# Patient Record
Sex: Female | Born: 1955 | Race: White | Hispanic: No | Marital: Married | Smoking: Never smoker
Health system: Southern US, Community
[De-identification: ages and names within clinical notes are randomized; demographics above are authoritative.]

## PROBLEM LIST (undated history)

## (undated) DIAGNOSIS — Z9221 Personal history of antineoplastic chemotherapy: Secondary | ICD-10-CM

## (undated) DIAGNOSIS — Z923 Personal history of irradiation: Secondary | ICD-10-CM

## (undated) DIAGNOSIS — G8929 Other chronic pain: Secondary | ICD-10-CM

## (undated) DIAGNOSIS — Z8489 Family history of other specified conditions: Secondary | ICD-10-CM

## (undated) DIAGNOSIS — A0472 Enterocolitis due to Clostridium difficile, not specified as recurrent: Secondary | ICD-10-CM

## (undated) DIAGNOSIS — E119 Type 2 diabetes mellitus without complications: Secondary | ICD-10-CM

## (undated) DIAGNOSIS — Z9889 Other specified postprocedural states: Secondary | ICD-10-CM

## (undated) DIAGNOSIS — G43909 Migraine, unspecified, not intractable, without status migrainosus: Secondary | ICD-10-CM

## (undated) DIAGNOSIS — M545 Low back pain, unspecified: Secondary | ICD-10-CM

## (undated) DIAGNOSIS — R1310 Dysphagia, unspecified: Secondary | ICD-10-CM

## (undated) DIAGNOSIS — J45909 Unspecified asthma, uncomplicated: Secondary | ICD-10-CM

## (undated) DIAGNOSIS — R011 Cardiac murmur, unspecified: Secondary | ICD-10-CM

## (undated) DIAGNOSIS — I480 Paroxysmal atrial fibrillation: Secondary | ICD-10-CM

## (undated) DIAGNOSIS — C50919 Malignant neoplasm of unspecified site of unspecified female breast: Secondary | ICD-10-CM

## (undated) DIAGNOSIS — E7800 Pure hypercholesterolemia, unspecified: Secondary | ICD-10-CM

## (undated) DIAGNOSIS — E1169 Type 2 diabetes mellitus with other specified complication: Secondary | ICD-10-CM

## (undated) DIAGNOSIS — R112 Nausea with vomiting, unspecified: Secondary | ICD-10-CM

## (undated) DIAGNOSIS — N1330 Unspecified hydronephrosis: Secondary | ICD-10-CM

## (undated) DIAGNOSIS — R4182 Altered mental status, unspecified: Secondary | ICD-10-CM

## (undated) DIAGNOSIS — B999 Unspecified infectious disease: Secondary | ICD-10-CM

## (undated) DIAGNOSIS — J42 Unspecified chronic bronchitis: Secondary | ICD-10-CM

## (undated) DIAGNOSIS — K219 Gastro-esophageal reflux disease without esophagitis: Secondary | ICD-10-CM

## (undated) DIAGNOSIS — R0682 Tachypnea, not elsewhere classified: Secondary | ICD-10-CM

## (undated) DIAGNOSIS — I5022 Chronic systolic (congestive) heart failure: Secondary | ICD-10-CM

## (undated) DIAGNOSIS — N200 Calculus of kidney: Secondary | ICD-10-CM

## (undated) DIAGNOSIS — I499 Cardiac arrhythmia, unspecified: Secondary | ICD-10-CM

## (undated) DIAGNOSIS — M1990 Unspecified osteoarthritis, unspecified site: Secondary | ICD-10-CM

## (undated) DIAGNOSIS — I509 Heart failure, unspecified: Secondary | ICD-10-CM

## (undated) DIAGNOSIS — N390 Urinary tract infection, site not specified: Secondary | ICD-10-CM

## (undated) DIAGNOSIS — G44209 Tension-type headache, unspecified, not intractable: Secondary | ICD-10-CM

## (undated) DIAGNOSIS — J189 Pneumonia, unspecified organism: Secondary | ICD-10-CM

## (undated) DIAGNOSIS — E669 Obesity, unspecified: Secondary | ICD-10-CM

## (undated) DIAGNOSIS — A419 Sepsis, unspecified organism: Secondary | ICD-10-CM

## (undated) HISTORY — DX: Type 2 diabetes mellitus with other specified complication: E11.69

## (undated) HISTORY — DX: Urinary tract infection, site not specified: N39.0

## (undated) HISTORY — DX: Paroxysmal atrial fibrillation: I48.0

## (undated) HISTORY — DX: Unspecified infectious disease: B99.9

## (undated) HISTORY — DX: Type 2 diabetes mellitus without complications: E11.9

## (undated) HISTORY — PX: KNEE ARTHROSCOPY: SUR90

## (undated) HISTORY — DX: Enterocolitis due to Clostridium difficile, not specified as recurrent: A04.72

## (undated) HISTORY — DX: Altered mental status, unspecified: R41.82

## (undated) HISTORY — DX: Tachypnea, not elsewhere classified: R06.82

## (undated) HISTORY — DX: Chronic systolic (congestive) heart failure: I50.22

## (undated) HISTORY — DX: Hypomagnesemia: E83.42

## (undated) HISTORY — PX: LITHOTRIPSY: SUR834

## (undated) HISTORY — DX: Unspecified hydronephrosis: N13.30

## (undated) HISTORY — PX: DIAGNOSTIC LAPAROSCOPY: SUR761

## (undated) HISTORY — DX: Type 2 diabetes mellitus with other specified complication: E66.9

## (undated) HISTORY — PX: COLONOSCOPY: SHX174

## (undated) HISTORY — DX: Dysphagia, unspecified: R13.10

---

## 1983-07-05 HISTORY — PX: TUBAL LIGATION: SHX77

## 1997-12-04 ENCOUNTER — Ambulatory Visit (HOSPITAL_COMMUNITY): Admission: RE | Admit: 1997-12-04 | Discharge: 1997-12-04 | Payer: Self-pay | Admitting: Urology

## 1998-08-27 ENCOUNTER — Ambulatory Visit (HOSPITAL_COMMUNITY): Admission: RE | Admit: 1998-08-27 | Discharge: 1998-08-27 | Payer: Self-pay | Admitting: Urology

## 1998-08-27 ENCOUNTER — Encounter: Payer: Self-pay | Admitting: Urology

## 1998-12-24 ENCOUNTER — Other Ambulatory Visit: Admission: RE | Admit: 1998-12-24 | Discharge: 1998-12-24 | Payer: Self-pay | Admitting: Gynecology

## 1999-05-25 ENCOUNTER — Encounter: Admission: RE | Admit: 1999-05-25 | Discharge: 1999-05-25 | Payer: Self-pay

## 2000-02-18 ENCOUNTER — Encounter: Admission: RE | Admit: 2000-02-18 | Discharge: 2000-02-18 | Payer: Self-pay

## 2000-10-03 ENCOUNTER — Encounter: Payer: Self-pay | Admitting: Emergency Medicine

## 2000-10-03 ENCOUNTER — Emergency Department (HOSPITAL_COMMUNITY): Admission: EM | Admit: 2000-10-03 | Discharge: 2000-10-03 | Payer: Self-pay | Admitting: Emergency Medicine

## 2001-01-23 ENCOUNTER — Other Ambulatory Visit: Admission: RE | Admit: 2001-01-23 | Discharge: 2001-01-23 | Payer: Self-pay | Admitting: Gynecology

## 2001-02-01 HISTORY — PX: BREAST LUMPECTOMY: SHX2

## 2001-02-01 HISTORY — PX: BREAST BIOPSY: SHX20

## 2001-02-19 ENCOUNTER — Encounter: Admission: RE | Admit: 2001-02-19 | Discharge: 2001-02-19 | Payer: Self-pay | Admitting: Gynecology

## 2001-02-19 ENCOUNTER — Encounter: Payer: Self-pay | Admitting: Gynecology

## 2001-02-21 ENCOUNTER — Encounter: Payer: Self-pay | Admitting: Gynecology

## 2001-02-21 ENCOUNTER — Encounter: Admission: RE | Admit: 2001-02-21 | Discharge: 2001-02-21 | Payer: Self-pay | Admitting: Gynecology

## 2001-03-01 ENCOUNTER — Encounter: Admission: RE | Admit: 2001-03-01 | Discharge: 2001-03-01 | Payer: Self-pay | Admitting: General Surgery

## 2001-03-01 ENCOUNTER — Encounter: Payer: Self-pay | Admitting: General Surgery

## 2001-03-02 ENCOUNTER — Encounter: Admission: RE | Admit: 2001-03-02 | Discharge: 2001-03-02 | Payer: Self-pay | Admitting: General Surgery

## 2001-03-02 ENCOUNTER — Encounter (INDEPENDENT_AMBULATORY_CARE_PROVIDER_SITE_OTHER): Payer: Self-pay | Admitting: *Deleted

## 2001-03-02 ENCOUNTER — Ambulatory Visit (HOSPITAL_BASED_OUTPATIENT_CLINIC_OR_DEPARTMENT_OTHER): Admission: RE | Admit: 2001-03-02 | Discharge: 2001-03-02 | Payer: Self-pay | Admitting: General Surgery

## 2001-03-02 ENCOUNTER — Encounter: Payer: Self-pay | Admitting: General Surgery

## 2001-03-07 ENCOUNTER — Emergency Department (HOSPITAL_COMMUNITY): Admission: EM | Admit: 2001-03-07 | Discharge: 2001-03-07 | Payer: Self-pay | Admitting: Emergency Medicine

## 2001-03-07 ENCOUNTER — Encounter: Payer: Self-pay | Admitting: Urology

## 2001-03-07 ENCOUNTER — Encounter: Admission: RE | Admit: 2001-03-07 | Discharge: 2001-03-07 | Payer: Self-pay | Admitting: Urology

## 2001-03-13 ENCOUNTER — Ambulatory Visit: Admission: RE | Admit: 2001-03-13 | Discharge: 2001-06-11 | Payer: Self-pay | Admitting: *Deleted

## 2001-03-29 ENCOUNTER — Ambulatory Visit (HOSPITAL_BASED_OUTPATIENT_CLINIC_OR_DEPARTMENT_OTHER): Admission: RE | Admit: 2001-03-29 | Discharge: 2001-03-29 | Payer: Self-pay | Admitting: General Surgery

## 2001-03-29 ENCOUNTER — Encounter: Payer: Self-pay | Admitting: General Surgery

## 2001-07-12 ENCOUNTER — Encounter: Admission: RE | Admit: 2001-07-12 | Discharge: 2001-07-12 | Payer: Self-pay | Admitting: General Surgery

## 2001-07-12 ENCOUNTER — Encounter: Payer: Self-pay | Admitting: General Surgery

## 2001-07-26 ENCOUNTER — Ambulatory Visit: Admission: RE | Admit: 2001-07-26 | Discharge: 2001-10-24 | Payer: Self-pay | Admitting: *Deleted

## 2001-08-21 ENCOUNTER — Encounter: Admission: RE | Admit: 2001-08-21 | Discharge: 2001-08-21 | Payer: Self-pay | Admitting: Hematology & Oncology

## 2001-08-21 ENCOUNTER — Encounter: Payer: Self-pay | Admitting: Hematology & Oncology

## 2001-08-24 ENCOUNTER — Ambulatory Visit (HOSPITAL_BASED_OUTPATIENT_CLINIC_OR_DEPARTMENT_OTHER): Admission: RE | Admit: 2001-08-24 | Discharge: 2001-08-24 | Payer: Self-pay | Admitting: General Surgery

## 2001-11-28 ENCOUNTER — Encounter: Payer: Self-pay | Admitting: General Surgery

## 2001-11-28 ENCOUNTER — Encounter: Admission: RE | Admit: 2001-11-28 | Discharge: 2001-11-28 | Payer: Self-pay | Admitting: General Surgery

## 2002-01-28 ENCOUNTER — Other Ambulatory Visit: Admission: RE | Admit: 2002-01-28 | Discharge: 2002-01-28 | Payer: Self-pay | Admitting: Gynecology

## 2002-02-26 ENCOUNTER — Encounter: Payer: Self-pay | Admitting: General Surgery

## 2002-02-26 ENCOUNTER — Encounter: Admission: RE | Admit: 2002-02-26 | Discharge: 2002-02-26 | Payer: Self-pay | Admitting: General Surgery

## 2002-07-15 ENCOUNTER — Encounter: Admission: RE | Admit: 2002-07-15 | Discharge: 2002-07-15 | Payer: Self-pay | Admitting: General Surgery

## 2002-07-15 ENCOUNTER — Encounter: Payer: Self-pay | Admitting: General Surgery

## 2002-10-16 ENCOUNTER — Encounter: Admission: RE | Admit: 2002-10-16 | Discharge: 2002-10-16 | Payer: Self-pay | Admitting: General Surgery

## 2002-10-16 ENCOUNTER — Encounter: Payer: Self-pay | Admitting: General Surgery

## 2003-02-17 ENCOUNTER — Other Ambulatory Visit: Admission: RE | Admit: 2003-02-17 | Discharge: 2003-02-17 | Payer: Self-pay | Admitting: Gynecology

## 2003-03-05 ENCOUNTER — Encounter: Payer: Self-pay | Admitting: General Surgery

## 2003-03-05 ENCOUNTER — Encounter: Admission: RE | Admit: 2003-03-05 | Discharge: 2003-03-05 | Payer: Self-pay | Admitting: General Surgery

## 2003-03-12 ENCOUNTER — Encounter (HOSPITAL_COMMUNITY): Admission: RE | Admit: 2003-03-12 | Discharge: 2003-06-10 | Payer: Self-pay | Admitting: General Surgery

## 2003-03-12 ENCOUNTER — Encounter: Payer: Self-pay | Admitting: General Surgery

## 2003-06-12 ENCOUNTER — Observation Stay (HOSPITAL_COMMUNITY): Admission: AD | Admit: 2003-06-12 | Discharge: 2003-06-13 | Payer: Self-pay | Admitting: Gynecology

## 2003-06-12 ENCOUNTER — Encounter (INDEPENDENT_AMBULATORY_CARE_PROVIDER_SITE_OTHER): Payer: Self-pay | Admitting: Specialist

## 2003-06-12 ENCOUNTER — Ambulatory Visit (HOSPITAL_BASED_OUTPATIENT_CLINIC_OR_DEPARTMENT_OTHER): Admission: RE | Admit: 2003-06-12 | Discharge: 2003-06-12 | Payer: Self-pay | Admitting: Gynecology

## 2003-07-05 DIAGNOSIS — A419 Sepsis, unspecified organism: Secondary | ICD-10-CM

## 2003-07-05 HISTORY — DX: Sepsis, unspecified organism: A41.9

## 2003-11-10 ENCOUNTER — Emergency Department (HOSPITAL_COMMUNITY): Admission: EM | Admit: 2003-11-10 | Discharge: 2003-11-10 | Payer: Self-pay | Admitting: Emergency Medicine

## 2004-04-22 ENCOUNTER — Encounter: Admission: RE | Admit: 2004-04-22 | Discharge: 2004-04-22 | Payer: Self-pay | Admitting: Hematology & Oncology

## 2004-06-08 ENCOUNTER — Other Ambulatory Visit: Admission: RE | Admit: 2004-06-08 | Discharge: 2004-06-08 | Payer: Self-pay | Admitting: Gynecology

## 2004-07-04 HISTORY — PX: BILATERAL OOPHORECTOMY: SHX1221

## 2004-10-15 ENCOUNTER — Ambulatory Visit: Payer: Self-pay | Admitting: Hematology & Oncology

## 2004-12-28 ENCOUNTER — Ambulatory Visit: Payer: Self-pay | Admitting: Hematology & Oncology

## 2005-01-17 ENCOUNTER — Encounter: Admission: RE | Admit: 2005-01-17 | Discharge: 2005-01-17 | Payer: Self-pay | Admitting: Unknown Physician Specialty

## 2005-01-18 ENCOUNTER — Encounter: Admission: RE | Admit: 2005-01-18 | Discharge: 2005-01-18 | Payer: Self-pay | Admitting: Unknown Physician Specialty

## 2005-04-25 ENCOUNTER — Encounter: Admission: RE | Admit: 2005-04-25 | Discharge: 2005-04-25 | Payer: Self-pay | Admitting: General Surgery

## 2005-04-25 ENCOUNTER — Encounter: Admission: RE | Admit: 2005-04-25 | Discharge: 2005-04-25 | Payer: Self-pay | Admitting: Hematology & Oncology

## 2005-06-28 ENCOUNTER — Ambulatory Visit: Payer: Self-pay | Admitting: Hematology & Oncology

## 2005-08-31 ENCOUNTER — Other Ambulatory Visit: Admission: RE | Admit: 2005-08-31 | Discharge: 2005-08-31 | Payer: Self-pay | Admitting: Gynecology

## 2006-04-27 ENCOUNTER — Encounter: Admission: RE | Admit: 2006-04-27 | Discharge: 2006-04-27 | Payer: Self-pay | Admitting: General Surgery

## 2006-10-26 ENCOUNTER — Other Ambulatory Visit: Admission: RE | Admit: 2006-10-26 | Discharge: 2006-10-26 | Payer: Self-pay | Admitting: Gynecology

## 2007-03-02 ENCOUNTER — Ambulatory Visit: Payer: Self-pay | Admitting: Hematology & Oncology

## 2007-03-07 LAB — CBC WITH DIFFERENTIAL/PLATELET
Eosinophils Absolute: 0.2 10*3/uL (ref 0.0–0.5)
LYMPH%: 26.7 % (ref 14.0–48.0)
MCHC: 35.6 g/dL (ref 32.0–36.0)
MONO#: 0.4 10*3/uL (ref 0.1–0.9)
MONO%: 5.2 % (ref 0.0–13.0)
NEUT%: 64.7 % (ref 39.6–76.8)
Platelets: 251 10*3/uL (ref 145–400)
RBC: 4.03 10*6/uL (ref 3.70–5.32)
WBC: 6.8 10*3/uL (ref 3.9–10.0)

## 2007-03-07 LAB — COMPREHENSIVE METABOLIC PANEL
Alkaline Phosphatase: 58 U/L (ref 39–117)
BUN: 20 mg/dL (ref 6–23)
Calcium: 9.6 mg/dL (ref 8.4–10.5)
Glucose, Bld: 211 mg/dL — ABNORMAL HIGH (ref 70–99)
Potassium: 4.3 mEq/L (ref 3.5–5.3)

## 2007-06-14 ENCOUNTER — Encounter: Admission: RE | Admit: 2007-06-14 | Discharge: 2007-06-14 | Payer: Self-pay | Admitting: Hematology & Oncology

## 2008-02-05 ENCOUNTER — Ambulatory Visit: Payer: Self-pay | Admitting: Hematology & Oncology

## 2008-03-13 LAB — COMPREHENSIVE METABOLIC PANEL
AST: 21 U/L (ref 0–37)
Calcium: 9.5 mg/dL (ref 8.4–10.5)
Creatinine, Ser: 0.66 mg/dL (ref 0.40–1.20)
Total Bilirubin: 0.5 mg/dL (ref 0.3–1.2)

## 2008-03-13 LAB — CBC WITH DIFFERENTIAL (CANCER CENTER ONLY)
BASO%: 0.5 % (ref 0.0–2.0)
EOS%: 2 % (ref 0.0–7.0)
HCT: 36.5 % (ref 34.8–46.6)
LYMPH#: 2 10*3/uL (ref 0.9–3.3)
LYMPH%: 27.7 % (ref 14.0–48.0)
MCHC: 33.8 g/dL (ref 32.0–36.0)
MCV: 91 fL (ref 81–101)
NEUT%: 66.2 % (ref 39.6–80.0)
RDW: 12.5 % (ref 10.5–14.6)

## 2008-09-19 ENCOUNTER — Encounter: Admission: RE | Admit: 2008-09-19 | Discharge: 2008-09-19 | Payer: Self-pay | Admitting: Hematology & Oncology

## 2009-03-12 ENCOUNTER — Ambulatory Visit: Payer: Self-pay | Admitting: Hematology & Oncology

## 2009-03-13 LAB — CBC WITH DIFFERENTIAL (CANCER CENTER ONLY)
BASO%: 0.5 % (ref 0.0–2.0)
HCT: 39 % (ref 34.8–46.6)
LYMPH%: 33.9 % (ref 14.0–48.0)
MCH: 31.6 pg (ref 26.0–34.0)
MCV: 91 fL (ref 81–101)
MONO#: 0.3 10*3/uL (ref 0.1–0.9)
NEUT%: 58.3 % (ref 39.6–80.0)
RDW: 11.7 % (ref 10.5–14.6)
WBC: 5.8 10*3/uL (ref 3.9–10.0)

## 2009-03-13 LAB — COMPREHENSIVE METABOLIC PANEL
ALT: 61 U/L — ABNORMAL HIGH (ref 0–35)
AST: 28 U/L (ref 0–37)
Albumin: 4.3 g/dL (ref 3.5–5.2)
Calcium: 9.8 mg/dL (ref 8.4–10.5)
Creatinine, Ser: 0.83 mg/dL (ref 0.40–1.20)
Glucose, Bld: 207 mg/dL — ABNORMAL HIGH (ref 70–99)
Total Bilirubin: 0.6 mg/dL (ref 0.3–1.2)

## 2009-03-19 ENCOUNTER — Encounter: Admission: RE | Admit: 2009-03-19 | Discharge: 2009-03-19 | Payer: Self-pay | Admitting: Hematology & Oncology

## 2009-10-15 ENCOUNTER — Encounter: Admission: RE | Admit: 2009-10-15 | Discharge: 2009-10-15 | Payer: Self-pay | Admitting: Gynecology

## 2010-03-11 ENCOUNTER — Ambulatory Visit: Payer: Self-pay | Admitting: Hematology & Oncology

## 2010-03-12 LAB — COMPREHENSIVE METABOLIC PANEL
ALT: 26 U/L (ref 0–35)
Alkaline Phosphatase: 57 U/L (ref 39–117)
BUN: 16 mg/dL (ref 6–23)
Creatinine, Ser: 0.7 mg/dL (ref 0.40–1.20)
Potassium: 3.6 mEq/L (ref 3.5–5.3)

## 2010-03-12 LAB — CBC WITH DIFFERENTIAL (CANCER CENTER ONLY)
EOS%: 2.2 % (ref 0.0–7.0)
Eosinophils Absolute: 0.2 10*3/uL (ref 0.0–0.5)
HGB: 12.1 g/dL (ref 11.6–15.9)
MCH: 31.3 pg (ref 26.0–34.0)
MONO#: 0.3 10*3/uL (ref 0.1–0.9)
NEUT#: 5.8 10*3/uL (ref 1.5–6.5)
NEUT%: 69.2 % (ref 39.6–80.0)
Platelets: 255 10*3/uL (ref 145–400)
RBC: 3.86 10*6/uL (ref 3.70–5.32)
WBC: 8.4 10*3/uL (ref 3.9–10.0)

## 2010-07-25 ENCOUNTER — Encounter: Payer: Self-pay | Admitting: Hematology & Oncology

## 2010-10-14 ENCOUNTER — Other Ambulatory Visit: Payer: Self-pay | Admitting: Family Medicine

## 2010-10-14 DIAGNOSIS — Z853 Personal history of malignant neoplasm of breast: Secondary | ICD-10-CM

## 2010-11-04 ENCOUNTER — Ambulatory Visit
Admission: RE | Admit: 2010-11-04 | Discharge: 2010-11-04 | Disposition: A | Discharge status: Home / Self Care | Payer: Federal, State, Local not specified - PPO | Source: Ambulatory Visit | Attending: Family Medicine | Admitting: Family Medicine

## 2010-11-04 DIAGNOSIS — Z853 Personal history of malignant neoplasm of breast: Secondary | ICD-10-CM

## 2010-11-19 NOTE — Op Note (Signed)
[place]. [facility]  Patient:    [name], [name] Visit Number: [id number] MRN: [id number]          Service Type: EMS Location: ED Attending Physician:  Gelene Mink Dictated by:   Edsel Petrin. Dalbert Batman, M.D. Proc. Date: 03/29/01 Admit Date:  03/07/2001 Discharge Date: 03/07/2001   CC:         Collier Salina R. Marin Olp, M.D.  Micheline Chapman, M.D.   Operative Report  PREOPERATIVE DIAGNOSIS:  Breast cancer.  POSTOPERATIVE DIAGNOSIS:  Breast cancer.  PROCEDURE:  Insertion of LifePort venous vascular access device.  SURGEON:  Edsel Petrin. Dalbert Batman, M.D.  PREOPERATIVE INDICATIONS:  This is a 55 year old white female who has recently undergone partial mastectomy and a sentinel lymph node mapping and biopsy on the left side for invasive ductal carcinoma, stage T1c, N0.  Chemotherapy has been recommended by Dr. Burney Gauze.  Radiation therapy has been recommended by Dr. Shelby Dubin.  Dr. Marin Olp asked for a Port-a-Cath, and we are operating on her today for that procedure.  OPERATIVE TECHNIQUE:  The patient was placed supine on the operating table with a roll behind her shoulders and her arms at her sides.  She was monitored and sedated by the anesthesia department.  The neck and chest was prepped and draped in a sterile fashion.  Xylocaine, 1%, with epinephrine was used as a local infiltration anesthetic.  A right subclavian venipuncture was performed and with a single pass of the needle, the vein was entered and a guide wire inserted into the superior vena cava under fluoroscopic guidance.  A small incision was made at this site.  A transverse incision was made about three fingerbreadths below the clavicle.  Dissection was carried down into the subcutaneous tissue.  The tissues were quite thick and so we chose to place the port in the subcutaneous tissue rather than on the fascia.  A pocket was created in the subcutaneous tissue.  The catheter was brought  into the operative field and measured and cut an appropriate length.  The catheter was drawn through a subcutaneous tunnel and secured to the port with a locking device.  The port and catheter were flushed with heparinized saline.  The port was sutured to the subcutaneous tissue with four interrupted sutures of 2-0 Prolene.  The dilator and peel-away sheath were then inserted over the guide wire into the central venous circulation.  The guide wire and ______ were removed.  We had good blood return.  The catheter was inserted through the peel-away sheath into the superior vena cava, and then the peel-away sheath was removed.  We had excellent blood flow and excellent blood return.  The port was flushed with heparinized saline.  Fluoroscopy confirmed the tip of the catheter was in the superior vena cava or perhaps in the right atrium. There was no kink or crimping of the catheter anywhere.  The subcutaneous tissue was closed with 3-0 Vicryl sutures.  The skin incision was closed with subcuticular sutures of 4-0 Vicryl and Steri-Strips.  An angled Huber needle secured to an IV extension tubing was brought into the operative field and flushed with heparinized saline.  This was then passed through the skin into the port where again we had excellent blood return.  We then flushed this with concentrated heparin solution.  This was secured with Steri-Strips and Opsite bandage and was clamped and capped off.  This will be used for chemotherapy tomorrow.  The patient tolerated the procedure well and  was taken to the recovery room in stable condition.  A chest x-ray was obtained in the recovery room.  She tolerated the procedure well.  Sponge, needle, and instrument counts were correct.  There were no apparent complications. Dictated by:   Edsel Petrin. Dalbert Batman, M.D. Attending Physician:  Gelene Mink DD:  03/29/01 TD:  03/29/01 Job: [id number]

## 2010-11-19 NOTE — Op Note (Signed)
NAME:  [name], [name]                      ACCOUNT NO.:  [id number]   MEDICAL RECORD NO.:  [id number]                   PATIENT TYPE:  AMB   LOCATION:  [facility]                                 FACILITY:  [facility]   PHYSICIAN:  Selinda Orion, M.D.              DATE OF BIRTH:  [birth year]   DATE OF PROCEDURE:  06/12/2003  DATE OF DISCHARGE:                                 OPERATIVE REPORT   PREOPERATIVE DIAGNOSES:  1. Breast cancer, estrogen-dependent.  2. Intolerant of Tamoxifen on aromatase inhibitor and Depo-Lupron.  3. Oophorectomy alternative to Depo-Lupron.   POSTOPERATIVE DIAGNOSES:  1. Breast cancer, estrogen-dependent.  2. Intolerant of Tamoxifen on aromatase inhibitor and Depo-Lupron.  3. Oophorectomy alternative to Depo-Lupron.   PROCEDURE:  Laparoscopic bilateral salpingo-oophorectomy with EndoCatch  removal of the ovaries.   SURGEON:  Selinda Orion, M.D.   ANESTHESIA:  General orotracheal.   DESCRIPTION OF PROCEDURE:  Under excellent anesthesia as above with the  patient's abdomen prepped and draped as a sterile field with a Hulka  tenaculum applied to her cervix and bladder drained, a subumbilical incision  was made and the Veress cannula introduced.  After adequate pneumoperitoneum  the laparoscopic trocar was introduced and pelvic organs visualized.  Both  ovaries were normal in appearance.  They were slightly suppressed, though  not completely suppressed in appearance.  Both remained quite sizable.  At  this point careful examination revealed previous tubal cautery with  varicosities in the mesosalpinx.  Two separate port sites were introduced in  each lower quadrant under direct vision with careful avoidance of vessels  and structures.  Through these ports the grasper was introduced and the  ovary retracted.  A tripolar cautery forceps was then used and the pedicles  transected progressively, first infundibulopelvic on each side, then the  ovarian ligaments.   Once the ovaries were removed they were placed in the  cul-de-sac and then later retrieved and placed in EndoCatch bags and removed  individually through the umbilical port site.  The port site was enlarged by  Metzenbaum scissors prior to removal.  At this point the pedicles were  reexamined and there was no evidence of bleeding and reduced pressure.  The  pelvis was irrigated to remove debris and left field with approximately 500-  1000 mL of lactated Ringer's.  The umbilical incision was closed with deep  suture of 0 Vicryl UR6 needle with three individual sutures placed in the  fascia directly.  The  subcutaneous tissues were approximated with interrupted sutures of 5-0  Vicryl.  The skin incisions were infiltrated with Marcaine 0.5%.  At this  point the skin surface was closed with Steri-Strips.  The patient returned  to the recovery room in excellent condition without complication.  Selinda Orion, M.D.    CWL/MEDQ  D:  06/12/2003  T:  06/12/2003  Job:  [id number]   cc:   Micheline Chapman, M.D.  [address]  [place]  [place] [address]  Fax: [phone]   Dalbert Batman, M.D.   Rudell Cobb. Marin Olp, M.D.  [address]  [place], [place] [address]  Fax: [phone]

## 2010-11-19 NOTE — Op Note (Signed)
[place]. [facility]  Patient:    [name], JAGOW Visit Number: [id number] MRN: [id number]          Service Type: DSU Location: [facility] Attending Physician:  Barbera Setters Dictated by:   Edsel Petrin. Dalbert Batman, M.D. Proc. Date: 03/02/01 Admit Date:  03/02/2001 Discharge Date: 03/02/2001   CC:         Micheline Chapman, M.D.  Selinda Orion, M.D.   Operative Report  PREOPERATIVE DIAGNOSIS:  Carcinoma of the left breast.  POSTOPERATIVE DIAGNOSIS: Carcinoma of the left breast.  OPERATION PERFORMED: 1. Injection of blue dye. 2. Left sentinel lymph node mapping and biopsy. 3. Left partial mastectomy with needle localization and specimen    mammography.  SURGEON:  Edsel Petrin. Dalbert Batman, M.D.  INDICATIONS:  This is a 55 year old white female who underwent routine mammograms recently.  Abnormality was found in the upper outer quadrant of the left breast with an ill-defined spiculated mass measuring 0.8 x 0.8 cm.  Core biopsy performed on 02/21/01 showed invasive ductal carcinoma.  The patient was counseled regarding options of management of her breast cancer.  She was highly motivated towards breast conservation.  I felt that she was an excellent candidate for this.  She underwent needle localization at the breast center this morning and also underwent injection of her breast in the nuclear medicine department of [facility] this morning and is brought to the emergency room electively.  DESCRIPTION OF PROCEDURE:  Following the induction of general endotracheal anesthesia, the patients left breast and left axilla were prepped and draped in a sterile fashion.  Prior to prepping the breast, I did inject 5 cc of Lymphazurin blue dye underneath the areola and four different locations.  The breast was then massaged for five minutes prior to a Hibiclens prep.  The NeoProbe was brought to the operative field.  We had excellent drop-off in radioactivity  between the subareolar area and the axilla.  The breast cancer was also high in the upper outer quadrant of the left breast.  We made a transverse incision in a skin crease high in the left axilla just above the area of greatest radioactivity.  Dissection was carried down through the subcutaneous tissue.  The clavipectoral fascia was incised and we entered the level 1 axilla.  Using the NeoProbe, we were able to very easily map out, identify and remove two sentinel lymph nodes.  Both of these were extremely hot with radioactivity.  One of them was very blue and one of them was partially blue.  Small bleeders and lymphatics were controlled with metal clips.  Imprint cytology was performed by Dr. Randon Goldsmith.  Dr. Randon Goldsmith reported that both nodes were negative for cancer cells.  We made a separate incision in general parallel to the first, about 3-4 cm inferiorly and medially toward the nipple, overlying the markings on the breast where the tumor is supposed to be.  Dissection was carried down circumferentially and we removed an area of breast tissue approximately 4 x 5 cm which completely surrounded the tip of the localizing wire.  Specimen mammogram was obtained and showed that the tumor was completely contained within the specimen with excellent margins on all sides.  The specimen was marked with silk sutures to orient the pathologist to the superficial, deep, superior and medial margins.  The wound was irrigated with saline.  Hemostasis was excellent and achieved with electrocautery.  Both incisions were closed with running subcuticular sutures of 4-0 Vicryl and Steri-Strips.  Kling bandages were placed and the patient taken to the recovery room in stable condition.  Estimated blood loss was about 30-40 cc.  No complications.  Sponge, needle and instrument counts were correct. Dictated by:   Edsel Petrin. Dalbert Batman, M.D. Attending Physician:  Barbera Setters DD:  03/02/01 TD:   03/03/01 Job: [id number]

## 2010-11-19 NOTE — Op Note (Signed)
[place]  Patient:    [name], [name] Visit Number: [id number] MRN: [id number]          Service Type: DSU Location: [facility] Attending Physician:  Barbera Setters Dictated by:   Edsel Petrin. Dalbert Batman, M.D. Proc. Date: 08/24/01 Admit Date:  08/24/2001 Discharge Date: 08/24/2001                             Operative Report  PREOPERATIVE DIAGNOSIS:  Breast cancer with indwelling Port-A-Cath.  POSTOPERATIVE DIAGNOSIS:  Breast cancer with indwelling Port-A-Cath.  PROCEDURE:  Removal of Port-A-Cath.  SURGEON:  Edsel Petrin. Dalbert Batman, M.D.  OPERATIVE INDICATIONS:  This is a 55 year old white female who underwent left partial mastectomy and left sentinel node biopsy on March 02, 2001, for invasive ductal carcinoma, stage T1c, N0.  She has completed her chemotherapy and is getting ready to proceed with radiation therapy.  She was sent back to me by Dr. Burney Gauze with a request to remove the Port-A-Cath.  DESCRIPTION OF PROCEDURE:  The patient was brought to the minor procedure room at [facility] day surgery center.  She was placed supine on the operating table. The right parasternal area was prepped and draped in a sterile fashion. Xylocaine 1% with epinephrine was used as a local infiltration anesthetic.  A transverse incision was made through the previous scar overlying the port. The port was encountered, the capsule was entered.  We elevated the port and divided all of the Prolene sutures and then further dissected out the catheter assembly and then removed the port and the catheter completely intact.  The wound was clean.  There was no bleeding.  The subcutaneous tissue was closed with interrupted sutures of 3-0 Vicryl and the skin was closed with a running subcuticular suture of 4-0 Vicryl and Steri-Strips.  Clean bandages were placed and the patient taken to the recovery room in stable condition.  The estimated blood loss was about 5 cc.   Complications:  None.  Sponge, needle, and instrument counts were correct. Dictated by:   Edsel Petrin. Dalbert Batman, M.D. Attending Physician:  Barbera Setters DD:  08/24/01 TD:  08/24/01 Job: [id number]

## 2011-03-17 ENCOUNTER — Other Ambulatory Visit: Payer: Self-pay | Admitting: Hematology & Oncology

## 2011-03-17 ENCOUNTER — Encounter (HOSPITAL_BASED_OUTPATIENT_CLINIC_OR_DEPARTMENT_OTHER): Payer: Federal, State, Local not specified - PPO | Admitting: Hematology & Oncology

## 2011-03-17 DIAGNOSIS — Z170 Estrogen receptor positive status [ER+]: Secondary | ICD-10-CM

## 2011-03-17 DIAGNOSIS — C50419 Malignant neoplasm of upper-outer quadrant of unspecified female breast: Secondary | ICD-10-CM

## 2011-03-17 LAB — CBC WITH DIFFERENTIAL (CANCER CENTER ONLY)
BASO%: 0.5 % (ref 0.0–2.0)
EOS%: 2.4 % (ref 0.0–7.0)
LYMPH#: 1.8 10*3/uL (ref 0.9–3.3)
MCHC: 36 g/dL (ref 32.0–36.0)
NEUT#: 3.8 10*3/uL (ref 1.5–6.5)
RDW: 12.8 % (ref 11.1–15.7)

## 2011-03-18 LAB — COMPREHENSIVE METABOLIC PANEL
AST: 21 U/L (ref 0–37)
Albumin: 4.7 g/dL (ref 3.5–5.2)
Alkaline Phosphatase: 55 U/L (ref 39–117)
Potassium: 4.2 mEq/L (ref 3.5–5.3)
Sodium: 143 mEq/L (ref 135–145)
Total Protein: 7.3 g/dL (ref 6.0–8.3)

## 2011-05-19 DIAGNOSIS — N3020 Other chronic cystitis without hematuria: Secondary | ICD-10-CM

## 2011-05-19 DIAGNOSIS — N209 Urinary calculus, unspecified: Secondary | ICD-10-CM

## 2011-05-19 HISTORY — DX: Other chronic cystitis without hematuria: N30.20

## 2011-05-19 HISTORY — DX: Urinary calculus, unspecified: N20.9

## 2011-09-28 ENCOUNTER — Other Ambulatory Visit: Payer: Self-pay | Admitting: Family Medicine

## 2011-09-28 DIAGNOSIS — Z1231 Encounter for screening mammogram for malignant neoplasm of breast: Secondary | ICD-10-CM

## 2011-11-08 ENCOUNTER — Ambulatory Visit
Admission: RE | Admit: 2011-11-08 | Discharge: 2011-11-08 | Disposition: A | Discharge status: Home / Self Care | Payer: Federal, State, Local not specified - PPO | Source: Ambulatory Visit | Attending: Family Medicine | Admitting: Family Medicine

## 2011-11-08 DIAGNOSIS — Z1231 Encounter for screening mammogram for malignant neoplasm of breast: Secondary | ICD-10-CM

## 2012-03-08 ENCOUNTER — Telehealth: Payer: Self-pay | Admitting: Hematology & Oncology

## 2012-03-08 NOTE — Telephone Encounter (Signed)
Patient called and sch yearly follow up in October

## 2012-04-17 ENCOUNTER — Emergency Department (HOSPITAL_COMMUNITY)
Admission: EM | Admit: 2012-04-17 | Discharge: 2012-04-17 | Disposition: A | Discharge status: Home / Self Care | Payer: Federal, State, Local not specified - PPO | Attending: Emergency Medicine | Admitting: Emergency Medicine

## 2012-04-17 ENCOUNTER — Emergency Department (HOSPITAL_COMMUNITY): Payer: Federal, State, Local not specified - PPO

## 2012-04-17 ENCOUNTER — Encounter (HOSPITAL_COMMUNITY): Payer: Self-pay | Admitting: Emergency Medicine

## 2012-04-17 DIAGNOSIS — R112 Nausea with vomiting, unspecified: Secondary | ICD-10-CM

## 2012-04-17 DIAGNOSIS — R109 Unspecified abdominal pain: Secondary | ICD-10-CM | POA: Insufficient documentation

## 2012-04-17 DIAGNOSIS — C50919 Malignant neoplasm of unspecified site of unspecified female breast: Secondary | ICD-10-CM | POA: Insufficient documentation

## 2012-04-17 DIAGNOSIS — E119 Type 2 diabetes mellitus without complications: Secondary | ICD-10-CM | POA: Insufficient documentation

## 2012-04-17 HISTORY — DX: Malignant neoplasm of unspecified site of unspecified female breast: C50.919

## 2012-04-17 LAB — CBC WITH DIFFERENTIAL/PLATELET
Basophils Absolute: 0.1 10*3/uL (ref 0.0–0.1)
Eosinophils Absolute: 0.1 10*3/uL (ref 0.0–0.7)
Eosinophils Relative: 1 % (ref 0–5)
Lymphocytes Relative: 19 % (ref 12–46)
Lymphs Abs: 1.9 10*3/uL (ref 0.7–4.0)
MCH: 31.1 pg (ref 26.0–34.0)
MCV: 90 fL (ref 78.0–100.0)
Neutrophils Relative %: 76 % (ref 43–77)
Platelets: 220 10*3/uL (ref 150–400)
RBC: 4.12 MIL/uL (ref 3.87–5.11)
RDW: 13.7 % (ref 11.5–15.5)
WBC: 10.3 10*3/uL (ref 4.0–10.5)

## 2012-04-17 LAB — BASIC METABOLIC PANEL
Calcium: 9.7 mg/dL (ref 8.4–10.5)
GFR calc non Af Amer: >90 mL/min (ref >90)
Glucose, Bld: 204 mg/dL — ABNORMAL HIGH (ref 70–99)
Potassium: 3.5 mEq/L (ref 3.5–5.1)
Sodium: 140 mEq/L (ref 135–145)

## 2012-04-17 LAB — URINALYSIS, ROUTINE W REFLEX MICROSCOPIC
Leukocytes, UA: NEGATIVE
Nitrite: NEGATIVE
Protein, ur: 30 mg/dL — AB
Specific Gravity, Urine: 1.028 (ref 1.005–1.030)
Urobilinogen, UA: 0.2 mg/dL (ref 0.0–1.0)

## 2012-04-17 LAB — URINE MICROSCOPIC-ADD ON

## 2012-04-17 MED ORDER — ONDANSETRON HCL 4 MG PO TABS: 4.0000 mg | ORAL_TABLET | Freq: Four times a day (QID) | ORAL | Status: DC

## 2012-04-17 MED ORDER — SODIUM CHLORIDE 0.9 % IV BOLUS (SEPSIS)
1000.0000 mL | Freq: Once | INTRAVENOUS | Status: AC
  Administered 2012-04-17: 1000 mL via INTRAVENOUS

## 2012-04-17 MED ORDER — ACETAMINOPHEN 325 MG PO TABS
650.0000 mg | ORAL_TABLET | Freq: Once | ORAL | Status: AC
  Administered 2012-04-17: 650 mg via ORAL
  Filled 2012-04-17: qty 2

## 2012-04-17 MED ORDER — ONDANSETRON 4 MG PO TBDP
ORAL_TABLET | ORAL | Status: AC
  Filled 2012-04-17: qty 2

## 2012-04-17 MED ORDER — ONDANSETRON 4 MG PO TBDP
8.0000 mg | ORAL_TABLET | Freq: Once | ORAL | Status: AC
  Administered 2012-04-17: 8 mg via ORAL

## 2012-04-17 NOTE — ED Notes (Signed)
Pt here for N/V and not feeling well with flank pain; pt sts on antibiotics and started to improve but now feeling worse

## 2012-04-17 NOTE — ED Provider Notes (Signed)
History     CSN: [id number]  Arrival date & time 04/17/12  1459   First MD Initiated Contact with Patient 04/17/12 1725      Chief Complaint  Patient presents with  . Flank Pain  . Emesis    (Consider location/radiation/quality/duration/timing/severity/associated sxs/prior treatment) Patient is a 56 y.o. female presenting with flank pain and vomiting. The history is provided by the patient.  Flank Pain This is a new problem. Associated symptoms include nausea and vomiting. Pertinent negatives include no chills or fever. Associated symptoms comments: She reports going to her PCP 6 days ago with symptoms of fever, and discomfort in the left lateral chest wall in lower portion of chest that extends to flank. No cough, SOB, dysuria, hematuria at that time or presently. She does have a history of kidney stones. She reports that her doctor said she had evidence of infection in her bloodwork but had a normal urine. She has been on Levaquin since. Tonight, she presents complaining of persistent left side and flank pain with onset nausea and vomiting today. No fever in 2-3 days. .  Emesis  Pertinent negatives include no chills, no diarrhea and no fever.    Past Medical History  Diagnosis Date  . Diabetes mellitus without complication   . Breast cancer     History reviewed. No pertinent past surgical history.  History reviewed. No pertinent family history.  History  Substance Use Topics  . Smoking status: Never Smoker   . Smokeless tobacco: Not on file  . Alcohol Use: No    OB History    Grav Para Term Preterm Abortions TAB SAB Ect Mult Living                  Review of Systems  Constitutional: Negative for fever and chills.  HENT: Negative.   Respiratory: Negative.  Negative for shortness of breath.   Cardiovascular:       See HPI.  Gastrointestinal: Positive for nausea and vomiting. Negative for diarrhea.       Today, the pain in flank did radiate into LLQ abdomen.    Genitourinary: Positive for flank pain. Negative for dysuria and hematuria.  Musculoskeletal: Negative.   Skin: Negative.   Neurological: Negative.     Allergies  Cleocin; Ivp dye; Morphine and related; and Pneumococcal vaccines  Home Medications   Current Outpatient Rx  Name Route Sig Dispense Refill  . GLIPIZIDE ER 5 MG PO TB24 Oral Take 5 mg by mouth daily.    Marland Kitchen LISINOPRIL 10 MG PO TABS Oral Take 10 mg by mouth daily.    Marland Kitchen METFORMIN HCL 1000 MG PO TABS Oral Take 1,000 mg by mouth 2 (two) times daily with a meal.    . ROSUVASTATIN CALCIUM 10 MG PO TABS Oral Take 10 mg by mouth at bedtime.    Marland Kitchen SITAGLIPTIN PHOSPHATE 100 MG PO TABS Oral Take 100 mg by mouth daily.      BP 144/81  Pulse 83  Temp 98.5 F (36.9 C) (Oral)  Resp 18  SpO2 97%  Physical Exam  Constitutional: She is oriented to person, place, and time. She appears well-developed and well-nourished.  HENT:  Head: Normocephalic.  Neck: Normal range of motion. Neck supple.  Cardiovascular: Normal rate and regular rhythm.   No murmur heard. Pulmonary/Chest: Effort normal and breath sounds normal. She has no wheezes. She has no rales.  Abdominal: Soft. Bowel sounds are normal. There is tenderness. There is no rebound and no guarding.  Mild LLQ tenderness.  Genitourinary:       Mild left flank tenderness.   Musculoskeletal: Normal range of motion.  Neurological: She is alert and oriented to person, place, and time.  Skin: Skin is warm and dry. No rash noted.  Psychiatric: She has a normal mood and affect.    ED Course  Procedures (including critical care time)  Labs Reviewed  CBC WITH DIFFERENTIAL - Abnormal; Notable for the following:    Neutro Abs 7.8 (*)     All other components within normal limits  BASIC METABOLIC PANEL - Abnormal; Notable for the following:    Glucose, Bld 204 (*)     All other components within normal limits  URINALYSIS, ROUTINE W REFLEX MICROSCOPIC  URINE CULTURE   Results  for orders placed during the hospital encounter of 04/17/12  CBC WITH DIFFERENTIAL      Component Value Range   WBC 10.3  4.0 - 10.5 K/uL   RBC 4.12  3.87 - 5.11 MIL/uL   Hemoglobin 12.8  12.0 - 15.0 g/dL   HCT 37.1  36.0 - 46.0 %   MCV 90.0  78.0 - 100.0 fL   MCH 31.1  26.0 - 34.0 pg   MCHC 34.5  30.0 - 36.0 g/dL   RDW 13.7  11.5 - 15.5 %   Platelets 220  150 - 400 K/uL   Neutrophils Relative 76  43 - 77 %   Neutro Abs 7.8 (*) 1.7 - 7.7 K/uL   Lymphocytes Relative 19  12 - 46 %   Lymphs Abs 1.9  0.7 - 4.0 K/uL   Monocytes Relative 4  3 - 12 %   Monocytes Absolute 0.4  0.1 - 1.0 K/uL   Eosinophils Relative 1  0 - 5 %   Eosinophils Absolute 0.1  0.0 - 0.7 K/uL   Basophils Relative 1  0 - 1 %   Basophils Absolute 0.1  0.0 - 0.1 K/uL  BASIC METABOLIC PANEL      Component Value Range   Sodium 140  135 - 145 mEq/L   Potassium 3.5  3.5 - 5.1 mEq/L   Chloride 105  96 - 112 mEq/L   CO2 19  19 - 32 mEq/L   Glucose, Bld 204 (*) 70 - 99 mg/dL   BUN 16  6 - 23 mg/dL   Creatinine, Ser 0.74  0.50 - 1.10 mg/dL   Calcium 9.7  8.4 - 10.5 mg/dL   GFR calc non Af Amer >90  >90 mL/min   GFR calc Af Amer >90  >90 mL/min  URINALYSIS, ROUTINE W REFLEX MICROSCOPIC      Component Value Range   Color, Urine YELLOW  YELLOW   APPearance CLEAR  CLEAR   Specific Gravity, Urine 1.028  1.005 - 1.030   pH 6.0  5.0 - 8.0   Glucose, UA 100 (*) NEGATIVE mg/dL   Hgb urine dipstick NEGATIVE  NEGATIVE   Bilirubin Urine SMALL (*) NEGATIVE   Ketones, ur 15 (*) NEGATIVE mg/dL   Protein, ur 30 (*) NEGATIVE mg/dL   Urobilinogen, UA 0.2  0.0 - 1.0 mg/dL   Nitrite NEGATIVE  NEGATIVE   Leukocytes, UA NEGATIVE  NEGATIVE  URINE MICROSCOPIC-ADD ON      Component Value Range   Squamous Epithelial / LPF MANY (*) RARE   WBC, UA 0-2  <3 WBC/hpf   Urine-Other MUCOUS PRESENT      Dg Chest 2 View  04/17/2012  *RADIOLOGY REPORT*  Clinical  Data: Flank pain, emesis  CHEST - 2 VIEW  Comparison: 04/12/2012  Findings:  Stable heart size and vascularity.  Slight increased basilar atelectasis.  Axillary surgical clips noted.  No focal pneumonia, collapse, edema, consolidation, effusion or pneumothorax.  Degenerative changes of the spine with an associated scoliosis.  IMPRESSION: Stable exam.  Basilar atelectasis.   Original Report Authenticated By: Jerilynn Mages. Daryll Brod, M.D.    Ct Abdomen Pelvis Wo Contrast  04/17/2012  *RADIOLOGY REPORT*  Clinical Data: Left flank, abdominal pelvic pain with nausea/vomiting.  CT ABDOMEN AND PELVIS WITHOUT CONTRAST  Technique:  Multidetector CT imaging of the abdomen and pelvis was performed following the standard protocol without intravenous contrast.  Comparison: 12/14/2003  Findings: Cardiomegaly and mild bibasilar scarring noted.  The liver, spleen, gallbladder, adrenal glands and pancreas are unremarkable. Bilateral nonobstructing renal calculi are identified and include a 2 mm calculus within the upper right kidney, a 5 mm calculus within the mid right kidney, a 5 mm calculus within the lower right kidney and a 4 mm calculus within the lower left kidney. There is no evidence of hydronephrosis or ureteral calculi.  Please note that parenchymal abnormalities may be missed as intravenous contrast was not administered.  No free fluid, enlarged lymph nodes, biliary dilation or abdominal aortic aneurysm identified.  The bowel, appendix and bladder are within normal limits. The uterus and adnexal regions are unremarkable.  No acute or suspicious bony abnormalities are identified. Moderate to severe degenerative changes throughout the thoracic and lumbar spine noted.  IMPRESSION: No evidence of acute abnormality.  Nonobstructing bilateral renal calculi.  No evidence of hydronephrosis or ureteral calculi.  Moderate to severe degenerative changes throughout the thoracic and lumbar spine.   Original Report Authenticated By: Lura Em, M.D.    Dg Chest 2 View  04/17/2012  *RADIOLOGY REPORT*   Clinical Data: Flank pain, emesis  CHEST - 2 VIEW  Comparison: 04/12/2012  Findings: Stable heart size and vascularity.  Slight increased basilar atelectasis.  Axillary surgical clips noted.  No focal pneumonia, collapse, edema, consolidation, effusion or pneumothorax.  Degenerative changes of the spine with an associated scoliosis.  IMPRESSION: Stable exam.  Basilar atelectasis.   Original Report Authenticated By: Jerilynn Mages. Daryll Brod, M.D.     No diagnosis found. 1. Flank pain 2. Nausea and vomiting   MDM  She is tolerating PO fluids and solids. No further vomiting. Lab studies and CT abd/pel are essentially unremarkable. Patient is still taking Levaquin now and has another 4 days dosing. Patient is stable for discharge.        Leotis Shames, PA-C 04/17/12 2152

## 2012-04-17 NOTE — ED Notes (Addendum)
Onset of left lateral ribcage pain x1 week ago - gradually migrating to LLQ of abd - now into left flank; also endorses n/v and dysuria; saw PCP for same - dx'd with "possible" PNE - placed on 10 day course of Levaquin; also had u/a done at that time; however, reports PCP felt that Levaquin skewed results of u/a; denies cough; however, did have fevers up to 101.3

## 2012-04-18 LAB — URINE CULTURE: Culture: NO GROWTH

## 2012-04-18 NOTE — ED Provider Notes (Signed)
Medical screening examination/treatment/procedure(s) were performed by non-physician practitioner and as supervising physician I was immediately available for consultation/collaboration.  Veryl Speak, MD 04/18/12 [phone]

## 2012-04-19 ENCOUNTER — Ambulatory Visit (HOSPITAL_BASED_OUTPATIENT_CLINIC_OR_DEPARTMENT_OTHER): Payer: Federal, State, Local not specified - PPO | Admitting: Hematology & Oncology

## 2012-04-19 VITALS — BP 139/66 | HR 75 | Temp 98.2°F | Resp 16 | Ht 62.0 in | Wt 160.0 lb

## 2012-04-19 DIAGNOSIS — C50919 Malignant neoplasm of unspecified site of unspecified female breast: Secondary | ICD-10-CM

## 2012-04-19 DIAGNOSIS — Z853 Personal history of malignant neoplasm of breast: Secondary | ICD-10-CM

## 2012-04-19 NOTE — Progress Notes (Signed)
This office note has been dictated.

## 2012-04-20 NOTE — Progress Notes (Signed)
CC:   Selinda Orion, M.D. 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  CURRENT THERAPY:  Observation.  INTERIM HISTORY:  [name] comes in for her follow-up.  She is doing quite well.  She reports having a lot of problems with her back.  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  She was recently in the emergency room because of abdominal pain.  She does have kidney stones.  She had a CT scan of the abdomen and pelvis. This showed nonobstructing bilateral kidney stones.  There was no hydronephrosis.  She had moderate to severe degenerative changes throughout the thoracic and lumbar spine.  Her pain is getting a little bit better.  The pain seems to be mostly on the left side.  There has been no change in bowel or bladder habits.  She has had no leg swelling.  She has had no cough or shortness of breath.  Her last mammogram was back in May.  We have been following the mammograms.  PHYSICAL EXAMINATION:  General:  This is a well-developed, well- nourished white female in no obvious distress.  Vital Signs:  98.2, pulse 75, respiratory rate 16, blood pressure 1/39/66.  Weight is 160. Head and neck:  Normocephalic, atraumatic skull.  There are no ocular or oral lesions.  There are no palpable cervical or supraclavicular lymph nodes.  Lungs:  Clear bilaterally.  Cardiac:  Regular rate and rhythm with a normal S1 and S2.  There are no murmurs, rubs, or bruits. Breasts:  Right breast with no masses, edema, or erythema.  There is no right axillary adenopathy.  Left breast shows well-healed lumpectomy at the 1 o'clock position.  There are some radiation telangiectasias at the lumpectomy site.  There is some firmness at the lumpectomy site.  Her left breast is slightly contracted.  No distinct masses noted in the left breast.  There is no left axillary adenopathy.  Abdomen:  Soft with good bowel sounds.  There  may be some slight tenderness in the left lower quadrant.  There is no fluid wave.  There is no obvious abdominal mass.  There is no palpable hepatosplenomegaly.  Back:  No tenderness of the spine, ribs, or hips.  Extremities:  No clubbing, cyanosis, or edema.  LABORATORY STUDIES:  (04/17/2012) white cell count 10.3, hemoglobin 12.8, hematocrit 37.1, platelet count 220.  Electrolytes show a BUN of 16, creatinine 0.74.  IMPRESSION:  [name] is a very charming 56 year old white female with past history of stage I ductal carcinoma of the left breast.  She was diagnosed back 10 years ago.  She received adjuvant chemotherapy with FEC.  She did undergo an oophorectomy.  I then placed her on aromatase inhibitor therapy.  I do not see any evidence of recurrence.  I am not sure where this pain is coming from.  Hopefully, it will "work its way out."  I will plan to see her back in 1 more year for follow-up.    ______________________________ [name], M.D. PRE/MEDQ  D:  04/19/2012  T:  04/20/2012  Job:  [id number]

## 2012-07-16 ENCOUNTER — Encounter (INDEPENDENT_AMBULATORY_CARE_PROVIDER_SITE_OTHER): Payer: Self-pay

## 2012-07-19 ENCOUNTER — Encounter (INDEPENDENT_AMBULATORY_CARE_PROVIDER_SITE_OTHER): Payer: Self-pay | Admitting: General Surgery

## 2012-07-19 ENCOUNTER — Telehealth (INDEPENDENT_AMBULATORY_CARE_PROVIDER_SITE_OTHER): Payer: Self-pay | Admitting: General Surgery

## 2012-07-19 ENCOUNTER — Other Ambulatory Visit (INDEPENDENT_AMBULATORY_CARE_PROVIDER_SITE_OTHER): Payer: Self-pay | Admitting: General Surgery

## 2012-07-19 ENCOUNTER — Ambulatory Visit (INDEPENDENT_AMBULATORY_CARE_PROVIDER_SITE_OTHER): Payer: Federal, State, Local not specified - PPO | Admitting: General Surgery

## 2012-07-19 VITALS — BP 148/62 | HR 58 | Temp 97.5°F | Resp 18 | Ht 62.0 in | Wt 161.0 lb

## 2012-07-19 DIAGNOSIS — R1013 Epigastric pain: Secondary | ICD-10-CM

## 2012-07-19 NOTE — Progress Notes (Signed)
Patient ID: [name], female   DOB: 08/11/1955, 57 y.o.   MRN: [id number]  No chief complaint on file.   HPI [name] is a 57 y.o. female.  She is referred to me by Dr. Grant Ruts in [place] for evaluation of abdominal pain, possible acalculous cholecystitis.  This patient is a prior patient of mine. She underwent left partial mastectomy and sentinel node biopsy on 03/02/2001. She had invasive carcinoma, pathologic stage T1c., N0. She is followed by Dr. Mendel Ryder has no known recurrence. Her Port-A-Cath was removed 04/23/2002. She has subsequently had a laparoscopic bilateral salpingo-oophorectomy.  Her current problem is abdominal pain,nausea,vomiting and diarrhea. She recalls that I  evaluated her with Dr. Geoffery Lyons 20 years ago to see if she had gallbladder disease but we did not find anything abnormal. For the past 12 months she's been having intermittent episodes of nausea, vomiting, diarrhea, and sometimes epigastric pain.  She states the episode last about 3 hours and will then resolve. These were occurring about every 3 months but she did have 3 separate episodes in late November. She does not note any aggravating or alleviating factors. No food triggers that she can recall.  Initial evaluation by Dr. Ranae Palms revealed amylase 86 which was normal, lipase 85 which was elevated, white blood cell count 12,800, normal liver function test. Those were drawn 07/05/2012. Subsequent labwork on January 9 were reportedly normal. An ultrasound was performed on 07/09/2012. There are no gallstones. There is borderline gallbladder wall thickening. Common bile duct borderline at 9 mm. CT scan of the abdomen and pelvis on 07/12/2012 is negative for pancreatitis, negative for gallbladder disease, small, bilateral Non-obstructing renal calculi.  She is asymptomatic today HPI  Past Medical History  Diagnosis Date  . Diabetes mellitus without complication   . Breast cancer     No  past surgical history on file.  No family history on file.  Social History History  Substance Use Topics  . Smoking status: Never Smoker   . Smokeless tobacco: Not on file  . Alcohol Use: No    Allergies  Allergen Reactions  . Cleocin (Clindamycin Hcl) Other (See Comments)    "irritated my esophagus"  . Ivp Dye (Iodinated Diagnostic Agents) Other (See Comments)    "feels like I get really hot"  . Morphine And Related Nausea And Vomiting  . Pneumococcal Vaccines Other (See Comments)    Really high fever, and flu symptoms. MD instructed not to give    Current Outpatient Prescriptions  Medication Sig Dispense Refill  . glipiZIDE (GLUCOTROL XL) 5 MG 24 hr tablet Take 5 mg by mouth daily.      Marland Kitchen lisinopril (PRINIVIL,ZESTRIL) 10 MG tablet Take 10 mg by mouth daily.      . metFORMIN (GLUCOPHAGE) 1000 MG tablet Take 1,000 mg by mouth 2 (two) times daily with a meal.      . methocarbamol (ROBAXIN) 500 MG tablet 500 mg at bedtime. Muscle relaxent      . ondansetron (ZOFRAN) 4 MG tablet Take 1 tablet (4 mg total) by mouth every 6 (six) hours.  12 tablet  0  . rosuvastatin (CRESTOR) 10 MG tablet Take 10 mg by mouth at bedtime.      . sitaGLIPtin (JANUVIA) 100 MG tablet Take 100 mg by mouth daily.      Marland Kitchen tiZANidine (ZANAFLEX) 4 MG tablet Take 4 mg by mouth every 8 (eight) hours as needed. Muscle relaxent      . traMADol (  ULTRAM) 50 MG tablet 50 mg every 6 (six) hours as needed.         Review of Systems Review of Systems  Constitutional: Negative for fever, chills and unexpected weight change.  HENT: Negative for hearing loss, congestion, sore throat, trouble swallowing and voice change.   Eyes: Negative for visual disturbance.  Respiratory: Negative for cough and wheezing.   Cardiovascular: Negative for chest pain, palpitations and leg swelling.  Gastrointestinal: Positive for nausea, vomiting, abdominal pain and diarrhea. Negative for constipation, blood in stool, abdominal distention  and anal bleeding.  Genitourinary: Negative for hematuria, vaginal bleeding and difficulty urinating.  Musculoskeletal: Negative for arthralgias.  Skin: Negative for rash and wound.  Neurological: Negative for seizures, syncope and headaches.  Hematological: Negative for adenopathy. Does not bruise/bleed easily.  Psychiatric/Behavioral: Negative for confusion.    Blood pressure 148/62, pulse 58, temperature 97.5 F (36.4 C), temperature source Temporal, resp. rate 18, height 5\' 2"  (1.575 m), weight 161 lb (73.029 kg).  Physical Exam Physical Exam  Constitutional: She is oriented to person, place, and time. She appears well-developed and well-nourished. No distress.  HENT:  Head: Normocephalic and atraumatic.  Nose: Nose normal.  Mouth/Throat: No oropharyngeal exudate.  Eyes: Conjunctivae normal and EOM are normal. Pupils are equal, round, and reactive to light. Left eye exhibits no discharge. No scleral icterus.  Neck: Neck supple. No JVD present. No tracheal deviation present. No thyromegaly present.  Cardiovascular: Normal rate, regular rhythm, normal heart sounds and intact distal pulses.   No murmur heard. Pulmonary/Chest: Effort normal and breath sounds normal. No respiratory distress. She has no wheezes. She has no rales. She exhibits no tenderness.  Abdominal: Soft. Bowel sounds are normal. She exhibits no distension and no mass. There is no tenderness. There is no rebound and no guarding.       Small infraumbilical incision. No hernias. No tenderness. No mass.  Musculoskeletal: She exhibits no edema and no tenderness.  Lymphadenopathy:    She has no cervical adenopathy.  Neurological: She is alert and oriented to person, place, and time. She exhibits normal muscle tone. Coordination normal.  Skin: Skin is warm. No rash noted. She is not diaphoretic. No erythema. No pallor.  Psychiatric: She has a normal mood and affect. Her behavior is normal. Judgment and thought content  normal.    Data Reviewed Recent lab work, recent imaging studies, recent office notes from Dr. Ranae Palms, recent office visit from Dr. Marin Olp.  Assessment    Recurrent, intermittent episodes of nausea vomiting diarrhea and sometimes epigastric pain. The patient's personal clinical history and family history of emergency gallbladder surgery and sister suggest the possibility of biliary tract disease. Unfortunately, her imaging studies are completely nondiagnostic.  History of left partial mastectomy and sentinel the biopsy 2002 for stage I invasive ductal carcinoma. Minimal recurrence  History laparoscopic bilateral nephrectomy  Non-insulin-dependent diabetes mellitus  Hypertension  Hyperlipidemia    Plan    There is no indication for cholecystectomy at this time, although it is possible this may be required in the future.  Empiric trial of proton pump inhibitors. She will take Prilosec one tablet daily starting now.  Scheduled for CCK stimulated hepatobiliary scan. If that is normal she should be referred to gastroenterology. If the biliary scan is abnormal then perhaps cholecystectomy would be the next step.  Return to see me after the biliary scan.       Edsel Petrin. Dalbert Batman, M.D., [facility] General and Minimally invasive  Surgery Breast and Colorectal Surgery Office:   [phone] Pager:   [phone]  07/19/2012, 12:29 PM

## 2012-07-19 NOTE — Telephone Encounter (Signed)
Called patient and left a message for her to advise the appointment for her test was set up for 07/26/12 at 6:45 am at [facility].   Patient called back and was given the information above. Patient stated that her grandson is having surgery on 07/26/12 at 10:00. I gave her the direct number to nuclear medicine scheduling in order to ask how long the test would take in order for her to arrive at the hospital in time for her grandson's procedure. Patient advised to call me back if the date of the procedure has to be changed. Patient agreed.

## 2012-07-19 NOTE — Telephone Encounter (Signed)
Called and left a message for [name] at [facility] nuclear medicine scheduling. Patient needs to have appointment date for NM hepato w/eject fract. Patient stated she is off work next week on Monday (07/23/12) and Thursday (07/26/12). Requested call back to advise.  Patient gave permission to call and leave message to advise if needed if she is unable to pick up.

## 2012-07-19 NOTE — Telephone Encounter (Signed)
[name] with scheduling called to let you know she has patient scheduled for her HIDA scan on 07/26/2012 to arrive at 6:45 am. NPO after midnight. She is asking that you call patient with appt.

## 2012-07-19 NOTE — Patient Instructions (Signed)
Your symptoms of nausea and vomiting and diarrhea and upper abdominal pain are suggestive of gallbladder disease, but your lab work and x-rays do not confirm the diagnosis.  I do not think that the next step is a gallbladder operation, although that might become necessary in the future.  For the next 2-3 weeks, I recommended you take omeprazole 20 mg daily to reduce the acid in your stomach. You can buy this over-the-counter at your drugstore.  You will be scheduled for a hepatobiliary scan to see if there are any abnormalities.  You will be given an appointment to return to see me after the biliary scan.

## 2012-07-26 ENCOUNTER — Encounter (INDEPENDENT_AMBULATORY_CARE_PROVIDER_SITE_OTHER): Payer: Self-pay | Admitting: General Surgery

## 2012-07-26 ENCOUNTER — Telehealth (INDEPENDENT_AMBULATORY_CARE_PROVIDER_SITE_OTHER): Payer: Self-pay

## 2012-07-26 ENCOUNTER — Encounter (HOSPITAL_COMMUNITY)
Admission: RE | Admit: 2012-07-26 | Discharge: 2012-07-26 | Disposition: A | Discharge status: Home / Self Care | Payer: Federal, State, Local not specified - PPO | Source: Ambulatory Visit | Attending: General Surgery | Admitting: General Surgery

## 2012-07-26 ENCOUNTER — Telehealth (INDEPENDENT_AMBULATORY_CARE_PROVIDER_SITE_OTHER): Payer: Self-pay | Admitting: General Surgery

## 2012-07-26 DIAGNOSIS — R1013 Epigastric pain: Secondary | ICD-10-CM | POA: Insufficient documentation

## 2012-07-26 MED ORDER — TECHNETIUM TC 99M MEBROFENIN IV KIT
5.0000 | PACK | Freq: Once | INTRAVENOUS | Status: AC | PRN
  Administered 2012-07-26: 5 via INTRAVENOUS

## 2012-07-26 MED ORDER — SINCALIDE 5 MCG IJ SOLR: 0.0200 ug/kg | Freq: Once | INTRAMUSCULAR | Status: DC

## 2012-07-26 MED ORDER — SINCALIDE 5 MCG IJ SOLR
INTRAMUSCULAR | Status: AC
  Administered 2012-07-26: 1.45 ug via INTRAVENOUS
  Filled 2012-07-26: qty 5

## 2012-07-26 NOTE — Telephone Encounter (Signed)
Called patient and advised of appointment to see Dr. Dalbert Batman to discuss test results. Patient to be seen on 08/03/12 at 4:45.

## 2012-07-26 NOTE — Telephone Encounter (Signed)
I called Dr. Rayford Halsted office to ask for fax #/ Fax is [phone]// Phone# is [phone]

## 2012-07-26 NOTE — Telephone Encounter (Signed)
The pt called to schedule a 2 wk follow up after her HIDA scan.  I let [name] know and she will schedule her.

## 2012-07-26 NOTE — Progress Notes (Signed)
Faxed to the attention of patient PCP Dr. [name] the last office note and test results from hepato w/eject fract taken on 07/26/12. Faxed to [phone], confirmation received.

## 2012-07-26 NOTE — Telephone Encounter (Signed)
Pt called to request that we fax a copy of her gallbladder test results done today at [place] to her Primary Care Dr. In [place]/ Dr. [name]? [phone].I WILL CK further FOR A FAX #/GY

## 2012-07-26 NOTE — Telephone Encounter (Signed)
Called patient to advise of test results per Dr. Dalbert Batman:  "Tell her that the biliary scan is normal. She should continue to take the proton pump inhibitor medication that I started her on. She may return to see me to discuss further plans, or she may ask her primary care physician to refer her to a gastroenterologist. There is no obvious indication for cholecystectomy at this time."  Patient said she will follow up with her pcp and request referral to gastroenterology. Patient wanted to cancel appointment made on 08/03/12. Advised patient to have gastroenterologist refer her back to [place] if they find there is something that does require surgical intervention. The patient agreed.

## 2012-08-03 ENCOUNTER — Encounter (INDEPENDENT_AMBULATORY_CARE_PROVIDER_SITE_OTHER): Payer: Federal, State, Local not specified - PPO | Admitting: General Surgery

## 2012-08-12 DIAGNOSIS — N200 Calculus of kidney: Secondary | ICD-10-CM

## 2012-08-12 HISTORY — DX: Calculus of kidney: N20.0

## 2012-08-18 ENCOUNTER — Other Ambulatory Visit: Payer: Self-pay

## 2012-08-30 ENCOUNTER — Encounter (INDEPENDENT_AMBULATORY_CARE_PROVIDER_SITE_OTHER): Payer: Self-pay

## 2012-11-07 ENCOUNTER — Other Ambulatory Visit: Payer: Self-pay

## 2012-11-07 DIAGNOSIS — Z1231 Encounter for screening mammogram for malignant neoplasm of breast: Secondary | ICD-10-CM

## 2012-12-13 ENCOUNTER — Ambulatory Visit
Admission: RE | Admit: 2012-12-13 | Discharge: 2012-12-13 | Disposition: A | Discharge status: Home / Self Care | Payer: Federal, State, Local not specified - PPO | Source: Ambulatory Visit

## 2012-12-13 DIAGNOSIS — Z1231 Encounter for screening mammogram for malignant neoplasm of breast: Secondary | ICD-10-CM

## 2013-04-18 ENCOUNTER — Other Ambulatory Visit (HOSPITAL_BASED_OUTPATIENT_CLINIC_OR_DEPARTMENT_OTHER): Payer: Federal, State, Local not specified - PPO | Admitting: Lab

## 2013-04-18 ENCOUNTER — Ambulatory Visit (HOSPITAL_BASED_OUTPATIENT_CLINIC_OR_DEPARTMENT_OTHER): Payer: Federal, State, Local not specified - PPO | Admitting: Hematology & Oncology

## 2013-04-18 VITALS — BP 136/74 | HR 90 | Temp 98.7°F | Resp 14 | Ht 62.0 in | Wt 156.0 lb

## 2013-04-18 DIAGNOSIS — Z853 Personal history of malignant neoplasm of breast: Secondary | ICD-10-CM

## 2013-04-18 DIAGNOSIS — C50912 Malignant neoplasm of unspecified site of left female breast: Secondary | ICD-10-CM

## 2013-04-18 LAB — COMPREHENSIVE METABOLIC PANEL
ALT: 21 U/L (ref 0–35)
AST: 16 U/L (ref 0–37)
Albumin: 4.3 g/dL (ref 3.5–5.2)
Calcium: 9.5 mg/dL (ref 8.4–10.5)
Chloride: 107 mEq/L (ref 96–112)
Potassium: 4.3 mEq/L (ref 3.5–5.3)
Sodium: 141 mEq/L (ref 135–145)
Total Protein: 6.9 g/dL (ref 6.0–8.3)

## 2013-04-18 LAB — CBC WITH DIFFERENTIAL (CANCER CENTER ONLY)
BASO#: 0 10*3/uL (ref 0.0–0.2)
BASO%: 0.3 % (ref 0.0–2.0)
EOS%: 1.8 % (ref 0.0–7.0)
HGB: 11.8 g/dL (ref 11.6–15.9)
LYMPH#: 2 10*3/uL (ref 0.9–3.3)
MCHC: 33.1 g/dL (ref 32.0–36.0)
NEUT#: 6.8 10*3/uL — ABNORMAL HIGH (ref 1.5–6.5)
Platelets: 212 10*3/uL (ref 145–400)
RDW: 13.6 % (ref 11.1–15.7)

## 2013-04-18 NOTE — Progress Notes (Signed)
This office note has been dictated.

## 2013-04-19 NOTE — Progress Notes (Signed)
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  CURRENT THERAPY:  Observation.  INTERIM HISTORY:  [name] comes in for followup.  She will be having major back surgery in 3 weeks.  Dr. Rolena Infante will be doing this.  She will have a spinal fusion.  She is actually looking forward to this.  She has really suffered quite a bit from her back.  Otherwise, she is doing well.  She is taking calcium and vitamin D.  She has not had any issues.  She does have diabetes.  She does have hypertension.  She is on quite a few medications.  Her cardiac doctor has cleared her for surgery.  She is also taking Xarelto.  This I am sure will be stopped 2 days before her surgery.  She had her last mammogram back in June.  The mammogram looked okay without any suspicious calcifications.  She has had no headache.  There has been no change in bowel or bladder habits. She says she has had a little but of diarrhea.  There has been no leg weakness.  She has had no leg swelling.  PHYSICAL EXAMINATION:  General:  This is a well-developed, well- nourished white female in no obvious distress.  Vital signs: Temperature 98.7, pulse 98, respiratory rate 14, and blood pressure 136/74.  Weight is 156 pounds.  Head and Neck:  Normocephalic, atraumatic skull.  There are no ocular or oral lesions.  There are no palpable cervical or supraclavicular lymph nodes.  Lungs:  Clear bilaterally.  Cardiac:  Regular rate and rhythm with a normal S1, S2. There are no murmurs, rubs or bruits.  Abdomen:  Soft.  She has good bowel sounds.  There is no fluid wave.  There is no palpable hepatosplenomegaly.  Breasts:  Shows right breast with no masses, edema or erythema.  There is no right axillary adenopathy.  Left breast shows some slight contraction.  She has a well-healed lumpectomy at the 2 o'clock position.  There is some radiation telangiectasias about  the lumpectomy site.  The lumpectomy site is firm with about any distinct mass.  There is no left axillary adenopathy.  Back:  No tenderness over the spine, ribs, or hips.  Extremities:  Show no clubbing, cyanosis or edema.  No lymphedema is noted in her left arm.  Skin:  No rashes, ecchymosis, or petechia, outside of the telangiectasia in the left lumpectomy site.  Neurological.  No focal neurological deficits.  LABORATORY STUDIES:  White cell count 9.5, hemoglobin 9.8, hematocrit 35.7, and platelet count 212,000.  IMPRESSION:  [name] is a very charming 57 year old white female with history of stage I infiltrating ductal carcinoma of the left breast. She now is 11 years out from treatment.  She received adjuvant FEC.  She had an oophorectomy.  She then was on aromatase inhibitor therapy.  I really believe that she is cured.  I do not see any evidence of recurrence right now.  We will go ahead and plan to get her back in 1 year.  We will certainly pray for her when she has her surgery.    ______________________________ [name], M.D. PRE/MEDQ  D:  04/18/2013  T:  04/19/2013  Job:  [id number]

## 2013-04-26 NOTE — H&P (Signed)
History of Present Illness The patient is a 57 year old female who presents today for follow up of their back. The patient is being followed for their low back back pain. They are now 3 week(s) out from last visit. Symptoms reported today include: pain (posterior neck radiating into low back and into bilateral legs, pain is worse on her right side than the left), pain at night, stiffness, difficulty arising from chair and numbness (bilateral hands and sides of bilateral feet, tingling in bilateral legs). The patient states that they are doing poorly. The following medication has been used for pain control: Tramadol. The patient reports their current pain level to be 3 / 10 (patient has had nerve blocks and states that her pain does increase when blocks wear off). The patient presents today following discuss surgery.   Subjective Transcription  This is a 57 year old white female with a history of low back pain and bilateral lower extremity radicular symptoms who returns. She has a known history of multilevel lumbar spondylosis/stenosis. Symptoms are unchanged from previous visit. She is wanting to proceed with scheduling of T9 to S1 fusion with T12 to S1 decompression as discussed at her last office visit. She continues to have low back pain with lower extremity radicular symptoms along with a feeling of leg weakness. No bowel or bladder incontinence. The patient does have a medical history significant for atrial fibrillation and is due to see her cardiologist, Dr. Jimmie Molly. He also has an appointment with Dr. Cecille Amsterdam, primary care physician for clearance as well.   Allergies CLEOCIN. 03/03/2005 MORPHINE. 03/03/2005 IODINE. 03/03/2005    Social History Number of flights of stairs before winded. 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 married Pain Contract. no Tobacco use. Never smoker. never smoker; uses  less than half 1/2 can(s) smokeless per week Tobacco / smoke exposure. no Previously in rehab. no Current work status. working full time Children. 2 Alcohol use. Never consumed alcohol. former drinker Drug/Alcohol Rehab (Currently). no Living situation. live with spouse Illicit drug use. no Exercise. Exercises rarely; does running / walking    Medication History TiZANidine HCl (4MG  Tablet, 1 (one) Tablet Oral at bedtime, Taken starting 11/07/2012) Active. (prn) Flecainide Acetate (50MG  Tablet, Oral) Active. Xarelto (20MG  Tablet, Oral) Active. Omeprazole (20MG  Capsule DR, Oral) Active. Lisinopril-Hydrochlorothiazide (10-12.5MG  Tablet, Oral) Active. Crestor (10MG  Tablet, Oral) Active. GlipiZIDE ER (5MG  Tablet ER 24HR, Oral) Active. MetFORMIN HCl (1000MG  Tablet, Oral) Active. Januvia (100MG  Tablet, Oral) Active. Fluticasone Propionate (50MCG/ACT Suspension, Nasal) Active. Carvedilol (6.25MG  Tablet, 1 Oral two times daily) Active. Nitrofurantoin Macrocrystal (50MG  Capsule, 1 Oral daily) Active. Medications Reconciled.    Objective Transcription  Pleasant elderly white female, alert and oriented x3 in no acute distress. Gait is normal. Negative logroll bilateral hips. Negative straight leg raise. Bilateral calves are nontender. She has a trace of left greater than right anterior tib weakness. Pedal pulses are intact. Head: Normocephalic, atraumatic. Extraocular muscles are intact. Lungs: Clear to auscultation bilaterally. No wheezing noted. Heart: Irregular with a history of A-fib. No murmurs noted. Abdomen is round and nondistended, soft and nontender. No increase in respiratory effort.   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 At this point in time we have had along discussion about her surgical procedure. As I indicated before, she has multilevel spinal stenosis confirmed by  her new MRI. She has severe stenosis from T12, L1 down to L5-S1. At this point  in order to adequately address the neuropathic pain she is having, she would need a multilevel thoracolumbar decompression most likely from T12 down to S1. Because of the underlying degenerative scoliosis, I would then supplement this with a posterior instrumented fusion from T10 or T9 down to S1. I have again gone over the risks which include infection, bleeding, nerve damage, death, stroke, paralysis, failure to heal, need for further surgery, ongoing or worse pain, loss of bowel and bladder control, and nonunion. All of her and her husband's questions have been addressed. At this point I don't think she will be able to return to gainful employment once she has recovered. This is an extensive operation and our goal here is simple improved quality of life, but in terms of being able to hold down a regular eight hour a day work job with anything beyond sedentary activity is quite low. I have given her a permanent handicapped sticker. I would expect her to be recovering over the next year and so I definitely don't think she will be returning to work this year or within the year. We will be planning on the multilevel decompression spinal fusion in the very near future.

## 2013-05-02 ENCOUNTER — Encounter (HOSPITAL_COMMUNITY): Payer: Self-pay

## 2013-05-03 ENCOUNTER — Encounter (HOSPITAL_COMMUNITY): Payer: Self-pay

## 2013-05-03 ENCOUNTER — Ambulatory Visit (HOSPITAL_COMMUNITY)
Admission: RE | Admit: 2013-05-03 | Discharge: 2013-05-03 | Disposition: A | Discharge status: Home / Self Care | Payer: Federal, State, Local not specified - PPO | Source: Ambulatory Visit | Attending: Anesthesiology | Admitting: Anesthesiology

## 2013-05-03 ENCOUNTER — Encounter (HOSPITAL_COMMUNITY)
Admission: RE | Admit: 2013-05-03 | Discharge: 2013-05-03 | Disposition: A | Discharge status: Home / Self Care | Payer: Federal, State, Local not specified - PPO | Source: Ambulatory Visit | Attending: Orthopedic Surgery | Admitting: Orthopedic Surgery

## 2013-05-03 ENCOUNTER — Other Ambulatory Visit (HOSPITAL_COMMUNITY): Payer: Self-pay | Admitting: *Deleted

## 2013-05-03 DIAGNOSIS — M48061 Spinal stenosis, lumbar region without neurogenic claudication: Secondary | ICD-10-CM | POA: Insufficient documentation

## 2013-05-03 DIAGNOSIS — R9431 Abnormal electrocardiogram [ECG] [EKG]: Secondary | ICD-10-CM | POA: Insufficient documentation

## 2013-05-03 DIAGNOSIS — Z01811 Encounter for preprocedural respiratory examination: Secondary | ICD-10-CM | POA: Insufficient documentation

## 2013-05-03 DIAGNOSIS — I10 Essential (primary) hypertension: Secondary | ICD-10-CM | POA: Insufficient documentation

## 2013-05-03 DIAGNOSIS — E119 Type 2 diabetes mellitus without complications: Secondary | ICD-10-CM | POA: Insufficient documentation

## 2013-05-03 DIAGNOSIS — Z01812 Encounter for preprocedural laboratory examination: Secondary | ICD-10-CM | POA: Insufficient documentation

## 2013-05-03 DIAGNOSIS — Z01810 Encounter for preprocedural cardiovascular examination: Secondary | ICD-10-CM | POA: Insufficient documentation

## 2013-05-03 DIAGNOSIS — M47814 Spondylosis without myelopathy or radiculopathy, thoracic region: Secondary | ICD-10-CM | POA: Insufficient documentation

## 2013-05-03 HISTORY — DX: Other specified postprocedural states: Z98.890

## 2013-05-03 HISTORY — DX: Cardiac arrhythmia, unspecified: I49.9

## 2013-05-03 HISTORY — DX: Nausea with vomiting, unspecified: R11.2

## 2013-05-03 HISTORY — DX: Unspecified osteoarthritis, unspecified site: M19.90

## 2013-05-03 HISTORY — DX: Unspecified asthma, uncomplicated: J45.909

## 2013-05-03 HISTORY — DX: Gastro-esophageal reflux disease without esophagitis: K21.9

## 2013-05-03 HISTORY — DX: Sepsis, unspecified organism: A41.9

## 2013-05-03 HISTORY — DX: Pneumonia, unspecified organism: J18.9

## 2013-05-03 HISTORY — DX: Cardiac murmur, unspecified: R01.1

## 2013-05-03 LAB — BASIC METABOLIC PANEL
BUN: 26 mg/dL — ABNORMAL HIGH (ref 6–23)
CO2: 20 mEq/L (ref 19–32)
Calcium: 9.8 mg/dL (ref 8.4–10.5)
Chloride: 104 mEq/L (ref 96–112)
Glucose, Bld: 89 mg/dL (ref 70–99)
Sodium: 139 mEq/L (ref 135–145)

## 2013-05-03 LAB — CBC
HCT: 36 % (ref 36.0–46.0)
Hemoglobin: 12 g/dL (ref 12.0–15.0)
MCHC: 33.3 g/dL (ref 30.0–36.0)
MCV: 92.1 fL (ref 78.0–100.0)
RBC: 3.91 MIL/uL (ref 3.87–5.11)
RDW: 14.1 % (ref 11.5–15.5)

## 2013-05-03 LAB — SURGICAL PCR SCREEN: Staphylococcus aureus: POSITIVE — AB

## 2013-05-03 NOTE — Pre-Procedure Instructions (Addendum)
DNAJA SLEPPY  05/03/2013   Your procedure is scheduled on:  05/08/13  Report to [place]  2 * 3 at 630 AM.  Call this number if you have problems the morning of surgery: [phone]   Remember:   Do not eat food or drink liquids after midnight.   Take these medicines the morning of surgery with A SIP OF WATER: inhaler if needed, carvedilol,flecainide,omeprazole,pain med if needed            *STOP   xarelto per dr       Leighton Parody vit     Do not wear jewelry, make-up or nail polish.  Do not wear lotions, powders, or perfumes. You may wear deodorant.  Do not shave 48 hours prior to surgery. Men may shave face and neck.  Do not bring valuables to the hospital.  [facility] is not responsible                  for any belongings or valuables.               Contacts, dentures or bridgework may not be worn into surgery.  Leave suitcase in the car. After surgery it may be brought to your room.  For patients admitted to the hospital, discharge time is determined by your                treatment team.               Patients discharged the day of surgery will not be allowed to drive  home.  Name and phone number of your driver:   Special Instructions: Shower using CHG 2 nights before surgery and the night before surgery.  If you shower the day of surgery use CHG.  Use special wash - you have one bottle of CHG for all showers.  You should use approximately 1/3 of the bottle for each shower.   Please read over the following fact sheets that you were given: Pain Booklet, Coughing and Deep Breathing, Blood Transfusion Information, MRSA Information and Surgical Site Infection Prevention

## 2013-05-03 NOTE — Progress Notes (Signed)
req'd notes ,stress,echo, ekg from dr Johnanna Schneiders cardiology

## 2013-05-07 MED ORDER — CEFAZOLIN SODIUM-DEXTROSE 2-3 GM-% IV SOLR
2.0000 g | INTRAVENOUS | Status: AC
  Administered 2013-05-08 (×3): 2 g via INTRAVENOUS
  Filled 2013-05-07: qty 50

## 2013-05-08 ENCOUNTER — Inpatient Hospital Stay (HOSPITAL_COMMUNITY): Payer: Federal, State, Local not specified - PPO

## 2013-05-08 ENCOUNTER — Encounter (HOSPITAL_COMMUNITY): Payer: Self-pay | Admitting: Anesthesiology

## 2013-05-08 ENCOUNTER — Inpatient Hospital Stay (HOSPITAL_COMMUNITY)
Admission: RE | Admit: 2013-05-08 | Discharge: 2013-05-13 | DRG: 459 | Disposition: A | Discharge status: Home / Self Care | Payer: Federal, State, Local not specified - PPO | Source: Ambulatory Visit | Attending: Orthopedic Surgery | Admitting: Orthopedic Surgery

## 2013-05-08 ENCOUNTER — Encounter (HOSPITAL_COMMUNITY): Payer: Federal, State, Local not specified - PPO | Admitting: Anesthesiology

## 2013-05-08 ENCOUNTER — Inpatient Hospital Stay (HOSPITAL_COMMUNITY): Payer: Federal, State, Local not specified - PPO | Admitting: Anesthesiology

## 2013-05-08 ENCOUNTER — Encounter (HOSPITAL_COMMUNITY)
Admission: RE | Disposition: A | Discharge status: Home / Self Care | Payer: Federal, State, Local not specified - PPO | Source: Ambulatory Visit | Attending: Orthopedic Surgery

## 2013-05-08 DIAGNOSIS — I4891 Unspecified atrial fibrillation: Secondary | ICD-10-CM | POA: Diagnosis present

## 2013-05-08 DIAGNOSIS — J9600 Acute respiratory failure, unspecified whether with hypoxia or hypercapnia: Secondary | ICD-10-CM

## 2013-05-08 DIAGNOSIS — C50919 Malignant neoplasm of unspecified site of unspecified female breast: Secondary | ICD-10-CM

## 2013-05-08 DIAGNOSIS — Z781 Physical restraint status: Secondary | ICD-10-CM | POA: Diagnosis present

## 2013-05-08 DIAGNOSIS — I10 Essential (primary) hypertension: Secondary | ICD-10-CM

## 2013-05-08 DIAGNOSIS — E119 Type 2 diabetes mellitus without complications: Secondary | ICD-10-CM | POA: Diagnosis present

## 2013-05-08 DIAGNOSIS — K219 Gastro-esophageal reflux disease without esophagitis: Secondary | ICD-10-CM | POA: Diagnosis present

## 2013-05-08 DIAGNOSIS — J9690 Respiratory failure, unspecified, unspecified whether with hypoxia or hypercapnia: Secondary | ICD-10-CM

## 2013-05-08 DIAGNOSIS — M4180 Other forms of scoliosis, site unspecified: Secondary | ICD-10-CM | POA: Diagnosis present

## 2013-05-08 DIAGNOSIS — M4804 Spinal stenosis, thoracic region: Secondary | ICD-10-CM | POA: Diagnosis present

## 2013-05-08 DIAGNOSIS — M4310 Spondylolisthesis, site unspecified: Principal | ICD-10-CM | POA: Diagnosis present

## 2013-05-08 DIAGNOSIS — J45909 Unspecified asthma, uncomplicated: Secondary | ICD-10-CM | POA: Diagnosis present

## 2013-05-08 DIAGNOSIS — Z853 Personal history of malignant neoplasm of breast: Secondary | ICD-10-CM

## 2013-05-08 DIAGNOSIS — M48061 Spinal stenosis, lumbar region without neurogenic claudication: Secondary | ICD-10-CM | POA: Diagnosis present

## 2013-05-08 DIAGNOSIS — J95821 Acute postprocedural respiratory failure: Secondary | ICD-10-CM | POA: Diagnosis not present

## 2013-05-08 HISTORY — DX: Essential (primary) hypertension: I10

## 2013-05-08 HISTORY — DX: Respiratory failure, unspecified, unspecified whether with hypoxia or hypercapnia: J96.90

## 2013-05-08 HISTORY — PX: SPINAL FUSION: SHX223

## 2013-05-08 LAB — BLOOD GAS, ARTERIAL
Acid-base deficit: 3.6 mmol/L — ABNORMAL HIGH (ref 0.0–2.0)
Bicarbonate: 21.7 mEq/L (ref 20.0–24.0)
MECHVT: 410 mL
PEEP: 5 cmH2O
Patient temperature: 98.6
RATE: 16 resp/min
pH, Arterial: 7.309 — ABNORMAL LOW (ref 7.350–7.450)

## 2013-05-08 LAB — POCT I-STAT 4, (NA,K, GLUC, HGB,HCT)
Glucose, Bld: 148 mg/dL — ABNORMAL HIGH (ref 70–99)
Glucose, Bld: 150 mg/dL — ABNORMAL HIGH (ref 70–99)
HCT: 17 % — ABNORMAL LOW (ref 36.0–46.0)
Hemoglobin: 5.8 g/dL — CL (ref 12.0–15.0)
Hemoglobin: 10.2 g/dL — ABNORMAL LOW (ref 12.0–15.0)
Potassium: 4 mEq/L (ref 3.5–5.1)
Potassium: 3.9 mEq/L (ref 3.5–5.1)
Potassium: 4.5 mEq/L (ref 3.5–5.1)
Sodium: 140 mEq/L (ref 135–145)
Sodium: 141 mEq/L (ref 135–145)

## 2013-05-08 LAB — BASIC METABOLIC PANEL
BUN: 16 mg/dL (ref 6–23)
GFR calc Af Amer: >90 mL/min (ref >90)
GFR calc non Af Amer: >90 mL/min (ref >90)
Potassium: 3.9 mEq/L (ref 3.5–5.1)
Sodium: 135 mEq/L (ref 135–145)

## 2013-05-08 LAB — PREPARE RBC (CROSSMATCH)

## 2013-05-08 LAB — GLUCOSE, CAPILLARY
Glucose-Capillary: 86 mg/dL (ref 70–99)
Glucose-Capillary: 148 mg/dL — ABNORMAL HIGH (ref 70–99)

## 2013-05-08 LAB — PROTIME-INR
INR: 1.23 (ref 0.00–1.49)
Prothrombin Time: 15.2 seconds (ref 11.6–15.2)

## 2013-05-08 SURGERY — FUSION, SPINE, 2 OR MORE LEVELS, POSTERIOR APPROACH
Anesthesia: General | Site: Back | Wound class: Clean

## 2013-05-08 MED ORDER — THROMBIN 20000 UNITS EX SOLR
CUTANEOUS | Status: AC
  Filled 2013-05-08: qty 20000

## 2013-05-08 MED ORDER — FENTANYL CITRATE 0.05 MG/ML IJ SOLN: 50.0000 ug | Freq: Once | INTRAMUSCULAR | Status: AC

## 2013-05-08 MED ORDER — FENTANYL CITRATE 0.05 MG/ML IJ SOLN
INTRAMUSCULAR | Status: DC | PRN
  Administered 2013-05-08: 150 ug via INTRAVENOUS
  Administered 2013-05-08: 100 ug via INTRAVENOUS
  Administered 2013-05-08 (×2): 50 ug via INTRAVENOUS
  Administered 2013-05-08: 100 ug via INTRAVENOUS
  Administered 2013-05-08: 50 ug via INTRAVENOUS
  Administered 2013-05-08: 100 ug via INTRAVENOUS
  Administered 2013-05-08 (×3): 50 ug via INTRAVENOUS

## 2013-05-08 MED ORDER — PROMETHAZINE HCL 25 MG/ML IJ SOLN: 6.2500 mg | INTRAMUSCULAR | Status: DC | PRN

## 2013-05-08 MED ORDER — MENTHOL 3 MG MT LOZG
1.0000 | LOZENGE | OROMUCOSAL | Status: DC | PRN
  Filled 2013-05-08: qty 9

## 2013-05-08 MED ORDER — LACTATED RINGERS IV SOLN
INTRAVENOUS | Status: DC | PRN
  Administered 2013-05-08 (×3): via INTRAVENOUS

## 2013-05-08 MED ORDER — ACETAMINOPHEN 10 MG/ML IV SOLN
1000.0000 mg | INTRAVENOUS | Status: DC
  Filled 2013-05-08: qty 100

## 2013-05-08 MED ORDER — SODIUM CHLORIDE 0.9 % IV SOLN
INTRAVENOUS | Status: DC | PRN
  Administered 2013-05-08: 17:00:00 via INTRAVENOUS

## 2013-05-08 MED ORDER — ACETAMINOPHEN 10 MG/ML IV SOLN
1000.0000 mg | Freq: Four times a day (QID) | INTRAVENOUS | Status: DC
  Administered 2013-05-08: 1000 mg via INTRAVENOUS

## 2013-05-08 MED ORDER — LACTATED RINGERS IV SOLN
INTRAVENOUS | Status: DC
  Administered 2013-05-08 – 2013-05-10 (×2): via INTRAVENOUS

## 2013-05-08 MED ORDER — PROPOFOL INFUSION 10 MG/ML OPTIME
INTRAVENOUS | Status: DC | PRN
  Administered 2013-05-08: 25 ug/kg/min via INTRAVENOUS

## 2013-05-08 MED ORDER — FENTANYL CITRATE 0.05 MG/ML IJ SOLN
INTRAMUSCULAR | Status: AC
  Filled 2013-05-08: qty 2

## 2013-05-08 MED ORDER — HEMOSTATIC AGENTS (NO CHARGE) OPTIME
TOPICAL | Status: DC | PRN
  Administered 2013-05-08: 1

## 2013-05-08 MED ORDER — CEFAZOLIN SODIUM-DEXTROSE 2-3 GM-% IV SOLR
2.0000 g | INTRAVENOUS | Status: DC
  Filled 2013-05-08: qty 50

## 2013-05-08 MED ORDER — BUPIVACAINE-EPINEPHRINE PF 0.25-1:200000 % IJ SOLN
INTRAMUSCULAR | Status: AC
  Filled 2013-05-08: qty 30

## 2013-05-08 MED ORDER — 0.9 % SODIUM CHLORIDE (POUR BTL) OPTIME
TOPICAL | Status: DC | PRN
  Administered 2013-05-08: 1000 mL

## 2013-05-08 MED ORDER — DOCUSATE SODIUM 100 MG PO CAPS
100.0000 mg | ORAL_CAPSULE | Freq: Two times a day (BID) | ORAL | Status: DC
  Administered 2013-05-09 – 2013-05-13 (×8): 100 mg via ORAL
  Filled 2013-05-08 (×9): qty 1

## 2013-05-08 MED ORDER — PROPOFOL 10 MG/ML IV EMUL
0.0000 ug/kg/min | INTRAVENOUS | Status: DC
  Administered 2013-05-08 (×2): 50 ug/kg/min via INTRAVENOUS
  Administered 2013-05-09: 25 ug/kg/min via INTRAVENOUS
  Filled 2013-05-08: qty 200

## 2013-05-08 MED ORDER — ONDANSETRON HCL 4 MG/2ML IJ SOLN
4.0000 mg | INTRAMUSCULAR | Status: DC | PRN
  Administered 2013-05-09 – 2013-05-13 (×15): 4 mg via INTRAVENOUS
  Filled 2013-05-08 (×15): qty 2

## 2013-05-08 MED ORDER — VANCOMYCIN HCL 1000 MG IV SOLR
INTRAVENOUS | Status: AC
  Filled 2013-05-08: qty 1000

## 2013-05-08 MED ORDER — SODIUM CHLORIDE 0.9 % IJ SOLN: 3.0000 mL | INTRAMUSCULAR | Status: DC | PRN

## 2013-05-08 MED ORDER — LACTATED RINGERS IV SOLN
INTRAVENOUS | Status: DC | PRN
  Administered 2013-05-08 (×4): via INTRAVENOUS

## 2013-05-08 MED ORDER — MIDAZOLAM HCL 5 MG/5ML IJ SOLN
INTRAMUSCULAR | Status: DC | PRN
  Administered 2013-05-08: 2 mg via INTRAVENOUS

## 2013-05-08 MED ORDER — PHENYLEPHRINE HCL 10 MG/ML IJ SOLN
10.0000 mg | INTRAVENOUS | Status: DC | PRN
  Administered 2013-05-08: 20 ug/min via INTRAVENOUS

## 2013-05-08 MED ORDER — GLIPIZIDE ER 5 MG PO TB24
10.0000 mg | ORAL_TABLET | Freq: Every day | ORAL | Status: DC
  Filled 2013-05-08 (×2): qty 2

## 2013-05-08 MED ORDER — PANTOPRAZOLE SODIUM 40 MG PO TBEC: 40.0000 mg | DELAYED_RELEASE_TABLET | Freq: Every day | ORAL | Status: DC

## 2013-05-08 MED ORDER — CARVEDILOL 6.25 MG PO TABS
6.2500 mg | ORAL_TABLET | Freq: Two times a day (BID) | ORAL | Status: DC
  Filled 2013-05-08 (×3): qty 1

## 2013-05-08 MED ORDER — PHENOL 1.4 % MT LIQD: 1.0000 | OROMUCOSAL | Status: DC | PRN

## 2013-05-08 MED ORDER — VANCOMYCIN HCL 1000 MG IV SOLR
INTRAVENOUS | Status: DC | PRN
  Administered 2013-05-08: 1000 mg

## 2013-05-08 MED ORDER — HYDROCHLOROTHIAZIDE 12.5 MG PO CAPS
12.5000 mg | ORAL_CAPSULE | Freq: Every day | ORAL | Status: DC
  Filled 2013-05-08: qty 1

## 2013-05-08 MED ORDER — THROMBIN 5000 UNITS EX SOLR
CUTANEOUS | Status: DC | PRN
  Administered 2013-05-08: 5000 [IU] via TOPICAL

## 2013-05-08 MED ORDER — ALBUMIN HUMAN 5 % IV SOLN
INTRAVENOUS | Status: DC | PRN
  Administered 2013-05-08 (×2): via INTRAVENOUS

## 2013-05-08 MED ORDER — BUPIVACAINE-EPINEPHRINE 0.25% -1:200000 IJ SOLN
INTRAMUSCULAR | Status: DC | PRN
  Administered 2013-05-08: 20 mL

## 2013-05-08 MED ORDER — PANTOPRAZOLE SODIUM 40 MG IV SOLR
40.0000 mg | INTRAVENOUS | Status: DC
  Administered 2013-05-08 – 2013-05-09 (×2): 40 mg via INTRAVENOUS
  Filled 2013-05-08 (×3): qty 40

## 2013-05-08 MED ORDER — ROCURONIUM BROMIDE 100 MG/10ML IV SOLN
INTRAVENOUS | Status: DC | PRN
  Administered 2013-05-08: 50 mg via INTRAVENOUS

## 2013-05-08 MED ORDER — DEXAMETHASONE SODIUM PHOSPHATE 10 MG/ML IJ SOLN
INTRAMUSCULAR | Status: AC
  Filled 2013-05-08: qty 1

## 2013-05-08 MED ORDER — SODIUM CHLORIDE 0.9 % IV SOLN
0.0000 ug/h | INTRAVENOUS | Status: DC
  Administered 2013-05-08: 150 ug/h via INTRAVENOUS
  Administered 2013-05-09: 200 ug/h via INTRAVENOUS
  Filled 2013-05-08 (×3): qty 50

## 2013-05-08 MED ORDER — LISINOPRIL 10 MG PO TABS
10.0000 mg | ORAL_TABLET | Freq: Every day | ORAL | Status: DC
  Filled 2013-05-08: qty 1

## 2013-05-08 MED ORDER — NEOSTIGMINE METHYLSULFATE 1 MG/ML IJ SOLN
INTRAMUSCULAR | Status: DC | PRN
  Administered 2013-05-08: 3 mg via INTRAVENOUS

## 2013-05-08 MED ORDER — LISINOPRIL-HYDROCHLOROTHIAZIDE 10-12.5 MG PO TABS: 1.0000 | ORAL_TABLET | Freq: Every day | ORAL | Status: DC

## 2013-05-08 MED ORDER — OXYCODONE HCL 5 MG PO TABS: 5.0000 mg | ORAL_TABLET | Freq: Once | ORAL | Status: DC | PRN

## 2013-05-08 MED ORDER — OXYCODONE HCL 5 MG/5ML PO SOLN: 5.0000 mg | Freq: Once | ORAL | Status: DC | PRN

## 2013-05-08 MED ORDER — EPHEDRINE SULFATE 50 MG/ML IJ SOLN
INTRAMUSCULAR | Status: DC | PRN
  Administered 2013-05-08 (×3): 5 mg via INTRAVENOUS

## 2013-05-08 MED ORDER — LIDOCAINE HCL (CARDIAC) 20 MG/ML IV SOLN
INTRAVENOUS | Status: DC | PRN
  Administered 2013-05-08: 70 mg via INTRAVENOUS

## 2013-05-08 MED ORDER — CEFAZOLIN SODIUM 1-5 GM-% IV SOLN
INTRAVENOUS | Status: AC
  Filled 2013-05-08: qty 100

## 2013-05-08 MED ORDER — FENTANYL BOLUS VIA INFUSION
50.0000 ug | INTRAVENOUS | Status: DC | PRN
  Filled 2013-05-08: qty 100

## 2013-05-08 MED ORDER — GLIPIZIDE ER 5 MG PO TB24
5.0000 mg | ORAL_TABLET | Freq: Every day | ORAL | Status: DC
  Filled 2013-05-08: qty 1

## 2013-05-08 MED ORDER — SODIUM CHLORIDE 0.9 % IJ SOLN
3.0000 mL | Freq: Two times a day (BID) | INTRAMUSCULAR | Status: DC
Start: 2013-05-08 — End: 2013-05-13
  Administered 2013-05-08: 23:00:00 via INTRAVENOUS
  Administered 2013-05-09 – 2013-05-12 (×4): 3 mL via INTRAVENOUS

## 2013-05-08 MED ORDER — HYDROMORPHONE HCL PF 1 MG/ML IJ SOLN
0.2500 mg | INTRAMUSCULAR | Status: DC | PRN
Start: 2013-05-08 — End: 2013-05-08

## 2013-05-08 MED ORDER — METHOCARBAMOL 500 MG PO TABS
500.0000 mg | ORAL_TABLET | Freq: Four times a day (QID) | ORAL | Status: DC | PRN
  Administered 2013-05-10 – 2013-05-13 (×9): 500 mg via ORAL
  Filled 2013-05-08 (×10): qty 1

## 2013-05-08 MED ORDER — SUCCINYLCHOLINE CHLORIDE 20 MG/ML IJ SOLN
INTRAMUSCULAR | Status: DC | PRN
  Administered 2013-05-08: 100 mg via INTRAVENOUS

## 2013-05-08 MED ORDER — ALBUTEROL SULFATE HFA 108 (90 BASE) MCG/ACT IN AERS
2.0000 | INHALATION_SPRAY | Freq: Four times a day (QID) | RESPIRATORY_TRACT | Status: DC | PRN
  Filled 2013-05-08: qty 6.7

## 2013-05-08 MED ORDER — THROMBIN 5000 UNITS EX SOLR
CUTANEOUS | Status: AC
  Filled 2013-05-08: qty 5000

## 2013-05-08 MED ORDER — NITROFURANTOIN MACROCRYSTAL 50 MG PO CAPS
50.0000 mg | ORAL_CAPSULE | Freq: Every day | ORAL | Status: DC
  Administered 2013-05-09 – 2013-05-13 (×5): 50 mg via ORAL
  Filled 2013-05-08 (×6): qty 1

## 2013-05-08 MED ORDER — THROMBIN 20000 UNITS EX SOLR
CUTANEOUS | Status: DC | PRN
  Administered 2013-05-08: 10:00:00 via TOPICAL

## 2013-05-08 MED ORDER — DEXAMETHASONE SODIUM PHOSPHATE 4 MG/ML IJ SOLN: 4.0000 mg | Freq: Once | INTRAMUSCULAR | Status: DC

## 2013-05-08 MED ORDER — SODIUM CHLORIDE 0.9 % IV SOLN: 250.0000 mL | INTRAVENOUS | Status: DC

## 2013-05-08 MED ORDER — INSULIN ASPART 100 UNIT/ML ~~LOC~~ SOLN
0.0000 [IU] | SUBCUTANEOUS | Status: DC
  Administered 2013-05-08: 2 [IU] via SUBCUTANEOUS
  Administered 2013-05-09: 5 [IU] via SUBCUTANEOUS
  Administered 2013-05-09: 2.5 [IU] via SUBCUTANEOUS
  Administered 2013-05-09: 2 [IU] via SUBCUTANEOUS
  Administered 2013-05-10 (×4): 3 [IU] via SUBCUTANEOUS
  Administered 2013-05-10: 5 [IU] via SUBCUTANEOUS
  Administered 2013-05-10 – 2013-05-11 (×2): 3 [IU] via SUBCUTANEOUS
  Administered 2013-05-11: 2 [IU] via SUBCUTANEOUS
  Administered 2013-05-11: 3 [IU] via SUBCUTANEOUS
  Administered 2013-05-11: 17:00:00 via SUBCUTANEOUS
  Administered 2013-05-11: 2 [IU] via SUBCUTANEOUS

## 2013-05-08 MED ORDER — GLYCOPYRROLATE 0.2 MG/ML IJ SOLN
INTRAMUSCULAR | Status: DC | PRN
  Administered 2013-05-08: 0.4 mg via INTRAVENOUS

## 2013-05-08 MED ORDER — HEMOSTATIC AGENTS (NO CHARGE) OPTIME
TOPICAL | Status: DC | PRN
  Administered 2013-05-08: 1 via TOPICAL

## 2013-05-08 MED ORDER — METFORMIN HCL 500 MG PO TABS
1000.0000 mg | ORAL_TABLET | Freq: Two times a day (BID) | ORAL | Status: DC
  Filled 2013-05-08 (×3): qty 2

## 2013-05-08 MED ORDER — PROPOFOL 10 MG/ML IV BOLUS
INTRAVENOUS | Status: DC | PRN
  Administered 2013-05-08: 140 mg via INTRAVENOUS
  Administered 2013-05-08: 60 mg via INTRAVENOUS

## 2013-05-08 MED ORDER — FENTANYL CITRATE 0.05 MG/ML IJ SOLN
100.0000 ug | INTRAMUSCULAR | Status: DC | PRN
  Administered 2013-05-08: 100 ug via INTRAVENOUS

## 2013-05-08 MED ORDER — CEFAZOLIN SODIUM 1-5 GM-% IV SOLN
1.0000 g | Freq: Three times a day (TID) | INTRAVENOUS | Status: DC
  Administered 2013-05-09 – 2013-05-10 (×4): 1 g via INTRAVENOUS
  Filled 2013-05-08 (×6): qty 50

## 2013-05-08 MED ORDER — FLECAINIDE ACETATE 50 MG PO TABS
50.0000 mg | ORAL_TABLET | Freq: Two times a day (BID) | ORAL | Status: DC
  Administered 2013-05-09 – 2013-05-13 (×8): 50 mg via ORAL
  Filled 2013-05-08 (×13): qty 1

## 2013-05-08 MED ORDER — METHOCARBAMOL 100 MG/ML IJ SOLN
500.0000 mg | Freq: Four times a day (QID) | INTRAVENOUS | Status: DC | PRN
  Filled 2013-05-08: qty 5

## 2013-05-08 MED FILL — Heparin Sodium (Porcine) Inj 1000 Unit/ML: INTRAMUSCULAR | Qty: 30 | Status: AC

## 2013-05-08 MED FILL — Sodium Chloride IV Soln 0.9%: INTRAVENOUS | Qty: 1000 | Status: AC

## 2013-05-08 SURGICAL SUPPLY — 87 items
BLADE SURG ROTATE 9660 (MISCELLANEOUS) IMPLANT
BUR EGG ELITE 4.0 (BURR) ×2 IMPLANT
BUR NEURO DRILL SOFT 3.0X3.8M (BURR) ×1 IMPLANT
CLOTH BEACON ORANGE TIMEOUT ST (SAFETY) ×2 IMPLANT
CLSR STERI-STRIP ANTIMIC 1/2X4 (GAUZE/BANDAGES/DRESSINGS) ×1 IMPLANT
CONNECTOR EXPEDIUM SFX SZ A5 (Orthopedic Implant) ×1 IMPLANT
CONNECTOR EXPEDIUM SFX SZ A6 (Orthopedic Implant) ×1 IMPLANT
CONT SPEC 4OZ CLIKSEAL STRL BL (MISCELLANEOUS) ×1 IMPLANT
CORDS BIPOLAR (ELECTRODE) ×2 IMPLANT
COVER MAYO STAND STRL (DRAPES) ×2 IMPLANT
COVER SURGICAL LIGHT HANDLE (MISCELLANEOUS) ×2 IMPLANT
DRAIN CHANNEL 19F RND (DRAIN) ×1 IMPLANT
DRAPE C-ARM 42X72 X-RAY (DRAPES) ×2 IMPLANT
DRAPE INCISE IOBAN 66X45 STRL (DRAPES) ×1 IMPLANT
DRAPE POUCH INSTRU U-SHP 10X18 (DRAPES) ×2 IMPLANT
DRAPE SURG 17X23 STRL (DRAPES) ×1 IMPLANT
DRAPE U-SHAPE 47X51 STRL (DRAPES) ×3 IMPLANT
DRSG MEPILEX BORDER 4X12 (GAUZE/BANDAGES/DRESSINGS) ×2 IMPLANT
DRSG MEPILEX BORDER 4X8 (GAUZE/BANDAGES/DRESSINGS) ×1 IMPLANT
DURAPREP 26ML APPLICATOR (WOUND CARE) ×2 IMPLANT
ELECT BLADE 4.0 EZ CLEAN MEGAD (MISCELLANEOUS) ×2
ELECT REM PT RETURN 9FT ADLT (ELECTROSURGICAL) ×2
ELECTRODE BLDE 4.0 EZ CLN MEGD (MISCELLANEOUS) IMPLANT
ELECTRODE REM PT RTRN 9FT ADLT (ELECTROSURGICAL) ×1 IMPLANT
EVACUATOR SILICONE 100CC (DRAIN) ×1 IMPLANT
GLOVE BIOGEL PI IND STRL 6.5 (GLOVE) IMPLANT
GLOVE BIOGEL PI IND STRL 7.0 (GLOVE) IMPLANT
GLOVE BIOGEL PI IND STRL 8 (GLOVE) ×1 IMPLANT
GLOVE BIOGEL PI IND STRL 8.5 (GLOVE) ×1 IMPLANT
GLOVE BIOGEL PI INDICATOR 6.5 (GLOVE) ×2
GLOVE BIOGEL PI INDICATOR 7.0 (GLOVE) ×3
GLOVE BIOGEL PI INDICATOR 8 (GLOVE)
GLOVE BIOGEL PI INDICATOR 8.5 (GLOVE) ×2
GLOVE ECLIPSE 8.5 STRL (GLOVE) ×6 IMPLANT
GLOVE ORTHO TXT STRL SZ7.5 (GLOVE) ×1 IMPLANT
GLOVE SKINSENSE STRL SZ6.0 (GLOVE) ×1 IMPLANT
GLOVE SURG SS PI 6.5 STRL IVOR (GLOVE) ×2 IMPLANT
GOWN PREVENTION PLUS XLARGE (GOWN DISPOSABLE) ×2 IMPLANT
GOWN PREVENTION PLUS XXLARGE (GOWN DISPOSABLE) ×3 IMPLANT
GOWN STRL NON-REIN LRG LVL3 (GOWN DISPOSABLE) ×1 IMPLANT
GOWN STRL REIN 2XL XLG LVL4 (GOWN DISPOSABLE) ×1 IMPLANT
GOWN STRL REIN XL XLG (GOWN DISPOSABLE) ×3 IMPLANT
KIT BASIN OR (CUSTOM PROCEDURE TRAY) ×2 IMPLANT
KIT ORACLE NEUROMONITING (KITS) ×2 IMPLANT
KIT ROOM TURNOVER OR (KITS) ×2 IMPLANT
KIT STIMULAN RAPID CURE  10CC (Orthopedic Implant) ×1 IMPLANT
KIT STIMULAN RAPID CURE 10CC (Orthopedic Implant) IMPLANT
NDL SPNL 18GX3.5 QUINCKE PK (NEEDLE) ×1 IMPLANT
NEEDLE 22X1 1/2 (OR ONLY) (NEEDLE) ×2 IMPLANT
NEEDLE SPNL 18GX3.5 QUINCKE PK (NEEDLE) ×2 IMPLANT
NEURO MONITORING STIM (LABOR (TRAVEL & OVERTIME)) ×1 IMPLANT
NS IRRIG 1000ML POUR BTL (IV SOLUTION) ×2 IMPLANT
PACK LAMINECTOMY ORTHO (CUSTOM PROCEDURE TRAY) ×2 IMPLANT
PACK UNIVERSAL I (CUSTOM PROCEDURE TRAY) ×2 IMPLANT
PAD ARMBOARD 7.5X6 YLW CONV (MISCELLANEOUS) ×4 IMPLANT
PATTIES SURGICAL .5 X.5 (GAUZE/BANDAGES/DRESSINGS) IMPLANT
PATTIES SURGICAL .5 X1 (DISPOSABLE) ×2 IMPLANT
ROD EXPEDIUM 480MM (Rod) ×2 IMPLANT
SCREW EXPEDIUM 7X40MM (Screw) ×2 IMPLANT
SCREW POLY EXPEDIUM 5.5 5X40MM (Screw) ×7 IMPLANT
SCREW POLY EXPEDIUM 5.5 6X40MM (Screw) ×3 IMPLANT
SCREW POLY EXPEDIUM 5.5 6X45MM (Screw) ×4 IMPLANT
SCREW POLY FAS EXPEDIUM 5X35MM (Screw) ×2 IMPLANT
SCREW SET EXPEDIUM 8MM (Screw) ×18 IMPLANT
SHEET CONFORM 45LX20WX7H (Bone Implant) ×4 IMPLANT
SPONGE GAUZE 4X4 12PLY (GAUZE/BANDAGES/DRESSINGS) ×2 IMPLANT
SPONGE LAP 4X18 X RAY DECT (DISPOSABLE) ×10 IMPLANT
SPONGE SURGIFOAM ABS GEL 100 (HEMOSTASIS) ×2 IMPLANT
STRIP CLOSURE SKIN 1/2X4 (GAUZE/BANDAGES/DRESSINGS) ×3 IMPLANT
SURGIFLO TRUKIT (HEMOSTASIS) ×2 IMPLANT
SUT ETHILON 3 0 PS 1 (SUTURE) ×1 IMPLANT
SUT MON AB 3-0 SH 27 (SUTURE) ×2
SUT MON AB 3-0 SH27 (SUTURE) ×1 IMPLANT
SUT VIC AB 0 CT1 27 (SUTURE) ×2
SUT VIC AB 0 CT1 27XBRD ANBCTR (SUTURE) IMPLANT
SUT VIC AB 1 CTX 18 (SUTURE) ×1 IMPLANT
SUT VIC AB 1 CTX 36 (SUTURE) ×6
SUT VIC AB 1 CTX36XBRD ANBCTR (SUTURE) ×2 IMPLANT
SUT VIC AB 2-0 CT1 18 (SUTURE) ×5 IMPLANT
SYR BULB IRRIGATION 50ML (SYRINGE) ×2 IMPLANT
SYR CONTROL 10ML LL (SYRINGE) ×3 IMPLANT
TAPE CLOTH SURG 4X10 WHT LF (GAUZE/BANDAGES/DRESSINGS) ×1 IMPLANT
TOWEL OR 17X24 6PK STRL BLUE (TOWEL DISPOSABLE) ×2 IMPLANT
TOWEL OR 17X26 10 PK STRL BLUE (TOWEL DISPOSABLE) ×2 IMPLANT
TRAY FOLEY CATH 16FRSI W/METER (SET/KITS/TRAYS/PACK) ×2 IMPLANT
WATER STERILE IRR 1000ML POUR (IV SOLUTION) ×2 IMPLANT
YANKAUER SUCT BULB TIP NO VENT (SUCTIONS) ×2 IMPLANT

## 2013-05-08 NOTE — H&P (Signed)
No change in clinical exam H+P reviewed  

## 2013-05-08 NOTE — Anesthesia Postprocedure Evaluation (Signed)
  Anesthesia Post-op Note  Patient: [name]  Procedure(s) Performed: Procedure(s): T9-S1 INSTRUMENTED FUSION T12 -S1 DECOMPRESSION (N/A)  Patient Location: ICU  Anesthesia Type:General  Level of Consciousness: sedated  Airway and Oxygen Therapy: Patient remains intubated per anesthesia plan and Patient placed on Ventilator (see vital sign flow sheet for setting)  Post-op Pain: none  Post-op Assessment: Post-op Vital signs reviewed, Patient's Cardiovascular Status Stable, Respiratory Function Stable, Patent Airway, No signs of Nausea or vomiting and Pain level controlled  Post-op Vital Signs: Reviewed and stable  Complications: No apparent anesthesia complications

## 2013-05-08 NOTE — Anesthesia Procedure Notes (Signed)
Procedure Name: Intubation Date/Time: 05/08/2013 8:41 AM Performed by: [place] Pink Pre-anesthesia Checklist: Patient identified, Emergency Drugs available, Suction available, Patient being monitored and Timeout performed Patient Re-evaluated:Patient Re-evaluated prior to inductionOxygen Delivery Method: Circle system utilized Preoxygenation: Pre-oxygenation with 100% oxygen Intubation Type: IV induction Ventilation: Mask ventilation without difficulty Laryngoscope Size: Mac and 3 Grade View: Grade II Tube type: Oral Tube size: 7.5 mm Number of attempts: 1 Airway Equipment and Method: LTA kit utilized and Bite block (Soft bite block utilized) Placement Confirmation: ETT inserted through vocal cords under direct vision,  positive ETCO2 and breath sounds checked- equal and bilateral Secured at: 20 cm Tube secured with: Tape Dental Injury: Teeth and Oropharynx as per pre-operative assessment

## 2013-05-08 NOTE — Transfer of Care (Signed)
Immediate Anesthesia Transfer of Care Note  Patient: [name]  Procedure(s) Performed: Procedure(s): T9-S1 INSTRUMENTED FUSION T12 -S1 DECOMPRESSION (N/A)  Patient Location: SICU  Anesthesia Type:General  Level of Consciousness: sedated  Airway & Oxygen Therapy: Patient remains intubated per anesthesia plan and Patient placed on Ventilator (see vital sign flow sheet for setting)  Post-op Assessment: Report given to PACU RN and Post -op Vital signs reviewed and stable  Post vital signs: Reviewed and stable  Complications: No apparent anesthesia complications

## 2013-05-08 NOTE — Consult Note (Signed)
PULMONARY  / CRITICAL CARE MEDICINE  Name: [name] MRN: [id number] DOB: 09-22-1955    ADMISSION DATE:  05/08/2013 CONSULTATION DATE:  05/08/2013  REFERRING MD :  Clayborn Bigness PRIMARY SERVICE: Ortho  CHIEF COMPLAINT:  VDRF  BRIEF PATIENT DESCRIPTION: 57 year old diabetic and asthmatic patient presenting to the hospital for spinal fusion S/P 12 hour procedure for fusion in a prone position who was left intubated by anesthesia and PCCM consulted for vent management.  SIGNIFICANT EVENTS / STUDIES:  VDRF post back fusion 11/5  LINES / TUBES: ET tube 11/5>>> PIV  CULTURES: None  ANTIBIOTICS: Cefazolin 11/5>>> Nitrofurantoin 11/5>>>  PAST MEDICAL HISTORY :  Past Medical History  Diagnosis Date  . Diabetes mellitus without complication   . Breast cancer   . PONV (postoperative nausea and vomiting)   . Dysrhythmia     atrial fib/dr [place] [facility]  . Asthma   . Pneumonia     hx  . Heart murmur   . History of recurrent UTIs   . Sepsis 05    kidney stone infection  . GERD (gastroesophageal reflux disease)   . Headache(784.0)   . Arthritis    Past Surgical History  Procedure Laterality Date  . Breast surgery Left 02    lumpectomy   . Tubal ligation  85  . Ovarian cyst surgery Bilateral 06    oophorectomy   . Lithotripsy      x2-3   Prior to Admission medications   Medication Sig Start Date End Date Taking? Authorizing Provider  Ascorbic Acid (VITAMIN C PO) Take 1 tablet by mouth daily.   Yes Historical Provider, MD  carvedilol (COREG) 6.25 MG tablet Take 6.25 mg by mouth 2 (two) times daily with a meal.  04/01/13  Yes Historical Provider, MD  flecainide (TAMBOCOR) 50 MG tablet Take 50 mg by mouth 2 (two) times daily.  03/31/13  Yes Historical Provider, MD  glipiZIDE (GLUCOTROL XL) 5 MG 24 hr tablet Take 5-10 mg by mouth 2 (two) times daily. 2 tablets every morning and 1 tablet every evening   Yes Historical Provider, MD  lisinopril-hydrochlorothiazide  (PRINZIDE,ZESTORETIC) 10-12.5 MG per tablet Take 1 tablet by mouth daily.  04/01/13  Yes Historical Provider, MD  loratadine (CLARITIN) 10 MG tablet Take 10 mg by mouth daily.   Yes Historical Provider, MD  metFORMIN (GLUCOPHAGE) 1000 MG tablet Take 1,000 mg by mouth 2 (two) times daily with a meal.   Yes Historical Provider, MD  methocarbamol (ROBAXIN) 500 MG tablet Take 500 mg by mouth 3 (three) times daily as needed (muscle spasm). Muscle relaxent 02/02/12  Yes Historical Provider, MD  nitrofurantoin (MACRODANTIN) 50 MG capsule Take 50 mg by mouth every morning.  04/08/13  Yes Historical Provider, MD  omeprazole (PRILOSEC) 20 MG capsule Take 20 mg by mouth.  03/31/13  Yes Historical Provider, MD  rosuvastatin (CRESTOR) 10 MG tablet Take 10 mg by mouth at bedtime.   Yes Historical Provider, MD  sitaGLIPtin (JANUVIA) 100 MG tablet Take 100 mg by mouth daily.   Yes Historical Provider, MD  tiZANidine (ZANAFLEX) 4 MG tablet Take 4 mg by mouth at bedtime.  03/15/12  Yes Historical Provider, MD  traMADol (ULTRAM) 50 MG tablet Take 50 mg by mouth every 6 (six) hours as needed for pain.  03/15/12  Yes Historical Provider, MD  XARELTO 20 MG TABS tablet Take 20 mg by mouth daily.  02/28/13  Yes Historical Provider, MD  albuterol (PROVENTIL HFA;VENTOLIN HFA) 108 (90  BASE) MCG/ACT inhaler Inhale 2 puffs into the lungs every 6 (six) hours as needed for wheezing or shortness of breath.    Historical Provider, MD  fluticasone (FLONASE) 50 MCG/ACT nasal spray Place 2 sprays into the nose daily as needed for rhinitis or allergies.    Historical Provider, MD  Multiple Vitamin (MULTIVITAMIN WITH MINERALS) TABS tablet Take 1 tablet by mouth daily.    Historical Provider, MD   Allergies  Allergen Reactions  . Other Nausea And Vomiting    Shrimp if eat a lot  . Cleocin [Clindamycin Hcl] Other (See Comments)    "irritated my esophagus"  . Morphine And Related Nausea And Vomiting  . Pneumococcal Vaccines Other (See  Comments)    Really high fever, and flu symptoms. MD instructed not to give    FAMILY HISTORY:  No family history on file. SOCIAL HISTORY:  reports that she has never smoked. She does not have any smokeless tobacco history on file. She reports that she does not drink alcohol or use illicit drugs.  REVIEW OF SYSTEMS:  Sedated, unable to obtain.  SUBJECTIVE:   VITAL SIGNS: Temp:  [97.3 F (36.3 C)] 97.3 F (36.3 C) (11/05 0710) Pulse Rate:  [81] 81 (11/05 0710) Resp:  [18] 18 (11/05 0710) BP: (127)/(80) 127/80 mmHg (11/05 0710) SpO2:  [100 %] 100 % (11/05 0710) Weight:  [72.15 kg (159 lb 1 oz)] 72.15 kg (159 lb 1 oz) (11/05 1900) HEMODYNAMICS:   VENTILATOR SETTINGS:   INTAKE / OUTPUT: Intake/Output     11/05 0701 - 11/06 0700   I.V. (mL/kg) 5500 (76.2)   Blood 1300   IV Piggyback 500   Total Intake(mL/kg) 7300 (101.2)   Urine (mL/kg/hr) 875 (0.8)   Blood 750 (0.7)   Total Output 1625   Net +5675         PHYSICAL EXAMINATION: General:  Edematous, well appearing, sedated. Neuro:  Sedated, intubated and paralyzed. HEENT:  [place]/AT, PERRL, EOM-I and MMM. Cardiovascular:  RRR, Nl S1/S2, -M/R/G. Lungs:  Coarse BS diffusely. Abdomen:  Soft, NT, ND and +BS. Musculoskeletal:  -edema and -tenderness. Skin:  Intact.  LABS:  CBC Recent Labs     05/08/13  1531  05/08/13  1657  05/08/13  1810  HGB  5.8*  8.5*  10.2*  HCT  17.0*  25.0*  30.0*   Coag's No results found for this basename: APTT, INR,  in the last 72 hours BMET Recent Labs     05/08/13  1531  05/08/13  1657  05/08/13  1810  NA  140  139  141  K  3.9  4.3  4.5  GLUCOSE  148*  143*  150*   Electrolytes No results found for this basename: CALCIUM, MG, PHOS,  in the last 72 hours Sepsis Markers No results found for this basename: LACTICACIDVEN, PROCALCITON, O2SATVEN,  in the last 72 hours ABG Recent Labs     05/08/13  2053  PHART  7.309*  PCO2ART  44.5  PO2ART  112.0*   Liver Enzymes No  results found for this basename: AST, ALT, ALKPHOS, BILITOT, ALBUMIN,  in the last 72 hours Cardiac Enzymes No results found for this basename: TROPONINI, PROBNP,  in the last 72 hours Glucose Recent Labs     05/08/13  0712  GLUCAP  86    Imaging Dg Lumbar Spine Complete  05/08/2013   CLINICAL DATA:  64-year- female undergoing T9-S1 posterior spinal fusion. Initial encounter.  EXAM: DG C-ARM GT 120  MIN; LUMBAR SPINE - COMPLETE 4+ VIEW  TECHNIQUE: Eleven intraoperative fluoroscopic views of the spine.  CONTRAST:  None.  COMPARISON:  Chest radiographs 05/03/2013. CT Abdomen and Pelvis 07/12/2012.  FLUOROSCOPY TIME:  7 min and 26 seconds.  FINDINGS: These images demonstrate widespread transpedicular screw placement in the thoracic and lumbar spine with posterior connecting rods placed. Transpedicular screws extend to the S1 level. Enteric tube is in place. The most cephalad thoracic level demonstrates a unilateral right pedicle screw.  IMPRESSION: Widespread thoracolumbar posterior spinal fusion depicted.   Electronically Signed   By: Lars Pinks M.D.   On: 05/08/2013 19:33   Dg Chest Port 1 View  05/08/2013   CLINICAL DATA:  Respiratory failure, reset thoracic and lumbar spinal fusion  EXAM: PORTABLE CHEST - 1 VIEW  COMPARISON:  05/03/2013  FINDINGS: Endotracheal tube 13 mm above the Carina. NG tube below the hemidiaphragms extending into the stomach. Mild cardiac enlargement with low lung volumes. Streaky left base atelectasis noted. No effusion or pneumothorax. Atherosclerosis of the aorta.  IMPRESSION: Support apparatus as above.  Low volume exam with left base atelectasis.   Electronically Signed   By: Daryll Brod M.D.   On: 05/08/2013 20:53   Dg C-arm Gt 120 Min  05/08/2013   CLINICAL DATA:  26-year- female undergoing T9-S1 posterior spinal fusion. Initial encounter.  EXAM: DG C-ARM GT 120 MIN; LUMBAR SPINE - COMPLETE 4+ VIEW  TECHNIQUE: Eleven intraoperative fluoroscopic views of the spine.   CONTRAST:  None.  COMPARISON:  Chest radiographs 05/03/2013. CT Abdomen and Pelvis 07/12/2012.  FLUOROSCOPY TIME:  7 min and 26 seconds.  FINDINGS: These images demonstrate widespread transpedicular screw placement in the thoracic and lumbar spine with posterior connecting rods placed. Transpedicular screws extend to the S1 level. Enteric tube is in place. The most cephalad thoracic level demonstrates a unilateral right pedicle screw.  IMPRESSION: Widespread thoracolumbar posterior spinal fusion depicted.   Electronically Signed   By: Lars Pinks M.D.   On: 05/08/2013 19:33     CXR: ET tube 1 cm from carina.  ASSESSMENT / PLAN:  PULMONARY A: VDRF post 12 hours surgery. P:   - Full vent support. - ABG and CXR. - SBT in AM.  CARDIOVASCULAR A: HTN history. P:  - Continue home medications. - Sedation for now, do not increase anti-HTN.  RENAL A:  No known issues. P:   - BMET now and in AM. - Replace electrolytes.  GASTROINTESTINAL A:  No active issues. P:   - If not extubated by AM will need TF.  HEMATOLOGIC A:  No active issues, leukocytosis likely reactive. P:  - CBC in AM.  INFECTIOUS A:  No active issues. P:   - Prophylaxis per surgery.  ENDOCRINE A:  DM by history.   P:   - CBGs and ISS.  NEUROLOGIC A:  Sedated. P:   - Sedation and pain control.  I have personally obtained a history, examined the patient, evaluated laboratory and imaging results, formulated the assessment and plan and placed orders.  CRITICAL CARE: The patient is critically ill with multiple organ systems failure and requires high complexity decision making for assessment and support, frequent evaluation and titration of therapies, application of advanced monitoring technologies and extensive interpretation of multiple databases. Critical Care Time devoted to patient care services described in this note is 40 minutes.   Rush Farmer, M.D. Pulmonary and [place] Pager: [phone]  05/08/2013, 9:29 PM

## 2013-05-08 NOTE — Anesthesia Preprocedure Evaluation (Signed)
Anesthesia Evaluation  Patient identified by MRN, date of birth, ID band Patient awake    Reviewed: Allergy & Precautions, H&P   History of Anesthesia Complications (+) PONV  Airway Mallampati: I  Neck ROM: Full    Dental  (+) Teeth Intact   Pulmonary asthma ,  breath sounds clear to auscultation        Cardiovascular + dysrhythmias Atrial Fibrillation Rhythm:Regular Rate:Normal     Neuro/Psych  Headaches,    GI/Hepatic Neg liver ROS, GERD-  ,  Endo/Other  diabetes, Well Controlled, Type 2, Oral Hypoglycemic Agents  Renal/GU negative Renal ROS     Musculoskeletal   Abdominal   Peds  Hematology negative hematology ROS (+)   Anesthesia Other Findings   Reproductive/Obstetrics                           Anesthesia Physical Anesthesia Plan  ASA: III  Anesthesia Plan: General   Post-op Pain Management:    Induction: Intravenous  Airway Management Planned: Oral ETT  Additional Equipment: Arterial line  Intra-op Plan:   Post-operative Plan: Extubation in OR  Informed Consent: I have reviewed the patients History and Physical, chart, labs and discussed the procedure including the risks, benefits and alternatives for the proposed anesthesia with the patient or authorized representative who has indicated his/her understanding and acceptance.   Dental advisory given  Plan Discussed with: CRNA and Surgeon  Anesthesia Plan Comments:         Anesthesia Quick Evaluation

## 2013-05-08 NOTE — Brief Op Note (Signed)
05/08/2013  7:44 PM  PATIENT:  [name]  57 y.o. female  PRE-OPERATIVE DIAGNOSIS:  DEGENERATIVE SCOLOSIS WITH STENOSIS   POST-OPERATIVE DIAGNOSIS:  DEGENERATIVE SCOLOSIS WITH STENOSIS   PROCEDURE:  Procedure(s): T9-S1 INSTRUMENTED FUSION T12 -S1 DECOMPRESSION (N/A)  SURGEON:  Surgeon(s) and Role:    * Melina Schools, MD - Primary  PHYSICIAN ASSISTANT:   ASSISTANTS: RNFA   ANESTHESIA:   general  EBL:  Total I/O In: 1000 [I.V.:1000] Out: -   BLOOD ADMINISTERED:3 units CC PRBC and 250 CC CELLSAVER  DRAINS: Penrose drain in the back   LOCAL MEDICATIONS USED:  MARCAINE     SPECIMEN:  No Specimen  DISPOSITION OF SPECIMEN:  N/A  COUNTS:  YES  TOURNIQUET:  * No tourniquets in log *  DICTATION: .Other Dictation: Dictation Number [id number]  PLAN OF CARE: Admit to inpatient   PATIENT DISPOSITION:  ICU - intubated and hemodynamically stable.

## 2013-05-08 NOTE — Preoperative (Signed)
Beta Blockers   Reason not to administer Beta Blockers:Not Applicable 

## 2013-05-08 NOTE — Progress Notes (Signed)
elink first hour from admission review  Prolnged 11h surgery L-S spine Post op followed commands per Dr Rolena Infante 400cc blood loss ? Transfused in OR Brought to ICU intubated but maintaining BP  . has a past medical history of Diabetes mellitus without complication; Breast cancer; PONV (postoperative nausea and vomiting); Dysrhythmia; Asthma; Pneumonia; Heart murmur; History of recurrent UTIs; Sepsis (05); GERD (gastroesophageal reflux disease); Headache(784.0); and Arthritis.   has past surgical history that includes Breast surgery (Left, 02); Tubal ligation (85); Ovarian cyst surgery (Bilateral, 06); and Lithotripsy.   Camera exam operning eyes Moving all 4s On diprivan Dysnch with vent RNs applyuing restraiing  A Acute post op resop failure  P Restraints Sedation with fent/diprivan Check cxr, cbc, bmet,, trop, lactate,. Abg, inr SCD, protonix PRVC vent support Rest per bedside MD Dr Nelda Marseille will be with patient; curently tied up else where   Dr. Brand Males, M.D., [facility].C.P Pulmonary and Critical Care Medicine Staff Physician [place] [facility] Pager: [phone], If no answer or between  15:00h - 7:00h: call [phone]  05/08/2013 8:27 PM

## 2013-05-09 ENCOUNTER — Encounter (HOSPITAL_COMMUNITY): Payer: Self-pay

## 2013-05-09 ENCOUNTER — Inpatient Hospital Stay (HOSPITAL_COMMUNITY): Payer: Federal, State, Local not specified - PPO

## 2013-05-09 ENCOUNTER — Other Ambulatory Visit: Payer: Self-pay

## 2013-05-09 DIAGNOSIS — C50919 Malignant neoplasm of unspecified site of unspecified female breast: Secondary | ICD-10-CM

## 2013-05-09 LAB — POCT I-STAT 3, ART BLOOD GAS (G3+)
Acid-base deficit: 1 mmol/L (ref 0.0–2.0)
O2 Saturation: 99 %
Patient temperature: 98
Patient temperature: 99.7
TCO2: 22 mmol/L (ref 0–100)
pCO2 arterial: 27 mmHg — ABNORMAL LOW (ref 35.0–45.0)
pCO2 arterial: 45.1 mmHg — ABNORMAL HIGH (ref 35.0–45.0)
pH, Arterial: 7.326 — ABNORMAL LOW (ref 7.350–7.450)
pO2, Arterial: 117 mmHg — ABNORMAL HIGH (ref 80.0–100.0)
pO2, Arterial: 124 mmHg — ABNORMAL HIGH (ref 80.0–100.0)

## 2013-05-09 LAB — GLUCOSE, CAPILLARY
Glucose-Capillary: 122 mg/dL — ABNORMAL HIGH (ref 70–99)
Glucose-Capillary: 221 mg/dL — ABNORMAL HIGH (ref 70–99)
Glucose-Capillary: 158 mg/dL — ABNORMAL HIGH (ref 70–99)

## 2013-05-09 LAB — CBC WITH DIFFERENTIAL/PLATELET
Basophils Absolute: 0 10*3/uL (ref 0.0–0.1)
Basophils Relative: 0 % (ref 0–1)
HCT: 29.9 % — ABNORMAL LOW (ref 36.0–46.0)
Lymphocytes Relative: 19 % (ref 12–46)
Lymphs Abs: 1.6 10*3/uL (ref 0.7–4.0)
MCHC: 35.1 g/dL (ref 30.0–36.0)
Neutro Abs: 6.2 10*3/uL (ref 1.7–7.7)
Platelets: 116 10*3/uL — ABNORMAL LOW (ref 150–400)
RBC: 3.43 MIL/uL — ABNORMAL LOW (ref 3.87–5.11)
RDW: 14 % (ref 11.5–15.5)
WBC: 8.4 10*3/uL (ref 4.0–10.5)

## 2013-05-09 LAB — PHOSPHORUS: Phosphorus: 3.1 mg/dL (ref 2.3–4.6)

## 2013-05-09 LAB — CBC
HCT: 31.8 % — ABNORMAL LOW (ref 36.0–46.0)
Hemoglobin: 11.4 g/dL — ABNORMAL LOW (ref 12.0–15.0)
MCHC: 35.8 g/dL (ref 30.0–36.0)
Platelets: 110 10*3/uL — ABNORMAL LOW (ref 150–400)
WBC: 9 10*3/uL (ref 4.0–10.5)

## 2013-05-09 LAB — BASIC METABOLIC PANEL
CO2: 21 mEq/L (ref 19–32)
Calcium: 7.7 mg/dL — ABNORMAL LOW (ref 8.4–10.5)
Chloride: 107 mEq/L (ref 96–112)
GFR calc Af Amer: >90 mL/min (ref >90)
Sodium: 140 mEq/L (ref 135–145)

## 2013-05-09 LAB — TROPONIN I
Troponin I: <0.3 ng/mL (ref <0.30)
Troponin I: <0.3 ng/mL (ref <0.30)

## 2013-05-09 LAB — MAGNESIUM: Magnesium: 0.6 mg/dL — CL (ref 1.5–2.5)

## 2013-05-09 MED ORDER — NALOXONE HCL 0.4 MG/ML IJ SOLN: 0.4000 mg | INTRAMUSCULAR | Status: DC | PRN

## 2013-05-09 MED ORDER — FENTANYL 10 MCG/ML IV SOLN
INTRAVENOUS | Status: DC
  Administered 2013-05-09: 90 ug via INTRAVENOUS
  Administered 2013-05-09: 12:00:00 via INTRAVENOUS
  Administered 2013-05-10 (×2): 50 ug via INTRAVENOUS
  Filled 2013-05-09: qty 50

## 2013-05-09 MED ORDER — OXYCODONE-ACETAMINOPHEN 5-325 MG PO TABS
1.0000 | ORAL_TABLET | ORAL | Status: DC | PRN
  Administered 2013-05-10 – 2013-05-13 (×16): 2 via ORAL
  Filled 2013-05-09 (×7): qty 2
  Filled 2013-05-09: qty 1
  Filled 2013-05-09 (×9): qty 2

## 2013-05-09 MED ORDER — MAGNESIUM SULFATE 50 % IJ SOLN
6.0000 g | Freq: Once | INTRAVENOUS | Status: AC
  Administered 2013-05-09: 6 g via INTRAVENOUS
  Filled 2013-05-09: qty 12

## 2013-05-09 MED ORDER — DIPHENHYDRAMINE HCL 12.5 MG/5ML PO ELIX
12.5000 mg | ORAL_SOLUTION | Freq: Four times a day (QID) | ORAL | Status: DC | PRN
  Filled 2013-05-09: qty 5

## 2013-05-09 MED ORDER — OXYCODONE HCL 5 MG PO TABS
5.0000 mg | ORAL_TABLET | ORAL | Status: DC | PRN
  Administered 2013-05-10 – 2013-05-13 (×7): 5 mg via ORAL
  Filled 2013-05-09 (×7): qty 1

## 2013-05-09 MED ORDER — POTASSIUM CHLORIDE 20 MEQ/15ML (10%) PO LIQD
40.0000 meq | Freq: Once | ORAL | Status: AC
  Administered 2013-05-09: 40 meq
  Filled 2013-05-09: qty 30

## 2013-05-09 MED ORDER — SODIUM CHLORIDE 0.9 % IJ SOLN: 9.0000 mL | INTRAMUSCULAR | Status: DC | PRN

## 2013-05-09 MED ORDER — DIPHENHYDRAMINE HCL 50 MG/ML IJ SOLN: 12.5000 mg | Freq: Four times a day (QID) | INTRAMUSCULAR | Status: DC | PRN

## 2013-05-09 MED ORDER — MAGNESIUM SULFATE 40 MG/ML IJ SOLN
2.0000 g | Freq: Once | INTRAMUSCULAR | Status: AC
  Administered 2013-05-09: 2 g via INTRAVENOUS
  Filled 2013-05-09: qty 50

## 2013-05-09 NOTE — Evaluation (Signed)
Physical Therapy Evaluation Patient Details Name: [name] MRN: [id number] DOB: Nov 07, 1955 Today's Date: 05/09/2013 Time: [id number] PT Time Calculation (min): 35 min  PT Assessment / Plan / Recommendation History of Present Illness  57 y.o. female admitted to [facility] on 05/08/13 for elective T9-S1 fusion.  She was in surgery ~12 hours and remined intubated overnight.  Extubated in am 05/09/13.    Clinical Impression  Pt is POD #1 s/p lumbar fusion T9-S1.  She has normal post-op pain and is groggy and nauseated today.  Functionally, her right leg is weak and at times tingling.  It buckles in WB.  She has a supportive husband who will be home with her 24/7 at discharge.  She may need some HHPT and if she cannot properly stand and operate a rollator, she will need a normal RW for home use (TBD).   PT to follow acutely for deficits listed below.      PT Assessment  Patient needs continued PT services    Follow Up Recommendations  Home health PT;Supervision for mobility/OOB    Does the patient have the potential to tolerate intense rehabilitation     NA  Barriers to Discharge   None      Equipment Recommendations  Other (comment) (pt may need RW instead of using her rollator- TBD)    Recommendations for Other Services   None  Frequency Min 5X/week    Precautions / Restrictions Precautions Precautions: Back Precaution Booklet Issued: Yes (comment) Precaution Comments: Handout given and reviewed.   Required Braces or Orthoses: Spinal Brace (per pt she had a brace fitted pre-op, MD did not write order) Spinal Brace: Other (comment) Spinal Brace Comments: TBD, RN to ask MD about it.    Pertinent Vitals/Pain See vitals flow sheet.       Mobility  Bed Mobility Bed Mobility: Rolling Left;Left Sidelying to Sit;Sitting - Scoot to [employer] of Bed Rolling Left: 4: Min assist;With rail Left Sidelying to Sit: 4: Min assist;With rails;HOB elevated Sitting - Scoot to [employer] of Bed: 4: Min  assist;With rail Details for Bed Mobility Assistance: min assist to support trunk during transitions.  Verbal cues for correct log roll technique.   Transfers Transfers: Sit to Stand;Stand to Sit Sit to Stand: 4: Min assist;With upper extremity assist;With armrests;From bed Stand to Sit: 4: Min assist;With upper extremity assist;With armrests;To chair/3-in-1 Stand Pivot Transfers: 4: Min assist;From elevated surface Details for Transfer Assistance: Min assist to stabilize trunk while transferring to the recliner chair from the bed on pt's left.  R knee buckling when stepping with left foot.   Ambulation/Gait Ambulation/Gait Assistance: Not tested (comment) (I would like to wait for the brace to walk. )        PT Diagnosis: Difficulty walking;Abnormality of gait;Generalized weakness;Acute pain  PT Problem List: Decreased strength;Decreased activity tolerance;Decreased balance;Decreased mobility;Decreased knowledge of use of DME;Decreased knowledge of precautions;Impaired sensation;Pain PT Treatment Interventions: DME instruction;Gait training;Stair training;Functional mobility training;Therapeutic activities;Therapeutic exercise;Balance training;Neuromuscular re-education;Patient/family education;Modalities     PT Goals(Current goals can be found in the care plan section) Acute Rehab PT Goals Patient Stated Goal: to go home PT Goal Formulation: With patient Time For Goal Achievement: 05/16/13 Potential to Achieve Goals: Good  Visit Information  Last PT Received On: 05/09/13 Assistance Needed: +1 History of Present Illness: 57 y.o. female admitted [facility] on 05/08/13 for elective T9-S1 fusion.  She was in surgery ~12 hours and remined intubated overnight.  Extubated in am 05/09/13.  Prior [place] expects to be discharged to:: Private residence Living Arrangements: Spouse/significant other Available Help at Discharge: Family;Available 24 hours/day  (husband taking off of work) Type of Home: House Home Access: Stairs to enter Technical brewer of Steps: 2 Entrance Stairs-Rails: None Home Layout: One level Home Equipment: Eclectic - 4 wheels Additional Comments: Pt's husband reports that he could put up a temporary ramp if needed.  Prior Function Level of Independence: Independent with assistive device(s) Comments: at times uses RW, but sometimes not.  Communication Communication: No difficulties    Cognition  Cognition Arousal/Alertness: Awake/alert Behavior During Therapy: WFL for tasks assessed/performed Overall Cognitive Status: Within Functional Limits for tasks assessed    Extremity/Trunk Assessment Upper Extremity Assessment Upper Extremity Assessment: Defer to OT evaluation Lower Extremity Assessment Lower Extremity Assessment: RLE deficits/detail;Generalized weakness RLE Deficits / Details: R leg weakner than left leg with buckling of knee in stance.  Per pt report her right leg was the painful one PTA, but was not this weak.   RLE Sensation: decreased light touch Cervical / Trunk Assessment Cervical / Trunk Assessment: Normal   Balance Balance Balance Assessed: Yes Static Sitting Balance Static Sitting - Balance Support: Bilateral upper extremity supported;Feet supported Static Sitting - Level of Assistance: 5: Stand by assistance Static Sitting - Comment/# of Minutes: pt sat EOB ~5 mins, dry heaving, had IV nausea meds just before moving.  Static Standing Balance Static Standing - Balance Support: Bilateral upper extremity supported Static Standing - Level of Assistance: 4: Min assist Dynamic Standing Balance Dynamic Standing - Balance Support: Bilateral upper extremity supported Dynamic Standing - Level of Assistance: 4: Min assist  End of Session PT - End of Session Equipment Utilized During Treatment: Oxygen Activity Tolerance: Patient limited by fatigue;Patient limited by pain Patient left: in  chair;with call bell/phone within reach    [place] B. Shan Valdes, PT, DPT [phone]   05/09/2013, 3:51 PM

## 2013-05-09 NOTE — Progress Notes (Addendum)
Wasted remaining fentanyl gtt into sink (119.10mL's). Witnessed by Remo Lipps, RN.   Rayburn Ma, RN

## 2013-05-09 NOTE — Op Note (Signed)
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  OPERATIVE PROCEDURE:  L1-S1 lumbar decompression with instrumented fusion, T9 through S1.  Instrumentation system used was the DePuy pedicle screw Expedium system.  Screws used with 2 crosslinks and contoured rod, omitted the left L2 pedicle screw due to medial breach and the left T9 screw due to the fact it was a hypoplastic pedicle.  HISTORY:  This is a very pleasant elderly woman who has been having severe debilitating back, buttock, and bilateral leg pain.  Multiple attempts at conservative management have failed to alleviate her symptoms and her pain persisted.  As a result, we elected to take her to the operating room for the aforementioned procedure.  All appropriate risks, benefits, and alternatives were discussed with the patient and consent was obtained.  OPERATIVE NOTE:  The patient was brought to the operating room, placed supine on the operating table.  After successful induction of general anesthesia and endotracheal intubation, TEDs, SCDs, and Foley were inserted.  Neuro monitoring needles were placed for EMG and evoked motor and sensory monitoring throughout the case.  The patient was then turned prone onto the [facility] spine frame.  All bony prominences were well padded and the back was prepped and draped in standard fashion.  Time- out was taken to confirm the patient, procedure, and all other pertinent important data.  Once this was completed, the  incision was mapped out from T8 to S1.  The midline incision was infiltrated with 0.25% Marcaine with epinephrine.  Midline incision was made completely down to S1. Sharp dissection was carried out down to the deep fascia.  The deep fascia was incised, and I began stripping the paraspinal muscles, starting at the T8 level.  Using Cobb and Bovie, I stripped the paraspinals to expose spinous process and lamina, and then just over the rib articulation.  I repeated this at T8, T9, T10, T11, and T12.  Once I had completed, I then went into the contralateral side.  Once I had the posterior aspect of the spine exposed from T8 to T12, I packed it off and then proceeded down into the lumbar.  Using a prone technique, I stripped the paraspinal muscles to expose the spinous process, lamina, and facet complex.  I removed the facet capsule and then dissected over the lateral aspect of the facet until I could palpate and visualize the transverse process.  Once this was exposed, I went to the L2 level, and L3, L4, L5, and S1 down until I had the sacral ala exposed.  I then did the same on the contralateral side.  At this point, I had the entire posterolateral aspect of the spine exposed for instrumentation.  I then identified the appropriate starting spot on  the right-hand side, this was just at the junction of sacral ala.  I then advanced the burr through the cortex.  I advanced the pedicle probe through the pedicle and into the S1 vertebral body.  I confirmed trajectory in projection with AP and lateral fluoro.  I then left the pedicle probe in place.  I then identified the transverse process of L5, used this as my landmark and advanced the pedicle probe through the pedicle of L5 and into the L5 vertebral body.  Again, using fluoro and direct visualization, I was able to place this.  I then repeated this at L4, L3, L2, and L1.  Once I had all the pedicle probes in position, I then proceeded with  my thoracic pedicle probes.  In the AP plane, I identified the borders of the pedicle of T12.  I then used a high-speed burr to decorticate the lateral aspect and used an extra pedicular approach.  I advanced the pedicle probe until as a depth of 20 mm and I was still just lateral to the medial border of the pedicle on the AP view.  I then went into the lateral view with the fluoro and then advanced the pedicle probe.  At this point, there was already beyond the posterior vertebral body, so I knew it was safe.  I advanced it into the vertebral body and left the pedicle marker in place.  I repeated this procedure at T11, T10, and T9. Once all the pedicles were cannulated, I then sequentially removed the probe, palpated with a ball-tip Feeler, and then placed the appropriate size screws.  In the thoracic region, I tapped with a 4.3 screw with a 4.3 tap, repalpated, and placed a 5.0 screw.  I had excellent bony architecture.  There was no evidence of breach.  Again, each hole was tapped, palpated, and a screw was placed.  Once I had all the thoracic screws in place, I then turned my attention to the lumbar.  Again, at L1, I used the 5.0 screw, but at L2, L3, L4, L5, I used 6.0 screws and at S1, I used a 7.0 screw.  All screws had excellent purchase, and all screw holes after they were tapped were palpated and there was no breach.  I then stimulated each screw individually and there was no evidence neuro diagnostically of breach of the pedicle screw.  At this point, I went to the left-hand side of the spine, and then repeated the entire procedure.  I elected not to place the pedicle screw at T9, as was the hypoplastic pedicle.  I placed pedicle screws at T10, T11, T12, L1, L3, L4, L5, and S1.  I used the same sized screws that I had used on the contralateral side in the lumbar spine.  The L2 screw was placed but upon tapping, I felt as though there was a slight breach and so I elected not to  use it.  Again, all the remaining screws when I stimulated were satisfactory, there was no evidence of breach.  With the screw fixation complete, I then turned my attention to the decompression.  I removed the entire spinous process of S1, L5, L4, L3, L2, and L1. With a curette, I developed a plane underneath the S1 lamina and used 2 and 3 mm Kerrison to perform a laminectomy of S1.  There was significant multifactorial spinal stenosis noted both into the foramen, lateral recess, and centrally.  There was thickening of the ligamentum flavum. All this was  debrided.  I could now visualize the posterior thecal sac. I then dissected into the lateral recess until I could visualize the S1 and S2 nerve roots.  I then performed an S1 foraminotomy.  I then continued my dissection superiorly.  There was significant thecal sac compression.  Once I had the laminectomy of L5 completed, I then went into the lateral recess and into the L5 foramen.  I then continued this until I performed an L2 laminectomy.  At this point, I then looked at the MRI and I felt as though the residual stenosis at T12-L1 was not significant enough.  I could easily palpate superiorly with the [facility].  Once the central decompression was completed, I then turned my attention to the lateral recess.  I decompressed the each of the nerve roots in the foramen, L2, L3, L4, L5, and S1.  At this point, I had an excellent posterior decompression.  There was no further nerve compression or thecal sac compression.  I was able to palpate out with a [facility] of the selective nerve neural foramen.  I then contoured 2 long rods and secured them to the pedicle screw construct and torqued down the locking nuts according to manufacturer's standards. I debrided the posterolateral aspect of the spine and then placed a bone that I had harvested from the decompression and the posterolateral gutter and augmented it with  contour allograft sheets.  I had excellent bone graft positioning.  The screws were torqued down.  I placed 2 crosslinks for added stability.  Hemostasis was obtained using bipolar electrocautery.  I then placed thrombin-soaked Gelfoam patty over the exposed thecal sac.  I then placed a large deep drain as well as antibiotic impregnated beads.  I closed in a layered fashion with interrupted #1 Vicryl sutures, running 0 Vicryl suture, interrupted 2-0 Vicryl sutures, and 3-0 Monocryl.  Steri-Strips and dry dressing were applied.  Because of the duration of the case, the decision was made to keep the patient intubated overnight.  She was transferred hemodynamically stable, intubated to the ICU.  At the end of the case, all needle and sponge counts were correct.  There were no adverse intraoperative events.     Dahlia Bailiff, MD     DDB/MEDQ  D:  05/08/2013  T:  05/09/2013  Job:  [id number]

## 2013-05-09 NOTE — Care Management Note (Signed)
    Page 1 of 1   05/09/2013     3:40:37 PM   CARE MANAGEMENT NOTE 05/09/2013  Patient:  [name], [name]   Account Number:  [id number]  Date Initiated:  05/09/2013  Documentation initiated by:  Luz Lex  Subjective/Objective Assessment:   Post op 12 hour spinal fusion - left on vent and admitted to ICU.     Action/Plan:   Anticipated DC Date:  05/14/2013   Anticipated DC Plan:  [place]  CM consult      Choice offered to / List presented to:             Status of service:  In process, will continue to follow Medicare Important Message given?   (If response is "NO", the following Medicare IM given date fields will be blank) Date Medicare IM given:   Date Additional Medicare IM given:    Discharge Disposition:    Per UR Regulation:  Reviewed for med. necessity/level of care/duration of stay  If discussed at [place] of Stay Meetings, dates discussed:    Comments:  ContactMarland Kitchen Myong, [place] [id number]   [phone]   05-19-13 3:30pm Luz Lex, [place] [phone] patient now extubated - looks good moving around in bed well.  Husband at bedside.  Has walker with wheels at home. PT to see.  CM will continue to follow for further discharge needs.

## 2013-05-09 NOTE — Progress Notes (Signed)
Repeated urinary retention  Plan Foley  Dr. Brand Males, M.D., [facility].C.P Pulmonary and Critical Care Medicine Staff Physician [place] [facility] Pager: [phone], If no answer or between  15:00h - 7:00h: call [phone]  05/09/2013 9:55 PM

## 2013-05-09 NOTE — Procedures (Signed)
Extubation Procedure Note  Patient Details:   Name: [name] DOB: [birth year] MRN: [id number]   Airway Documentation:     Evaluation  O2 sats: stable throughout Complications: No apparent complications Patient did tolerate procedure well. Bilateral Breath Sounds: Clear Suctioning: Airway Yes Patient tolerated wean. MD ordered to extubate. Positive for cuff leak. Extubated to a 3 Lpm nasal cannula. No signs of stridor or dyspnea. Patient resting comfortably. Will continue to monitor.    Myrtie Neither 05/09/2013, 8:47 AM

## 2013-05-09 NOTE — Progress Notes (Signed)
[facility] Progress Note Patient Name: [name] DOB: 09-Dec-1955 MRN: [id number]  Date of Service  05/09/2013   HPI/Events of Note     eICU Interventions  Mg replaced   Intervention Category Minor Interventions: Electrolytes abnormality - evaluation and management  BYRUM,ROBERT S. 05/09/2013, 5:14 AM

## 2013-05-09 NOTE — Consult Note (Addendum)
PULMONARY  / CRITICAL CARE MEDICINE  Name: [name] MRN: [id number] DOB: Apr 19, 1956    ADMISSION DATE:  05/08/2013 CONSULTATION DATE:  05/08/2013  REFERRING MD :  Clayborn Bigness PRIMARY SERVICE: Ortho  CHIEF COMPLAINT:  VDRF  BRIEF PATIENT DESCRIPTION: 57 year old diabetic and asthmatic patient presenting to the hospital for spinal fusion S/P 12 hour procedure for fusion in a prone position who was left intubated by anesthesia and PCCM consulted for vent management.  SIGNIFICANT EVENTS / STUDIES:  VDRF post back fusion 11/5  LINES / TUBES: ET tube 11/5>>> PIV  CULTURES: None  ANTIBIOTICS: Cefazolin 11/5>>> Nitrofurantoin 11/5>>>  SUBJECTIVE: rested onvent  VITAL SIGNS: Temp:  [97.3 F (36.3 C)-98.2 F (36.8 C)] 98.2 F (36.8 C) (11/06 0400) Pulse Rate:  [70-107] 76 (11/06 0500) Resp:  [17-24] 24 (11/06 0500) BP: (94-147)/(55-101) 96/59 mmHg (11/06 0500) SpO2:  [100 %] 100 % (11/06 0500) Arterial Line BP: (106-175)/(58-88) 106/58 mmHg (11/06 0500) FiO2 (%):  [40 %-50 %] 40 % (11/06 0500) Weight:  [72.15 kg (159 lb 1 oz)] 72.15 kg (159 lb 1 oz) (11/05 1900) HEMODYNAMICS:   VENTILATOR SETTINGS: Vent Mode:  [-] PRVC FiO2 (%):  [40 %-50 %] 40 % Set Rate:  [16 bmp-24 bmp] 20 bmp Vt Set:  [410 mL] 410 mL PEEP:  [5 cmH20] 5 cmH20 Plateau Pressure:  [17 cmH20-18 cmH20] 18 cmH20 INTAKE / OUTPUT: Intake/Output     11/05 0701 - 11/06 0700   I.V. (mL/kg) 6597 (91.4)   Blood 1300   IV Piggyback 550   Total Intake(mL/kg) 8447 (117.1)   Urine (mL/kg/hr) 2175 (1.3)   Drains 580 (0.3)   Blood 750 (0.4)   Total Output 3505   Net +4942         PHYSICAL EXAMINATION: General:  Edematous, good sychrony Neuro:  Sedated, rass 0 follows commands HEENT:  jvd wnl Cardiovascular:  RRR, Nl S1/S2, -M/R/G. Lungs:  Mild coarse, no crackles Abdomen:  Soft, NT, ND and +BS. Musculoskeletal:  -edema and -tenderness. Skin:  Intact.  LABS:  CBC Recent Labs     05/08/13  1810   05/08/13  2200  05/09/13  0405  WBC   --   9.0  8.4  HGB  10.2*  11.4*  10.5*  HCT  30.0*  31.8*  29.9*  PLT   --   110*  116*   Coag's Recent Labs     05/08/13  2029  INR  1.23   BMET Recent Labs     05/08/13  1810  05/08/13  2200  05/09/13  0405  NA  141  135  140  K  4.5  3.9  3.4*  CL   --   104  107  CO2   --   19  21  BUN   --   16  13  CREATININE   --   0.73  0.68  GLUCOSE  150*  205*  149*   Electrolytes Recent Labs     05/08/13  2200  05/09/13  0405  CALCIUM  7.7*  7.7*  MG   --   0.6*  PHOS   --   3.1   Sepsis Markers No results found for this basename: LACTICACIDVEN, PROCALCITON, O2SATVEN,  in the last 72 hours ABG Recent Labs     05/08/13  2053  05/09/13  0404  PHART  7.309*  7.510*  PCO2ART  44.5  27.0*  PO2ART  112.0*  117.0*   Liver  Enzymes No results found for this basename: AST, ALT, ALKPHOS, BILITOT, ALBUMIN,  in the last 72 hours Cardiac Enzymes Recent Labs     05/08/13  2022  05/09/13  0405  TROPONINI  <0.30  <0.30   Glucose Recent Labs     05/08/13  0712  05/08/13  2302  05/09/13  0357  GLUCAP  86  148*  136*    Imaging Dg Lumbar Spine Complete  05/08/2013   CLINICAL DATA:  39-year- female undergoing T9-S1 posterior spinal fusion. Initial encounter.  EXAM: DG C-ARM GT 120 MIN; LUMBAR SPINE - COMPLETE 4+ VIEW  TECHNIQUE: Eleven intraoperative fluoroscopic views of the spine.  CONTRAST:  None.  COMPARISON:  Chest radiographs 05/03/2013. CT Abdomen and Pelvis 07/12/2012.  FLUOROSCOPY TIME:  7 min and 26 seconds.  FINDINGS: These images demonstrate widespread transpedicular screw placement in the thoracic and lumbar spine with posterior connecting rods placed. Transpedicular screws extend to the S1 level. Enteric tube is in place. The most cephalad thoracic level demonstrates a unilateral right pedicle screw.  IMPRESSION: Widespread thoracolumbar posterior spinal fusion depicted.   Electronically Signed   By: Lars Pinks M.D.   On:  05/08/2013 19:33   Dg Chest Port 1 View  05/08/2013   CLINICAL DATA:  Respiratory failure, reset thoracic and lumbar spinal fusion  EXAM: PORTABLE CHEST - 1 VIEW  COMPARISON:  05/03/2013  FINDINGS: Endotracheal tube 13 mm above the Carina. NG tube below the hemidiaphragms extending into the stomach. Mild cardiac enlargement with low lung volumes. Streaky left base atelectasis noted. No effusion or pneumothorax. Atherosclerosis of the aorta.  IMPRESSION: Support apparatus as above.  Low volume exam with left base atelectasis.   Electronically Signed   By: Daryll Brod M.D.   On: 05/08/2013 20:53   Dg C-arm Gt 120 Min  05/08/2013   CLINICAL DATA:  45-year- female undergoing T9-S1 posterior spinal fusion. Initial encounter.  EXAM: DG C-ARM GT 120 MIN; LUMBAR SPINE - COMPLETE 4+ VIEW  TECHNIQUE: Eleven intraoperative fluoroscopic views of the spine.  CONTRAST:  None.  COMPARISON:  Chest radiographs 05/03/2013. CT Abdomen and Pelvis 07/12/2012.  FLUOROSCOPY TIME:  7 min and 26 seconds.  FINDINGS: These images demonstrate widespread transpedicular screw placement in the thoracic and lumbar spine with posterior connecting rods placed. Transpedicular screws extend to the S1 level. Enteric tube is in place. The most cephalad thoracic level demonstrates a unilateral right pedicle screw.  IMPRESSION: Widespread thoracolumbar posterior spinal fusion depicted.   Electronically Signed   By: Lars Pinks M.D.   On: 05/08/2013 19:33     CXR: ET wnl, no infilltrates, no edema  ASSESSMENT / PLAN:  PULMONARY A: VDRF post 12 hours surgery. P:   -ABG reviewed, reduce rate from 24 to 16 -after resolution alk, then SBT cpap 5 ps 5 goal 30 min, assess rsbi, ABG -pcxr in am -upright -Neurostatus supports extubation  CARDIOVASCULAR A: HTN history. Trop neg P:  - Continue home medications. - Sedation for now, do not increase anti-HTN -note home coreg use, eval for BB WD -dc ACEI / HCZT for now  RENAL A:   Hypomagnesemia, hypoK P:   - BMET in am  - Replace additional mag, k  GASTROINTESTINAL A:  No active issues. P:   - If not extubated by AM will need TF. -ppi while on vent  HEMATOLOGIC A:  No active issues, leukocytosis likely reactive. P:  - CBC in AM -scd  INFECTIOUS A:  No active issues.  P:   - Prophylaxis per surgery.  ENDOCRINE A:  DM by history.   P:   - CBGs and ISS. -dc all oral agents  NEUROLOGIC A:  Sedated. P:   - wean fent down to 50 mi -goal to dc propofol for sbt  I have personally obtained a history, examined the patient, evaluated laboratory and imaging results, formulated the assessment and plan and placed orders.  CRITICAL CARE: The patient is critically ill with multiple organ systems failure and requires high complexity decision making for assessment and support, frequent evaluation and titration of therapies, application of advanced monitoring technologies and extensive interpretation of multiple databases. Critical Care Time devoted to patient care services described in this note is 30 minutes.   Lavon Paganini. Titus Mould, MD, [place] Pgr: [place] [facility]

## 2013-05-09 NOTE — Progress Notes (Signed)
Clinical Social Worker received referral for SNF placement. CSW reviewed chart and noticed plan is for patient to return home with home health. CSW signing off but will be available as needed.  Jeanette Caprice, MSW, [place]

## 2013-05-10 ENCOUNTER — Inpatient Hospital Stay (HOSPITAL_COMMUNITY): Payer: Federal, State, Local not specified - PPO

## 2013-05-10 LAB — TYPE AND SCREEN
ABO/RH(D): A POS
Antibody Screen: NEGATIVE
Unit division: 0
Unit division: 0
Unit division: 0
Unit division: 0
Unit division: 0
Unit division: 0

## 2013-05-10 LAB — CBC WITH DIFFERENTIAL/PLATELET
Basophils Absolute: 0 10*3/uL (ref 0.0–0.1)
Basophils Relative: 0 % (ref 0–1)
Eosinophils Absolute: 0 10*3/uL (ref 0.0–0.7)
HCT: 30.3 % — ABNORMAL LOW (ref 36.0–46.0)
Hemoglobin: 10.6 g/dL — ABNORMAL LOW (ref 12.0–15.0)
Lymphs Abs: 1.2 10*3/uL (ref 0.7–4.0)
MCH: 30.8 pg (ref 26.0–34.0)
MCHC: 35 g/dL (ref 30.0–36.0)
MCV: 88.1 fL (ref 78.0–100.0)
Monocytes Relative: 7 % (ref 3–12)
Platelets: 115 10*3/uL — ABNORMAL LOW (ref 150–400)
RBC: 3.44 MIL/uL — ABNORMAL LOW (ref 3.87–5.11)

## 2013-05-10 LAB — COMPREHENSIVE METABOLIC PANEL
ALT: 17 U/L (ref 0–35)
Albumin: 2.6 g/dL — ABNORMAL LOW (ref 3.5–5.2)
Alkaline Phosphatase: 37 U/L — ABNORMAL LOW (ref 39–117)
BUN: 7 mg/dL (ref 6–23)
CO2: 25 mEq/L (ref 19–32)
Calcium: 8.1 mg/dL — ABNORMAL LOW (ref 8.4–10.5)
Creatinine, Ser: 0.59 mg/dL (ref 0.50–1.10)
GFR calc Af Amer: >90 mL/min (ref >90)
Glucose, Bld: 169 mg/dL — ABNORMAL HIGH (ref 70–99)
Potassium: 3.5 mEq/L (ref 3.5–5.1)
Sodium: 133 mEq/L — ABNORMAL LOW (ref 135–145)
Total Protein: 5.3 g/dL — ABNORMAL LOW (ref 6.0–8.3)

## 2013-05-10 LAB — GLUCOSE, CAPILLARY
Glucose-Capillary: 164 mg/dL — ABNORMAL HIGH (ref 70–99)
Glucose-Capillary: 171 mg/dL — ABNORMAL HIGH (ref 70–99)
Glucose-Capillary: 173 mg/dL — ABNORMAL HIGH (ref 70–99)
Glucose-Capillary: 203 mg/dL — ABNORMAL HIGH (ref 70–99)

## 2013-05-10 LAB — PHOSPHORUS: Phosphorus: 2.8 mg/dL (ref 2.3–4.6)

## 2013-05-10 MED ORDER — PANTOPRAZOLE SODIUM 40 MG PO TBEC
40.0000 mg | DELAYED_RELEASE_TABLET | Freq: Every day | ORAL | Status: DC
  Administered 2013-05-10 – 2013-05-13 (×4): 40 mg via ORAL
  Filled 2013-05-10 (×4): qty 1

## 2013-05-10 MED ORDER — CEFAZOLIN SODIUM 1-5 GM-% IV SOLN
1.0000 g | Freq: Three times a day (TID) | INTRAVENOUS | Status: DC
Start: 2013-05-10 — End: 2013-05-12
  Administered 2013-05-10 – 2013-05-12 (×7): 1 g via INTRAVENOUS
  Filled 2013-05-10 (×9): qty 50

## 2013-05-10 MED ORDER — MAGNESIUM SULFATE 40 MG/ML IJ SOLN
INTRAMUSCULAR | Status: AC
  Filled 2013-05-10: qty 50

## 2013-05-10 MED ORDER — RIVAROXABAN 20 MG PO TABS
20.0000 mg | ORAL_TABLET | Freq: Every day | ORAL | Status: DC
  Administered 2013-05-11 – 2013-05-12 (×2): 20 mg via ORAL
  Filled 2013-05-10 (×3): qty 1

## 2013-05-10 MED ORDER — MAGNESIUM SULFATE 40 MG/ML IJ SOLN
2.0000 g | Freq: Once | INTRAMUSCULAR | Status: AC
  Administered 2013-05-10: 2 g via INTRAVENOUS

## 2013-05-10 MED ORDER — RIVAROXABAN 20 MG PO TABS
20.0000 mg | ORAL_TABLET | Freq: Every day | ORAL | Status: DC
  Administered 2013-05-10: 20 mg via ORAL
  Filled 2013-05-10: qty 1

## 2013-05-10 MED ORDER — POTASSIUM CHLORIDE CRYS ER 20 MEQ PO TBCR
20.0000 meq | EXTENDED_RELEASE_TABLET | ORAL | Status: AC
  Administered 2013-05-10 (×2): 20 meq via ORAL
  Filled 2013-05-10 (×2): qty 1

## 2013-05-10 NOTE — Progress Notes (Signed)
PHARMACIST - PHYSICIAN COMMUNICATION DR:   Rolena Infante CONCERNING: Protonix IV to Oral Route Change Policy  RECOMMENDATION: This patient is receiving Protonix by the intravenous route.  Based on criteria approved by the Pharmacy and Therapeutics Committee, this drug is being converted to the equivalent oral dose form(s).  DESCRIPTION: These criteria include:  The patient is eating (either orally or via tube) and/or has been taking other orally administered medications for a least 24 hours  There is no active GI bleed or impaired GI absorption noted.   If you have questions about this conversion, please contact the Pharmacy Department  []   [phone] )  [name] [x]   [phone] )  [name]  []   [phone] )  [facility] []   [phone] )  [place], PharmD, BCPS Clinical Pharmacist Pager: [phone] 05/10/2013 1:57 PM

## 2013-05-10 NOTE — Progress Notes (Signed)
[facility] ADULT ICU REPLACEMENT PROTOCOL FOR AM LAB REPLACEMENT ONLY  The patient does apply for the [facility] Adult ICU Electrolyte Replacment Protocol based on the criteria listed below:   1. Is GFR >/= 40 ml/min? yes  Patient's GFR today is >90 2. Is urine output >/= 0.5 ml/kg/hr for the last 6 hours? yes Patient's UOP is 2.7 ml/kg/hr 3. Is BUN < 60 mg/dL? yes  Patient's BUN today is 7 4. Abnormal electrolyte(s): K 3.5, Mag 1.7 5. Ordered repletion with: per protocol 6. If a panic level lab has been reported, has the CCM MD in charge been notified? no.   Physician:    Ronda Fairly A 05/10/2013 5:47 AM

## 2013-05-10 NOTE — Progress Notes (Signed)
    Subjective: Procedure(s) (LRB): T9-S1 INSTRUMENTED FUSION T12 -S1 DECOMPRESSION (N/A) 2 Days Post-Op  Patient reports pain as 4 on 0-10 scale.  Reports decreased leg pain reports incisional back pain   N/A void Negative bowel movement Positive flatus Negative chest pain or shortness of breath  Objective: Vital signs in last 24 hours: Temp:  [98 F (36.7 C)-98.9 F (37.2 C)] 98.1 F (36.7 C) (11/07 0000) Pulse Rate:  [97-117] 112 (11/07 0700) Resp:  [11-22] 19 (11/07 0700) BP: (112-157)/(63-125) 137/84 mmHg (11/07 0700) SpO2:  [95 %-100 %] 96 % (11/07 0700) Arterial Line BP: (104-157)/(48-76) 141/64 mmHg (11/06 1200) FiO2 (%):  [40 %] 40 % (11/06 0735)  Intake/Output from previous day: 11/06 0701 - 11/07 0700 In: 2129.4 [P.O.:190; I.V.:1679.4; NG/GT:60; IV Piggyback:200] Out: 2570 [Urine:1835; Emesis/NG output:60; Drains:675]  Labs:  Recent Labs  05/09/13 0405 05/10/13 0400  WBC 8.4 13.4*  RBC 3.43* 3.44*  HCT 29.9* 30.3*  PLT 116* 115*    Recent Labs  05/09/13 0405 05/10/13 0400  NA 140 133*  K 3.4* 3.5  CL 107 99  CO2 21 25  BUN 13 7  CREATININE 0.68 0.59  GLUCOSE 149* 169*  CALCIUM 7.7* 8.1*    Recent Labs  05/08/13 2029  INR 1.23    Physical Exam: Neurologically intact ABD soft Neurovascular intact Incision: dressing C/D/I and no drainage Compartment soft Rectal: nl sensation/  Tone intact  Assessment/Plan: Patient stable  xrays pending Continue mobilization with physical therapy Continue care  Advance diet Up with therapy Will restart xarelto as she was on this pre-op Maintain drain today to make sure no excessive surgical bleeding occurs with restart of xarelto Monitor clinical exam D/c foley and pca Oral pain control Mobilization with PT Will need HHS - Lorelle Gibbs, MD [place] [phone]

## 2013-05-10 NOTE — Consult Note (Signed)
PULMONARY  / CRITICAL CARE MEDICINE  Name: TARAJI AHEDO MRN: [id number] DOB: August 23, 1955    ADMISSION DATE:  05/08/2013 CONSULTATION DATE:  05/08/2013  REFERRING MD :  Rolena Infante  CHIEF COMPLAINT:  VDRF  BRIEF PATIENT DESCRIPTION:  57 yo female s/p spinal fusion and remained on vent post-op.  PCCM consulted for vent management.  SIGNIFICANT EVENTS: 11/5 spinal fusion  STUDIES:   LINES / TUBES: ET tube 11/5>>>11/6 PIV  CULTURES: None  ANTIBIOTICS: Cefazolin 11/5>>> Nitrofurantoin 11/5>>>  SUBJECTIVE: Breathing okay.  Denies chest pain.   VITAL SIGNS: Temp:  [97.9 F (36.6 C)-98.9 F (37.2 C)] 97.9 F (36.6 C) (11/07 0827) Pulse Rate:  [97-116] 99 (11/07 1100) Resp:  [12-22] 18 (11/07 1100) BP: (112-155)/(63-125) 153/83 mmHg (11/07 1100) SpO2:  [95 %-100 %] 99 % (11/07 1100) Arterial Line BP: (141)/(64) 141/64 mmHg (11/06 1200) 2 literx  INTAKE / OUTPUT: Intake/Output     11/06 0701 - 11/07 0700 11/07 0701 - 11/08 0700   P.O. 190 150   I.V. (mL/kg) 1679.4 (23.3) 340 (4.7)   Blood     NG/GT 60    IV Piggyback 200 50   Total Intake(mL/kg) 2129.4 (29.5) 540 (7.5)   Urine (mL/kg/hr) 1835 (1.1) 475 (1.5)   Emesis/NG output 60 (0)    Drains 675 (0.4) 110 (0.4)   Blood     Total Output 2570 585   Net -440.6 -45          PHYSICAL EXAMINATION: General: no distress Neuro: normal strenght HEENT: no sinus tenderness Cardiovascular: regular Lungs: no wheeze Abdomen: soft, non tender Musculoskeletal: no edema Skin: no rashes  LABS:  CBC Recent Labs     05/08/13  2200  05/09/13  0405  05/10/13  0400  WBC  9.0  8.4  13.4*  HGB  11.4*  10.5*  10.6*  HCT  31.8*  29.9*  30.3*  PLT  110*  116*  115*   Coag's Recent Labs     05/08/13  2029  INR  1.23   BMET Recent Labs     05/08/13  2200  05/09/13  0405  05/10/13  0400  NA  135  140  133*  K  3.9  3.4*  3.5  CL  104  107  99  CO2  19  21  25   BUN  16  13  7   CREATININE  0.73  0.68  0.59   GLUCOSE  205*  149*  169*   Electrolytes Recent Labs     05/08/13  2200  05/09/13  0405  05/10/13  0400  CALCIUM  7.7*  7.7*  8.1*  MG   --   0.6*  1.7  PHOS   --   3.1  2.8   ABG Recent Labs     05/08/13  2053  05/09/13  0404  05/09/13  0820  PHART  7.309*  7.510*  7.326*  PCO2ART  44.5  27.0*  45.1*  PO2ART  112.0*  117.0*  124.0*   Liver Enzymes Recent Labs     05/10/13  0400  AST  37  ALT  17  ALKPHOS  37*  BILITOT  0.4  ALBUMIN  2.6*   Cardiac Enzymes Recent Labs     05/08/13  2022  05/09/13  0405  05/09/13  1222  TROPONINI  <0.30  <0.30  <0.30   Glucose Recent Labs     05/09/13  1112  05/09/13  1549  05/09/13  2028  05/10/13  0026  05/10/13  0414  05/10/13  0832  GLUCAP  221*  209*  158*  171*  159*  164*    Imaging Dg Lumbar Spine Complete  05/08/2013   CLINICAL DATA:  79-year- female undergoing T9-S1 posterior spinal fusion. Initial encounter.  EXAM: DG C-ARM GT 120 MIN; LUMBAR SPINE - COMPLETE 4+ VIEW  TECHNIQUE: Eleven intraoperative fluoroscopic views of the spine.  CONTRAST:  None.  COMPARISON:  Chest radiographs 05/03/2013. CT Abdomen and Pelvis 07/12/2012.  FLUOROSCOPY TIME:  7 min and 26 seconds.  FINDINGS: These images demonstrate widespread transpedicular screw placement in the thoracic and lumbar spine with posterior connecting rods placed. Transpedicular screws extend to the S1 level. Enteric tube is in place. The most cephalad thoracic level demonstrates a unilateral right pedicle screw.  IMPRESSION: Widespread thoracolumbar posterior spinal fusion depicted.   Electronically Signed   By: Lars Pinks M.D.   On: 05/08/2013 19:33   Dg Chest Port 1 View  05/10/2013   CLINICAL DATA:  Assess edema, history breast cancer, diabetes mellitus  EXAM: PORTABLE CHEST - 1 VIEW  COMPARISON:  Portable exam [id number] hr compared to 05/09/2013  FINDINGS: Enlargement of cardiac silhouette.  Mediastinal contours and pulmonary vascularity normal.  Linear  subsegmental atelectasis right base.  Persistent left basilar opacity question atelectasis versus consolidation.  Upper lungs clear.  Atherosclerotic calcification aorta.  No gross pleural effusion or pneumothorax.  Bones unremarkable.  IMPRESSION: Minimal right basilar atelectasis with persistent mild atelectasis versus consolidation at left base.  Enlargement of cardiac silhouette.   Electronically Signed   By: Lavonia Dana M.D.   On: 05/10/2013 07:59   Dg Chest Port 1 View  05/09/2013   CLINICAL DATA:  Status post thoracic and lumbar fusion.  EXAM: PORTABLE CHEST - 1 VIEW  COMPARISON:  May 08, 2013  FINDINGS: The endotracheal tube tip lies at the level of the clavicular heads. The esophagogastric tube tip projects off the film but the proximal port is below the GE junction. The left hemidiaphragm remains partially obscured. The cardiopericardial silhouette is normal in size. The pulmonary interstitial markings are slightly more conspicuous today than on the earlier study but this may be due to technical factors. There is no discrete alveolar infiltrate but retrocardiac atelectasis on the left is suspected.  IMPRESSION: There has not been significant interval change in the appearance of the chest since yesterday's study.   Electronically Signed   By: David  Martinique   On: 05/09/2013 08:00   Dg Chest Port 1 View  05/08/2013   CLINICAL DATA:  Respiratory failure, reset thoracic and lumbar spinal fusion  EXAM: PORTABLE CHEST - 1 VIEW  COMPARISON:  05/03/2013  FINDINGS: Endotracheal tube 13 mm above the Carina. NG tube below the hemidiaphragms extending into the stomach. Mild cardiac enlargement with low lung volumes. Streaky left base atelectasis noted. No effusion or pneumothorax. Atherosclerosis of the aorta.  IMPRESSION: Support apparatus as above.  Low volume exam with left base atelectasis.   Electronically Signed   By: Daryll Brod M.D.   On: 05/08/2013 20:53   Dg C-arm Gt 120 Min  05/08/2013    CLINICAL DATA:  19-year- female undergoing T9-S1 posterior spinal fusion. Initial encounter.  EXAM: DG C-ARM GT 120 MIN; LUMBAR SPINE - COMPLETE 4+ VIEW  TECHNIQUE: Eleven intraoperative fluoroscopic views of the spine.  CONTRAST:  None.  COMPARISON:  Chest radiographs 05/03/2013. CT Abdomen and Pelvis 07/12/2012.  FLUOROSCOPY TIME:  7 min and 26 seconds.  FINDINGS: These images demonstrate widespread transpedicular screw placement in the thoracic and lumbar spine with posterior connecting rods placed. Transpedicular screws extend to the S1 level. Enteric tube is in place. The most cephalad thoracic level demonstrates a unilateral right pedicle screw.  IMPRESSION: Widespread thoracolumbar posterior spinal fusion depicted.   Electronically Signed   By: Lars Pinks M.D.   On: 05/08/2013 19:33    ASSESSMENT / PLAN:  A: VDRF after surgery >> extubated 11/06 w/o difficulty. Hx of Asthma. P:   -bronchial hygiene -prn albuterol  A:  Hx of HTN, arrhythmia. P:  -holding coreg, lisinopril, HCTZ, crestor -continue tamabocor  A: Hx of DM. P: -SSI -holding glucotrol, glucophage, januvia  A: S/p spinal fusion. P: -per ortho  Agree with plan to transfer to floor bed.  PCCM will sign off.  If additional medical assistance needed, then recommend consulting [facility].  Chesley Mires, MD [facility] Pulmonary/Critical Care 05/10/2013, 11:26 AM Pager:  [phone] After 3pm call: [phone]

## 2013-05-10 NOTE — Progress Notes (Addendum)
Approximately 19 cc Fentanyl wasted from PCA in sink and witnessed by Lily Kocher RN.  Witnessed by Lenox Ahr

## 2013-05-10 NOTE — Evaluation (Signed)
Occupational Therapy Evaluation Patient Details Name: [name] MRN: [id number] DOB: [birth year] Today's Date: 05/10/2013 Time: 1530-1601 OT Time Calculation (min): 31 min  OT Assessment / Plan / Recommendation History of present illness 57 y.o. female admitted to [facility] on 05/08/13 for elective T9-S1 fusion.  She was in surgery ~12 hours and remined intubated overnight.  Extubated in am 05/09/13.     Clinical Impression   Pt admitted for above.  SHe demonstrates the below listed deficits and will benefit from skilled acute OT to address the below listed deficits and allow her to return to a supervision level with BADLs to return home with spouse.  Pt did very well on eval and should progress nicely     OT Assessment  Patient needs continued OT Services    Follow Up Recommendations  Supervision/Assistance - 24 hour;No OT follow up    Barriers to Discharge      Equipment Recommendations  None recommended by OT    Recommendations for Other Services    Frequency  Min 2X/week    Precautions / Restrictions Precautions Precautions: Back Precaution Booklet Issued: Yes (comment) Precaution Comments: Pt able to recall 3/3 precautions  Required Braces or Orthoses: Spinal Brace Spinal Brace: Lumbar corset;Applied in sitting position   Pertinent Vitals/Pain     ADL  Eating/Feeding: Independent Where Assessed - Eating/Feeding: Chair Grooming: Wash/dry hands;Wash/dry face;Teeth care;Brushing hair;Set up Where Assessed - Grooming: Unsupported sitting Upper Body Bathing: Supervision/safety Where Assessed - Upper Body Bathing: Unsupported sitting Lower Body Bathing: Minimal assistance Where Assessed - Lower Body Bathing: Supported sit to stand Upper Body Dressing: Supervision/safety Where Assessed - Upper Body Dressing: Unsupported sitting Lower Body Dressing: Minimal assistance Where Assessed - Lower Body Dressing: Supported sit to stand Toilet Transfer: Minimal assistance Toilet  Transfer Method: Sit to Loss adjuster, chartered: Regular height toilet;Grab bars Toileting - Clothing Manipulation and Hygiene: Minimal assistance Where Assessed - Best boy and Hygiene: Standing Equipment Used: Rolling walker Transfers/Ambulation Related to ADLs: min a ADL Comments: Pt requires min A to don brace.  She is able to don/doff socks by crossing ankles over knees.  Pt demonstrates good awareness of back precautions, but requires cues for hand placement and walker safety.  Husband very supportive     OT Diagnosis: Generalized weakness;Acute pain  OT Problem List: Decreased strength;Decreased activity tolerance;Decreased safety awareness;Decreased knowledge of use of DME or AE;Decreased knowledge of precautions;Pain OT Treatment Interventions: Self-care/ADL training;DME and/or AE instruction;Therapeutic activities;Patient/family education   OT Goals(Current goals can be found in the care plan section) Acute Rehab OT Goals Patient Stated Goal: to go home OT Goal Formulation: With patient Time For Goal Achievement: 05/17/13 Potential to Achieve Goals: Good ADL Goals Pt Will Perform Grooming: with supervision;standing Pt Will Perform Lower Body Bathing: with supervision;sit to/from stand Pt Will Perform Lower Body Dressing: with supervision;sit to/from stand Pt Will Transfer to Toilet: with supervision;regular height toilet;ambulating;grab bars Pt Will Perform Tub/Shower Transfer: Shower transfer;with supervision;rolling walker;ambulating;shower seat Additional ADL Goal #1: Pt will be independent with back precautions during ADLs and functional mobility   Visit Information  Last OT Received On: 05/10/13 Assistance Needed: +1 History of Present Illness: 57 y.o. female admitted to [facility] on 05/08/13 for elective T9-S1 fusion.  She was in surgery ~12 hours and remined intubated overnight.  Extubated in am 05/09/13.         Prior Functioning     Home  Living Family/patient expects to be discharged to:: Private residence Living Arrangements:  Spouse/significant other Available Help at Discharge: Family;Available 24 hours/day (husband taking off of work) Type of Home: House Home Access: Stairs to enter [employer] of Steps: 2 Entrance Stairs-Rails: None Home Layout: One level Home Equipment: Environmental consultant - 4 wheels;Shower seat Additional Comments: Pt's husband reports that he could put up a temporary ramp if needed.  Prior Function Level of Independence: Independent with assistive device(s) Comments: at times uses RW, but sometimes not.  Communication Communication: No difficulties Dominant Hand: Right         Vision/Perception     Cognition  Cognition Arousal/Alertness: Awake/alert Behavior During Therapy: WFL for tasks assessed/performed Overall Cognitive Status: Within Functional Limits for tasks assessed    Extremity/Trunk Assessment       Mobility Bed Mobility Bed Mobility: Left Sidelying to Sit;Sitting - Scoot to Edge of Bed;Sit to Sidelying Left Left Sidelying to Sit: 4: Min guard;With rails Sitting - Scoot to Edge of Bed: 4: Min guard Sit to Sidelying Left: 4: Min assist;With rail Details for Bed Mobility Assistance: assist for LEs Transfers Transfers: Sit to Stand;Stand to Sit Sit to Stand: 4: Min assist;With upper extremity assist;From bed Stand to Sit: 4: Min assist;With upper extremity assist;To bed Details for Transfer Assistance: Pt requires cues for hand placement.  Assist to stabilize     Exercise     Balance     End of Session OT - End of Session Equipment Utilized During Treatment: Rolling walker;Back brace Activity Tolerance: Patient limited by pain Patient left: in bed;with call bell/phone within reach;with family/visitor present;with nursing/sitter in room Nurse Communication: Patient requests pain meds  [place], Ellard Artis M 05/10/2013, 7:17 PM

## 2013-05-10 NOTE — Progress Notes (Signed)
Physical Therapy Treatment Patient Details Name: [name] MRN: [id number] DOB: 1955/08/16 Today's Date: 05/10/2013 Time: [id number] PT Time Calculation (min): 29 min  PT Assessment / Plan / Recommendation  History of Present Illness 57 y.o. female admitted to [facility] on 05/08/13 for elective T9-S1 fusion.  She was in surgery ~12 hours and remined intubated overnight.  Extubated in am 05/09/13.     PT Comments   Pt admitted with above. Pt currently with functional limitations due to continued deficits in endurance and balance as well as pain.  Pt will benefit from skilled PT to increase their independence and safety with mobility to allow discharge to the venue listed below.   Follow Up Recommendations  Home health PT;Supervision for mobility/OOB                 Equipment Recommendations  Other (comment) (pt may need RW instead of using her rollator- TBD)        Frequency Min 5X/week   Progress towards PT Goals Progress towards PT goals: Progressing toward goals  Plan Current plan remains appropriate    Precautions / Restrictions Precautions Precautions: Back Precaution Booklet Issued: Yes (comment) Precaution Comments: Handout given and reviewed.   Required Braces or Orthoses: Spinal Brace (brace in place on arrival) Spinal Brace: Lumbar corset;Applied in sitting position Restrictions Weight Bearing Restrictions: No   Pertinent Vitals/Pain VSS, Some back pain    Mobility  Bed Mobility Bed Mobility: Not assessed Transfers Transfers: Sit to Stand;Stand to Sit Sit to Stand: 4: Min assist;With upper extremity assist;With armrests;From chair/3-in-1 Stand to Sit: 4: Min assist;With upper extremity assist;With armrests;To chair/3-in-1 Details for Transfer Assistance: Min assist to stabilize trunk and once stabilized, pt able to begin ambulation. Ambulation/Gait Ambulation/Gait Assistance: 4: Min assist Ambulation Distance (Feet): 45 Feet Assistive device: Rolling  walker Ambulation/Gait Assistance Details: Pt ambulated with RW with no knee buckling either knee.  C/o left LE pain and spasms which pt expressed surprise about because she states it was usually right LE.  Pt needed cues for walker technique and sequencing with RW.  NT brought chair to pt at end of walk as pt fatigued.  Pt then stood again once back to room so NT could clean her bottom.  NT gave pt bath while PT set pt back up.   Gait Pattern: Step-through pattern;Shuffle;Trunk flexed;Wide base of support Gait velocity: decreased Stairs: No Wheelchair Mobility Wheelchair Mobility: No     PT Goals (current goals can now be found in the care plan section)    Visit Information  Last PT Received On: 05/10/13 Assistance Needed: +2 (for chair follow) History of Present Illness: 57 y.o. female admitted to [facility] on 05/08/13 for elective T9-S1 fusion.  She was in surgery ~12 hours and remined intubated overnight.  Extubated in am 05/09/13.      Subjective Data  Subjective: "I hurt but I do ok."   Cognition  Cognition Arousal/Alertness: Awake/alert Behavior During Therapy: WFL for tasks assessed/performed Overall Cognitive Status: Within Functional Limits for tasks assessed    Balance  Static Sitting Balance Static Sitting - Level of Assistance: Not tested (comment) Static Standing Balance Static Standing - Balance Support: Bilateral upper extremity supported;During functional activity Static Standing - Level of Assistance: 4: Min assist Static Standing - Comment/# of Minutes: 2 min with RW Dynamic Standing Balance Dynamic Standing - Balance Support: Bilateral upper extremity supported;During functional activity Dynamic Standing - Level of Assistance: 4: Min assist Dynamic Standing - Balance Activities: Lateral lean/weight  shifting;Forward lean/weight shifting;Reaching across midline Dynamic Standing - Comments: 2 min with RW  End of Session PT - End of Session Equipment Utilized During  Treatment: Oxygen;Gait belt Activity Tolerance: Patient limited by fatigue;Patient limited by pain Patient left: in chair;with call bell/phone within reach Nurse Communication: Mobility status       INGOLD,Vandana Haman 05/10/2013, 9:45 AM  [facility] [phone] (pager)

## 2013-05-11 DIAGNOSIS — E119 Type 2 diabetes mellitus without complications: Secondary | ICD-10-CM

## 2013-05-11 HISTORY — DX: Type 2 diabetes mellitus without complications: E11.9

## 2013-05-11 LAB — GLUCOSE, CAPILLARY
Glucose-Capillary: 140 mg/dL — ABNORMAL HIGH (ref 70–99)
Glucose-Capillary: 142 mg/dL — ABNORMAL HIGH (ref 70–99)
Glucose-Capillary: 155 mg/dL — ABNORMAL HIGH (ref 70–99)
Glucose-Capillary: 166 mg/dL — ABNORMAL HIGH (ref 70–99)
Glucose-Capillary: 168 mg/dL — ABNORMAL HIGH (ref 70–99)

## 2013-05-11 LAB — BASIC METABOLIC PANEL
CO2: 30 mEq/L (ref 19–32)
Calcium: 8.7 mg/dL (ref 8.4–10.5)
Chloride: 98 mEq/L (ref 96–112)
GFR calc Af Amer: >90 mL/min (ref >90)
Glucose, Bld: 140 mg/dL — ABNORMAL HIGH (ref 70–99)
Sodium: 135 mEq/L (ref 135–145)

## 2013-05-11 LAB — MAGNESIUM: Magnesium: 1.7 mg/dL (ref 1.5–2.5)

## 2013-05-11 MED ORDER — HYDROCHLOROTHIAZIDE 12.5 MG PO CAPS
12.5000 mg | ORAL_CAPSULE | Freq: Every day | ORAL | Status: DC
  Administered 2013-05-11 – 2013-05-13 (×3): 12.5 mg via ORAL
  Filled 2013-05-11 (×4): qty 1

## 2013-05-11 MED ORDER — INSULIN ASPART 100 UNIT/ML ~~LOC~~ SOLN
0.0000 [IU] | Freq: Three times a day (TID) | SUBCUTANEOUS | Status: DC
  Administered 2013-05-12 – 2013-05-13 (×2): 3 [IU] via SUBCUTANEOUS

## 2013-05-11 MED ORDER — GLIPIZIDE ER 10 MG PO TB24
10.0000 mg | ORAL_TABLET | Freq: Every day | ORAL | Status: DC
  Administered 2013-05-12 – 2013-05-13 (×2): 10 mg via ORAL
  Filled 2013-05-11 (×3): qty 1

## 2013-05-11 MED ORDER — LISINOPRIL 10 MG PO TABS
10.0000 mg | ORAL_TABLET | Freq: Every day | ORAL | Status: DC
  Administered 2013-05-11 – 2013-05-13 (×3): 10 mg via ORAL
  Filled 2013-05-11 (×4): qty 1

## 2013-05-11 MED ORDER — CARVEDILOL 6.25 MG PO TABS
6.2500 mg | ORAL_TABLET | Freq: Two times a day (BID) | ORAL | Status: DC
  Administered 2013-05-11 – 2013-05-13 (×4): 6.25 mg via ORAL
  Filled 2013-05-11 (×6): qty 1

## 2013-05-11 MED ORDER — LISINOPRIL-HYDROCHLOROTHIAZIDE 10-12.5 MG PO TABS: 1.0000 | ORAL_TABLET | Freq: Every day | ORAL | Status: DC

## 2013-05-11 MED ORDER — LINAGLIPTIN 5 MG PO TABS
5.0000 mg | ORAL_TABLET | Freq: Every day | ORAL | Status: DC
  Administered 2013-05-11 – 2013-05-13 (×3): 5 mg via ORAL
  Filled 2013-05-11 (×4): qty 1

## 2013-05-11 MED ORDER — METFORMIN HCL 500 MG PO TABS
1000.0000 mg | ORAL_TABLET | Freq: Two times a day (BID) | ORAL | Status: DC
  Administered 2013-05-11 – 2013-05-13 (×4): 1000 mg via ORAL
  Filled 2013-05-11 (×6): qty 2

## 2013-05-11 NOTE — Progress Notes (Signed)
Physical Therapy Treatment Patient Details Name: [name] MRN: [id number] DOB: [birth year] Today's Date: 05/11/2013 Time: [id number] PT Time Calculation (min): 15 min  PT Assessment / Plan / Recommendation  History of Present Illness 57 y.o. female admitted to [facility] on 05/08/13 for elective T9-S1 fusion.  She was in surgery ~12 hours and remined intubated overnight.  Extubated in am 05/09/13.     PT Comments   Pt making steady progress with mobility but cont's to c/o LLE pain.  Needs to practice steps before d/c home.    Follow Up Recommendations  Home health PT;Supervision for mobility/OOB     Does the patient have the potential to tolerate intense rehabilitation     Barriers to Discharge        Equipment Recommendations  Other (comment) (pt may need RW instead of her rollator- TBD)    Recommendations for Other Services    Frequency Min 5X/week   Progress towards PT Goals Progress towards PT goals: Progressing toward goals  Plan Current plan remains appropriate    Precautions / Restrictions Precautions Precautions: Back Precaution Comments: Pt able to recall 3/3 precautions  Required Braces or Orthoses: Spinal Brace Spinal Brace: Lumbar corset;Applied in sitting position   Pertinent Vitals/Pain C/o Lt LE pain.  Did not rate but states it is worse today than it has been.     Mobility  Bed Mobility Bed Mobility: Left Sidelying to Sit Left Sidelying to Sit: 5: Supervision;HOB flat Details for Bed Mobility Assistance: Pt reaching for assist from therapist before attempting by herself.  Encouraged to perform as independently as possible.  Slow but able to complete without physical (A).  Transfers Transfers: Sit to Stand;Stand to Sit Sit to Stand: 4: Min guard;With upper extremity assist;From bed Stand to Sit: 4: Min guard;With upper extremity assist;With armrests;To chair/3-in-1 Details for Transfer Assistance: cues to reinforce safest hand  placement Ambulation/Gait Ambulation/Gait Assistance: 4: Min guard Ambulation Distance (Feet): 80 Feet Assistive device: Rolling walker Ambulation/Gait Assistance Details: cues for sequencing & safe RW advancement.  No knee buckling noted but does c/o Lt LE pain.  Able to complete distance without rest break.   Gait Pattern: Step-through pattern;Decreased weight shift to left;Decreased step length - right;Antalgic Gait velocity: decreased Stairs: No Wheelchair Mobility Wheelchair Mobility: No      PT Goals (current goals can now be found in the care plan section) Acute Rehab PT Goals PT Goal Formulation: With patient Time For Goal Achievement: 05/16/13 Potential to Achieve Goals: Good  Visit Information  Last PT Received On: 05/11/13 Assistance Needed: +1 History of Present Illness: 57 y.o. female admitted to [facility] on 05/08/13 for elective T9-S1 fusion.  She was in surgery ~12 hours and remined intubated overnight.  Extubated in am 05/09/13.      Subjective Data      Cognition  Cognition Arousal/Alertness: Awake/alert Behavior During Therapy: WFL for tasks assessed/performed Overall Cognitive Status: Within Functional Limits for tasks assessed    Balance     End of Session PT - End of Session Equipment Utilized During Treatment: Gait belt;Back brace Activity Tolerance: Patient tolerated treatment well Patient left: in chair;with call bell/phone within reach;with family/visitor present Nurse Communication: Mobility status   GP     Sena Hitch 05/11/2013, 11:06 AM  Sarajane Marek, PTA [phone] 05/11/2013

## 2013-05-11 NOTE — Progress Notes (Signed)
Occupational Therapy Treatment and Discharge Patient Details Name: [name] MRN: [id number] DOB: 06/19/1956 Today's Date: 05/11/2013 Time: 1419-1430 OT Time Calculation (min): 11 min  OT Assessment / Plan / Recommendation  History of present illness 57 y.o. female admitted to [facility] on 05/08/13 for elective T9-S1 fusion.  She was in surgery ~12 hours and remined intubated overnight.  Extubated in am 05/09/13.     OT comments  This 57 yo female making progress. All questions answered and pt/husband report that they do not have any further ADL questions, acute OT will sign off.  Follow Up Recommendations  No OT follow up;Supervision - Intermittent       Equipment Recommendations  None recommended by OT          Progress towards OT Goals Progress towards OT goals: Progressing toward goals  Plan Discharge plan remains appropriate    Precautions / Restrictions Precautions Precautions: Back Precaution Booklet Issued: Yes (comment) (husband now aware of it) Precaution Comments: Pt able to recall 3/3 precautions  (primary and extended family now aware of them as well) Required Braces or Orthoses: Spinal Brace Spinal Brace: Lumbar corset;Applied in sitting position Restrictions Weight Bearing Restrictions: No       ADL  Equipment Used: Back brace ADL Comments: Upon entering room pt had just gotten back from the bathroom with nursing A and was sitting on EOB with brace on. Pt reports that she is able to get her socks on by crossing one leg over the other. Pt reports no issues with getting up/down from the toliet in the bathroom (elevated) and her husband reports that  they will borrow a 3n1  and thus they do not foresee any issues with tolilet transfers. Talked with them about their shower at home and  pt reports she can borrow a shower chair. Made them aware that she may either have to back into the shower or go sideways depending on how the walker will fit through the door to shower  stall.  Pt is aware that she will have to use a cup to spit in when brushing her teeth since she  cannot bend forward over the sink. No further issues or questions from pt or husband.     OT Goals(current goals can now be found in the care plan section)    Visit Information  Last OT Received On: 05/11/13 Assistance Needed: +1 History of Present Illness: 57 y.o. female admitted to [facility] on 05/08/13 for elective T9-S1 fusion.  She was in surgery ~12 hours and remined intubated overnight.  Extubated in am 05/09/13.            Cognition  Cognition Arousal/Alertness: Awake/alert Behavior During Therapy: WFL for tasks assessed/performed Overall Cognitive Status: Within Functional Limits for tasks assessed    Mobility  Bed Mobility Bed Mobility: Sit to Sidelying Left Sit to Sidelying Left: 4: Min guard;HOB flat (no rail.) Details for Bed Mobility Assistance: Pillow placed between patients knees since she was laying on her side--pointed out to husband that this was on the handout the patient had.          End of Session OT - End of Session Equipment Utilized During Treatment: Back brace Patient left: in bed;with call bell/phone within reach;with family/visitor present       Almon Register [id number] 05/11/2013, 2:45 PM

## 2013-05-11 NOTE — Progress Notes (Signed)
Subjective: 3 Days Post-Op Procedure(s) (LRB): T9-S1 INSTRUMENTED FUSION T12 -S1 DECOMPRESSION (N/A) Patient reports pain as mild.  Seen in AM rounds with Dr. Tonita Cong. Pain well controlled. Voiding without difficulty. No flatus yet. Drain was emptied once already this AM. Reported output so far today 60cc.  Objective: Vital signs in last 24 hours: Temp:  [98.7 F (37.1 C)-99 F (37.2 C)] 98.7 F (37.1 C) (11/08 0659) Pulse Rate:  [99-117] 105 (11/08 0659) Resp:  [14-18] 14 (11/08 0659) BP: (126-153)/(78-89) 132/78 mmHg (11/08 0659) SpO2:  [91 %-100 %] 91 % (11/08 0659)  Intake/Output from previous day: 11/07 0701 - 11/08 0700 In: [id number] [P.O.:150; I.V.:1400; IV Piggyback:100] Out: 820 [Urine:475; Drains:345] Intake/Output this shift: Total I/O In: 240 [P.O.:240] Out: 420 [Urine:400; Drains:20]   Recent Labs  05/08/13 1657 05/08/13 1810 05/08/13 2200 05/09/13 0405 05/10/13 0400  HGB 8.5* 10.2* 11.4* 10.5* 10.6*    Recent Labs  05/09/13 0405 05/10/13 0400  WBC 8.4 13.4*  RBC 3.43* 3.44*  HCT 29.9* 30.3*  PLT 116* 115*    Recent Labs  05/10/13 0400 05/11/13 0415  NA 133* 135  K 3.5 4.0  CL 99 98  CO2 25 30  BUN 7 8  CREATININE 0.59 0.64  GLUCOSE 169* 140*  CALCIUM 8.1* 8.7    Recent Labs  05/08/13 2029  INR 1.23    Neurologically intact ABD soft Neurovascular intact Sensation intact distally Intact pulses distally Dorsiflexion/Plantar flexion intact Incision: dressing C/D/I and no drainage No cellulitis present Compartment soft no calf pain or sign of DVT  Assessment/Plan: 3 Days Post-Op Procedure(s) (LRB): T9-S1 INSTRUMENTED FUSION T12 -S1 DECOMPRESSION (N/A) Advance diet Up with therapy Plan for discharge tomorrow Encouraged incentive spirometry Will leave drain until tomorrow with >60cc output in the last 8 hours Plan to D/C drain tomorrow as long as output is decreasing, change dressing  [name], Alexandra M. 05/11/2013, 9:03 AM

## 2013-05-12 LAB — BASIC METABOLIC PANEL
Chloride: 98 mEq/L (ref 96–112)
GFR calc Af Amer: >90 mL/min (ref >90)
GFR calc non Af Amer: >90 mL/min (ref >90)
Glucose, Bld: 176 mg/dL — ABNORMAL HIGH (ref 70–99)
Potassium: 3.5 mEq/L (ref 3.5–5.1)
Sodium: 137 mEq/L (ref 135–145)

## 2013-05-12 LAB — PHOSPHORUS: Phosphorus: 3 mg/dL (ref 2.3–4.6)

## 2013-05-12 LAB — MAGNESIUM: Magnesium: 1.4 mg/dL — ABNORMAL LOW (ref 1.5–2.5)

## 2013-05-12 MED ORDER — MAGNESIUM CITRATE PO SOLN
0.5000 | Freq: Once | ORAL | Status: AC
  Administered 2013-05-12: 0.5 via ORAL
  Filled 2013-05-12: qty 296

## 2013-05-12 MED ORDER — PROMETHAZINE HCL 25 MG/ML IJ SOLN
12.5000 mg | Freq: Four times a day (QID) | INTRAMUSCULAR | Status: DC | PRN
  Administered 2013-05-12: 12.5 mg via INTRAVENOUS
  Filled 2013-05-12: qty 1

## 2013-05-12 MED ORDER — FLEET ENEMA 7-19 GM/118ML RE ENEM: 1.0000 | ENEMA | Freq: Once | RECTAL | Status: DC

## 2013-05-12 NOTE — Progress Notes (Signed)
Physical Therapy Treatment Patient Details Name: [name] MRN: [id number] DOB: [birth year] Today's Date: 05/12/2013 Time: ID:[id number] PT Time Calculation (min): 24 min  PT Assessment / Plan / Recommendation  History of Present Illness 57 y.o. female admitted to [facility] on 05/08/13 for elective T9-S1 fusion.  She was in surgery ~12 hours and remined intubated overnight.  Extubated in am 05/09/13.     PT Comments   Overall pt moves well just slowly & fatigues quickly.   States LE's feel better/stronger today.  Completed stair training this session.  Pt safe to d/c home from mobility standpoint when MD feels medically ready.    Follow Up Recommendations  Home health PT;Supervision for mobility/OOB     Does the patient have the potential to tolerate intense rehabilitation     Barriers to Discharge        Equipment Recommendations       Recommendations for Other Services    Frequency Min 5X/week   Progress towards PT Goals Progress towards PT goals: Progressing toward goals  Plan Current plan remains appropriate    Precautions / Restrictions Precautions Precautions: Back Required Braces or Orthoses: Spinal Brace Spinal Brace: Lumbar corset;Applied in sitting position Restrictions Weight Bearing Restrictions: No       Mobility  Bed Mobility Bed Mobility: Left Sidelying to Sit;Sitting - Scoot to Edge of Bed Left Sidelying to Sit: HOB flat;5: Supervision;With rails Sitting - Scoot to Edge of Bed: 6: Modified independent (Device/Increase time) Transfers Transfers: Sit to Stand;Stand to Sit Sit to Stand: 4: Min guard;With upper extremity assist;From bed Stand to Sit: 4: Min guard;With upper extremity assist;With armrests;To chair/3-in-1 Details for Transfer Assistance: min cueing to reinforce hand placement Ambulation/Gait Ambulation/Gait Assistance: 4: Min guard Ambulation Distance (Feet): 60 Feet Assistive device: Rolling walker Ambulation/Gait Assistance Details: Pt with  slow but steady gait.  Encouragement to keep sequencing more fluid Gait Pattern: Step-through pattern;Decreased weight shift to left Gait velocity: decreased Stairs: Yes Stairs Assistance: 4: Min assist Stairs Assistance Details (indicate cue type and reason): (A) for RW management.  Cues for sequencing & technique.  Pt's husband assisted with 2nd trial Stair Management Technique: No rails;Backwards;With walker Number of Stairs: 2 (2x's) Wheelchair Mobility Wheelchair Mobility: No      PT Goals (current goals can now be found in the care plan section) Acute Rehab PT Goals PT Goal Formulation: With patient Time For Goal Achievement: 05/16/13 Potential to Achieve Goals: Good  Visit Information  Last PT Received On: 05/12/13 Assistance Needed: +1 History of Present Illness: 57 y.o. female admitted to [facility] on 05/08/13 for elective T9-S1 fusion.  She was in surgery ~12 hours and remined intubated overnight.  Extubated in am 05/09/13.      Subjective Data      Cognition  Cognition Arousal/Alertness: Awake/alert Behavior During Therapy: WFL for tasks assessed/performed Overall Cognitive Status: Within Functional Limits for tasks assessed    Balance     End of Session PT - End of Session Equipment Utilized During Treatment: Gait belt;Back brace Activity Tolerance: Patient tolerated treatment well Patient left: in chair;with call bell/phone within reach;with family/visitor present Nurse Communication: Mobility status   GP     Sena Hitch 05/12/2013, 9:45 AM  Sarajane Marek, PTA [phone] 05/12/2013

## 2013-05-12 NOTE — Progress Notes (Signed)
[name]  MRN: [id number] DOB/Age: 03/27/1956 57 y.o. Physician: Rada Hay Procedure: Procedure(s) (LRB): T9-S1 INSTRUMENTED FUSION T12 -S1 DECOMPRESSION (N/A)     Subjective: Vomited this am and threw all of am pills up. I ordered IV phenergan and this has not happened yet but no further vomiting  Vital Signs Temp:  [98.8 F (37.1 C)-99 F (37.2 C)] 99 F (37.2 C) (11/09 0542) Pulse Rate:  [100-104] 100 (11/09 0542) Resp:  [16-18] 18 (11/09 0542) BP: (142-153)/(60-89) 146/83 mmHg (11/09 0944) SpO2:  [90 %-93 %] 93 % (11/09 0542)  Lab Results  Recent Labs  05/10/13 0400  WBC 13.4*  HGB 10.6*  HCT 30.3*  PLT 115*   BMET  Recent Labs  05/11/13 0415 05/12/13 0555  NA 135 137  K 4.0 3.5  CL 98 98  CO2 30 29  GLUCOSE 140* 176*  BUN 8 10  CREATININE 0.64 0.74  CALCIUM 8.7 9.0   INR  Date Value Range Status  05/08/2013 1.23  0.00 - 1.49 Final     Exam Drain that was sutured in removed with out difficulty        Plan Cont IVF and prn for nausea If doing well home tomorrow  [facility] for Dr.Kevin Supple 05/12/2013, 11:48 AM

## 2013-05-13 LAB — GLUCOSE, CAPILLARY: Glucose-Capillary: 95 mg/dL (ref 70–99)

## 2013-05-13 MED ORDER — POLYETHYLENE GLYCOL 3350 17 GM/SCOOP PO POWD: 17.0000 g | Freq: Every day | ORAL | Status: DC

## 2013-05-13 MED ORDER — OXYCODONE-ACETAMINOPHEN 10-325 MG PO TABS: 1.0000 | ORAL_TABLET | ORAL | Status: DC | PRN

## 2013-05-13 MED ORDER — ONDANSETRON HCL 4 MG PO TABS: 4.0000 mg | ORAL_TABLET | Freq: Three times a day (TID) | ORAL | Status: DC | PRN

## 2013-05-13 MED ORDER — DOCUSATE SODIUM 100 MG PO CAPS: 100.0000 mg | ORAL_CAPSULE | Freq: Three times a day (TID) | ORAL | Status: DC | PRN

## 2013-05-13 MED ORDER — METHOCARBAMOL 500 MG PO TABS: 500.0000 mg | ORAL_TABLET | Freq: Three times a day (TID) | ORAL | Status: DC | PRN

## 2013-05-13 MED ORDER — ONDANSETRON HCL 4 MG PO TABS
4.0000 mg | ORAL_TABLET | Freq: Once | ORAL | Status: AC
  Administered 2013-05-13: 4 mg via ORAL

## 2013-05-13 NOTE — Discharge Summary (Signed)
Patient ID: MIYANA BEARDMORE MRN: [id number] DOB/AGE: [birth year] 57 y.o.  Admit date: 05/08/2013 Discharge date: 05/13/2013  Admission Diagnoses:  Active Problems:   Acute respiratory failure   HTN (hypertension)   Type II or unspecified type diabetes mellitus without mention of complication, not stated as uncontrolled   Discharge Diagnoses:  Active Problems:   Acute respiratory failure   HTN (hypertension)   Type II or unspecified type diabetes mellitus without mention of complication, not stated as uncontrolled  status post Procedure(s): T9-S1 INSTRUMENTED FUSION T12 -S1 DECOMPRESSION  Past Medical History  Diagnosis Date  . Diabetes mellitus without complication   . Breast cancer   . PONV (postoperative nausea and vomiting)   . Dysrhythmia     atrial fib/dr [place] [facility]  . Asthma   . Pneumonia     hx  . Heart murmur   . History of recurrent UTIs   . Sepsis 05    kidney stone infection  . GERD (gastroesophageal reflux disease)   . Headache(784.0)   . Arthritis     Surgeries: Procedure(s): T9-S1 INSTRUMENTED FUSION T12 -S1 DECOMPRESSION on 05/08/2013   Consultants:    Discharged Condition: Improved  Hospital Course: NYLIA SWEIGART is an 57 y.o. female who was admitted 05/08/2013 for operative treatment of <principal problem not specified>. Patient failed conservative treatments (please see the history and physical for the specifics) and had severe unremitting pain that affects sleep, daily activities and work/hobbies. After pre-op clearance, the patient was taken to the operating room on 05/08/2013 and underwent  Procedure(s): T9-S1 INSTRUMENTED FUSION T12 -S1 DECOMPRESSION.    Patient was given perioperative antibiotics: Anti-infectives   Start     Dose/Rate Route Frequency Ordered Stop   05/10/13 1000  ceFAZolin (ANCEF) IVPB 1 g/50 mL premix  Status:  Discontinued     1 g 100 mL/hr over 30 Minutes Intravenous Every 8 hours 05/10/13 0948  05/12/13 1110   05/09/13 0200  ceFAZolin (ANCEF) IVPB 1 g/50 mL premix  Status:  Discontinued     1 g 100 mL/hr over 30 Minutes Intravenous Every 8 hours 05/08/13 2025 05/10/13 0948   05/08/13 1315  ceFAZolin (ANCEF) IVPB 2 g/50 mL premix  Status:  Discontinued     2 g 100 mL/hr over 30 Minutes Intravenous To Surgery 05/08/13 1305 05/08/13 2016   05/08/13 1305  vancomycin (VANCOCIN) powder  Status:  Discontinued       As needed 05/08/13 0945 05/08/13 1959   05/07/13 1423  ceFAZolin (ANCEF) IVPB 2 g/50 mL premix     2 g 100 mL/hr over 30 Minutes Intravenous 30 min pre-op 05/07/13 1423 05/08/13 1722       Patient was given sequential compression devices and early ambulation to prevent DVT.   Patient benefited maximally from hospital stay and there were no complications. At the time of discharge, the patient was urinating/moving their bowels without difficulty, tolerating a regular diet, pain is controlled with oral pain medications and they have been cleared by PT/OT.   Recent vital signs: Patient Vitals for the past 24 hrs:  BP Temp Temp src Pulse Resp SpO2  05/13/13 0539 132/62 mmHg 99.4 F (37.4 C) - 97 18 90 %  05/12/13 2100 128/67 mmHg 98.9 F (37.2 C) Oral 94 18 92 %  05/12/13 1349 124/61 mmHg 98.9 F (37.2 C) - 100 16 92 %  05/12/13 0944 146/83 mmHg - - - - -     Recent laboratory studies:  Recent Labs  05/11/13 0415 05/12/13 0555  NA 135 137  K 4.0 3.5  CL 98 98  CO2 30 29  BUN 8 10  CREATININE 0.64 0.74  GLUCOSE 140* 176*  CALCIUM 8.7 9.0     Discharge Medications:     Medication List    STOP taking these medications       tiZANidine 4 MG tablet  Commonly known as:  ZANAFLEX     traMADol 50 MG tablet  Commonly known as:  ULTRAM      TAKE these medications       albuterol 108 (90 BASE) MCG/ACT inhaler  Commonly known as:  PROVENTIL HFA;VENTOLIN HFA  Inhale 2 puffs into the lungs every 6 (six) hours as needed for wheezing or shortness of breath.       carvedilol 6.25 MG tablet  Commonly known as:  COREG  Take 6.25 mg by mouth 2 (two) times daily with a meal.     docusate sodium 100 MG capsule  Commonly known as:  COLACE  Take 1 capsule (100 mg total) by mouth 3 (three) times daily as needed for mild constipation.     flecainide 50 MG tablet  Commonly known as:  TAMBOCOR  Take 50 mg by mouth 2 (two) times daily.     fluticasone 50 MCG/ACT nasal spray  Commonly known as:  FLONASE  Place 2 sprays into the nose daily as needed for rhinitis or allergies.     glipiZIDE 5 MG 24 hr tablet  Commonly known as:  GLUCOTROL XL  Take 5-10 mg by mouth 2 (two) times daily. 2 tablets every morning and 1 tablet every evening     lisinopril-hydrochlorothiazide 10-12.5 MG per tablet  Commonly known as:  PRINZIDE,ZESTORETIC  Take 1 tablet by mouth daily.     loratadine 10 MG tablet  Commonly known as:  CLARITIN  Take 10 mg by mouth daily.     metFORMIN 1000 MG tablet  Commonly known as:  GLUCOPHAGE  Take 1,000 mg by mouth 2 (two) times daily with a meal.     methocarbamol 500 MG tablet  Commonly known as:  ROBAXIN  Take 1 tablet (500 mg total) by mouth 3 (three) times daily as needed for muscle spasms.     multivitamin with minerals Tabs tablet  Take 1 tablet by mouth daily.     nitrofurantoin 50 MG capsule  Commonly known as:  MACRODANTIN  Take 50 mg by mouth every morning.     omeprazole 20 MG capsule  Commonly known as:  PRILOSEC  Take 20 mg by mouth.     ondansetron 4 MG tablet  Commonly known as:  ZOFRAN  Take 1 tablet (4 mg total) by mouth every 8 (eight) hours as needed for nausea or vomiting.     oxyCODONE-acetaminophen 10-325 MG per tablet  Commonly known as:  PERCOCET  Take 1 tablet by mouth every 4 (four) hours as needed for pain.     polyethylene glycol powder powder  Commonly known as:  GLYCOLAX  Take 17 g by mouth daily.     rosuvastatin 10 MG tablet  Commonly known as:  CRESTOR  Take 10 mg by mouth at  bedtime.     sitaGLIPtin 100 MG tablet  Commonly known as:  JANUVIA  Take 100 mg by mouth daily.     VITAMIN C PO  Take 1 tablet by mouth daily.     XARELTO 20 MG Tabs tablet  Generic drug:  Rivaroxaban  Take 20 mg by mouth daily.        Diagnostic Studies: Dg Chest 2 View  05/03/2013   CLINICAL DATA:  Pre-op respiratory exam. Lumbar spine surgery. Diabetes and hypertension.  EXAM: CHEST  2 VIEW  COMPARISON:  09/13/2012 and 04/17/2012  FINDINGS: The heart size and mediastinal contours are within normal limits. Both lungs are clear, except for mild scarring in the lateral left lung base. Surgical clips are again seen in the left axilla. Thoracolumbar spine degenerative changes and scoliosis are stable in appearance.  IMPRESSION: No active cardiopulmonary disease.   Electronically Signed   By: Earle Gell M.D.   On: 05/03/2013 15:34   Dg Lumbar Spine Complete  05/08/2013   CLINICAL DATA:  59-year- female undergoing T9-S1 posterior spinal fusion. Initial encounter.  EXAM: DG C-ARM GT 120 MIN; LUMBAR SPINE - COMPLETE 4+ VIEW  TECHNIQUE: Eleven intraoperative fluoroscopic views of the spine.  CONTRAST:  None.  COMPARISON:  Chest radiographs 05/03/2013. CT Abdomen and Pelvis 07/12/2012.  FLUOROSCOPY TIME:  7 min and 26 seconds.  FINDINGS: These images demonstrate widespread transpedicular screw placement in the thoracic and lumbar spine with posterior connecting rods placed. Transpedicular screws extend to the S1 level. Enteric tube is in place. The most cephalad thoracic level demonstrates a unilateral right pedicle screw.  IMPRESSION: Widespread thoracolumbar posterior spinal fusion depicted.   Electronically Signed   By: Lars Pinks M.D.   On: 05/08/2013 19:33   Dg Chest Port 1 View  05/10/2013   CLINICAL DATA:  Assess edema, history breast cancer, diabetes mellitus  EXAM: PORTABLE CHEST - 1 VIEW  COMPARISON:  Portable exam [id number] hr compared to 05/09/2013  FINDINGS: Enlargement of cardiac  silhouette.  Mediastinal contours and pulmonary vascularity normal.  Linear subsegmental atelectasis right base.  Persistent left basilar opacity question atelectasis versus consolidation.  Upper lungs clear.  Atherosclerotic calcification aorta.  No gross pleural effusion or pneumothorax.  Bones unremarkable.  IMPRESSION: Minimal right basilar atelectasis with persistent mild atelectasis versus consolidation at left base.  Enlargement of cardiac silhouette.   Electronically Signed   By: Lavonia Dana M.D.   On: 05/10/2013 07:59   Dg Chest Port 1 View  05/09/2013   CLINICAL DATA:  Status post thoracic and lumbar fusion.  EXAM: PORTABLE CHEST - 1 VIEW  COMPARISON:  May 08, 2013  FINDINGS: The endotracheal tube tip lies at the level of the clavicular heads. The esophagogastric tube tip projects off the film but the proximal port is below the GE junction. The left hemidiaphragm remains partially obscured. The cardiopericardial silhouette is normal in size. The pulmonary interstitial markings are slightly more conspicuous today than on the earlier study but this may be due to technical factors. There is no discrete alveolar infiltrate but retrocardiac atelectasis on the left is suspected.  IMPRESSION: There has not been significant interval change in the appearance of the chest since yesterday's study.   Electronically Signed   By: David  Martinique   On: 05/09/2013 08:00   Dg Chest Port 1 View  05/08/2013   CLINICAL DATA:  Respiratory failure, reset thoracic and lumbar spinal fusion  EXAM: PORTABLE CHEST - 1 VIEW  COMPARISON:  05/03/2013  FINDINGS: Endotracheal tube 13 mm above the Carina. NG tube below the hemidiaphragms extending into the stomach. Mild cardiac enlargement with low lung volumes. Streaky left base atelectasis noted. No effusion or pneumothorax. Atherosclerosis of the aorta.  IMPRESSION: Support apparatus as above.  Low volume exam with  left base atelectasis.   Electronically Signed   By: Daryll Brod M.D.   On: 05/08/2013 20:53   Dg C-arm Gt 120 Min  05/08/2013   CLINICAL DATA:  77-year- female undergoing T9-S1 posterior spinal fusion. Initial encounter.  EXAM: DG C-ARM GT 120 MIN; LUMBAR SPINE - COMPLETE 4+ VIEW  TECHNIQUE: Eleven intraoperative fluoroscopic views of the spine.  CONTRAST:  None.  COMPARISON:  Chest radiographs 05/03/2013. CT Abdomen and Pelvis 07/12/2012.  FLUOROSCOPY TIME:  7 min and 26 seconds.  FINDINGS: These images demonstrate widespread transpedicular screw placement in the thoracic and lumbar spine with posterior connecting rods placed. Transpedicular screws extend to the S1 level. Enteric tube is in place. The most cephalad thoracic level demonstrates a unilateral right pedicle screw.  IMPRESSION: Widespread thoracolumbar posterior spinal fusion depicted.   Electronically Signed   By: Lars Pinks M.D.   On: 05/08/2013 19:33         Future Appointments Provider Department Dept Phone   04/18/2014 10:00 AM Gwendolyn A [place] [phone]   04/18/2014 10:30 AM [name], MD [place] [phone]      Follow-up Information   Follow up with Dahlia Bailiff, MD. Schedule an appointment as soon as possible for a visit in 1 week.   Specialty:  Orthopedic Surgery   Contact information:   [address] [place] [address] [phone]       Discharge Plan:  discharge to home  Disposition: stable    Signed: Melina Schools D for Dr. Melina Schools [facility] [phone] 05/13/2013, 8:16 AM

## 2013-05-13 NOTE — Progress Notes (Signed)
    Subjective: Procedure(s) (LRB): T9-S1 INSTRUMENTED FUSION T12 -S1 DECOMPRESSION (N/A) 5 Days Post-Op  Patient reports pain as 4 on 0-10 scale.  Reports decreased leg pain reports incisional back pain   Positive void Positive bowel movement Positive flatus Negative chest pain or shortness of breath  Objective: Vital signs in last 24 hours: Temp:  [98.9 F (37.2 C)-99.4 F (37.4 C)] 99.4 F (37.4 C) (11/10 0539) Pulse Rate:  [94-100] 97 (11/10 0539) Resp:  [16-18] 18 (11/10 0539) BP: (124-146)/(61-83) 132/62 mmHg (11/10 0539) SpO2:  [90 %-92 %] 90 % (11/10 0539)  Intake/Output from previous day: 11/09 0701 - 11/10 0700 In: 403 [P.O.:400; I.V.:3] Out: -   Labs: No results found for this basename: WBC, RBC, HCT, PLT,  in the last 72 hours  Recent Labs  05/11/13 0415 05/12/13 0555  NA 135 137  K 4.0 3.5  CL 98 98  CO2 30 29  BUN 8 10  CREATININE 0.64 0.74  GLUCOSE 140* 176*  CALCIUM 8.7 9.0   No results found for this basename: LABPT, INR,  in the last 72 hours  Physical Exam: Neurologically intact ABD soft Neurovascular intact Incision: dressing C/D/I and no drainage Compartment soft  Assessment/Plan: Patient stable  xrays N/A Continue mobilization with physical therapy Continue care  Advance diet Up with therapy D/C IV fluids Discharge home with home health  Melina Schools, MD [place] [phone]

## 2013-05-13 NOTE — Progress Notes (Signed)
ContactMarland Kitchen [place], [place] [id number]   [phone]  05/13/13 Spoke with patient about [place], she selected [facility], spoke with Arbie Cookey, set up HHPT, faxed facesheet, order, and d/c summary as requested to [phone], received confirmation. Patient states that she has a rolling walker and bedside commode. No equipment needs identified.Patient and her sister state that she will have assistance 24/7 for at least 2 weeks.Fuller Plan RN, BSN, [place]       05-19-13 3:30pm Luz Lex, [place] [phone] patient now extubated - looks good moving around in bed well.  Husband at bedside.  Has walker with wheels at home. PT to see.  CM will continue to follow for further discharge needs.

## 2013-05-13 NOTE — Progress Notes (Signed)
Physical Therapy Treatment Patient Details Name: [name] MRN: [id number] DOB: Sep 01, 1955 Today's Date: 05/13/2013 Time: 1030-1056 PT Time Calculation (min): 26 min  PT Assessment / Plan / Recommendation  History of Present Illness 57 y.o. female admitted to [facility] on 05/08/13 for elective T9-S1 fusion.  She was in surgery ~12 hours and remined intubated overnight.  Extubated in am 05/09/13.     PT Comments   Pt making good progress towards physical therapy goals. Increased pain reported in LLE during ambulation, and pt states it takes quite a while to improve even with medication. Pt left in side lying position for comfort with pillow between knees for support. Spoke with care management team regarding 3-in-1 commode for home, and pt states her husband finished the ramp at their home last night for entry.  Follow Up Recommendations  Home health PT;Supervision for mobility/OOB     Does the patient have the potential to tolerate intense rehabilitation     Barriers to Discharge        Equipment Recommendations  3in1 (PT)    Recommendations for Other Services    Frequency Min 5X/week   Progress towards PT Goals Progress towards PT goals: Progressing toward goals  Plan Current plan remains appropriate    Precautions / Restrictions Precautions Precautions: Back;Fall Precaution Booklet Issued: Yes (comment) Precaution Comments: Pt able to recall 3/3 precautions  Required Braces or Orthoses: Spinal Brace Spinal Brace: Lumbar corset;Applied in sitting position Restrictions Weight Bearing Restrictions: No   Pertinent Vitals/Pain 7/10 after ambulation, S/L in bed with pillow between knees    Mobility  Bed Mobility Bed Mobility: Rolling Left;Left Sidelying to Sit;Sitting - Scoot to [employer] of Bed;Sit to Sidelying Left;Rolling Right Rolling Right: 5: Supervision Rolling Left: 5: Supervision Left Sidelying to Sit: 5: Supervision;HOB flat Sitting - Scoot to Edge of Bed: 6: Modified  independent (Device/Increase time) Sit to Sidelying Left: 5: Supervision Details for Bed Mobility Assistance: VC's to maintain back precautions during transition to/from EOB, with pillow placed between pt's knees for comfort. Transfers Transfers: Sit to Stand;Stand to Sit Sit to Stand: 4: Min guard;From bed;With upper extremity assist Stand to Sit: 4: Min guard;To bed;With upper extremity assist Details for Transfer Assistance: Pt demonstrated proper hand placement and safety awareness. Ambulation/Gait Ambulation/Gait Assistance: 5: Supervision Ambulation Distance (Feet): 50 Feet Assistive device: Rolling walker Ambulation/Gait Assistance Details: No LOB noted and pt maintained good posture throughout gait training. 2 standing rest breaks were required due to LLE pain prior to returning to room. Gait Pattern: Step-through pattern;Decreased weight shift to left Gait velocity: decreased Stairs: No Stairs Assistance Details (indicate cue type and reason): Pt reports she is confident with stairs, however husband built her a ramp last night.    Exercises     PT Diagnosis:    PT Problem List:   PT Treatment Interventions:     PT Goals (current goals can now be found in the care plan section) Acute Rehab PT Goals Patient Stated Goal: to go home PT Goal Formulation: With patient Time For Goal Achievement: 05/16/13 Potential to Achieve Goals: Good  Visit Information  Last PT Received On: 05/13/13 Assistance Needed: +1 History of Present Illness: 57 y.o. female admitted to [facility] on 05/08/13 for elective T9-S1 fusion.  She was in surgery ~12 hours and remined intubated overnight.  Extubated in am 05/09/13.      Subjective Data  Subjective: "I'm doing okay right now. She just gave me some pain medicine." Patient Stated Goal: to go  home   Cognition  Cognition Arousal/Alertness: Awake/alert Behavior During Therapy: WFL for tasks assessed/performed Overall Cognitive Status: Within Functional  Limits for tasks assessed    Balance  Balance Balance Assessed: Yes Static Sitting Balance Static Sitting - Balance Support: Bilateral upper extremity supported;Feet supported Static Sitting - Level of Assistance: 6: Modified independent (Device/Increase time) Dynamic Standing Balance Dynamic Standing - Balance Support: Bilateral upper extremity supported Dynamic Standing - Level of Assistance: 5: Stand by assistance Dynamic Standing - Balance Activities: Lateral lean/weight shifting;Forward lean/weight shifting;Reaching across midline Dynamic Standing - Comments: 2 minutes  End of Session PT - End of Session Equipment Utilized During Treatment: Gait belt;Back brace Activity Tolerance: Patient tolerated treatment well Patient left: in bed;with call bell/phone within reach;with family/visitor present Nurse Communication: Mobility status   GP     Jolyn Lent 05/13/2013, 12:16 PM  Jolyn Lent, [place], DPT [phone]

## 2013-05-15 ENCOUNTER — Observation Stay (HOSPITAL_COMMUNITY)
Admission: AD | Admit: 2013-05-15 | Discharge: 2013-05-17 | Disposition: A | Discharge status: Skilled Nursing Facility | Payer: Federal, State, Local not specified - PPO | Source: Ambulatory Visit | Attending: Orthopedic Surgery | Admitting: Orthopedic Surgery

## 2013-05-15 ENCOUNTER — Inpatient Hospital Stay (HOSPITAL_COMMUNITY): Payer: Federal, State, Local not specified - PPO

## 2013-05-15 ENCOUNTER — Encounter (HOSPITAL_COMMUNITY): Payer: Self-pay | Admitting: Orthopedic Surgery

## 2013-05-15 DIAGNOSIS — I509 Heart failure, unspecified: Secondary | ICD-10-CM | POA: Insufficient documentation

## 2013-05-15 DIAGNOSIS — E119 Type 2 diabetes mellitus without complications: Secondary | ICD-10-CM | POA: Insufficient documentation

## 2013-05-15 DIAGNOSIS — Z853 Personal history of malignant neoplasm of breast: Secondary | ICD-10-CM | POA: Insufficient documentation

## 2013-05-15 DIAGNOSIS — K5900 Constipation, unspecified: Secondary | ICD-10-CM | POA: Insufficient documentation

## 2013-05-15 DIAGNOSIS — Z981 Arthrodesis status: Secondary | ICD-10-CM | POA: Insufficient documentation

## 2013-05-15 DIAGNOSIS — R609 Edema, unspecified: Secondary | ICD-10-CM | POA: Insufficient documentation

## 2013-05-15 DIAGNOSIS — IMO0002 Reserved for concepts with insufficient information to code with codable children: Principal | ICD-10-CM | POA: Insufficient documentation

## 2013-05-15 DIAGNOSIS — I10 Essential (primary) hypertension: Secondary | ICD-10-CM | POA: Insufficient documentation

## 2013-05-15 DIAGNOSIS — D251 Intramural leiomyoma of uterus: Secondary | ICD-10-CM | POA: Insufficient documentation

## 2013-05-15 DIAGNOSIS — E876 Hypokalemia: Secondary | ICD-10-CM | POA: Insufficient documentation

## 2013-05-15 DIAGNOSIS — M4325 Fusion of spine, thoracolumbar region: Secondary | ICD-10-CM

## 2013-05-15 DIAGNOSIS — M545 Low back pain, unspecified: Secondary | ICD-10-CM | POA: Insufficient documentation

## 2013-05-15 HISTORY — DX: Calculus of kidney: N20.0

## 2013-05-15 HISTORY — DX: Pure hypercholesterolemia, unspecified: E78.00

## 2013-05-15 HISTORY — DX: Unspecified chronic bronchitis: J42

## 2013-05-15 HISTORY — DX: Heart failure, unspecified: I50.9

## 2013-05-15 HISTORY — DX: Tension-type headache, unspecified, not intractable: G44.209

## 2013-05-15 HISTORY — DX: Other chronic pain: G89.29

## 2013-05-15 HISTORY — DX: Low back pain, unspecified: M54.50

## 2013-05-15 HISTORY — DX: Urinary tract infection, site not specified: N39.0

## 2013-05-15 HISTORY — DX: Type 2 diabetes mellitus without complications: E11.9

## 2013-05-15 HISTORY — DX: Migraine, unspecified, not intractable, without status migrainosus: G43.909

## 2013-05-15 HISTORY — DX: Low back pain: M54.5

## 2013-05-15 HISTORY — DX: Family history of other specified conditions: Z84.89

## 2013-05-15 LAB — COMPREHENSIVE METABOLIC PANEL
Alkaline Phosphatase: 45 U/L (ref 39–117)
BUN: 14 mg/dL (ref 6–23)
CO2: 29 mEq/L (ref 19–32)
Chloride: 94 mEq/L — ABNORMAL LOW (ref 96–112)
Creatinine, Ser: 0.63 mg/dL (ref 0.50–1.10)
GFR calc Af Amer: >90 mL/min (ref >90)
GFR calc non Af Amer: >90 mL/min (ref >90)
Glucose, Bld: 100 mg/dL — ABNORMAL HIGH (ref 70–99)
Potassium: 2.8 mEq/L — ABNORMAL LOW (ref 3.5–5.1)
Total Bilirubin: 0.3 mg/dL (ref 0.3–1.2)
Total Protein: 6.3 g/dL (ref 6.0–8.3)

## 2013-05-15 LAB — CBC
MCHC: 35.6 g/dL (ref 30.0–36.0)
Platelets: 286 10*3/uL (ref 150–400)
RBC: 3.1 MIL/uL — ABNORMAL LOW (ref 3.87–5.11)
RDW: 13.8 % (ref 11.5–15.5)
WBC: 11.5 10*3/uL — ABNORMAL HIGH (ref 4.0–10.5)

## 2013-05-15 MED ORDER — FLUTICASONE PROPIONATE 50 MCG/ACT NA SUSP
2.0000 | Freq: Every day | NASAL | Status: DC | PRN
  Filled 2013-05-15: qty 16

## 2013-05-15 MED ORDER — RIVAROXABAN 20 MG PO TABS
20.0000 mg | ORAL_TABLET | Freq: Every day | ORAL | Status: DC
  Administered 2013-05-15 – 2013-05-17 (×3): 20 mg via ORAL
  Filled 2013-05-15 (×3): qty 1

## 2013-05-15 MED ORDER — ONDANSETRON HCL 4 MG PO TABS: 4.0000 mg | ORAL_TABLET | Freq: Three times a day (TID) | ORAL | Status: DC | PRN

## 2013-05-15 MED ORDER — ATORVASTATIN CALCIUM 20 MG PO TABS
20.0000 mg | ORAL_TABLET | Freq: Every day | ORAL | Status: DC
  Administered 2013-05-16 – 2013-05-17 (×2): 20 mg via ORAL
  Filled 2013-05-15 (×3): qty 1

## 2013-05-15 MED ORDER — MAGNESIUM CITRATE PO SOLN
1.0000 | Freq: Once | ORAL | Status: AC
  Administered 2013-05-15: 1 via ORAL
  Filled 2013-05-15: qty 296

## 2013-05-15 MED ORDER — ALBUTEROL SULFATE HFA 108 (90 BASE) MCG/ACT IN AERS
2.0000 | INHALATION_SPRAY | Freq: Four times a day (QID) | RESPIRATORY_TRACT | Status: DC | PRN
  Filled 2013-05-15: qty 6.7

## 2013-05-15 MED ORDER — METHOCARBAMOL 500 MG PO TABS: 500.0000 mg | ORAL_TABLET | Freq: Three times a day (TID) | ORAL | Status: DC | PRN

## 2013-05-15 MED ORDER — VITAMIN C 500 MG PO TABS
500.0000 mg | ORAL_TABLET | Freq: Every day | ORAL | Status: DC
  Administered 2013-05-16 – 2013-05-17 (×2): 500 mg via ORAL
  Filled 2013-05-15 (×3): qty 1

## 2013-05-15 MED ORDER — LISINOPRIL-HYDROCHLOROTHIAZIDE 10-12.5 MG PO TABS: 1.0000 | ORAL_TABLET | Freq: Every day | ORAL | Status: DC

## 2013-05-15 MED ORDER — LISINOPRIL 10 MG PO TABS
10.0000 mg | ORAL_TABLET | Freq: Every day | ORAL | Status: DC
  Administered 2013-05-16 – 2013-05-17 (×2): 10 mg via ORAL
  Filled 2013-05-15 (×2): qty 1

## 2013-05-15 MED ORDER — GLIPIZIDE ER 5 MG PO TB24
10.0000 mg | ORAL_TABLET | Freq: Every day | ORAL | Status: DC
  Administered 2013-05-16 – 2013-05-17 (×2): 10 mg via ORAL
  Filled 2013-05-15 (×3): qty 2

## 2013-05-15 MED ORDER — GLIPIZIDE 5 MG PO TABS
5.0000 mg | ORAL_TABLET | Freq: Every day | ORAL | Status: DC
  Administered 2013-05-15 – 2013-05-17 (×3): 5 mg via ORAL
  Filled 2013-05-15 (×4): qty 1

## 2013-05-15 MED ORDER — LINAGLIPTIN 5 MG PO TABS
5.0000 mg | ORAL_TABLET | Freq: Every day | ORAL | Status: DC
  Administered 2013-05-16 – 2013-05-17 (×2): 5 mg via ORAL
  Filled 2013-05-15 (×2): qty 1

## 2013-05-15 MED ORDER — POLYETHYLENE GLYCOL 3350 17 GM/SCOOP PO POWD
17.0000 g | Freq: Every day | ORAL | Status: DC
  Administered 2013-05-15: 17 g via ORAL
  Filled 2013-05-15: qty 255

## 2013-05-15 MED ORDER — DOCUSATE SODIUM 100 MG PO CAPS
100.0000 mg | ORAL_CAPSULE | Freq: Three times a day (TID) | ORAL | Status: DC | PRN
  Administered 2013-05-15: 100 mg via ORAL
  Filled 2013-05-15: qty 1

## 2013-05-15 MED ORDER — FLECAINIDE ACETATE 50 MG PO TABS
50.0000 mg | ORAL_TABLET | Freq: Two times a day (BID) | ORAL | Status: DC
  Administered 2013-05-16 – 2013-05-17 (×3): 50 mg via ORAL
  Filled 2013-05-15 (×5): qty 1

## 2013-05-15 MED ORDER — HYDROCHLOROTHIAZIDE 12.5 MG PO CAPS
12.5000 mg | ORAL_CAPSULE | Freq: Every day | ORAL | Status: DC
  Administered 2013-05-16 – 2013-05-17 (×2): 12.5 mg via ORAL
  Filled 2013-05-15 (×2): qty 1

## 2013-05-15 MED ORDER — METFORMIN HCL 500 MG PO TABS
1000.0000 mg | ORAL_TABLET | Freq: Two times a day (BID) | ORAL | Status: DC
  Administered 2013-05-15 – 2013-05-17 (×5): 1000 mg via ORAL
  Filled 2013-05-15 (×7): qty 2

## 2013-05-15 MED ORDER — OXYCODONE-ACETAMINOPHEN 5-325 MG PO TABS
1.0000 | ORAL_TABLET | ORAL | Status: DC | PRN
  Administered 2013-05-15: 2 via ORAL
  Administered 2013-05-15 – 2013-05-16 (×2): 1 via ORAL
  Administered 2013-05-16: 2 via ORAL
  Administered 2013-05-16: 1 via ORAL
  Administered 2013-05-16: 2 via ORAL
  Administered 2013-05-16 – 2013-05-17 (×2): 1 via ORAL
  Administered 2013-05-17 (×3): 2 via ORAL
  Filled 2013-05-15: qty 2
  Filled 2013-05-15: qty 1
  Filled 2013-05-15 (×3): qty 2
  Filled 2013-05-15 (×2): qty 1
  Filled 2013-05-15 (×2): qty 2
  Filled 2013-05-15 (×2): qty 1

## 2013-05-15 MED ORDER — DOCUSATE SODIUM 100 MG PO CAPS
100.0000 mg | ORAL_CAPSULE | Freq: Two times a day (BID) | ORAL | Status: DC
  Administered 2013-05-15 – 2013-05-17 (×2): 100 mg via ORAL
  Filled 2013-05-15 (×4): qty 1

## 2013-05-15 MED ORDER — LORATADINE 10 MG PO TABS
10.0000 mg | ORAL_TABLET | Freq: Every day | ORAL | Status: DC
  Administered 2013-05-15 – 2013-05-17 (×3): 10 mg via ORAL
  Filled 2013-05-15 (×3): qty 1

## 2013-05-15 MED ORDER — METHOCARBAMOL 500 MG PO TABS
500.0000 mg | ORAL_TABLET | Freq: Three times a day (TID) | ORAL | Status: DC | PRN
  Administered 2013-05-15 – 2013-05-17 (×6): 500 mg via ORAL
  Filled 2013-05-15 (×6): qty 1

## 2013-05-15 MED ORDER — PANTOPRAZOLE SODIUM 40 MG PO TBEC
40.0000 mg | DELAYED_RELEASE_TABLET | Freq: Every day | ORAL | Status: DC
  Administered 2013-05-15 – 2013-05-17 (×3): 40 mg via ORAL
  Filled 2013-05-15 (×3): qty 1

## 2013-05-15 MED ORDER — CARVEDILOL 6.25 MG PO TABS
6.2500 mg | ORAL_TABLET | Freq: Two times a day (BID) | ORAL | Status: DC
  Administered 2013-05-15 – 2013-05-17 (×5): 6.25 mg via ORAL
  Filled 2013-05-15 (×7): qty 1

## 2013-05-15 MED ORDER — FLEET ENEMA 7-19 GM/118ML RE ENEM
1.0000 | ENEMA | Freq: Once | RECTAL | Status: AC
  Administered 2013-05-15: 1 via RECTAL
  Filled 2013-05-15 (×2): qty 1

## 2013-05-15 MED ORDER — SENNOSIDES-DOCUSATE SODIUM 8.6-50 MG PO TABS
1.0000 | ORAL_TABLET | Freq: Every evening | ORAL | Status: DC | PRN
  Administered 2013-05-15: 1 via ORAL
  Filled 2013-05-15 (×2): qty 1

## 2013-05-15 NOTE — Progress Notes (Signed)
NURSING PROGRESS NOTE  [name] [id number] Admission Data: 05/15/2013 3:54 PM Attending Provider: Melina Schools, MD [id number] J., MD Code Status: Full  [name] is a 57 y.o. female patient admitted from ED:  -No acute distress noted.  -No complaints of shortness of breath.  -No complaints of chest pain.   Cardiac Monitoring:none    Blood pressure 137/88, pulse 89, temperature 98.6 F (37 C), temperature source Oral, resp. rate 16, SpO2 100.00%.   IV Fluids: No access Allergies:  Other; Cleocin; Morphine and related; and Pneumococcal vaccines  Past Medical History:   has a past medical history of Diabetes mellitus without complication; Breast cancer; PONV (postoperative nausea and vomiting); Dysrhythmia; Asthma; Pneumonia; Heart murmur; History of recurrent UTIs; Sepsis (05); GERD (gastroesophageal reflux disease); Headache(784.0); and Arthritis.  Past Surgical History:   has past surgical history that includes Breast surgery (Left, 02); Tubal ligation (85); Ovarian cyst surgery (Bilateral, 06); Lithotripsy; and Spinal fusion (N/A, 05/08/2013).  Social History:   reports that she has never smoked. She does not have any smokeless tobacco history on file. She reports that she does not drink alcohol or use illicit drugs.  Skin: patient has an surgical incision along mid back with an ABD dressing intact. She also has a small foam dressing to cover where she once had a JP drain. Otherwise her skin is clean/dry/intact   Patient/Family orientated to room. Information packet given to patient/family. Admission inpatient armband information verified with patient/family to include name and date of birth and placed on patient arm. Side rails up x 2, fall assessment and education completed with patient/family. Patient/family able to verbalize understanding of risk associated with falls and verbalized understanding to call for assistance before getting out of bed. Call light  within reach. Patient/family able to voice and demonstrate understanding of unit orientation instructions.    Will continue to evaluate and treat per MD orders.

## 2013-05-15 NOTE — Progress Notes (Signed)
Went to patient's room to assess her pain and offer pain medication and was informed by patient that she took her personal muscle relaxer and 1 percocet. I informed the patient that she was not allowed to take home medication and either she would have to send it to our pharmacy for storing or send it home with her parents who are in her room. She refused to send to pharmacy and agreed to send home with her parents. I explained in detail under NO circumstances was she to take home meds. I also gave reasons why that was not safe. Patient's mom took her meds.

## 2013-05-16 ENCOUNTER — Inpatient Hospital Stay (HOSPITAL_COMMUNITY): Payer: Federal, State, Local not specified - PPO

## 2013-05-16 LAB — GLUCOSE, CAPILLARY
Glucose-Capillary: 124 mg/dL — ABNORMAL HIGH (ref 70–99)
Glucose-Capillary: 141 mg/dL — ABNORMAL HIGH (ref 70–99)

## 2013-05-16 LAB — BASIC METABOLIC PANEL
BUN: 13 mg/dL (ref 6–23)
Calcium: 9.7 mg/dL (ref 8.4–10.5)
Chloride: 95 mEq/L — ABNORMAL LOW (ref 96–112)
GFR calc non Af Amer: >90 mL/min (ref >90)
Glucose, Bld: 119 mg/dL — ABNORMAL HIGH (ref 70–99)
Potassium: 3.4 mEq/L — ABNORMAL LOW (ref 3.5–5.1)

## 2013-05-16 MED ORDER — LORAZEPAM 2 MG/ML IJ SOLN
INTRAMUSCULAR | Status: AC
  Filled 2013-05-16: qty 1

## 2013-05-16 MED ORDER — LORAZEPAM 1 MG PO TABS: 1.0000 mg | ORAL_TABLET | Freq: Once | ORAL | Status: AC

## 2013-05-16 MED ORDER — POTASSIUM CHLORIDE CRYS ER 20 MEQ PO TBCR
40.0000 meq | EXTENDED_RELEASE_TABLET | Freq: Once | ORAL | Status: AC
  Administered 2013-05-16: 40 meq via ORAL
  Filled 2013-05-16: qty 2

## 2013-05-16 MED ORDER — INSULIN ASPART 100 UNIT/ML ~~LOC~~ SOLN: 0.0000 [IU] | Freq: Three times a day (TID) | SUBCUTANEOUS | Status: DC

## 2013-05-16 MED ORDER — LORAZEPAM 2 MG/ML IJ SOLN
1.0000 mg | Freq: Once | INTRAMUSCULAR | Status: AC
  Administered 2013-05-16: 1 mg via INTRAMUSCULAR
  Filled 2013-05-16: qty 1

## 2013-05-16 NOTE — Care Management Note (Signed)
  Page 2 of 2   05/17/2013     3:47:26 PM   CARE MANAGEMENT NOTE 05/17/2013  Patient:  TAI, CHIAO   Account Number:  [id number]  Date Initiated:  05/16/2013  Documentation initiated by:  Magdalen Spatz  Subjective/Objective Assessment:     Action/Plan:   Anticipated DC Date:  05/17/2013   Anticipated DC Plan:  [place]         Choice offered to / List presented to:  C-1 Patient   DME arranged  HOSPITAL BED      DME agency  [facility] arranged  [place] [facility]   Status of service:  Completed, signed off Medicare Important Message given?   (If response is "NO", the following Medicare IM given date fields will be blank) Date Medicare IM given:   Date Additional Medicare IM given:    Discharge Disposition:  [place]  Per UR Regulation:    If discussed at Long Length of Stay Meetings, dates discussed:    Comments:  05-17-13 Left message for Santiago Glad at [facility] updating her on [place] considering SNF . Magdalen Spatz RN BSN [phone]      05-17-13 Sandi from [facility] called back and stated if patient remains in hospital BCBS will not deny her stay  , and hospital social worker faxs her a FL2, she will talk to SNF's and try to  negotiate a price with a SNF . If Sandi [employer] ) can settle on a price with a SNF than patient can go to SNF at discharge. Dr Reynaldo Minium , Ricard Dillon PA , and patient and family aware. [place] with social doing FL2 . Magdalen Spatz RN BSN [phone]    05-17-13 Spoke with Carlyon Prows at [facility] confirmed no SNF benefit . Spoke with Santiago Glad at [facility] of [place] order and information faxed to [phone]       [place] [phone]    05-17-13 PT recommending SNF , left message with Nurse Case Manager [facility] K. @ [employer] : # [phone]  Magdalen Spatz RN BSN [phone]     05-16-13 Nurse  Case Manager Carlyon Prows K. @ [employer] : # [phone] from [place] called . Patient's husband called her asking if BCBS would pay for SNF placement . Patient does not have SNF benefit , therefore plan would have to be CIR or home with home health . Will await MRI and PT eval note. Benjiman Core PA aware of same . Magdalen Spatz RN BSN [phone]

## 2013-05-16 NOTE — Progress Notes (Signed)
Patient ID: [name], female   DOB: Dec 15, 1955, 57 y.o.   MRN: [id number]   Dr Rolena Infante reviewed the left hip MRI with the below findings:  IMPRESSION:  1. Edema tracks right obturator internus upper right adductor  musculature and along margins of the right piriformis muscle. This  could represent neurogenic edema or inflammation/injury of these  muscles. Please note that this is contralateral to the reported side  of the patient's pain.  2. Small fluid collection just above the right iliofemoral ligament,  possibly small ganglion cyst.  3. Low grade edema in the left gluteus medius muscle superiorly,  which could represent a muscle strain.  4. Extensive lumbar fusion.  5. Scattered small intramural fibroids in the uterus.   No fracture. Patient can start getting up with PT.  WBAT with walker.  We feel that she would benefit from inpatient rehab stay with extensive surgery that she had with T9-S1 fusion last week and current left hip pain with decreased mobility.  She has very limited assistance at home.

## 2013-05-16 NOTE — Progress Notes (Signed)
Thank you for consult on [name]. Chart reviewed and note that patient was discharged at min assist to supervision level on 05/13/13. She was admitted for left hip pain and constipation. Therapy and work up pending. Will monitor along and complete formal evaluation in am.

## 2013-05-16 NOTE — Progress Notes (Signed)
Subjective: Doing well.  Had a good BM yesterday.  Feeling better.  Going down to have MRI this morning.     Objective: Vital signs in last 24 hours: Temp:  [98.1 F (36.7 C)-98.6 F (37 C)] 98.1 F (36.7 C) (11/13 0536) Pulse Rate:  [89-110] 110 (11/13 0536) Resp:  [16-18] 18 (11/13 0536) BP: (137-146)/(81-88) 146/81 mmHg (11/13 0536) SpO2:  [97 %-100 %] 98 % (11/13 0536)  Intake/Output from previous day: 11/12 0701 - 11/13 0700 In: 240 [P.O.:240] Out: 3 [Urine:2; Stool:1] Intake/Output this shift: Total I/O In: 240 [P.O.:240] Out: -    Recent Labs  05/15/13 1830  HGB 9.8*    Recent Labs  05/15/13 1830  WBC 11.5*  RBC 3.10*  HCT 27.5*  PLT 286    Recent Labs  05/15/13 1830 05/16/13 0755  NA 137 137  K 2.8* 3.4*  CL 94* 95*  CO2 29 33*  BUN 14 13  CREATININE 0.63 0.64  GLUCOSE 100* 119*  CALCIUM 9.4 9.7   No results found for this basename: LABPT, INR,  in the last 72 hours  Exam:  Wound looks good.  steris intact.  No drainage or signs of infection.  Neurologically intact.    Assessment/Plan: Will await MRI results.  Will get inpt rehab consult.     Kienna Moncada M 05/16/2013, 10:28 AM

## 2013-05-16 NOTE — Consult Note (Signed)
Physical Medicine and Rehabilitation Consult  Reason for Consult: Recent T9-S1 fusion for spondylosis with stenosis and new onset of left hip pain. Referring Physician:  Dr.Brooks.   HPI: [name] is a 57 y.o. female with history of A fib, DM, recurrent UTIs,  LBP with radiculopathy BLE due to multilevel lumbar spondylosis with stenosis. She underwent L1-S1 decompression with T9-SI fusion on 05/08/13 and required intubation overnight. She was discharged to home on 05/13/14 at supervision level. She was readmitted on 05/15/13 with complaints of severe pain and difficulty weightbearing on left hip after hearing a pop.  Patient also with complaints of constipation and KUB without evidence of ileus. Noted to be hypokalemic with K-2.8 and was supplemented. MRI of hip with edema right obturator internus upper right adductor musculature and along margins of the right piriformis muscle as well as low grade edema in left gluteus medius muscle superiorly with question of muscle strain. PT/OT evaluations pending.  MD recommending rehab as patient with limited assist at home.      Review of Systems  Constitutional: Negative for fever and chills.  HENT: Negative for hearing loss.   Eyes: Negative for double vision.  Respiratory: Negative for cough.   Cardiovascular: Negative for chest pain.  Gastrointestinal: Negative for nausea and vomiting.  Musculoskeletal: Positive for back pain, joint pain and myalgias.  Skin: Negative for rash.  Neurological: Positive for focal weakness. Negative for headaches.   Past Medical History  Diagnosis Date  . PONV (postoperative nausea and vomiting)   . Dysrhythmia     atrial fib/dr [facility]  . Asthma   . Heart murmur   . Sepsis 05    kidney stone infection  . GERD (gastroesophageal reflux disease)   . Family history of anesthesia complication     "my mother also had PONV" (05/15/2013)  . High cholesterol   . CHF (congestive heart  failure)     "mild" (05/15/2013)  . Pneumonia     "used to be chronic; last time I had it was 2013" (05/15/2013)  . Chronic bronchitis   . Type II diabetes mellitus   . Tension headache   . Migraine     "haven't had one in the early 2000's" (05/15/2013)  . Arthritis     "back, knees, arms, wrists" (05/15/2013)  . Chronic lower back pain   . Kidney stones   . Recurrent UTI (urinary tract infection)     "from the continuous kidney stones; take Macrodantin qd" (05/15/2013)  . Breast cancer    Past Surgical History  Procedure Laterality Date  . Breast lumpectomy Left 02/2001  . Tubal ligation Bilateral 1985  . Bilateral oophorectomy Bilateral 2006    "cause I needed to 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" (05/15/2013)  . Lithotripsy      "2-3 times prior to 2002" (05/15/2013)  . Spinal fusion N/A 05/08/2013    Procedure: T9-S1 INSTRUMENTED FUSION T12 -S1 DECOMPRESSION;  Surgeon: Melina Schools, MD;  Location: [facility];  Service: Orthopedics;  Laterality: N/A;  . Breast biopsy Left 02/2001   History reviewed. No pertinent family history.   Social History:  reports that she has never smoked. She has never used smokeless tobacco. She reports that she does not drink alcohol or use illicit drugs.   Allergies  Allergen Reactions  . Other Nausea And Vomiting and Swelling    Shrimp if eat a lot  . Cleocin [Clindamycin Hcl] Other (See Comments)    "  irritated my esophagus"  . Morphine And Related Nausea And Vomiting  . Pneumococcal Vaccines Other (See Comments)    Really high fever, and flu symptoms. MD instructed not to give   Medications Prior to Admission  Medication Sig Dispense Refill  . albuterol (PROVENTIL HFA;VENTOLIN HFA) 108 (90 BASE) MCG/ACT inhaler Inhale 2 puffs into the lungs every 6 (six) hours as needed for wheezing or shortness of breath.      . bacitracin ointment Place 1 application into the nose 2 (two) times daily.      . carvedilol (COREG) 6.25 MG  tablet Take 6.25 mg by mouth 2 (two) times daily with a meal.       . docusate sodium (COLACE) 100 MG capsule Take 1 capsule (100 mg total) by mouth 3 (three) times daily as needed for mild constipation.  30 capsule  0  . flecainide (TAMBOCOR) 50 MG tablet Take 50 mg by mouth 2 (two) times daily.       . fluticasone (FLONASE) 50 MCG/ACT nasal spray Place 2 sprays into the nose daily as needed for rhinitis or allergies.      Marland Kitchen glipiZIDE (GLUCOTROL XL) 5 MG 24 hr tablet Take 5-10 mg by mouth 2 (two) times daily. 2 tablets every morning and 1 tablet every evening      . lisinopril-hydrochlorothiazide (PRINZIDE,ZESTORETIC) 10-12.5 MG per tablet Take 1 tablet by mouth daily.       Marland Kitchen loratadine (CLARITIN) 10 MG tablet Take 10 mg by mouth daily as needed for allergies.      . metFORMIN (GLUCOPHAGE) 1000 MG tablet Take 1,000 mg by mouth 2 (two) times daily with a meal.      . methocarbamol (ROBAXIN) 500 MG tablet Take 1 tablet (500 mg total) by mouth 3 (three) times daily as needed for muscle spasms.  60 tablet  0  . Multiple Vitamin (MULTIVITAMIN WITH MINERALS) TABS tablet Take 1 tablet by mouth daily.      . nitrofurantoin (MACRODANTIN) 50 MG capsule Take 50 mg by mouth every morning.       Marland Kitchen omeprazole (PRILOSEC) 20 MG capsule Take 20 mg by mouth.       . ondansetron (ZOFRAN) 4 MG tablet Take 1 tablet (4 mg total) by mouth every 8 (eight) hours as needed for nausea or vomiting.  20 tablet  0  . oxyCODONE-acetaminophen (PERCOCET) 10-325 MG per tablet Take 1 tablet by mouth every 4 (four) hours as needed for pain.  60 tablet  0  . polyethylene glycol (MIRALAX / GLYCOLAX) packet Take 17 g by mouth daily as needed for mild constipation.      . rosuvastatin (CRESTOR) 10 MG tablet Take 10 mg by mouth at bedtime.      . sitaGLIPtin (JANUVIA) 100 MG tablet Take 100 mg by mouth daily.      . vitamin C (ASCORBIC ACID) 500 MG tablet Take 500 mg by mouth daily as needed (for vitamin).      Alveda Reasons 20 MG TABS  tablet Take 20 mg by mouth daily.         Home: Home Living Family/patient expects to be discharged to:: Unsure Living Arrangements: Spouse/significant other;Children;Other relatives  Functional History:   Functional Status:  Mobility:          ADL:    Cognition: Cognition Orientation Level: Oriented X4    Blood pressure 146/81, pulse 110, temperature 98.1 F (36.7 C), temperature source Oral, resp. rate 18, SpO2 98.00%. Physical Exam  Constitutional: She is oriented to person, place, and time. She appears well-developed and well-nourished. No distress.  Eyes: Conjunctivae and EOM are normal. Pupils are equal, round, and reactive to light.  Neck: No JVD present. No tracheal deviation present. No thyromegaly present.  Cardiovascular: Normal rate.   Respiratory: Effort normal.  GI: She exhibits no distension. There is no tenderness.  Musculoskeletal:  Tenderness along left gluts and external hip rotators into interior-medial thigh. Has difficulty lifting the leg against gravity due to pain. Pain increases with ER and IR of leg.  Lymphadenopathy:    She has no cervical adenopathy.  Neurological: She is alert and oriented to person, place, and time. She has normal reflexes. No cranial nerve deficit. Coordination normal.  Subjective weakness in either leg with HF, KE. No focal weakness in foot. Sensation grossly intact.   Psychiatric: She has a normal mood and affect. Her behavior is normal. Judgment and thought content normal.    Results for orders placed during the hospital encounter of 05/15/13 (from the past 24 hour(s))  GLUCOSE, CAPILLARY     Status: Abnormal   Collection Time    05/15/13  4:55 PM      Result Value Range   Glucose-Capillary 100 (*) 70 - 99 mg/dL  COMPREHENSIVE METABOLIC PANEL     Status: Abnormal   Collection Time    05/15/13  6:30 PM      Result Value Range   Sodium 137  135 - 145 mEq/L   Potassium 2.8 (*) 3.5 - 5.1 mEq/L   Chloride 94 (*) 96 -  112 mEq/L   CO2 29  19 - 32 mEq/L   Glucose, Bld 100 (*) 70 - 99 mg/dL   BUN 14  6 - 23 mg/dL   Creatinine, Ser 0.63  0.50 - 1.10 mg/dL   Calcium 9.4  8.4 - 10.5 mg/dL   Total Protein 6.3  6.0 - 8.3 g/dL   Albumin 2.7 (*) 3.5 - 5.2 g/dL   AST 15  0 - 37 U/L   ALT 9  0 - 35 U/L   Alkaline Phosphatase 45  39 - 117 U/L   Total Bilirubin 0.3  0.3 - 1.2 mg/dL   GFR calc non Af Amer >90  >90 mL/min   GFR calc Af Amer >90  >90 mL/min  CBC     Status: Abnormal   Collection Time    05/15/13  6:30 PM      Result Value Range   WBC 11.5 (*) 4.0 - 10.5 K/uL   RBC 3.10 (*) 3.87 - 5.11 MIL/uL   Hemoglobin 9.8 (*) 12.0 - 15.0 g/dL   HCT 27.5 (*) 36.0 - 46.0 %   MCV 88.7  78.0 - 100.0 fL   MCH 31.6  26.0 - 34.0 pg   MCHC 35.6  30.0 - 36.0 g/dL   RDW 13.8  11.5 - 15.5 %   Platelets 286  150 - 400 K/uL  GLUCOSE, CAPILLARY     Status: Abnormal   Collection Time    05/16/13  5:20 AM      Result Value Range   Glucose-Capillary 165 (*) 70 - 99 mg/dL   Comment 1 Notify RN     Comment 2 Documented in Chart    GLUCOSE, CAPILLARY     Status: Abnormal   Collection Time    05/16/13  7:39 AM      Result Value Range   Glucose-Capillary 124 (*) 70 - 99 mg/dL  Comment 1 Notify RN    BASIC METABOLIC PANEL     Status: Abnormal   Collection Time    05/16/13  7:55 AM      Result Value Range   Sodium 137  135 - 145 mEq/L   Potassium 3.4 (*) 3.5 - 5.1 mEq/L   Chloride 95 (*) 96 - 112 mEq/L   CO2 33 (*) 19 - 32 mEq/L   Glucose, Bld 119 (*) 70 - 99 mg/dL   BUN 13  6 - 23 mg/dL   Creatinine, Ser 0.64  0.50 - 1.10 mg/dL   Calcium 9.7  8.4 - 10.5 mg/dL   GFR calc non Af Amer >90  >90 mL/min   GFR calc Af Amer >90  >90 mL/min   Dg Abd 1 View  05/16/2013   CLINICAL DATA:  No bowel movements for 1 week; evaluate for ileus.  EXAM: ABDOMEN - 1 VIEW  COMPARISON:  CT of the abdomen and pelvis performed 07/12/2012  FINDINGS: The visualized bowel gas pattern is grossly unremarkable. Scattered stool and air are  seen within the colon; there is no evidence of bowel dilatation to suggest ileus. The visualized stool burden is within normal limits. Scattered air is noted within the small bowel, normal in appearance. No free intra-abdominal air is identified, though evaluation for free air is suboptimal on a single supine view.  No acute osseous abnormalities are seen. Thoracolumbar spinal fusion hardware is partially imaged.  IMPRESSION: Unremarkable bowel gas pattern; no evidence for ileus. No free intra-abdominal air seen.   Electronically Signed   By: Garald Balding M.D.   On: 05/16/2013 02:04    Assessment/Plan: Diagnosis: hx of L1-S1 fusion. Strains of left hip external and internal rotators 1. Does the need for close, 24 hr/day medical supervision in concert with the patient's rehab needs make it unreasonable for this patient to be served in a less intensive setting? Potentially 2. Co-Morbidities requiring supervision/potential complications: htn, pain 3. Due to bladder management, bowel management, safety, skin/wound care, disease management, medication administration, pain management and patient education, does the patient require 24 hr/day rehab nursing? Potentially 4. Does the patient require coordinated care of a physician, rehab nurse, PT, OT to address physical and functional deficits in the context of the above medical diagnosis(es)? Potentially Addressing deficits in the following areas: balance, endurance, locomotion, strength, transferring, bowel/bladder control, bathing, dressing, feeding, grooming and toileting 5. Can the patient actively participate in an intensive therapy program of at least 3 hrs of therapy per day at least 5 days per week? Potentially 6. The potential for patient to make measurable gains while on inpatient rehab is TBD 7. Anticipated functional outcomes upon discharge from inpatient rehab are ?mod I with PT, ?mod I with OT, n/a with SLP. 8. Estimated rehab length of stay to  reach the above functional goals is: TBD 9. Does the patient have adequate social supports to accommodate these discharge functional goals? Potentially 10. Anticipated D/C setting: Home 11. Anticipated post D/C treatments: [place] therapy 12. Overall Rehab/Functional Prognosis: excellent  RECOMMENDATIONS: This patient's condition is appropriate for continued rehabilitative care in the following setting: [facility] Patient has agreed to participate in recommended program. Potentially Note that insurance prior authorization may be required for reimbursement for recommended care.  Comment: [name] needs aggressive pain mgt. I would recommend robaxin 750mg  -1000mg  q6 scheduled for muscle spasm/strain to start. Will follow for progress. I don't anticipate that she will need an inpatient rehab stay.  Rehab RN to  follow up.   Meredith Staggers, MD, [facility]     05/16/2013

## 2013-05-16 NOTE — H&P (Signed)
[name] is an 57 y.o. female.   Chief Complaint:  Constipation, left hip pain, immobility, s/p T9-S1 fusion HPI:  57 yo wf presented to our office 15 May 2013 with the above complaint.  D/c home from the hospital 10 nov after undergoing T9-S1 fusion.   Stated that she went to stand up yesterday and felt a "pop" in her left hip.  Severe pain and difficulty weightbearing.  Also has not had a BM since last Tuesday.  Denied N/V, abd pain.    Denies complaints of incontinence.    Past Medical History  Diagnosis Date  . PONV (postoperative nausea and vomiting)   . Dysrhythmia     atrial fib/dr [facility]  . Asthma   . Heart murmur   . Sepsis 05    kidney stone infection  . GERD (gastroesophageal reflux disease)   . Family history of anesthesia complication     "my mother also had PONV" (05/15/2013)  . High cholesterol   . CHF (congestive heart failure)     "mild" (05/15/2013)  . Pneumonia     "used to be chronic; last time I had it was 2013" (05/15/2013)  . Chronic bronchitis   . Type II diabetes mellitus   . Tension headache   . Migraine     "haven't had one in the early 2000's" (05/15/2013)  . Arthritis     "back, knees, arms, wrists" (05/15/2013)  . Chronic lower back pain   . Kidney stones   . Recurrent UTI (urinary tract infection)     "from the continuous kidney stones; take Macrodantin qd" (05/15/2013)  . Breast cancer     Past Surgical History  Procedure Laterality Date  . Breast lumpectomy Left 02/2001  . Tubal ligation Bilateral 1985  . Bilateral oophorectomy Bilateral 2006    "cause I needed to 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" (05/15/2013)  . Lithotripsy      "2-3 times prior to 2002" (05/15/2013)  . Spinal fusion N/A 05/08/2013    Procedure: T9-S1 INSTRUMENTED FUSION T12 -S1 DECOMPRESSION;  Surgeon: Melina Schools, MD;  Location: [facility];  Service: Orthopedics;  Laterality: N/A;  . Breast biopsy Left 02/2001    History  reviewed. No pertinent family history. Social History:  reports that she has never smoked. She has never used smokeless tobacco. She reports that she does not drink alcohol or use illicit drugs.  Allergies:  Allergies  Allergen Reactions  . Other Nausea And Vomiting and Swelling    Shrimp if eat a lot  . Cleocin [Clindamycin Hcl] Other (See Comments)    "irritated my esophagus"  . Morphine And Related Nausea And Vomiting  . Pneumococcal Vaccines Other (See Comments)    Really high fever, and flu symptoms. MD instructed not to give    Medications Prior to Admission  Medication Sig Dispense Refill  . albuterol (PROVENTIL HFA;VENTOLIN HFA) 108 (90 BASE) MCG/ACT inhaler Inhale 2 puffs into the lungs every 6 (six) hours as needed for wheezing or shortness of breath.      . bacitracin ointment Place 1 application into the nose 2 (two) times daily.      . carvedilol (COREG) 6.25 MG tablet Take 6.25 mg by mouth 2 (two) times daily with a meal.       . docusate sodium (COLACE) 100 MG capsule Take 1 capsule (100 mg total) by mouth 3 (three) times daily as needed for mild constipation.  30 capsule  0  . flecainide (TAMBOCOR) 50 MG tablet Take 50 mg by mouth 2 (two) times daily.       . fluticasone (FLONASE) 50 MCG/ACT nasal spray Place 2 sprays into the nose daily as needed for rhinitis or allergies.      Marland Kitchen glipiZIDE (GLUCOTROL XL) 5 MG 24 hr tablet Take 5-10 mg by mouth 2 (two) times daily. 2 tablets every morning and 1 tablet every evening      . lisinopril-hydrochlorothiazide (PRINZIDE,ZESTORETIC) 10-12.5 MG per tablet Take 1 tablet by mouth daily.       Marland Kitchen loratadine (CLARITIN) 10 MG tablet Take 10 mg by mouth daily as needed for allergies.      . metFORMIN (GLUCOPHAGE) 1000 MG tablet Take 1,000 mg by mouth 2 (two) times daily with a meal.      . methocarbamol (ROBAXIN) 500 MG tablet Take 1 tablet (500 mg total) by mouth 3 (three) times daily as needed for muscle spasms.  60 tablet  0  . Multiple  Vitamin (MULTIVITAMIN WITH MINERALS) TABS tablet Take 1 tablet by mouth daily.      . nitrofurantoin (MACRODANTIN) 50 MG capsule Take 50 mg by mouth every morning.       Marland Kitchen omeprazole (PRILOSEC) 20 MG capsule Take 20 mg by mouth.       . ondansetron (ZOFRAN) 4 MG tablet Take 1 tablet (4 mg total) by mouth every 8 (eight) hours as needed for nausea or vomiting.  20 tablet  0  . oxyCODONE-acetaminophen (PERCOCET) 10-325 MG per tablet Take 1 tablet by mouth every 4 (four) hours as needed for pain.  60 tablet  0  . polyethylene glycol (MIRALAX / GLYCOLAX) packet Take 17 g by mouth daily as needed for mild constipation.      . rosuvastatin (CRESTOR) 10 MG tablet Take 10 mg by mouth at bedtime.      . sitaGLIPtin (JANUVIA) 100 MG tablet Take 100 mg by mouth daily.      . vitamin C (ASCORBIC ACID) 500 MG tablet Take 500 mg by mouth daily as needed (for vitamin).      Alveda Reasons 20 MG TABS tablet Take 20 mg by mouth daily.         Results for orders placed during the hospital encounter of 05/15/13 (from the past 48 hour(s))  GLUCOSE, CAPILLARY     Status: Abnormal   Collection Time    05/15/13  4:55 PM      Result Value Range   Glucose-Capillary 100 (*) 70 - 99 mg/dL  COMPREHENSIVE METABOLIC PANEL     Status: Abnormal   Collection Time    05/15/13  6:30 PM      Result Value Range   Sodium 137  135 - 145 mEq/L   Potassium 2.8 (*) 3.5 - 5.1 mEq/L   Chloride 94 (*) 96 - 112 mEq/L   CO2 29  19 - 32 mEq/L   Glucose, Bld 100 (*) 70 - 99 mg/dL   BUN 14  6 - 23 mg/dL   Creatinine, Ser 0.63  0.50 - 1.10 mg/dL   Calcium 9.4  8.4 - 10.5 mg/dL   Total Protein 6.3  6.0 - 8.3 g/dL   Albumin 2.7 (*) 3.5 - 5.2 g/dL   AST 15  0 - 37 U/L   ALT 9  0 - 35 U/L   Alkaline Phosphatase 45  39 - 117 U/L   Total Bilirubin 0.3  0.3 - 1.2 mg/dL   GFR  calc non Af Amer >90  >90 mL/min   GFR calc Af Amer >90  >90 mL/min   Comment: (NOTE)     The eGFR has been calculated using the CKD EPI equation.     This  calculation has not been validated in all clinical situations.     eGFR's persistently <90 mL/min signify possible Chronic Kidney     Disease.  CBC     Status: Abnormal   Collection Time    05/15/13  6:30 PM      Result Value Range   WBC 11.5 (*) 4.0 - 10.5 K/uL   RBC 3.10 (*) 3.87 - 5.11 MIL/uL   Hemoglobin 9.8 (*) 12.0 - 15.0 g/dL   HCT 27.5 (*) 36.0 - 46.0 %   MCV 88.7  78.0 - 100.0 fL   MCH 31.6  26.0 - 34.0 pg   MCHC 35.6  30.0 - 36.0 g/dL   RDW 13.8  11.5 - 15.5 %   Platelets 286  150 - 400 K/uL  GLUCOSE, CAPILLARY     Status: Abnormal   Collection Time    05/16/13  5:20 AM      Result Value Range   Glucose-Capillary 165 (*) 70 - 99 mg/dL   Comment 1 Notify RN     Comment 2 Documented in Chart    GLUCOSE, CAPILLARY     Status: Abnormal   Collection Time    05/16/13  7:39 AM      Result Value Range   Glucose-Capillary 124 (*) 70 - 99 mg/dL   Comment 1 Notify RN    BASIC METABOLIC PANEL     Status: Abnormal   Collection Time    05/16/13  7:55 AM      Result Value Range   Sodium 137  135 - 145 mEq/L   Potassium 3.4 (*) 3.5 - 5.1 mEq/L   Chloride 95 (*) 96 - 112 mEq/L   CO2 33 (*) 19 - 32 mEq/L   Glucose, Bld 119 (*) 70 - 99 mg/dL   BUN 13  6 - 23 mg/dL   Creatinine, Ser 0.64  0.50 - 1.10 mg/dL   Calcium 9.7  8.4 - 10.5 mg/dL   GFR calc non Af Amer >90  >90 mL/min   GFR calc Af Amer >90  >90 mL/min   Comment: (NOTE)     The eGFR has been calculated using the CKD EPI equation.     This calculation has not been validated in all clinical situations.     eGFR's persistently <90 mL/min signify possible Chronic Kidney     Disease.   Dg Abd 1 View  05/16/2013   CLINICAL DATA:  No bowel movements for 1 week; evaluate for ileus.  EXAM: ABDOMEN - 1 VIEW  COMPARISON:  CT of the abdomen and pelvis performed 07/12/2012  FINDINGS: The visualized bowel gas pattern is grossly unremarkable. Scattered stool and air are seen within the colon; there is no evidence of bowel dilatation  to suggest ileus. The visualized stool burden is within normal limits. Scattered air is noted within the small bowel, normal in appearance. No free intra-abdominal air is identified, though evaluation for free air is suboptimal on a single supine view.  No acute osseous abnormalities are seen. Thoracolumbar spinal fusion hardware is partially imaged.  IMPRESSION: Unremarkable bowel gas pattern; no evidence for ileus. No free intra-abdominal air seen.   Electronically Signed   By: Garald Balding M.D.   On: 05/16/2013 02:04  Review of Systems  Constitutional: Negative.   Eyes: Negative.   Respiratory: Negative.   Cardiovascular: Negative.   Gastrointestinal: Positive for constipation. Negative for nausea, vomiting, abdominal pain, diarrhea, blood in stool and melena.  Genitourinary: Negative.   Musculoskeletal: Positive for back pain.  Psychiatric/Behavioral: Negative.     Blood pressure 146/81, pulse 110, temperature 98.1 F (36.7 C), temperature source Oral, resp. rate 18, SpO2 98.00%. Physical Exam  Constitutional: She is oriented to person, place, and time. She appears well-developed.  HENT:  Head: Normocephalic and atraumatic.  Eyes: EOM are normal. Pupils are equal, round, and reactive to light.  Neck: Normal range of motion.  Cardiovascular: Normal rate.   Respiratory: Effort normal. No respiratory distress.  GI: Soft.  Musculoskeletal:  Thoracolumbar wound looks good.  No drainage or signs of infection.  Left hip, some pain with log roll.  Neurologically intact.  bilat calves nontender.    Neurological: She is alert and oriented to person, place, and time.  Skin: Skin is warm and dry.  Psychiatric: She has a normal mood and affect. Her behavior is normal. Judgment and thought content normal.     Assessment: Constipation S/p T9-S1 fusion Left hip pain.  Question spur avulsion off superior greater trochanter seen on xray at the office   Plan: Admit to unit.  MRI left hip.  R/o fracture.  Will get kub and treat constipation.    Aleese Kamps M 05/16/2013, 10:13 AM

## 2013-05-17 DIAGNOSIS — M47817 Spondylosis without myelopathy or radiculopathy, lumbosacral region: Secondary | ICD-10-CM

## 2013-05-17 DIAGNOSIS — IMO0002 Reserved for concepts with insufficient information to code with codable children: Secondary | ICD-10-CM

## 2013-05-17 DIAGNOSIS — M799 Soft tissue disorder, unspecified: Secondary | ICD-10-CM

## 2013-05-17 LAB — GLUCOSE, CAPILLARY: Glucose-Capillary: 103 mg/dL — ABNORMAL HIGH (ref 70–99)

## 2013-05-17 MED ORDER — METHOCARBAMOL 500 MG PO TABS: 500.0000 mg | ORAL_TABLET | Freq: Four times a day (QID) | ORAL | Status: DC | PRN

## 2013-05-17 MED ORDER — DOCUSATE SODIUM 100 MG PO CAPS: 100.0000 mg | ORAL_CAPSULE | Freq: Two times a day (BID) | ORAL | Status: DC

## 2013-05-17 MED ORDER — OXYCODONE-ACETAMINOPHEN 5-325 MG PO TABS: 1.0000 | ORAL_TABLET | ORAL | Status: DC | PRN

## 2013-05-17 MED ORDER — ASPIRIN 81 MG PO CHEW: 81.0000 mg | CHEWABLE_TABLET | Freq: Every day | ORAL | Status: DC

## 2013-05-17 MED ORDER — ONDANSETRON HCL 4 MG PO TABS: 4.0000 mg | ORAL_TABLET | Freq: Three times a day (TID) | ORAL | Status: DC | PRN

## 2013-05-17 NOTE — Progress Notes (Signed)
Report given to Engineer, materials at [employer]. Alba Cory RN

## 2013-05-17 NOTE — Progress Notes (Signed)
Received call from RN CM. Noted PT and OT assessments were from previous admission on 10/9 and 10/8 respectively. I await therapy reevaluations to assess most appropriate rehab venue options. [employer] would have to approve any rehab venue which includes inpt rehab stay. I will follow up after therapy reevaluations. Please call me with any questions. [phone]

## 2013-05-17 NOTE — Clinical Social Work Placement (Signed)
Clinical Social Work Department CLINICAL SOCIAL WORK PLACEMENT NOTE 05/17/2013  Patient:  [name], [name]  Account Number:  [id number] Admit date:  05/15/2013  Clinical Social Worker:  Kemper Durie, [place]  Date/time:  05/17/2013 04:00 PM  Clinical Social Work is seeking post-discharge placement for this patient at the following level of care:   [place]   (*CSW will update this form in Epic as items are completed)   05/17/2013  Patient/family provided with [employer] Department of Clinical Social Work's list of facilities offering this level of care within the geographic area requested by the patient (or if unable, by the patient's family).  05/17/2013  Patient/family informed of their freedom to choose among providers that offer the needed level of care, that participate in Medicare, Medicaid or managed care program needed by the patient, have an available bed and are willing to accept the patient.  05/17/2013  Patient/family informed of [facility]' ownership interest in [facility], as well as of the fact that they are under no obligation to receive care at this facility.  PASARR submitted to EDS on 05/17/2013 PASARR number received from EDS on 05/17/2013  FL2 transmitted to all facilities in geographic area requested by pt/family on  05/17/2013 FL2 transmitted to all facilities within larger geographic area on   Patient informed that his/her managed care company has contracts with or will negotiate with  certain facilities, including the following:     Patient/family informed of bed offers received:  05/17/2013 Patient chooses bed at [facility] Physician recommends and patient chooses bed at    Patient to be transferred to [facility] on  05/17/2013 Patient to be transferred to facility by Ambulance  The following physician request were entered in Epic:   Additional Comments: Per MD patient ready for DC to  [place], patient, and facility notified of DC. RN given number for report. Ambulance requested for transport of patient. DC packet left with chart. CSW signing off.   Liz Beach, [place], [place], JI:[id number]

## 2013-05-17 NOTE — Clinical Social Work Psychosocial (Signed)
Clinical Social Work Department BRIEF PSYCHOSOCIAL ASSESSMENT 05/17/2013  Patient:  [name], [name]     Account Number:  [id number]     Admit date:  05/15/2013  Clinical Social Worker:  Lovey Newcomer  Date/Time:  05/17/2013 03:45 PM  Referred by:  Physician  Date Referred:  05/17/2013 Referred for  SNF Placement   Other Referral:   Interview type:  Patient Other interview type:   Patient alert and oriented at time of assessment.    PSYCHOSOCIAL DATA Living Status:  HUSBAND Admitted from facility:   Level of care:   Primary support name:  Dierdre Calton [phone] Primary support relationship to patient:   Degree of support available:   Support is good. Patient has several family members at bedside.    CURRENT CONCERNS Current Concerns  Post-Acute Placement   Other Concerns:    SOCIAL WORK ASSESSMENT / PLAN CSW met with patient to discuss recommendation for SNF placement. Patient voiced her frustration with the disorganized manner her disposition has been handled. Patient is agreeable to SNF placement. Patient is from home with husband. Patient has no past SNF experience. Patient is requesting ambulance transport to SNF. CSW explained that patient would have limited SNF options given her insurance. Patient understands and is willing to take first available bed.   Assessment/plan status:  Psychosocial Support/Ongoing Assessment of Needs Other assessment/ plan:   Complete FL2, Fax, PASRR, BCBS auth.   Information/referral to community resources:   CSW contact information given to patient.    PATIENT'S/FAMILY'S RESPONSE TO PLAN OF CARE: Patient is agreeable to SNF placement. Patient was pleasant and appreciative of CSW contact. CSW will continue to support patient in DC process.       Liz Beach, [place], [place], JI:[id number]

## 2013-05-17 NOTE — Discharge Summary (Signed)
Physician Discharge Summary  Patient ID: [name] MRN: [id number] DOB/AGE: [birth year] 57 y.o.  Admit date: 05/15/2013 Discharge date: 05/17/2013  Admission Diagnoses:  Left hip pain                                           S/p T9-S1 fusion                                          constipation  Discharge Diagnoses:  Left gluteus medius strain S/p T9-S1 fusion   Discharged Condition: stable  Hospital Course: 57 yo wf was readmitted to the hospital 12 nov with complaints of left hip pain, constipation.  S/p T9-S1 fusion last week.  Was discharged home Monday and Tuesday she was getting up from a chair and felt something "pop" in her hip.  Severe pain and had increased difficulty with weightbearing.  Stated that she has also been constipated x 1 week.  Admitted for evaluation and treatment. Slow moving with PT due to extensive back surgery and left hip pain.  Wound looked good.  No signs of infection.  SNF bed offered today and patient stable for transfer.     Discharge Exam: Blood pressure 156/86, pulse 99, temperature 99.2 F (37.3 C), temperature source Oral, resp. rate 20, SpO2 98.00%.   Disposition: 01-Home or Self Care  Discharge Orders   Future Appointments Provider Department Dept Phone   04/18/2014 10:00 AM Gwendolyn [place] [phone]   04/18/2014 10:30 AM [name], MD [place] [phone]   Future Orders Complete By Expires   Call MD / Call 911  As directed    Comments:     If you experience chest pain or shortness of breath, CALL 911 and be transported to the hospital emergency room.  If you develope a fever above 101 F, pus (white drainage) or increased drainage or redness at the wound, or calf pain, call your surgeon's office.   Constipation Prevention  As directed    Comments:     Drink plenty of fluids.  Prune juice may be helpful.  You may use a stool softener, such as Colace  (over the counter) 100 mg twice a day.  Use MiraLax (over the counter) for constipation as needed.   Discharge instructions  As directed    Comments:     Ok to shower 5 days postop.  Do not apply any creams or ointments to incision.  Do not remove steri-strips.  Can use 4x4 gauze and tape for dressing changes.  No aggressive activity.  No bending, squatting or prolonged sitting.  Mostly be in reclined position or lying down.  Wear brace.   Driving restrictions  As directed    Comments:     No driving until further notice.No driving until further notice.   Increase activity slowly as tolerated  As directed    Lifting restrictions  As directed    Comments:     No lifting until further notice.       Medication List    STOP taking these medications       bacitracin ointment     multivitamin with minerals Tabs tablet     nitrofurantoin 50 MG  capsule  Commonly known as:  MACRODANTIN     oxyCODONE-acetaminophen 10-325 MG per tablet  Commonly known as:  PERCOCET  Replaced by:  oxyCODONE-acetaminophen 5-325 MG per tablet      TAKE these medications       albuterol 108 (90 BASE) MCG/ACT inhaler  Commonly known as:  PROVENTIL HFA;VENTOLIN HFA  Inhale 2 puffs into the lungs every 6 (six) hours as needed for wheezing or shortness of breath.     carvedilol 6.25 MG tablet  Commonly known as:  COREG  Take 6.25 mg by mouth 2 (two) times daily with a meal.     docusate sodium 100 MG capsule  Commonly known as:  COLACE  Take 1 capsule (100 mg total) by mouth 2 (two) times daily.     flecainide 50 MG tablet  Commonly known as:  TAMBOCOR  Take 50 mg by mouth 2 (two) times daily.     fluticasone 50 MCG/ACT nasal spray  Commonly known as:  FLONASE  Place 2 sprays into the nose daily as needed for rhinitis or allergies.     glipiZIDE 5 MG 24 hr tablet  Commonly known as:  GLUCOTROL XL  Take 5-10 mg by mouth 2 (two) times daily. 2 tablets every morning and 1 tablet every evening      lisinopril-hydrochlorothiazide 10-12.5 MG per tablet  Commonly known as:  PRINZIDE,ZESTORETIC  Take 1 tablet by mouth daily.     loratadine 10 MG tablet  Commonly known as:  CLARITIN  Take 10 mg by mouth daily as needed for allergies.     metFORMIN 1000 MG tablet  Commonly known as:  GLUCOPHAGE  Take 1,000 mg by mouth 2 (two) times daily with a meal.     methocarbamol 500 MG tablet  Commonly known as:  ROBAXIN  Take 1 tablet (500 mg total) by mouth every 6 (six) hours as needed for muscle spasms.     omeprazole 20 MG capsule  Commonly known as:  PRILOSEC  Take 20 mg by mouth.     ondansetron 4 MG tablet  Commonly known as:  ZOFRAN  Take 1 tablet (4 mg total) by mouth every 8 (eight) hours as needed for nausea or vomiting.     oxyCODONE-acetaminophen 5-325 MG per tablet  Commonly known as:  PERCOCET/ROXICET  Take 1-2 tablets by mouth every 4 (four) hours as needed for severe pain.     polyethylene glycol packet  Commonly known as:  MIRALAX / GLYCOLAX  Take 17 g by mouth daily as needed for mild constipation.     rosuvastatin 10 MG tablet  Commonly known as:  CRESTOR  Take 10 mg by mouth at bedtime.     sitaGLIPtin 100 MG tablet  Commonly known as:  JANUVIA  Take 100 mg by mouth daily.     vitamin C 500 MG tablet  Commonly known as:  ASCORBIC ACID  Take 500 mg by mouth daily as needed (for vitamin).     XARELTO 20 MG Tabs tablet  Generic drug:  Rivaroxaban  Take 20 mg by mouth daily.         SignedLanae Crumbly 05/17/2013, 4:04 PM

## 2013-05-17 NOTE — Progress Notes (Signed)
Patient ID: [name], female   DOB: [birth year], 57 y.o.   MRN: [id number]   Dr Rolena Infante was advised that if Mrs Stoffers is admitted past today that she would be financial responsible.  We had mentioned that she would benefit from a short rehab stay.  Will plan to d/c home today with home health PT.  She is still having difficulty with ambulating from the extensive surgery that she had and hip pain is worsening gait issue.  She will f/u with dr Rolena Infante in one week.

## 2013-05-17 NOTE — Evaluation (Signed)
Physical Therapy Evaluation Patient Details Name: [name] MRN: [id number] DOB: April 12, 1956 Today's Date: 05/17/2013 Time: 1135-1205 PT Time Calculation (min): 30 min  PT Assessment / Plan / Recommendation History of Present Illness  Patient is a 57 y/o female readmitted 05/15/13 after recent T9-S1 fusion on 05/08/13.  Admitted for constipation, left hip pain and immobility.  Clinical Impression  Patient presents with significant left LE weakness and pain in bilateral hips left more than right.  She is requiring more assist than during previous hospitalization and indicates she went downhill ever since her discharge.  Feel she may benefit from SNF stay for more frequent therapy and nursing assist for pain management prior to d/c home.  States [facility] is near her home and would like to pursue it if can be covered by insurance.  Do not feel she can tolerate intensity of rehab on CIR at this time. If she is to d/c home would recommend bathing aide and hospital bed.     PT Assessment  Patient needs continued PT services    Follow Up Recommendations  SNF    Does the patient have the potential to tolerate intense rehabilitation    No  Barriers to Discharge  Pain, immobility      Equipment Recommendations  None recommended by PT    Recommendations for Other Services   None  Frequency Min 5X/week    Precautions / Restrictions Precautions Precautions: Back;Fall Required Braces or Orthoses: Spinal Brace Spinal Brace: Lumbar corset;Applied in sitting position   Pertinent Vitals/Pain 7/10 at rest, 9/10 with ambulation in hips left > right      Mobility  Bed Mobility Bed Mobility: Right Sidelying to Sit;Sit to Sidelying Right Rolling Right: With rail;5: Supervision Right Sidelying to Sit: 5: Supervision;With rails;HOB flat Sit to Sidelying Right: HOB flat;With rail;3: Mod assist Details for Bed Mobility Assistance: lifted legs for pt due to pain and  weakness Transfers Sit to Stand: 3: Mod assist;With upper extremity assist;From bed;From chair/3-in-1 Stand to Sit: 4: Min assist;To chair/3-in-1;To bed;With upper extremity assist Details for Transfer Assistance: cues for safety with technique and lifting assist due to pain and LE weakness Ambulation/Gait Ambulation/Gait Assistance: 4: Min guard Ambulation Distance (Feet): 15 Feet (x 2) Assistive device: Rolling walker Ambulation/Gait Assistance Details: increased UE use and effortful with pain throughout.  Fatigued after ambulation in room only Gait Pattern: Antalgic;Shuffle Stairs: No        PT Diagnosis: Difficulty walking;Acute pain  PT Problem List: Decreased strength;Pain;Decreased mobility;Decreased activity tolerance;Decreased safety awareness PT Treatment Interventions: DME instruction;Balance training;Gait training;Stair training;Functional mobility training;Patient/family education;Therapeutic activities;Therapeutic exercise     PT Goals(Current goals can be found in the care plan section) Acute Rehab PT Goals Patient Stated Goal: To get rehab prior to d/c home PT Goal Formulation: With patient Time For Goal Achievement: 05/24/13 Potential to Achieve Goals: Good  Visit Information  Last PT Received On: 05/17/13 Assistance Needed: +1 History of Present Illness: Patient is a 57 y/o female readmitted 05/15/13 after recent T9-S1 fusion on 05/08/13.  Admitted for constipation, left hip pain and immobility.       Prior Functioning  Home Living Living Arrangements: Spouse/significant other;Children;Other relatives Available Help at Discharge: Family;Available 24 hours/day Type of Home: House Home Access: Stairs to enter;Ramped entrance Entrance Stairs-Number of Steps: 2 Home Layout: One level Home Equipment: Walker - 4 wheels;Shower seat Prior Function Level of Independence: Independent with assistive device(s) Comments: independent prior to surgery, but needed assist  since and great  difficulty mobilizing due to severe pain Communication Communication: No difficulties    Cognition  Cognition Arousal/Alertness: Awake/alert Behavior During Therapy: WFL for tasks assessed/performed Overall Cognitive Status: Within Functional Limits for tasks assessed    Extremity/Trunk Assessment Lower Extremity Assessment Lower Extremity Assessment: RLE deficits/detail;LLE deficits/detail RLE Deficits / Details: AAROM WFL, pain with hip flexion so not tested, knee extension strength 4/5 LLE Deficits / Details: AAROM WFL and painful, hip flexion not tested due to pain, knee extension 3+/5 LLE: Unable to fully assess due to pain   Balance Static Standing Balance Static Standing - Balance Support: Bilateral upper extremity supported Static Standing - Level of Assistance: 4: Min assist  End of Session PT - End of Session Equipment Utilized During Treatment: Back brace Activity Tolerance: Patient limited by fatigue;Patient limited by pain Patient left: in bed;with family/visitor present;with call bell/phone within reach  GP Functional Assessment Tool Used: Clinical Judgement Functional Limitation: Mobility: Walking and moving around Mobility: Walking and Moving Around Current Status [id number]): At least 40 percent but less than 60 percent impaired, limited or restricted Mobility: Walking and Moving Around Goal Status [phone]): At least 1 percent but less than 20 percent impaired, limited or restricted   [facility] 05/17/2013, 12:12 PM [place], [place] 05/17/2013

## 2013-05-17 NOTE — Discharge Summary (Signed)
Agree with above 

## 2013-05-17 NOTE — Progress Notes (Signed)
Noted Pt reevaluation. [place] will not approve an inpt rehab admission with noted inability to tolerate more intense therapies at this time per their evaluation. [id number]

## 2013-05-17 NOTE — Progress Notes (Signed)
    Subjective:      Patient reports pain as 5 on 0-10 scale.  Reports left gluteal leg pain Reports minimal incisional back pain   Positive void Positive bowel movement Positive flatus Negative chest pain or shortness of breath  Objective: Vital signs in last 24 hours: Temp:  [98.5 F (36.9 C)-98.8 F (37.1 C)] 98.5 F (36.9 C) (11/14 0621) Pulse Rate:  [88-105] 88 (11/14 0621) Resp:  [18] 18 (11/14 0621) BP: (122-140)/(67-82) 137/82 mmHg (11/14 0621) SpO2:  [96 %-98 %] 96 % (11/14 0621)  Intake/Output from previous day: 11/13 0701 - 11/14 0700 In: 480 [P.O.:480] Out: -   Labs:  Recent Labs  05/15/13 1830  WBC 11.5*  RBC 3.10*  HCT 27.5*  PLT 286    Recent Labs  05/15/13 1830 05/16/13 0755  NA 137 137  K 2.8* 3.4*  CL 94* 95*  CO2 29 33*  BUN 14 13  CREATININE 0.63 0.64  GLUCOSE 100* 119*  CALCIUM 9.4 9.7   No results found for this basename: LABPT, INR,  in the last 72 hours  Physical Exam: Neurologically intact ABD soft Neurovascular intact Incision: dressing C/D/I and no drainage Compartment soft  Assessment/Plan: Patient stable  xrays n/a Continue mobilization with physical therapy Continue care  Up with therapy Awaiting rehab consult Insurance will not cover SNF - patient not safe for home d/c.  Will need to stay in hospital until suitable d/c arrangements can be made.  Melina Schools, MD [place] [phone]

## 2013-06-17 NOTE — H&P (Signed)
Agree with above 

## 2013-07-08 ENCOUNTER — Other Ambulatory Visit: Payer: Self-pay | Admitting: Hematology & Oncology

## 2013-07-08 ENCOUNTER — Telehealth: Payer: Self-pay | Admitting: Hematology & Oncology

## 2013-07-08 ENCOUNTER — Telehealth: Payer: Self-pay | Admitting: *Deleted

## 2013-07-08 DIAGNOSIS — N63 Unspecified lump in unspecified breast: Secondary | ICD-10-CM

## 2013-07-08 DIAGNOSIS — C50912 Malignant neoplasm of unspecified site of left female breast: Secondary | ICD-10-CM

## 2013-07-08 NOTE — Telephone Encounter (Signed)
Pt aware of 1-5 mm and [place] add on. MD signed off on orders

## 2013-07-08 NOTE — Telephone Encounter (Signed)
Pt called stating she has developed a new left breast mass about the size of a pecan. She saw her PCP who did a clinical breast exam and recommended f/u with Dr Marin Olp. She completed treatment about 12 years ago. Reviewed with Dr Marin Olp. To have a diagnostic mammogram of her left breast. Request sent to scheduling to have done at the [facility] in [place].

## 2013-07-09 ENCOUNTER — Ambulatory Visit
Admission: RE | Admit: 2013-07-09 | Discharge: 2013-07-09 | Disposition: A | Discharge status: Home / Self Care | Payer: Federal, State, Local not specified - PPO | Source: Ambulatory Visit | Attending: Hematology & Oncology | Admitting: Hematology & Oncology

## 2013-07-09 DIAGNOSIS — N63 Unspecified lump in unspecified breast: Secondary | ICD-10-CM

## 2013-07-09 DIAGNOSIS — C50912 Malignant neoplasm of unspecified site of left female breast: Secondary | ICD-10-CM

## 2013-07-19 ENCOUNTER — Ambulatory Visit (INDEPENDENT_AMBULATORY_CARE_PROVIDER_SITE_OTHER): Payer: Federal, State, Local not specified - PPO | Admitting: General Surgery

## 2013-07-19 ENCOUNTER — Encounter (INDEPENDENT_AMBULATORY_CARE_PROVIDER_SITE_OTHER): Payer: Self-pay | Admitting: General Surgery

## 2013-07-19 VITALS — BP 144/98 | HR 100 | Temp 97.8°F | Resp 16 | Ht 62.0 in | Wt 151.4 lb

## 2013-07-19 DIAGNOSIS — N6320 Unspecified lump in the left breast, unspecified quadrant: Secondary | ICD-10-CM

## 2013-07-19 DIAGNOSIS — Z853 Personal history of malignant neoplasm of breast: Secondary | ICD-10-CM | POA: Insufficient documentation

## 2013-07-19 DIAGNOSIS — N63 Unspecified lump in unspecified breast: Secondary | ICD-10-CM

## 2013-07-19 HISTORY — DX: Personal history of malignant neoplasm of breast: Z85.3

## 2013-07-19 HISTORY — DX: Unspecified lump in the left breast, unspecified quadrant: N63.20

## 2013-07-19 NOTE — Progress Notes (Addendum)
Patient ID: [name], female   DOB: [birth year], 58 y.o.   MRN: [id number]  Chief Complaint  Patient presents with  . Breast Problem    est pt new prob- eval thin thickening of lt breast, hx oflt breast cancer    HPI [name] is a 58 y.o. female.  She is referred by Dr. Bedelia Person at the breast center [facility] for a left breast mass and skin thickening. Dr. Burney Gauze  is her long time medical oncologist. Dr. Faythe Dingwall is her cardiologist at [facility].  This patient has a history of breast cancer of the left breast. In 2002 I performed a left partial mastectomy and sentinel node biopsy for a small cancer in the upper outer quadrant, pathologic stage T1c, N0. She had a Port-A-Cath inserted, received FEC  chemotherapy with Dr. Burney Gauze.  She states she received radiation therapy and AI therapy.  BSO was performed.   A Port-A-Cath has been removed. She has had no known recurrence to date.   Sometime within the last 2-4 weeks she felt a painless lump in her left breast medial to the areola. She doesn't report much in the way of skin change but this was a new finding and was distressing to her. Screening mammograms in June of 2014 were normal. Diagnostic left mammogram and ultrasound of left breast was performed on 07/09/2013 inches in crease skin thickening in the left breast lower inner quadrant but no other distinct imaging abnormality to correspond to the palpable mass. Surgical consult was recommended for possible punch biopsy of skin. Breast MRI was thought to be possibly helpful. BI-RADS category 4.  Comorbidities include atrial fibrillation on Xaralto. Non-insulin-dependent diabetes. Hypertension. Hyperlipidemia. History of bilateral salpingo-oophorectomy. Last colonoscopy 4 years ago. Recent back surgery and rodding by Dr. Rolena Infante.   HPI  Past Medical History  Diagnosis Date  . PONV (postoperative nausea and vomiting)   . Dysrhythmia     atrial fib/dr [facility]  cardiology  . Asthma   . Heart murmur   . Sepsis 05    kidney stone infection  . GERD (gastroesophageal reflux disease)   . Family history of anesthesia complication     "my mother also had PONV" (05/15/2013)  . High cholesterol   . CHF (congestive heart failure)     "mild" (05/15/2013)  . Pneumonia     "used to be chronic; last time I had it was 2013" (05/15/2013)  . Chronic bronchitis   . Type II diabetes mellitus   . Tension headache   . Migraine     "haven't had one in the early 2000's" (05/15/2013)  . Arthritis     "back, knees, arms, wrists" (05/15/2013)  . Chronic lower back pain   . Kidney stones   . Recurrent UTI (urinary tract infection)     "from the continuous kidney stones; take Macrodantin qd" (05/15/2013)  . Breast cancer     Past Surgical History  Procedure Laterality Date  . Breast lumpectomy Left 02/2001  . Tubal ligation Bilateral 1985  . Bilateral oophorectomy Bilateral 2006    "cause I needed to 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" (05/15/2013)  . Lithotripsy      "2-3 times prior to 2002" (05/15/2013)  . Spinal fusion N/A 05/08/2013    Procedure: T9-S1 INSTRUMENTED FUSION T12 -S1 DECOMPRESSION;  Surgeon: Melina Schools, MD;  Location: [facility];  Service: Orthopedics;  Laterality: N/A;  . Breast biopsy Left 02/2001    No  family history on file.  Social History History  Substance Use Topics  . Smoking status: Never Smoker   . Smokeless tobacco: Never Used  . Alcohol Use: No    Allergies  Allergen Reactions  . Other Nausea And Vomiting and Swelling    Shrimp if eat a lot  . Cleocin [Clindamycin Hcl] Other (See Comments)    "irritated my esophagus"  . Morphine And Related Nausea And Vomiting  . Pneumococcal Vaccines Other (See Comments)    Really high fever, and flu symptoms. MD instructed not to give    Current Outpatient Prescriptions  Medication Sig Dispense Refill  . albuterol (PROVENTIL HFA;VENTOLIN HFA) 108 (90 BASE)  MCG/ACT inhaler Inhale 2 puffs into the lungs every 6 (six) hours as needed for wheezing or shortness of breath.      . carvedilol (COREG) 6.25 MG tablet Take 6.25 mg by mouth 2 (two) times daily with a meal.       . docusate sodium (COLACE) 100 MG capsule Take 1 capsule (100 mg total) by mouth 2 (two) times daily.  60 capsule  0  . flecainide (TAMBOCOR) 50 MG tablet Take 50 mg by mouth 2 (two) times daily.       . fluticasone (FLONASE) 50 MCG/ACT nasal spray Place 2 sprays into the nose daily as needed for rhinitis or allergies.      Marland Kitchen glipiZIDE (GLUCOTROL XL) 5 MG 24 hr tablet Take 5-10 mg by mouth 2 (two) times daily. 2 tablets every morning and 1 tablet every evening      . lisinopril-hydrochlorothiazide (PRINZIDE,ZESTORETIC) 10-12.5 MG per tablet Take 1 tablet by mouth daily.       Marland Kitchen loratadine (CLARITIN) 10 MG tablet Take 10 mg by mouth daily as needed for allergies.      . metFORMIN (GLUCOPHAGE) 1000 MG tablet Take 1,000 mg by mouth 2 (two) times daily with a meal.      . methocarbamol (ROBAXIN) 500 MG tablet Take 1 tablet (500 mg total) by mouth every 6 (six) hours as needed for muscle spasms.  60 tablet  0  . omeprazole (PRILOSEC) 20 MG capsule Take 20 mg by mouth.       . ondansetron (ZOFRAN) 4 MG tablet Take 1 tablet (4 mg total) by mouth every 8 (eight) hours as needed for nausea or vomiting.  50 tablet  0  . polyethylene glycol (MIRALAX / GLYCOLAX) packet Take 17 g by mouth daily as needed for mild constipation.      . rosuvastatin (CRESTOR) 10 MG tablet Take 10 mg by mouth at bedtime.      . sitaGLIPtin (JANUVIA) 100 MG tablet Take 100 mg by mouth daily.      Alveda Reasons 20 MG TABS tablet Take 20 mg by mouth daily.        No current facility-administered medications for this visit.    Review of Systems Review of Systems  Constitutional: Negative for fever, chills and unexpected weight change.  HENT: Negative for congestion, hearing loss, sore throat, trouble swallowing and voice  change.   Eyes: Negative for visual disturbance.  Respiratory: Negative for cough and wheezing.   Cardiovascular: Positive for palpitations. Negative for chest pain and leg swelling.  Gastrointestinal: Negative for nausea, vomiting, abdominal pain, diarrhea, constipation, blood in stool, abdominal distention and anal bleeding.  Genitourinary: Negative for hematuria, vaginal bleeding and difficulty urinating.  Musculoskeletal: Positive for back pain. Negative for arthralgias.  Skin: Negative for rash and wound.  Neurological: Negative  for seizures, syncope and headaches.  Hematological: Negative for adenopathy. Does not bruise/bleed easily.  Psychiatric/Behavioral: Negative for confusion.    Blood pressure 144/98, pulse 100, temperature 97.8 F (36.6 C), temperature source Temporal, resp. rate 16, height 5\' 2"  (1.575 m), weight 151 lb 6.4 oz (68.675 kg).  Physical Exam Physical Exam  Constitutional: She is oriented to person, place, and time. She appears well-developed and well-nourished. No distress.  HENT:  Head: Normocephalic and atraumatic.  Nose: Nose normal.  Mouth/Throat: No oropharyngeal exudate.  Eyes: Conjunctivae and EOM are normal. Pupils are equal, round, and reactive to light. Left eye exhibits no discharge. No scleral icterus.  Neck: Neck supple. No JVD present. No tracheal deviation present. No thyromegaly present.  Cardiovascular: Normal rate, regular rhythm, normal heart sounds and intact distal pulses.   No murmur heard. Pulmonary/Chest: Effort normal and breath sounds normal. No respiratory distress. She has no wheezes. She has no rales. She exhibits no tenderness.    Breasts skin and nipple and areolar skin actually looks pretty good. No pathologic thickening or color change. 2.5 cm dominant palpable mass left breast, 9:00 position. This is mobile. No other masses in either breast. Well-healed scars left breast upper outer quadrant left axilla. Right breast  unremarkable.  Abdominal: Soft. Bowel sounds are normal. She exhibits no distension and no mass. There is no tenderness. There is no rebound and no guarding.  Small infraumbilical incision.  Musculoskeletal: She exhibits no edema and no tenderness.  Lymphadenopathy:    She has no cervical adenopathy.  Neurological: She is alert and oriented to person, place, and time. She exhibits normal muscle tone. Coordination normal.  Skin: Skin is warm. No rash noted. She is not diaphoretic. No erythema. No pallor.  Psychiatric: She has a normal mood and affect. Her behavior is normal. Judgment and thought content normal.    Data Reviewed Dr. Antonieta Pert office notes. My office notes. Recent imaging studies.  Assessment    Palpable mass left breast, 9 o' clock position. New finding according to patient history. Eventually, we will require tissue diagnosis.  No evidence of pathologic skin change on exam today  History left breast cancer, stage I, treated in 2002 with surgery, radiation therapy, FAC chemotherapy, and adjuvant therapy.  Intermittent atrial fibrillation on Xaralto (Dr. Faythe Dingwall at [facility])  History of bilateral salpingo-oophorectomy  Non-insulin-dependent diabetes mellitus  Hypertension  Hyperlipidemia  Brief recent back surgery with body by Dr. Rolena Infante.     Plan    Hold off on skin punch biopsy for now  Schedule bilateral breast MRI. If enhancing mass is found, anticipate radiology will biopsy this  ADDENDUM (07/30/2013):  MRI was performed by Dr. Conchita Paris, and we discussed the findings. There is a segmental area of abnormal clumped nodular enhancement in the left lower inner quadrant, consistent with the palpable finding, this is suspicious. Fat necrosis or postradiation changes also possible. Dr. Nyoka Cowden is going to contact the patient to schedule MRI guided core biopsy. We have also called the patient to alert her that this will be arranged. She will followup with me  after the biopsy is performed.  ADDENDUM: (08/07/2013):  MRI guided biopsy shows benign fat necrosis.  Patient wanted to cancel appt. So she could attend the [facility]. Will offer followup in  6 weeks.  Dr. Shon Hale of the breast the center of [facility] radiology has advised and will schedule a bilateral breast MRI in 6 months.  Return to see me in 10-12 days. If diagnosis  remains in question she will need an open biopsy.  If the biopsy required, she will need to be off of Xaralto for 2  Days or so.        Edsel Petrin. Dalbert Batman, M.D., [facility] General and Minimally invasive Surgery Breast and Colorectal Surgery Office:   [phone] Pager:   [phone]  07/19/2013, 5:51 PM

## 2013-07-19 NOTE — Patient Instructions (Signed)
Your physical exam confirms a discrete palpable mass in the left breast at the 9:00 position. The overlying skin and nipple looked normal, and do not appear thickened, so we decided not to do a punch biopsy today.  Because of this new finding and your history of breast cancer, you'll be scheduled for bilateral breast MRI. If the MRI shows something, the radiologist may go ahead and biopsy this area.  Return to see Dr. Dalbert Batman in 10-14 days. If the diagnosis is still in question, then you'll need to be scheduled for an open biopsy.

## 2013-07-22 ENCOUNTER — Telehealth (INDEPENDENT_AMBULATORY_CARE_PROVIDER_SITE_OTHER): Payer: Self-pay | Admitting: *Deleted

## 2013-07-22 ENCOUNTER — Other Ambulatory Visit (INDEPENDENT_AMBULATORY_CARE_PROVIDER_SITE_OTHER): Payer: Self-pay | Admitting: General Surgery

## 2013-07-22 ENCOUNTER — Other Ambulatory Visit (INDEPENDENT_AMBULATORY_CARE_PROVIDER_SITE_OTHER): Payer: Self-pay

## 2013-07-22 DIAGNOSIS — Z853 Personal history of malignant neoplasm of breast: Secondary | ICD-10-CM

## 2013-07-22 DIAGNOSIS — N63 Unspecified lump in unspecified breast: Secondary | ICD-10-CM

## 2013-07-22 DIAGNOSIS — N6489 Other specified disorders of breast: Secondary | ICD-10-CM

## 2013-07-22 NOTE — Telephone Encounter (Signed)
Pt returned my call and was given below information. She is agreeable at this time.

## 2013-07-22 NOTE — Telephone Encounter (Signed)
LMOM for pt to return my call.  I was calling pt to inform her of the appt for her Right breast MM at [facility] on 07/26/13 with an arrival time of 2:15pm.  Also, she is scheduled for her Breast MRI at [facility] on 07/29/13 with an arrival time of 4:45pm.  To have the MRI done she is required to have a bilateral MM done, she had a Left MM done on 07/09/13.

## 2013-07-26 ENCOUNTER — Ambulatory Visit
Admission: RE | Admit: 2013-07-26 | Discharge: 2013-07-26 | Disposition: A | Discharge status: Home / Self Care | Payer: Federal, State, Local not specified - PPO | Source: Ambulatory Visit | Attending: General Surgery | Admitting: General Surgery

## 2013-07-26 DIAGNOSIS — N6489 Other specified disorders of breast: Secondary | ICD-10-CM

## 2013-07-29 ENCOUNTER — Ambulatory Visit
Admission: RE | Admit: 2013-07-29 | Discharge: 2013-07-29 | Disposition: A | Discharge status: Home / Self Care | Payer: Federal, State, Local not specified - PPO | Source: Ambulatory Visit | Attending: General Surgery | Admitting: General Surgery

## 2013-07-29 DIAGNOSIS — N6320 Unspecified lump in the left breast, unspecified quadrant: Secondary | ICD-10-CM

## 2013-07-29 DIAGNOSIS — Z853 Personal history of malignant neoplasm of breast: Secondary | ICD-10-CM

## 2013-07-29 MED ORDER — GADOBENATE DIMEGLUMINE 529 MG/ML IV SOLN
15.0000 mL | Freq: Once | INTRAVENOUS | Status: AC | PRN
  Administered 2013-07-29: 15 mL via INTRAVENOUS

## 2013-07-30 ENCOUNTER — Encounter (INDEPENDENT_AMBULATORY_CARE_PROVIDER_SITE_OTHER): Payer: Federal, State, Local not specified - PPO | Admitting: General Surgery

## 2013-07-30 ENCOUNTER — Telehealth (INDEPENDENT_AMBULATORY_CARE_PROVIDER_SITE_OTHER): Payer: Self-pay

## 2013-07-30 ENCOUNTER — Other Ambulatory Visit (INDEPENDENT_AMBULATORY_CARE_PROVIDER_SITE_OTHER): Payer: Self-pay | Admitting: General Surgery

## 2013-07-30 DIAGNOSIS — R928 Other abnormal and inconclusive findings on diagnostic imaging of breast: Secondary | ICD-10-CM

## 2013-07-30 NOTE — Telephone Encounter (Signed)
I called and spoke to pt about her MRI.  Dr Nyoka Cowden at [facility] recommends bx of the area we are looking at.  I told her they will call and scheduled her appointment in a day or two.  We need to see her after at least 3-4 days to have the results.  I offered for her to come in today anyway if she wants to talk to Dr Dalbert Batman or we can reschedule.  She decided to wait and cancel today's appointment.  She will let me know when her bx is and we will reschedule her office appointment.

## 2013-07-31 ENCOUNTER — Telehealth (INDEPENDENT_AMBULATORY_CARE_PROVIDER_SITE_OTHER): Payer: Self-pay

## 2013-07-31 NOTE — Telephone Encounter (Signed)
Left message for pt to call.  I wanted to see if she was given her bx date yet.  It is in the computer.  I have scheduled her appointment with Dr Dalbert Batman which I wanted to give her for 2/11at 2pm

## 2013-08-01 ENCOUNTER — Encounter (INDEPENDENT_AMBULATORY_CARE_PROVIDER_SITE_OTHER): Payer: Federal, State, Local not specified - PPO | Admitting: General Surgery

## 2013-08-01 NOTE — Telephone Encounter (Signed)
Pt called back and confirmed all.

## 2013-08-01 NOTE — Telephone Encounter (Signed)
Left message for pt to call.  We can let her know the bx results when it is final next week.  I also rescheduled her appointment to 2/5.  The appointment is in the computer.

## 2013-08-06 ENCOUNTER — Other Ambulatory Visit (HOSPITAL_COMMUNITY): Payer: Self-pay | Admitting: Diagnostic Radiology

## 2013-08-06 ENCOUNTER — Ambulatory Visit
Admission: RE | Admit: 2013-08-06 | Discharge: 2013-08-06 | Disposition: A | Discharge status: Home / Self Care | Payer: Federal, State, Local not specified - PPO | Source: Ambulatory Visit | Attending: General Surgery | Admitting: General Surgery

## 2013-08-06 DIAGNOSIS — R928 Other abnormal and inconclusive findings on diagnostic imaging of breast: Secondary | ICD-10-CM

## 2013-08-06 MED ORDER — GADOBENATE DIMEGLUMINE 529 MG/ML IV SOLN
14.0000 mL | Freq: Once | INTRAVENOUS | Status: AC | PRN
  Administered 2013-08-06: 14 mL via INTRAVENOUS

## 2013-08-07 ENCOUNTER — Telehealth (INDEPENDENT_AMBULATORY_CARE_PROVIDER_SITE_OTHER): Payer: Self-pay | Admitting: *Deleted

## 2013-08-07 NOTE — Telephone Encounter (Signed)
Message copied by Theora Master on Wed Aug 07, 2013  3:19 PM ------      Message from: Maryclare Bean      Created: Wed Aug 07, 2013  2:51 PM      Regarding: FW: Dr. Dalbert Batman Bx result/ appt 08/18/13      Contact: [phone]                   ----- Message -----         From: Salvatore Marvel         Sent: 08/07/2013   2:35 PM           To: Maryclare Bean, MA      Subject: Dr. Dalbert Batman Bx result/ appt 08/18/13                      Patient called and states her Bx results was good and wants to know if still needs to come Tomorrow to see Dr. Dalbert Batman or has to cancel the appt., Please call her.            Thank you. ------

## 2013-08-07 NOTE — Telephone Encounter (Signed)
I spoke to Dr. Dalbert Batman who stated that he would review patient's results and then we can call her at a later date for follow up but she does not need to come in tomorrow.  Patient updated and appt cancelled per patients request.

## 2013-08-08 ENCOUNTER — Encounter (INDEPENDENT_AMBULATORY_CARE_PROVIDER_SITE_OTHER): Payer: Federal, State, Local not specified - PPO | Admitting: General Surgery

## 2013-08-14 ENCOUNTER — Encounter (INDEPENDENT_AMBULATORY_CARE_PROVIDER_SITE_OTHER): Payer: Federal, State, Local not specified - PPO | Admitting: General Surgery

## 2013-09-17 ENCOUNTER — Ambulatory Visit (INDEPENDENT_AMBULATORY_CARE_PROVIDER_SITE_OTHER): Payer: Federal, State, Local not specified - PPO | Admitting: General Surgery

## 2013-09-17 ENCOUNTER — Encounter (INDEPENDENT_AMBULATORY_CARE_PROVIDER_SITE_OTHER): Payer: Self-pay | Admitting: General Surgery

## 2013-09-17 VITALS — BP 126/74 | HR 68 | Temp 97.8°F | Resp 16 | Ht 62.0 in | Wt 154.0 lb

## 2013-09-17 DIAGNOSIS — N63 Unspecified lump in unspecified breast: Secondary | ICD-10-CM

## 2013-09-17 DIAGNOSIS — N6320 Unspecified lump in the left breast, unspecified quadrant: Secondary | ICD-10-CM

## 2013-09-17 DIAGNOSIS — Z853 Personal history of malignant neoplasm of breast: Secondary | ICD-10-CM

## 2013-09-17 NOTE — Patient Instructions (Signed)
Your MRI showed an enhancing mass medially in the left breast. The image guided biopsy shows fat necrosis.  The likeliest possibility is that this is fat necrosis secondary to trauma. It is unlikely to be secondary to radiation therapy this long after surgery.  There is a possibility that there is cancer present because of your history of breast cancer and the size of the palpable mass.  We discussed your options. You stated that you would like to have this area conservatively excised to clarify the diagnosis. We will schedule that for you in the future.     Lumpectomy A lumpectomy is a form of "breast conserving" or "breast preservation" surgery. It may also be referred to as a partial mastectomy. During a lumpectomy, the portion of the breast that contains the cancerous tumor or breast mass (the lump) is removed. Some normal tissue around the lump may also be removed to make sure all the tumor has been removed. This surgery should take 40 minutes or less. LET [facility] CARE PROVIDER KNOW ABOUT:  Any allergies you have.  All medicines you are taking, including vitamins, herbs, eye drops, creams, and over-the-counter medicines.  Previous problems you or members of your family have had with the use of anesthetics.  Any blood disorders you have.  Previous surgeries you have had.  Medical conditions you have. RISKS AND COMPLICATIONS Generally, this is a safe procedure. However, as with any procedure, complications can occur. Possible complications include:  Bleeding.  Infection.  Pain.  Temporary swelling.  Change in the shape of the breast, particularly if a large portion is removed. BEFORE THE PROCEDURE  Ask your health care provider about changing or stopping your regular medicines.  Do not eat or drink anything for 7 8 hours before the surgery or as directed by your health care provider. Ask your health care provider if you can take a sip of water with any approved  medicines.  On the day of surgery, your healthcare provider will use a mammogram or ultrasound to locate and mark the tumor in your breast. These markings on your breast will show where the cut (incision) will be made. PROCEDURE   An IV tube will be put into one of your veins.  You may be given medicine to help you relax before the surgery (sedative). You will be given one of the following:  A medicine that numbs the area (local anesthesia).  A medicine that makes you go to sleep (general anesthesia).  Your health care provider will use a kind of electric scalpel that uses heat to minimize bleeding (electrocautery knife).  A curved incision (like a smile or frown) that follows the natural curve of your breast is made, to allow for minimal scarring and better healing.  The tumor will be removed with some of the surrounding tissue. This will be sent to the lab for analysis. Your health care provider may also remove your lymph nodes at this time if needed.  Sometimes, but not always, a rubber tube called a drain will be surgically inserted into your breast area or armpit to collect excess fluid that may accumulate in the space where the tumor was. This drain is connected to a plastic bulb on the outside of your body. This drain creates suction to help remove the fluid.  The incisions will be closed with stitches (sutures).  A bandage may be placed over the incisions. AFTER THE PROCEDURE  You will be taken to the recovery area.  You will  be given medicine for pain.  A small rubber drain may be placed in the breast for 2 3 days to prevent a collection of blood (hematoma) from developing in the breast. You will be given instructions on caring for the drain before you go home.  A pressure bandage (dressing) will be applied for 1 2 days to prevent bleeding. Ask your health care provider how to care for your bandage at home. Document Released: 08/01/2006 Document Revised: 02/20/2013 Document  Reviewed: 11/23/2012 [facility] Patient Information 2014 [place].

## 2013-09-17 NOTE — Progress Notes (Signed)
Patient ID: [name], female   DOB: [birth year], 58 y.o.   MRN: [id number] History: Ms. Beus returns for discussion and management of the palpable mass of her left breast. Recall that she underwent left partial mastectomy and sentinel node biopsy in 2002 for a small cancer in the upper outer quadrant. Stage TI C., N0. Port-A-Cath was inserted. She received chemotherapy. She received radiation therapy and aromatase inhibitor therapy. BSO was performed. Port-A-Cath has been removed. She had a back operation and states that she laid prone on the operating table. She recently noticed a painless lump in her left breast medial to the areola. Mammograms didn't show much but MRI showed an enhancing mass thought to be somewhat suspicious for malignancy. Fat necrosis or postradiation changes were also thought to be possible. MRI guided biopsy showed fat necrosis. MRI in 6 months was recommended.    the patient is concerned and would like a more definitive answer considering her past history of breast cancer.  Past history, family history, social history, and review of systems are documented on the chart, unchanged and noncontributory except as described above.  Chronic atrial fibrillation on Xaralto. Non-insulin-dependent diabetes. Hypertension. Hyperlipidemia. History of BSO. Recent back surgery and rodding by Dr. Rolena Infante  Exam: Alert. No distress. Mental status normal Neck no adenopathy or mass Lungs clear to auscultation bilaterally Heart regular rate and rhythm doesn't sound like atrial fibrillation today. Breasts skin healthy. Healed incision left breast upper outer quadrant left axilla. No mass there. 2.5 cm x 5 cm transversely Mass left breast, medial aspect. Mobile. Not fixed to chest wall or skin.  Assessment: Palpable mass left breast, 9 o' clock position. New finding according to patient history. Fat necrosis by recent MRI guided biopsy. Cannot exclude underlying malignancy, especially  considering history of left breast cancer.    History left breast cancer, stage I, treated in 2002 with surgery, radiation therapy, FAC chemotherapy, and adjuvant therapy.   Intermittent atrial fibrillation on Xaralto (Dr. Faythe Dingwall at [facility])   History of bilateral salpingo-oophorectomy  Non-insulin-dependent diabetes mellitus  Hypertension  Hyperlipidemia    Plan: Schedule for conservative excision of left breast mass at 8:30 position. We do not need a localization technique because it is palpable. We will consider specimen mammogram because  a marker clip present I discussed the indications, details, techniques, and numerous risks of the surgery with her. She's aware of the risk of bleeding, infection, reoperation if this turns out to be malignant, cosmetic deformity, and other unforeseen problems. She understands all these issues. All of her questions are answered. She agrees with this plan.     Edsel Petrin. Dalbert Batman, M.D., [facility] General and Minimally invasive Surgery Breast and Colorectal Surgery Office:   [phone] Pager:   [phone]

## 2013-09-18 ENCOUNTER — Encounter (HOSPITAL_BASED_OUTPATIENT_CLINIC_OR_DEPARTMENT_OTHER): Payer: Self-pay | Admitting: *Deleted

## 2013-09-18 ENCOUNTER — Encounter (HOSPITAL_BASED_OUTPATIENT_CLINIC_OR_DEPARTMENT_OTHER)
Admission: RE | Admit: 2013-09-18 | Discharge: 2013-09-18 | Disposition: A | Discharge status: Home / Self Care | Payer: Federal, State, Local not specified - PPO | Source: Ambulatory Visit | Attending: General Surgery | Admitting: General Surgery

## 2013-09-18 DIAGNOSIS — Z01812 Encounter for preprocedural laboratory examination: Secondary | ICD-10-CM | POA: Insufficient documentation

## 2013-09-18 LAB — CBC WITH DIFFERENTIAL/PLATELET
Basophils Absolute: 0 10*3/uL (ref 0.0–0.1)
Basophils Relative: 0 % (ref 0–1)
Eosinophils Absolute: 0.4 10*3/uL (ref 0.0–0.7)
Eosinophils Relative: 5 % (ref 0–5)
HCT: 33.3 % — ABNORMAL LOW (ref 36.0–46.0)
Hemoglobin: 11.2 g/dL — ABNORMAL LOW (ref 12.0–15.0)
LYMPHS ABS: 1.9 10*3/uL (ref 0.7–4.0)
Lymphocytes Relative: 25 % (ref 12–46)
MCH: 28.9 pg (ref 26.0–34.0)
MCHC: 33.6 g/dL (ref 30.0–36.0)
MCV: 85.8 fL (ref 78.0–100.0)
MONOS PCT: 4 % (ref 3–12)
Monocytes Absolute: 0.3 10*3/uL (ref 0.1–1.0)
NEUTROS ABS: 5 10*3/uL (ref 1.7–7.7)
NEUTROS PCT: 66 % (ref 43–77)
Platelets: 310 10*3/uL (ref 150–400)
RBC: 3.88 MIL/uL (ref 3.87–5.11)
RDW: 14.8 % (ref 11.5–15.5)
WBC: 7.6 10*3/uL (ref 4.0–10.5)

## 2013-09-18 LAB — COMPREHENSIVE METABOLIC PANEL
ALT: 20 U/L (ref 0–35)
AST: 23 U/L (ref 0–37)
Albumin: 4 g/dL (ref 3.5–5.2)
Alkaline Phosphatase: 70 U/L (ref 39–117)
BILIRUBIN TOTAL: 0.3 mg/dL (ref 0.3–1.2)
BUN: 18 mg/dL (ref 6–23)
CO2: 25 mEq/L (ref 19–32)
Calcium: 10.1 mg/dL (ref 8.4–10.5)
Chloride: 102 mEq/L (ref 96–112)
Creatinine, Ser: 0.89 mg/dL (ref 0.50–1.10)
GFR calc non Af Amer: 70 mL/min — ABNORMAL LOW (ref >90)
GFR, EST AFRICAN AMERICAN: 81 mL/min — AB (ref >90)
GLUCOSE: 260 mg/dL — AB (ref 70–99)
POTASSIUM: 3.7 meq/L (ref 3.7–5.3)
Sodium: 144 mEq/L (ref 137–147)
Total Protein: 7.8 g/dL (ref 6.0–8.3)

## 2013-09-18 NOTE — Progress Notes (Signed)
To come in for [facility] labs-had a very extensive thoracic and lumbar surgery-fusion 11/14-had a cardiac work up and clearance for that-hx AF ekg done 11/14

## 2013-09-19 NOTE — H&P (Signed)
  History:  [name] returns for discussion and management of the palpable mass of her left breast.  Recall that she underwent left partial mastectomy and sentinel node biopsy in 2002 for a small cancer in the upper outer quadrant. Stage T1c., N0.    Port-A-Cath was inserted. She received chemotherapy. She received radiation therapy and aromatase inhibitor therapy. BSO was performed. Port-A-Cath has been removed.  She had a back operation and states that she laid prone on the operating table.  Wonders if she injured her breast, but no history of pain or bruise.  She recently noticed a painless lump in her left breast medial to the areola. Mammograms didn't show much but MRI showed an enhancing mass thought to be somewhat suspicious for malignancy. Fat necrosis or postradiation changes were also thought to be possible. MRI guided biopsy showed fat necrosis. MRI in 6 months was recommended. the patient is concerned and would like a more definitive answer considering her past history of breast cancer.   Past history, family history, social history, and review of systems are documented on the chart, unchanged and noncontributory except as described above.  Chronic atrial fibrillation on Xaralto. Non-insulin-dependent diabetes. Hypertension. Hyperlipidemia. History of BSO. Recent back surgery and rodding by Dr. Rolena Infante    Exam:  Alert. No distress. Mental status normal  Neck no adenopathy or mass  Lungs clear to auscultation bilaterally  Heart regular rate and rhythm doesn't sound like atrial fibrillation today.  Breasts skin healthy. Healed incision left breast upper outer quadrant left axilla. No mass there. 2.5 cm x 5 cm transversely Mass left breast, medial aspect. Mobile. Not fixed to chest wall or skin.   Assessment:  Palpable mass left breast, 9 o' clock position. New finding according to patient history. Fat necrosis by recent MRI guided biopsy. Cannot exclude underlying malignancy, especially  considering history of left breast cancer.  History left breast cancer, stage I, treated in 2002 with surgery, radiation therapy, FAC chemotherapy, and adjuvant therapy.  Intermittent atrial fibrillation on Xaralto (Dr. Faythe Dingwall at [facility])  History of bilateral salpingo-oophorectomy  Non-insulin-dependent diabetes mellitus  Hypertension  Hyperlipidemia   Plan:  Schedule for conservative excision of left breast mass at 8:30 position. We do not need a localization technique because it is palpable. We will consider specimen mammogram because a marker clip present  I discussed the indications, details, techniques, and numerous risks of the surgery with her. She's aware of the risk of bleeding, infection, reoperation if this turns out to be malignant, cosmetic deformity, and other unforeseen problems. She understands all these issues. All of her questions are answered. She agrees with this plan.      Edsel Petrin. Dalbert Batman, M.D., [facility]  General and Minimally invasive Surgery  Breast and Colorectal Surgery  Office: [phone]  Pager: [phone]

## 2013-09-23 ENCOUNTER — Ambulatory Visit (HOSPITAL_BASED_OUTPATIENT_CLINIC_OR_DEPARTMENT_OTHER): Payer: Federal, State, Local not specified - PPO | Admitting: Certified Registered"

## 2013-09-23 ENCOUNTER — Encounter (HOSPITAL_BASED_OUTPATIENT_CLINIC_OR_DEPARTMENT_OTHER): Payer: Self-pay | Admitting: *Deleted

## 2013-09-23 ENCOUNTER — Ambulatory Visit (HOSPITAL_BASED_OUTPATIENT_CLINIC_OR_DEPARTMENT_OTHER)
Admission: RE | Admit: 2013-09-23 | Discharge: 2013-09-23 | Disposition: A | Discharge status: Home / Self Care | Payer: Federal, State, Local not specified - PPO | Source: Ambulatory Visit | Attending: General Surgery | Admitting: General Surgery

## 2013-09-23 ENCOUNTER — Encounter (HOSPITAL_BASED_OUTPATIENT_CLINIC_OR_DEPARTMENT_OTHER): Payer: Federal, State, Local not specified - PPO | Admitting: Certified Registered"

## 2013-09-23 ENCOUNTER — Encounter (HOSPITAL_BASED_OUTPATIENT_CLINIC_OR_DEPARTMENT_OTHER)
Admission: RE | Disposition: A | Discharge status: Home / Self Care | Payer: Self-pay | Source: Ambulatory Visit | Attending: General Surgery

## 2013-09-23 DIAGNOSIS — N63 Unspecified lump in unspecified breast: Secondary | ICD-10-CM | POA: Insufficient documentation

## 2013-09-23 DIAGNOSIS — N6320 Unspecified lump in the left breast, unspecified quadrant: Secondary | ICD-10-CM

## 2013-09-23 DIAGNOSIS — I10 Essential (primary) hypertension: Secondary | ICD-10-CM | POA: Insufficient documentation

## 2013-09-23 DIAGNOSIS — Z853 Personal history of malignant neoplasm of breast: Secondary | ICD-10-CM | POA: Insufficient documentation

## 2013-09-23 DIAGNOSIS — J45909 Unspecified asthma, uncomplicated: Secondary | ICD-10-CM | POA: Insufficient documentation

## 2013-09-23 DIAGNOSIS — N641 Fat necrosis of breast: Secondary | ICD-10-CM | POA: Insufficient documentation

## 2013-09-23 DIAGNOSIS — Z7901 Long term (current) use of anticoagulants: Secondary | ICD-10-CM | POA: Insufficient documentation

## 2013-09-23 DIAGNOSIS — N6019 Diffuse cystic mastopathy of unspecified breast: Secondary | ICD-10-CM

## 2013-09-23 DIAGNOSIS — I509 Heart failure, unspecified: Secondary | ICD-10-CM | POA: Insufficient documentation

## 2013-09-23 DIAGNOSIS — E785 Hyperlipidemia, unspecified: Secondary | ICD-10-CM | POA: Insufficient documentation

## 2013-09-23 DIAGNOSIS — K219 Gastro-esophageal reflux disease without esophagitis: Secondary | ICD-10-CM | POA: Insufficient documentation

## 2013-09-23 DIAGNOSIS — Z923 Personal history of irradiation: Secondary | ICD-10-CM | POA: Insufficient documentation

## 2013-09-23 HISTORY — PX: BREAST LUMPECTOMY: SHX2

## 2013-09-23 LAB — GLUCOSE, CAPILLARY: GLUCOSE-CAPILLARY: 144 mg/dL — AB (ref 70–99)

## 2013-09-23 SURGERY — BREAST LUMPECTOMY
Anesthesia: General | Site: Breast | Laterality: Left

## 2013-09-23 MED ORDER — LACTATED RINGERS IV SOLN
INTRAVENOUS | Status: DC
  Administered 2013-09-23: 08:00:00 via INTRAVENOUS

## 2013-09-23 MED ORDER — DEXAMETHASONE SODIUM PHOSPHATE 4 MG/ML IJ SOLN
INTRAMUSCULAR | Status: DC | PRN
  Administered 2013-09-23: 4 mg via INTRAVENOUS

## 2013-09-23 MED ORDER — FENTANYL CITRATE 0.05 MG/ML IJ SOLN
INTRAMUSCULAR | Status: AC
  Filled 2013-09-23: qty 6

## 2013-09-23 MED ORDER — LIDOCAINE HCL (CARDIAC) 20 MG/ML IV SOLN
INTRAVENOUS | Status: DC | PRN
  Administered 2013-09-23: 60 mg via INTRAVENOUS

## 2013-09-23 MED ORDER — ONDANSETRON 8 MG PO TBDP
ORAL_TABLET | ORAL | Status: AC
  Filled 2013-09-23: qty 1

## 2013-09-23 MED ORDER — BUPIVACAINE-EPINEPHRINE 0.5% -1:200000 IJ SOLN
INTRAMUSCULAR | Status: DC | PRN
  Administered 2013-09-23: 10 mL

## 2013-09-23 MED ORDER — LIDOCAINE HCL (PF) 1 % IJ SOLN
INTRAMUSCULAR | Status: AC
  Filled 2013-09-23: qty 30

## 2013-09-23 MED ORDER — HYDROCODONE-ACETAMINOPHEN 10-325 MG PO TABS: 1.0000 | ORAL_TABLET | Freq: Four times a day (QID) | ORAL | Status: DC | PRN

## 2013-09-23 MED ORDER — SODIUM BICARBONATE 4 % IV SOLN
INTRAVENOUS | Status: AC
  Filled 2013-09-23: qty 5

## 2013-09-23 MED ORDER — ONDANSETRON 8 MG PO TBDP: 8.0000 mg | ORAL_TABLET | Freq: Once | ORAL | Status: DC | PRN

## 2013-09-23 MED ORDER — MIDAZOLAM HCL 5 MG/5ML IJ SOLN
INTRAMUSCULAR | Status: DC | PRN
  Administered 2013-09-23: 2 mg via INTRAVENOUS

## 2013-09-23 MED ORDER — ONDANSETRON HCL 4 MG/2ML IJ SOLN
INTRAMUSCULAR | Status: DC | PRN
  Administered 2013-09-23: 4 mg via INTRAVENOUS

## 2013-09-23 MED ORDER — MIDAZOLAM HCL 2 MG/2ML IJ SOLN
INTRAMUSCULAR | Status: AC
  Filled 2013-09-23: qty 2

## 2013-09-23 MED ORDER — MIDAZOLAM HCL 2 MG/2ML IJ SOLN: 1.0000 mg | INTRAMUSCULAR | Status: DC | PRN

## 2013-09-23 MED ORDER — ONDANSETRON HCL 8 MG PO TABS: 8.0000 mg | ORAL_TABLET | Freq: Once | ORAL | Status: DC | PRN

## 2013-09-23 MED ORDER — FENTANYL CITRATE 0.05 MG/ML IJ SOLN
25.0000 ug | INTRAMUSCULAR | Status: DC | PRN
  Administered 2013-09-23: 50 ug via INTRAVENOUS

## 2013-09-23 MED ORDER — OXYCODONE HCL 5 MG/5ML PO SOLN: 5.0000 mg | Freq: Once | ORAL | Status: AC | PRN

## 2013-09-23 MED ORDER — OXYCODONE HCL 5 MG PO TABS
ORAL_TABLET | ORAL | Status: AC
  Filled 2013-09-23: qty 1

## 2013-09-23 MED ORDER — OXYCODONE HCL 5 MG PO TABS
5.0000 mg | ORAL_TABLET | Freq: Once | ORAL | Status: AC | PRN
  Administered 2013-09-23: 5 mg via ORAL

## 2013-09-23 MED ORDER — BUPIVACAINE-EPINEPHRINE PF 0.5-1:200000 % IJ SOLN
INTRAMUSCULAR | Status: AC
  Filled 2013-09-23: qty 30

## 2013-09-23 MED ORDER — CEFAZOLIN SODIUM-DEXTROSE 2-3 GM-% IV SOLR
INTRAVENOUS | Status: AC
  Filled 2013-09-23: qty 50

## 2013-09-23 MED ORDER — FENTANYL CITRATE 0.05 MG/ML IJ SOLN: 50.0000 ug | INTRAMUSCULAR | Status: DC | PRN

## 2013-09-23 MED ORDER — FENTANYL CITRATE 0.05 MG/ML IJ SOLN
INTRAMUSCULAR | Status: DC | PRN
  Administered 2013-09-23 (×2): 50 ug via INTRAVENOUS

## 2013-09-23 MED ORDER — CEFAZOLIN SODIUM-DEXTROSE 2-3 GM-% IV SOLR
2.0000 g | INTRAVENOUS | Status: AC
Start: 2013-09-23 — End: 2013-09-23
  Administered 2013-09-23: 2 g via INTRAVENOUS

## 2013-09-23 MED ORDER — CHLORHEXIDINE GLUCONATE 4 % EX LIQD: 1.0000 "application " | Freq: Once | CUTANEOUS | Status: DC

## 2013-09-23 MED ORDER — PROPOFOL 10 MG/ML IV BOLUS
INTRAVENOUS | Status: DC | PRN
  Administered 2013-09-23: 120 mg via INTRAVENOUS

## 2013-09-23 MED ORDER — ONDANSETRON HCL 4 MG/2ML IJ SOLN: 4.0000 mg | Freq: Four times a day (QID) | INTRAMUSCULAR | Status: DC | PRN

## 2013-09-23 MED ORDER — FENTANYL CITRATE 0.05 MG/ML IJ SOLN
INTRAMUSCULAR | Status: AC
  Filled 2013-09-23: qty 2

## 2013-09-23 SURGICAL SUPPLY — 68 items
ADH SKN CLS APL DERMABOND .7 (GAUZE/BANDAGES/DRESSINGS)
APL SKNCLS STERI-STRIP NONHPOA (GAUZE/BANDAGES/DRESSINGS)
APPLIER CLIP 9.375 MED OPEN (MISCELLANEOUS)
APR CLP MED 9.3 20 MLT OPN (MISCELLANEOUS)
BENZOIN TINCTURE PRP APPL 2/3 (GAUZE/BANDAGES/DRESSINGS) IMPLANT
BINDER BREAST LRG (GAUZE/BANDAGES/DRESSINGS) ×2 IMPLANT
BINDER BREAST MEDIUM (GAUZE/BANDAGES/DRESSINGS) IMPLANT
BINDER BREAST XLRG (GAUZE/BANDAGES/DRESSINGS) IMPLANT
BINDER BREAST XXLRG (GAUZE/BANDAGES/DRESSINGS) IMPLANT
BLADE HEX COATED 2.75 (ELECTRODE) ×3 IMPLANT
BLADE SURG 15 STRL LF DISP TIS (BLADE) ×1 IMPLANT
BLADE SURG 15 STRL SS (BLADE) ×3
CANISTER SUCT 1200ML W/VALVE (MISCELLANEOUS) ×3 IMPLANT
CHLORAPREP W/TINT 26ML (MISCELLANEOUS) ×3 IMPLANT
CLIP APPLIE 9.375 MED OPEN (MISCELLANEOUS) IMPLANT
CLOSURE WOUND 1/2 X4 (GAUZE/BANDAGES/DRESSINGS)
COVER MAYO STAND STRL (DRAPES) ×3 IMPLANT
COVER TABLE BACK 60X90 (DRAPES) ×3 IMPLANT
DECANTER SPIKE VIAL GLASS SM (MISCELLANEOUS) IMPLANT
DERMABOND ADVANCED (GAUZE/BANDAGES/DRESSINGS)
DERMABOND ADVANCED .7 DNX12 (GAUZE/BANDAGES/DRESSINGS) IMPLANT
DEVICE DUBIN W/COMP PLATE 8390 (MISCELLANEOUS) ×2 IMPLANT
DRAPE LAPAROSCOPIC ABDOMINAL (DRAPES) IMPLANT
DRAPE LAPAROTOMY TRNSV 102X78 (DRAPE) ×2 IMPLANT
DRAPE PED LAPAROTOMY (DRAPES) ×1 IMPLANT
DRAPE UTILITY XL STRL (DRAPES) ×3 IMPLANT
DRSG PAD ABDOMINAL 8X10 ST (GAUZE/BANDAGES/DRESSINGS) ×3 IMPLANT
ELECT REM PT RETURN 9FT ADLT (ELECTROSURGICAL) ×3
ELECTRODE REM PT RTRN 9FT ADLT (ELECTROSURGICAL) ×1 IMPLANT
GAUZE SPONGE 4X4 16PLY XRAY LF (GAUZE/BANDAGES/DRESSINGS) ×4 IMPLANT
GLOVE BIOGEL M 7.0 STRL (GLOVE) ×2 IMPLANT
GLOVE BIOGEL PI IND STRL 7.5 (GLOVE) IMPLANT
GLOVE BIOGEL PI INDICATOR 7.5 (GLOVE) ×2
GLOVE EUDERMIC 7 POWDERFREE (GLOVE) ×3 IMPLANT
GLOVE EXAM NITRILE PF MED BLUE (GLOVE) ×2 IMPLANT
GOWN STRL REUS W/ TWL LRG LVL3 (GOWN DISPOSABLE) ×1 IMPLANT
GOWN STRL REUS W/ TWL XL LVL3 (GOWN DISPOSABLE) ×1 IMPLANT
GOWN STRL REUS W/TWL LRG LVL3 (GOWN DISPOSABLE) ×3
GOWN STRL REUS W/TWL XL LVL3 (GOWN DISPOSABLE) ×3
KIT MARKER MARGIN INK (KITS) ×2 IMPLANT
NDL HYPO 25X1 1.5 SAFETY (NEEDLE) ×1 IMPLANT
NEEDLE HYPO 22GX1.5 SAFETY (NEEDLE) IMPLANT
NEEDLE HYPO 25X1 1.5 SAFETY (NEEDLE) ×3 IMPLANT
NS IRRIG 1000ML POUR BTL (IV SOLUTION) ×3 IMPLANT
PACK BASIN DAY SURGERY FS (CUSTOM PROCEDURE TRAY) ×3 IMPLANT
PENCIL BUTTON HOLSTER BLD 10FT (ELECTRODE) ×3 IMPLANT
SLEEVE SCD COMPRESS KNEE MED (MISCELLANEOUS) ×3 IMPLANT
SPONGE GAUZE 4X4 12PLY STER LF (GAUZE/BANDAGES/DRESSINGS) IMPLANT
SPONGE LAP 4X18 X RAY DECT (DISPOSABLE) ×1 IMPLANT
STAPLER VISISTAT 35W (STAPLE) IMPLANT
STRIP CLOSURE SKIN 1/2X4 (GAUZE/BANDAGES/DRESSINGS) IMPLANT
SUT ETHILON 4 0 PS 2 18 (SUTURE) IMPLANT
SUT MNCRL AB 4-0 PS2 18 (SUTURE) ×2 IMPLANT
SUT SILK 2 0 SH (SUTURE) ×3 IMPLANT
SUT VIC AB 2-0 SH 27 (SUTURE)
SUT VIC AB 2-0 SH 27XBRD (SUTURE) IMPLANT
SUT VIC AB 3-0 FS2 27 (SUTURE) IMPLANT
SUT VIC AB 4-0 P-3 18XBRD (SUTURE) IMPLANT
SUT VIC AB 4-0 P3 18 (SUTURE)
SUT VICRYL 3-0 CR8 SH (SUTURE) ×3 IMPLANT
SUT VICRYL 4-0 PS2 18IN ABS (SUTURE) IMPLANT
SYR BULB 3OZ (MISCELLANEOUS) IMPLANT
SYR CONTROL 10ML LL (SYRINGE) ×3 IMPLANT
TAPE HYPAFIX 4 X10 (GAUZE/BANDAGES/DRESSINGS) IMPLANT
TOWEL OR NON WOVEN STRL DISP B (DISPOSABLE) ×3 IMPLANT
TUBE CONNECTING 20'X1/4 (TUBING) ×1
TUBE CONNECTING 20X1/4 (TUBING) ×2 IMPLANT
YANKAUER SUCT BULB TIP NO VENT (SUCTIONS) ×3 IMPLANT

## 2013-09-23 NOTE — Interval H&P Note (Signed)
History and Physical Interval Note:  09/23/2013 8:15 AM  [name]  has presented today for surgery, with the diagnosis of left breast mass  The goals and the various methods of treatment have been discussed with the patient and family. After consideration of risks, benefits and other options for treatment, the patient has consented to  Procedure(s): LEFT LUMPECTOMY WITH SPECIMEN MAMMOGRAM (Left) as a surgical intervention .  The patient's history has been reviewed, patient examined today, no change in status, stable for surgery.  I have reviewed the patient's chart and labs.  Questions were answered to the patient's satisfaction.     Adin Hector

## 2013-09-23 NOTE — Anesthesia Postprocedure Evaluation (Signed)
Anesthesia Post Note  Patient: [name]  Procedure(s) Performed: Procedure(s) (LRB): LEFT LUMPECTOMY WITH SPECIMEN MAMMOGRAM (Left)  Anesthesia type: General  Patient location: PACU  Post pain: Pain level controlled and Adequate analgesia  Post assessment: Post-op Vital signs reviewed, Patient's Cardiovascular Status Stable, Respiratory Function Stable, Patent Airway and Pain level controlled  Last Vitals:  Filed Vitals:   09/23/13 0945  BP: 152/84  Pulse: 83  Temp:   Resp: 16    Post vital signs: Reviewed and stable  Level of consciousness: awake, alert  and oriented  Complications: No apparent anesthesia complications

## 2013-09-23 NOTE — Op Note (Signed)
Patient Name:           [name]   Date of Surgery:        09/23/2013  Pre op Diagnosis:     1)    Palpable mass left breast, 9 o' clock position.  Fat necrosis by recent MRI guided biopsy. Cannot exclude underlying malignancy, especially considering history of left breast cancer.                                     2)    History left breast cancer, stage I, treated in 2002 with surgery, radiation therapy, FAC chemotherapy, and adjuvant therapy.    Post op Diagnosis:    Same  Procedure:                 Left breast lumpectomy, 9:00 position,With margin assessment  Surgeon:                     Edsel Petrin. Dalbert Batman, M.D., FACS  Assistant:                     none  Operative Indications:  This patient  underwent left partial mastectomy and sentinel node biopsy in 2002 for a small cancer in the upper outer quadrant. Stage T1c., N0. Port-A-Cath was inserted. She received chemotherapy. She received radiation therapy and aromatase inhibitor therapy. BSO was performed. Port-A-Cath has been removed.  She had a back operation and states that she laid prone on the operating table. Wonders if she injured her breast, but no history of pain or bruise. She recently noticed a painless lump in her left breast medial to the areola. Mammograms didn't show much but MRI showed an enhancing mass thought to be somewhat suspicious for malignancy. Fat necrosis or postradiation changes were also thought to be possible. MRI guided biopsy showed fat necrosis. MRI in 6 months was recommended. the patient is concerned and would like a more definitive answer considering her past history of breast cancer. My examination reveals a very firm, slightly mobile elliptical shaped mass at the 9:00 position the left breast. Overlying skin is healthy.   Operative Findings:       At the 9:00 position of left breast there was a localized area of of dense fibrosis and thickening. This seemed to be more like fat necrosis or severe  fibrosis, and less like cancer. I sent a specimen to the lab that was about 4 cm transversely by 2 cm vertically.  Procedure in Detail:          Following the induction of general LMA anesthesia the patient's left breast was prepped and draped in sterile fashion. Surgical time out was performed. Antibiotics were given. 0.5% Marcaine with epinephrine was used as local infiltration anesthetic. A transverse, radially oriented incision was made in the left breast 9:00 position. Dissection was carried down into the breast tissue and around the palpable mass. I probably resected greater than 90% of this mass. It was marked with silk sutures to orient the pathologist for margins. Specimen mammogram showed the marker clip from the recent biopsy to be present. Specimen was sent for routine histology. Hemostasis was excellent and achieved with electrocautery. The wound was irrigated was saline. The deeper breast tissues were closed with interrupted sutures of 3-0 Vicryl and the skin closed with a running subcuticular suture of 4-0 Monocryl and Dermabond. Breast binder  was placed. The patient was taken to PACU in stable condition. EBL 10 cc. Counts correct. Complications none.     Edsel Petrin. Dalbert Batman, M.D., FACS General and Minimally Invasive Surgery Breast and Colorectal Surgery  09/23/2013 9:22 AM

## 2013-09-23 NOTE — Transfer of Care (Signed)
Immediate Anesthesia Transfer of Care Note  Patient: [name]  Procedure(s) Performed: Procedure(s): LEFT LUMPECTOMY WITH SPECIMEN MAMMOGRAM (Left)  Patient Location: PACU  Anesthesia Type:General  Level of Consciousness: awake and patient cooperative  Airway & Oxygen Therapy: Patient Spontanous Breathing and Patient connected to face mask oxygen  Post-op Assessment: Report given to PACU RN and Post -op Vital signs reviewed and stable  Post vital signs: Reviewed and stable  Complications: No apparent anesthesia complications

## 2013-09-23 NOTE — Discharge Instructions (Signed)
[facility] °Office Phone Number [phone] ° °BREAST BIOPSY/ PARTIAL MASTECTOMY: POST OP INSTRUCTIONS ° °Always review your discharge instruction sheet given to you by the facility where your surgery was performed. ° °IF YOU HAVE DISABILITY OR FAMILY LEAVE FORMS, YOU MUST BRING THEM TO THE OFFICE FOR PROCESSING.  DO NOT GIVE THEM TO YOUR DOCTOR. ° °1. A prescription for pain medication may be given to you upon discharge.  Take your pain medication as prescribed, if needed.  If narcotic pain medicine is not needed, then you may take acetaminophen (Tylenol) or ibuprofen (Advil) as needed. °2. Take your usually prescribed medications unless otherwise directed °3. If you need a refill on your pain medication, please contact your pharmacy.  They will contact our office to request authorization.  Prescriptions will not be filled after 5pm or on week-ends. °4. You should eat very light the first 24 hours after surgery, such as soup, crackers, pudding, etc.  Resume your normal diet the day after surgery. °5. Most patients will experience some swelling and bruising in the breast.  Ice packs and a good support bra will help.  Swelling and bruising can take several days to resolve.  °6. It is common to experience some constipation if taking pain medication after surgery.  Increasing fluid intake and taking a stool softener will usually help or prevent this problem from occurring.  A mild laxative (Milk of Magnesia or Miralax) should be taken according to package directions if there are no bowel movements after 48 hours. °7. Unless discharge instructions indicate otherwise, you may remove your bandages 24-48 hours after surgery, and you may shower at that time.  You may have steri-strips (small skin tapes) in place directly over the incision.  These strips should be left on the skin for 7-10 days.  If your surgeon used skin glue on the incision, you may shower in 24 hours.  The glue will flake off over the  next 2-3 weeks.  Any sutures or staples will be removed at the office during your follow-up visit. °8. ACTIVITIES:  You may resume regular daily activities (gradually increasing) beginning the next day.  Wearing a good support bra or sports bra minimizes pain and swelling.  You may have sexual intercourse when it is comfortable. °a. You may drive when you no longer are taking prescription pain medication, you can comfortably wear a seatbelt, and you can safely maneuver your car and apply brakes. °b. RETURN TO WORK:  ______________________________________________________________________________________ °9. You should see your doctor in the office for a follow-up appointment approximately two weeks after your surgery.  Your doctor’s nurse will typically make your follow-up appointment when she calls you with your pathology report.  Expect your pathology report 2-3 business days after your surgery.  You may call to check if you do not hear from us after three days. °10. OTHER INSTRUCTIONS: _______________________________________________________________________________________________ _____________________________________________________________________________________________________________________________________ °_____________________________________________________________________________________________________________________________________ °_____________________________________________________________________________________________________________________________________ ° °WHEN TO CALL YOUR DOCTOR: °1. Fever over 101.0 °2. Nausea and/or vomiting. °3. Extreme swelling or bruising. °4. Continued bleeding from incision. °5. Increased pain, redness, or drainage from the incision. ° °The clinic staff is available to answer your questions during regular business hours.  Please don’t hesitate to call and ask to speak to one of the nurses for clinical concerns.  If you have a medical emergency, go to the nearest  emergency room or call 911.  A surgeon from [facility] is always on call at the hospital. ° °For further questions, please visit [email or web address]  ° ° °  Post Anesthesia Home Care Instructions ° °Activity: °Get plenty of rest for the remainder of the day. A responsible adult should stay with you for 24 hours following the procedure.  °For the next 24 hours, DO NOT: °-Drive a car °-Operate machinery °-Drink alcoholic beverages °-Take any medication unless instructed by your physician °-Make any legal decisions or sign important papers. ° °Meals: °Start with liquid foods such as gelatin or soup. Progress to regular foods as tolerated. Avoid greasy, spicy, heavy foods. If nausea and/or vomiting occur, drink only clear liquids until the nausea and/or vomiting subsides. Call your physician if vomiting continues. ° °Special Instructions/Symptoms: °Your throat may feel dry or sore from the anesthesia or the breathing tube placed in your throat during surgery. If this causes discomfort, gargle with warm salt water. The discomfort should disappear within 24 hours. ° °

## 2013-09-23 NOTE — Anesthesia Preprocedure Evaluation (Signed)
Anesthesia Evaluation  Patient identified by MRN, date of birth, ID band Patient awake    Reviewed: Allergy & Precautions, H&P , NPO status , Patient's Chart, lab work & pertinent test results  History of Anesthesia Complications (+) PONV  Airway Mallampati: II  Neck ROM: full    Dental   Pulmonary asthma ,          Cardiovascular hypertension, +CHF + dysrhythmias Atrial Fibrillation     Neuro/Psych  Headaches,    GI/Hepatic GERD-  ,  Endo/Other  diabetes, Type 2  Renal/GU      Musculoskeletal  (+) Arthritis -,   Abdominal   Peds  Hematology   Anesthesia Other Findings   Reproductive/Obstetrics                           Anesthesia Physical Anesthesia Plan  ASA: III  Anesthesia Plan: General   Post-op Pain Management:    Induction: Intravenous  Airway Management Planned: LMA  Additional Equipment:   Intra-op Plan:   Post-operative Plan:   Informed Consent: I have reviewed the patients History and Physical, chart, labs and discussed the procedure including the risks, benefits and alternatives for the proposed anesthesia with the patient or authorized representative who has indicated his/her understanding and acceptance.     Plan Discussed with: CRNA, Anesthesiologist and Surgeon  Anesthesia Plan Comments:         Anesthesia Quick Evaluation

## 2013-09-23 NOTE — Anesthesia Procedure Notes (Signed)
Procedure Name: LMA Insertion Date/Time: 09/23/2013 8:50 AM Performed by: Kaoru Rezendes Pre-anesthesia Checklist: Patient identified, Emergency Drugs available, Suction available and Patient being monitored Patient Re-evaluated:Patient Re-evaluated prior to inductionOxygen Delivery Method: Circle System Utilized Preoxygenation: Pre-oxygenation with 100% oxygen Intubation Type: IV induction Ventilation: Mask ventilation without difficulty LMA: LMA inserted LMA Size: 4.0 Number of attempts: 1 Airway Equipment and Method: bite block Placement Confirmation: positive ETCO2 Tube secured with: Tape Dental Injury: Teeth and Oropharynx as per pre-operative assessment

## 2013-09-24 ENCOUNTER — Encounter (HOSPITAL_BASED_OUTPATIENT_CLINIC_OR_DEPARTMENT_OTHER): Payer: Self-pay | Admitting: General Surgery

## 2013-09-24 NOTE — Progress Notes (Signed)
Quick Note:  Inform patient of Pathology report,.The pathology report shows benign fat necrosis, which is a type of scar. There is no cancer. We can discuss this further after her first office visit. ______

## 2013-09-26 ENCOUNTER — Telehealth (INDEPENDENT_AMBULATORY_CARE_PROVIDER_SITE_OTHER): Payer: Self-pay | Admitting: General Surgery

## 2013-09-26 NOTE — Telephone Encounter (Signed)
Pt called for path report and gave her Dr. Darrel Hoover note.  She understands.

## 2013-10-24 ENCOUNTER — Encounter (INDEPENDENT_AMBULATORY_CARE_PROVIDER_SITE_OTHER): Payer: Self-pay | Admitting: General Surgery

## 2013-10-24 ENCOUNTER — Ambulatory Visit (INDEPENDENT_AMBULATORY_CARE_PROVIDER_SITE_OTHER): Payer: Federal, State, Local not specified - PPO | Admitting: General Surgery

## 2013-10-24 VITALS — BP 128/74 | HR 78 | Temp 97.6°F | Resp 14 | Ht 62.0 in | Wt 156.0 lb

## 2013-10-24 DIAGNOSIS — Z853 Personal history of malignant neoplasm of breast: Secondary | ICD-10-CM

## 2013-10-24 DIAGNOSIS — N6320 Unspecified lump in the left breast, unspecified quadrant: Secondary | ICD-10-CM

## 2013-10-24 DIAGNOSIS — N63 Unspecified lump in unspecified breast: Secondary | ICD-10-CM

## 2013-10-24 NOTE — Patient Instructions (Signed)
Your left breast biopsy showed benign fat necrosis. This is a type of scarring, but there is no evidence of cancer.  The wound appears to be healing normally. This will soften up and smooth out over the next 6 months or so.  Continue regular followup with Dr. Marin Olp  Get annual mammograms  Return to see Dr. Dalbert Batman if necessary.

## 2013-10-24 NOTE — Progress Notes (Signed)
Patient ID: [name], female   DOB: Apr 15, 1956, 58 y.o.   MRN: [id number] History: This patient underwent left partial mastectomy and sentinel node biopsy in 2002 for a small cancer in the upper outer quadrant. Stage T1c., N0. Port-A-Cath was inserted. She received chemotherapy. She received radiation therapy and aromatase inhibitor therapy. BSO was performed. Port-A-Cath has been removed.  She had a back operation and states that she laid prone on the operating table. Wonders if she injured her breast, but no history of pain or bruise. She recently noticed a painless lump in her left breast medial to the areola. Mammograms didn't show much but MRI showed an enhancing mass thought to be somewhat suspicious for malignancy. Fat necrosis or postradiation changes were also thought to be possible. MRI guided biopsy showed fat necrosis. MRI in 6 months was recommended. Exam revealed a palpable thickening. Because of her history of cancer she wanted a biopsy. She was taken to the operating room on 09/23/2013 and underwent left breast lumpectomy. Final pathology report shows benign fat necrosis. She is having no problems.  Exam:   patient looks well. No distress. Transverse breast  incision medially is healing normally. Normal amount of thickening. No infection. No seroma.  Assessment: Necrosis left breast, medial, recovering uneventfully following conservative excisional biopsy History left partial mastectomy and sentinel node biopsy 2002 for stage TI C., N0 cancer. No evidence of local recurrence  Plan: She will continue to get annual mammograms She sees Dr. Burney Gauze regularly, and so at this point, she will graduate from my care and followup with me as needed.   Edsel Petrin. Dalbert Batman, M.D., [facility] General and Minimally invasive Surgery Breast and Colorectal Surgery Office:   [phone] Pager:   [phone]

## 2014-04-18 ENCOUNTER — Encounter: Payer: Self-pay | Admitting: Hematology & Oncology

## 2014-04-18 ENCOUNTER — Other Ambulatory Visit (HOSPITAL_BASED_OUTPATIENT_CLINIC_OR_DEPARTMENT_OTHER): Payer: Federal, State, Local not specified - PPO | Admitting: Lab

## 2014-04-18 ENCOUNTER — Other Ambulatory Visit: Payer: Self-pay

## 2014-04-18 ENCOUNTER — Ambulatory Visit (HOSPITAL_BASED_OUTPATIENT_CLINIC_OR_DEPARTMENT_OTHER): Payer: Federal, State, Local not specified - PPO | Admitting: Hematology & Oncology

## 2014-04-18 VITALS — BP 133/72 | HR 87 | Temp 98.1°F | Resp 14 | Ht 62.0 in | Wt 159.0 lb

## 2014-04-18 DIAGNOSIS — Z79811 Long term (current) use of aromatase inhibitors: Secondary | ICD-10-CM

## 2014-04-18 DIAGNOSIS — C50912 Malignant neoplasm of unspecified site of left female breast: Secondary | ICD-10-CM

## 2014-04-18 DIAGNOSIS — Z170 Estrogen receptor positive status [ER+]: Secondary | ICD-10-CM

## 2014-04-18 LAB — CMP (CANCER CENTER ONLY)
ALT: 28 U/L (ref 10–47)
AST: 31 U/L (ref 11–38)
Albumin: 3.8 g/dL (ref 3.3–5.5)
Alkaline Phosphatase: 46 U/L (ref 26–84)
BILIRUBIN TOTAL: 0.5 mg/dL (ref 0.20–1.60)
BUN: 12 mg/dL (ref 7–22)
CO2: 23 meq/L (ref 18–33)
CREATININE: 1 mg/dL (ref 0.6–1.2)
Calcium: 9.2 mg/dL (ref 8.0–10.3)
Chloride: 110 mEq/L — ABNORMAL HIGH (ref 98–108)
Glucose, Bld: 179 mg/dL — ABNORMAL HIGH (ref 73–118)
Potassium: 3.8 mEq/L (ref 3.3–4.7)
Sodium: 146 mEq/L — ABNORMAL HIGH (ref 128–145)
Total Protein: 7.2 g/dL (ref 6.4–8.1)

## 2014-04-18 LAB — CBC WITH DIFFERENTIAL (CANCER CENTER ONLY)
BASO#: 0 10*3/uL (ref 0.0–0.2)
BASO%: 0.5 % (ref 0.0–2.0)
EOS%: 5.2 % (ref 0.0–7.0)
Eosinophils Absolute: 0.4 10*3/uL (ref 0.0–0.5)
HCT: 30.9 % — ABNORMAL LOW (ref 34.8–46.6)
HGB: 10.4 g/dL — ABNORMAL LOW (ref 11.6–15.9)
LYMPH#: 1.8 10*3/uL (ref 0.9–3.3)
LYMPH%: 24.1 % (ref 14.0–48.0)
MCH: 30.5 pg (ref 26.0–34.0)
MCHC: 33.7 g/dL (ref 32.0–36.0)
MCV: 91 fL (ref 81–101)
MONO#: 0.4 10*3/uL (ref 0.1–0.9)
MONO%: 5.4 % (ref 0.0–13.0)
NEUT#: 4.8 10*3/uL (ref 1.5–6.5)
NEUT%: 64.8 % (ref 39.6–80.0)
PLATELETS: 251 10*3/uL (ref 145–400)
RBC: 3.41 10*6/uL — ABNORMAL LOW (ref 3.70–5.32)
RDW: 14.1 % (ref 11.1–15.7)
WBC: 7.3 10*3/uL (ref 3.9–10.0)

## 2014-04-18 LAB — LACTATE DEHYDROGENASE: LDH: 155 U/L (ref 94–250)

## 2014-04-20 NOTE — Progress Notes (Signed)
Hematology and Oncology Follow Up Visit  [name] [id number] [birth year] 58 y.o. 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  Current Therapy:    Observation     Interim History:  Ms.  Brunkhorst is back for followup. She had extensive spinal surgery done. This was done back in November of 2014. She had decompression from L1-S1. She had fusion from T9-S1. She had surgery for about 12 hours.   She got through this. Other she has some physical therapy outwards. She feels pretty good. She is immature. She has some chronic back discomfort but this is much better. She's had no change in bowel or bladder habits. There's been no leg swelling. She's had no rashes. Patient does have diabetes. She has is under fairly good control.  She is on Xarelto for chronic atrial fibrillation. She's had no problems with this.  She did have a breast biopsy of the right breast back in February. There was some question of abnormalities on her mammogram., The breast biopsy only showed fat necrosis. This certainly could come from her spinal surgery when she was on her stomach for 12 hours.  Medications: Current outpatient prescriptions:albuterol (PROVENTIL HFA;VENTOLIN HFA) 108 (90 BASE) MCG/ACT inhaler, Inhale 2 puffs into the lungs as needed for wheezing or shortness of breath. , Disp: , Rfl: ;  carvedilol (COREG) 6.25 MG tablet, Take 6.25 mg by mouth 2 (two) times daily with a meal. , Disp: , Rfl: ;  flecainide (TAMBOCOR) 50 MG tablet, Take 50 mg by mouth 2 (two) times daily. , Disp: , Rfl:  fluticasone (FLONASE) 50 MCG/ACT nasal spray, Place 2 sprays into the nose daily as needed for rhinitis or allergies., Disp: , Rfl: ;  glipiZIDE (GLUCOTROL XL) 5 MG 24 hr tablet, Take 5-10 mg by mouth 2 (two) times daily. 2 tablets every morning and 1 tablet every evening, Disp: , Rfl: ;  HYDROcodone-acetaminophen (NORCO) 10-325 MG per tablet, Take 1-2 tablets by  mouth as needed., Disp: , Rfl:  lisinopril-hydrochlorothiazide (PRINZIDE,ZESTORETIC) 10-12.5 MG per tablet, Take 1 tablet by mouth daily. , Disp: , Rfl: ;  loratadine (CLARITIN) 10 MG tablet, Take 10 mg by mouth daily as needed for allergies., Disp: , Rfl: ;  metFORMIN (GLUCOPHAGE) 1000 MG tablet, Take 1,000 mg by mouth 2 (two) times daily with a meal., Disp: , Rfl: ;  methocarbamol (ROBAXIN) 500 MG tablet, Take 500 mg by mouth as needed for muscle spasms., Disp: , Rfl:  nitrofurantoin (MACRODANTIN) 50 MG capsule, Take 50 mg by mouth at bedtime. , Disp: , Rfl: ;  omeprazole (PRILOSEC) 20 MG capsule, Take 20 mg by mouth. , Disp: , Rfl: ;  ondansetron (ZOFRAN) 4 MG tablet, Take 1 tablet (4 mg total) by mouth every 8 (eight) hours as needed for nausea or vomiting., Disp: 50 tablet, Rfl: 0;  rosuvastatin (CRESTOR) 10 MG tablet, Take 10 mg by mouth at bedtime., Disp: , Rfl:  sitaGLIPtin (JANUVIA) 100 MG tablet, Take 100 mg by mouth daily., Disp: , Rfl: ;  XARELTO 20 MG TABS tablet, Take 20 mg by mouth daily. , Disp: , Rfl:   Allergies:  Allergies  Allergen Reactions  . Other Nausea And Vomiting and Swelling    Shrimp if eat a lot  . Cleocin [Clindamycin Hcl] Other (See Comments)    "irritated my esophagus"  . Morphine And Related Nausea And Vomiting  . Pneumococcal Vaccines Other (See Comments)    Really  high fever, and flu symptoms. MD instructed not to give    Past Medical History, Surgical history, Social history, and Family History were reviewed and updated.  Review of Systems: As above  Physical Exam:  height is 5\' 2"  (1.575 m) and weight is 159 lb (72.122 kg). Her oral temperature is 98.1 F (36.7 C). Her blood pressure is 133/72 and her pulse is 87. Her respiration is 14.   Well-developed and well-nourished white female in no obvious distress. Head and neck exam shows no ocular or oral lesions. She has no palpable cervical or supraclavicular lymph nodes. Lungs are clear. Cardiac exam  regular rate and rhythm with a normal S1 and S2. There are no murmurs, rubs or bruits. Breast exam shows right breast with biopsy site at the 1:00 position. No obvious mass is noted in the right breast. There is no right axillary adenopathy. Left breast shows a well-healed lumpectomy at the 2:00 position. She has some radiation to education done at the lumpectomy site. The left breast is contracted. She has some firmness from radiation. No distinct masses noted in the left breast. There is no left axillary adenopathy. Abdomen is soft. She has good bowel sounds. There is no fluid wave. There is no palpable liver or spleen tip. Back exam shows no tenderness over the spine, ribs or hips. Extremities shows no clubbing, cyanosis or edema. There is no lymphedema of the left arm.  Lab Results  Component Value Date   WBC 7.3 04/18/2014   HGB 10.4* 04/18/2014   HCT 30.9* 04/18/2014   MCV 91 04/18/2014   PLT 251 04/18/2014     Chemistry      Component Value Date/Time   NA 146* 04/18/2014 1027   NA 144 09/18/2013 1200   K 3.8 04/18/2014 1027   K 3.7 09/18/2013 1200   CL 110* 04/18/2014 1027   CL 102 09/18/2013 1200   CO2 23 04/18/2014 1027   CO2 25 09/18/2013 1200   BUN 12 04/18/2014 1027   BUN 18 09/18/2013 1200   CREATININE 1.0 04/18/2014 1027   CREATININE 0.89 09/18/2013 1200      Component Value Date/Time   CALCIUM 9.2 04/18/2014 1027   CALCIUM 10.1 09/18/2013 1200   ALKPHOS 46 04/18/2014 1027   ALKPHOS 70 09/18/2013 1200   AST 31 04/18/2014 1027   AST 23 09/18/2013 1200   ALT 28 04/18/2014 1027   ALT 20 09/18/2013 1200   BILITOT 0.50 04/18/2014 1027   BILITOT 0.3 09/18/2013 1200         Impression and Plan: [name] is a 58 year old white female. She has a stage I ductal carcinoma of the left breast. She now is 12 years out. Her tumor was ER positive. She did have an oophorectomy. She was given chemotherapy. She also is on aromatase inhibitor therapy.  I'll plan to see her back in  another year. I will let her spine surgery when as well as it did.  Is in about 30 minutes with her today.   [name], MD 10/18/20151:05 PM

## 2014-04-28 ENCOUNTER — Other Ambulatory Visit: Payer: Self-pay | Admitting: *Deleted

## 2014-04-28 ENCOUNTER — Other Ambulatory Visit: Payer: Self-pay

## 2014-04-28 DIAGNOSIS — C50912 Malignant neoplasm of unspecified site of left female breast: Secondary | ICD-10-CM

## 2014-04-30 ENCOUNTER — Other Ambulatory Visit: Payer: Self-pay | Admitting: *Deleted

## 2014-04-30 DIAGNOSIS — C50919 Malignant neoplasm of unspecified site of unspecified female breast: Secondary | ICD-10-CM

## 2014-05-12 ENCOUNTER — Other Ambulatory Visit: Payer: Self-pay

## 2014-05-12 DIAGNOSIS — Z1231 Encounter for screening mammogram for malignant neoplasm of breast: Secondary | ICD-10-CM

## 2014-05-12 DIAGNOSIS — Z853 Personal history of malignant neoplasm of breast: Secondary | ICD-10-CM

## 2014-05-16 HISTORY — PX: COLONOSCOPY: SHX174

## 2014-06-03 ENCOUNTER — Ambulatory Visit
Admission: RE | Admit: 2014-06-03 | Discharge: 2014-06-03 | Disposition: A | Discharge status: Home / Self Care | Payer: Federal, State, Local not specified - PPO | Source: Ambulatory Visit

## 2014-06-03 ENCOUNTER — Ambulatory Visit
Admission: RE | Admit: 2014-06-03 | Discharge: 2014-06-03 | Disposition: A | Discharge status: Home / Self Care | Payer: Federal, State, Local not specified - PPO | Source: Ambulatory Visit | Attending: Hematology & Oncology | Admitting: Hematology & Oncology

## 2014-06-03 DIAGNOSIS — C50919 Malignant neoplasm of unspecified site of unspecified female breast: Secondary | ICD-10-CM

## 2014-06-03 DIAGNOSIS — Z853 Personal history of malignant neoplasm of breast: Secondary | ICD-10-CM

## 2014-06-03 DIAGNOSIS — Z1231 Encounter for screening mammogram for malignant neoplasm of breast: Secondary | ICD-10-CM

## 2014-06-03 MED ORDER — GADOBENATE DIMEGLUMINE 529 MG/ML IV SOLN
14.0000 mL | Freq: Once | INTRAVENOUS | Status: AC | PRN
  Administered 2014-06-03: 14 mL via INTRAVENOUS

## 2014-11-20 DIAGNOSIS — N390 Urinary tract infection, site not specified: Secondary | ICD-10-CM

## 2014-11-20 HISTORY — DX: Urinary tract infection, site not specified: N39.0

## 2014-12-19 ENCOUNTER — Other Ambulatory Visit: Payer: Self-pay | Admitting: Hematology & Oncology

## 2014-12-19 DIAGNOSIS — N6320 Unspecified lump in the left breast, unspecified quadrant: Secondary | ICD-10-CM

## 2014-12-29 ENCOUNTER — Other Ambulatory Visit: Payer: Self-pay

## 2014-12-30 ENCOUNTER — Ambulatory Visit
Admission: RE | Admit: 2014-12-30 | Discharge: 2014-12-30 | Disposition: A | Discharge status: Home / Self Care | Payer: Federal, State, Local not specified - PPO | Source: Ambulatory Visit | Attending: Hematology & Oncology | Admitting: Hematology & Oncology

## 2014-12-30 DIAGNOSIS — N6320 Unspecified lump in the left breast, unspecified quadrant: Secondary | ICD-10-CM

## 2015-03-12 ENCOUNTER — Ambulatory Visit
Admission: RE | Admit: 2015-03-12 | Discharge: 2015-03-12 | Disposition: A | Discharge status: Home / Self Care | Payer: Federal, State, Local not specified - PPO | Source: Ambulatory Visit | Attending: Physician Assistant | Admitting: Physician Assistant

## 2015-03-12 ENCOUNTER — Other Ambulatory Visit: Payer: Self-pay | Admitting: Physician Assistant

## 2015-03-12 DIAGNOSIS — S42141A Displaced fracture of glenoid cavity of scapula, right shoulder, initial encounter for closed fracture: Secondary | ICD-10-CM

## 2015-03-12 DIAGNOSIS — S42151A Displaced fracture of neck of scapula, right shoulder, initial encounter for closed fracture: Principal | ICD-10-CM

## 2015-04-17 ENCOUNTER — Other Ambulatory Visit (HOSPITAL_BASED_OUTPATIENT_CLINIC_OR_DEPARTMENT_OTHER): Payer: Federal, State, Local not specified - PPO

## 2015-04-17 ENCOUNTER — Ambulatory Visit (HOSPITAL_BASED_OUTPATIENT_CLINIC_OR_DEPARTMENT_OTHER): Payer: Federal, State, Local not specified - PPO | Admitting: Family

## 2015-04-17 ENCOUNTER — Encounter: Payer: Self-pay | Admitting: Family

## 2015-04-17 VITALS — BP 124/78 | HR 88 | Temp 98.5°F | Resp 16 | Ht 62.0 in | Wt 156.0 lb

## 2015-04-17 DIAGNOSIS — C50912 Malignant neoplasm of unspecified site of left female breast: Secondary | ICD-10-CM | POA: Diagnosis not present

## 2015-04-17 LAB — CBC WITH DIFFERENTIAL (CANCER CENTER ONLY)
BASO#: 0.1 10*3/uL (ref 0.0–0.2)
BASO%: 0.8 % (ref 0.0–2.0)
EOS%: 10 % — AB (ref 0.0–7.0)
Eosinophils Absolute: 0.9 10*3/uL — ABNORMAL HIGH (ref 0.0–0.5)
HCT: 32.1 % — ABNORMAL LOW (ref 34.8–46.6)
HGB: 10.5 g/dL — ABNORMAL LOW (ref 11.6–15.9)
LYMPH#: 2.1 10*3/uL (ref 0.9–3.3)
LYMPH%: 23.4 % (ref 14.0–48.0)
MCH: 30.3 pg (ref 26.0–34.0)
MCHC: 32.7 g/dL (ref 32.0–36.0)
MCV: 93 fL (ref 81–101)
MONO#: 0.4 10*3/uL (ref 0.1–0.9)
MONO%: 4.1 % (ref 0.0–13.0)
NEUT#: 5.4 10*3/uL (ref 1.5–6.5)
NEUT%: 61.7 % (ref 39.6–80.0)
Platelets: 211 10*3/uL (ref 145–400)
RBC: 3.46 10*6/uL — ABNORMAL LOW (ref 3.70–5.32)
RDW: 14.6 % (ref 11.1–15.7)
WBC: 8.8 10*3/uL (ref 3.9–10.0)

## 2015-04-17 LAB — COMPREHENSIVE METABOLIC PANEL (CC13)
ALBUMIN: 3.9 g/dL (ref 3.5–5.0)
ALK PHOS: 66 U/L (ref 40–150)
ALT: 17 U/L (ref 0–55)
ANION GAP: 11 meq/L (ref 3–11)
AST: 16 U/L (ref 5–34)
BILIRUBIN TOTAL: 0.38 mg/dL (ref 0.20–1.20)
BUN: 22.5 mg/dL (ref 7.0–26.0)
CALCIUM: 9.4 mg/dL (ref 8.4–10.4)
CO2: 21 mEq/L — ABNORMAL LOW (ref 22–29)
CREATININE: 1.1 mg/dL (ref 0.6–1.1)
Chloride: 109 mEq/L (ref 98–109)
EGFR: 53 mL/min/{1.73_m2} — ABNORMAL LOW (ref >90)
Glucose: 115 mg/dl (ref 70–140)
Potassium: 4.1 mEq/L (ref 3.5–5.1)
Sodium: 142 mEq/L (ref 136–145)
TOTAL PROTEIN: 7.1 g/dL (ref 6.4–8.3)

## 2015-04-17 NOTE — Progress Notes (Signed)
Hematology and Oncology Follow Up Visit  [name] [id number] [birth year] 59 y.o. 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 She is doing well and has no complaints at this time. She had her most recent mammogram in June which showed no evidence of malignancy. She is scheduled for a breast MRI at the end of the month.  She has noticed no changes with her chest. No lymphadenopathy found on exam.  She is now off of Xarelto. She was cleared by cardiology and is now on Coreg and Tambocor. She has had no episodes of bleeding or bruising.   She is eating well and staying hydrated.  She still has issues with her back and unfortunately fell twice recently injuring her shoulder. She is now receiving PT for the right shoulder. Thankfully she did not tear ger rotator cuff.  Due to her back issues she does have some numbness and tingling in her hands and feet.  She is using a cane to ambulate and is doing better.  She denies fever, chills, n/v, cough, rash, dizziness, SOB, chest pain, abdominal pain or changes in bowel or bladder habits.   Medications:    Medication List       This list is accurate as of: 04/17/15  1:17 PM.  Always use your most recent med list.               albuterol 108 (90 BASE) MCG/ACT inhaler  Commonly known as:  PROVENTIL HFA;VENTOLIN HFA  Inhale 2 puffs into the lungs as needed for wheezing or shortness of breath.     atorvastatin 10 MG tablet  Commonly known as:  LIPITOR  TAKE 1 TABLET ONCE A DAY (AT BEDTIME)     carvedilol 6.25 MG tablet  Commonly known as:  COREG  Take 6.25 mg by mouth 2 (two) times daily with a meal.     cyanocobalamin 1000 MCG/ML injection  Commonly known as:  (VITAMIN B-12)  INJECT 1 ML SUBCUTANEOUSLY EVERY MINTH     flecainide 50 MG tablet  Commonly  known as:  TAMBOCOR  Take 50 mg by mouth 2 (two) times daily.     fluticasone 50 MCG/ACT nasal spray  Commonly known as:  FLONASE  Place 2 sprays into the nose daily as needed for rhinitis or allergies.     glipiZIDE 5 MG 24 hr tablet  Commonly known as:  GLUCOTROL XL  Take 5-10 mg by mouth 2 (two) times daily. 2 tablets every morning and 1 tablet every evening     HYDROcodone-acetaminophen 10-325 MG tablet  Commonly known as:  NORCO  Take 1-2 tablets by mouth as needed.     lisinopril-hydrochlorothiazide 10-12.5 MG tablet  Commonly known as:  PRINZIDE,ZESTORETIC  Take 1 tablet by mouth daily.     loratadine 10 MG tablet  Commonly known as:  CLARITIN  Take 10 mg by mouth daily as needed for allergies.     metFORMIN 1000 MG tablet  Commonly known as:  GLUCOPHAGE  Take 1,000 mg by mouth 2 (two) times daily with a meal.     methocarbamol 500 MG tablet  Commonly known as:  ROBAXIN  Take 500 mg by mouth as needed for muscle spasms.     nitrofurantoin 50 MG capsule  Commonly known as:  MACRODANTIN  Take 50 mg by mouth at bedtime.     omeprazole 20 MG capsule  Commonly known as:  PRILOSEC  Take 20 mg by mouth.     ondansetron 4 MG tablet  Commonly known as:  ZOFRAN  Take 1 tablet (4 mg total) by mouth every 8 (eight) hours as needed for nausea or vomiting.     sitaGLIPtin 100 MG tablet  Commonly known as:  JANUVIA  Take 100 mg by mouth daily.        Allergies:  Allergies  Allergen Reactions  . Other Nausea And Vomiting and Swelling    Shrimp if eat a lot  . Cleocin [Clindamycin Hcl] Other (See Comments)    "irritated my esophagus"  . Morphine And Related Nausea And Vomiting  . Pneumococcal Vaccines Other (See Comments)    Really high fever, and flu symptoms. MD instructed not to give    Past Medical History, Surgical history, Social history, and Family History were reviewed and updated.  Review of Systems: All other 10 point review of systems is negative.    Physical Exam:  height is 5\' 2"  (1.575 m) and weight is 156 lb (70.761 kg). Her oral temperature is 98.5 F (36.9 C). Her blood pressure is 124/78 and her pulse is 88. Her respiration is 16.   Wt Readings from Last 3 Encounters:  04/17/15 156 lb (70.761 kg)  04/18/14 159 lb (72.122 kg)  10/24/13 156 lb (70.761 kg)    Ocular: Sclerae unicteric, pupils equal, round and reactive to light Ear-nose-throat: Oropharynx clear, dentition fair Lymphatic: No cervical or supraclavicular adenopathy Lungs no rales or rhonchi, good excursion bilaterally Heart regular rate and rhythm, no murmur appreciated Abd soft, nontender, positive bowel sounds MSK no focal spinal tenderness, no joint edema Neuro: non-focal, well-oriented, appropriate affect Breasts: No changes. No mass, lesion, rash of lymphadenopathy on bilateral breast exam.   Lab Results  Component Value Date   WBC 8.8 04/17/2015   HGB 10.5* 04/17/2015   HCT 32.1* 04/17/2015   MCV 93 04/17/2015   PLT 211 04/17/2015   No results found for: FERRITIN, IRON, TIBC, UIBC, IRONPCTSAT Lab Results  Component Value Date   RBC 3.46* 04/17/2015   No results found for: KPAFRELGTCHN, LAMBDASER, KAPLAMBRATIO No results found for: Kandis Cocking, IGMSERUM No results found for: Odetta Pink, SPEI   Chemistry      Component Value Date/Time   NA 142 04/17/2015 1055   NA 146* 04/18/2014 1027   NA 144 09/18/2013 1200   K 4.1 04/17/2015 1055   K 3.8 04/18/2014 1027   K 3.7 09/18/2013 1200   CL 110* 04/18/2014 1027   CL 102 09/18/2013 1200   CO2 21* 04/17/2015 1055   CO2 23 04/18/2014 1027   CO2 25 09/18/2013 1200   BUN 22.5 04/17/2015 1055   BUN 12 04/18/2014 1027   BUN 18 09/18/2013 1200   CREATININE 1.1 04/17/2015 1055   CREATININE 1.0 04/18/2014 1027   CREATININE 0.89 09/18/2013 1200      Component Value Date/Time   CALCIUM 9.4 04/17/2015 1055   CALCIUM 9.2 04/18/2014 1027    CALCIUM 10.1 09/18/2013 1200   ALKPHOS 66 04/17/2015 1055   ALKPHOS 46 04/18/2014 1027   ALKPHOS 70 09/18/2013 1200   AST 16 04/17/2015 1055   AST 31 04/18/2014 1027   AST 23 09/18/2013 1200   ALT 17 04/17/2015 1055   ALT 28 04/18/2014 1027   ALT 20 09/18/2013 1200  BILITOT 0.38 04/17/2015 1055   BILITOT 0.50 04/18/2014 1027   BILITOT 0.3 09/18/2013 1200     Impression and Plan: Ms. Spearman is a 59 year old white female with a history of stage I ductal carcinoma of the left breast which was ER +. She now is 13 years out. She had an oophorectomy at that time. She completed chemotherapy and was on an aromatase inhibitor.  She is doing well and there has been no evidence of recurrence. Her mammogram in June was negative.  She will have an MRI at the end of this month.  We will continue to follow along with her and plan to see her back in 1 year.  She knows to contact us with any questions or concerns. We can certainly see her sooner if need be.   Eliezer Bottom, NP 10/14/20161:17 PM

## 2015-10-02 ENCOUNTER — Other Ambulatory Visit: Payer: Self-pay | Admitting: Orthopedic Surgery

## 2015-10-02 ENCOUNTER — Other Ambulatory Visit: Payer: Self-pay | Admitting: Physician Assistant

## 2015-10-02 ENCOUNTER — Ambulatory Visit
Admission: RE | Admit: 2015-10-02 | Discharge: 2015-10-02 | Disposition: A | Discharge status: Home / Self Care | Payer: Federal, State, Local not specified - PPO | Source: Ambulatory Visit | Attending: Orthopedic Surgery | Admitting: Orthopedic Surgery

## 2015-10-02 DIAGNOSIS — R0781 Pleurodynia: Secondary | ICD-10-CM

## 2015-10-07 ENCOUNTER — Other Ambulatory Visit: Payer: Self-pay

## 2015-10-07 DIAGNOSIS — Z1231 Encounter for screening mammogram for malignant neoplasm of breast: Secondary | ICD-10-CM

## 2015-10-23 ENCOUNTER — Ambulatory Visit
Admission: RE | Admit: 2015-10-23 | Discharge: 2015-10-23 | Disposition: A | Discharge status: Home / Self Care | Payer: Federal, State, Local not specified - PPO | Source: Ambulatory Visit

## 2015-10-23 DIAGNOSIS — Z1231 Encounter for screening mammogram for malignant neoplasm of breast: Secondary | ICD-10-CM

## 2015-10-27 ENCOUNTER — Other Ambulatory Visit: Payer: Self-pay | Admitting: Hematology & Oncology

## 2015-10-27 DIAGNOSIS — R928 Other abnormal and inconclusive findings on diagnostic imaging of breast: Secondary | ICD-10-CM

## 2015-11-02 ENCOUNTER — Ambulatory Visit
Admission: RE | Admit: 2015-11-02 | Discharge: 2015-11-02 | Disposition: A | Discharge status: Home / Self Care | Payer: Federal, State, Local not specified - PPO | Source: Ambulatory Visit | Attending: Hematology & Oncology | Admitting: Hematology & Oncology

## 2015-11-02 ENCOUNTER — Other Ambulatory Visit: Payer: Self-pay | Admitting: Hematology & Oncology

## 2015-11-02 DIAGNOSIS — R928 Other abnormal and inconclusive findings on diagnostic imaging of breast: Secondary | ICD-10-CM

## 2015-11-02 DIAGNOSIS — N6320 Unspecified lump in the left breast, unspecified quadrant: Secondary | ICD-10-CM

## 2015-11-04 ENCOUNTER — Other Ambulatory Visit: Payer: Self-pay | Admitting: Hematology & Oncology

## 2015-11-04 ENCOUNTER — Ambulatory Visit
Admission: RE | Admit: 2015-11-04 | Discharge: 2015-11-04 | Disposition: A | Discharge status: Home / Self Care | Payer: Federal, State, Local not specified - PPO | Source: Ambulatory Visit | Attending: Hematology & Oncology | Admitting: Hematology & Oncology

## 2015-11-04 DIAGNOSIS — N6320 Unspecified lump in the left breast, unspecified quadrant: Secondary | ICD-10-CM

## 2016-04-18 ENCOUNTER — Ambulatory Visit: Payer: Federal, State, Local not specified - PPO | Admitting: Hematology & Oncology

## 2016-04-18 ENCOUNTER — Other Ambulatory Visit (HOSPITAL_BASED_OUTPATIENT_CLINIC_OR_DEPARTMENT_OTHER): Payer: Federal, State, Local not specified - PPO

## 2016-04-18 ENCOUNTER — Encounter: Payer: Self-pay | Admitting: Family

## 2016-04-18 ENCOUNTER — Ambulatory Visit (HOSPITAL_BASED_OUTPATIENT_CLINIC_OR_DEPARTMENT_OTHER): Payer: Federal, State, Local not specified - PPO | Admitting: Family

## 2016-04-18 ENCOUNTER — Other Ambulatory Visit: Payer: Federal, State, Local not specified - PPO

## 2016-04-18 VITALS — BP 133/70 | HR 84 | Temp 98.3°F | Resp 16 | Ht 62.0 in | Wt 155.4 lb

## 2016-04-18 DIAGNOSIS — Z170 Estrogen receptor positive status [ER+]: Principal | ICD-10-CM

## 2016-04-18 DIAGNOSIS — Z853 Personal history of malignant neoplasm of breast: Secondary | ICD-10-CM

## 2016-04-18 DIAGNOSIS — C50912 Malignant neoplasm of unspecified site of left female breast: Secondary | ICD-10-CM

## 2016-04-18 LAB — CBC WITH DIFFERENTIAL (CANCER CENTER ONLY)
BASO#: 0 10*3/uL (ref 0.0–0.2)
BASO%: 0.2 % (ref 0.0–2.0)
EOS%: 2.4 % (ref 0.0–7.0)
Eosinophils Absolute: 0.2 10*3/uL (ref 0.0–0.5)
HEMATOCRIT: 33.9 % — AB (ref 34.8–46.6)
HGB: 10.9 g/dL — ABNORMAL LOW (ref 11.6–15.9)
LYMPH#: 1.8 10*3/uL (ref 0.9–3.3)
LYMPH%: 20 % (ref 14.0–48.0)
MCH: 29.4 pg (ref 26.0–34.0)
MCHC: 32.2 g/dL (ref 32.0–36.0)
MCV: 91 fL (ref 81–101)
MONO#: 0.3 10*3/uL (ref 0.1–0.9)
MONO%: 3.6 % (ref 0.0–13.0)
NEUT%: 73.8 % (ref 39.6–80.0)
NEUTROS ABS: 6.6 10*3/uL — AB (ref 1.5–6.5)
Platelets: 324 10*3/uL (ref 145–400)
RBC: 3.71 10*6/uL (ref 3.70–5.32)
RDW: 14.3 % (ref 11.1–15.7)
WBC: 8.9 10*3/uL (ref 3.9–10.0)

## 2016-04-18 LAB — COMPREHENSIVE METABOLIC PANEL
ALT: 23 U/L (ref 0–55)
AST: 14 U/L (ref 5–34)
Albumin: 3.6 g/dL (ref 3.5–5.0)
Alkaline Phosphatase: 68 U/L (ref 40–150)
Anion Gap: 12 mEq/L — ABNORMAL HIGH (ref 3–11)
BUN: 27.3 mg/dL — AB (ref 7.0–26.0)
CHLORIDE: 110 meq/L — AB (ref 98–109)
CO2: 18 meq/L — AB (ref 22–29)
Calcium: 9.4 mg/dL (ref 8.4–10.4)
Creatinine: 1.4 mg/dL — ABNORMAL HIGH (ref 0.6–1.1)
EGFR: 39 mL/min/{1.73_m2} — AB (ref >90)
GLUCOSE: 159 mg/dL — AB (ref 70–140)
POTASSIUM: 4.4 meq/L (ref 3.5–5.1)
SODIUM: 141 meq/L (ref 136–145)
Total Bilirubin: <0.22 mg/dL (ref 0.20–1.20)
Total Protein: 7 g/dL (ref 6.4–8.3)

## 2016-04-18 LAB — LACTATE DEHYDROGENASE: LDH: 129 U/L (ref 125–245)

## 2016-04-18 NOTE — Progress Notes (Signed)
Hematology and Oncology Follow Up Visit  [name] [id number] 1956-02-24 60 y.o. 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 She has had some fatigue and admits to having been under quite a bit of stress at home. She states that she has a strong faith and a wonderful support system. Unfortunately her daughter's 27 yo husband passed away suddenly several months ago from a PE and she has been helping her with their 4 small children. They are taking things one day at a time.  She had her mammogram and breast biopsy in May of this year. She had what ended up being another fat necrosis with calcifications. She state that this area was removed and she has healed nicely. Her incision at the 8-9 o'clock position of the left breast has healed and she has had no other issues. She has a scar at the original lumpectomy site at the 2 o'clock position. No lymphadenopathy found on exam.  She continues to do self breast exams regularly at home.  She has recently had 2 basal cell carcinoma spots removed, one from her right upper arm and one from the top of her left foot.  She has occasional palpitations on coreg and Tambocor for atrial fib. She follows up with her cardiologist Dr. Jimmie Molly on November 2nd.   No fever, chills, n/v, cough, rash, dizziness, SOB, chest pain, abdominal pain or changes in bowel or bladder habits.  Due to chronic neck and back issues she does have occasional numbness and tingling in her hands and feet. No swelling or tenderness in her extremities. No c/o pain at this time.  She has maintained a good appetite and is staying well hydrated. Her weight is stable.  Her blood sugars have been fairly well controlled. She states that she has occasional IBS on Metformin. This has been an ongoing issue for some time.  She has  had no episodes of bleeding or bruising.   Medications:    Medication List       Accurate as of 04/18/16  1:38 PM. Always use your most recent med list.          albuterol 108 (90 Base) MCG/ACT inhaler Commonly known as:  PROVENTIL HFA;VENTOLIN HFA Inhale 2 puffs into the lungs as needed for wheezing or shortness of breath.   atorvastatin 10 MG tablet Commonly known as:  LIPITOR TAKE 1 TABLET ONCE A DAY (AT BEDTIME)   carvedilol 6.25 MG tablet Commonly known as:  COREG Take 6.25 mg by mouth 2 (two) times daily with a meal.   flecainide 50 MG tablet Commonly known as:  TAMBOCOR Take 50 mg by mouth 2 (two) times daily.   fluticasone 50 MCG/ACT nasal spray Commonly known as:  FLONASE Place 2 sprays into the nose daily as needed for rhinitis or allergies.   glipiZIDE 5 MG 24 hr tablet Commonly known as:  GLUCOTROL XL Take 5-10 mg by mouth 2 (two) times daily. 2 tablets every morning and 1 tablet every evening   lisinopril-hydrochlorothiazide 10-12.5 MG tablet Commonly known as:  PRINZIDE,ZESTORETIC Take 1 tablet by mouth daily.   loratadine 10 MG tablet Commonly known as:  CLARITIN Take 10 mg by mouth daily as needed for allergies.   metFORMIN 1000 MG tablet Commonly known as:  GLUCOPHAGE Take 1,000 mg by mouth 2 (two)  times daily with a meal.   nitrofurantoin 50 MG capsule Commonly known as:  MACRODANTIN Take 50 mg by mouth at bedtime.   omeprazole 20 MG capsule Commonly known as:  PRILOSEC Take 20 mg by mouth.   sitaGLIPtin 100 MG tablet Commonly known as:  JANUVIA Take 100 mg by mouth daily.       Allergies:  Allergies  Allergen Reactions  . Other Nausea And Vomiting and Swelling    Shrimp if eat a lot  . Cleocin [Clindamycin Hcl] Other (See Comments)    "irritated my esophagus"  . Iodine     Other reaction(s): Other (See Comments)  . Morphine And Related Nausea And Vomiting  . Pneumococcal Vaccines Other (See Comments)    Really high fever,  and flu symptoms. MD instructed not to give  . Buprenorphine Hcl Nausea And Vomiting    Past Medical History, Surgical history, Social history, and Family History were reviewed and updated.  Review of Systems: All other 10 point review of systems is negative.   Physical Exam:  height is 5\' 2"  (1.575 m) and weight is 155 lb 6.4 oz (70.5 kg). Her oral temperature is 98.3 F (36.8 C). Her blood pressure is 133/70 and her pulse is 84. Her respiration is 16.   Wt Readings from Last 3 Encounters:  04/18/16 155 lb 6.4 oz (70.5 kg)  04/17/15 156 lb (70.8 kg)  04/18/14 159 lb (72.1 kg)    Ocular: Sclerae unicteric, pupils equal, round and reactive to light Ear-nose-throat: Oropharynx clear, dentition fair Lymphatic: No cervical supraclavicular or axillary adenopathy Lungs no rales or rhonchi, good excursion bilaterally Heart regular rate and rhythm, no murmur appreciated Abd soft, nontender, positive bowel sounds, no liver or spleen tip palpated on exam, no fluid wave MSK no focal spinal tenderness, no joint edema Neuro: non-focal, well-oriented, appropriate affect Breasts: Surgical changes to left breast at 2 o'clock position lumpectomy site as well as the 8-9 o'clock position incision site have healed nicely. No changes to right breast. No mass, lesion, rash of lymphadenopathy on exam.   Lab Results  Component Value Date   WBC 8.8 04/17/2015   HGB 10.5 (L) 04/17/2015   HCT 32.1 (L) 04/17/2015   MCV 93 04/17/2015   PLT 211 04/17/2015   No results found for: FERRITIN, IRON, TIBC, UIBC, IRONPCTSAT Lab Results  Component Value Date   RBC 3.46 (L) 04/17/2015   No results found for: KPAFRELGTCHN, LAMBDASER, KAPLAMBRATIO No results found for: IGGSERUM, IGA, IGMSERUM No results found for: Odetta Pink, SPEI   Chemistry      Component Value Date/Time   NA 142 04/17/2015 1055   K 4.1 04/17/2015 1055   CL 110 (H) 04/18/2014 1027    CO2 21 (L) 04/17/2015 1055   BUN 22.5 04/17/2015 1055   CREATININE 1.1 04/17/2015 1055      Component Value Date/Time   CALCIUM 9.4 04/17/2015 1055   ALKPHOS 66 04/17/2015 1055   AST 16 04/17/2015 1055   ALT 17 04/17/2015 1055   BILITOT 0.38 04/17/2015 1055     Impression and Plan: [name] is a 60 year old white female with a history of stage I ductal carcinoma of the left breast which was ER +. She now is 14 years out. She had an oophorectomy at that time. She completed chemotherapy and was on an aromatase inhibitor. She is now 14 years out and so far, there has been no evidence of recurrence.  She  had her mammogram earlier this year which identified a mass which turned out to be an area of fatty necrosis with calcification. She had this removed and the incision has healed nicely. She continues to do well and performs regular self breast exams.   Her exam today was negative. She has had some mild fatigue but is under some stress at home. She denies feelings of depression and states that she has a great support system and strong faith.  She follows up with cardiology regarding her history of atrial fib and would like her lab work from today sent to their office. I will route everything from today to Dr. Delfin Edis.  We will continue to follow along with her and plan to see her back in 1 year. She will be due again for mammogram in May 2018 and plans to call and set this up herself at that time.  She will contact our office with any questions or concerns. We can certainly see her sooner if need be.   Eliezer Bottom, NP 10/16/20171:38 PM

## 2016-09-08 DIAGNOSIS — J329 Chronic sinusitis, unspecified: Secondary | ICD-10-CM | POA: Insufficient documentation

## 2016-09-08 DIAGNOSIS — T7840XA Allergy, unspecified, initial encounter: Secondary | ICD-10-CM | POA: Insufficient documentation

## 2016-09-08 HISTORY — DX: Chronic sinusitis, unspecified: J32.9

## 2016-09-08 HISTORY — DX: Allergy, unspecified, initial encounter: T78.40XA

## 2016-11-15 ENCOUNTER — Other Ambulatory Visit (INDEPENDENT_AMBULATORY_CARE_PROVIDER_SITE_OTHER): Payer: Federal, State, Local not specified - PPO

## 2016-11-15 ENCOUNTER — Ambulatory Visit (INDEPENDENT_AMBULATORY_CARE_PROVIDER_SITE_OTHER): Payer: Federal, State, Local not specified - PPO | Admitting: Pulmonary Disease

## 2016-11-15 ENCOUNTER — Encounter: Payer: Self-pay | Admitting: Pulmonary Disease

## 2016-11-15 DIAGNOSIS — K219 Gastro-esophageal reflux disease without esophagitis: Secondary | ICD-10-CM | POA: Diagnosis not present

## 2016-11-15 DIAGNOSIS — J410 Simple chronic bronchitis: Secondary | ICD-10-CM

## 2016-11-15 DIAGNOSIS — J45909 Unspecified asthma, uncomplicated: Secondary | ICD-10-CM | POA: Diagnosis not present

## 2016-11-15 DIAGNOSIS — J3089 Other allergic rhinitis: Secondary | ICD-10-CM | POA: Diagnosis not present

## 2016-11-15 DIAGNOSIS — N200 Calculus of kidney: Secondary | ICD-10-CM | POA: Insufficient documentation

## 2016-11-15 DIAGNOSIS — I4891 Unspecified atrial fibrillation: Secondary | ICD-10-CM

## 2016-11-15 DIAGNOSIS — R197 Diarrhea, unspecified: Secondary | ICD-10-CM | POA: Insufficient documentation

## 2016-11-15 DIAGNOSIS — E785 Hyperlipidemia, unspecified: Secondary | ICD-10-CM

## 2016-11-15 HISTORY — DX: Other allergic rhinitis: J30.89

## 2016-11-15 HISTORY — DX: Diarrhea, unspecified: R19.7

## 2016-11-15 HISTORY — DX: Unspecified atrial fibrillation: I48.91

## 2016-11-15 HISTORY — DX: Hyperlipidemia, unspecified: E78.5

## 2016-11-15 HISTORY — DX: Calculus of kidney: N20.0

## 2016-11-15 HISTORY — DX: Simple chronic bronchitis: J41.0

## 2016-11-15 LAB — CBC WITH DIFFERENTIAL/PLATELET
BASOS PCT: 0.5 % (ref 0.0–3.0)
Basophils Absolute: 0.1 10*3/uL (ref 0.0–0.1)
EOS ABS: 0.3 10*3/uL (ref 0.0–0.7)
EOS PCT: 2.8 % (ref 0.0–5.0)
HCT: 35 % — ABNORMAL LOW (ref 36.0–46.0)
HEMOGLOBIN: 11.4 g/dL — AB (ref 12.0–15.0)
LYMPHS PCT: 16 % (ref 12.0–46.0)
Lymphs Abs: 1.9 10*3/uL (ref 0.7–4.0)
MCHC: 32.5 g/dL (ref 30.0–36.0)
MCV: 89 fl (ref 78.0–100.0)
Monocytes Absolute: 0.5 10*3/uL (ref 0.1–1.0)
Monocytes Relative: 4.2 % (ref 3.0–12.0)
NEUTROS ABS: 9 10*3/uL — AB (ref 1.4–7.7)
NEUTROS PCT: 76.5 % (ref 43.0–77.0)
Platelets: 266 10*3/uL (ref 150.0–400.0)
RBC: 3.93 Mil/uL (ref 3.87–5.11)
RDW: 16.5 % — ABNORMAL HIGH (ref 11.5–15.5)
WBC: 11.8 10*3/uL — ABNORMAL HIGH (ref 4.0–10.5)

## 2016-11-15 MED ORDER — MONTELUKAST SODIUM 10 MG PO TABS: 10.0000 mg | ORAL_TABLET | Freq: Every day | ORAL | 3 refills | Status: DC

## 2016-11-15 NOTE — Progress Notes (Signed)
Subjective:    Patient ID: [name], female    DOB: 07-04-1956, 61 y.o.   MRN: [id number]  HPI She denies any breathing problems as a child. She reports she developed "mild asthma" in her 48s with similar symptoms to what she has now. She was treated with an unknown inhaler that seemed to help her symptoms. She reports her symptoms started after she started cleaning out her mother-in-law's home. She reports the home was shut up after she passed for some time. She reports no mold or musty smell but is dusty. She reports no significant improvement in her cough or breathing with using her current ProAir inhaler. Patient reports she had a chest x-ray last week that reportedly didn't show "pneumonia". She has been treated with 1.5 courses of Cefdinir and was subsequently switched to Levaquin. She also was treated with Prednisone for 2 courses. She reports she has had continued sinus congestion & drainage as well bilateral fullness and pain in her ears. She reports her sinus drainage is post-nasal. She reports she was evaluated by ENT and had no abnormality. She reports she has minimal mucus production and the mucus is "thick". She reports her sinus drainage is "green": She reports that the Prednisone did seem to help her symptoms. She reports she gets "seasonal bronchitis" 2-3 times a year, specifically in the Fall and Spring. She denies any wheezing. She reports no significant fever. She has sweats as well as prior hot & cold chills. No chest pain and only mild chest tightness previously. Her tightness seems to be improving. No rashes or bruising. No abdominal pain. She does have diarrhea and nausea. She reports chronic problems with diarrhea. She has been on Macrodantin for the last 2-3 years due to history of recurrent UTI.   Review of Systems Does have occasional dysuria but no hematuria. She reports some chronic back pain/discomfort. No joint swelling or erythema. A pertinent 14 point review of  systems is negative except as per the history of presenting illness.  Allergies  Allergen Reactions  . Other Nausea And Vomiting and Swelling    Shrimp if eat a lot  . Cleocin [Clindamycin Hcl] Other (See Comments)    "irritated my esophagus"  . Iodine     Other reaction(s): Other (See Comments)  . Morphine And Related Nausea And Vomiting  . Pneumococcal Vaccines Other (See Comments)    Really high fever, and flu symptoms. MD instructed not to give  . Buprenorphine Hcl Nausea And Vomiting    Current Outpatient Prescriptions on File Prior to Visit  Medication Sig Dispense Refill  . albuterol (PROVENTIL HFA;VENTOLIN HFA) 108 (90 BASE) MCG/ACT inhaler Inhale 2 puffs into the lungs as needed for wheezing or shortness of breath.     Marland Kitchen atorvastatin (LIPITOR) 10 MG tablet TAKE 1 TABLET ONCE A DAY (AT BEDTIME)  1  . carvedilol (COREG) 6.25 MG tablet Take 6.25 mg by mouth 2 (two) times daily with a meal.     . flecainide (TAMBOCOR) 50 MG tablet Take 50 mg by mouth 2 (two) times daily.     . fluticasone (FLONASE) 50 MCG/ACT nasal spray Place 2 sprays into the nose daily as needed for rhinitis or allergies.    Marland Kitchen glipiZIDE (GLUCOTROL XL) 5 MG 24 hr tablet Take 5-10 mg by mouth 2 (two) times daily. 2 tablets every morning and 1 tablet every evening    . lisinopril-hydrochlorothiazide (PRINZIDE,ZESTORETIC) 10-12.5 MG per tablet Take 1 tablet by mouth daily.     Marland Kitchen  loratadine (CLARITIN) 10 MG tablet Take 10 mg by mouth daily as needed for allergies.    . metFORMIN (GLUCOPHAGE) 1000 MG tablet Take 1,000 mg by mouth 2 (two) times daily with a meal.    . omeprazole (PRILOSEC) 20 MG capsule Take 20 mg by mouth.     . sitaGLIPtin (JANUVIA) 100 MG tablet Take 100 mg by mouth daily.    . nitrofurantoin (MACRODANTIN) 50 MG capsule Take 50 mg by mouth at bedtime.      No current facility-administered medications on file prior to visit.     Past Medical History:  Diagnosis Date  . Arthritis    "back,  knees, arms, wrists" (05/15/2013)  . Asthma   . Breast cancer ([place])   . CHF (congestive heart failure) ([place])    "mild" (05/15/2013)  . Chronic bronchitis ([place])   . Chronic lower back pain   . Dysrhythmia    atrial fib/dr [place] [facility]  . Family history of anesthesia complication    "my mother also had PONV" (05/15/2013)  . GERD (gastroesophageal reflux disease)   . Heart murmur   . High cholesterol   . Kidney stones   . Migraine    "haven't had one in the early 2000's" (05/15/2013)  . Pneumonia    "used to be chronic; last time I had it was 2013" (05/15/2013)  . PONV (postoperative nausea and vomiting)   . Recurrent UTI (urinary tract infection)    "from the continuous kidney stones; take Macrodantin qd" (05/15/2013)  . Sepsis ([place]) 05   kidney stone infection  . Tension headache   . Type II diabetes mellitus ([place])     Past Surgical History:  Procedure Laterality Date  . 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 BREAST BIOPSY Left 02/2001  . BREAST LUMPECTOMY Left 02/2001  . BREAST LUMPECTOMY Left 09/23/2013   Procedure: LEFT LUMPECTOMY WITH SPECIMEN MAMMOGRAM;  Surgeon: Adin Hector, MD;  Location: [facility];  Service: General;  Laterality: Left;  . COLONOSCOPY    . DIAGNOSTIC LAPAROSCOPY     cyst-near ovary  . LITHOTRIPSY     "2-3 times prior to 2002" (05/15/2013)  . SPINAL FUSION N/A 05/08/2013   Procedure: T9-S1 INSTRUMENTED FUSION T12 -S1 DECOMPRESSION;  Surgeon: Melina Schools, MD;  Location: [facility];  Service: Orthopedics;  Laterality: N/A;  . TUBAL LIGATION Bilateral 1985    Family History  Problem Relation Age of Onset  . COPD Mother   . Heart disease Mother   . Breast cancer Mother   . Kidney cancer Father   . Liver cancer Brother   . Environmental Allergies Other   . Lupus Other        niece  . Environmental Allergies Son     Social  History   Social History  . Marital status: Married    Spouse name: N/A  . Number of children: N/A  . Years of education: N/A   Social History Main Topics  . Smoking status: Passive Smoke Exposure - Never Smoker    Types: Cigarettes  . Smokeless tobacco: Never Used     Comment: Mother & Father smoked  . Alcohol use No  . Drug use: No  . Sexual activity: Yes   Other Topics Concern  . None   Social History Narrative   [facility] (11/15/16):   Patient's originally from [place]. Has always  lived in [place]. Currently has an outside dog. Remote exposure to [place] when her children were young. No known mold exposure. 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       Objective:   Physical Exam BP 98/66 (BP Location: Right Arm, Patient Position: Sitting, Cuff Size: Normal)   Pulse (!) 101   Ht 5\' 2"  (1.575 m)   Wt 150 lb 3.2 oz (68.1 kg)   SpO2 98%   BMI 27.47 kg/m  General:  Awake. Alert. No acute distress. Central obesity. Integument:  Warm & dry. No rash on exposed skin. No bruising on exposed skin. Extremities:  No cyanosis or clubbing.  Lymphatics:  No appreciated cervical or supraclavicular lymphadenoapthy. HEENT:  Moist mucus membranes. No oral ulcers. No scleral injection or icterus. Mild bilateral nasal turbinate swelling. Cardiovascular:  Regular rate. No edema. No appreciable JVD.  Pulmonary:  Good aeration & clear to auscultation bilaterally. Symmetric chest wall expansion. No accessory muscle use on room air. Abdomen: Soft. Normal bowel sounds. Nondistended. Grossly nontender. Musculoskeletal:  Normal bulk and tone. Hand grip strength 5/5 bilaterally. No joint deformity or effusion appreciated. Neurological:  CN 2-12 grossly in tact. No meningismus. Moving all 4 extremities equally. Symmetric brachioradialis deep tendon reflexes. Psychiatric:  Mood and affect congruent. Speech normal rhythm, rate & tone.       Assessment & Plan:  61 y.o. female with history of chronic bronchitis as well as remote diagnosis of underlying asthma. I do question whether or not she may have an immune deficiency contributing to her recurrent respiratory illnesses. Alternatively, seasonal variations in pollen count and environmental exposures could be the driving factor. Certainly her reflux seems to be reasonably controlled at this time. Further complicating the patient's clinical picture is her chronic use of Macrobid which can result in parenchymal lung injury. As the patient is improving somewhat. I feel it's reasonable to continue workup and hold off on initiating any other new medications at this time. I do question whether or not there could be some environmental exposure at her mother-in-law's home that we are not aware of. I instructed the patient contact me if her symptoms do not continue to improve or if symptoms began to recur.  1. Simple chronic bronchitis: Checking full pulmonary function testing on her before next appointment. Checking quantitative immunoglobulin panel. Also checking CT chest without contrast. Patient instructed to stop Levaquin. 2. Asthma: Checking full pulmonary function testing on her before next appointment. Checking CBC with differential & RAST panel. 3. Chronic non-seasonal allergic rhinitis: Starting Singulair 10 mg by mouth daily at bedtime. Patient to bring her maxillofacial CT scan on disc format for my review. 4. GERD: Currently controlled with Prilosec. No changes. Following with GI with planned colonoscopy. 5. Follow-up: Patient to return to clinic in 6 weeks or sooner if needed.  Sonia Baller Ashok Cordia, M.D. [facility] Pulmonary & Critical Care Pager:  [phone] After 3pm or if no response, call [phone] 12:20 PM 11/15/16

## 2016-11-15 NOTE — Patient Instructions (Addendum)
   Stop using your Levaquin.  Please have your X-ray and sinus CT scan put on a disc for my review.  We will review your test results at your next appointment.  Call me if you feel your breathing or symptoms are getting worse.  TESTS ORDERED: 1. Serum Quantitative Immunoglobulin Panel, RAST Panel, & CBC with differential. 2. CT Chest W/O 3. Full PFTs on or before next appointment

## 2016-11-16 LAB — RESPIRATORY ALLERGY PROFILE REGION II ~~LOC~~
ALLERGEN, COTTONWOOD, T14: 0.11 kU/L — AB
ASPERGILLUS FUMIGATUS M3: 1.84 kU/L — AB
Allergen, Comm Silver Birch, t9: <0.1 kU/L
Allergen, Oak,t7: <0.1 kU/L
Box Elder IgE: <0.1 kU/L
Cat Dander: <0.1 kU/L
Cockroach: <0.1 kU/L
Dog Dander: <0.1 kU/L
Elm IgE: 0.26 kU/L — ABNORMAL HIGH
IgE (Immunoglobulin E), Serum: 618 kU/L — ABNORMAL HIGH (ref <115)
Rough Pigweed  IgE: <0.1 kU/L
Sheep Sorrel IgE: <0.1 kU/L

## 2016-11-16 LAB — IGG, IGA, IGM
IgA: 150 mg/dL (ref 81–463)
IgG (Immunoglobin G), Serum: 789 mg/dL (ref 694–1618)
IgM, Serum: 86 mg/dL (ref 48–271)

## 2016-11-23 ENCOUNTER — Telehealth: Payer: Self-pay | Admitting: Pulmonary Disease

## 2016-11-23 ENCOUNTER — Ambulatory Visit (INDEPENDENT_AMBULATORY_CARE_PROVIDER_SITE_OTHER)
Admission: RE | Admit: 2016-11-23 | Discharge: 2016-11-23 | Disposition: A | Discharge status: Home / Self Care | Payer: Federal, State, Local not specified - PPO | Source: Ambulatory Visit | Attending: Pulmonary Disease | Admitting: Pulmonary Disease

## 2016-11-23 DIAGNOSIS — J410 Simple chronic bronchitis: Secondary | ICD-10-CM | POA: Diagnosis not present

## 2016-11-23 NOTE — Telephone Encounter (Signed)
CT has been placed in [name]'s cubby for review.  [name] please advise. Thanks.

## 2016-11-25 HISTORY — PX: ESOPHAGOGASTRODUODENOSCOPY: SHX1529

## 2016-11-29 NOTE — Telephone Encounter (Signed)
Please make sure to remind me on Friday when I'm in clinic to review the disc. Thanks.

## 2016-12-01 ENCOUNTER — Other Ambulatory Visit: Payer: Self-pay | Admitting: Hematology & Oncology

## 2016-12-01 DIAGNOSIS — Z1231 Encounter for screening mammogram for malignant neoplasm of breast: Secondary | ICD-10-CM

## 2016-12-01 NOTE — Telephone Encounter (Signed)
Message was forwarded to [facility] yesterday to remind JN. Will hold message for further documentation

## 2016-12-02 NOTE — Telephone Encounter (Signed)
Dr. Ashok Cordia currently has CD.

## 2016-12-05 NOTE — Progress Notes (Signed)
Left message for patient to contact office for medical results.

## 2016-12-05 NOTE — Telephone Encounter (Signed)
Will route to [facility] for follow up.

## 2016-12-05 NOTE — Telephone Encounter (Signed)
Patient returned phone call, advised pt JN will review CT/CD upon returning to clinic later this week..the patient states will waiting for a call back for results...ert

## 2016-12-05 NOTE — Telephone Encounter (Signed)
JN please advise if anything further is needed regarding this CT.  Thanks!

## 2016-12-05 NOTE — Telephone Encounter (Signed)
I will review the CT later this week when I'm back in office.

## 2016-12-07 NOTE — Progress Notes (Signed)
Spoke with patient and informed her of results and recommendations. Patient did not have any questions and verbalized understanding. Nothing further is needed.

## 2016-12-08 NOTE — Telephone Encounter (Signed)
Please let her know that I reviewed her CT scan and her sinuses are essentially clear. There is a bit of calcium deposited in one of them but nothing else that is abnormal.

## 2016-12-08 NOTE — Telephone Encounter (Signed)
JN please advise of results of the CT. Please advise. Thanks

## 2016-12-09 NOTE — Telephone Encounter (Signed)
Spoke with patient and informed her of results. Pt verbalized understanding and did not have any questions. Nothing further is needed.

## 2016-12-09 NOTE — Telephone Encounter (Signed)
LM for patient x 1 

## 2016-12-09 NOTE — Telephone Encounter (Signed)
Pt returned phone call, received results already...ert

## 2016-12-15 ENCOUNTER — Ambulatory Visit
Admission: RE | Admit: 2016-12-15 | Discharge: 2016-12-15 | Disposition: A | Discharge status: Home / Self Care | Payer: Federal, State, Local not specified - PPO | Source: Ambulatory Visit | Attending: Hematology & Oncology | Admitting: Hematology & Oncology

## 2016-12-15 DIAGNOSIS — Z1231 Encounter for screening mammogram for malignant neoplasm of breast: Secondary | ICD-10-CM

## 2017-01-06 ENCOUNTER — Ambulatory Visit
Admission: RE | Admit: 2017-01-06 | Discharge: 2017-01-06 | Disposition: A | Discharge status: Home / Self Care | Payer: Self-pay | Source: Ambulatory Visit | Attending: Pulmonary Disease | Admitting: Pulmonary Disease

## 2017-01-06 ENCOUNTER — Other Ambulatory Visit: Payer: Self-pay | Admitting: Pulmonary Disease

## 2017-01-06 DIAGNOSIS — J3089 Other allergic rhinitis: Secondary | ICD-10-CM

## 2017-01-18 ENCOUNTER — Ambulatory Visit (INDEPENDENT_AMBULATORY_CARE_PROVIDER_SITE_OTHER): Payer: Federal, State, Local not specified - PPO | Admitting: Pulmonary Disease

## 2017-01-18 ENCOUNTER — Encounter: Payer: Self-pay | Admitting: Pulmonary Disease

## 2017-01-18 VITALS — BP 110/80 | HR 72 | Ht 59.5 in | Wt 150.0 lb

## 2017-01-18 DIAGNOSIS — K219 Gastro-esophageal reflux disease without esophagitis: Secondary | ICD-10-CM | POA: Diagnosis not present

## 2017-01-18 DIAGNOSIS — J984 Other disorders of lung: Secondary | ICD-10-CM

## 2017-01-18 DIAGNOSIS — J4530 Mild persistent asthma, uncomplicated: Secondary | ICD-10-CM | POA: Diagnosis not present

## 2017-01-18 DIAGNOSIS — J410 Simple chronic bronchitis: Secondary | ICD-10-CM

## 2017-01-18 DIAGNOSIS — J3089 Other allergic rhinitis: Secondary | ICD-10-CM

## 2017-01-18 HISTORY — DX: Other disorders of lung: J98.4

## 2017-01-18 LAB — PULMONARY FUNCTION TEST
DL/VA % pred: 101 %
DL/VA: 4.24 ml/min/mmHg/L
DLCO UNC: 12.01 ml/min/mmHg
DLCO cor % pred: 74 %
DLCO cor: 13.5 ml/min/mmHg
DLCO unc % pred: 65 %
FEF 25-75 Post: 1.13 L/sec
FEF 25-75 Pre: 0.8 L/sec
FEF2575-%Change-Post: 40 %
FEF2575-%Pred-Post: 55 %
FEF2575-%Pred-Pre: 39 %
FEV1-%CHANGE-POST: 3 %
FEV1-%PRED-POST: 63 %
FEV1-%Pred-Pre: 61 %
FEV1-POST: 1.34 L
FEV1-PRE: 1.29 L
FEV1FVC-%Change-Post: 7 %
FEV1FVC-%Pred-Pre: 95 %
FEV6-%Change-Post: -1 %
FEV6-%PRED-POST: 63 %
FEV6-%Pred-Pre: 64 %
FEV6-POST: 1.67 L
FEV6-PRE: 1.7 L
FEV6FVC-%CHANGE-POST: 1 %
FEV6FVC-%PRED-POST: 104 %
FEV6FVC-%Pred-Pre: 102 %
FVC-%CHANGE-POST: -3 %
FVC-%Pred-Post: 61 %
FVC-%Pred-Pre: 62 %
FVC-Post: 1.67 L
FVC-Pre: 1.72 L
PRE FEV6/FVC RATIO: 99 %
Post FEV1/FVC ratio: 80 %
Post FEV6/FVC ratio: 100 %
Pre FEV1/FVC ratio: 75 %
RV % PRED: 90 %
RV: 1.63 L
TLC % pred: 75 %
TLC: 3.31 L

## 2017-01-18 MED ORDER — MONTELUKAST SODIUM 10 MG PO TABS: 10.0000 mg | ORAL_TABLET | Freq: Every day | ORAL | 11 refills | Status: DC

## 2017-01-18 MED ORDER — FLUTICASONE PROPIONATE 50 MCG/ACT NA SUSP: 1.0000 | Freq: Two times a day (BID) | NASAL | 6 refills | Status: DC

## 2017-01-18 NOTE — Addendum Note (Signed)
Addended by: Tyson Dense on: 01/18/2017 11:06 AM   Modules accepted: Orders

## 2017-01-18 NOTE — Patient Instructions (Signed)
   Remember to use the Flonase every day.  We are keeping all your medications the same.  Call me if you have any new breathing problems or questions before your next appointment.

## 2017-01-18 NOTE — Progress Notes (Signed)
PFT done today. 

## 2017-01-18 NOTE — Progress Notes (Signed)
Subjective:    Patient ID: [name], female    DOB: 10-03-1955, 61 y.o.   MRN: [id number]  C.C.:  Follow-up for Simple Chronic Bronchitis, Asthma, Chronic Non-seasonal Allergic Rhinitis, & GERD.   HPI Simple Chronic Bronchitis:  Stopped Levaquin at last appointment. No episodes since last appointment.   Asthma: Remote diagnosis with known history of breathing problems as a child. Developed symptoms beginning in her 10s. Started on singular at last appointment. She denies any wheezing. Improved dyspnea. Hasn't needed her rescue inhaler since her last visit.   Chronic Non-seasonal Allergic Rhinitis: Started on Singulair at last appointment. He is sleeping had a sinus CT scan which I reviewed and showed no evidence of mucosal thickening. She still has some sinus congestion & post nasal drainage that has improved somewhat. She is using her Flonase intermittently. She is using Claritin daily.   GERD: Previously controlled with Prilosec. Follows with GI. No reflux, dyspepsia, or morning brash water taste.   Review of Systems No chest tightness, pressure, or pain. No fever or chills. No rashes or bruising.  Allergies  Allergen Reactions  . Other Nausea And Vomiting and Swelling    Shrimp if eat a lot  . Cleocin [Clindamycin Hcl] Other (See Comments)    "irritated my esophagus"  . Iodine     Other reaction(s): Other (See Comments)  . Morphine And Related Nausea And Vomiting  . Pneumococcal Vaccines Other (See Comments)    Really high fever, and flu symptoms. MD instructed not to give  . Buprenorphine Hcl Nausea And Vomiting    Current Outpatient Prescriptions on File Prior to Visit  Medication Sig Dispense Refill  . albuterol (PROVENTIL HFA;VENTOLIN HFA) 108 (90 BASE) MCG/ACT inhaler Inhale 2 puffs into the lungs as needed for wheezing or shortness of breath.     Marland Kitchen atorvastatin (LIPITOR) 10 MG tablet TAKE 1 TABLET ONCE A DAY (AT BEDTIME)  1  . carvedilol (COREG) 6.25 MG tablet  Take 6.25 mg by mouth 2 (two) times daily with a meal.     . ELIQUIS 5 MG TABS tablet Take 5 mg by mouth 2 (two) times daily.  12  . flecainide (TAMBOCOR) 50 MG tablet Take 50 mg by mouth 2 (two) times daily.     . fluticasone (FLONASE) 50 MCG/ACT nasal spray Place 2 sprays into the nose daily as needed for rhinitis or allergies.    Marland Kitchen glipiZIDE (GLUCOTROL XL) 5 MG 24 hr tablet Take 5-10 mg by mouth 2 (two) times daily. 2 tablets every morning and 1 tablet every evening    . lisinopril-hydrochlorothiazide (PRINZIDE,ZESTORETIC) 10-12.5 MG per tablet Take 1 tablet by mouth daily.     Marland Kitchen loratadine (CLARITIN) 10 MG tablet Take 10 mg by mouth daily as needed for allergies.    . metFORMIN (GLUCOPHAGE) 1000 MG tablet Take 1,000 mg by mouth 2 (two) times daily with a meal.    . montelukast (SINGULAIR) 10 MG tablet Take 1 tablet (10 mg total) by mouth at bedtime. 30 tablet 3  . omeprazole (PRILOSEC) 20 MG capsule Take 20 mg by mouth.     . sertraline (ZOLOFT) 50 MG tablet Take 50 mg by mouth daily.  5  . sitaGLIPtin (JANUVIA) 100 MG tablet Take 100 mg by mouth daily.    . nitrofurantoin (MACRODANTIN) 50 MG capsule Take 50 mg by mouth at bedtime.      No current facility-administered medications on file prior to visit.     Past Medical  History:  Diagnosis Date  . Arthritis    "back, knees, arms, wrists" (05/15/2013)  . Asthma   . Breast cancer ([place])   . CHF (congestive heart failure) ([place])    "mild" (05/15/2013)  . Chronic bronchitis ([place])   . Chronic lower back pain   . Dysrhythmia    atrial fib/dr [place] [facility]  . Family history of anesthesia complication    "my mother also had PONV" (05/15/2013)  . GERD (gastroesophageal reflux disease)   . Heart murmur   . High cholesterol   . Kidney stones   . Migraine    "haven't had one in the early 2000's" (05/15/2013)  . Pneumonia    "used to be chronic; last time I had it was 2013" (05/15/2013)  . PONV (postoperative nausea and  vomiting)   . Recurrent UTI (urinary tract infection)    "from the continuous kidney stones; take Macrodantin qd" (05/15/2013)  . Sepsis ([place]) 05   kidney stone infection  . Tension headache   . Type II diabetes mellitus ([place])     Past Surgical History:  Procedure Laterality Date  . 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 BREAST BIOPSY Left 02/2001  . BREAST LUMPECTOMY Left 02/2001  . BREAST LUMPECTOMY Left 09/23/2013   Procedure: LEFT LUMPECTOMY WITH SPECIMEN MAMMOGRAM;  Surgeon: Adin Hector, MD;  Location: [facility];  Service: General;  Laterality: Left;  . COLONOSCOPY    . DIAGNOSTIC LAPAROSCOPY     cyst-near ovary  . LITHOTRIPSY     "2-3 times prior to 2002" (05/15/2013)  . SPINAL FUSION N/A 05/08/2013   Procedure: T9-S1 INSTRUMENTED FUSION T12 -S1 DECOMPRESSION;  Surgeon: Melina Schools, MD;  Location: [facility];  Service: Orthopedics;  Laterality: N/A;  . TUBAL LIGATION Bilateral 1985    Family History  Problem Relation Age of Onset  . COPD Mother   . Heart disease Mother   . Breast cancer Mother   . Kidney cancer Father   . Liver cancer Brother   . Environmental Allergies Other   . Lupus Other        niece  . Environmental Allergies Son     Social History   Social History  . Marital status: Married    Spouse name: N/A  . Number of children: N/A  . Years of education: N/A   Social History Main Topics  . Smoking status: Passive Smoke Exposure - Never Smoker    Types: Cigarettes  . Smokeless tobacco: Never Used     Comment: Mother & Father smoked  . Alcohol use No  . Drug use: No  . Sexual activity: Yes   Other Topics Concern  . None   Social History Narrative   [facility] (11/15/16):   Patient's originally from [place]. Has always lived in [place]. Currently has an outside dog. Remote exposure to [place] when her children were  young. No known mold exposure. 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       Objective:   Physical Exam BP 110/80 (BP Location: Right Arm, Patient Position: Sitting, Cuff Size: Normal)   Pulse 72   Ht 4' 11.5" (1.511 m)   Wt 150 lb (68 kg)   SpO2 98%   BMI 29.79 kg/m   General:  Awake. Alert. No acute distress. Mild central obesity. Integument:  Warm & dry. No  rash on exposed skin.  Extremities:  No cyanosis or clubbing.  HEENT:  Moist mucus membranes. Moderate bilateral nasal turbinate swelling. No oral ulcers. Cardiovascular:  Regular rate. No edema.  Normal S1 & S2  Pulmonary:  Clear to auscultation bilaterally. Normal work of breathing on room air. Speaking in complete sentences Abdomen: Soft. Normal bowel sounds.  Mildly protuberant Musculoskeletal:  Normal bulk and tone. No joint deformity or effusion appreciated.  PFT 01/18/17: FVC 1.72 L (62%) FEV1 1.29 L (61%) FEV1/FVC 0.75 FEF 25-75 0.80 L (39%) negative bronchodilator response TLC 3.31 L (75%) RV 90% ERV 32% DLCO corrected 74%  IMAGING CT CHEST W/O 11/23/16 (personally reviewed by me):  Minimal dependent atelectasis in the bases. No parenchymal nodule or opacity appreciated. No pleural effusion or thickening. No pericardial effusion. No pathologic mediastinal adenopathy.  LABS 11/15/16: IgG: 789 IgA: 150 IgM: 86 IgE: 618 RAST panel: [place] 0.26/[place] 0.11/Aspergillus fumigatus 1.84 CBC: 11.8/11.4/35.0/266 Eosinophils: 0.3    Assessment & Plan:  61 y.o. female with a history of chronic bronchitis and remote diagnosis of asthma. At last appointment she had symptoms consistent with nonseasonal chronic allergic rhinitis as well as GERD. Lung volumes performed today shows no significant bronchodilator response but does show mild restrictive lung diseaseLikely due to her mild central obesity. I reviewed her chest CT imaging which shows no suggestion of fibrotic changes. Her reflux  seems to be controlled at this time. Her chronic allergic rhinitis is suboptimally controlled and would benefit from regular, daily use of intranasal corticosteroids. I instructed the patient to notify me if she had any new breathing problems or questions before next appointment.  1. Simple chronic bronchitis:Likely due to previous exacerbations of asthma. 2. Mild, persistent asthma: Continuing patient on Singulair 10 mg by mouth daily at bedtime. Continuing albuterol inhaler as needed. 3. Chronic nonseasonal allergic rhinitis: Patient intolerant of intranasal saline rinses. Recommended using Flonase 1 spray in each nostril twice daily. Continuing Singulair and Claritin. 4. GERD: Controlled with Prilosec. No new medications at this time. 5. Mild restrictive lung disease: Likely secondary to obesity without evidence of fibrotic changes on CT imaging. No new testing. 6. Health maintenance: Status post influenza vaccine 2017. She is allergic to the Pneumovax.  7. Follow-up: Return to clinic in 6 months or sooner if needed.  Sonia Baller Ashok Cordia, M.D. [facility] Pulmonary & Critical Care Pager:  [phone] After 3pm or if no response, call [phone] 10:35 AM 01/18/17

## 2017-04-17 ENCOUNTER — Ambulatory Visit (HOSPITAL_BASED_OUTPATIENT_CLINIC_OR_DEPARTMENT_OTHER): Payer: Federal, State, Local not specified - PPO | Admitting: Hematology & Oncology

## 2017-04-17 ENCOUNTER — Other Ambulatory Visit (HOSPITAL_BASED_OUTPATIENT_CLINIC_OR_DEPARTMENT_OTHER): Payer: Federal, State, Local not specified - PPO

## 2017-04-17 VITALS — BP 118/58 | HR 86 | Temp 98.8°F | Resp 18 | Wt 147.0 lb

## 2017-04-17 DIAGNOSIS — E119 Type 2 diabetes mellitus without complications: Secondary | ICD-10-CM | POA: Diagnosis not present

## 2017-04-17 DIAGNOSIS — Z170 Estrogen receptor positive status [ER+]: Principal | ICD-10-CM

## 2017-04-17 DIAGNOSIS — Z853 Personal history of malignant neoplasm of breast: Secondary | ICD-10-CM | POA: Diagnosis not present

## 2017-04-17 DIAGNOSIS — D631 Anemia in chronic kidney disease: Secondary | ICD-10-CM

## 2017-04-17 DIAGNOSIS — C50912 Malignant neoplasm of unspecified site of left female breast: Secondary | ICD-10-CM

## 2017-04-17 DIAGNOSIS — N183 Chronic kidney disease, stage 3 unspecified: Secondary | ICD-10-CM

## 2017-04-17 DIAGNOSIS — D649 Anemia, unspecified: Secondary | ICD-10-CM

## 2017-04-17 DIAGNOSIS — D500 Iron deficiency anemia secondary to blood loss (chronic): Secondary | ICD-10-CM

## 2017-04-17 LAB — CMP (CANCER CENTER ONLY)
ALT(SGPT): 20 U/L (ref 10–47)
AST: 24 U/L (ref 11–38)
Albumin: 3.7 g/dL (ref 3.3–5.5)
Alkaline Phosphatase: 62 U/L (ref 26–84)
BUN, Bld: 28 mg/dL — ABNORMAL HIGH (ref 7–22)
CALCIUM: 9.6 mg/dL (ref 8.0–10.3)
CHLORIDE: 109 meq/L — AB (ref 98–108)
CO2: 23 mEq/L (ref 18–33)
Creat: 1.3 mg/dl — ABNORMAL HIGH (ref 0.6–1.2)
Glucose, Bld: 212 mg/dL — ABNORMAL HIGH (ref 73–118)
POTASSIUM: 4.6 meq/L (ref 3.3–4.7)
Sodium: 144 mEq/L (ref 128–145)
TOTAL PROTEIN: 7.4 g/dL (ref 6.4–8.1)
Total Bilirubin: 0.4 mg/dl (ref 0.20–1.60)

## 2017-04-17 LAB — CBC WITH DIFFERENTIAL (CANCER CENTER ONLY)
BASO#: 0 10*3/uL (ref 0.0–0.2)
BASO%: 0.6 % (ref 0.0–2.0)
EOS ABS: 0.2 10*3/uL (ref 0.0–0.5)
EOS%: 2.4 % (ref 0.0–7.0)
HEMATOCRIT: 32.2 % — AB (ref 34.8–46.6)
HGB: 10.2 g/dL — ABNORMAL LOW (ref 11.6–15.9)
LYMPH#: 1.7 10*3/uL (ref 0.9–3.3)
LYMPH%: 23.4 % (ref 14.0–48.0)
MCH: 29.3 pg (ref 26.0–34.0)
MCHC: 31.7 g/dL — ABNORMAL LOW (ref 32.0–36.0)
MCV: 93 fL (ref 81–101)
MONO#: 0.4 10*3/uL (ref 0.1–0.9)
MONO%: 5.2 % (ref 0.0–13.0)
NEUT%: 68.4 % (ref 39.6–80.0)
NEUTROS ABS: 4.9 10*3/uL (ref 1.5–6.5)
PLATELETS: 273 10*3/uL (ref 145–400)
RBC: 3.48 10*6/uL — ABNORMAL LOW (ref 3.70–5.32)
RDW: 14.7 % (ref 11.1–15.7)
WBC: 7.2 10*3/uL (ref 3.9–10.0)

## 2017-04-17 LAB — LACTATE DEHYDROGENASE: LDH: 144 U/L (ref 125–245)

## 2017-04-17 NOTE — Progress Notes (Signed)
Hematology and Oncology Follow Up Visit  EMMALIN JAQUESS [id number] Mar 20, 1956 61 y.o. 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 Icenhour is here today for her annual follow-up. She's feeling a little bit tired. Overall, she had a pretty good year since we last saw her. She comes in yearly to see [place].  She's had no problems with cough or shortness of breath. She's had no weight loss or weight gain. She has had no nausea or vomiting. She's had no change in bowel or bladder habits.  She had her mammogram in June. Everything looked okay.  She does have diabetes. She is on multiple medications for this. She does have cardiac issues. She is on multiple medications for this.  I noted that she is a little anemic. She denies any type of bleeding per rectum. We will have to run iron studies on her.  She's had no rashes. There's been no leg swelling.  Overall, her performance status is ECOG 1.  Medications:  Allergies as of 04/17/2017      Reactions   Other Nausea And Vomiting, Swelling   Shrimp if eat a lot   Cleocin [clindamycin Hcl] Other (See Comments)   "irritated my esophagus"   Iodine    Other reaction(s): Other (See Comments)   Morphine And Related Nausea And Vomiting   Pneumococcal Vaccines Other (See Comments)   Really high fever, and flu symptoms. MD instructed not to give   Buprenorphine Hcl Nausea And Vomiting      Medication List       Accurate as of 04/17/17 12:42 PM. Always use your most recent med list.          albuterol 108 (90 Base) MCG/ACT inhaler Commonly known as:  PROVENTIL HFA;VENTOLIN HFA Inhale 2 puffs into the lungs as needed for wheezing or shortness of breath.   atorvastatin 10 MG tablet Commonly known as:  LIPITOR TAKE 1 TABLET ONCE A DAY (AT BEDTIME)   carvedilol 6.25 MG tablet Commonly known as:  COREG Take 6.25 mg by  mouth 2 (two) times daily with a meal.   ELIQUIS 5 MG Tabs tablet Generic drug:  apixaban Take 5 mg by mouth 2 (two) times daily.   flecainide 50 MG tablet Commonly known as:  TAMBOCOR Take 50 mg by mouth 2 (two) times daily.   fluticasone 50 MCG/ACT nasal spray Commonly known as:  FLONASE Place 1 spray into both nostrils 2 (two) times daily.   glipiZIDE 5 MG 24 hr tablet Commonly known as:  GLUCOTROL XL Take 5-10 mg by mouth 2 (two) times daily. 2 tablets every morning and 1 tablet every evening   lisinopril-hydrochlorothiazide 10-12.5 MG tablet Commonly known as:  PRINZIDE,ZESTORETIC Take 1 tablet by mouth daily.   loratadine 10 MG tablet Commonly known as:  CLARITIN Take 10 mg by mouth daily as needed for allergies.   metFORMIN 1000 MG tablet Commonly known as:  GLUCOPHAGE Take 1,000 mg by mouth 2 (two) times daily with a meal.   montelukast 10 MG tablet Commonly known as:  SINGULAIR Take 1 tablet (10 mg total) by mouth at bedtime.   nitrofurantoin 50 MG capsule Commonly known as:  MACRODANTIN Take 50 mg by mouth at bedtime.   omeprazole 20 MG capsule Commonly known as:  PRILOSEC Take 20 mg by mouth.   sertraline 50 MG tablet Commonly known as:  ZOLOFT Take 50 mg by mouth daily.   sitaGLIPtin 100 MG tablet Commonly known as:  JANUVIA Take 100 mg by mouth daily.       Allergies:  Allergies  Allergen Reactions  . Other Nausea And Vomiting and Swelling    Shrimp if eat a lot  . Cleocin [Clindamycin Hcl] Other (See Comments)    "irritated my esophagus"  . Iodine     Other reaction(s): Other (See Comments)  . Morphine And Related Nausea And Vomiting  . Pneumococcal Vaccines Other (See Comments)    Really high fever, and flu symptoms. MD instructed not to give  . Buprenorphine Hcl Nausea And Vomiting    Past Medical History, Surgical history, Social history, and Family History were reviewed and updated.  Review of Systems: As stated in the interim  history  Physical Exam:  weight is 147 lb (66.7 kg). Her oral temperature is 98.8 F (37.1 C). Her blood pressure is 118/58 (abnormal) and her pulse is 86. Her respiration is 18 and oxygen saturation is 100%.   Wt Readings from Last 3 Encounters:  04/17/17 147 lb (66.7 kg)  01/18/17 150 lb (68 kg)  11/15/16 150 lb 3.2 oz (68.1 kg)    Well-developed and well-nourished white female in no obvious distress. Head and neck exam shows no ocular or oral lesions. There are no palpable cervical or supraclavicular lymph nodes. Lungs are clear bilaterally. Cardiac exam regular rate and rhythm with no murmurs, rubs or bruits. Breast exam shows right breast no masses, edema or erythema. There is no right axillary adenopathy. Left breast is somewhat contracted from surgery and radiation. She has a well-healed lumpectomy scar at about the 9:00 position. There is some firmness at the lumpectomy site. She has a well-healed left axillary lymphadenectomy scar. Abdomen is soft. She is good bowel sounds pretty there is no fluid wave. There is no palpable liver or spleen tip. Back exam shows no tenderness over the spine, ribs or hips. Extremities shows no clubbing, cyanosis or edema. There is no lymphedema of the left arm. Skin exam shows no rashes, ecchymoses or petechia. Neurological exam shows no focal neurological deficits.   Lab Results  Component Value Date   WBC 7.2 04/17/2017   HGB 10.2 (L) 04/17/2017   HCT 32.2 (L) 04/17/2017   MCV 93 04/17/2017   PLT 273 04/17/2017   No results found for: FERRITIN, IRON, TIBC, UIBC, IRONPCTSAT Lab Results  Component Value Date   RBC 3.48 (L) 04/17/2017   No results found for: Nils Pyle [facility] Lab Results  Component Value Date   IGGSERUM 789 11/15/2016   IGA 150 11/15/2016   IGMSERUM 86 11/15/2016   No results found for: Odetta Pink, SPEI   Chemistry      Component Value Date/Time    NA 144 04/17/2017 1150   NA 141 04/18/2016 1256   K 4.6 04/17/2017 1150   K 4.4 04/18/2016 1256   CL 109 (H) 04/17/2017 1150   CO2 23 04/17/2017 1150   CO2 18 (L) 04/18/2016 1256   BUN 28 (H) 04/17/2017 1150   BUN 27.3 (H) 04/18/2016 1256   CREATININE 1.3 (H) 04/17/2017 1150   CREATININE 1.4 (H) 04/18/2016 1256      Component Value Date/Time   CALCIUM 9.6 04/17/2017 1150   CALCIUM 9.4 04/18/2016 1256   ALKPHOS 62 04/17/2017 1150   ALKPHOS 68 04/18/2016 1256   AST 24 04/17/2017 1150   AST 14 04/18/2016  1256   ALT 20 04/17/2017 1150   ALT 23 04/18/2016 1256   BILITOT 0.40 04/17/2017 1150   BILITOT <0.22 04/18/2016 1256     Impression and Plan: Ms. Fern is a 61 year old white female. She has a history of a stage I ductal carcinoma of the left breast. She is now 15 years out. I really don't think this is going to be a problem for her.  My concern however is this anemia. This is getting more prominent. She's having some symptoms.  I looked at her blood under the microscope. Everything looked okay. I do not see any immature myeloid or lymphoid cells. I saw no nucleated red blood cells.  We will also check her erythropoietin level. With her diabetes, she may be erythropoietin deficient. This I think would be quite helpful to know.  If she is truly iron deficient, we will set her up with IV iron this week. Oral iron will not work because of all the medications that she is taking. cerns.   I would like to see her back in 5 weeks.  I spent about 25-30 minutes with her going over the issues with her anemia. Marland Kitchen   [name], MD 10/15/201812:42 PM

## 2017-04-18 LAB — IRON AND TIBC
%SAT: 14 % — ABNORMAL LOW (ref 21–57)
Iron: 51 ug/dL (ref 41–142)
TIBC: 363 ug/dL (ref 236–444)
UIBC: 312 ug/dL (ref 120–384)

## 2017-04-18 LAB — FERRITIN: Ferritin: 20 ng/ml (ref 9–269)

## 2017-04-18 LAB — ERYTHROPOIETIN: Erythropoietin: 22.9 m[IU]/mL — ABNORMAL HIGH (ref 2.6–18.5)

## 2017-04-18 LAB — RETICULOCYTES: RETICULOCYTE COUNT: 1.8 % (ref 0.6–2.6)

## 2017-04-19 ENCOUNTER — Other Ambulatory Visit: Payer: Self-pay | Admitting: Family

## 2017-04-19 DIAGNOSIS — D508 Other iron deficiency anemias: Secondary | ICD-10-CM

## 2017-04-19 DIAGNOSIS — D509 Iron deficiency anemia, unspecified: Secondary | ICD-10-CM

## 2017-04-19 HISTORY — DX: Iron deficiency anemia, unspecified: D50.9

## 2017-04-20 ENCOUNTER — Ambulatory Visit (HOSPITAL_BASED_OUTPATIENT_CLINIC_OR_DEPARTMENT_OTHER): Payer: Federal, State, Local not specified - PPO

## 2017-04-20 VITALS — BP 121/66 | HR 77 | Temp 98.6°F | Resp 18

## 2017-04-20 DIAGNOSIS — D649 Anemia, unspecified: Secondary | ICD-10-CM | POA: Diagnosis not present

## 2017-04-20 DIAGNOSIS — D508 Other iron deficiency anemias: Secondary | ICD-10-CM

## 2017-04-20 MED ORDER — SODIUM CHLORIDE 0.9 % IV SOLN
510.0000 mg | Freq: Once | INTRAVENOUS | Status: AC
  Administered 2017-04-20: 510 mg via INTRAVENOUS
  Filled 2017-04-20: qty 17

## 2017-04-20 MED ORDER — SODIUM CHLORIDE 0.9 % IV SOLN
Freq: Once | INTRAVENOUS | Status: AC
  Administered 2017-04-20: 10:00:00 via INTRAVENOUS

## 2017-04-20 NOTE — Patient Instructions (Signed)

## 2017-05-22 ENCOUNTER — Ambulatory Visit (HOSPITAL_BASED_OUTPATIENT_CLINIC_OR_DEPARTMENT_OTHER): Payer: Federal, State, Local not specified - PPO | Admitting: Hematology & Oncology

## 2017-05-22 ENCOUNTER — Other Ambulatory Visit: Payer: Self-pay

## 2017-05-22 ENCOUNTER — Other Ambulatory Visit (HOSPITAL_BASED_OUTPATIENT_CLINIC_OR_DEPARTMENT_OTHER): Payer: Federal, State, Local not specified - PPO

## 2017-05-22 VITALS — BP 136/78 | HR 77 | Temp 98.3°F | Resp 20 | Wt 146.2 lb

## 2017-05-22 DIAGNOSIS — D509 Iron deficiency anemia, unspecified: Secondary | ICD-10-CM | POA: Diagnosis not present

## 2017-05-22 DIAGNOSIS — N189 Chronic kidney disease, unspecified: Secondary | ICD-10-CM

## 2017-05-22 DIAGNOSIS — D631 Anemia in chronic kidney disease: Secondary | ICD-10-CM | POA: Diagnosis not present

## 2017-05-22 DIAGNOSIS — N183 Chronic kidney disease, stage 3 (moderate): Secondary | ICD-10-CM

## 2017-05-22 DIAGNOSIS — D500 Iron deficiency anemia secondary to blood loss (chronic): Secondary | ICD-10-CM

## 2017-05-22 DIAGNOSIS — Z853 Personal history of malignant neoplasm of breast: Secondary | ICD-10-CM

## 2017-05-22 LAB — CBC WITH DIFFERENTIAL (CANCER CENTER ONLY)
BASO#: 0 10*3/uL (ref 0.0–0.2)
BASO%: 0.4 % (ref 0.0–2.0)
EOS%: 1.8 % (ref 0.0–7.0)
Eosinophils Absolute: 0.2 10*3/uL (ref 0.0–0.5)
HCT: 34.6 % — ABNORMAL LOW (ref 34.8–46.6)
HGB: 11.4 g/dL — ABNORMAL LOW (ref 11.6–15.9)
LYMPH#: 1.9 10*3/uL (ref 0.9–3.3)
LYMPH%: 19.9 % (ref 14.0–48.0)
MCH: 30.5 pg (ref 26.0–34.0)
MCHC: 32.9 g/dL (ref 32.0–36.0)
MCV: 93 fL (ref 81–101)
MONO#: 0.6 10*3/uL (ref 0.1–0.9)
MONO%: 6 % (ref 0.0–13.0)
NEUT#: 6.8 10*3/uL — ABNORMAL HIGH (ref 1.5–6.5)
NEUT%: 71.9 % (ref 39.6–80.0)
PLATELETS: 252 10*3/uL (ref 145–400)
RBC: 3.74 10*6/uL (ref 3.70–5.32)
RDW: 15.7 % (ref 11.1–15.7)
WBC: 9.5 10*3/uL (ref 3.9–10.0)

## 2017-05-22 LAB — IRON AND TIBC
%SAT: 17 % — ABNORMAL LOW (ref 21–57)
IRON: 57 ug/dL (ref 41–142)
TIBC: 330 ug/dL (ref 236–444)
UIBC: 273 ug/dL (ref 120–384)

## 2017-05-22 LAB — FERRITIN: Ferritin: 146 ng/ml (ref 9–269)

## 2017-05-22 LAB — RETICULOCYTES: RETICULOCYTE COUNT: 2.2 % (ref 0.6–2.6)

## 2017-05-22 NOTE — Progress Notes (Signed)
Hematology and Oncology Follow Up Visit  MITZI LILJA [id number] 29-Oct-1955 61 y.o. 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 Ronk is here today for follow-up.  We last saw her, she was anemic.  We found that she was on iron deficient.  Her iron studies showed a ferritin of 20 with iron saturation of only 14%.  She is having a lot of diarrhea.  She is on quite a few medications for her diabetes.  I suspect that she has iron malabsorption because of all these medications and also from omeprazole.  She is on Eliquis.  As such, she may have some intermittent GI blood loss.  After getting the iron, she felt much better.  She just has a lot of diarrhea.  I suspect this might be from her metformin.  She is looking forward to Thanksgiving and Christmas.  She will be making the Thanksgiving [place].  Overall, her performance status is ECOG 1.  Medications:  Allergies as of 05/22/2017      Reactions   Other Nausea And Vomiting, Swelling   Shrimp if eat a lot   Cleocin [clindamycin Hcl] Other (See Comments)   "irritated my esophagus"   Iodine    Other reaction(s): Other (See Comments)   Morphine And Related Nausea And Vomiting   Pneumococcal Vaccines Other (See Comments)   Really high fever, and flu symptoms. MD instructed not to give   Buprenorphine Hcl Nausea And Vomiting      Medication List        Accurate as of 05/22/17 11:15 AM. Always use your most recent med list.          acetaminophen 500 MG tablet Commonly known as:  TYLENOL Take 500 mg every 6 (six) hours as needed by mouth.   albuterol 108 (90 Base) MCG/ACT inhaler Commonly known as:  PROVENTIL HFA;VENTOLIN HFA Inhale 2 puffs into the lungs as needed for wheezing or shortness of  breath.   atorvastatin 10 MG tablet Commonly known as:  LIPITOR TAKE 1 TABLET ONCE A DAY (AT BEDTIME)   carvedilol 6.25 MG tablet Commonly known as:  COREG Take 6.25 mg by mouth 2 (two) times daily with a meal.   ELIQUIS 5 MG Tabs tablet Generic drug:  apixaban Take 5 mg by mouth 2 (two) times daily.   flecainide 50 MG tablet Commonly known as:  TAMBOCOR Take 50 mg by mouth 2 (two) times daily.   fluticasone 50 MCG/ACT nasal spray Commonly known as:  FLONASE Place 1 spray into both nostrils 2 (two) times daily.   glipiZIDE 5 MG 24 hr tablet Commonly known as:  GLUCOTROL XL Take 5-10 mg by mouth 2 (two) times daily. 2 tablets every morning and 1 tablet every evening   lisinopril-hydrochlorothiazide 10-12.5 MG tablet Commonly known as:  PRINZIDE,ZESTORETIC Take 1 tablet by mouth daily.   loratadine 10 MG tablet Commonly known as:  CLARITIN Take 10 mg by mouth daily as needed for allergies.   metFORMIN 1000 MG tablet Commonly known as:  GLUCOPHAGE Take 1,000 mg by mouth 2 (two) times daily with a meal.   montelukast 10 MG tablet Commonly known as:  SINGULAIR Take 1 tablet (10 mg total) by mouth at bedtime.   nitrofurantoin 50 MG capsule Commonly known as:  MACRODANTIN Take 50  mg by mouth at bedtime.   omeprazole 20 MG capsule Commonly known as:  PRILOSEC Take 20 mg by mouth.   sertraline 50 MG tablet Commonly known as:  ZOLOFT Take 50 mg by mouth daily.   sitaGLIPtin 100 MG tablet Commonly known as:  JANUVIA Take 100 mg by mouth daily.       Allergies:  Allergies  Allergen Reactions  . Other Nausea And Vomiting and Swelling    Shrimp if eat a lot  . Cleocin [Clindamycin Hcl] Other (See Comments)    "irritated my esophagus"  . Iodine     Other reaction(s): Other (See Comments)  . Morphine And Related Nausea And Vomiting  . Pneumococcal Vaccines Other (See Comments)    Really high fever, and flu symptoms. MD instructed not to give  . Buprenorphine  Hcl Nausea And Vomiting    Past Medical History, Surgical history, Social history, and Family History were reviewed and updated.  Review of Systems: As stated in the interim history  Physical Exam:  weight is 146 lb 4 oz (66.3 kg). Her oral temperature is 98.3 F (36.8 C). Her blood pressure is 136/78 and her pulse is 77. Her respiration is 20 and oxygen saturation is 98%.   Wt Readings from Last 3 Encounters:  05/22/17 146 lb 4 oz (66.3 kg)  04/17/17 147 lb (66.7 kg)  01/18/17 150 lb (68 kg)    I examined Ms. Kitzmiller today.  The findings on my examination are noted below with appropriate changes:   Well-developed and well-nourished white female in no obvious distress. Head and neck exam shows no ocular or oral lesions. There are no palpable cervical or supraclavicular lymph nodes. Lungs are clear bilaterally. Cardiac exam regular rate and rhythm with no murmurs, rubs or bruits. Breast exam shows right breast no masses, edema or erythema. There is no right axillary adenopathy. Left breast is somewhat contracted from surgery and radiation. She has a well-healed lumpectomy scar at about the 9:00 position. There is some firmness at the lumpectomy site. She has a well-healed left axillary lymphadenectomy scar. Abdomen is soft. She is good bowel sounds pretty there is no fluid wave. There is no palpable liver or spleen tip. Back exam shows no tenderness over the spine, ribs or hips. Extremities shows no clubbing, cyanosis or edema. There is no lymphedema of the left arm. Skin exam shows no rashes, ecchymoses or petechia. Neurological exam shows no focal neurological deficits.   Lab Results  Component Value Date   WBC 9.5 05/22/2017   HGB 11.4 (L) 05/22/2017   HCT 34.6 (L) 05/22/2017   MCV 93 05/22/2017   PLT 252 05/22/2017   Lab Results  Component Value Date   FERRITIN 20 04/17/2017   IRON 51 04/17/2017   TIBC 363 04/17/2017   UIBC 312 04/17/2017   IRONPCTSAT 14 (L) 04/17/2017   Lab  Results  Component Value Date   RBC 3.74 05/22/2017   No results found for: Nils Pyle [facility] Lab Results  Component Value Date   IGGSERUM 789 11/15/2016   IGA 150 11/15/2016   IGMSERUM 86 11/15/2016   No results found for: Ronnald Ramp, A1GS, A2GS, Violet Baldy, MSPIKE, SPEI   Chemistry      Component Value Date/Time   NA 144 04/17/2017 1150   NA 141 04/18/2016 1256   K 4.6 04/17/2017 1150   K 4.4 04/18/2016 1256   CL 109 (H) 04/17/2017 1150   CO2 23 04/17/2017 1150   CO2  18 (L) 04/18/2016 1256   BUN 28 (H) 04/17/2017 1150   BUN 27.3 (H) 04/18/2016 1256   CREATININE 1.3 (H) 04/17/2017 1150   CREATININE 1.4 (H) 04/18/2016 1256      Component Value Date/Time   CALCIUM 9.6 04/17/2017 1150   CALCIUM 9.4 04/18/2016 1256   ALKPHOS 62 04/17/2017 1150   ALKPHOS 68 04/18/2016 1256   AST 24 04/17/2017 1150   AST 14 04/18/2016 1256   ALT 20 04/17/2017 1150   ALT 23 04/18/2016 1256   BILITOT 0.40 04/17/2017 1150   BILITOT <0.22 04/18/2016 1256     Impression and Plan: Ms. Vangorden is a 61 year old white female. She has a history of a stage I ductal carcinoma of the left breast. She is now 15 years out. I really don't think this is going to be a problem for her.  The issue now is her iron deficiency.  Her hemoglobin is improved with IV iron.  She also has an element of erythropoietin deficiency and we may have to consider Aranesp if we cannot get her hemoglobin upon enough.  I am just happy that she is feeling better.  We will go ahead and plan to get her back in 6 months.  If she needs iron before then, we will let her know.  I am just glad that she is feeling a little bit better.  I would like to see her feel better for the holidays.  [name], MD 11/19/201811:15 AM

## 2017-07-24 ENCOUNTER — Ambulatory Visit: Payer: Federal, State, Local not specified - PPO | Admitting: Internal Medicine

## 2017-07-24 ENCOUNTER — Encounter: Payer: Self-pay | Admitting: Internal Medicine

## 2017-07-24 VITALS — BP 126/58 | HR 107 | Temp 98.4°F | Ht 59.75 in | Wt 150.8 lb

## 2017-07-24 DIAGNOSIS — J45991 Cough variant asthma: Secondary | ICD-10-CM | POA: Diagnosis not present

## 2017-07-24 HISTORY — DX: Cough variant asthma: J45.991

## 2017-07-24 NOTE — Progress Notes (Signed)
Subjective:     Patient ID: [name], female   DOB: August 17, 1955,    MRN: [id number]  HPI  59 yowf never smoker with onset of resp problems in 20s started with nasal symptoms then freq pna with initial eval pos for allergies in her 30's on shots x sev years  and some better but recurrent "pna"  Each fall wih last eval by Dr [name] 01/18/17   PFT 01/18/17: FVC 1.72 L (62%) FEV1 1.29 L (61%) FEV1/FVC 0.75  negative bronchodilator response   ERV 32% DLCO corrected 74%  IMAGING CT CHEST W/O 11/23/16 (personally reviewed by me):  Minimal dependent atelectasis in the bases. No parenchymal nodule or opacity appreciated. No pleural effusion or thickening. No pericardial effusion. No pathologic mediastinal adenopathy.  LABS 11/15/16: IgG: 789 IgA: 150 IgM: 86 IgE: 618 RAST panel: [place] 0.26/[place] 0.11/Aspergillus fumigatus 1.84 CBC: 11.8/11.4/35.0/266 Eosinophils: 0.3      history of chronic bronchitis and remote diagnosis of asthma. At last appointment she had symptoms consistent with nonseasonal chronic allergic rhinitis as well as GERD. Lung volumes performed today shows no significant bronchodilator response but does show mild restrictive lung diseaseLikely due to her mild central obesity.  Her reflux seems to be controlled at this time. Her chronic allergic rhinitis is suboptimally controlled and would benefit from regular, daily use of intranasal corticosteroids. I instructed the patient to notify me if she had any new breathing problems or questions before next appointment.  1. Simple chronic bronchitis:Likely due to previous exacerbations of asthma. 2. Mild, persistent asthma: Continuing patient on Singulair 10 mg by mouth daily at bedtime. Continuing albuterol inhaler as needed. 3. Chronic nonseasonal allergic rhinitis: Patient intolerant of intranasal saline rinses. Recommended using Flonase 1 spray in each nostril twice daily. Continuing Singulair and Claritin. 4. GERD: Controlled  with Prilosec. No new medications at this time. 5. Mild restrictive lung disease: Likely secondary to obesity without evidence of fibrotic changes on CT imaging. No new testing. 6. Health maintenance: Status post influenza vaccine 2017. She is allergic to the Pneumovax.  7. Follow-up: Return to clinic in 6 months or sooner if needed.   07/24/2017  Transition of care ext ov/[name] re: establish re cough variant asthma vs uacs  ON ACEi Chief Complaint  Patient presents with  . Follow-up    Increased cough and SOB for the past 2 wks. She is having come sinus pressure, HA and left ear pain today. She is not producing any sputum.  She has been using her albuterol inhaler 2 x daily on average.   trending better over the last two weeks p "caught another cold" >>still having sensation of pnds  On chronic ACIEi/ cough worse when wakes up in am but s excess/ purulent sputum or mucus plugs  And usually not needing saba at all but presently at least 2x daily never noct  No obvious day to day or daytime variability or assoc excess/ purulent sputum or mucus plugs or hemoptysis or cp or chest tightness, subjective wheeze or overt sinus or hb symptoms. No unusual exposure hx or h/o childhood pna/ asthma or knowledge of premature birth.  Sleeping ok flat without nocturnal  or early am exacerbation  of respiratory  c/o's or need for noct saba. Also denies any obvious fluctuation of symptoms with weather or environmental changes or other aggravating or alleviating factors except as outlined above   Current Allergies, Complete Past Medical History, Past Surgical History, Family History, and Social History were reviewed in  [facility] electronic medical record.  ROS  The following are not active complaints unless bolded Hoarseness, sore throat, dysphagia, dental problems, itching, sneezing,  nasal congestion or discharge of excess mucus or purulent secretions, ear ache,   fever, chills, sweats, unintended wt loss or  wt gain, classically pleuritic or exertional cp,  orthopnea pnd or leg swelling, presyncope, palpitations, abdominal pain, anorexia, nausea, vomiting, diarrhea  or change in bowel habits or change in bladder habits, change in stools or change in urine, dysuria, hematuria,  rash, arthralgias, visual complaints, headache, numbness, weakness or ataxia or problems with walking or coordination,  change in mood/affect or memory.        Current Meds  Medication Sig  . acetaminophen (TYLENOL) 500 MG tablet Take 500 mg every 6 (six) hours as needed by mouth.  Marland Kitchen albuterol (PROVENTIL HFA;VENTOLIN HFA) 108 (90 BASE) MCG/ACT inhaler Inhale 2 puffs into the lungs as needed for wheezing or shortness of breath.   Marland Kitchen atorvastatin (LIPITOR) 10 MG tablet TAKE 1 TABLET ONCE A DAY (AT BEDTIME)  . carvedilol (COREG) 6.25 MG tablet Take 6.25 mg by mouth 2 (two) times daily with a meal.   . ELIQUIS 5 MG TABS tablet Take 5 mg by mouth 2 (two) times daily.  . flecainide (TAMBOCOR) 50 MG tablet Take 50 mg by mouth 2 (two) times daily.   . fluticasone (FLONASE) 50 MCG/ACT nasal spray Place 1 spray into both nostrils 2 (two) times daily.  Marland Kitchen glipiZIDE (GLUCOTROL XL) 5 MG 24 hr tablet Take 5-10 mg by mouth 2 (two) times daily. 2 tablets every morning and 1 tablet every evening  . lisinopril-hydrochlorothiazide (PRINZIDE,ZESTORETIC) 10-12.5 MG per tablet Take 1 tablet by mouth daily.   Marland Kitchen loratadine (CLARITIN) 10 MG tablet Take 10 mg by mouth daily as needed for allergies.  . metFORMIN (GLUCOPHAGE) 1000 MG tablet Take 1,000 mg by mouth 2 (two) times daily with a meal.  . montelukast (SINGULAIR) 10 MG tablet Take 1 tablet (10 mg total) by mouth at bedtime.  Marland Kitchen omeprazole (PRILOSEC) 20 MG capsule Take 20 mg by mouth daily.   . sertraline (ZOLOFT) 50 MG tablet Take 50 mg by mouth daily.  . sitaGLIPtin (JANUVIA) 100 MG tablet Take 100 mg by mouth daily.            Review of Systems     Objective:   Physical Exam amb wf  with freq throat clearing   Wt Readings from Last 3 Encounters:  07/24/17 150 lb 12.8 oz (68.4 kg)  05/22/17 146 lb 4 oz (66.3 kg)  04/17/17 147 lb (66.7 kg)     Vital signs reviewed - Note on arrival 02 sats  98% on RA     HEENT: nl dentition, turbinates bilaterally, and oropharynx which is pristine. Nl external ear canals without cough reflex   NECK :  without JVD/Nodes/TM/ nl carotid upstrokes bilaterally   LUNGS: no acc muscle use,  Nl contour chest which is clear to A and P bilaterally without cough on insp or exp maneuvers   CV:  RRR  no s3 or murmur or increase in P2, and no edema   ABD:  soft and nontender with nl inspiratory excursion in the supine position. No bruits or organomegaly appreciated, bowel sounds nl  MS:  Nl gait/ ext warm without deformities, calf tenderness, cyanosis or clubbing No obvious joint restrictions   SKIN: warm and dry without lesions    NEURO:  alert, approp, nl sensorium with  no motor or cerebellar deficits apparent.         Assessment:

## 2017-07-24 NOTE — Assessment & Plan Note (Addendum)
Singulair trial 11/15/16 >  Improved  01/18/17: FVC 1.72 L (62%) FEV1 1.29 L (61%) FEV1/FVC 0.75 Still on ACEi as of 07/24/2017   Absence of noct cough and persistence of perceived need for sab c/w DDX of  difficult airways management almost all start with A and  include Adherence, Ace Inhibitors, Acid Reflux, Active Sinus Disease, Alpha 1 Antitripsin deficiency, Anxiety masquerading as Airways dz,  ABPA,  Allergy(esp in young), Aspiration (esp in elderly), Adverse effects of meds,  Active smokers, A bunch of PE's (a small clot burden can't cause this syndrome unless there is already severe underlying pulm or vascular dz with poor reserve) plus two Bs  = Bronchiectasis and Beta blocker use..and one C= CHF   Adherence is always the initial "prime suspect" and is a multilayered concern that requires a "trust but verify" approach in every patient - starting with knowing how to use medications, especially inhalers, correctly, keeping up with refills and understanding the fundamental difference between maintenance and prns vs those medications only taken for a very short course and then stopped and not refilled.   ACEi at the top of usual list of suspects > consider trial off as next step/ defer to PCP  ? Acid (or non-acid) GERD > always difficult to exclude as up to 75% of pts in some series report no assoc GI/ Heartburn symptoms> rec continue max (24h)  acid suppression and diet restrictions/ reviewed    - Of the three most common causes of  Sub-acute or recurrent or chronic cough, only one (GERD)  can actually contribute to/ trigger  the other two (asthma and post nasal drip syndrome)  and perpetuate the cylce of cough. While not intuitively obvious, many patients with chronic low grade reflux do not cough until there is a primary insult that disturbs the protective epithelial barrier and exposes sensitive nerve endings.   This is typically viral but can be direct physical injury such as with an  endotracheal tube.   The point is that once this occurs, it is difficult to eliminate the cycle  using anything but a maximally effective acid suppression regimen at least in the short run, accompanied by an appropriate diet to address non acid GERD and control / eliminate the cough itself if possible.  For now rec gerd rx/ diet when coughing   ? Allergy   >  Allergy profile from 11/2016 reviewed > doing better on singulair > continue indefinitely plus flonase  ? Active sinus dz/ rhinitis with pnds > 1st gen H1 blockers per guidelines    ? Beta blocker effects > In the setting of respiratory symptoms of unknown etiology,  It would be preferable to use bystolic, the most beta -1  selective Beta blocker available in sample form, with bisoprolol the most selective generic choice  on the market, at least on a trial basis, to make sure the spillover Beta 2 effects of the less specific Beta blockers are not contributing to this patient's symptoms.    No need to return if symptoms all resolved on singulair and albuterol dep back to baseline of < 2 x weekly   However, if not then needs trial off acei next    I had an extended discussion with the patient reviewing all relevant studies completed to date and  lasting 25 minutes of a 40  minute extended transition of care office visit with pt new to me addressing  non-specific but potentially very serious refractory respiratory symptoms of uncertain and  potentially multiple  etiologies.   Each maintenance medication was reviewed in detail including most importantly the difference between maintenance and prns and under what circumstances the prns are to be triggered using an action plan format that is not reflected in the computer generated alphabetically organized AVS.    Please see AVS for specific instructions unique to this office visit that I personally wrote and verbalized to the the pt in detail and then reviewed with pt  by my nurse highlighting any  changes in therapy/plan of care  recommended at today's visit.

## 2017-07-24 NOTE — Patient Instructions (Addendum)
When you have any respiratory flare:   Change the prilosec to take it 30 minutes before your first meal and add pepcid 20 mg at bedtime and do this until no cough x 1 week off all cough medications   For drainage / throat tickle try take CHLORPHENIRAMINE  4 mg - take one every 4 hours as needed - available over the counter- may cause drowsiness so start with just a bedtime dose or two and see how you tolerate it before trying in daytime     If not doing well  with respiratory symptoms and needing the albuterol more than twice a week then  the next step is to find an alternative to your lisinopril per [facility] then return after 6 weeks.

## 2017-10-29 DIAGNOSIS — J020 Streptococcal pharyngitis: Secondary | ICD-10-CM

## 2017-10-29 DIAGNOSIS — E871 Hypo-osmolality and hyponatremia: Secondary | ICD-10-CM

## 2017-10-29 DIAGNOSIS — R509 Fever, unspecified: Secondary | ICD-10-CM

## 2017-10-29 DIAGNOSIS — A419 Sepsis, unspecified organism: Secondary | ICD-10-CM | POA: Insufficient documentation

## 2017-10-29 HISTORY — DX: Streptococcal pharyngitis: J02.0

## 2017-10-29 HISTORY — DX: Hypo-osmolality and hyponatremia: E87.1

## 2017-10-29 HISTORY — DX: Fever, unspecified: R50.9

## 2017-11-20 ENCOUNTER — Ambulatory Visit: Payer: Federal, State, Local not specified - PPO | Admitting: Family

## 2017-11-20 ENCOUNTER — Inpatient Hospital Stay: Payer: Federal, State, Local not specified - PPO | Attending: Hematology & Oncology

## 2018-01-12 ENCOUNTER — Other Ambulatory Visit: Payer: Self-pay | Admitting: Hematology & Oncology

## 2018-01-12 DIAGNOSIS — Z1231 Encounter for screening mammogram for malignant neoplasm of breast: Secondary | ICD-10-CM

## 2018-01-30 ENCOUNTER — Other Ambulatory Visit: Payer: Self-pay | Admitting: Internal Medicine

## 2018-01-30 MED ORDER — FLUTICASONE PROPIONATE 50 MCG/ACT NA SUSP: 1.0000 | Freq: Two times a day (BID) | NASAL | 0 refills | Status: DC

## 2018-02-05 ENCOUNTER — Other Ambulatory Visit: Payer: Self-pay | Admitting: Internal Medicine

## 2018-02-05 MED ORDER — MONTELUKAST SODIUM 10 MG PO TABS: 10.0000 mg | ORAL_TABLET | Freq: Every day | ORAL | 0 refills | Status: DC

## 2018-02-07 ENCOUNTER — Ambulatory Visit
Admission: RE | Admit: 2018-02-07 | Discharge: 2018-02-07 | Disposition: A | Discharge status: Home / Self Care | Payer: Federal, State, Local not specified - PPO | Source: Ambulatory Visit | Attending: Hematology & Oncology | Admitting: Hematology & Oncology

## 2018-02-07 DIAGNOSIS — Z1231 Encounter for screening mammogram for malignant neoplasm of breast: Secondary | ICD-10-CM

## 2018-02-27 ENCOUNTER — Other Ambulatory Visit: Payer: Self-pay | Admitting: Internal Medicine

## 2018-04-04 ENCOUNTER — Other Ambulatory Visit: Payer: Self-pay | Admitting: Internal Medicine

## 2018-04-09 DIAGNOSIS — M25579 Pain in unspecified ankle and joints of unspecified foot: Secondary | ICD-10-CM

## 2018-04-09 DIAGNOSIS — M79673 Pain in unspecified foot: Secondary | ICD-10-CM

## 2018-04-09 HISTORY — DX: Pain in unspecified ankle and joints of unspecified foot: M25.579

## 2018-04-09 HISTORY — DX: Pain in unspecified foot: M79.673

## 2018-05-01 DIAGNOSIS — M545 Low back pain, unspecified: Secondary | ICD-10-CM | POA: Insufficient documentation

## 2018-05-01 HISTORY — DX: Low back pain, unspecified: M54.50

## 2018-05-02 ENCOUNTER — Other Ambulatory Visit: Payer: Self-pay | Admitting: Internal Medicine

## 2018-05-16 DIAGNOSIS — M533 Sacrococcygeal disorders, not elsewhere classified: Secondary | ICD-10-CM | POA: Insufficient documentation

## 2018-05-16 HISTORY — DX: Sacrococcygeal disorders, not elsewhere classified: M53.3

## 2018-06-05 ENCOUNTER — Other Ambulatory Visit: Payer: Self-pay | Admitting: Cardiology

## 2018-06-05 MED ORDER — ELIQUIS 5 MG PO TABS: 5.0000 mg | ORAL_TABLET | Freq: Two times a day (BID) | ORAL | 0 refills | Status: DC

## 2018-06-05 NOTE — Telephone Encounter (Signed)
° ° °  1. Which medications need to be refilled? (please list name of each medication and dose if known) Eloquis 5mg  tablet 1BID  2. Which pharmacy/location (including street and city if local pharmacy) is medication to be sent to? [facility] [address] [place]  3. Do they need a 30 day or 90 day supply? [facility]

## 2018-06-05 NOTE — Telephone Encounter (Signed)
Eliquis 5 mg twice daily refilled per Dr. Bettina Gavia. Patient scheduled to be seen by our office in January. Patient aware.

## 2018-06-14 ENCOUNTER — Other Ambulatory Visit: Payer: Self-pay | Admitting: Internal Medicine

## 2018-06-14 MED ORDER — MONTELUKAST SODIUM 10 MG PO TABS: ORAL_TABLET | ORAL | 0 refills | Status: DC

## 2018-06-28 ENCOUNTER — Encounter: Payer: Self-pay | Admitting: Nurse Practitioner

## 2018-06-28 ENCOUNTER — Ambulatory Visit: Payer: Federal, State, Local not specified - PPO | Admitting: Nurse Practitioner

## 2018-06-28 VITALS — BP 118/86 | HR 75 | Temp 99.0°F | Ht 59.75 in | Wt 158.2 lb

## 2018-06-28 DIAGNOSIS — R0602 Shortness of breath: Secondary | ICD-10-CM

## 2018-06-28 DIAGNOSIS — J45991 Cough variant asthma: Secondary | ICD-10-CM | POA: Diagnosis not present

## 2018-06-28 MED ORDER — MONTELUKAST SODIUM 10 MG PO TABS: ORAL_TABLET | ORAL | 0 refills | Status: DC

## 2018-06-28 MED ORDER — AMOXICILLIN-POT CLAVULANATE 875-125 MG PO TABS: 1.0000 | ORAL_TABLET | Freq: Two times a day (BID) | ORAL | 0 refills | Status: DC

## 2018-06-28 MED ORDER — ALBUTEROL SULFATE HFA 108 (90 BASE) MCG/ACT IN AERS: 2.0000 | INHALATION_SPRAY | RESPIRATORY_TRACT | 0 refills | Status: DC | PRN

## 2018-06-28 NOTE — Assessment & Plan Note (Signed)
Patient spirometry and walk were normal in office today.  Patient Instructions  Walked in office today - patient remained at 98% on RA the entire walk Spirometry in office today - stable from PFT 2 years ago Will order amoxicillin for sinusitis Will refill albuterol Please keep upcoming appointment with Cardiology Please follow up with Dr. Melvyn Novas at his first available appt in around 4 weeks May follow up sooner or go to the ED if symptoms worsen.

## 2018-06-28 NOTE — Patient Instructions (Addendum)
Walked in office today - patient remained at 98% on RA the entire walk Spirometry in office today - stable from PFT 2 years ago Will order amoxicillin for sinusitis Will refill albuterol Please keep upcoming appointment with Cardiology Please follow up with Dr. Melvyn Novas at his first available appt in around 4 weeks May follow up sooner or go to the ED if symptoms worsen.

## 2018-06-28 NOTE — Progress Notes (Signed)
@Patient  ID: [name], female    DOB: August 24, 1955, 62 y.o.   MRN: [id number]  Chief Complaint  Patient presents with  . Shortness of Breath    Referring provider: Enid Skeens., MD  HPI 62 year old female never smoker with cough variant asthma/restrictive lung disease by Dr. Melvyn Novas. PMH includes CHF, HTN, A. Fib  Tests: CT chest 11/23/16 - No significant airspace consolidation or bronchial wall thickening. Areas of linear atelectasis are noted at the lung bases and within the lingula. Extensive coronary artery disease. Aortic Atherosclerosis.  PFT:  PFT Results Latest Ref Rng & Units 01/18/2017  FVC-Pre L 1.72  FVC-Predicted Pre % 62  FVC-Post L 1.67  FVC-Predicted Post % 61  Pre FEV1/FVC % % 75  Post FEV1/FCV % % 80  FEV1-Pre L 1.29  FEV1-Predicted Pre % 61  FEV1-Post L 1.34  DLCO UNC% % 65  DLCO COR %Predicted % 101  TLC L 3.31  TLC % Predicted % 75  RV % Predicted % 90    OV 06/28/18 - shortness of breath Patient presents today with shortness of breath.  States that this is been ongoing for several months.  He states that over the past month she has noticed increased shortness of breath with exertion.  She is not short of breath at rest.  She was last seen by Dr. Melvyn Novas on 07/24/2017.  Patient is compliant with Claritin, Flonase, and Singulair.  She has upcoming appointment with her cardiologist in 10 days for A. fib and CHF.  He does not have any peripheral edema in office today but states that she has noticed her ankle swelling at times.  She does also complain of some sinus congestion pressure and pain.  Does have postnasal drip.  Denies any significant cough.  Denies any fever or chest pain.  She states that she has ran out of her rescue inhaler and is requesting a refill today.  Note: Patient was walked in office today and her sats remained at 98% on room air throughout the walk. Spirometry in office today was stable from PFF 2 years ago.       Allergies    Allergen Reactions  . Other Nausea And Vomiting and Swelling    Shrimp if eat a lot  . Cleocin [Clindamycin Hcl] Other (See Comments)    "irritated my esophagus"  . Iodine     Other reaction(s): Other (See Comments)  . Morphine And Related Nausea And Vomiting  . Pneumococcal Vaccines Other (See Comments)    Really high fever, and flu symptoms. MD instructed not to give  . Buprenorphine Hcl Nausea And Vomiting    Immunization History  Administered Date(s) Administered  . Influenza Whole 04/03/2017  . Influenza,inj,Quad PF,6+ Mos 05/21/2017  . Influenza-Unspecified 03/04/2013, 04/15/2014, 03/16/2015, 03/02/2016, 04/29/2017    Past Medical History:  Diagnosis Date  . Arthritis    "back, knees, arms, wrists" (05/15/2013)  . Asthma   . Breast cancer ([place])   . CHF (congestive heart failure) ([place])    "mild" (05/15/2013)  . Chronic bronchitis ([place])   . Chronic lower back pain   . Dysrhythmia    atrial fib/dr [facility]  . Family history of anesthesia complication    "my mother also had PONV" (05/15/2013)  . GERD (gastroesophageal reflux disease)   . Heart murmur   . High cholesterol   . Kidney stones   . Migraine    "haven't had one in the early 2000's" (05/15/2013)  .  Pneumonia    "used to be chronic; last time I had it was 2013" (05/15/2013)  . PONV (postoperative nausea and vomiting)   . Recurrent UTI (urinary tract infection)    "from the continuous kidney stones; take Macrodantin qd" (05/15/2013)  . Sepsis ([place]) 05   kidney stone infection  . Tension headache   . Type II diabetes mellitus (H[facility])     Tobacco History: Social History   Tobacco Use  Smoking Status Passive Smoke Exposure - Never Smoker  Smokeless Tobacco Never Used  Tobacco Comment   Mother & Father smoked   Counseling given: Yes Comment: Mother & Father smoked   Outpatient Encounter Medications as of 06/28/2018  Medication Sig  . acetaminophen (TYLENOL) 500 MG tablet Take  500 mg every 6 (six) hours as needed by mouth.  Marland Kitchen albuterol (PROVENTIL HFA;VENTOLIN HFA) 108 (90 Base) MCG/ACT inhaler Inhale 2 puffs into the lungs as needed for wheezing or shortness of breath.  . ALPRAZolam (XANAX) 0.25 MG tablet alprazolam 0.25 mg tablet  TAKE 1 TABLET BY MOUTH 3 TIMES A DAY  . atorvastatin (LIPITOR) 10 MG tablet TAKE 1 TABLET ONCE A DAY (AT BEDTIME)  . carvedilol (COREG) 6.25 MG tablet Take 6.25 mg by mouth 2 (two) times daily with a meal.   . ELIQUIS 5 MG TABS tablet Take 1 tablet (5 mg total) by mouth 2 (two) times daily.  . flecainide (TAMBOCOR) 50 MG tablet Take 50 mg by mouth 2 (two) times daily.   . fluticasone (FLONASE) 50 MCG/ACT nasal spray PLACE 1 SPRAY INTO BOTH NOSTRILS 2 (TWO) TIMES DAILY.  Marland Kitchen glipiZIDE (GLUCOTROL XL) 5 MG 24 hr tablet Take 5-10 mg by mouth 2 (two) times daily. 2 tablets every morning and 1 tablet every evening  . ipratropium (ATROVENT) 0.06 % nasal spray Place into the nose.  Marland Kitchen lisinopril-hydrochlorothiazide (PRINZIDE,ZESTORETIC) 10-12.5 MG per tablet Take 1 tablet by mouth daily.   Marland Kitchen loratadine (CLARITIN) 10 MG tablet Take 10 mg by mouth daily as needed for allergies.  Marland Kitchen losartan-hydrochlorothiazide (HYZAAR) 50-12.5 MG tablet losartan 50 mg-hydrochlorothiazide 12.5 mg tablet  . metFORMIN (GLUCOPHAGE) 1000 MG tablet Take 1,000 mg by mouth 2 (two) times daily with a meal.  . montelukast (SINGULAIR) 10 MG tablet TAKE 1 TABLET BY MOUTH EVERYDAY AT BEDTIME  . omeprazole (PRILOSEC) 20 MG capsule Take 20 mg by mouth daily.   . phenazopyridine (PYRIDIUM) 200 MG tablet phenazopyridine 200 mg tablet  . predniSONE (DELTASONE) 10 MG tablet prednisone 10 mg tablet  PLEASE SEE ATTACHED FOR DETAILED DIRECTIONS  . sertraline (ZOLOFT) 50 MG tablet Take 50 mg by mouth daily.  . sitaGLIPtin (JANUVIA) 100 MG tablet Take 100 mg by mouth daily.  . [DISCONTINUED] albuterol (PROVENTIL HFA;VENTOLIN HFA) 108 (90 BASE) MCG/ACT inhaler Inhale 2 puffs into the lungs as  needed for wheezing or shortness of breath.   . [DISCONTINUED] montelukast (SINGULAIR) 10 MG tablet TAKE 1 TABLET BY MOUTH EVERYDAY AT BEDTIME  . amoxicillin-clavulanate (AUGMENTIN) 875-125 MG tablet Take 1 tablet by mouth 2 (two) times daily.   No facility-administered encounter medications on file as of 06/28/2018.      Review of Systems  Review of Systems  Constitutional: Negative.  Negative for chills and fever.  HENT: Negative.   Respiratory: Positive for shortness of breath. Negative for cough and wheezing.   Cardiovascular: Negative.  Negative for chest pain, palpitations and leg swelling.  Gastrointestinal: Negative.   Allergic/Immunologic: Negative.   Neurological: Negative.   Psychiatric/Behavioral: Negative.  Physical Exam  BP 118/86 (BP Location: Right Arm, Patient Position: Sitting, Cuff Size: Normal)   Pulse 75   Temp 99 F (37.2 C)   Ht 4' 11.75" (1.518 m)   Wt 158 lb 3.2 oz (71.8 kg)   SpO2 97%   BMI 31.16 kg/m   Wt Readings from Last 5 Encounters:  06/28/18 158 lb 3.2 oz (71.8 kg)  07/24/17 150 lb 12.8 oz (68.4 kg)  05/22/17 146 lb 4 oz (66.3 kg)  04/17/17 147 lb (66.7 kg)  01/18/17 150 lb (68 kg)     Physical Exam Vitals signs and nursing note reviewed.  Constitutional:      General: She is not in acute distress.    Appearance: She is well-developed.  HENT:     Nose:     Right Sinus: Maxillary sinus tenderness and frontal sinus tenderness present.     Left Sinus: Maxillary sinus tenderness and frontal sinus tenderness present.  Cardiovascular:     Rate and Rhythm: Normal rate and regular rhythm.  Pulmonary:     Effort: Pulmonary effort is normal.     Breath sounds: Normal breath sounds. No decreased breath sounds, wheezing or rhonchi.  Neurological:     Mental Status: She is alert and oriented to person, place, and time.       Assessment & Plan:   Cough variant asthma vs uacs  Patient spirometry and walk were normal in office  today.  Patient Instructions  Walked in office today - patient remained at 98% on RA the entire walk Spirometry in office today - stable from PFT 2 years ago Will order amoxicillin for sinusitis Will refill albuterol Please keep upcoming appointment with Cardiology Please follow up with Dr. Melvyn Novas at his first available appt in around 4 weeks May follow up sooner or go to the ED if symptoms worsen.        Fenton Foy, NP 06/28/2018

## 2018-07-02 NOTE — Progress Notes (Signed)
Chart and office note reviewed in detail  > agree with a/p as outlined    

## 2018-07-10 ENCOUNTER — Other Ambulatory Visit: Payer: Self-pay | Admitting: *Deleted

## 2018-07-10 ENCOUNTER — Encounter: Payer: Self-pay | Admitting: Cardiology

## 2018-07-10 ENCOUNTER — Ambulatory Visit: Payer: Federal, State, Local not specified - PPO | Admitting: Cardiology

## 2018-07-10 VITALS — BP 118/62 | HR 92 | Ht 59.75 in | Wt 159.2 lb

## 2018-07-10 DIAGNOSIS — E782 Mixed hyperlipidemia: Secondary | ICD-10-CM | POA: Diagnosis not present

## 2018-07-10 DIAGNOSIS — I10 Essential (primary) hypertension: Secondary | ICD-10-CM

## 2018-07-10 DIAGNOSIS — I480 Paroxysmal atrial fibrillation: Secondary | ICD-10-CM | POA: Diagnosis not present

## 2018-07-10 MED ORDER — MONTELUKAST SODIUM 10 MG PO TABS: ORAL_TABLET | ORAL | 0 refills | Status: DC

## 2018-07-10 NOTE — Patient Instructions (Signed)
Medication Instructions:   Your physician recommends that you continue on your current medications as directed. Please refer to the Current Medication list given to you today.  If you need a refill on your cardiac medications before your next appointment, please call your pharmacy.   Lab work:  NONE  If you have labs (blood work) drawn today and your tests are completely normal, you will receive your results only by: Marland Kitchen MyChart Message (if you have MyChart) OR . A paper copy in the mail If you have any lab test that is abnormal or we need to change your treatment, we will call you to review the results.  Testing/Procedures:  Your physician has requested that you have an echocardiogram. Echocardiography is a painless test that uses sound waves to create images of your heart. It provides your doctor with information about the size and shape of your heart and how well your heart's chambers and valves are working. This procedure takes approximately one hour. There are no restrictions for this procedure.    Follow-Up: At [facility], you and your health needs are our priority.  As part of our continuing mission to provide you with exceptional heart care, we have created designated Provider Care Teams.  These Care Teams include your primary Cardiologist (physician) and Advanced Practice Providers (APPs -  Physician Assistants and Nurse Practitioners) who all work together to provide you with the care you need, when you need it. You will need a follow up appointment in 6 months.  You may see Dr. Agustin Cree or another member of our [place] Provider Team in [place]: Shirlee More, MD . Jyl Heinz, MD

## 2018-07-10 NOTE — Addendum Note (Signed)
Addended by: Orland Penman on: 07/10/2018 11:32 AM   Modules accepted: Orders

## 2018-07-10 NOTE — Progress Notes (Signed)
Cardiology Office Note:    Date:  07/10/2018   ID:  [name], DOB 1956-06-20, MRN [id number]  PCP:  Enid Skeens., MD  Cardiologist:  Jenne Campus, MD    Referring MD: Enid Skeens., MD   Chief Complaint  Patient presents with  . Follow-up  Doing well  History of Present Illness:    [name] is a 63 y.o. female with paroxysmal atrial fibrillation, successfully maintained on flecainide.  Also anticoagulated.  Comes today to office for follow-up overall doing well she is recovering from flu that she got about 2 weeks ago no chest pain tightness squeezing pressure been chest no palpitations.  Past Medical History:  Diagnosis Date  . Arthritis    "back, knees, arms, wrists" (05/15/2013)  . Asthma   . Breast cancer ([place])   . CHF (congestive heart failure) ([place])    "mild" (05/15/2013)  . Chronic bronchitis ([place])   . Chronic lower back pain   . Dysrhythmia    atrial fib/dr [facility]  . Family history of anesthesia complication    "my mother also had PONV" (05/15/2013)  . GERD (gastroesophageal reflux disease)   . Heart murmur   . High cholesterol   . Kidney stones   . Migraine    "haven't had one in the early 2000's" (05/15/2013)  . Pneumonia    "used to be chronic; last time I had it was 2013" (05/15/2013)  . PONV (postoperative nausea and vomiting)   . Recurrent UTI (urinary tract infection)    "from the continuous kidney stones; take Macrodantin qd" (05/15/2013)  . Sepsis ([place]) 05   kidney stone infection  . Tension headache   . Type II diabetes mellitus ([place])     Past Surgical History:  Procedure Laterality Date  . 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 BREAST BIOPSY Left 02/2001  . BREAST LUMPECTOMY Left 02/2001  . BREAST LUMPECTOMY Left 09/23/2013   Procedure: LEFT LUMPECTOMY WITH SPECIMEN MAMMOGRAM;  Surgeon: Adin Hector, MD;   Location: [facility];  Service: General;  Laterality: Left;  . COLONOSCOPY    . DIAGNOSTIC LAPAROSCOPY     cyst-near ovary  . LITHOTRIPSY     "2-3 times prior to 2002" (05/15/2013)  . SPINAL FUSION N/A 05/08/2013   Procedure: T9-S1 INSTRUMENTED FUSION T12 -S1 DECOMPRESSION;  Surgeon: Melina Schools, MD;  Location: [facility];  Service: Orthopedics;  Laterality: N/A;  . TUBAL LIGATION Bilateral 1985    Current Medications: Current Meds  Medication Sig  . acetaminophen (TYLENOL) 500 MG tablet Take 500 mg every 6 (six) hours as needed by mouth.  Marland Kitchen albuterol (PROVENTIL HFA;VENTOLIN HFA) 108 (90 Base) MCG/ACT inhaler Inhale 2 puffs into the lungs as needed for wheezing or shortness of breath.  . ALPRAZolam (XANAX) 0.25 MG tablet alprazolam 0.25 mg tablet  TAKE 1 TABLET BY MOUTH 3 TIMES A DAY  . atorvastatin (LIPITOR) 10 MG tablet TAKE 1 TABLET ONCE A DAY (AT BEDTIME)  . carvedilol (COREG) 6.25 MG tablet Take 6.25 mg by mouth 2 (two) times daily with a meal.   . ELIQUIS 5 MG TABS tablet Take 1 tablet (5 mg total) by mouth 2 (two) times daily.  . flecainide (TAMBOCOR) 50 MG tablet Take 50 mg by mouth 2 (two) times daily.   . fluticasone (FLONASE) 50 MCG/ACT nasal spray PLACE 1 SPRAY INTO BOTH NOSTRILS  2 (TWO) TIMES DAILY.  Marland Kitchen glipiZIDE (GLUCOTROL XL) 5 MG 24 hr tablet Take 10 mg by mouth daily with breakfast.   . loratadine (CLARITIN) 10 MG tablet Take 10 mg by mouth daily as needed for allergies.  Marland Kitchen losartan-hydrochlorothiazide (HYZAAR) 50-12.5 MG tablet losartan 50 mg-hydrochlorothiazide 12.5 mg tablet  . metFORMIN (GLUCOPHAGE) 1000 MG tablet Take 1,000 mg by mouth 2 (two) times daily with a meal.  . montelukast (SINGULAIR) 10 MG tablet TAKE 1 TABLET BY MOUTH EVERYDAY AT BEDTIME  . omeprazole (PRILOSEC) 20 MG capsule Take 20 mg by mouth daily.   . sertraline (ZOLOFT) 50 MG tablet Take 50 mg by mouth daily.  . sitaGLIPtin (JANUVIA) 100 MG tablet Take 100 mg by mouth daily.      Allergies:   Other; Cleocin [clindamycin hcl]; Morphine and related; Pneumococcal vaccines; and Buprenorphine hcl   Social History   Socioeconomic History  . Marital status: Married    Spouse name: Not on file  . Number of children: Not on file  . Years of education: Not on file  . Highest education level: Not on file  Occupational History  . Not on file  Social Needs  . Financial resource strain: Not on file  . Food insecurity:    Worry: Not on file    Inability: Not on file  . Transportation needs:    Medical: Not on file    Non-medical: Not on file  Tobacco Use  . Smoking status: Passive Smoke Exposure - Never Smoker  . Smokeless tobacco: Never Used  . Tobacco comment: Mother & Father smoked  Substance and Sexual Activity  . Alcohol use: No    Alcohol/week: 0.0 standard drinks  . Drug use: No  . Sexual activity: Yes  Lifestyle  . Physical activity:    Days per week: Not on file    Minutes per session: Not on file  . Stress: Not on file  Relationships  . Social connections:    Talks on phone: Not on file    Gets together: Not on file    Attends religious service: Not on file    Active member of club or organization: Not on file    Attends meetings of clubs or organizations: Not on file    Relationship status: Not on file  Other Topics Concern  . Not on file  Social History Narrative   [facility] (11/15/16):   Patient's originally from [place]. Has always lived in [place]. Currently has an outside dog. Remote exposure to [place] when her children were young. No known mold exposure. 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      Family History: The patient's family history includes Breast cancer in her mother; COPD in her mother; Environmental Allergies in her son and another family member; Heart disease in her mother; Kidney cancer in her father; Liver cancer in her brother; Lupus in an other family  member. ROS:   Please see the history of present illness.    All 14 point review of systems negative except as described per history of present illness  EKGs/Labs/Other Studies Reviewed:      Recent Labs: No results found for requested labs within last 8760 hours.  Recent Lipid Panel    Component Value Date/Time   TRIG 155 (H) 05/08/2013 2023    Physical Exam:    VS:  BP 118/62   Pulse 92   Ht 4' 11.75" (1.518 m)   Wt  159 lb 3.2 oz (72.2 kg)   SpO2 98%   BMI 31.35 kg/m     Wt Readings from Last 3 Encounters:  07/10/18 159 lb 3.2 oz (72.2 kg)  06/28/18 158 lb 3.2 oz (71.8 kg)  07/24/17 150 lb 12.8 oz (68.4 kg)     GEN:  Well nourished, well developed in no acute distress HEENT: Normal NECK: No JVD; No carotid bruits LYMPHATICS: No lymphadenopathy CARDIAC: RRR, no murmurs, no rubs, no gallops RESPIRATORY:  Clear to auscultation without rales, wheezing or rhonchi  ABDOMEN: Soft, non-tender, non-distended MUSCULOSKELETAL:  No edema; No deformity  SKIN: Warm and dry LOWER EXTREMITIES: no swelling NEUROLOGIC:  Alert and oriented x 3 PSYCHIATRIC:  Normal affect   ASSESSMENT:    1. Paroxysmal atrial fibrillation (HCC)   2. Essential hypertension   3. Mixed hyperlipidemia    PLAN:    In order of problems listed above:  1. Paroxysmal atrial fibrillation we will do EKG to confirm the rhythm.  She is on flecainide and I will continue for now she is also anticoagulated with Eliquis which I will continue.  I discussed with her dose medication I stressed importance of taking those on the regular basis.  We also discussed the safety of flecainide.  I will ask her to have echocardiogram to check left ventricular ejection fraction as well as to assess left atrial size. 2. Essential hypertension blood pressure well controlled continue present medications. 3. Mixed dyslipidemia she is on statin which I will continue follow-up by internal medicine team.  If her echocardiogram  showed enlarged left atrium we will do 30 days monitor to see how much of atrial fibrillation she has   Medication Adjustments/Labs and Tests Ordered: Current medicines are reviewed at length with the patient today.  Concerns regarding medicines are outlined above.  No orders of the defined types were placed in this encounter.  Medication changes: No orders of the defined types were placed in this encounter.   Signed, Park Liter, MD, [facility] 07/10/2018 [place]

## 2018-07-17 ENCOUNTER — Other Ambulatory Visit: Payer: Self-pay | Admitting: Cardiology

## 2018-07-24 DIAGNOSIS — R3915 Urgency of urination: Secondary | ICD-10-CM | POA: Insufficient documentation

## 2018-07-24 DIAGNOSIS — N3946 Mixed incontinence: Secondary | ICD-10-CM | POA: Insufficient documentation

## 2018-07-24 HISTORY — DX: Mixed incontinence: N39.46

## 2018-07-24 HISTORY — DX: Urgency of urination: R39.15

## 2018-07-26 ENCOUNTER — Encounter: Payer: Self-pay | Admitting: Internal Medicine

## 2018-07-26 ENCOUNTER — Ambulatory Visit: Payer: Federal, State, Local not specified - PPO | Admitting: Internal Medicine

## 2018-07-26 VITALS — BP 116/74 | HR 78 | Ht 59.75 in | Wt 159.6 lb

## 2018-07-26 DIAGNOSIS — J45991 Cough variant asthma: Secondary | ICD-10-CM | POA: Diagnosis not present

## 2018-07-26 DIAGNOSIS — R0609 Other forms of dyspnea: Secondary | ICD-10-CM

## 2018-07-26 DIAGNOSIS — D649 Anemia, unspecified: Secondary | ICD-10-CM

## 2018-07-26 LAB — CBC WITH DIFFERENTIAL/PLATELET
Basophils Absolute: 0.1 10*3/uL (ref 0.0–0.1)
Basophils Relative: 0.9 % (ref 0.0–3.0)
Eosinophils Absolute: 1.1 10*3/uL — ABNORMAL HIGH (ref 0.0–0.7)
Eosinophils Relative: 12.3 % — ABNORMAL HIGH (ref 0.0–5.0)
HCT: 31.9 % — ABNORMAL LOW (ref 36.0–46.0)
Hemoglobin: 10.3 g/dL — ABNORMAL LOW (ref 12.0–15.0)
Lymphocytes Relative: 23.8 % (ref 12.0–46.0)
Lymphs Abs: 2.2 10*3/uL (ref 0.7–4.0)
MCHC: 32.2 g/dL (ref 30.0–36.0)
MCV: 95.1 fl (ref 78.0–100.0)
Monocytes Absolute: 0.4 10*3/uL (ref 0.1–1.0)
Monocytes Relative: 4.7 % (ref 3.0–12.0)
Neutro Abs: 5.3 10*3/uL (ref 1.4–7.7)
Neutrophils Relative %: 58.3 % (ref 43.0–77.0)
Platelets: 212 10*3/uL (ref 150.0–400.0)
RBC: 3.35 Mil/uL — AB (ref 3.87–5.11)
RDW: 15.6 % — ABNORMAL HIGH (ref 11.5–15.5)
WBC: 9 10*3/uL (ref 4.0–10.5)

## 2018-07-26 NOTE — Patient Instructions (Addendum)
When you have any respiratory flare with cough or wheezing/ short of breath   In addition to  the prilosec to take it 30 minutes before your first meal and add pepcid 20 mg after supper  and do this until no cough x 1 week off all cough medications   For drainage / throat tickle try take CHLORPHENIRAMINE  4 mg - take one every 4 hours as needed - available over the counter- may cause drowsiness so start with a dose or two one hour before  and see how you tolerate it before trying in daytime    GERD (REFLUX)  is an extremely common cause of respiratory symptoms just like yours , many times with no obvious heartburn at all.    It can be treated with medication, but also with lifestyle changes including elevation of the head of your bed (ideally with 6 -8inch blocks under the headboard of your bed),  Smoking cessation, avoidance of late meals, excessive alcohol, and avoid fatty foods, chocolate, peppermint, colas, red wine, and acidic juices such as orange juice.  NO MINT OR MENTHOL PRODUCTS SO NO COUGH DROPS  USE SUGARLESS CANDY INSTEAD (Jolley ranchers or Stover's or Life Savers) or even ice chips will also do - the key is to swallow to prevent all throat clearing. NO OIL BASED VITAMINS - use powdered substitutes.  Avoid fish oil when coughing.   If not satisfied with cough or breathing after your cardiac eval is complete.  Please remember to go to the lab department   for your tests - we will call you with the results when they are available.

## 2018-07-26 NOTE — Progress Notes (Signed)
Subjective:     Patient ID: [name], female   DOB: 10-Jan-1956,    MRN: [id number]    Brief patient profile:  5 yowf never smoker with onset of resp problems in 20s started with nasal symptoms then freq pna with initial eval pos for allergies in her 30's on shots x sev years  and some better but recurrent "pna"  Each fall wih last eval by [name] 01/18/17     PFT 01/18/17: FVC 1.72 L (62%) FEV1 1.29 L (61%) FEV1/FVC 0.75  negative bronchodilator response   ERV 32% DLCO corrected 74%  IMAGING CT CHEST W/O 11/23/16  Minimal dependent atelectasis in the bases. No parenchymal nodule or opacity appreciated. No pleural effusion or thickening. No pericardial effusion. No pathologic mediastinal adenopathy.  LABS 11/15/16: IgG: 789 IgA: 150 IgM: 86 IgE: 618 RAST panel: [place] 0.26/[place] 0.11/Aspergillus fumigatus 1.84 CBC: 11.8/11.4/35.0/266 Eosinophils: 0.3      history of chronic bronchitis and remote diagnosis of asthma. At last appointment she had symptoms consistent with nonseasonal chronic allergic rhinitis as well as GERD. Lung volumes performed today shows no significant bronchodilator response but does show mild restrictive lung diseaseLikely due to her mild central obesity.  Her reflux seems to be controlled at this time. Her chronic allergic rhinitis is suboptimally controlled and would benefit from regular, daily use of intranasal corticosteroids. I instructed the patient to notify me if she had any new breathing problems or questions before next appointment.  1. Simple chronic bronchitis:Likely due to previous exacerbations of asthma. 2. Mild, persistent asthma: Continuing patient on Singulair 10 mg by mouth daily at bedtime. Continuing albuterol inhaler as needed. 3. Chronic nonseasonal allergic rhinitis: Patient intolerant of intranasal saline rinses. Recommended using Flonase 1 spray in each nostril twice daily. Continuing Singulair and Claritin. 4. GERD: Controlled with  Prilosec. No new medications at this time. 5. Mild restrictive lung disease: Likely secondary to obesity without evidence of fibrotic changes on CT imaging. No new testing. 6. Health maintenance: Status post influenza vaccine 2017. She is allergic to the Pneumovax.  7. Follow-up: Return to clinic in 6 months or sooner if needed.   07/24/2017  Transition of care extended  ov/[name] re: establish re cough variant asthma vs uacs  ON ACEi Chief Complaint  Patient presents with  . Follow-up    Increased cough and SOB for the past 2 wks. She is having come sinus pressure, HA and left ear pain today. She is not producing any sputum.  She has been using her albuterol inhaler 2 x daily on average.   trending better over the last two weeks p "caught another cold" >>still having sensation of pnds  On chronic ACIEi/ cough worse when wakes up in am but s excess/ purulent sputum or mucus plugs  And usually not needing saba at all but presently at least 2x daily never noct rec When you have any respiratory flare:   Change the prilosec to take it 30 minutes before your first meal and add pepcid 20 mg at bedtime and do this until no cough x 1 week off all cough medications  For drainage / throat tickle try take CHLORPHENIRAMINE  4 mg - take one every 4 hours as needed - available over the counter- may cause drowsiness so start with just a bedtime dose or two and see how you tolerate it before trying in daytime   If not doing well  with respiratory symptoms and needing the albuterol more than twice  a week then  the next step is to find an alternative to your lisinopril per [facility] then return after 6 weeks.   06/28/18 NP rec [name] in office today - patient remained at 98% on RA the entire walk Spirometry in office today - stable from PFT 2 years ago Will order amoxicillin for sinusitis Will refill albuterol Please keep upcoming appointment with Cardiology Please follow up with [name] at his first available  appt in around 4 weeks     07/26/2018  f/u ov/[name] re: uacs 100% better after last ov  Then ? Flu like illness 06/30/18 tamiflu Chief Complaint  Patient presents with  . Follow-up    SOB with exertion, cough with some mucus but now non-productive  Dyspnea:  Across parking lot  / no variability  Cough: at hs / min mucus Sleeping: pillows not bed blocks SABA use: once a week uses but really makes no difference  02: none     No obvious day to day or daytime variability or assoc excess/ purulent sputum or mucus plugs or hemoptysis or cp or chest tightness, subjective wheeze or overt sinus or hb symptoms.   Sleeping ok p 30-60 min without nocturnal  or early am exacerbation  of respiratory  c/o's or need for noct saba. Also denies any obvious fluctuation of symptoms with weather or environmental changes or other aggravating or alleviating factors except as outlined above   No unusual exposure hx or h/o childhood pna/ asthma or knowledge of premature birth.  Current Allergies, Complete Past Medical History, Past Surgical History, Family History, and Social History were reviewed in [employer] record.  ROS  The following are not active complaints unless bolded Hoarseness, sore throat, dysphagia, dental problems, itching, sneezing,  nasal congestion or discharge of excess mucus or purulent secretions, ear ache,   fever, chills, sweats, unintended wt loss or wt gain, classically pleuritic or exertional cp,  orthopnea pnd or arm/hand swelling  or leg swelling, presyncope, palpitations, abdominal pain, anorexia, nausea, vomiting, diarrhea  or change in bowel habits or change in bladder habits, change in stools or change in urine, dysuria, hematuria,  rash, arthralgias, visual complaints, headache, numbness, weakness or ataxia or problems with walking or coordination,  change in mood or  memory.        Current Meds  Medication Sig  . acetaminophen (TYLENOL) 500 MG tablet  Take 500 mg every 6 (six) hours as needed by mouth.  Marland Kitchen albuterol (PROVENTIL HFA;VENTOLIN HFA) 108 (90 Base) MCG/ACT inhaler Inhale 2 puffs into the lungs as needed for wheezing or shortness of breath.  . ALPRAZolam (XANAX) 0.25 MG tablet alprazolam 0.25 mg tablet  TAKE 1 TABLET BY MOUTH 3 TIMES A DAY  . atorvastatin (LIPITOR) 10 MG tablet TAKE 1 TABLET ONCE A DAY (AT BEDTIME)  . carvedilol (COREG) 6.25 MG tablet Take 6.25 mg by mouth 2 (two) times daily with a meal.   . ELIQUIS 5 MG TABS tablet TAKE 1 TABLET BY MOUTH TWICE A DAY  . flecainide (TAMBOCOR) 50 MG tablet Take 50 mg by mouth 2 (two) times daily.   . fluticasone (FLONASE) 50 MCG/ACT nasal spray PLACE 1 SPRAY INTO BOTH NOSTRILS 2 (TWO) TIMES DAILY.  Marland Kitchen glipiZIDE (GLUCOTROL XL) 5 MG 24 hr tablet Take 10 mg by mouth daily with breakfast.   . loratadine (CLARITIN) 10 MG tablet Take 10 mg by mouth daily as needed for allergies.  Marland Kitchen losartan-hydrochlorothiazide (HYZAAR) 50-12.5 MG tablet losartan 50 mg-hydrochlorothiazide  12.5 mg tablet  . metFORMIN (GLUCOPHAGE) 1000 MG tablet Take 1,000 mg by mouth 2 (two) times daily with a meal.  . montelukast (SINGULAIR) 10 MG tablet TAKE 1 TABLET BY MOUTH EVERYDAY AT BEDTIME  . omeprazole (PRILOSEC) 20 MG capsule Take 20 mg by mouth daily.   . sertraline (ZOLOFT) 50 MG tablet Take 50 mg by mouth daily.  . sitaGLIPtin (JANUVIA) 100 MG tablet Take 100 mg by mouth daily.            Objective:   Physical Exam      07/26/2018       159   07/24/17 150 lb 12.8 oz (68.4 kg)  05/22/17 146 lb 4 oz (66.3 kg)  04/17/17 147 lb (66.7 kg)    amb wf with freq throat clearing   Vital signs reviewed - Note on arrival 02 sats  98% on RA      HEENT: nl dentition, turbinates bilaterally, and oropharynx which is again pristine. Nl external ear canals without cough reflex   NECK :  without JVD/Nodes/TM/ nl carotid upstrokes bilaterally   LUNGS: no acc muscle use,  Nl contour chest which is clear to A and P  bilaterally without cough on insp or exp maneuvers   CV:  RRR  no s3 or murmur or increase in P2, and no edema   ABD:  soft and nontender with nl inspiratory excursion in the supine position. No bruits or organomegaly appreciated, bowel sounds nl  MS:  Nl gait/ ext warm without deformities, calf tenderness, cyanosis or clubbing No obvious joint restrictions   SKIN: warm and dry without lesions    NEURO:  alert, approp, nl sensorium with  no motor or cerebellar deficits apparent.     Labs ordered 07/26/2018  Allergy profile     Assessment:

## 2018-07-27 ENCOUNTER — Encounter: Payer: Self-pay | Admitting: Internal Medicine

## 2018-07-27 DIAGNOSIS — R0609 Other forms of dyspnea: Secondary | ICD-10-CM

## 2018-07-27 DIAGNOSIS — D649 Anemia, unspecified: Secondary | ICD-10-CM | POA: Insufficient documentation

## 2018-07-27 DIAGNOSIS — R0600 Dyspnea, unspecified: Secondary | ICD-10-CM

## 2018-07-27 HISTORY — DX: Anemia, unspecified: D64.9

## 2018-07-27 HISTORY — DX: Other forms of dyspnea: R06.09

## 2018-07-27 HISTORY — DX: Dyspnea, unspecified: R06.00

## 2018-07-27 LAB — RESPIRATORY ALLERGY PROFILE REGION II ~~LOC~~
Allergen, A. alternata, m6: <0.1 kU/L
Allergen, Cedar tree, t12: <0.1 kU/L
Allergen, Comm Silver Birch, t9: <0.1 kU/L
Allergen, Cottonwood, t14: <0.1 kU/L
Allergen, Mouse Urine Protein, e78: <0.1 kU/L
Allergen, Oak,t7: <0.1 kU/L
Allergen, P. notatum, m1: <0.1 kU/L
Aspergillus fumigatus, m3: 1.09 kU/L — ABNORMAL HIGH
Bermuda Grass: <0.1 kU/L
Box Elder IgE: <0.1 kU/L
CLASS: 0
CLASS: 0
CLASS: 0
CLASS: 0
CLASS: 0
Cat Dander: <0.1 kU/L
Class: 0
Class: 0
Class: 0
Class: 0
Class: 0
Class: 0
Class: 0
Class: 0
Class: 0
Class: 0
Class: 0
Class: 0
Class: 0
Class: 0
Class: 0
Class: 0
Class: 0
Class: 0
Class: 2
Cockroach: <0.1 kU/L
D. farinae: <0.1 kU/L
Dog Dander: <0.1 kU/L
Elm IgE: <0.1 kU/L
IgE (Immunoglobulin E), Serum: 420 kU/L — ABNORMAL HIGH (ref <=114)
Johnson Grass: <0.1 kU/L
Pecan/Hickory Tree IgE: <0.1 kU/L
Rough Pigweed  IgE: <0.1 kU/L
Sheep Sorrel IgE: <0.1 kU/L
Timothy Grass: <0.1 kU/L

## 2018-07-27 LAB — INTERPRETATION:

## 2018-07-27 NOTE — Progress Notes (Signed)
Spoke with pt and notified of results per Dr. [name]. Pt verbalized understanding and denied any questions. 

## 2018-07-27 NOTE — Assessment & Plan Note (Addendum)
Singulair trial 11/15/16 >  Improved 01/18/17:  FEV1 1.29 L (61%)  Ratio 75  Still on ACEi as of 07/24/2017  Spirometry 06/28/18   FEV1 1.3 (61%)  Ratio 77 with min curvature   - Allergy profile 07/26/2018 >  Eos 1.2 /  IgE pending     I suspect the problem is allergic rhintis with pnds triggering secondary gerd but can't exclude cough variant asthma.  I don't think the doe is a pulmonary issue, however (see separate a/p) - for now focus on rx of uacs with max rx for gerd and 1st gen H1 blockers per guidelines    If this does indeed prove not to be asthma next step is a refer back to allergy for eval of rhinitis.   Discussed in detail all the  indications, usual  risks and alternatives  relative to the benefits with patient who agrees to proceed with w/u as outlined.

## 2018-07-27 NOTE — Assessment & Plan Note (Signed)
Onset early 2019 assoc with uacs while on ACEi - 06/28/18    Walked RA x one lap =  approx 250 ft - stopped due to sob with sats still high 90's    Symptoms are markedly disproportionate to objective findings and not clear to what extent this is actually a pulmonary  problem but pt does appear to have difficult to sort out respiratory symptoms of unknown origin for which  DDX  = almost all start with A and  include Adherence, Ace Inhibitors, Acid Reflux, Active Sinus Disease, Alpha 1 Antitripsin deficiency, Anxiety masquerading as Airways dz,  ABPA,  Allergy(esp in young), Aspiration (esp in elderly), Adverse effects of meds,  Active smoking or Vaping, A bunch of PE's/clot burden (a few small clots can't cause this syndrome unless there is already severe underlying pulm or vascular dz with poor reserve),  Anemia or thyroid disorder, plus two Bs  = Bronchiectasis and Beta blocker use..and one C= CHF     Adherence is always the initial "prime suspect" and is a multilayered concern that requires a "trust but verify" approach in every patient - starting with knowing how to use medications, especially inhalers, correctly, keeping up with refills and understanding the fundamental difference between maintenance and prns vs those medications only taken for a very short course and then stopped and not refilled.  - return p cards w/u with all meds in hand using a trust but verify approach to confirm accurate Medication  Reconciliation The principal here is that until we are certain that the  patients are doing what we've asked, it makes no sense to ask them to do more.   ? Acid (or non-acid) GERD > always difficult to exclude as up to 75% of pts in some series report no assoc GI/ Heartburn symptoms> rec max (24h)  acid suppression and diet restrictions/ reviewed and instructions given in writing.   ? Allergy >  Continue singulair, send allergy eval, consider formal referral (see uacs)  ? Adverse drug effects >  none listed  ? Anxiety/depression/ deconditioning > usually at the bottom of this list of usual suspects but should be much higher on this pt's based on H and P and note already on psychotropics and may interfere with adherence and also interpretation of response or lack thereof to symptom management which can be quite subjective.   ? Anemia/thyroid dz >  She is anemic (see sep a/p)/ needs TSH  ? A bunchd of PE's > unlikely on eliquis  ? BB effects > unlikely on such low doses of coreg  ? Cardiac causes > cards w/u in progress, should return to this clinic when complete if not improved.

## 2018-07-27 NOTE — Assessment & Plan Note (Signed)
  Lab Results  Component Value Date   HGB 10.3 (L) 07/26/2018   HGB 11.4 (L) 05/22/2017   HGB 10.2 (L) 04/17/2017   HGB 11.4 (L) 11/15/2016   HGB 10.9 (L) 04/18/2016   HGB 12.9 03/07/2007     Trending gradually down typical of anemia of chronic dz/ will need w/u if not already done elsewhere but defer for now to PCP.    I had an extended discussion with the patient reviewing all relevant studies completed to date and  lasting 25 minutes of a 40  minute extended office  visit addressing multiple  non-specific but potentially very serious refractory respiratory symptoms of uncertain etiologies.    Each maintenance medication was reviewed in detail including most importantly the difference between maintenance and prns and under what circumstances the prns are to be triggered using an action plan format that is not reflected in the computer generated alphabetically organized AVS.    Please see AVS for specific instructions unique to this office visit that I personally wrote and verbalized to the the pt in detail and then reviewed with pt  by my nurse highlighting any changes in therapy/plan of care  recommended at today's visit.

## 2018-07-31 ENCOUNTER — Telehealth: Payer: Self-pay | Admitting: Internal Medicine

## 2018-07-31 NOTE — Telephone Encounter (Signed)
Results have been faxed-and patient is aware. Nothing more needed at this time.

## 2018-08-06 ENCOUNTER — Other Ambulatory Visit: Payer: Self-pay | Admitting: Internal Medicine

## 2018-08-08 ENCOUNTER — Other Ambulatory Visit: Payer: Federal, State, Local not specified - PPO

## 2018-08-08 ENCOUNTER — Ambulatory Visit: Payer: Federal, State, Local not specified - PPO | Admitting: Family

## 2018-08-17 ENCOUNTER — Other Ambulatory Visit: Payer: Self-pay | Admitting: Cardiology

## 2018-08-17 ENCOUNTER — Other Ambulatory Visit: Payer: Self-pay | Admitting: Family

## 2018-08-17 DIAGNOSIS — N183 Chronic kidney disease, stage 3 unspecified: Secondary | ICD-10-CM

## 2018-08-17 DIAGNOSIS — Z853 Personal history of malignant neoplasm of breast: Secondary | ICD-10-CM

## 2018-08-17 DIAGNOSIS — D500 Iron deficiency anemia secondary to blood loss (chronic): Secondary | ICD-10-CM

## 2018-08-17 DIAGNOSIS — D631 Anemia in chronic kidney disease: Secondary | ICD-10-CM

## 2018-08-20 ENCOUNTER — Encounter: Payer: Self-pay | Admitting: Family

## 2018-08-20 ENCOUNTER — Inpatient Hospital Stay: Payer: Federal, State, Local not specified - PPO | Attending: Hematology & Oncology

## 2018-08-20 ENCOUNTER — Telehealth: Payer: Self-pay | Admitting: Family

## 2018-08-20 ENCOUNTER — Other Ambulatory Visit: Payer: Self-pay

## 2018-08-20 ENCOUNTER — Inpatient Hospital Stay (HOSPITAL_BASED_OUTPATIENT_CLINIC_OR_DEPARTMENT_OTHER): Payer: Federal, State, Local not specified - PPO | Admitting: Family

## 2018-08-20 VITALS — BP 110/56 | HR 91 | Temp 98.6°F | Resp 18 | Wt 155.0 lb

## 2018-08-20 DIAGNOSIS — D509 Iron deficiency anemia, unspecified: Secondary | ICD-10-CM | POA: Diagnosis not present

## 2018-08-20 DIAGNOSIS — N183 Chronic kidney disease, stage 3 (moderate): Secondary | ICD-10-CM

## 2018-08-20 DIAGNOSIS — Z853 Personal history of malignant neoplasm of breast: Secondary | ICD-10-CM

## 2018-08-20 DIAGNOSIS — D500 Iron deficiency anemia secondary to blood loss (chronic): Secondary | ICD-10-CM

## 2018-08-20 DIAGNOSIS — D631 Anemia in chronic kidney disease: Secondary | ICD-10-CM

## 2018-08-20 LAB — CBC WITH DIFFERENTIAL (CANCER CENTER ONLY)
Abs Immature Granulocytes: 0.02 10*3/uL (ref 0.00–0.07)
Basophils Absolute: 0.1 10*3/uL (ref 0.0–0.1)
Basophils Relative: 1 %
EOS ABS: 0.3 10*3/uL (ref 0.0–0.5)
Eosinophils Relative: 3 %
HCT: 35.1 % — ABNORMAL LOW (ref 36.0–46.0)
Hemoglobin: 11 g/dL — ABNORMAL LOW (ref 12.0–15.0)
Immature Granulocytes: 0 %
LYMPHS ABS: 1.7 10*3/uL (ref 0.7–4.0)
Lymphocytes Relative: 15 %
MCH: 30.6 pg (ref 26.0–34.0)
MCHC: 31.3 g/dL (ref 30.0–36.0)
MCV: 97.5 fL (ref 80.0–100.0)
Monocytes Absolute: 0.6 10*3/uL (ref 0.1–1.0)
Monocytes Relative: 5 %
Neutro Abs: 9 10*3/uL — ABNORMAL HIGH (ref 1.7–7.7)
Neutrophils Relative %: 76 %
Platelet Count: 289 10*3/uL (ref 150–400)
RBC: 3.6 MIL/uL — ABNORMAL LOW (ref 3.87–5.11)
RDW: 14.1 % (ref 11.5–15.5)
WBC Count: 11.8 10*3/uL — ABNORMAL HIGH (ref 4.0–10.5)
nRBC: 0 % (ref 0.0–0.2)

## 2018-08-20 LAB — RETICULOCYTES
Immature Retic Fract: 15.4 % (ref 2.3–15.9)
RBC.: 3.6 MIL/uL — ABNORMAL LOW (ref 3.87–5.11)
Retic Count, Absolute: 66.2 10*3/uL (ref 19.0–186.0)
Retic Ct Pct: 1.8 % (ref 0.4–3.1)

## 2018-08-20 NOTE — Telephone Encounter (Signed)
Appointments scheduled avs/calendar printed per 2/17 los

## 2018-08-20 NOTE — Progress Notes (Signed)
Hematology and Oncology Follow Up Visit  [name] [id number] [birth year] 63 y.o. 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 is here today for follow-up. We last saw her in November 2018. She is symptomatic at this time with fatigue, SOB with over exertion and increased palpitations with atrial fib.  She denies any episodes of bleeding on Eliquis for the a-fib. No bruising or petechiae.  She will nap some during the day sometimes and then have a hard time sleeping at night.  Her mammogram in August was negative. No changes on breast exam.  No fever, chills, n/v, cough, rash, dizziness, chest pain, abdominal pain or changes in bowel or bladder habits.  She has occasionally puffiness in her ankles when on her feet for an extended period of times.  She has numbness and tingling in her hands and feet that waxes and wanes. She states that this is due to chronic neck issues.  No lymphadenopathy noted on exam.  She has maintained a good appetite and is staying well hydrated. Her weight is stable.   ECOG Performance Status: 1 - Symptomatic but completely ambulatory  Medications:  Allergies as of 08/20/2018      Reactions   Other Nausea And Vomiting, Swelling   Shrimp if eat a lot   Cleocin [clindamycin Hcl] Other (See Comments)   "irritated my esophagus"   Morphine And Related Nausea And Vomiting   Pneumococcal Vaccines Other (See Comments)   Really high fever, and flu symptoms. MD instructed not to give   Buprenorphine Hcl Nausea And Vomiting      Medication List       Accurate as of August 20, 2018 11:40 AM. Always use your most recent med list.        acetaminophen 500 MG tablet Commonly known as:  TYLENOL Take 500 mg every 6 (six) hours as needed by  mouth.   albuterol 108 (90 Base) MCG/ACT inhaler Commonly known as:  PROVENTIL HFA;VENTOLIN HFA Inhale 2 puffs into the lungs as needed for wheezing or shortness of breath.   ALPRAZolam 0.25 MG tablet Commonly known as:  XANAX alprazolam 0.25 mg tablet  TAKE 1 TABLET BY MOUTH 3 TIMES A DAY   atorvastatin 10 MG tablet Commonly known as:  LIPITOR TAKE 1 TABLET ONCE A DAY (AT BEDTIME)   carvedilol 6.25 MG tablet Commonly known as:  COREG Take 6.25 mg by mouth 2 (two) times daily with a meal.   ELIQUIS 5 MG Tabs tablet Generic drug:  apixaban TAKE 1 TABLET BY MOUTH TWICE A DAY   flecainide 50 MG tablet Commonly known as:  TAMBOCOR Take 50 mg by mouth 2 (two) times daily.   fluticasone 50 MCG/ACT nasal spray Commonly known as:  FLONASE PLACE 1 SPRAY INTO BOTH NOSTRILS 2 (TWO) TIMES DAILY.   glipiZIDE 5 MG 24 hr tablet Commonly known as:  GLUCOTROL XL Take 10 mg by mouth daily with breakfast.   loratadine 10 MG tablet Commonly known as:  CLARITIN Take 10 mg by mouth daily as needed for allergies.   losartan-hydrochlorothiazide 50-12.5 MG tablet Commonly known as:  HYZAAR losartan 50 mg-hydrochlorothiazide 12.5 mg tablet   metFORMIN 1000 MG tablet Commonly known as:  GLUCOPHAGE Take 1,000 mg by mouth 2 (two) times daily with a meal.  montelukast 10 MG tablet Commonly known as:  SINGULAIR TAKE 1 TABLET BY MOUTH EVERYDAY AT BEDTIME   omeprazole 20 MG capsule Commonly known as:  PRILOSEC Take 20 mg by mouth daily.   sertraline 50 MG tablet Commonly known as:  ZOLOFT Take 50 mg by mouth daily.   sitaGLIPtin 100 MG tablet Commonly known as:  JANUVIA Take 100 mg by mouth daily.       Allergies:  Allergies  Allergen Reactions  . Other Nausea And Vomiting and Swelling    Shrimp if eat a lot  . Cleocin [Clindamycin Hcl] Other (See Comments)    "irritated my esophagus"  . Morphine And Related Nausea And Vomiting  . Pneumococcal Vaccines Other (See Comments)      Really high fever, and flu symptoms. MD instructed not to give  . Buprenorphine Hcl Nausea And Vomiting    Past Medical History, Surgical history, Social history, and Family History were reviewed and updated.  Review of Systems: All other 10 point review of systems is negative.   Physical Exam:  weight is 155 lb (70.3 kg). Her oral temperature is 98.6 F (37 C). Her blood pressure is 110/56 (abnormal) and her pulse is 91. Her respiration is 18 and oxygen saturation is 100%.   Wt Readings from Last 3 Encounters:  08/20/18 155 lb (70.3 kg)  07/26/18 159 lb 9.6 oz (72.4 kg)  07/10/18 159 lb 3.2 oz (72.2 kg)    Ocular: Sclerae unicteric, pupils equal, round and reactive to light Ear-nose-throat: Oropharynx clear, dentition fair Lymphatic: No cervical, supraclavicular or axillary adenopathy Lungs no rales or rhonchi, good excursion bilaterally Heart regular rate and rhythm, no murmur appreciated Abd soft, nontender, positive bowel sounds, no liver or spleen tip palpated on exam, no fluid wave  MSK no focal spinal tenderness, no joint edema Neuro: non-focal, well-oriented, appropriate affect Breasts: No changes.   Lab Results  Component Value Date   WBC 11.8 (H) 08/20/2018   HGB 11.0 (L) 08/20/2018   HCT 35.1 (L) 08/20/2018   MCV 97.5 08/20/2018   PLT 289 08/20/2018   Lab Results  Component Value Date   FERRITIN 146 05/22/2017   IRON 57 05/22/2017   TIBC 330 05/22/2017   UIBC 273 05/22/2017   IRONPCTSAT 17 (L) 05/22/2017   Lab Results  Component Value Date   RETICCTPCT 1.8 08/20/2018   RBC 3.60 (L) 08/20/2018   RBC 3.60 (L) 08/20/2018   No results found for: Nils Pyle [facility] Lab Results  Component Value Date   IGGSERUM 789 11/15/2016   IGA 150 11/15/2016   IGMSERUM 86 11/15/2016   No results found for: Odetta Pink, SPEI   Chemistry      Component Value Date/Time   NA 144 04/17/2017  1150   NA 141 04/18/2016 1256   K 4.6 04/17/2017 1150   K 4.4 04/18/2016 1256   CL 109 (H) 04/17/2017 1150   CO2 23 04/17/2017 1150   CO2 18 (L) 04/18/2016 1256   BUN 28 (H) 04/17/2017 1150   BUN 27.3 (H) 04/18/2016 1256   CREATININE 1.3 (H) 04/17/2017 1150   CREATININE 1.4 (H) 04/18/2016 1256      Component Value Date/Time   CALCIUM 9.6 04/17/2017 1150   CALCIUM 9.4 04/18/2016 1256   ALKPHOS 62 04/17/2017 1150   ALKPHOS 68 04/18/2016 1256   AST 24 04/17/2017 1150   AST 14 04/18/2016 1256   ALT 20 04/17/2017 1150   ALT 23  04/18/2016 1256   BILITOT 0.40 04/17/2017 1150   BILITOT <0.22 04/18/2016 1256       Impression and Plan: Ms. Usery is a very pleasant 63 yo caucasian female with history of stage I ductal carcinoma of the left breast over 15 years out. She continues to do well and so far there has been no evidence of recurrence.  She also has history of iron deficiency anemia and is symptomatic with fatigue and SOB with exertion.  We will see what her iron studies show and bring her back in for infusion if needed.  We will go ahead and plan to see her back in another 4 months for follow-up.  She will contact our office with any questions or concerns. We can certainly see her sooner if need be.   Laverna Peace, NP 2/17/202011:40 AM

## 2018-08-21 ENCOUNTER — Ambulatory Visit (INDEPENDENT_AMBULATORY_CARE_PROVIDER_SITE_OTHER): Payer: Federal, State, Local not specified - PPO

## 2018-08-21 DIAGNOSIS — I480 Paroxysmal atrial fibrillation: Secondary | ICD-10-CM

## 2018-08-21 DIAGNOSIS — E782 Mixed hyperlipidemia: Secondary | ICD-10-CM | POA: Diagnosis not present

## 2018-08-21 DIAGNOSIS — I10 Essential (primary) hypertension: Secondary | ICD-10-CM | POA: Diagnosis not present

## 2018-08-21 LAB — ERYTHROPOIETIN: Erythropoietin: 10.4 m[IU]/mL (ref 2.6–18.5)

## 2018-08-21 NOTE — Progress Notes (Signed)
Complete echocardiogram has been performed.  Jimmy Lorrene Graef RDCS, RVT 

## 2018-08-22 ENCOUNTER — Telehealth: Payer: Self-pay | Admitting: Emergency Medicine

## 2018-08-22 DIAGNOSIS — I10 Essential (primary) hypertension: Secondary | ICD-10-CM

## 2018-08-22 LAB — IRON AND TIBC
Iron: 42 ug/dL (ref 41–142)
Saturation Ratios: 11 % — ABNORMAL LOW (ref 21–57)
TIBC: 374 ug/dL (ref 236–444)
UIBC: 332 ug/dL (ref 120–384)

## 2018-08-22 LAB — FERRITIN: Ferritin: 41 ng/mL (ref 11–307)

## 2018-08-22 MED ORDER — SACUBITRIL-VALSARTAN 24-26 MG PO TABS: 1.0000 | ORAL_TABLET | Freq: Two times a day (BID) | ORAL | 3 refills | Status: DC

## 2018-08-22 NOTE — Telephone Encounter (Addendum)
Patient informed of echocardiogram results and advised to stop losartan for 3 days and start entresto 24/26 mg twice daily on Saturday 08/25/2018. Also advised patient to have labs drawn in 1 week. She verbally understands

## 2018-08-23 ENCOUNTER — Telehealth: Payer: Self-pay | Admitting: Hematology & Oncology

## 2018-08-23 NOTE — Telephone Encounter (Signed)
Appointment scheduled for IRON and patient notified per 2/20 sch msg

## 2018-08-29 ENCOUNTER — Other Ambulatory Visit: Payer: Self-pay

## 2018-08-29 ENCOUNTER — Inpatient Hospital Stay: Payer: Federal, State, Local not specified - PPO

## 2018-08-29 VITALS — BP 103/64 | HR 88 | Temp 98.6°F | Resp 20

## 2018-08-29 DIAGNOSIS — D509 Iron deficiency anemia, unspecified: Secondary | ICD-10-CM | POA: Diagnosis not present

## 2018-08-29 DIAGNOSIS — D508 Other iron deficiency anemias: Secondary | ICD-10-CM

## 2018-08-29 MED ORDER — SODIUM CHLORIDE 0.9 % IV SOLN
510.0000 mg | Freq: Once | INTRAVENOUS | Status: AC
  Administered 2018-08-29: 510 mg via INTRAVENOUS
  Filled 2018-08-29: qty 17

## 2018-08-29 MED ORDER — SODIUM CHLORIDE 0.9 % IV SOLN
Freq: Once | INTRAVENOUS | Status: AC
  Administered 2018-08-29: 14:00:00 via INTRAVENOUS
  Filled 2018-08-29: qty 250

## 2018-08-29 NOTE — Patient Instructions (Signed)

## 2018-09-01 LAB — BASIC METABOLIC PANEL
BUN/Creatinine Ratio: 16 (ref 12–28)
BUN: 19 mg/dL (ref 8–27)
CO2: 17 mmol/L — ABNORMAL LOW (ref 20–29)
Calcium: 10.3 mg/dL (ref 8.7–10.3)
Chloride: 106 mmol/L (ref 96–106)
Creatinine, Ser: 1.21 mg/dL — ABNORMAL HIGH (ref 0.57–1.00)
GFR calc Af Amer: 55 mL/min/{1.73_m2} — ABNORMAL LOW (ref >59)
GFR calc non Af Amer: 48 mL/min/{1.73_m2} — ABNORMAL LOW (ref >59)
Glucose: 139 mg/dL — ABNORMAL HIGH (ref 65–99)
POTASSIUM: 5.2 mmol/L (ref 3.5–5.2)
Sodium: 140 mmol/L (ref 134–144)

## 2018-09-04 ENCOUNTER — Telehealth: Payer: Self-pay | Admitting: Cardiology

## 2018-09-04 NOTE — Telephone Encounter (Signed)
Please call Wyatt Portela from Cover My Meds regarding patients Entresto. The reference number is [id number], and the phone is [phone].

## 2018-09-06 NOTE — Telephone Encounter (Signed)
Called cover my meds. They were calling to follow up to inform was that entresto was denied for her and appeal paperwork has been sent. Confirmed with Estill Bamberg, CMA that they have this in [place]. She will have patient sign and complete the rest of the appeal paperwork with Dr. Agustin Cree and sent it in.

## 2018-09-06 NOTE — Telephone Encounter (Signed)
Tried to contact patient to have her come by the office to sign paper work to submit appeal. No answer, voicemail full. Will try to contact the patient again.

## 2018-09-07 ENCOUNTER — Telehealth: Payer: Self-pay

## 2018-09-07 NOTE — Telephone Encounter (Signed)
Reached out to patient today. She will come by the office to sign papers for her appeal and it will be faxed.

## 2018-09-07 NOTE — Telephone Encounter (Signed)
The patient has been notified of the result and verbalized understanding.  All questions  were answered.

## 2018-09-22 ENCOUNTER — Inpatient Hospital Stay
Admission: AD | Admit: 2018-09-22 | Payer: Self-pay | Source: Other Acute Inpatient Hospital | Admitting: Pulmonary Disease

## 2018-09-22 ENCOUNTER — Encounter (HOSPITAL_COMMUNITY): Payer: Self-pay

## 2018-09-22 DIAGNOSIS — R7881 Bacteremia: Secondary | ICD-10-CM | POA: Diagnosis not present

## 2018-09-22 DIAGNOSIS — A419 Sepsis, unspecified organism: Secondary | ICD-10-CM | POA: Diagnosis not present

## 2018-09-22 DIAGNOSIS — R1031 Right lower quadrant pain: Secondary | ICD-10-CM

## 2018-09-22 DIAGNOSIS — I959 Hypotension, unspecified: Secondary | ICD-10-CM

## 2018-09-22 DIAGNOSIS — E1169 Type 2 diabetes mellitus with other specified complication: Secondary | ICD-10-CM

## 2018-09-22 DIAGNOSIS — N183 Chronic kidney disease, stage 3 (moderate): Secondary | ICD-10-CM

## 2018-09-22 DIAGNOSIS — N1330 Unspecified hydronephrosis: Secondary | ICD-10-CM

## 2018-09-22 DIAGNOSIS — R509 Fever, unspecified: Secondary | ICD-10-CM

## 2018-09-23 ENCOUNTER — Inpatient Hospital Stay (HOSPITAL_COMMUNITY): Payer: Medicare Other

## 2018-09-23 ENCOUNTER — Inpatient Hospital Stay (HOSPITAL_COMMUNITY)
Admission: AD | Admit: 2018-09-23 | Discharge: 2018-10-04 | DRG: 871 | Disposition: A | Discharge status: Home Health | Payer: Medicare Other | Source: Other Acute Inpatient Hospital | Attending: Internal Medicine | Admitting: Internal Medicine

## 2018-09-23 ENCOUNTER — Other Ambulatory Visit: Payer: Self-pay

## 2018-09-23 ENCOUNTER — Encounter (HOSPITAL_COMMUNITY): Payer: Self-pay

## 2018-09-23 DIAGNOSIS — E874 Mixed disorder of acid-base balance: Secondary | ICD-10-CM | POA: Diagnosis present

## 2018-09-23 DIAGNOSIS — J9601 Acute respiratory failure with hypoxia: Secondary | ICD-10-CM | POA: Diagnosis not present

## 2018-09-23 DIAGNOSIS — K7200 Acute and subacute hepatic failure without coma: Secondary | ICD-10-CM | POA: Diagnosis present

## 2018-09-23 DIAGNOSIS — N170 Acute kidney failure with tubular necrosis: Secondary | ICD-10-CM

## 2018-09-23 DIAGNOSIS — K219 Gastro-esophageal reflux disease without esophagitis: Secondary | ICD-10-CM | POA: Diagnosis present

## 2018-09-23 DIAGNOSIS — Z888 Allergy status to other drugs, medicaments and biological substances status: Secondary | ICD-10-CM | POA: Diagnosis not present

## 2018-09-23 DIAGNOSIS — E860 Dehydration: Secondary | ICD-10-CM | POA: Diagnosis not present

## 2018-09-23 DIAGNOSIS — K760 Fatty (change of) liver, not elsewhere classified: Secondary | ICD-10-CM | POA: Diagnosis present

## 2018-09-23 DIAGNOSIS — I480 Paroxysmal atrial fibrillation: Secondary | ICD-10-CM | POA: Diagnosis not present

## 2018-09-23 DIAGNOSIS — N179 Acute kidney failure, unspecified: Secondary | ICD-10-CM | POA: Diagnosis present

## 2018-09-23 DIAGNOSIS — Y92230 Patient room in hospital as the place of occurrence of the external cause: Secondary | ICD-10-CM | POA: Diagnosis not present

## 2018-09-23 DIAGNOSIS — R1310 Dysphagia, unspecified: Secondary | ICD-10-CM | POA: Diagnosis not present

## 2018-09-23 DIAGNOSIS — Z885 Allergy status to narcotic agent status: Secondary | ICD-10-CM | POA: Diagnosis not present

## 2018-09-23 DIAGNOSIS — J189 Pneumonia, unspecified organism: Secondary | ICD-10-CM | POA: Diagnosis not present

## 2018-09-23 DIAGNOSIS — R7881 Bacteremia: Secondary | ICD-10-CM

## 2018-09-23 DIAGNOSIS — Z887 Allergy status to serum and vaccine status: Secondary | ICD-10-CM | POA: Diagnosis not present

## 2018-09-23 DIAGNOSIS — R0602 Shortness of breath: Secondary | ICD-10-CM

## 2018-09-23 DIAGNOSIS — N136 Pyonephrosis: Secondary | ICD-10-CM | POA: Diagnosis present

## 2018-09-23 DIAGNOSIS — L602 Onychogryphosis: Secondary | ICD-10-CM

## 2018-09-23 DIAGNOSIS — Z79899 Other long term (current) drug therapy: Secondary | ICD-10-CM

## 2018-09-23 DIAGNOSIS — D6959 Other secondary thrombocytopenia: Secondary | ICD-10-CM | POA: Diagnosis present

## 2018-09-23 DIAGNOSIS — R0682 Tachypnea, not elsewhere classified: Secondary | ICD-10-CM

## 2018-09-23 DIAGNOSIS — I482 Chronic atrial fibrillation, unspecified: Secondary | ICD-10-CM | POA: Diagnosis present

## 2018-09-23 DIAGNOSIS — I340 Nonrheumatic mitral (valve) insufficiency: Secondary | ICD-10-CM

## 2018-09-23 DIAGNOSIS — E669 Obesity, unspecified: Secondary | ICD-10-CM | POA: Diagnosis present

## 2018-09-23 DIAGNOSIS — Z6834 Body mass index (BMI) 34.0-34.9, adult: Secondary | ICD-10-CM | POA: Diagnosis not present

## 2018-09-23 DIAGNOSIS — B9620 Unspecified Escherichia coli [E. coli] as the cause of diseases classified elsewhere: Secondary | ICD-10-CM | POA: Diagnosis not present

## 2018-09-23 DIAGNOSIS — B999 Unspecified infectious disease: Secondary | ICD-10-CM

## 2018-09-23 DIAGNOSIS — Z91013 Allergy to seafood: Secondary | ICD-10-CM | POA: Diagnosis not present

## 2018-09-23 DIAGNOSIS — Z87440 Personal history of urinary (tract) infections: Secondary | ICD-10-CM

## 2018-09-23 DIAGNOSIS — R6521 Severe sepsis with septic shock: Secondary | ICD-10-CM | POA: Diagnosis present

## 2018-09-23 DIAGNOSIS — I361 Nonrheumatic tricuspid (valve) insufficiency: Secondary | ICD-10-CM | POA: Diagnosis not present

## 2018-09-23 DIAGNOSIS — Z87442 Personal history of urinary calculi: Secondary | ICD-10-CM

## 2018-09-23 DIAGNOSIS — J42 Unspecified chronic bronchitis: Secondary | ICD-10-CM | POA: Diagnosis present

## 2018-09-23 DIAGNOSIS — A419 Sepsis, unspecified organism: Secondary | ICD-10-CM

## 2018-09-23 DIAGNOSIS — J45909 Unspecified asthma, uncomplicated: Secondary | ICD-10-CM | POA: Diagnosis present

## 2018-09-23 DIAGNOSIS — G8929 Other chronic pain: Secondary | ICD-10-CM | POA: Diagnosis present

## 2018-09-23 DIAGNOSIS — E1169 Type 2 diabetes mellitus with other specified complication: Secondary | ICD-10-CM | POA: Diagnosis not present

## 2018-09-23 DIAGNOSIS — Z981 Arthrodesis status: Secondary | ICD-10-CM

## 2018-09-23 DIAGNOSIS — E119 Type 2 diabetes mellitus without complications: Secondary | ICD-10-CM | POA: Diagnosis not present

## 2018-09-23 DIAGNOSIS — E7220 Disorder of urea cycle metabolism, unspecified: Secondary | ICD-10-CM | POA: Diagnosis not present

## 2018-09-23 DIAGNOSIS — Z853 Personal history of malignant neoplasm of breast: Secondary | ICD-10-CM

## 2018-09-23 DIAGNOSIS — G92 Toxic encephalopathy: Secondary | ICD-10-CM | POA: Diagnosis not present

## 2018-09-23 DIAGNOSIS — A0472 Enterocolitis due to Clostridium difficile, not specified as recurrent: Secondary | ICD-10-CM | POA: Diagnosis not present

## 2018-09-23 DIAGNOSIS — I081 Rheumatic disorders of both mitral and tricuspid valves: Secondary | ICD-10-CM | POA: Diagnosis present

## 2018-09-23 DIAGNOSIS — I5022 Chronic systolic (congestive) heart failure: Secondary | ICD-10-CM | POA: Diagnosis present

## 2018-09-23 DIAGNOSIS — Z803 Family history of malignant neoplasm of breast: Secondary | ICD-10-CM

## 2018-09-23 DIAGNOSIS — M545 Low back pain: Secondary | ICD-10-CM | POA: Diagnosis present

## 2018-09-23 DIAGNOSIS — N390 Urinary tract infection, site not specified: Secondary | ICD-10-CM | POA: Diagnosis not present

## 2018-09-23 DIAGNOSIS — A4150 Gram-negative sepsis, unspecified: Secondary | ICD-10-CM | POA: Diagnosis present

## 2018-09-23 DIAGNOSIS — Z825 Family history of asthma and other chronic lower respiratory diseases: Secondary | ICD-10-CM

## 2018-09-23 DIAGNOSIS — T424X5A Adverse effect of benzodiazepines, initial encounter: Secondary | ICD-10-CM | POA: Diagnosis not present

## 2018-09-23 DIAGNOSIS — Z03818 Encounter for observation for suspected exposure to other biological agents ruled out: Secondary | ICD-10-CM | POA: Diagnosis not present

## 2018-09-23 DIAGNOSIS — M1990 Unspecified osteoarthritis, unspecified site: Secondary | ICD-10-CM | POA: Diagnosis present

## 2018-09-23 DIAGNOSIS — R410 Disorientation, unspecified: Secondary | ICD-10-CM | POA: Diagnosis not present

## 2018-09-23 DIAGNOSIS — N1330 Unspecified hydronephrosis: Secondary | ICD-10-CM | POA: Diagnosis not present

## 2018-09-23 DIAGNOSIS — E876 Hypokalemia: Secondary | ICD-10-CM | POA: Diagnosis not present

## 2018-09-23 DIAGNOSIS — J9690 Respiratory failure, unspecified, unspecified whether with hypoxia or hypercapnia: Secondary | ICD-10-CM

## 2018-09-23 DIAGNOSIS — Z7901 Long term (current) use of anticoagulants: Secondary | ICD-10-CM

## 2018-09-23 DIAGNOSIS — Z90722 Acquired absence of ovaries, bilateral: Secondary | ICD-10-CM

## 2018-09-23 DIAGNOSIS — Z800 Family history of malignant neoplasm of digestive organs: Secondary | ICD-10-CM

## 2018-09-23 DIAGNOSIS — R400 Somnolence: Secondary | ICD-10-CM | POA: Diagnosis not present

## 2018-09-23 DIAGNOSIS — I10 Essential (primary) hypertension: Secondary | ICD-10-CM | POA: Diagnosis not present

## 2018-09-23 DIAGNOSIS — E7800 Pure hypercholesterolemia, unspecified: Secondary | ICD-10-CM | POA: Diagnosis present

## 2018-09-23 DIAGNOSIS — R4182 Altered mental status, unspecified: Secondary | ICD-10-CM

## 2018-09-23 DIAGNOSIS — Z8051 Family history of malignant neoplasm of kidney: Secondary | ICD-10-CM

## 2018-09-23 DIAGNOSIS — Z8249 Family history of ischemic heart disease and other diseases of the circulatory system: Secondary | ICD-10-CM

## 2018-09-23 DIAGNOSIS — Z7984 Long term (current) use of oral hypoglycemic drugs: Secondary | ICD-10-CM

## 2018-09-23 HISTORY — DX: Sepsis, unspecified organism: A41.9

## 2018-09-23 LAB — CBC WITH DIFFERENTIAL/PLATELET
Abs Immature Granulocytes: 3.66 10*3/uL — ABNORMAL HIGH (ref 0.00–0.07)
BASOS PCT: 0 %
Basophils Absolute: 0.1 10*3/uL (ref 0.0–0.1)
EOS ABS: 0.1 10*3/uL (ref 0.0–0.5)
Eosinophils Relative: 0 %
HCT: 25.8 % — ABNORMAL LOW (ref 36.0–46.0)
HEMOGLOBIN: 8 g/dL — AB (ref 12.0–15.0)
Immature Granulocytes: 16 %
Lymphocytes Relative: 4 %
Lymphs Abs: 0.8 10*3/uL (ref 0.7–4.0)
MCH: 29.4 pg (ref 26.0–34.0)
MCHC: 31 g/dL (ref 30.0–36.0)
MCV: 94.9 fL (ref 80.0–100.0)
Monocytes Absolute: 0.6 10*3/uL (ref 0.1–1.0)
Monocytes Relative: 3 %
Neutro Abs: 18 10*3/uL — ABNORMAL HIGH (ref 1.7–7.7)
Neutrophils Relative %: 77 %
Platelets: 72 10*3/uL — ABNORMAL LOW (ref 150–400)
RBC: 2.72 MIL/uL — ABNORMAL LOW (ref 3.87–5.11)
RDW: 16.1 % — ABNORMAL HIGH (ref 11.5–15.5)
WBC Morphology: INCREASED
WBC: 23.2 10*3/uL — ABNORMAL HIGH (ref 4.0–10.5)
nRBC: 0 % (ref 0.0–0.2)

## 2018-09-23 LAB — BLOOD GAS, ARTERIAL
Acid-base deficit: 10.9 mmol/L — ABNORMAL HIGH (ref 0.0–2.0)
Bicarbonate: 13.7 mmol/L — ABNORMAL LOW (ref 20.0–28.0)
O2 Content: 2 L/min
O2 SAT: 96 %
Patient temperature: 98.9
pCO2 arterial: 26.4 mmHg — ABNORMAL LOW (ref 32.0–48.0)
pH, Arterial: 7.337 — ABNORMAL LOW (ref 7.350–7.450)
pO2, Arterial: 84 mmHg (ref 83.0–108.0)

## 2018-09-23 LAB — GLUCOSE, CAPILLARY
Glucose-Capillary: 155 mg/dL — ABNORMAL HIGH (ref 70–99)
Glucose-Capillary: 157 mg/dL — ABNORMAL HIGH (ref 70–99)
Glucose-Capillary: 116 mg/dL — ABNORMAL HIGH (ref 70–99)
Glucose-Capillary: 106 mg/dL — ABNORMAL HIGH (ref 70–99)
Glucose-Capillary: 122 mg/dL — ABNORMAL HIGH (ref 70–99)
Glucose-Capillary: 130 mg/dL — ABNORMAL HIGH (ref 70–99)
Glucose-Capillary: 148 mg/dL — ABNORMAL HIGH (ref 70–99)

## 2018-09-23 LAB — PHOSPHORUS: Phosphorus: 5.3 mg/dL — ABNORMAL HIGH (ref 2.5–4.6)

## 2018-09-23 LAB — PROTIME-INR
INR: 3.2 — ABNORMAL HIGH (ref 0.8–1.2)
INR: 2.1 — ABNORMAL HIGH (ref 0.8–1.2)
Prothrombin Time: 32.3 seconds — ABNORMAL HIGH (ref 11.4–15.2)
Prothrombin Time: 23.3 seconds — ABNORMAL HIGH (ref 11.4–15.2)

## 2018-09-23 LAB — URINALYSIS, COMPLETE (UACMP) WITH MICROSCOPIC
Bilirubin Urine: NEGATIVE
Glucose, UA: NEGATIVE mg/dL
Ketones, ur: NEGATIVE mg/dL
Nitrite: NEGATIVE
Protein, ur: 100 mg/dL — AB
Specific Gravity, Urine: 1.028 (ref 1.005–1.030)
WBC, UA: >50 WBC/hpf — ABNORMAL HIGH (ref 0–5)
pH: 5 (ref 5.0–8.0)

## 2018-09-23 LAB — COMPREHENSIVE METABOLIC PANEL
ALK PHOS: 35 U/L — AB (ref 38–126)
ALT: 39 U/L (ref 0–44)
AST: 56 U/L — ABNORMAL HIGH (ref 15–41)
Albumin: 2.5 g/dL — ABNORMAL LOW (ref 3.5–5.0)
Anion gap: 17 — ABNORMAL HIGH (ref 5–15)
BILIRUBIN TOTAL: 1 mg/dL (ref 0.3–1.2)
BUN: 38 mg/dL — ABNORMAL HIGH (ref 8–23)
CALCIUM: 6.7 mg/dL — AB (ref 8.9–10.3)
CO2: 14 mmol/L — ABNORMAL LOW (ref 22–32)
Chloride: 105 mmol/L (ref 98–111)
Creatinine, Ser: 3.26 mg/dL — ABNORMAL HIGH (ref 0.44–1.00)
GFR calc Af Amer: 17 mL/min — ABNORMAL LOW (ref >60)
GFR, EST NON AFRICAN AMERICAN: 14 mL/min — AB (ref >60)
Glucose, Bld: 167 mg/dL — ABNORMAL HIGH (ref 70–99)
Potassium: 4.2 mmol/L (ref 3.5–5.1)
Sodium: 136 mmol/L (ref 135–145)
Total Protein: 5.7 g/dL — ABNORMAL LOW (ref 6.5–8.1)

## 2018-09-23 LAB — ECHOCARDIOGRAM LIMITED
Height: 59 in
WEIGHTICAEL: 2553.81 [oz_av]

## 2018-09-23 LAB — MRSA PCR SCREENING: MRSA BY PCR: NEGATIVE

## 2018-09-23 LAB — LACTIC ACID, PLASMA
Lactic Acid, Venous: 6.1 mmol/L (ref 0.5–1.9)
Lactic Acid, Venous: 6.5 mmol/L (ref 0.5–1.9)

## 2018-09-23 LAB — BRAIN NATRIURETIC PEPTIDE: B Natriuretic Peptide: >4500 pg/mL — ABNORMAL HIGH (ref 0.0–100.0)

## 2018-09-23 LAB — HIV ANTIBODY (ROUTINE TESTING W REFLEX): HIV Screen 4th Generation wRfx: NONREACTIVE

## 2018-09-23 LAB — MAGNESIUM: MAGNESIUM: 0.8 mg/dL — AB (ref 1.7–2.4)

## 2018-09-23 MED ORDER — VANCOMYCIN 50 MG/ML ORAL SOLUTION
125.0000 mg | Freq: Four times a day (QID) | ORAL | Status: DC
  Administered 2018-09-23 – 2018-09-25 (×10): 125 mg via ORAL
  Filled 2018-09-23 (×12): qty 2.5

## 2018-09-23 MED ORDER — MAGNESIUM SULFATE 2 GM/50ML IV SOLN
2.0000 g | Freq: Once | INTRAVENOUS | Status: AC
  Administered 2018-09-23: 2 g via INTRAVENOUS
  Filled 2018-09-23: qty 50

## 2018-09-23 MED ORDER — MIDAZOLAM HCL 2 MG/2ML IJ SOLN
INTRAMUSCULAR | Status: AC
  Filled 2018-09-23: qty 6

## 2018-09-23 MED ORDER — PIPERACILLIN-TAZOBACTAM IN DEX 2-0.25 GM/50ML IV SOLN
2.2500 g | Freq: Four times a day (QID) | INTRAVENOUS | Status: DC
  Administered 2018-09-23 – 2018-09-24 (×5): 2.25 g via INTRAVENOUS
  Filled 2018-09-23 (×6): qty 50

## 2018-09-23 MED ORDER — FLECAINIDE ACETATE 50 MG PO TABS
50.0000 mg | ORAL_TABLET | Freq: Two times a day (BID) | ORAL | Status: DC
  Administered 2018-09-23 – 2018-09-26 (×7): 50 mg via ORAL
  Filled 2018-09-23 (×7): qty 1

## 2018-09-23 MED ORDER — INSULIN ASPART 100 UNIT/ML ~~LOC~~ SOLN
0.0000 [IU] | Freq: Three times a day (TID) | SUBCUTANEOUS | Status: DC
  Administered 2018-09-23 – 2018-09-24 (×2): 2 [IU] via SUBCUTANEOUS
  Administered 2018-09-24: 3 [IU] via SUBCUTANEOUS
  Administered 2018-09-24 – 2018-09-25 (×3): 2 [IU] via SUBCUTANEOUS
  Administered 2018-09-26 (×2): 3 [IU] via SUBCUTANEOUS

## 2018-09-23 MED ORDER — CALCIUM GLUCONATE-NACL 1-0.675 GM/50ML-% IV SOLN
1.0000 g | Freq: Once | INTRAVENOUS | Status: AC
  Administered 2018-09-23: 1000 mg via INTRAVENOUS
  Filled 2018-09-23: qty 50

## 2018-09-23 MED ORDER — PIPERACILLIN-TAZOBACTAM 3.375 G IVPB 30 MIN: 3.3750 g | Freq: Three times a day (TID) | INTRAVENOUS | Status: DC

## 2018-09-23 MED ORDER — SERTRALINE HCL 50 MG PO TABS
50.0000 mg | ORAL_TABLET | Freq: Every day | ORAL | Status: DC
  Administered 2018-09-23 – 2018-10-04 (×10): 50 mg via ORAL
  Filled 2018-09-23 (×11): qty 1

## 2018-09-23 MED ORDER — SODIUM CHLORIDE 0.9 % IV SOLN
250.0000 mL | INTRAVENOUS | Status: DC
  Administered 2018-09-23: 250 mL via INTRAVENOUS

## 2018-09-23 MED ORDER — SODIUM BICARBONATE 8.4 % IV SOLN
INTRAVENOUS | Status: DC
  Filled 2018-09-23: qty 150

## 2018-09-23 MED ORDER — FENTANYL CITRATE (PF) 100 MCG/2ML IJ SOLN
INTRAMUSCULAR | Status: AC
  Filled 2018-09-23: qty 4

## 2018-09-23 MED ORDER — ATORVASTATIN CALCIUM 10 MG PO TABS
10.0000 mg | ORAL_TABLET | Freq: Every day | ORAL | Status: DC
  Administered 2018-09-23 – 2018-09-26 (×3): 10 mg via ORAL
  Filled 2018-09-23 (×4): qty 1

## 2018-09-23 MED ORDER — STERILE WATER FOR INJECTION IV SOLN
INTRAVENOUS | Status: AC
  Administered 2018-09-23: 03:00:00 via INTRAVENOUS
  Filled 2018-09-23: qty 850

## 2018-09-23 MED ORDER — ALPRAZOLAM 0.25 MG PO TABS
0.2500 mg | ORAL_TABLET | Freq: Three times a day (TID) | ORAL | Status: DC | PRN
  Administered 2018-09-23 – 2018-09-24 (×3): 0.25 mg via ORAL
  Filled 2018-09-23 (×4): qty 1

## 2018-09-23 MED ORDER — PANTOPRAZOLE SODIUM 40 MG PO TBEC
40.0000 mg | DELAYED_RELEASE_TABLET | Freq: Every day | ORAL | Status: DC
  Administered 2018-09-23 – 2018-09-25 (×3): 40 mg via ORAL
  Filled 2018-09-23 (×3): qty 1

## 2018-09-23 MED ORDER — ENOXAPARIN SODIUM 40 MG/0.4ML ~~LOC~~ SOLN: 40.0000 mg | SUBCUTANEOUS | Status: DC

## 2018-09-23 MED ORDER — NOREPINEPHRINE 4 MG/250ML-% IV SOLN
5.0000 ug/min | INTRAVENOUS | Status: DC
  Administered 2018-09-23: 3 ug/min via INTRAVENOUS
  Administered 2018-09-23: 20 ug/min via INTRAVENOUS
  Administered 2018-09-23: 40 ug/min via INTRAVENOUS
  Administered 2018-09-25: 11 ug/min via INTRAVENOUS
  Filled 2018-09-23 (×5): qty 250

## 2018-09-23 MED ORDER — PIPERACILLIN-TAZOBACTAM 3.375 G IVPB
3.3750 g | Freq: Three times a day (TID) | INTRAVENOUS | Status: DC
  Administered 2018-09-23: 3.375 g via INTRAVENOUS
  Filled 2018-09-23 (×2): qty 50

## 2018-09-23 MED ORDER — ONDANSETRON HCL 4 MG/2ML IJ SOLN
4.0000 mg | Freq: Four times a day (QID) | INTRAMUSCULAR | Status: DC | PRN
  Administered 2018-09-23 – 2018-09-24 (×4): 4 mg via INTRAVENOUS
  Filled 2018-09-23 (×5): qty 2

## 2018-09-23 MED ORDER — SODIUM CHLORIDE 0.9 % IV BOLUS
500.0000 mL | Freq: Once | INTRAVENOUS | Status: AC
  Administered 2018-09-23: 500 mL via INTRAVENOUS

## 2018-09-23 MED ORDER — SODIUM CHLORIDE 0.9 % IV SOLN
INTRAVENOUS | Status: DC | PRN
  Administered 2018-09-24: 01:00:00 via INTRA_ARTERIAL

## 2018-09-23 MED ORDER — HYDROCORTISONE NA SUCCINATE PF 100 MG IJ SOLR
50.0000 mg | Freq: Four times a day (QID) | INTRAMUSCULAR | Status: DC
  Administered 2018-09-23 – 2018-09-25 (×11): 50 mg via INTRAVENOUS
  Filled 2018-09-23 (×11): qty 2

## 2018-09-23 MED ORDER — VASOPRESSIN 20 UNIT/ML IV SOLN
0.0300 [IU]/min | INTRAVENOUS | Status: DC
  Administered 2018-09-23: 0.03 [IU]/min via INTRAVENOUS
  Filled 2018-09-23: qty 2

## 2018-09-23 MED ORDER — ORAL CARE MOUTH RINSE
15.0000 mL | Freq: Two times a day (BID) | OROMUCOSAL | Status: DC
  Administered 2018-09-23 – 2018-09-25 (×4): 15 mL via OROMUCOSAL

## 2018-09-23 MED ORDER — ACETAMINOPHEN 325 MG PO TABS
650.0000 mg | ORAL_TABLET | ORAL | Status: DC | PRN
  Administered 2018-09-25: 650 mg via ORAL
  Filled 2018-09-23: qty 2

## 2018-09-23 MED ORDER — STERILE WATER FOR INJECTION IV SOLN
INTRAVENOUS | Status: AC
  Administered 2018-09-23: 14:00:00 via INTRAVENOUS
  Filled 2018-09-23: qty 850

## 2018-09-23 MED ORDER — PROTHROMBIN COMPLEX CONC HUMAN 1000 UNITS IV KIT
3363.0000 [IU] | PACK | Status: AC
  Administered 2018-09-23: 3363 [IU] via INTRAVENOUS
  Filled 2018-09-23: qty 3363

## 2018-09-23 MED ORDER — TRAMADOL HCL 50 MG PO TABS
50.0000 mg | ORAL_TABLET | Freq: Two times a day (BID) | ORAL | Status: DC | PRN
  Administered 2018-09-23 – 2018-09-24 (×2): 50 mg via ORAL
  Filled 2018-09-23 (×2): qty 1

## 2018-09-23 NOTE — Progress Notes (Signed)
eLink Physician-Brief Progress Note Patient Name: [name] DOB: 1955-07-20 MRN: [id number]   Date of Service  09/23/2018  HPI/Events of Note  Diarrhea - Large, liquid stools. Known C. Difficile Colitis. Rectal patch not working. Request for Flexiseal.   eICU Interventions  Will order: 1. Place Flexiseal.      Intervention Category Major Interventions: Other:  Lysle Dingwall 09/23/2018, 10:20 PM

## 2018-09-23 NOTE — Progress Notes (Signed)
[facility] Progress Note Patient Name: [name] DOB: 03/18/1956 MRN: [id number]   Date of Service  09/23/2018  HPI/Events of Note  Patient c/o back pain and states she takes Tramadol at home.   eICU Interventions  Will order: 1. Tramadol 50 mg PO Q 12 hours PRN pain.      Intervention Category Major Interventions: Other:  Lysle Dingwall 09/23/2018, 4:48 AM

## 2018-09-23 NOTE — H&P (Signed)
NAME:  [name], MRN:  [id number], DOB:  April 27, 1956, LOS: 0 ADMISSION DATE:  09/23/2018, CONSULTATION DATE:  09/23/2018 REFERRING MD:  None , CHIEF COMPLAINT:  Septic shock   History of present illness   [name] is a 63 year old female with history of type 2 diabetes mellitus, heart failure with reduced ejection fraction, atrial fibrillation on anticoagulation with Eliquis who presents as a transfer from [place] for higher level of care for septic shock.  Patient states that she was in her usual state of health until a few days ago.  She developed right-sided abdominal pain.  The pain was mostly on her right side radiating to her groin.  It was sharp and persistent.  She reports associated nausea with some vomiting and diarrhea.  She presented to [facility] on 320.  At the outside hospital she was found to have a low-grade fever of 100.5.  She had a CT abdomen and pelvis done which showed a right kidney stone with hydronephrosis.  While there she also developed hypotension.  She was initially given 3 L of fluids without significant improvement in her blood pressure.  She was started on pressors that have been titrated upwards since initiation.  Blood cultures were obtained which grew gram-negative rods.  She was put on Zosyn.  Her stool was also checked for C. difficile and it was found to be positive and she was started on oral vancomycin.  Tonight she was started on a bicarb drip for acidosis.   While there there was also concern for coronavirus infection.  She had a flu swab done that was negative.  The concern was due to her fever.  And recently she was at a wedding with 20 people.    On arrival here she is awake and alert.  She is able to provide history.  She has a right IJ central line on Levophed 20 mics phenylephrine 120 and a bicarb drip.  Labs at the OSH on 3/21 WBC 14.2 Hgb 9.1 Plts 110 D dimer 4728 ABG 3/21 at 2304 hrs PH 7.23 PCO2 25 PO2 71 Na 136 K 3.9 Cl 107  CO2 13 AG 20 BUN 33 Crt 2.7  LA 5.2-->5.5 Procalcitonin >200 COVID 19 Pending  C.diff Positive by PCR Blood culture-Gram negative rods  Flu swab-Negative   CT abdomen and pelvis 3/20 1-43mm right UPJ stone with moderate right hydronephrosis and prominent right perinephrid edema 53mm non obstructing stone upper pole right kidney  Abdominal Ultrasound  Probable fatty infiltration of liver  Moderate hydronephrosis   CXR No active cardiopulmonary disease. Mild cardiomegaly  Past Medical History  -Type II DM -HFrEF -A-fib on anticoagulation  Significant Hospital Events   -Transferred to [facility] from [facility]SH  Consults:  -Interventional radiology  Procedures:  -None   Significant Diagnostic Tests:  -Other tests as above  Echo 08/2018  1. The left ventricle has decreased systolic function with an ejection fraction of 40-45%. The cavity size was normal. There is moderate to severe concentric left ventricular hypertrophy.  2. The right ventricle has normal systolic function. The cavity was normal. There is no increase in right ventricular wall thickness.  3. The tricuspid valve is normal in structure.  4. The pulmonic valve was normal in structure.  5. Right atrial pressure is estimated at 3 mmHg.  6. The aortic root, ascending aorta and aortic arch are normal in size and structure.  7. The aortic valve is tricuspid.  8. The mitral valve is normal  in structure with mild MR  Micro Data:  -Blood culture from [place]-Gram negative rods  -Stool-Positive for C.diff  Antimicrobials:  -PO vanc -Zosyn  Objective   Blood pressure 115/84, pulse (!) 124, temperature 98.9 F (37.2 C), temperature source Oral, resp. rate (!) 34, height 4\' 11"  (1.499 m), weight 72.4 kg, SpO2 95 %.       No intake or output data in the 24 hours ending 09/23/18 0154 Filed Weights   09/23/18 0116  Weight: 72.4 kg   Examination: General: Awake alert and oriented HENT: Normocephalic, normal  dentition Lungs: Clear to auscultation bilaterally  Cardiovascular: Tachycardic, In atrial fibrillation, Hypotensive on pressors  Abdomen: Soft non tender, no organomegaly Extremities: No edema, No focal deficits  Neuro: Awake, alert and oriented, no focal neurological deficits  GU: Foley in situ  Assessment & Plan:  #Septic shock #Metabolic acidosis #Gram negative rod bacteremia  Patient with septic shock due to gram negative rod bacteremia from urinary source. She has right sided kidney stones causing moderate hydronephrosis. Source control has not been achieved -Continue Zosyn -Obtain blood cultures -IR consult for nephrostomy tube drainage for source control -Echo to evaluate cardiac function -Trend lactic acid -Continue bicarb drip at 100cc/hour for 6 hours -Place arterial line for better BP monitoring -Follow cultures from OSH and deescalate antibiotics as appropriate -Stress dose steroids -Discontinue phenylephrine -Start vasopressin -Continue levophed -Titrate pressors to MAP of 65  #C.diff colitis Had diarrhea on presentation and C.diff test was positive. -Continue PO vancomycin  -Special enteric procedures  #Acute kidney Injury #Obstructive uropathy AKI is likely multifactorial partly due to obstructive uropathy from kidney stones and partly prerenal due to shock. Her baseline creatinine is 1-1.3. Creatinine is elevated to 2.7 -IVF as above -IR consulted for nephrostomy tube -Daily BMP -Avoid nephrotoxic agents -Renally dose all medications  #Atrial fibrillation  Patient with history of a-fib on carvedilol and fleicanide at home. She is also anticoagulated on eliquis. She was on a heparin gtt at the outside hospital -She is currently tachycardic to mid 120's, driven by her current acute illness -Hold carvedilol due to shock -Continue flecainide -Obtain echo to evaluate cardiac function -Hold all anticoagulation in anticipation of nephrostomy tube placement     #Chronic systolic Heart failure She is currently in shock, presumed to be septic shock -Obtain echo as above -Hold entresto, carvedilol and diuretics -Will resume as appropriate once shock resolves  #Concern for COVID 19 Patient is very low risk. Her low grade fever is due to GNR bacteremia. Test already obtained at OSH -Follow test results   Best practice:  Diet: NPO  Pain/Anxiety/Delirium protocol (if indicated): Continue home ativan VAP protocol (if indicated): N/A DVT prophylaxis: Hold pharmacological DVT prophylaxis for now as she awaits nephrostomy tube, SCDs GI prophylaxis: Continue home PPI  Glucose control: Slidiing scale to maintain euglycemia  Mobility: PT and OT once appropriate Code Status: Full code  Family Communication: No family at bedside  Disposition: admit to ICU   Labs   Pending   CBG: Recent Labs  Lab 09/23/18 0121  GLUCAP 155*    Review of Systems:   Review of Systems  Constitutional: Positive for fever. Negative for chills.  Respiratory: Negative for sputum production and shortness of breath.   Cardiovascular: Negative for chest pain, orthopnea and leg swelling.  Gastrointestinal: Positive for abdominal pain, diarrhea, nausea and vomiting.  Genitourinary: Negative for dysuria.  Musculoskeletal: Negative for myalgias.  Skin: Negative for rash.  Neurological: Negative for speech change  and headaches.    Past Medical History  She,  has a past medical history of Arthritis, Asthma, Breast cancer ([place]), CHF (congestive heart failure) ([place]), Chronic bronchitis ([place]), Chronic lower back pain, Dysrhythmia, Family history of anesthesia complication, GERD (gastroesophageal reflux disease), Heart murmur, High cholesterol, Kidney stones, Migraine, Pneumonia, PONV (postoperative nausea and vomiting), Recurrent UTI (urinary tract infection), Sepsis ([place]) (05), Tension headache, and Type II diabetes mellitus ([place]).   Surgical History    Past Surgical  History:  Procedure Laterality Date  . 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 BREAST BIOPSY Left 02/2001  . BREAST LUMPECTOMY Left 02/2001  . BREAST LUMPECTOMY Left 09/23/2013   Procedure: LEFT LUMPECTOMY WITH SPECIMEN MAMMOGRAM;  Surgeon: Adin Hector, MD;  Location: [facility];  Service: General;  Laterality: Left;  . COLONOSCOPY    . DIAGNOSTIC LAPAROSCOPY     cyst-near ovary  . LITHOTRIPSY     "2-3 times prior to 2002" (05/15/2013)  . SPINAL FUSION N/A 05/08/2013   Procedure: T9-S1 INSTRUMENTED FUSION T12 -S1 DECOMPRESSION;  Surgeon: Melina Schools, MD;  Location: [facility];  Service: Orthopedics;  Laterality: N/A;  . TUBAL LIGATION Bilateral 1985     Social History   reports that she is a non-smoker but has been exposed to tobacco smoke. She has never used smokeless tobacco. She reports that she does not drink alcohol or use drugs.   Family History   Her family history includes Breast cancer in her mother; COPD in her mother; Environmental Allergies in her son and another family member; Heart disease in her mother; Kidney cancer in her father; Liver cancer in her brother; Lupus in an other family member.   Allergies Allergies  Allergen Reactions  . Other Nausea And Vomiting and Swelling    Shrimp if eat a lot  . Cleocin [Clindamycin Hcl] Other (See Comments)    "irritated my esophagus"  . Morphine And Related Nausea And Vomiting  . Pneumococcal Vaccines Other (See Comments)    Really high fever, and flu symptoms. MD instructed not to give  . Buprenorphine Hcl Nausea And Vomiting     Home Medications  Prior to Admission medications   Medication Sig Start Date End Date Taking? Authorizing Provider  acetaminophen (TYLENOL) 500 MG tablet Take 500 mg every 6 (six) hours as needed by mouth.    [provider]  albuterol (PROVENTIL HFA;VENTOLIN HFA) 108 (90 Base) MCG/ACT  inhaler Inhale 2 puffs into the lungs as needed for wheezing or shortness of breath. 06/28/18   Fenton Foy, NP  ALPRAZolam (XANAX) 0.25 MG tablet alprazolam 0.25 mg tablet  TAKE 1 TABLET BY MOUTH 3 TIMES A DAY    [provider]  atorvastatin (LIPITOR) 10 MG tablet TAKE 1 TABLET ONCE A DAY (AT BEDTIME) 03/18/15   [provider]  carvedilol (COREG) 6.25 MG tablet Take 6.25 mg by mouth 2 (two) times daily with a meal.  04/01/13   [provider]  ELIQUIS 5 MG TABS tablet TAKE 1 TABLET BY MOUTH TWICE A DAY 08/17/18   Park Liter, MD  flecainide (TAMBOCOR) 50 MG tablet Take 50 mg by mouth 2 (two) times daily.  03/31/13   [provider]  fluticasone (FLONASE) 50 MCG/ACT nasal spray PLACE 1 SPRAY INTO BOTH NOSTRILS 2 (TWO) TIMES DAILY. 02/27/18   Tanda Rockers, MD  glipiZIDE (GLUCOTROL XL) 5 MG  24 hr tablet Take 10 mg by mouth daily with breakfast.     [provider]  loratadine (CLARITIN) 10 MG tablet Take 10 mg by mouth daily as needed for allergies.    [provider]  metFORMIN (GLUCOPHAGE) 1000 MG tablet Take 1,000 mg by mouth 2 (two) times daily with a meal.    [provider]  montelukast (SINGULAIR) 10 MG tablet TAKE 1 TABLET BY MOUTH EVERYDAY AT BEDTIME 08/06/18   Tanda Rockers, MD  omeprazole (PRILOSEC) 20 MG capsule Take 20 mg by mouth daily.  03/31/13   [provider]  sacubitril-valsartan (ENTRESTO) 24-26 MG Take 1 tablet by mouth 2 (two) times daily. 08/22/18   Park Liter, MD  sertraline (ZOLOFT) 50 MG tablet Take 50 mg by mouth daily. 10/24/16   [provider]  sitaGLIPtin (JANUVIA) 100 MG tablet Take 100 mg by mouth daily.    [provider]     Critical care time: The patient is critically ill with multiple organ systems failure and requires high complexity decision making for assessment and support, frequent evaluation and titration of therapies, application of advanced  monitoring technologies and extensive interpretation of multiple databases.   Critical Care Time devoted to patient care services described in this note is  75 Minutes. This time reflects time of care of this signee Dr. Oswald Hillock. This critical care time does not reflect procedure time, or teaching time or supervisory time of PA/NP/Med student/Med Resident etc but could involve care discussion time.  Oswald Hillock, M.D. [facility] Pulmonary/Critical Care Medicine. Pager:  After hours pager: [phone].

## 2018-09-23 NOTE — Progress Notes (Signed)
CRITICAL VALUE ALERT  Critical Value:  Mag 0.8  Date & Time Notied:  09/23/18 0526  Provider Notified: elink  Orders Received/Actions taken: awaiting orders

## 2018-09-23 NOTE — Progress Notes (Signed)
eLink Physician-Brief Progress Note Patient Name: [name] DOB: October 27, 1955 MRN: [id number]   Date of Service  09/23/2018  HPI/Events of Note  Patent with early perineal skin breakdown. Nursing request for Foley Catheter.  eICU Interventions  Will order Foley catheter placed.      Intervention Category Major Interventions: Other:  Lysle Dingwall 09/23/2018, 3:48 AM

## 2018-09-23 NOTE — H&P (Addendum)
NAME:  [name], MRN:  [id number], DOB:  03-20-1956, LOS: 0 ADMISSION DATE:  09/23/2018, CONSULTATION DATE:  09/23/2018 REFERRING MD:  None , CHIEF COMPLAINT:  Septic shock   History of present illness   [name] is a 63 year old female with history of type 2 diabetes mellitus, heart failure with reduced ejection fraction, atrial fibrillation on anticoagulation with Eliquis who presents as a transfer from [place] for higher level of care for septic shock.  Admitted with fever, right hydronephrosis/pyelonephritis and septic shock with gram-negative sepsis   While there there was also concern for coronavirus infection.  She had a flu swab done that was negative.  The concern was due to her fever.  And recently she was at a wedding with 20 people.     Labs at the OSH on 3/21 WBC 14.2 Hgb 9.1 Plts 110 D dimer 4728 ABG 3/21 at 2304 hrs PH 7.23 PCO2 25 PO2 71 Na 136 K 3.9 Cl 107 CO2 13 AG 20 BUN 33 Crt 2.7  LA 5.2-->5.5 Procalcitonin >200 COVID 19 Pending  C.diff Positive by PCR Blood culture-Gram negative rods  Flu swab-Negative     CXR No active cardiopulmonary disease. Mild cardiomegaly  Past Medical History  -Type II DM -HFrEF -A-fib on anticoagulation  Significant Hospital Events   -Transferred to [facility] from [facility]SH  Consults:  -Interventional radiology  Procedures:  -None   Significant Diagnostic Tests:  CT abdomen and pelvis 3/20 1-39mm right UPJ stone with moderate right hydronephrosis and prominent right perinephrid edema 83mm non obstructing stone upper pole right kidney  Abdominal Ultrasound  Probable fatty infiltration of liver  Moderate hydronephrosis   Echo 08/2018  1. The left ventricle has decreased systolic function with an ejection fraction of 40-45%. The cavity size was normal. There is moderate to severe concentric left ventricular hypertrophy.  2. The right ventricle has normal systolic function. The cavity was normal. There is no  increase in right ventricular wall thickness.  3. The tricuspid valve is normal in structure.  4. The pulmonic valve was normal in structure.  5. Right atrial pressure is estimated at 3 mmHg.  6. The aortic root, ascending aorta and aortic arch are normal in size and structure.  7. The aortic valve is tricuspid.  8. The mitral valve is normal in structure with mild MR  Micro Data:  -Blood culture from [place]-Gram negative rods  -Stool-Positive for C.diff  Antimicrobials:  -PO vanc 3/20 >> -Zosyn 3/20 >>  SUBJ -  Complains of anxiety, right flank pain Afebrile On lower pressors, Levophed and vasopressin, critically ill 1 60 cc of urine recorded  Objective   Blood pressure (!) 151/75, pulse (!) 118, temperature 98.9 F (37.2 C), temperature source Oral, resp. rate (!) 27, height 4\' 11"  (1.499 m), weight 72.4 kg, SpO2 98 %. CVP:  [9 mmHg] 9 mmHg      Intake/Output Summary (Last 24 hours) at 09/23/2018 2355 Last data filed at 09/23/2018 0800 Gross per 24 hour  Intake 1740.05 ml  Output 260 ml  Net 1480.05 ml   Filed Weights   09/23/18 0116  Weight: 72.4 kg   Examination:  Elderly woman, in mild distress due to pain in position in bed, No pallor, no icterus, no JVD S1-S2 tacky, regular sinus on monitor Soft nontender abdomen, right flank tenderness Decreased breath sounds bilateral, Alert and interactive, nonfocal  1+ edema   Chest x-ray personally reviewed which shows bilateral lower lobe atelectasis/infiltrate  Assessment &  Plan:  #Septic shock #Metabolic acidosis #Gram negative rod bacteremia  Patient with septic shock due to gram negative rod bacteremia from urinary source. She has right sided kidney stones causing moderate hydronephrosis.   -Continue Zosyn -Await ID of organism and blood culture -Discussed with IR and urology-since she is hemodynamically improved, preference will be for urology to place stent rather than percutaneous drainage -Stress  dose steroids   #C.diff colitis Had diarrhea on presentation and C.diff test was positive. -Continue PO vancomycin  -Enteric precautions  #Acute kidney Injury #Obstructive uropathy AKI is likely multifactorial partly due to obstructive uropathy from kidney stones and partly prerenal due to shock. Her baseline creatinine is 1-1.3. Creatinine rising from 2.7-3.3 -Continue bicarbonate drip -Daily BMP -Avoid nephrotoxic agents -Renally dose all medications  Severe hypomagnesemia will be repleted with 4 g and recheck  #Atrial fibrillation  Patient with history of a-fib on carvedilol and fleicanide at home. She is also anticoagulated on eliquis. She was on a heparin gtt at the outside hospital.  Per patient last dose was given at [facility] on Saturday  -Hold carvedilol due to shock -Continue flecainide -Hold Eliquis and give Kcentra to reverse in anticipation of procedure   #Chronic systolic Heart failure -Hold entresto, carvedilol and diuretics -Will resume as appropriate once shock resolves  #Concern for COVID 19 Patient is very low risk. Her low grade fever is due to GNR bacteremia. Test already obtained at OSH -Follow test results   Best practice:  Diet: NPO  Pain/Anxiety/Delirium protocol (if indicated): Continue home ativan VAP protocol (if indicated): N/A DVT prophylaxis: Hold pharmacological DVT prophylaxis for now as she awaits nephrostomy tube, SCDs GI prophylaxis: Continue home PPI  Glucose control: Slidiing scale to maintain euglycemia  Mobility: PT and OT once appropriate Code Status: Full code  Family Communication: No family at bedside  Disposition: ICU   The patient is critically ill with multiple organ systems failure and requires high complexity decision making for assessment and support, frequent evaluation and titration of therapies, application of advanced monitoring technologies and extensive interpretation of multiple databases. Critical Care Time devoted  to patient care services described in this note independent of APP/resident  time is 32 minutes.   Kara Mead MD. [place] Flood. [facility] Pager [phone] If no response call [phone]   09/23/2018

## 2018-09-23 NOTE — Consult Note (Signed)
Consultation: Right hydronephrosis, right proximal ureteral stone, sepsis Requested by: Dr. Kara Mead  History of Present Illness: [name] is a 63 year old female who follows with Dr. Amalia Hailey [facility] urology for recurrent UTI and history of kidney stones.  She developed right flank pain a couple of days ago and CT scan at [facility] revealed a 1 to 2 mm right proximal stone and moderate hydronephrosis and perinephric stranding.  She is in septic shock with gram-negative bacteremia and transferred to [facility].  Today, she noted her flank pain improved and in fact prior to the catheter placement she felt some pressure in the bladder and urgency to void and then a release.  She was wondering if she passed the stone.  IR was going to place a right nephrostomy as this was the plan at the outside hospital.  Prior to placing the nephrostomy urology was consulted. Renal ultrasound was obtained which showed resolution of the right hydronephrosis, no fluid collections around the kidney and no left hydronephrosis.  Her urine output was 410 mL over the past 9 hours but she also has about 500 cc of clear urine in the bag and the tube filling quickly.  Per Dr. Elsworth Soho she is responding to treatment and pressors are being slowly weaned off. Her Cr here is 3.26 with a prior level of 1.2.   Per Dr. Elsworth Soho, there was also concern for coronavirus infection.  She had a flu swab done that was negative.  The concern was due to her fever and recently she was at a wedding with 20 people.  Modifying factors: There are no other modifying factors  Associated signs and symptoms: There are no other associated signs and symptoms Aggravating and relieving factors: There are no other aggravating or relieving factors Severity: Moderate Duration: Persistent   Past Medical History:  Diagnosis Date  . Arthritis    "back, knees, arms, wrists" (05/15/2013)  . Asthma   . Breast cancer ([place])   . CHF (congestive heart failure)  ([place])    "mild" (05/15/2013)  . Chronic bronchitis ([place])   . Chronic lower back pain   . Dysrhythmia    atrial fib/dr [facility]  . Family history of anesthesia complication    "my mother also had PONV" (05/15/2013)  . GERD (gastroesophageal reflux disease)   . Heart murmur   . High cholesterol   . Kidney stones   . Migraine    "haven't had one in the early 2000's" (05/15/2013)  . Pneumonia    "used to be chronic; last time I had it was 2013" (05/15/2013)  . PONV (postoperative nausea and vomiting)   . Recurrent UTI (urinary tract infection)    "from the continuous kidney stones; take Macrodantin qd" (05/15/2013)  . Sepsis ([place]) 05   kidney stone infection  . Tension headache   . Type II diabetes mellitus ([place])    Past Surgical History:  Procedure Laterality Date  . 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 BREAST BIOPSY Left 02/2001  . BREAST LUMPECTOMY Left 02/2001  . BREAST LUMPECTOMY Left 09/23/2013   Procedure: LEFT LUMPECTOMY WITH SPECIMEN MAMMOGRAM;  Surgeon: Adin Hector, MD;  Location: [facility];  Service: General;  Laterality: Left;  . COLONOSCOPY    . DIAGNOSTIC LAPAROSCOPY     cyst-near ovary  . LITHOTRIPSY     "2-3 times prior to 2002" (05/15/2013)  .  SPINAL FUSION N/A 05/08/2013   Procedure: T9-S1 INSTRUMENTED FUSION T12 -S1 DECOMPRESSION;  Surgeon: Melina Schools, MD;  Location: [facility];  Service: Orthopedics;  Laterality: N/A;  . TUBAL LIGATION Bilateral 1985    Home Medications:  Medications Prior to Admission  Medication Sig Dispense Refill Last Dose  . acetaminophen (TYLENOL) 500 MG tablet Take 500 mg every 6 (six) hours as needed by mouth.   Taking  . albuterol (PROVENTIL HFA;VENTOLIN HFA) 108 (90 Base) MCG/ACT inhaler Inhale 2 puffs into the lungs as needed for wheezing or shortness of breath. 1 Inhaler 0 Taking  . ALPRAZolam (XANAX)  0.25 MG tablet alprazolam 0.25 mg tablet  TAKE 1 TABLET BY MOUTH 3 TIMES A DAY   Taking  . atorvastatin (LIPITOR) 10 MG tablet TAKE 1 TABLET ONCE A DAY (AT BEDTIME)  1 Taking  . carvedilol (COREG) 6.25 MG tablet Take 6.25 mg by mouth 2 (two) times daily with a meal.    Taking  . ELIQUIS 5 MG TABS tablet TAKE 1 TABLET BY MOUTH TWICE A DAY 60 tablet 5   . flecainide (TAMBOCOR) 50 MG tablet Take 50 mg by mouth 2 (two) times daily.    Taking  . fluticasone (FLONASE) 50 MCG/ACT nasal spray PLACE 1 SPRAY INTO BOTH NOSTRILS 2 (TWO) TIMES DAILY. 16 g 0 Taking  . glipiZIDE (GLUCOTROL XL) 5 MG 24 hr tablet Take 10 mg by mouth daily with breakfast.    Taking  . loratadine (CLARITIN) 10 MG tablet Take 10 mg by mouth daily as needed for allergies.   Taking  . metFORMIN (GLUCOPHAGE) 1000 MG tablet Take 1,000 mg by mouth 2 (two) times daily with a meal.   Taking  . montelukast (SINGULAIR) 10 MG tablet TAKE 1 TABLET BY MOUTH EVERYDAY AT BEDTIME 30 tablet 3   . omeprazole (PRILOSEC) 20 MG capsule Take 20 mg by mouth daily.    Taking  . sacubitril-valsartan (ENTRESTO) 24-26 MG Take 1 tablet by mouth 2 (two) times daily. 60 tablet 3   . sertraline (ZOLOFT) 50 MG tablet Take 50 mg by mouth daily.  5 Taking  . sitaGLIPtin (JANUVIA) 100 MG tablet Take 100 mg by mouth daily.   Taking   Allergies:  Allergies  Allergen Reactions  . Other Nausea And Vomiting and Swelling    Shrimp if eat a lot  . Cleocin [Clindamycin Hcl] Other (See Comments)    "irritated my esophagus"  . Morphine And Related Nausea And Vomiting  . Pneumococcal Vaccines Other (See Comments)    Really high fever, and flu symptoms. MD instructed not to give  . Buprenorphine Hcl Nausea And Vomiting    Family History  Problem Relation Age of Onset  . COPD Mother   . Heart disease Mother   . Breast cancer Mother   . Kidney cancer Father   . Liver cancer Brother   . Environmental Allergies Other   . Lupus Other        niece  . Environmental  Allergies Son    Social History:  reports that she is a non-smoker but has been exposed to tobacco smoke. She has never used smokeless tobacco. She reports that she does not drink alcohol or use drugs.  ROS: A complete review of systems was performed.  All systems are negative except for pertinent findings as noted. Review of Systems  All other systems reviewed and are negative.    Physical Exam:  Vital signs in last 24 hours: Temp:  [98.1 F (  36.7 C)-98.9 F (37.2 C)] 98.1 F (36.7 C) (03/22 1129) Pulse Rate:  [113-124] 113 (03/22 1200) Resp:  [21-42] 21 (03/22 1200) BP: (98-151)/(52-84) 151/75 (03/22 0700) SpO2:  [90 %-98 %] 92 % (03/22 1200) Arterial Line BP: (93-164)/(39-58) 115/54 (03/22 1200) Weight:  [72.4 kg] 72.4 kg (03/22 0116) General:  Alert and oriented, No acute distress HEENT: Normocephalic, atraumatic Cardiovascular: Regular rate and rhythm Lungs: Regular rate and effort Abdomen: Soft, nontender, nondistended, no abdominal masses Back: No CVA tenderness Extremities: No edema Neurologic: Grossly intact GU: Urine clear  Laboratory Data:  Results for orders placed or performed during the hospital encounter of 09/23/18 (from the past 24 hour(s))  Glucose, capillary     Status: Abnormal   Collection Time: 09/23/18  1:21 AM  Result Value Ref Range   Glucose-Capillary 155 (H) 70 - 99 mg/dL  MRSA PCR Screening     Status: None   Collection Time: 09/23/18  1:24 AM  Result Value Ref Range   MRSA by PCR NEGATIVE NEGATIVE  Blood gas, arterial     Status: Abnormal   Collection Time: 09/23/18  3:00 AM  Result Value Ref Range   O2 Content 2.0 L/min   Delivery systems NASAL CANNULA    pH, Arterial 7.337 (L) 7.350 - 7.450   pCO2 arterial 26.4 (L) 32.0 - 48.0 mmHg   pO2, Arterial 84.0 83.0 - 108.0 mmHg   Bicarbonate 13.7 (L) 20.0 - 28.0 mmol/L   Acid-base deficit 10.9 (H) 0.0 - 2.0 mmol/L   O2 Saturation 96.0 %   Patient temperature 98.9    Collection site A-LINE     Drawn by DRAWN BY RN    Sample type ARTERIAL DRAW   Magnesium     Status: Abnormal   Collection Time: 09/23/18  4:08 AM  Result Value Ref Range   Magnesium 0.8 (LL) 1.7 - 2.4 mg/dL  Phosphorus     Status: Abnormal   Collection Time: 09/23/18  4:08 AM  Result Value Ref Range   Phosphorus 5.3 (H) 2.5 - 4.6 mg/dL  Brain natriuretic peptide     Status: Abnormal   Collection Time: 09/23/18  4:08 AM  Result Value Ref Range   B Natriuretic Peptide >4,500.0 (H) 0.0 - 100.0 pg/mL  CBC WITH DIFFERENTIAL     Status: Abnormal   Collection Time: 09/23/18  4:08 AM  Result Value Ref Range   WBC 23.2 (H) 4.0 - 10.5 K/uL   RBC 2.72 (L) 3.87 - 5.11 MIL/uL   Hemoglobin 8.0 (L) 12.0 - 15.0 g/dL   HCT 25.8 (L) 36.0 - 46.0 %   MCV 94.9 80.0 - 100.0 fL   MCH 29.4 26.0 - 34.0 pg   MCHC 31.0 30.0 - 36.0 g/dL   RDW 16.1 (H) 11.5 - 15.5 %   Platelets 72 (L) 150 - 400 K/uL   nRBC 0.0 0.0 - 0.2 %   Neutrophils Relative % 77 %   Neutro Abs 18.0 (H) 1.7 - 7.7 K/uL   Lymphocytes Relative 4 %   Lymphs Abs 0.8 0.7 - 4.0 K/uL   Monocytes Relative 3 %   Monocytes Absolute 0.6 0.1 - 1.0 K/uL   Eosinophils Relative 0 %   Eosinophils Absolute 0.1 0.0 - 0.5 K/uL   Basophils Relative 0 %   Basophils Absolute 0.1 0.0 - 0.1 K/uL   WBC Morphology INCREASED BANDS (>20% BANDS)    Immature Granulocytes 16 %   Abs Immature Granulocytes 3.66 (H) 0.00 - 0.07 K/uL  Burr Cells PRESENT    Polychromasia PRESENT   Comprehensive metabolic panel     Status: Abnormal   Collection Time: 09/23/18  4:08 AM  Result Value Ref Range   Sodium 136 135 - 145 mmol/L   Potassium 4.2 3.5 - 5.1 mmol/L   Chloride 105 98 - 111 mmol/L   CO2 14 (L) 22 - 32 mmol/L   Glucose, Bld 167 (H) 70 - 99 mg/dL   BUN 38 (H) 8 - 23 mg/dL   Creatinine, Ser 3.26 (H) 0.44 - 1.00 mg/dL   Calcium 6.7 (L) 8.9 - 10.3 mg/dL   Total Protein 5.7 (L) 6.5 - 8.1 g/dL   Albumin 2.5 (L) 3.5 - 5.0 g/dL   AST 56 (H) 15 - 41 U/L   ALT 39 0 - 44 U/L   Alkaline  Phosphatase 35 (L) 38 - 126 U/L   Total Bilirubin 1.0 0.3 - 1.2 mg/dL   GFR calc non Af Amer 14 (L) >60 mL/min   GFR calc Af Amer 17 (L) >60 mL/min   Anion gap 17 (H) 5 - 15  Protime-INR     Status: Abnormal   Collection Time: 09/23/18  4:08 AM  Result Value Ref Range   Prothrombin Time 32.3 (H) 11.4 - 15.2 seconds   INR 3.2 (H) 0.8 - 1.2  Glucose, capillary     Status: Abnormal   Collection Time: 09/23/18  4:14 AM  Result Value Ref Range   Glucose-Capillary 157 (H) 70 - 99 mg/dL  Urinalysis, Complete w Microscopic     Status: Abnormal   Collection Time: 09/23/18  4:39 AM  Result Value Ref Range   Color, Urine YELLOW YELLOW   APPearance CLOUDY (A) CLEAR   Specific Gravity, Urine 1.028 1.005 - 1.030   pH 5.0 5.0 - 8.0   Glucose, UA NEGATIVE NEGATIVE mg/dL   Hgb urine dipstick LARGE (A) NEGATIVE   Bilirubin Urine NEGATIVE NEGATIVE   Ketones, ur NEGATIVE NEGATIVE mg/dL   Protein, ur 100 (A) NEGATIVE mg/dL   Nitrite NEGATIVE NEGATIVE   Leukocytes,Ua LARGE (A) NEGATIVE   RBC / HPF 21-50 0 - 5 RBC/hpf   WBC, UA >50 (H) 0 - 5 WBC/hpf   Bacteria, UA MANY (A) NONE SEEN   Squamous Epithelial / LPF 0-5 0 - 5   Amorphous Crystal PRESENT   Lactic acid, plasma     Status: Abnormal   Collection Time: 09/23/18  5:23 AM  Result Value Ref Range   Lactic Acid, Venous 6.1 (HH) 0.5 - 1.9 mmol/L  Culture, blood (routine x 2)     Status: None (Preliminary result)   Collection Time: 09/23/18  6:03 AM  Result Value Ref Range   Specimen Description BLOOD RIGHT ANTECUBITAL    Special Requests      BOTTLES DRAWN AEROBIC ONLY Blood Culture adequate volume   Culture      NO GROWTH <12 HOURS Performed at [facility], [place] [address] [place], [place] [address]    Report Status PENDING   Lactic acid, plasma     Status: Abnormal   Collection Time: 09/23/18  7:17 AM  Result Value Ref Range   Lactic Acid, Venous 6.5 (HH) 0.5 - 1.9 mmol/L  Glucose, capillary     Status: Abnormal   Collection  Time: 09/23/18  7:51 AM  Result Value Ref Range   Glucose-Capillary 116 (H) 70 - 99 mg/dL  Glucose, capillary     Status: Abnormal   Collection Time: 09/23/18  11:19 AM  Result Value Ref Range   Glucose-Capillary 106 (H) 70 - 99 mg/dL   Recent Results (from the past 240 hour(s))  MRSA PCR Screening     Status: None   Collection Time: 09/23/18  1:24 AM  Result Value Ref Range Status   MRSA by PCR NEGATIVE NEGATIVE Final    Comment:        The GeneXpert MRSA Assay (FDA approved for NASAL specimens only), is one component of a comprehensive MRSA colonization surveillance program. It is not intended to diagnose MRSA infection nor to guide or monitor treatment for MRSA infections. Performed at [facility], [place] [address] [place], [place] [address]   Culture, blood (routine x 2)     Status: None (Preliminary result)   Collection Time: 09/23/18  6:03 AM  Result Value Ref Range Status   Specimen Description BLOOD RIGHT ANTECUBITAL  Final   Special Requests   Final    BOTTLES DRAWN AEROBIC ONLY Blood Culture adequate volume   Culture   Final    NO GROWTH <12 HOURS Performed at [facility], [place] [address], [place], [place] [address]    Report Status PENDING  Incomplete   Creatinine: Recent Labs    09/23/18 0408  CREATININE 3.26*    Impression/Assessment/plan:   Right proximal stone -she may have passed the stone.  Ultrasound is not a good test for stones, she would need a CT scan or ureteroscopy.  Right hydronephrosis-resolved, again this may have been from stone passage or stone movement.  At this point not certain if ureteral stent would help and does not look like she needs a nephrostomy tube.  Acute kidney injury- related to obstruction and sepsis.  Excellent urine output.  Sepsis- continue resuscitation pressors and antibiotics per primary team.  Will follow.  Festus Aloe 09/23/2018, 1:02 PM \

## 2018-09-23 NOTE — Progress Notes (Signed)
[facility] Progress Note Patient Name: EMMILEE REAMER DOB: 06-Jun-1956 MRN: [id number]   Date of Service  09/23/2018  HPI/Events of Note  Ca++ = 6.7, Mg++ = 0.8 and Creatinine = 3.26.  eICU Interventions  Will replace Mg++ and CA++.        Sommer,Steven Cornelia Copa 09/23/2018, 5:41 AM

## 2018-09-23 NOTE — Progress Notes (Signed)
CRITICAL VALUE ALERT  Critical Value:  Lactic 6.1  Date & Time Notied:  09/23/18 0558  Provider Notified: Warren Lacy  Orders Received/Actions taken: see orders/note

## 2018-09-23 NOTE — Procedures (Signed)
Arterial Catheter Insertion Procedure Note [name] [id number] 06/02/1956  Procedure: Insertion of Arterial Catheter  Indications: Blood pressure monitoring  Procedure Details Consent: Risks of procedure as well as the alternatives and risks of each were explained to the (patient/caregiver).  Consent for procedure obtained. Time Out: Verified patient identification, verified procedure, site/side was marked, verified correct patient position, special equipment/implants available, medications/allergies/relevent history reviewed, required imaging and test results available.  Performed  Maximum sterile technique was used including antiseptics, cap, gloves, gown, hand hygiene, mask and sheet. Skin prep: Chlorhexidine 20 gauge catheter was inserted into right radial artery using the Seldinger technique. ULTRASOUND GUIDANCE USED: NO Evaluation Blood flow good; BP tracing good. Complications: No apparent complications.  Aline performed per MD order. Initial BP of 130/60. No complications.   [name] [facility] 09/23/2018

## 2018-09-23 NOTE — Progress Notes (Signed)
eLink Physician-Brief Progress Note Patient Name: [name] DOB: 30-Sep-1955 MRN: [id number]   Date of Service  09/23/2018  HPI/Events of Note  Lactic Acid = 6.1. LVEF = 40-45%. Hgb =  8.0 CVP = 9.0 .  eICU Interventions  Will bolus with 0.9 NaCl 500 mL IV now.      Intervention Category Major Interventions: Acid-Base disturbance - evaluation and management  Arth Nicastro Eugene 09/23/2018, 6:21 AM

## 2018-09-23 NOTE — Progress Notes (Signed)
CRITICAL VALUE ALERT  Critical Value:  Lactic Acid 6.5  Date & Time Notied:  7:47 AM 09/23/18   Provider Notified: Dr. Elsworth Soho  Orders Received/Actions taken: Continue to monitor

## 2018-09-23 NOTE — Progress Notes (Signed)
  Echocardiogram 2D Echocardiogram has been performed.  Limited echo performed as to reduce employee exposure.  Madelaine Etienne 09/23/2018, 9:32 AM

## 2018-09-24 LAB — GLUCOSE, CAPILLARY
GLUCOSE-CAPILLARY: 149 mg/dL — AB (ref 70–99)
Glucose-Capillary: 162 mg/dL — ABNORMAL HIGH (ref 70–99)
Glucose-Capillary: 131 mg/dL — ABNORMAL HIGH (ref 70–99)
Glucose-Capillary: 122 mg/dL — ABNORMAL HIGH (ref 70–99)
Glucose-Capillary: 108 mg/dL — ABNORMAL HIGH (ref 70–99)

## 2018-09-24 LAB — MAGNESIUM
Magnesium: 2.1 mg/dL (ref 1.7–2.4)
Magnesium: 2.2 mg/dL (ref 1.7–2.4)

## 2018-09-24 LAB — BASIC METABOLIC PANEL
ANION GAP: 16 — AB (ref 5–15)
Anion gap: 13 (ref 5–15)
BUN: 47 mg/dL — ABNORMAL HIGH (ref 8–23)
BUN: 55 mg/dL — ABNORMAL HIGH (ref 8–23)
CHLORIDE: 102 mmol/L (ref 98–111)
CO2: 21 mmol/L — ABNORMAL LOW (ref 22–32)
CO2: 23 mmol/L (ref 22–32)
Calcium: 6.2 mg/dL — CL (ref 8.9–10.3)
Calcium: 6.6 mg/dL — ABNORMAL LOW (ref 8.9–10.3)
Chloride: 99 mmol/L (ref 98–111)
Creatinine, Ser: 2.81 mg/dL — ABNORMAL HIGH (ref 0.44–1.00)
Creatinine, Ser: 2.91 mg/dL — ABNORMAL HIGH (ref 0.44–1.00)
GFR calc Af Amer: 20 mL/min — ABNORMAL LOW (ref >60)
GFR calc Af Amer: 19 mL/min — ABNORMAL LOW (ref >60)
GFR calc non Af Amer: 17 mL/min — ABNORMAL LOW (ref >60)
GFR calc non Af Amer: 16 mL/min — ABNORMAL LOW (ref >60)
Glucose, Bld: 163 mg/dL — ABNORMAL HIGH (ref 70–99)
Glucose, Bld: 128 mg/dL — ABNORMAL HIGH (ref 70–99)
Potassium: 4 mmol/L (ref 3.5–5.1)
Potassium: 3.8 mmol/L (ref 3.5–5.1)
SODIUM: 136 mmol/L (ref 135–145)
Sodium: 138 mmol/L (ref 135–145)

## 2018-09-24 LAB — DIC (DISSEMINATED INTRAVASCULAR COAGULATION)PANEL
D-Dimer, Quant: 19.06 ug/mL-FEU — ABNORMAL HIGH (ref 0.00–0.50)
Fibrinogen: 767 mg/dL — ABNORMAL HIGH (ref 210–475)
INR: 1.7 — ABNORMAL HIGH (ref 0.8–1.2)
Platelets: 38 10*3/uL — ABNORMAL LOW (ref 150–400)
Prothrombin Time: 20 seconds — ABNORMAL HIGH (ref 11.4–15.2)
Smear Review: NONE SEEN
aPTT: 35 seconds (ref 24–36)

## 2018-09-24 LAB — PROTIME-INR
INR: 1.8 — ABNORMAL HIGH (ref 0.8–1.2)
Prothrombin Time: 20.7 seconds — ABNORMAL HIGH (ref 11.4–15.2)

## 2018-09-24 LAB — CBC
HCT: 24.5 % — ABNORMAL LOW (ref 36.0–46.0)
Hemoglobin: 7.5 g/dL — ABNORMAL LOW (ref 12.0–15.0)
MCH: 29.5 pg (ref 26.0–34.0)
MCHC: 30.6 g/dL (ref 30.0–36.0)
MCV: 96.5 fL (ref 80.0–100.0)
Platelets: 44 10*3/uL — ABNORMAL LOW (ref 150–400)
RBC: 2.54 MIL/uL — ABNORMAL LOW (ref 3.87–5.11)
RDW: 16.4 % — ABNORMAL HIGH (ref 11.5–15.5)
WBC: 24.7 10*3/uL — ABNORMAL HIGH (ref 4.0–10.5)
nRBC: 0.1 % (ref 0.0–0.2)

## 2018-09-24 LAB — LACTIC ACID, PLASMA: Lactic Acid, Venous: 2.1 mmol/L (ref 0.5–1.9)

## 2018-09-24 MED ORDER — PIPERACILLIN-TAZOBACTAM 3.375 G IVPB
3.3750 g | Freq: Three times a day (TID) | INTRAVENOUS | Status: DC
  Administered 2018-09-24: 3.375 g via INTRAVENOUS
  Filled 2018-09-24 (×2): qty 50

## 2018-09-24 MED ORDER — PIPERACILLIN-TAZOBACTAM IN DEX 2-0.25 GM/50ML IV SOLN
2.2500 g | Freq: Four times a day (QID) | INTRAVENOUS | Status: DC
  Administered 2018-09-25: 2.25 g via INTRAVENOUS
  Filled 2018-09-24 (×2): qty 50

## 2018-09-24 MED ORDER — PHENOL 1.4 % MT LIQD
1.0000 | OROMUCOSAL | Status: DC | PRN
  Filled 2018-09-24: qty 177

## 2018-09-24 NOTE — Progress Notes (Signed)
Please find blood culture result from [facility] under media section. Thank you for your excellent care! Bretta Bang

## 2018-09-24 NOTE — Progress Notes (Signed)
[facility] Progress Note Patient Name: MODESTA SAMMONS DOB: 05-15-1956 MRN: [id number]   Date of Service  09/24/2018  HPI/Events of Note  Hypotension - BP = 91/51 with MAP = 61. LVEF = 40% to 45%.   eICU Interventions  Will order: 1. Restart Norepinephrine IV infusion. Titrate to MAP >= 65.  2. Check CVP     Intervention Category Major Interventions: Hypotension - evaluation and management  Rice Walsh Eugene 09/24/2018, 10:15 PM

## 2018-09-24 NOTE — Progress Notes (Signed)
NAME:  [name], MRN:  [id number], DOB:  21-May-1956, LOS: 1 ADMISSION DATE:  09/23/2018, CONSULTATION DATE:  09/23/2018 REFERRING MD:  None , CHIEF COMPLAINT:  Septic shock   History of present illness   [name] is a 63 year old female with history of type 2 diabetes mellitus, heart failure with reduced ejection fraction, atrial fibrillation on anticoagulation with Eliquis who presents as a transfer from [place] for higher level of care for septic shock.  Admitted with fever, right hydronephrosis/pyelonephritis and septic shock with gram-negative sepsis   While there there was also concern for coronavirus infection.  She had a flu swab done that was negative.  The concern was due to her fever.  And recently she was at a wedding with 20 people.    Labs at the OSH on 3/21 WBC 14.2 Hgb 9.1 Plts 110 D dimer 4728 ABG 3/21 at 2304 hrs PH 7.23 PCO2 25 PO2 71 Na 136 K 3.9 Cl 107 CO2 13 AG 20 BUN 33 Crt 2.7  LA 5.2-->5.5 Procalcitonin >200 COVID 19 Pending  C.diff Positive by PCR Blood culture-Gram negative rods  Flu swab-Negative   CXR No active cardiopulmonary disease. Mild cardiomegaly  Past Medical History  -Type II DM -HFrEF -A-fib on anticoagulation  Significant Hospital Events   -Transferred to [facility] from OSH  Consults:  -Interventional radiology  Procedures:  -None   Significant Diagnostic Tests:  CT abdomen and pelvis 3/20 1-47mm right UPJ stone with moderate right hydronephrosis and prominent right perinephrid edema 54mm non obstructing stone upper pole right kidney  Abdominal Ultrasound  Probable fatty infiltration of liver  Moderate hydronephrosis   Echo 08/2018  1. The left ventricle has decreased systolic function with an ejection fraction of 40-45%. The cavity size was normal. There is moderate to severe concentric left ventricular hypertrophy.  2. The right ventricle has normal systolic function. The cavity was normal. There is no  increase in right ventricular wall thickness.  3. The tricuspid valve is normal in structure.  4. The pulmonic valve was normal in structure.  5. Right atrial pressure is estimated at 3 mmHg.  6. The aortic root, ascending aorta and aortic arch are normal in size and structure.  7. The aortic valve is tricuspid.  8. The mitral valve is normal in structure with mild MR  Repeat renal ultrasound 3/22>> resolved hydronephrosis  Micro Data:  -Blood culture from [place]-Gram negative rods  -Stool-Positive for C.diff  Antimicrobials:  -PO vanc 3/20 >> -Zosyn 3/20 >>  Subjective / Interval events: Denies any pain, right flank pain is improved Vasopressin off, norepinephrine weaned to 2 Good urine output last 24 hours Tolerating PO  Objective   Blood pressure 120/66, pulse (!) 102, temperature 97.8 F (36.6 C), temperature source Oral, resp. rate 20, height 4\' 11"  (1.499 m), weight 76.9 kg, SpO2 100 %. CVP:  [10 mmHg] 10 mmHg      Intake/Output Summary (Last 24 hours) at 09/24/2018 1156 Last data filed at 09/24/2018 0900 Gross per 24 hour  Intake 1555.43 ml  Output 1115 ml  Net 440.43 ml   Filed Weights   09/23/18 0116 09/24/18 0347  Weight: 72.4 kg 76.9 kg   Examination:  Lying comfortably in bed, in no distress Awake, alert, interacting, follows commands, moves all extremities Oropharynx clear, no icterus, pupils equal Heart regular without a murmur Lungs distant especially at both bases, no wheezing, no crackles Abdomen soft, nondistended and nontender.  Positive bowel sounds 1+ lower extremity  edema Foley in place, rectal tube in place    Assessment & Plan:  #Septic shock #Metabolic acidosis #Gram negative rod bacteremia  Patient with septic shock due to gram negative rod bacteremia from urinary source. She has right sided kidney stones causing moderate hydronephrosis.  Plans: Continue Zosyn as ordered Plan to communicate with [facility] 3/23, assess for  speciation on urine and blood cultures Appreciate IR and urology assistance.  No clear indication for percutaneous drain given resolution of her hydronephrosis.  Continue to follow Continue stress dose steroids for now Wean norepinephrine as able Follow lactate for clearance  #C.diff colitis Had diarrhea on presentation and C.diff test was positive. Plans: Enteral vancomycin Enteric precautions as ordered -Enteric precautions  #Acute kidney Injury, improving 3/23 #Obstructive uropathy AKI is likely multifactorial partly due to obstructive uropathy from kidney stones and partly prerenal due to shock. Her baseline creatinine is 1-1.3. Plans: Bicarbonate drip discontinued Follow renal function, urine output Avoid nephrotoxins Medications renally dosed  #Hypomagnesemia  Plans: Replaced, follow repeat magnesium  Severe thrombocytopenia.  Suspect related to sepsis, consider DIC  #Atrial fibrillation  Patient with history of a-fib on carvedilol and fleicanide at home. She is also anticoagulated on eliquis. She was on a heparin gtt at the outside hospital.  Per patient last dose was given at [facility] on Saturday Plans: Carvedilol, Entresto, diuretics on hold Flecainide as ordered Eliquis on hold  #Chronic systolic Heart failure -Hold entresto, carvedilol and diuretics -Will resume as appropriate once shock resolves  #Concern for COVID 19 Patient is very low risk. Her low grade fever is due to GNR bacteremia. Test already obtained at OSH Plans: Follow rest results, monitor and isolate as low risk status   Best practice:  Diet: clears  Pain/Anxiety/Delirium protocol (if indicated): xanax VAP protocol (if indicated): N/A DVT prophylaxis:SCD GI prophylaxis: Continue home PPI  Glucose control: Slidiing scale to maintain euglycemia  Mobility: PT and OT once appropriate Code Status: Full code  Family Communication: No family present 3/23 Disposition: ICU   The patient is  critically ill with multiple organ systems failure and requires high complexity decision making for assessment and support, frequent evaluation and titration of therapies, application of advanced monitoring technologies and extensive interpretation of multiple databases.    Independent CC time 34 minutes   Baltazar Apo, MD, PhD 09/24/2018, 12:32 PM [facility] [phone] or if no answer [phone]

## 2018-09-24 NOTE — Progress Notes (Signed)
Elink notified of pts ca=6.2. Will continue to monitor.

## 2018-09-24 NOTE — Progress Notes (Signed)
PHARMACY NOTE:  ANTIMICROBIAL RENAL DOSAGE ADJUSTMENT  Current antimicrobial regimen includes a mismatch between antimicrobial dosage and estimated renal function.  As per policy approved by the Pharmacy & Therapeutics and Medical Executive Committees, the antimicrobial dosage will be adjusted accordingly.  Current antimicrobial dosage:  Zosyn 2.25 g IV q6h  Indication: GNR Bacteremia  Renal Function: Scr improved to 2.81 today, down from 3.26 today. UOP yesterday was 1 L (0.6 ml/kg/hr)   Estimated Creatinine Clearance: 18.3 mL/min (A) (by C-G formula based on SCr of 2.81 mg/dL (H)). []      On intermittent HD, scheduled: []      On CRRT    Antimicrobial dosage has been changed to:  Zosyn 3.375 g IV q8h  Additional comments: Despite calculated CrCl being <20 mL/min, pt's renal function and urine output is improving and when adjusted for body weight, CrCl ~20 mL/min. Due to improved renal function and indication of GNR bacteremia, will increase Zosyn dose.     Thank you for allowing pharmacy to be a part of this patient's care.  Jackson Latino, PharmD PGY1 Pharmacy Resident Phone [phone] 09/24/2018     2:59 PM

## 2018-09-25 ENCOUNTER — Inpatient Hospital Stay (HOSPITAL_COMMUNITY): Payer: Medicare Other

## 2018-09-25 DIAGNOSIS — J9601 Acute respiratory failure with hypoxia: Secondary | ICD-10-CM

## 2018-09-25 DIAGNOSIS — R4182 Altered mental status, unspecified: Secondary | ICD-10-CM

## 2018-09-25 DIAGNOSIS — R410 Disorientation, unspecified: Secondary | ICD-10-CM

## 2018-09-25 LAB — POCT I-STAT 7, (LYTES, BLD GAS, ICA,H+H)
Acid-base deficit: 7 mmol/L — ABNORMAL HIGH (ref 0.0–2.0)
Acid-base deficit: 5 mmol/L — ABNORMAL HIGH (ref 0.0–2.0)
Bicarbonate: 20.9 mmol/L (ref 20.0–28.0)
Bicarbonate: 22.2 mmol/L (ref 20.0–28.0)
Calcium, Ion: 1.02 mmol/L — ABNORMAL LOW (ref 1.15–1.40)
Calcium, Ion: 1.01 mmol/L — ABNORMAL LOW (ref 1.15–1.40)
HCT: 28 % — ABNORMAL LOW (ref 36.0–46.0)
HCT: 25 % — ABNORMAL LOW (ref 36.0–46.0)
Hemoglobin: 9.5 g/dL — ABNORMAL LOW (ref 12.0–15.0)
Hemoglobin: 8.5 g/dL — ABNORMAL LOW (ref 12.0–15.0)
O2 SAT: 100 %
O2 Saturation: 96 %
PH ART: 7.192 — AB (ref 7.350–7.450)
PO2 ART: 284 mmHg — AB (ref 83.0–108.0)
Patient temperature: 98.5
Patient temperature: 98.5
Potassium: 3.4 mmol/L — ABNORMAL LOW (ref 3.5–5.1)
Potassium: 3.6 mmol/L (ref 3.5–5.1)
Sodium: 140 mmol/L (ref 135–145)
Sodium: 140 mmol/L (ref 135–145)
TCO2: 23 mmol/L (ref 22–32)
TCO2: 24 mmol/L (ref 22–32)
pCO2 arterial: 54.4 mmHg — ABNORMAL HIGH (ref 32.0–48.0)
pCO2 arterial: 53.6 mmHg — ABNORMAL HIGH (ref 32.0–48.0)
pH, Arterial: 7.225 — ABNORMAL LOW (ref 7.350–7.450)
pO2, Arterial: 102 mmHg (ref 83.0–108.0)

## 2018-09-25 LAB — GLUCOSE, CAPILLARY
GLUCOSE-CAPILLARY: 153 mg/dL — AB (ref 70–99)
Glucose-Capillary: 117 mg/dL — ABNORMAL HIGH (ref 70–99)
Glucose-Capillary: 123 mg/dL — ABNORMAL HIGH (ref 70–99)
Glucose-Capillary: 135 mg/dL — ABNORMAL HIGH (ref 70–99)

## 2018-09-25 LAB — CBC
HEMATOCRIT: 26 % — AB (ref 36.0–46.0)
Hemoglobin: 7.9 g/dL — ABNORMAL LOW (ref 12.0–15.0)
MCH: 29.6 pg (ref 26.0–34.0)
MCHC: 30.4 g/dL (ref 30.0–36.0)
MCV: 97.4 fL (ref 80.0–100.0)
Platelets: 35 10*3/uL — ABNORMAL LOW (ref 150–400)
RBC: 2.67 MIL/uL — ABNORMAL LOW (ref 3.87–5.11)
RDW: 16.6 % — ABNORMAL HIGH (ref 11.5–15.5)
WBC: 30.5 10*3/uL — ABNORMAL HIGH (ref 4.0–10.5)
nRBC: 0.5 % — ABNORMAL HIGH (ref 0.0–0.2)

## 2018-09-25 LAB — BASIC METABOLIC PANEL
Anion gap: 19 — ABNORMAL HIGH (ref 5–15)
BUN: 60 mg/dL — ABNORMAL HIGH (ref 8–23)
CHLORIDE: 102 mmol/L (ref 98–111)
CO2: 19 mmol/L — ABNORMAL LOW (ref 22–32)
Calcium: 6.6 mg/dL — ABNORMAL LOW (ref 8.9–10.3)
Creatinine, Ser: 2.99 mg/dL — ABNORMAL HIGH (ref 0.44–1.00)
GFR calc Af Amer: 18 mL/min — ABNORMAL LOW (ref >60)
GFR calc non Af Amer: 16 mL/min — ABNORMAL LOW (ref >60)
Glucose, Bld: 116 mg/dL — ABNORMAL HIGH (ref 70–99)
Potassium: 3.9 mmol/L (ref 3.5–5.1)
Sodium: 140 mmol/L (ref 135–145)

## 2018-09-25 LAB — COMPREHENSIVE METABOLIC PANEL
ALT: 520 U/L — ABNORMAL HIGH (ref 0–44)
AST: 602 U/L — AB (ref 15–41)
Albumin: 2.4 g/dL — ABNORMAL LOW (ref 3.5–5.0)
Alkaline Phosphatase: 134 U/L — ABNORMAL HIGH (ref 38–126)
Anion gap: 16 — ABNORMAL HIGH (ref 5–15)
BUN: 65 mg/dL — AB (ref 8–23)
CO2: 20 mmol/L — ABNORMAL LOW (ref 22–32)
Calcium: 7.1 mg/dL — ABNORMAL LOW (ref 8.9–10.3)
Chloride: 103 mmol/L (ref 98–111)
Creatinine, Ser: 3.13 mg/dL — ABNORMAL HIGH (ref 0.44–1.00)
GFR calc Af Amer: 17 mL/min — ABNORMAL LOW (ref >60)
GFR calc non Af Amer: 15 mL/min — ABNORMAL LOW (ref >60)
Glucose, Bld: 138 mg/dL — ABNORMAL HIGH (ref 70–99)
POTASSIUM: 3.3 mmol/L — AB (ref 3.5–5.1)
Sodium: 139 mmol/L (ref 135–145)
Total Bilirubin: 2.7 mg/dL — ABNORMAL HIGH (ref 0.3–1.2)
Total Protein: 6.5 g/dL (ref 6.5–8.1)

## 2018-09-25 LAB — LACTIC ACID, PLASMA: Lactic Acid, Venous: 2 mmol/L (ref 0.5–1.9)

## 2018-09-25 LAB — AMMONIA
Ammonia: 47 umol/L — ABNORMAL HIGH (ref 9–35)
Ammonia: 55 umol/L — ABNORMAL HIGH (ref 9–35)

## 2018-09-25 MED ORDER — CHLORHEXIDINE GLUCONATE 0.12% ORAL RINSE (MEDLINE KIT)
15.0000 mL | Freq: Two times a day (BID) | OROMUCOSAL | Status: DC
  Administered 2018-09-25 – 2018-09-30 (×10): 15 mL via OROMUCOSAL

## 2018-09-25 MED ORDER — MIDAZOLAM HCL 2 MG/2ML IJ SOLN
2.0000 mg | INTRAMUSCULAR | Status: DC | PRN
  Administered 2018-09-26 – 2018-09-29 (×5): 2 mg via INTRAVENOUS
  Filled 2018-09-25 (×3): qty 2

## 2018-09-25 MED ORDER — MIDAZOLAM HCL 2 MG/2ML IJ SOLN
INTRAMUSCULAR | Status: AC
  Filled 2018-09-25: qty 2

## 2018-09-25 MED ORDER — PIPERACILLIN-TAZOBACTAM IN DEX 2-0.25 GM/50ML IV SOLN
2.2500 g | Freq: Four times a day (QID) | INTRAVENOUS | Status: DC
  Administered 2018-09-25 – 2018-09-26 (×4): 2.25 g via INTRAVENOUS
  Filled 2018-09-25 (×5): qty 50

## 2018-09-25 MED ORDER — CEFAZOLIN SODIUM-DEXTROSE 2-4 GM/100ML-% IV SOLN
2.0000 g | Freq: Two times a day (BID) | INTRAVENOUS | Status: DC
  Filled 2018-09-25: qty 100

## 2018-09-25 MED ORDER — FENTANYL CITRATE (PF) 100 MCG/2ML IJ SOLN
50.0000 ug | Freq: Once | INTRAMUSCULAR | Status: AC
  Administered 2018-09-25: 50 ug via INTRAVENOUS

## 2018-09-25 MED ORDER — CALCIUM GLUCONATE-NACL 1-0.675 GM/50ML-% IV SOLN
1.0000 g | Freq: Once | INTRAVENOUS | Status: AC
  Administered 2018-09-25: 1000 mg via INTRAVENOUS
  Filled 2018-09-25: qty 50

## 2018-09-25 MED ORDER — PANTOPRAZOLE SODIUM 40 MG IV SOLR
40.0000 mg | INTRAVENOUS | Status: DC
  Administered 2018-09-25: 40 mg via INTRAVENOUS
  Filled 2018-09-25: qty 40

## 2018-09-25 MED ORDER — FENTANYL 2500MCG IN NS 250ML (10MCG/ML) PREMIX INFUSION
25.0000 ug/h | INTRAVENOUS | Status: DC
  Administered 2018-09-25: 200 ug/h via INTRAVENOUS
  Administered 2018-09-26 (×2): 400 ug/h via INTRAVENOUS
  Administered 2018-09-26: 225 ug/h via INTRAVENOUS
  Administered 2018-09-27: 300 ug/h via INTRAVENOUS
  Filled 2018-09-25 (×6): qty 250

## 2018-09-25 MED ORDER — FENTANYL CITRATE (PF) 100 MCG/2ML IJ SOLN
INTRAMUSCULAR | Status: AC
  Filled 2018-09-25: qty 2

## 2018-09-25 MED ORDER — HYDROCORTISONE NA SUCCINATE PF 100 MG IJ SOLR
50.0000 mg | Freq: Two times a day (BID) | INTRAMUSCULAR | Status: DC
  Administered 2018-09-26 (×2): 50 mg via INTRAVENOUS
  Filled 2018-09-25 (×2): qty 2

## 2018-09-25 MED ORDER — ORAL CARE MOUTH RINSE
15.0000 mL | OROMUCOSAL | Status: DC
  Administered 2018-09-25 – 2018-09-30 (×50): 15 mL via OROMUCOSAL

## 2018-09-25 MED ORDER — VANCOMYCIN 50 MG/ML ORAL SOLUTION
125.0000 mg | Freq: Four times a day (QID) | ORAL | Status: DC
  Filled 2018-09-25 (×2): qty 2.5

## 2018-09-25 MED ORDER — FENTANYL BOLUS VIA INFUSION
50.0000 ug | INTRAVENOUS | Status: DC | PRN
  Administered 2018-09-26 – 2018-09-27 (×2): 50 ug via INTRAVENOUS
  Filled 2018-09-25: qty 50

## 2018-09-25 MED ORDER — MIDAZOLAM HCL 2 MG/2ML IJ SOLN
2.0000 mg | INTRAMUSCULAR | Status: DC | PRN
  Filled 2018-09-25 (×4): qty 2

## 2018-09-25 MED ORDER — ETOMIDATE 2 MG/ML IV SOLN
20.0000 mg | Freq: Once | INTRAVENOUS | Status: AC
  Administered 2018-09-25: 20 mg via INTRAVENOUS

## 2018-09-25 MED ORDER — VANCOMYCIN 50 MG/ML ORAL SOLUTION
125.0000 mg | Freq: Four times a day (QID) | ORAL | Status: DC
  Administered 2018-09-25 – 2018-09-26 (×4): 125 mg
  Filled 2018-09-25 (×5): qty 2.5

## 2018-09-25 NOTE — Progress Notes (Signed)
PCCM Brief Progress Note   PCCM called by bedside RN for critical value ABG pH 7.19 pCO2 54 PO2 102 HCO3 20.9  Other pertinent labs results include Cr 3.13 BUN 65, Alk phos 134 AST 602 ALT 520 Ammonia 47, signifying worsening renal function and impaired liver failure.  Patient has known e.coli UTI and bacteremia. CXR reveals worse worsening opacity suggestive of L sided PNA. Personally reviewed  To exam, patient was somnolent. The patient opened eyes to sternal rub, she intermittently answered Yes/No questions but would quickly fall back asleep.   Patient was discussed with additional PCCM NP Eric Form and PCCM Attending MD Dr. Lamonte Sakai. It is collectively felt that the patient's somnolence and acidosis is likely reflective of underlying gram negative sepsis, and while the patient is Low-Risk COVID-19 rule out, it is highly unlikely that the patient has COVID-19.  Dr. Lamonte Sakai discussed the case with ID Attending regarding COVID-19 precautions.   Both physicians feel it is not likely that the patient has COVID-19, and it is felt that the patient's worsening clinical picture can be explained by e.coli  bacteremia, UTI and the patient can be taken off of COVID-19 precautions.   Acute respiratory failure Acute encephalopathy, failure to protect airway Sepsis Transaminitis  Acute Kidney Injury Plan -Discontinue Low-Risk COVID-19 precautions -Continue Enteric precautions for C.Diff  -Intubate patient -follow up CXR, follow up ABG following intubation -Place OGT -Re-check Ammonia this evening, anticipate patient may need lactulose -Change antibiotics to zosyn  -Continue to trend I/O    Additional Critical Care Time: 35 minutes  Eliseo Gum MSN, AGACNP-BC [place] [phone] If no answer, [phone] 09/25/2018, 4:38 PM

## 2018-09-25 NOTE — Progress Notes (Signed)
PCCM Interval Note  [name] has been evolving, has clinically changed over the afternoon.  She has evolved some delirium, now hypersomnolence.  She pulled her Foley and was trying to get up out of bed earlier.  Initial work-up shows hyperammonemia, rising transaminases, a mixed metabolic and respiratory acidosis in the setting of some progression of her acute renal failure.  Overall she is at risk for requiring increased support for respiratory failure, possibly her renal failure as well.  She remains on antibiotics for her E. coli UTI, pyelonephritis, bacteremia and her shock has improved.  Vitals:   09/25/18 1345 09/25/18 1400 09/25/18 1447 09/25/18 1600  BP:  116/62  127/72  Pulse: 97 97  96  Resp: (!) 22 (!) 41  (!) 40  Temp:   98.5 F (36.9 C)   TempSrc:   Axillary   SpO2: 96% 99%  99%  Weight:      Height:      On my evaluation she was quite lethargic, difficult to arouse, quickly back to sleep.  Coarse bilateral breath sounds.  No other new findings on exam  I have reviewed her case with Dr. Megan Salon with infectious diseases.  We agree that her presentation, evolving data is inconsistent with COVID-19.  She presented with flank pain, and evaluation consistent with pyelonephritis due to renal calculus.  She has an E. coli UTI and bacteremia for which she is being treated.  At this point I think it is very reasonable to discontinue her COVID-19 low risk droplet isolation as we wait for her testing to return.  Her chest x-ray from this afternoon shows an evolving left lower lobe infiltrate, air bronchograms.  Question atelectasis versus new pneumonia.  I suspect that she will require airway protection and mechanical ventilation.  Independent CC time 40 minutes  Baltazar Apo, MD, PhD 09/25/2018, 4:23 PM [facility] [phone] or if no answer [phone]

## 2018-09-25 NOTE — Progress Notes (Signed)
Pharmacy Antibiotic Note  [name] is a 63 y.o. female transferred to [facility] on 09/23/2018 with septic shock.  Per MD note, patient has pyelonephritis, E.coli UTI and bacteremia, and evolving LLL infiltrate concerning for PNA.  Pharmacy has been consulted to broaden antibiotics to Zosyn.  Renal function is worsening but urine output is good.  Afebrile, WBC elevated and trended up to 30.5.  Plan: Zosyn 2.25gm IV Q6H, infuse each dose over 30 min Monitor renal fxn, UOP, clinical progress   Height: 4\' 11"  (149.9 cm) Weight: 169 lb 8.5 oz (76.9 kg) IBW/kg (Calculated) : 43.2  Temp (24hrs), Avg:97.9 F (36.6 C), Min:96.4 F (35.8 C), Max:98.5 F (36.9 C)  Recent Labs  Lab 09/23/18 0408 09/23/18 0523 09/23/18 0717 09/24/18 0431 09/24/18 1732 09/24/18 2256 09/25/18 0417 09/25/18 1350 09/25/18 1453  WBC 23.2*  --   --  24.7*  --   --  30.5*  --   --   CREATININE 3.26*  --   --  2.81* 2.91*  --  2.99*  --  3.13*  LATICACIDVEN  --  6.1* 6.5*  --   --  2.1*  --  2.0*  --     Estimated Creatinine Clearance: 16.5 mL/min (A) (by C-G formula based on SCr of 3.13 mg/dL (H)).    Allergies  Allergen Reactions  . Other Nausea And Vomiting and Swelling    Shrimp if eat a lot  . Cleocin [Clindamycin Hcl] Other (See Comments)    "irritated my esophagus"  . Morphine And Related Nausea And Vomiting  . Pneumococcal Vaccines Other (See Comments)    Really high fever, and flu symptoms. MD instructed not to give  . Buprenorphine Hcl Nausea And Vomiting     OSH Bcx: GNR 3/22 Blood - NGTD 3/22 MRSA - NEG  Evert Wenrich D. Mina Marble, PharmD, BCPS, [place] 09/25/2018, 4:57 PM

## 2018-09-25 NOTE — Progress Notes (Addendum)
14:00- Patient self removed foley while balloon inflated, removed her clothing and trashing in the bed, patient verbally redirectable once nurses in the room, urethra yielded scant blood, foley replaced with Crystal Rice RN, once patient settled back into bed she became lethargic and only aroused by increased stimulus. NP notified of change in mental status, both NP and MD came bedside and orders placed.

## 2018-09-25 NOTE — Procedures (Signed)
Intubation Procedure Note [name] [id number] August 15, 1955  Procedure: Intubation Indications: Airway protection and maintenance  Procedure Details Consent: Risks of procedure as well as the alternatives and risks of each were explained to the (patient/caregiver).  Consent for procedure obtained. Time Out: Verified patient identification, verified procedure, site/side was marked, verified correct patient position, special equipment/implants available, medications/allergies/relevent history reviewed, required imaging and test results available.  Performed  Maximum sterile technique was used including gloves, gown, hand hygiene and mask.  MAC and 3    Evaluation Hemodynamic Status: Transient hypotension resolved spontaneously; O2 sats: transiently fell during during procedure Patient's Current Condition: stable Complications: No apparent complications Patient did tolerate procedure well. Chest X-ray ordered to verify placement.  CXR: pending.  Intubation was performed under direct supervision of Dr. Baltazar Apo. Pt. Was pre-oxygenated with saturations of 100% prior to Etomidate administration.She was given Etomidate 40 mg IV. There was direct visualization of the cords using Glide scope technology. Patient tolerated procedure well.  I called and spoke with the patient's husband prior to the procedure. He states it was her desire to be intubated. He gave verbal permission for the procedure. He understood this was emergent.  Code status is full code.   Magdalen Spatz 09/25/2018

## 2018-09-25 NOTE — Progress Notes (Signed)
Critical ABG values given to S. Elie Confer, NP.

## 2018-09-25 NOTE — Progress Notes (Signed)
CRITICAL VALUE STICKER  CRITICAL VALUE: lactic acid 2.0  MD NOTIFIED: [place]

## 2018-09-25 NOTE — Progress Notes (Addendum)
NAME:  DERIONNA SALVADOR, MRN:  [id number], DOB:  June 08, 1956, LOS: 2 ADMISSION DATE:  09/23/2018, CONSULTATION DATE:  09/23/2018 REFERRING MD:  None , CHIEF COMPLAINT:  Septic shock   History of present illness   Ms. Natalya Domzalski is a 63 year old female with history of type 2 diabetes mellitus, heart failure with reduced ejection fraction, atrial fibrillation on anticoagulation with Eliquis who presents as a transfer from [place] for higher level of care for septic shock.  Admitted with fever, right hydronephrosis/pyelonephritis and septic shock with gram-negative sepsis   While there there was also concern for coronavirus infection.  She had a flu swab done that was negative.  The concern was due to her fever.  And recently she was at a wedding with 20 people.    Labs at the OSH on 3/21 WBC 14.2 Hgb 9.1 Plts 110 D dimer 4728 ABG 3/21 at 2304 hrs PH 7.23 PCO2 25 PO2 71 Na 136 K 3.9 Cl 107 CO2 13 AG 20 BUN 33 Crt 2.7  LA 5.2-->5.5 Procalcitonin >200 COVID 19 Pending  C.diff Positive by PCR Blood culture-Gram negative rods  Flu swab-Negative   Last CXR 3/22 Bilateral lower lobe linear peribronchial airspace consolidation which may represent bronchitis or early bronchopneumonia.  Past Medical History  -Type II DM -HFrEF -A-fib on anticoagulation  Significant Hospital Events   -Transferred to [facility] from OSH  Consults:  -Interventional radiology  Procedures:  -None   Significant Diagnostic Tests:  CT abdomen and pelvis 3/20 1-71mm right UPJ stone with moderate right hydronephrosis and prominent right perinephrid edema 34mm non obstructing stone upper pole right kidney  Abdominal Ultrasound  Probable fatty infiltration of liver  Moderate hydronephrosis   Echo 08/2018  1. The left ventricle has decreased systolic function with an ejection fraction of 40-45%. The cavity size was normal. There is moderate to severe concentric left ventricular hypertrophy.  2. The right  ventricle has normal systolic function. The cavity was normal. There is no increase in right ventricular wall thickness.  3. The tricuspid valve is normal in structure.  4. The pulmonic valve was normal in structure.  5. Right atrial pressure is estimated at 3 mmHg.  6. The aortic root, ascending aorta and aortic arch are normal in size and structure.  7. The aortic valve is tricuspid.  8. The mitral valve is normal in structure with mild MR  Repeat renal ultrasound 3/22>> resolved hydronephrosis  Micro Data:  -Blood culture from [place]-Gram negative rods >> E Coli, pan sensitive -Stool-Positive for C.diff  Antimicrobials:  -PO vanc 3/20 >> -Zosyn 3/20 >> 3/24 - Ancef 3/24>>   Subjective / Interval events: Less flank pain Pressors off  UO last 24 1300 cc Net negative 900 cc last 24, Net positive since admission 1800 cc Tolerating PO Sleepy  Objective   Blood pressure 140/79, pulse 95, temperature (!) 96.4 F (35.8 C), temperature source Axillary, resp. rate (!) 21, height 4\' 11"  (1.499 m), weight 76.9 kg, SpO2 97 %.        Intake/Output Summary (Last 24 hours) at 09/25/2018 0951 Last data filed at 09/25/2018 0900 Gross per 24 hour  Intake 437.72 ml  Output 1146 ml  Net -708.28 ml   Filed Weights   09/23/18 0116 09/24/18 0347 09/25/18 0500  Weight: 72.4 kg 76.9 kg 76.9 kg   Examination:  Asleep in bed on RA, NAD Awake, alert, sleepy, interacting, follows commands, moves all extremities NCAT, Oropharynx clear, no icterus, pupils equal S1, S2,  RRR, NO RMG Bilateral chest excursion, clear and diminished per bases Abdomen soft, nondistended and nontender. Obese,  Positive bowel sounds 1+ lower extremity edema, cap refill < 3 seconds, warm to touch Foley in place, rectal tube in place, green drainage noted    Assessment & Plan:  #Septic shock #Metabolic acidosis #Gram negative rod bacteremia  3/21 BC from OSH positive for E Coli  3/21 Urine Culture from OSH  positive for E Coli Patient with septic shock due to gram negative rod bacteremia from urinary source. She has right sided kidney stones causing moderate hydronephrosis.  Plans: Will switch to Ancef per culture resuts and sensitivities on 3/24 Blood  Culture has resulted, awaiting urine culture results Appreciate IR and urology assistance.   No clear indication for percutaneous drain given resolution of her hydronephrosis.   Continue to follow micro Re-culture as is clinically indicated Continue stress dose steroids for now Pressors for short time early am 3/24 Follow lactate for clearance  #C.diff colitis Had diarrhea on presentation and C.diff test was positive. Plans: Continue Enteral vancomycin Continue Enteric precautions as ordered Continue Enteric precautions Trend fecal output volume  ? Early broncho pneumonia WBC up trending Plan Trend CXR Aggressive Pulmonary Toilet  #Acute kidney Injury, improving 3/23 #Obstructive uropathy AKI is likely multifactorial partly due to obstructive uropathy from kidney stones and partly prerenal due to shock. Her baseline creatinine is 1-1.3. Continued slow rise in creatinine Plans: Bicarbonate drip discontinued Follow renal function, urine output Avoid nephrotoxins Medications renally dosed  #Hypomagnesemia  Hypocalcemia Plans: Replete calcium 3/24 Trend BMET Replete as needed  Severe thrombocytopenia.   Suspect DIC- Negative Suspect related to sepsis D-dimer elevated Fibrinogen elevated, PT elevated, APTT high normal Plan Trend CBC Transfuse for HGB < 7  #Atrial fibrillation  Patient with history of a-fib on carvedilol and fleicanide at home. She is also anticoagulated on eliquis. She was on a heparin gtt at the outside hospital.  Per patient last dose was given at [facility] on Saturday ST per tele 3/24 Plans: Carvedilol, Entresto, diuretics on hold Flecainide as ordered Eliquis on hold for now  #Chronic systolic  Heart failure Pressors off < 12 hours -Hold entresto, carvedilol and diuretics -Will resume as appropriate once shock resolves  #Concern for COVID 19 Patient is very low risk. Her low grade fever is due to GNR bacteremia. Test already obtained at OSH Plans: Follow rest results, monitor and isolate as low risk status  [facility], Urine Culture is being faxed, COVID 19 has not resulted   Best practice:  Diet: clears  Pain/Anxiety/Delirium protocol (if indicated): xanax VAP protocol (if indicated): N/A DVT prophylaxis:SCD GI prophylaxis: Continue home PPI  Glucose control: Slidiing scale to maintain euglycemia  Mobility: PT and OT once appropriate Code Status: Full code  Family Communication: Family have not called for update 3/24 Disposition: ICU    Independent CC time 35 minutes   Magdalen Spatz, AGACNP-BC 09/25/2018, 9:51 AM [place] [phone] or if no answer [phone]

## 2018-09-25 NOTE — Progress Notes (Signed)
SLP Cancellation Note  Patient Details Name: [name] MRN: [id number] DOB: 16-Sep-1955   Cancelled treatment:       Reason Eval/Treat Not Completed: Other (comment) Per health system leadership, therapy services are being held at this time until patient tests negative for COVID-19.  Discussed with RN.    [name], MA CCC-SLP  Acute Rehabilitation Services Pager [phone] Office [phone]  [name] 09/25/2018, 9:34 AM

## 2018-09-26 ENCOUNTER — Inpatient Hospital Stay (HOSPITAL_COMMUNITY): Payer: Medicare Other

## 2018-09-26 DIAGNOSIS — N1330 Unspecified hydronephrosis: Secondary | ICD-10-CM

## 2018-09-26 DIAGNOSIS — J9690 Respiratory failure, unspecified, unspecified whether with hypoxia or hypercapnia: Secondary | ICD-10-CM

## 2018-09-26 LAB — CBC
HCT: 27.4 % — ABNORMAL LOW (ref 36.0–46.0)
Hemoglobin: 8.3 g/dL — ABNORMAL LOW (ref 12.0–15.0)
MCH: 29.2 pg (ref 26.0–34.0)
MCHC: 30.3 g/dL (ref 30.0–36.0)
MCV: 96.5 fL (ref 80.0–100.0)
Platelets: 67 10*3/uL — ABNORMAL LOW (ref 150–400)
RBC: 2.84 MIL/uL — ABNORMAL LOW (ref 3.87–5.11)
RDW: 16.5 % — ABNORMAL HIGH (ref 11.5–15.5)
WBC: 51 10*3/uL (ref 4.0–10.5)
nRBC: 1.1 % — ABNORMAL HIGH (ref 0.0–0.2)

## 2018-09-26 LAB — GLUCOSE, CAPILLARY
GLUCOSE-CAPILLARY: 148 mg/dL — AB (ref 70–99)
Glucose-Capillary: 152 mg/dL — ABNORMAL HIGH (ref 70–99)
Glucose-Capillary: 161 mg/dL — ABNORMAL HIGH (ref 70–99)
Glucose-Capillary: 167 mg/dL — ABNORMAL HIGH (ref 70–99)
Glucose-Capillary: 172 mg/dL — ABNORMAL HIGH (ref 70–99)

## 2018-09-26 LAB — COMPREHENSIVE METABOLIC PANEL
ALT: 549 U/L — ABNORMAL HIGH (ref 0–44)
AST: 496 U/L — AB (ref 15–41)
Albumin: 2.3 g/dL — ABNORMAL LOW (ref 3.5–5.0)
Alkaline Phosphatase: 200 U/L — ABNORMAL HIGH (ref 38–126)
Anion gap: 18 — ABNORMAL HIGH (ref 5–15)
BUN: 73 mg/dL — AB (ref 8–23)
CO2: 18 mmol/L — ABNORMAL LOW (ref 22–32)
Calcium: 7.1 mg/dL — ABNORMAL LOW (ref 8.9–10.3)
Chloride: 104 mmol/L (ref 98–111)
Creatinine, Ser: 3.32 mg/dL — ABNORMAL HIGH (ref 0.44–1.00)
GFR calc Af Amer: 16 mL/min — ABNORMAL LOW (ref >60)
GFR calc non Af Amer: 14 mL/min — ABNORMAL LOW (ref >60)
Glucose, Bld: 163 mg/dL — ABNORMAL HIGH (ref 70–99)
POTASSIUM: 3.7 mmol/L (ref 3.5–5.1)
Sodium: 140 mmol/L (ref 135–145)
Total Bilirubin: 2.7 mg/dL — ABNORMAL HIGH (ref 0.3–1.2)
Total Protein: 5.9 g/dL — ABNORMAL LOW (ref 6.5–8.1)

## 2018-09-26 LAB — POCT I-STAT 7, (LYTES, BLD GAS, ICA,H+H)
Acid-base deficit: 7 mmol/L — ABNORMAL HIGH (ref 0.0–2.0)
Acid-base deficit: 6 mmol/L — ABNORMAL HIGH (ref 0.0–2.0)
Bicarbonate: 21.1 mmol/L (ref 20.0–28.0)
Bicarbonate: 20.6 mmol/L (ref 20.0–28.0)
CALCIUM ION: 1.03 mmol/L — AB (ref 1.15–1.40)
Calcium, Ion: 1.07 mmol/L — ABNORMAL LOW (ref 1.15–1.40)
HCT: 29 % — ABNORMAL LOW (ref 36.0–46.0)
HCT: 26 % — ABNORMAL LOW (ref 36.0–46.0)
Hemoglobin: 9.9 g/dL — ABNORMAL LOW (ref 12.0–15.0)
Hemoglobin: 8.8 g/dL — ABNORMAL LOW (ref 12.0–15.0)
O2 Saturation: 95 %
O2 Saturation: 98 %
Patient temperature: 98.3
Patient temperature: 98.3
Potassium: 3.5 mmol/L (ref 3.5–5.1)
Potassium: 3.5 mmol/L (ref 3.5–5.1)
Sodium: 142 mmol/L (ref 135–145)
Sodium: 142 mmol/L (ref 135–145)
TCO2: 23 mmol/L (ref 22–32)
TCO2: 22 mmol/L (ref 22–32)
pCO2 arterial: 56 mmHg — ABNORMAL HIGH (ref 32.0–48.0)
pCO2 arterial: 42 mmHg (ref 32.0–48.0)
pH, Arterial: 7.183 — CL (ref 7.350–7.450)
pH, Arterial: 7.297 — ABNORMAL LOW (ref 7.350–7.450)
pO2, Arterial: 92 mmHg (ref 83.0–108.0)
pO2, Arterial: 123 mmHg — ABNORMAL HIGH (ref 83.0–108.0)

## 2018-09-26 LAB — HEPATIC FUNCTION PANEL
ALT: 498 U/L — ABNORMAL HIGH (ref 0–44)
AST: 459 U/L — AB (ref 15–41)
Albumin: 2.1 g/dL — ABNORMAL LOW (ref 3.5–5.0)
Alkaline Phosphatase: 194 U/L — ABNORMAL HIGH (ref 38–126)
BILIRUBIN DIRECT: 1.4 mg/dL — AB (ref 0.0–0.2)
Indirect Bilirubin: 1.1 mg/dL — ABNORMAL HIGH (ref 0.3–0.9)
Total Bilirubin: 2.5 mg/dL — ABNORMAL HIGH (ref 0.3–1.2)
Total Protein: 6.3 g/dL — ABNORMAL LOW (ref 6.5–8.1)

## 2018-09-26 LAB — DIC (DISSEMINATED INTRAVASCULAR COAGULATION)PANEL
D-Dimer, Quant: 11.06 ug/mL-FEU — ABNORMAL HIGH (ref 0.00–0.50)
Fibrinogen: 673 mg/dL — ABNORMAL HIGH (ref 210–475)
INR: 1.7 — ABNORMAL HIGH (ref 0.8–1.2)
Platelets: 71 10*3/uL — ABNORMAL LOW (ref 150–400)
Smear Review: NONE SEEN
aPTT: 30 seconds (ref 24–36)

## 2018-09-26 LAB — DIC (DISSEMINATED INTRAVASCULAR COAGULATION) PANEL: PROTHROMBIN TIME: 19.9 s — AB (ref 11.4–15.2)

## 2018-09-26 LAB — AMMONIA: Ammonia: 32 umol/L (ref 9–35)

## 2018-09-26 LAB — MAGNESIUM: Magnesium: 2.6 mg/dL — ABNORMAL HIGH (ref 1.7–2.4)

## 2018-09-26 MED ORDER — AMIODARONE HCL IN DEXTROSE 360-4.14 MG/200ML-% IV SOLN
60.0000 mg/h | INTRAVENOUS | Status: DC
  Filled 2018-09-26: qty 200

## 2018-09-26 MED ORDER — HYDROCORTISONE NA SUCCINATE PF 100 MG IJ SOLR
50.0000 mg | Freq: Four times a day (QID) | INTRAMUSCULAR | Status: DC
  Administered 2018-09-26 – 2018-09-28 (×7): 50 mg via INTRAVENOUS
  Filled 2018-09-26 (×7): qty 2

## 2018-09-26 MED ORDER — INSULIN ASPART 100 UNIT/ML ~~LOC~~ SOLN
0.0000 [IU] | SUBCUTANEOUS | Status: DC
  Administered 2018-09-26 (×2): 1 [IU] via SUBCUTANEOUS
  Administered 2018-09-27 (×3): 2 [IU] via SUBCUTANEOUS
  Administered 2018-09-27: 3 [IU] via SUBCUTANEOUS
  Administered 2018-09-27 (×2): 1 [IU] via SUBCUTANEOUS
  Administered 2018-09-28: 5 [IU] via SUBCUTANEOUS
  Administered 2018-09-28: 3 [IU] via SUBCUTANEOUS
  Administered 2018-09-28: 7 [IU] via SUBCUTANEOUS

## 2018-09-26 MED ORDER — NOREPINEPHRINE 16 MG/250ML-% IV SOLN
5.0000 ug/min | INTRAVENOUS | Status: DC
  Administered 2018-09-26: 12 ug/min via INTRAVENOUS
  Administered 2018-09-27: 5 ug/min via INTRAVENOUS
  Administered 2018-09-27: 3 ug/min via INTRAVENOUS
  Administered 2018-09-27: 2 ug/min via INTRAVENOUS
  Filled 2018-09-26: qty 250

## 2018-09-26 MED ORDER — FAMOTIDINE 20 MG IN NS 100 ML IVPB: 20.0000 mg | INTRAVENOUS | Status: DC

## 2018-09-26 MED ORDER — FAMOTIDINE 40 MG/5ML PO SUSR
20.0000 mg | ORAL | Status: DC
  Administered 2018-09-26 – 2018-09-27 (×2): 20 mg
  Filled 2018-09-26 (×2): qty 2.5

## 2018-09-26 MED ORDER — VANCOMYCIN 50 MG/ML ORAL SOLUTION
500.0000 mg | Freq: Four times a day (QID) | ORAL | Status: DC
  Administered 2018-09-26 – 2018-09-27 (×4): 500 mg
  Filled 2018-09-26 (×5): qty 10

## 2018-09-26 MED ORDER — AMIODARONE HCL IN DEXTROSE 360-4.14 MG/200ML-% IV SOLN
30.0000 mg/h | INTRAVENOUS | Status: DC
  Filled 2018-09-26: qty 200

## 2018-09-26 MED ORDER — SODIUM CHLORIDE 0.9 % IV SOLN
2.0000 g | INTRAVENOUS | Status: DC
  Administered 2018-09-26 – 2018-09-29 (×4): 2 g via INTRAVENOUS
  Filled 2018-09-26 (×5): qty 20

## 2018-09-26 NOTE — Progress Notes (Signed)
Initial Nutrition Assessment RD working remotely.  DOCUMENTATION CODES:   Obesity unspecified  INTERVENTION:   If unable to extubate within the next 24 hours, recommend start TF:  Vital High Protein at 45 ml/h (1080 ml per day)  Provides 1080 kcal, 95 gm protein, 903 ml free water daily  NUTRITION DIAGNOSIS:   Inadequate oral intake related to inability to eat as evidenced by NPO status.  GOAL:   Provide needs based on ASPEN/SCCM guidelines  MONITOR:   Vent status, TF tolerance, Labs  REASON FOR ASSESSMENT:   Ventilator    ASSESSMENT:   63 yo female with PMH of A fib, asthma, GERD, HLD, CHF, DM-2, breast cancer, and recurrent UTI's who was admitted with septic shock, E coli UTI, suspected renal calculus, GNR bacteremia, and C diff colitis. Required intubation 3/24 afternoon.    Spoke with RN today. Do not expect patient will be extubated today. OGT in place.  Patient is currently intubated on ventilator support MV: 5.9 L/min Temp (24hrs), Avg:98.5 F (36.9 C), Min:98.3 F (36.8 C), Max:98.6 F (37 C)   Labs reviewed. BUN 73 (H), creatinine 3.32 (H), LFTs elevated Medications reviewed and include pepcid, solucortef, novolog, levophed, fentanyl.     NUTRITION - FOCUSED PHYSICAL EXAM:  unable to complete   Diet Order:   Diet Order            Diet NPO time specified  Diet effective now              EDUCATION NEEDS:   No education needs have been identified at this time  Skin:  Skin Assessment: Reviewed RN Assessment  Last BM:  3/24 (type 7)  Height:   Ht Readings from Last 1 Encounters:  09/23/18 4\' 11"  (1.499 m)    Weight:   Wt Readings from Last 1 Encounters:  09/26/18 75.8 kg    Ideal Body Weight:  44.5 kg  BMI:  Body mass index is 33.75 kg/m.  Estimated Nutritional Needs:   Kcal:  [phone]  Protein:  89 gm  Fluid:  1.5 L    Molli Barrows, RD, LDN, CNSC Pager [phone] After Hours Pager [phone]

## 2018-09-26 NOTE — Progress Notes (Signed)
CRITICAL VALUE ALERT  Critical Value: WBC- 51.0  Provider Notified: Elink provider @ [phone]  RN at bedside, will continue to monitor.

## 2018-09-26 NOTE — Progress Notes (Signed)
SLP Cancellation Note  Patient Details Name: [name] MRN: [id number] DOB: 1955-11-10   Cancelled treatment:       Reason Eval/Treat Not Completed: Medical issues which prohibited therapy. Pt now of isolation precaution for low risk Covid-19 but test result still pending. Pt now intubated due to decline. Will discontinue orders and await new orders as pt progresses.    Trameka Dorough, Katherene Ponto 09/26/2018, 7:52 AM

## 2018-09-26 NOTE — Progress Notes (Signed)
NAME:  [name], MRN:  [id number], DOB:  1955-07-06, LOS: 3 ADMISSION DATE:  09/23/2018, CONSULTATION DATE:  09/23/2018 REFERRING MD:  None , CHIEF COMPLAINT:  Septic shock   History of present illness   [name] is a 63 year old female with history of N5AO, systolic HF, AF on Eliquis who presents as a transfer from [place] for higher level of care for septic shock.  Admitted with fever, right hydronephrosis/pyelonephritis and septic shock with gram-negative sepsis.    There was some concern for coronavirus infection as she was recently at a wedding with 20 people (unknown if any positive exposures).  She had a flu swab done that was negative.  Covid tested at [facility].  However overt sepsis attributed to GNR sepsis.  Past Medical History  Type II DM HFrEF A-fib on anticoagulation  Significant Hospital Events   3/22 Transferred to [facility] from [place] 3/24 Intubated  Consults:  urology  Procedures:  3/21 R IJ CVC >> 3/22 R radial Aline >> 3/24 ETT >> 3/24 foley >>  Significant Diagnostic Tests:  CT abdomen and pelvis 3/20 1-28mm right UPJ stone with moderate right hydronephrosis and prominent right perinephrid edema 65mm non obstructing stone upper pole right kidney  Abdominal Ultrasound  Probable fatty infiltration of liver  Moderate hydronephrosis   Echo 08/2018  1. The left ventricle has decreased systolic function with an ejection fraction of 40-45%. The cavity size was normal. There is moderate to severe concentric left ventricular hypertrophy.  2. The right ventricle has normal systolic function. The cavity was normal. There is no increase in right ventricular wall thickness.  3. The tricuspid valve is normal in structure.  4. The pulmonic valve was normal in structure.  5. Right atrial pressure is estimated at 3 mmHg.  6. The aortic root, ascending aorta and aortic arch are normal in size and structure.  7. The aortic valve is tricuspid.  8. The  mitral valve is normal in structure with mild MR  Repeat renal ultrasound 3/22>> resolved hydronephrosis  Micro Data:  -Blood culture from [place]-Gram negative rods >> E Coli, pan sensitive - UC from [place] - E. Coli - pan sensitive -Stool cdiff at [facility] - PCR +, Antigen +, toxin neg  - tested for Covid at [facility] [place] ER >>  3/22 BC x 2 >> 3/22 MRSA PCR >>  Antimicrobials:  PO vanc 3/20 >>  Zosyn 3/20 >> 3/24 3/25 Ceftriaxone >>   Subjective/ Interval Events:  Remains on levophed 12 mcg/min  fentanyl gtt at 400 mcg/min- RN reports patient becomes wild and bites ETT at lower doses Afebrile Slightly worse renal function Minimal stool output Back in Afib  WBC 30-> 51K CVP 14  Objective   Blood pressure 100/68, pulse (!) 126, temperature 98.3 F (36.8 C), temperature source Oral, resp. rate 18, height 4\' 11"  (1.499 m), weight 75.8 kg, SpO2 97 %. CVP:  [10 mmHg] 10 mmHg  Vent Mode: PRVC FiO2 (%):  [40 %-100 %] 40 % Set Rate:  [18 bmp] 18 bmp Vt Set:  [340 mL] 340 mL PEEP:  [5 cmH20] 5 cmH20 Plateau Pressure:  [16 cmH20-19 cmH20] 17 cmH20   Intake/Output Summary (Last 24 hours) at 09/26/2018 1122 Last data filed at 09/26/2018 1100 Gross per 24 hour  Intake 1525.82 ml  Output 1165 ml  Net 360.82 ml   Filed Weights   09/24/18 0347 09/25/18 0500 09/26/18 0500  Weight: 76.9 kg 76.9 kg 75.8 kg   Examination: General:  Critically ill adult female on MV in NAD HEENT: MM pink/moist, ETT/ OGT, pupils 4/reactive, minimal icterus  Neuro: sedate on fentanyl, occasional will startle and open eyes, MAE, does not f/c CV: IRIR, no mumur PULM: even/non-labored on MV, lungs bilaterally clear anteriorly, diminished in bases GI: obese, soft, +bs, minimal flexiseal output Extremities: warm/dry, no edema  Skin: no rashes   Assessment & Plan:  Septic shock from GNR bacteremia - 3/21 BC/ UC from [facility] positive for E Coli  - Patient with septic shock due to gram negative rod  bacteremia from urinary source. She has right sided kidney stones causing moderate hydronephrosis, since resolved on renal US 3/22 - seen by Urology 3/22- no need for nephrostomy tube Plans: BC/ UC from [facility] pan-sensitive, given LLL infiltrate, will continue w/ ceftriaxone  Continue stress dose steroids for now Continue levophed for goal MAP > 65 CVP q 4 Follow culture data here Repeat CT abd/ pelvis  Acute hypoxic respiratory failure R/o LLL atelectasis vs infiltrate/ aspiration  - CXR 3/25- stable tubes, ongoing LLL opacity- atelectasis +/- inflitrate; small bilateral pleural effusions P:  Continue full MV support PRVC Trend CXR ABG now VAP measures  PAD protocol with fentanyl gtt/ versed prn   R/o C.diff colitis- less likely given clinically criteria  - Stool cdiff at [facility] - PCR +, Antigen +, toxin neg ad.  Had diarrhea on presentation, but since has had minimal stool output - no evidence of bowel inflammation on CT abd/ pelvis 3/20 Plans: Continue Enteral vancomycin for now  Continue Enteric precautions  Trend fecal output volume  Acute kidney Injury Obstructive uropathy- resolved AKI is likely multifactorial partly due to obstructive uropathy from kidney stones and partly prerenal due to shock. Her baseline creatinine is 1-1.3. Continued slow rise in creatinine Plans: Follow renal function, urine output Avoid nephrotoxins Medications renally dosed  Hypomagnesemia - resolved Hypocalcemia Plans: Trend UOP/ renal panel   Severe thrombocytopenia.   Suspect DIC- initial test neg Suspect related to sepsis Recheck DIC panel Trend CBC- Hgb trend stable Transfuse for HGB < 7  #Atrial fibrillation  Patient with history of a-fib on carvedilol, fleicanide, and eliquis at home. She was on a heparin gtt at the outside hospital.  Per patient last dose was given at [facility] on Saturday ST per tele 3/24-  Now in Afib 3/25 Plans: Carvedilol, Entresto, diuretics on hold D/c  flecainide, start amio gtt per protocol, no bolus  No anticoagulation for now given severe thrombocytopenia   #Chronic systolic Heart failure Pressors off < 12 hours Hold entresto, carvedilol and diuretics Will resume as appropriate once shock resolves  #Concern for COVID 19 Patient is very low risk. Her low grade fever is due to GNR bacteremia. Test already obtained at [facility] Plans: Follow rest results, monitor  Isolation d/c 3/24  DM P:  CBG q 4 SSI  Transaminitis  - ?shock liver P:  CT abd/ pelvis ordered tend LFTs, ammonia Trend coags  Best practice:  Diet: NPO Pain/Anxiety/Delirium protocol (if indicated): fentanyl /versed VAP protocol (if indicated): yes DVT prophylaxis:SCD GI prophylaxis: change PPI to pepcid given thrombocytopenia  Glucose control: SSI Mobility: PT and OT once appropriate Code Status: Full code  Family Communication: Will call husband to update Disposition: ICU    Independent CC time 1 mins  Kennieth Rad, MSN, AGACNP-BC [facility] Pgr: [phone] or if no answer [phone] 09/26/2018, 12:24 PM

## 2018-09-26 NOTE — Progress Notes (Signed)
Pt transported to CT on vent and returned to 8E03 without complications.

## 2018-09-26 NOTE — Progress Notes (Signed)
[facility] Progress Note Patient Name: [name] DOB: 07-13-1955 MRN: [id number]   Date of Service  09/26/2018  HPI/Events of Note  WBC count up to 51 K but serum lactate is 2.0 making an acute surgical abdomen less likely. Etiology unclear but may represent Leukemoid Rxn  eICU Interventions  None at this time        Frederik Pear 09/26/2018, 5:45 AM

## 2018-09-26 NOTE — Progress Notes (Signed)
Critical ABG values given to B. Simpson, NP.  RR changed to 24 per verbal order.  ABG to follow in 1 hour.

## 2018-09-26 NOTE — TOC Initial Note (Signed)
Transition of Care [facility]) - Initial/Assessment Note    Patient Details  Name: [name] MRN: [id number] Date of Birth: 1955/11/03  [facility] CM/SW Contact:    Maryclare Labrador, RN Phone Number: 09/26/2018, 3:37 PM  Clinical Narrative:   PTA independent from home with spouse.  Pt remains intubated - CM discussed PTA status of pt with husband - pt has PCP, no medication barriers and no transportation barriers.  CM will to follow                  Expected Discharge Plan: Home/Self Care(from home with husband) Barriers to Discharge: Continued Medical Work up   Patient Goals and CMS Choice Patient states their goals for this hospitalization and ongoing recovery are:: (Pt intubated)      Expected Discharge Plan and Services Expected Discharge Plan: Home/Self Care(from home with husband)       Living arrangements for the past 2 months: Single Family Home                          Prior Living Arrangements/Services Living arrangements for the past 2 months: Single Family Home Lives with:: Spouse          Need for Family Participation in Patient Care: Yes (Comment) Care giver support system in place?: Yes (comment)   Criminal Activity/Legal Involvement Pertinent to Current Situation/Hospitalization: No - Comment as needed  Activities of Daily Living Home Assistive Devices/Equipment: Cane (specify quad or straight)(staight) ADL Screening (condition at time of admission) Patient's cognitive ability adequate to safely complete daily activities?: Yes Is the patient deaf or have difficulty hearing?: No Does the patient have difficulty seeing, even when wearing glasses/contacts?: No Does the patient have difficulty concentrating, remembering, or making decisions?: No Patient able to express need for assistance with ADLs?: Yes Does the patient have difficulty dressing or bathing?: No Independently performs ADLs?: Yes (appropriate for developmental  age) Does the patient have difficulty walking or climbing stairs?: Yes Weakness of Legs: Both Weakness of Arms/Hands: None  Permission Sought/Granted Permission sought to share information with : Case Manager                Emotional Assessment           Psych Involvement: No (comment)  Admission diagnosis:  Septic shock ([place]) [A41.9, R65.21] Bacteremia due to Gram-negative bacteria [R78.81] Patient Active Problem List   Diagnosis Date Noted  . AMS (altered mental status)   . Septic shock ([place]) 09/23/2018  . DOE (dyspnea on exertion) 07/27/2018  . Anemia, normocytic normochromic 07/27/2018  . Cough variant asthma vs uacs  07/24/2017  . IDA (iron deficiency anemia) 04/19/2017  . Restrictive lung disease 01/18/2017  . Simple chronic bronchitis ([place]) 11/15/2016  . Asthma 11/15/2016  . Chronic non-seasonal allergic rhinitis 11/15/2016  . Hyperlipidemia 11/15/2016  . Nephrolithiasis 11/15/2016  . GERD (gastroesophageal reflux disease) 11/15/2016  . Diarrhea 11/15/2016  . Atrial fibrillation ([place]) 11/15/2016  . Left breast mass 07/19/2013  . History of breast cancer in female 07/19/2013  . Type II or unspecified type diabetes mellitus without mention of complication, not stated as uncontrolled 05/11/2013  . HTN (hypertension) 05/08/2013  . Breast cancer ([place])    PCP:  Enid Skeens., MD Pharmacy:   [id number] - [place], [place] - [address] [facility] [place] [address] Phone: [phone] Fax: [phone]     Social Determinants of Health (SDOH) Interventions  Readmission Risk Interventions Readmission Risk Prevention Plan 09/26/2018  Transportation Screening (No Data)  Some recent data might be hidden

## 2018-09-26 NOTE — Progress Notes (Signed)
  Amiodarone Drug - Drug Interaction Consult Note  Recommendations:  N/A  Amiodarone is metabolized by the cytochrome P450 system and therefore has the potential to cause many drug interactions. Amiodarone has an average plasma half-life of 50 days (range 20 to 100 days).   There is potential for drug interactions to occur several weeks or months after stopping treatment and the onset of drug interactions may be slow after initiating amiodarone.   [x]  Statins: Increased risk of myopathy. Simvastatin- restrict dose to 20mg  daily. Other statins: counsel patients to report any muscle pain or weakness immediately.  []  Anticoagulants: Amiodarone can increase anticoagulant effect. Consider warfarin dose reduction. Patients should be monitored closely and the dose of anticoagulant altered accordingly, remembering that amiodarone levels take several weeks to stabilize.  []  Antiepileptics: Amiodarone can increase plasma concentration of phenytoin, the dose should be reduced. Note that small changes in phenytoin dose can result in large changes in levels. Monitor patient and counsel on signs of toxicity.  []  Beta blockers: increased risk of bradycardia, AV block and myocardial depression. Sotalol - avoid concomitant use.  []   Calcium channel blockers (diltiazem and verapamil): increased risk of bradycardia, AV block and myocardial depression.  []   Cyclosporine: Amiodarone increases levels of cyclosporine. Reduced dose of cyclosporine is recommended.  []  Digoxin dose should be halved when amiodarone is started.  []  Diuretics: increased risk of cardiotoxicity if hypokalemia occurs.  []  Oral hypoglycemic agents (glyburide, glipizide, glimepiride): increased risk of hypoglycemia. Patient's glucose levels should be monitored closely when initiating amiodarone therapy.   []  Drugs that prolong the QT interval:  Torsades de pointes risk may be increased with concurrent use - avoid if possible.  Monitor QTc,  also keep magnesium/potassium WNL if concurrent therapy can't be avoided. Marland Kitchen Antibiotics: e.g. fluoroquinolones, erythromycin. . Antiarrhythmics: e.g. quinidine, procainamide, disopyramide, sotalol. . Antipsychotics: e.g. phenothiazines, haloperidol.  . Lithium, tricyclic antidepressants, and methadone. Thank You,  Remigio Eisenmenger D. Mina Marble, PharmD, BCPS, [place] 09/26/2018, 2:29 PM

## 2018-09-27 ENCOUNTER — Inpatient Hospital Stay (HOSPITAL_COMMUNITY): Payer: Medicare Other

## 2018-09-27 DIAGNOSIS — R400 Somnolence: Secondary | ICD-10-CM

## 2018-09-27 LAB — GLUCOSE, CAPILLARY
GLUCOSE-CAPILLARY: 149 mg/dL — AB (ref 70–99)
GLUCOSE-CAPILLARY: 192 mg/dL — AB (ref 70–99)
Glucose-Capillary: 149 mg/dL — ABNORMAL HIGH (ref 70–99)
Glucose-Capillary: 154 mg/dL — ABNORMAL HIGH (ref 70–99)
Glucose-Capillary: 156 mg/dL — ABNORMAL HIGH (ref 70–99)
Glucose-Capillary: 153 mg/dL — ABNORMAL HIGH (ref 70–99)
Glucose-Capillary: 219 mg/dL — ABNORMAL HIGH (ref 70–99)
Glucose-Capillary: 245 mg/dL — ABNORMAL HIGH (ref 70–99)

## 2018-09-27 LAB — POCT I-STAT 7, (LYTES, BLD GAS, ICA,H+H)
Acid-base deficit: 3 mmol/L — ABNORMAL HIGH (ref 0.0–2.0)
Bicarbonate: 21.9 mmol/L (ref 20.0–28.0)
Calcium, Ion: 1.07 mmol/L — ABNORMAL LOW (ref 1.15–1.40)
HCT: 28 % — ABNORMAL LOW (ref 36.0–46.0)
Hemoglobin: 9.5 g/dL — ABNORMAL LOW (ref 12.0–15.0)
O2 Saturation: 100 %
Patient temperature: 98
Potassium: 3.4 mmol/L — ABNORMAL LOW (ref 3.5–5.1)
Sodium: 141 mmol/L (ref 135–145)
TCO2: 23 mmol/L (ref 22–32)
pCO2 arterial: 37.9 mmHg (ref 32.0–48.0)
pH, Arterial: 7.368 (ref 7.350–7.450)
pO2, Arterial: 299 mmHg — ABNORMAL HIGH (ref 83.0–108.0)

## 2018-09-27 LAB — RENAL FUNCTION PANEL
Albumin: 2 g/dL — ABNORMAL LOW (ref 3.5–5.0)
Anion gap: 16 — ABNORMAL HIGH (ref 5–15)
BUN: 81 mg/dL — AB (ref 8–23)
CO2: 19 mmol/L — ABNORMAL LOW (ref 22–32)
Calcium: 7.8 mg/dL — ABNORMAL LOW (ref 8.9–10.3)
Chloride: 105 mmol/L (ref 98–111)
Creatinine, Ser: 3.22 mg/dL — ABNORMAL HIGH (ref 0.44–1.00)
GFR calc Af Amer: 17 mL/min — ABNORMAL LOW (ref >60)
GFR calc non Af Amer: 15 mL/min — ABNORMAL LOW (ref >60)
Glucose, Bld: 158 mg/dL — ABNORMAL HIGH (ref 70–99)
Phosphorus: 5.9 mg/dL — ABNORMAL HIGH (ref 2.5–4.6)
Potassium: 3.1 mmol/L — ABNORMAL LOW (ref 3.5–5.1)
Sodium: 140 mmol/L (ref 135–145)

## 2018-09-27 LAB — HEPATIC FUNCTION PANEL
ALT: 384 U/L — ABNORMAL HIGH (ref 0–44)
AST: 226 U/L — ABNORMAL HIGH (ref 15–41)
Albumin: 2 g/dL — ABNORMAL LOW (ref 3.5–5.0)
Alkaline Phosphatase: 177 U/L — ABNORMAL HIGH (ref 38–126)
Bilirubin, Direct: 1.5 mg/dL — ABNORMAL HIGH (ref 0.0–0.2)
Indirect Bilirubin: 1.3 mg/dL — ABNORMAL HIGH (ref 0.3–0.9)
Total Bilirubin: 2.8 mg/dL — ABNORMAL HIGH (ref 0.3–1.2)
Total Protein: 5.7 g/dL — ABNORMAL LOW (ref 6.5–8.1)

## 2018-09-27 LAB — CBC
HCT: 25 % — ABNORMAL LOW (ref 36.0–46.0)
Hemoglobin: 7.6 g/dL — ABNORMAL LOW (ref 12.0–15.0)
MCH: 28.7 pg (ref 26.0–34.0)
MCHC: 30.4 g/dL (ref 30.0–36.0)
MCV: 94.3 fL (ref 80.0–100.0)
Platelets: 63 10*3/uL — ABNORMAL LOW (ref 150–400)
RBC: 2.65 MIL/uL — ABNORMAL LOW (ref 3.87–5.11)
RDW: 16.5 % — ABNORMAL HIGH (ref 11.5–15.5)
WBC: 29.3 10*3/uL — ABNORMAL HIGH (ref 4.0–10.5)
nRBC: 1.4 % — ABNORMAL HIGH (ref 0.0–0.2)

## 2018-09-27 LAB — MAGNESIUM: Magnesium: 2.5 mg/dL — ABNORMAL HIGH (ref 1.7–2.4)

## 2018-09-27 LAB — C DIFFICILE QUICK SCREEN W PCR REFLEX
C Diff antigen: NEGATIVE
C Diff interpretation: NOT DETECTED
C Diff toxin: NEGATIVE

## 2018-09-27 LAB — AMMONIA: Ammonia: 29 umol/L (ref 9–35)

## 2018-09-27 MED ORDER — FENTANYL CITRATE (PF) 100 MCG/2ML IJ SOLN: 50.0000 ug | INTRAMUSCULAR | Status: DC | PRN

## 2018-09-27 MED ORDER — POTASSIUM CHLORIDE 20 MEQ/15ML (10%) PO SOLN
ORAL | Status: AC
  Filled 2018-09-27: qty 30

## 2018-09-27 MED ORDER — POTASSIUM CHLORIDE 20 MEQ/15ML (10%) PO SOLN
40.0000 meq | ORAL | Status: AC
  Administered 2018-09-27 (×2): 40 meq
  Filled 2018-09-27: qty 30

## 2018-09-27 MED ORDER — VITAL HIGH PROTEIN PO LIQD
1000.0000 mL | ORAL | Status: DC
  Administered 2018-09-27 – 2018-09-28 (×2): 1000 mL

## 2018-09-27 MED ORDER — POTASSIUM CHLORIDE 20 MEQ PO PACK: 40.0000 meq | PACK | ORAL | Status: DC

## 2018-09-27 MED ORDER — DEXMEDETOMIDINE HCL IN NACL 400 MCG/100ML IV SOLN
0.0000 ug/kg/h | INTRAVENOUS | Status: DC
  Administered 2018-09-27 – 2018-09-28 (×3): 0.4 ug/kg/h via INTRAVENOUS
  Filled 2018-09-27 (×4): qty 100

## 2018-09-27 NOTE — Progress Notes (Signed)
Subjective: Patient remains in tubated. Events of last 2-3 days noted. Agree CT scan was necessary which confirms she passed the stone and no significant hydronephrosis. I reviewed the CT images and discussed with Dr. Lamonte Sakai.   Objective: Vital signs in last 24 hours: Temp:  [97.9 F (36.6 C)-98.7 F (37.1 C)] 98.7 F (37.1 C) (03/26 0749) Pulse Rate:  [55-133] 85 (03/26 0813) Resp:  [18-24] 24 (03/26 0813) BP: (87-157)/(55-89) 123/75 (03/26 0813) SpO2:  [95 %-100 %] 96 % (03/26 0813) Arterial Line BP: (52-150)/(44-81) 107/52 (03/26 0600) FiO2 (%):  [40 %] 40 % (03/26 0813) Weight:  [79.5 kg] 79.5 kg (03/26 0500)  Intake/Output from previous day: 03/25 0701 - 03/26 0700 In: 1382 [I.V.:1282.1; IV Piggyback:99.9] Out: 1545 [Urine:1530; Stool:15] Intake/Output this shift: No intake/output data recorded.  Physical Exam:  Intubated Abd - soft, non-distended Ext - scds in place GU - urine clear   Lab Results: Recent Labs    09/26/18 1337 09/27/18 0323 09/27/18 0446  HGB 8.8* 9.5* 7.6*  HCT 26.0* 28.0* 25.0*   BMET Recent Labs    09/26/18 0432  09/27/18 0323 09/27/18 0446  NA 140   < > 141 140  K 3.7   < > 3.4* 3.1*  CL 104  --   --  105  CO2 18*  --   --  19*  GLUCOSE 163*  --   --  158*  BUN 73*  --   --  81*  CREATININE 3.32*  --   --  3.22*  CALCIUM 7.1*  --   --  7.8*   < > = values in this interval not displayed.   Recent Labs    09/24/18 1732 09/26/18 1244  INR 1.7* 1.7*   No results for input(s): LABURIN in the last 72 hours. Results for orders placed or performed during the hospital encounter of 09/23/18  MRSA PCR Screening     Status: None   Collection Time: 09/23/18  1:24 AM  Result Value Ref Range Status   MRSA by PCR NEGATIVE NEGATIVE Final    Comment:        The GeneXpert MRSA Assay (FDA approved for NASAL specimens only), is one component of a comprehensive MRSA colonization surveillance program. It is not intended to diagnose  MRSA infection nor to guide or monitor treatment for MRSA infections. Performed at [facility], [place] [address] [place], [place] [address]   Culture, blood (routine x 2)     Status: None (Preliminary result)   Collection Time: 09/23/18  6:03 AM  Result Value Ref Range Status   Specimen Description BLOOD RIGHT ANTECUBITAL  Final   Special Requests   Final    BOTTLES DRAWN AEROBIC ONLY Blood Culture adequate volume   Culture   Final    NO GROWTH 3 DAYS Performed at [facility], [place] [address], [place], [place] [address]    Report Status PENDING  Incomplete    Studies/Results: Ct Abdomen Pelvis Wo Contrast  Result Date: 09/26/2018 CLINICAL DATA:  Sepsis EXAM: CT ABDOMEN AND PELVIS WITHOUT CONTRAST TECHNIQUE: Multidetector CT imaging of the abdomen and pelvis was performed following the standard protocol without IV contrast. COMPARISON:  09/21/2018 FINDINGS: Lower chest: Small bilateral pleural effusions. Bibasilar airspace disease. Hepatobiliary: No focal liver abnormality is seen. Contrast fills the entirety of the gallbladder. No gallstones, gallbladder wall thickening, or biliary dilatation. Small amount of perihepatic ascites. Pancreas: Unremarkable. No pancreatic ductal dilatation or surrounding inflammatory changes. Spleen:  Normal in size without focal abnormality. Adrenals/Urinary Tract: Adrenal glands are unremarkable. Mild right hydronephrosis. 1-2 mm nonobstructing right renal calculus. Previously seen right UPJ calculus is not seen on the current exam. No other ureteral calculi. No bladder calculi. Foley catheter within the bladder. Stomach/Bowel: Stomach is within normal limits. No evidence of bowel wall thickening, distention, or inflammatory changes. Rectal drainage tube is present. Vascular/Lymphatic: Abdominal aortic atherosclerosis. No lymphadenopathy. Reproductive: Uterus and bilateral adnexa are unremarkable. Other: Mild anasarca. Small amount pelvic free fluid. No  drainable fluid collection. Musculoskeletal: No acute osseous abnormality. No aggressive osseous lesion. Posterior lumbar fusion and decompression from T10 through S1. Mild osteoarthritis of bilateral sacroiliac joints. IMPRESSION: 1. Mild right hydronephrosis. 1-2 mm nonobstructing right renal calculus. Previously seen right UPJ calculus is not seen on the current exam. No other ureteral calculi. 2. Mild anasarca 3. Small bilateral pleural effusions. Bibasilar airspace disease which may reflect atelectasis versus pneumonia. Electronically Signed   By: Kathreen Devoid   On: 09/26/2018 18:56   Dg Abd 1 View  Result Date: 09/25/2018 CLINICAL DATA:  63 year old female status post intubation for airway protection. Acute renal failure. EXAM: ABDOMEN - 1 VIEW COMPARISON:  Portable chest 1700 hours today. CT Abdomen and Pelvis 09/21/2018. FINDINGS: Portable AP semi upright view at 1715 hours. Enteric tube courses to the left abdomen with side hole at the level of the gastric body. Stable confluent opacity at the left lung base. Paucity of bowel gas. Calcified aortic atherosclerosis. Extensive thoracic and lumbar posterior spinal fusion hardware again noted. IMPRESSION: Enteric tube placed, side hole at the level of the gastric body. Paucity of bowel gas. Electronically Signed   By: Genevie Ann M.D.   On: 09/25/2018 19:59   Dg Chest Port 1 View  Result Date: 09/26/2018 CLINICAL DATA:  Hypoxia EXAM: PORTABLE CHEST 1 VIEW COMPARISON:  September 25, 2018 FINDINGS: Endotracheal tube tip is 1.9 cm above the carina. Nasogastric tube tip and side port below the diaphragm. Central catheter tip is at cavoatrial junction. No pneumothorax. There is persistent airspace consolidation in the left lower lobe. There are small pleural effusions bilaterally. There is no consolidation on the right. Heart size and pulmonary vascularity within normal limits. No adenopathy. There is aortic atherosclerosis. There is postoperative change in the lower  thoracic and visualized lumbar regions. There are surgical clips in the left breast region. IMPRESSION: Tube and catheter positions as described without evident pneumothorax. Consolidation left lower lobe, likely due to a combination of atelectasis and pneumonia or possibly aspiration. There are small pleural effusions bilaterally. Stable cardiac silhouette. Aortic Atherosclerosis (ICD10-I70.0). Appearance stable compared to 1 day prior. Electronically Signed   By: Lowella Grip III M.D.   On: 09/26/2018 07:46   Dg Chest Port 1 View  Result Date: 09/25/2018 CLINICAL DATA:  Respiratory failure requiring intubation. EXAM: PORTABLE CHEST 1 VIEW COMPARISON:  Radiograph of same day. FINDINGS: Stable cardiomediastinal silhouette. Atherosclerosis of thoracic aorta is noted. Endotracheal tube is seen projected over tracheal air shadow with distal tip 1 cm above the carina. Nasogastric tube is seen entering stomach. Right internal jugular catheter is noted with distal tip in expected position of cavoatrial junction. No pneumothorax is noted. Mild left pleural effusion is noted with associated atelectasis or infiltrate. Minimal right basilar subsegmental atelectasis is noted. Bony thorax is unremarkable. IMPRESSION: Endotracheal and nasogastric tubes are in grossly good position. Mild left pleural effusion is noted with associated atelectasis or infiltrate. Mild right basilar subsegmental atelectasis. Right internal jugular  catheter in grossly good position. Electronically Signed   By: Marijo Conception, M.D.   On: 09/25/2018 18:36   Dg Chest Port 1 View  Result Date: 09/25/2018 CLINICAL DATA:  Altered mental status. EXAM: PORTABLE CHEST 1 VIEW COMPARISON:  09/23/2018 FINDINGS: Mild enlargement of the cardiopericardial silhouette, stable. No mediastinal or hilar masses. There is opacity in the left lung base obscuring hemidiaphragm and partly silhouetting the left heart border, associated with air bronchograms.  There is bilateral interstitial thickening greater in the lower lungs, superimposed on chronically prominent bronchovascular markings. Hazy opacities noted at the right lung base consistent with a small pleural effusion. Probable left pleural effusion. No pneumothorax. Right internal jugular central venous line is stable from the prior exam. IMPRESSION: 1. Worsened lung aeration compared to the prior study. There is increased opacity at the left lung base consistent with atelectasis or pneumonia. There also small pleural effusions as well as lower lung zone interstitial thickening. A component of congestive heart failure with interstitial edema is suspected. 2. Mild stable cardiomegaly. Electronically Signed   By: Lajean Manes M.D.   On: 09/25/2018 16:11    Assessment/Plan: -sepsis - e coli UTI and bacteremia - per primary team   -right ureteral stone - repeat CT confirms stone passage. There is no significant hydronephrosis remaining and no peri-renal fluid or abscess.   I will sign off. Please page GU with any questions, concerns or changes in patient status. She can f/u with Dr. Amalia Hailey at [facility] or I am happy to see her at [facility].    LOS: 4 days   Festus Aloe 09/27/2018, 8:14 AM

## 2018-09-27 NOTE — Progress Notes (Addendum)
NAME:  [name], MRN:  [id number], DOB:  [birth year], LOS: 4 ADMISSION DATE:  09/23/2018, CONSULTATION DATE:  09/23/2018 REFERRING MD:  None , CHIEF COMPLAINT:  Septic shock   History of present illness   [name] is a 63 year old female with history of Y8MV, systolic HF, AF on Eliquis who presents as a transfer from [place] for higher level of care for septic shock.  Admitted with fever, right hydronephrosis/pyelonephritis and septic shock with gram-negative sepsis.    There was some concern for coronavirus infection as she was recently at a wedding with 20 people (unknown if any positive exposures).  She had a flu swab done that was negative.  Covid tested at [facility].  However overt sepsis attributed to GNR sepsis.  Past Medical History  Type II DM HFrEF A-fib on anticoagulation  Significant Hospital Events   3/22 Transferred to [facility] from [place] 3/24 Intubated  Consults:  urology  Procedures:  3/21 R IJ CVC >> 3/22 R radial Aline >> 3/24 ETT >> 3/24 foley >>  Significant Diagnostic Tests:  CT abdomen and pelvis 3/20 1-29mm right UPJ stone with moderate right hydronephrosis and prominent right perinephrid edema 36mm non obstructing stone upper pole right kidney  Abdominal Ultrasound  Probable fatty infiltration of liver  Moderate hydronephrosis   Echo 08/2018  1. The left ventricle has decreased systolic function with an ejection fraction of 40-45%. The cavity size was normal. There is moderate to severe concentric left ventricular hypertrophy.  2. The right ventricle has normal systolic function. The cavity was normal. There is no increase in right ventricular wall thickness.  3. The tricuspid valve is normal in structure.  4. The pulmonic valve was normal in structure.  5. Right atrial pressure is estimated at 3 mmHg.  6. The aortic root, ascending aorta and aortic arch are normal in size and structure.  7. The aortic valve is tricuspid.  8. The  mitral valve is normal in structure with mild MR  Repeat renal ultrasound 3/22>> resolved hydronephrosis  CT abd / pelvis 3/25 >> no evidence of bowel wall thickening, distention, inflammatory changes.  Stomach normal.  Mild right hydronephrosis with a 1 to 2 mm nonobstructing right renal stone.  The previously seen UPJ calculus is no longer present  Micro Data:  -Blood culture from [place]-Gram negative rods >> E Coli, pan sensitive - UC from [place] - E. Coli - pan sensitive -Stool cdiff at [facility] - PCR +, Antigen +, toxin neg  - tested for Covid at [facility] >>  3/22 BC x 2 >> 3/22 MRSA PCR >> negative  Antimicrobials:  PO vanc 3/20 >>  Zosyn 3/20 >> 3/24 3/25 Ceftriaxone >>   Subjective/ Interval Events:  Patient was on high-dose fentanyl just post intubation.  Heavily sedated this morning and all sedation is now been held. Norepinephrine has been weaned to off Slight improvement renal function Minimal stool output  Objective   Blood pressure 123/75, pulse 85, temperature 98.7 F (37.1 C), temperature source Axillary, resp. rate (!) 24, height 4\' 11"  (1.499 m), weight 79.5 kg, SpO2 96 %. CVP:  [10 mmHg-13 mmHg] 10 mmHg  Vent Mode: PRVC FiO2 (%):  [40 %] 40 % Set Rate:  [18 bmp-24 bmp] 24 bmp Vt Set:  [340 mL] 340 mL PEEP:  [5 cmH20] 5 cmH20 Plateau Pressure:  [16 cmH20-18 cmH20] 18 cmH20   Intake/Output Summary (Last 24 hours) at 09/27/2018 1008 Last data filed at 09/27/2018 0924 Gross per  24 hour  Intake 1577.56 ml  Output 1670 ml  Net -92.44 ml   Filed Weights   09/25/18 0500 09/26/18 0500 09/27/18 0500  Weight: 76.9 kg 75.8 kg 79.5 kg   Examination: General: Critically ill woman, intubated in no distress HEENT: ET tube in position, G-tube in position, some scleral icterus present Neuro: Opens eyes and moves bilateral upper extremities with stimulation.  Did not track, did not follow commands CV: Irregularly irregular, no murmur PULM: Diminished both  bases, more so on the left.  No wheezing, no crackles GI: Obese, soft, positive bowel sounds, some minimal green/brown stool in Flexi-Seal Extremities: No significant edema Skin: No rash  Assessment & Plan:  Septic shock from GNR bacteremia - 3/21 BC/ UC from [facility] positive for E Coli  - Patient with septic shock due to gram negative rod bacteremia from urinary source. She has right sided kidney stones causing moderate hydronephrosis, since resolved on renal US 3/22 - seen by Urology 3/22- no need for nephrostomy tube Plans: Continue current ceftriaxone as ordered Appreciate urology assistance.  Repeat CT abdomen without any evidence of recurrent obstruction, no role for stenting or percutaneous drainage Wean norepinephrine to off as able Remains on stress dose hydrocort, consider wean to off if she remains hemodynamically stable.  Follow CVP Follow culture data  Acute hypoxic respiratory failure R/o LLL atelectasis vs infiltrate/ aspiration  - CXR 3/25- stable tubes, ongoing LLL opacity- atelectasis +/- inflitrate; small bilateral pleural effusions P:  MV support PRVC 8 cc/kg Wean PEEP, FiO2 as able Follow chest x-ray VAP prevention orders Initiate SBT's soon  R/o C.diff colitis- less likely given clinically criteria  - Stool cdiff at [facility] - PCR +, Antigen +, toxin neg ad.  Had diarrhea on presentation, but since has had minimal stool output - no evidence of bowel inflammation on CT abd/ pelvis 3/20 Plans: Plan to recheck C. difficile toxin on 3/26.  If negative then I will discontinue enteric precautions and stop the vancomycin.  Acute kidney Injury Obstructive uropathy- resolved AKI is likely multifactorial partly due to obstructive uropathy from kidney stones and partly prerenal due to shock. Her baseline creatinine is 1-1.3. Plans: Continue to follow BMP, UOP Maintain adequate perfusion  Hypomagnesemia - resolved Hypocalcemia Hypokalemia Plans: Replace electrolytes  as indicated  Severe thrombocytopenia.   Suspect DIC- initial test neg Schistocytes neg 3/25, improving slightly Repeat DIC panel inconsistent with DIC Follow CBC  Atrial fibrillation  Patient with history of a-fib on carvedilol, fleicanide, and eliquis at home. She was on a heparin gtt at the outside hospital.  Per patient last dose was [facility] at [facility] on Saturday Plans: Labetalol, Entresto, diuretics are all on hold, restart when able Flecainide held, currently on amiodarone Anticoagulation held in setting of severe thrombocytopenia  Chronic systolic Heart failure Hold entresto, carvedilol and diuretics Will resume as appropriate once shock resolves  Concern for COVID 19 Patient is very low risk. Her low grade fever is due to E. coli bacteremia. Test already obtained at OSH Plans: Continue to follow final lab result for novel coronavirus COVID-19 Isolation d/c 3/24  DM P:  Sliding-scale insulin as ordered  Transaminitis, likely shock liver.  Improving No evidence for cholestasis or gallbladder disease on CT abdomen P:  Continue to follow LFT, coags.  Improving Hold statin temporarily   Best practice:  Diet: NPO Pain/Anxiety/Delirium protocol (if indicated): fentanyl /versed VAP protocol (if indicated): yes DVT prophylaxis:SCD GI prophylaxis: change PPI to pepcid given thrombocytopenia  Glucose control:  SSI Mobility: PT and OT once appropriate Code Status: Full code  Family Communication: Will call husband to update Disposition: ICU    Independent CC time 78 mins   Baltazar Apo, MD, PhD 09/27/2018, 10:33 AM [facility] [phone] or if no answer [phone]

## 2018-09-28 LAB — CBC WITH DIFFERENTIAL/PLATELET
Band Neutrophils: 0 %
Basophils Absolute: 0 10*3/uL (ref 0.0–0.1)
Basophils Relative: 0 %
Blasts: 0 %
EOS PCT: 0 %
Eosinophils Absolute: 0 10*3/uL (ref 0.0–0.5)
HCT: 25.2 % — ABNORMAL LOW (ref 36.0–46.0)
Hemoglobin: 7.9 g/dL — ABNORMAL LOW (ref 12.0–15.0)
Lymphocytes Relative: 4 %
Lymphs Abs: 1.1 10*3/uL (ref 0.7–4.0)
MCH: 29 pg (ref 26.0–34.0)
MCHC: 31.3 g/dL (ref 30.0–36.0)
MCV: 92.6 fL (ref 80.0–100.0)
Metamyelocytes Relative: 0 %
Monocytes Absolute: 1.1 10*3/uL — ABNORMAL HIGH (ref 0.1–1.0)
Monocytes Relative: 4 %
Myelocytes: 0 %
Neutro Abs: 26.4 10*3/uL — ABNORMAL HIGH (ref 1.7–7.7)
Neutrophils Relative %: 92 %
OTHER: 0 %
Platelets: 67 10*3/uL — ABNORMAL LOW (ref 150–400)
Promyelocytes Relative: 0 %
RBC: 2.72 MIL/uL — ABNORMAL LOW (ref 3.87–5.11)
RDW: 16.9 % — ABNORMAL HIGH (ref 11.5–15.5)
WBC Morphology: INCREASED
WBC: 28.6 10*3/uL — ABNORMAL HIGH (ref 4.0–10.5)
nRBC: 0 /100 WBC
nRBC: 1.4 % — ABNORMAL HIGH (ref 0.0–0.2)

## 2018-09-28 LAB — BASIC METABOLIC PANEL
Anion gap: 14 (ref 5–15)
BUN: 93 mg/dL — ABNORMAL HIGH (ref 8–23)
CO2: 23 mmol/L (ref 22–32)
Calcium: 8.6 mg/dL — ABNORMAL LOW (ref 8.9–10.3)
Chloride: 106 mmol/L (ref 98–111)
Creatinine, Ser: 2.95 mg/dL — ABNORMAL HIGH (ref 0.44–1.00)
GFR calc Af Amer: 19 mL/min — ABNORMAL LOW (ref >60)
GFR calc non Af Amer: 16 mL/min — ABNORMAL LOW (ref >60)
Glucose, Bld: 304 mg/dL — ABNORMAL HIGH (ref 70–99)
Potassium: 3.8 mmol/L (ref 3.5–5.1)
Sodium: 143 mmol/L (ref 135–145)

## 2018-09-28 LAB — CULTURE, BLOOD (ROUTINE X 2)
Culture: NO GROWTH
Special Requests: ADEQUATE

## 2018-09-28 LAB — GLUCOSE, CAPILLARY
Glucose-Capillary: 256 mg/dL — ABNORMAL HIGH (ref 70–99)
Glucose-Capillary: 340 mg/dL — ABNORMAL HIGH (ref 70–99)
Glucose-Capillary: 340 mg/dL — ABNORMAL HIGH (ref 70–99)
Glucose-Capillary: 382 mg/dL — ABNORMAL HIGH (ref 70–99)
Glucose-Capillary: 402 mg/dL — ABNORMAL HIGH (ref 70–99)
Glucose-Capillary: 413 mg/dL — ABNORMAL HIGH (ref 70–99)

## 2018-09-28 MED ORDER — HYDROCORTISONE NA SUCCINATE PF 100 MG IJ SOLR
50.0000 mg | Freq: Two times a day (BID) | INTRAMUSCULAR | Status: DC
  Administered 2018-09-28 – 2018-09-30 (×4): 50 mg via INTRAVENOUS
  Filled 2018-09-28 (×4): qty 2

## 2018-09-28 MED ORDER — PANTOPRAZOLE SODIUM 40 MG PO PACK
40.0000 mg | PACK | Freq: Every day | ORAL | Status: DC
  Administered 2018-09-28 – 2018-10-03 (×4): 40 mg
  Filled 2018-09-28 (×6): qty 20

## 2018-09-28 MED ORDER — INSULIN ASPART 100 UNIT/ML ~~LOC~~ SOLN
0.0000 [IU] | SUBCUTANEOUS | Status: DC
  Administered 2018-09-28 (×2): 15 [IU] via SUBCUTANEOUS

## 2018-09-28 MED ORDER — INSULIN ASPART 100 UNIT/ML ~~LOC~~ SOLN
0.0000 [IU] | SUBCUTANEOUS | Status: DC
  Administered 2018-09-28: 20 [IU] via SUBCUTANEOUS
  Administered 2018-09-29: 7 [IU] via SUBCUTANEOUS
  Administered 2018-09-29: 11 [IU] via SUBCUTANEOUS
  Administered 2018-09-29: 4 [IU] via SUBCUTANEOUS
  Administered 2018-09-29: 15 [IU] via SUBCUTANEOUS
  Administered 2018-09-29: 0 [IU] via SUBCUTANEOUS
  Administered 2018-09-29: 15 [IU] via SUBCUTANEOUS
  Administered 2018-09-29: 7 [IU] via SUBCUTANEOUS
  Administered 2018-09-30: 4 [IU] via SUBCUTANEOUS
  Administered 2018-09-30: 3 [IU] via SUBCUTANEOUS
  Administered 2018-09-30: 7 [IU] via SUBCUTANEOUS
  Administered 2018-09-30 (×2): 4 [IU] via SUBCUTANEOUS
  Administered 2018-09-30: 7 [IU] via SUBCUTANEOUS
  Administered 2018-10-01: 4 [IU] via SUBCUTANEOUS
  Administered 2018-10-01: 7 [IU] via SUBCUTANEOUS
  Administered 2018-10-01: 6 [IU] via SUBCUTANEOUS
  Administered 2018-10-01: 15 [IU] via SUBCUTANEOUS
  Administered 2018-10-01: 11 [IU] via SUBCUTANEOUS
  Administered 2018-10-02: 7 [IU] via SUBCUTANEOUS
  Administered 2018-10-02: 3 [IU] via SUBCUTANEOUS
  Administered 2018-10-02 (×3): 7 [IU] via SUBCUTANEOUS
  Administered 2018-10-03: 21:00:00 3 [IU] via SUBCUTANEOUS
  Administered 2018-10-03: 7 [IU] via SUBCUTANEOUS
  Administered 2018-10-03 (×2): 3 [IU] via SUBCUTANEOUS
  Administered 2018-10-03: 05:00:00 4 [IU] via SUBCUTANEOUS
  Administered 2018-10-04: 7 [IU] via SUBCUTANEOUS
  Administered 2018-10-04: 4 [IU] via SUBCUTANEOUS

## 2018-09-28 MED ORDER — INSULIN ASPART 100 UNIT/ML ~~LOC~~ SOLN
3.0000 [IU] | SUBCUTANEOUS | Status: DC
  Administered 2018-09-28 – 2018-10-04 (×21): 3 [IU] via SUBCUTANEOUS

## 2018-09-28 MED ORDER — INSULIN GLARGINE 100 UNIT/ML ~~LOC~~ SOLN
15.0000 [IU] | Freq: Every day | SUBCUTANEOUS | Status: DC
  Administered 2018-09-28 – 2018-09-30 (×3): 15 [IU] via SUBCUTANEOUS
  Filled 2018-09-28 (×4): qty 0.15

## 2018-09-28 NOTE — Progress Notes (Signed)
NAME:  [name], MRN:  [id number], DOB:  1955/12/03, LOS: 5 ADMISSION DATE:  09/23/2018, CONSULTATION DATE:  09/23/2018 REFERRING MD:  None , CHIEF COMPLAINT:  Septic shock   History of present illness   [name] is a 63 year old female with history of W0JW, systolic HF, AF on Eliquis who presents as a transfer from [place] for higher level of care for septic shock.  Admitted with fever, right hydronephrosis/pyelonephritis and septic shock with gram-negative sepsis.    There was some concern for coronavirus infection as she was recently at a wedding with 20 people (unknown if any positive exposures).  She had a flu swab done that was negative.  Covid tested [facility].  However overt sepsis attributed to GNR sepsis.  Past Medical History  Type II DM HFrEF A-fib on anticoagulation  Significant Hospital Events   3/22 Transferred to [facility] from [place] 3/24 Intubated  Consults:  urology  Procedures:  3/21 R IJ CVC >> 3/22 R radial Aline >> 3/24 ETT >> 3/24 foley >>  Significant Diagnostic Tests:  CT abdomen and pelvis 3/20 1-38mm right UPJ stone with moderate right hydronephrosis and prominent right perinephrid edema 76mm non obstructing stone upper pole right kidney  Abdominal Ultrasound  Probable fatty infiltration of liver  Moderate hydronephrosis   Echo 08/2018  1. The left ventricle has decreased systolic function with an ejection fraction of 40-45%. The cavity size was normal. There is moderate to severe concentric left ventricular hypertrophy.  2. The right ventricle has normal systolic function. The cavity was normal. There is no increase in right ventricular wall thickness.  3. The tricuspid valve is normal in structure.  4. The pulmonic valve was normal in structure.  5. Right atrial pressure is estimated at 3 mmHg.  6. The aortic root, ascending aorta and aortic arch are normal in size and structure.  7. The aortic valve is tricuspid.  8. The  mitral valve is normal in structure with mild MR  Repeat renal ultrasound 3/22>> resolved hydronephrosis  CT abd / pelvis 3/25 >> no evidence of bowel wall thickening, distention, inflammatory changes.  Stomach normal.  Mild right hydronephrosis with a 1 to 2 mm nonobstructing right renal stone.  The previously seen UPJ calculus is no longer present  Micro Data:  -Blood culture from [place]-Gram negative rods >> E Coli, pan sensitive - UC from [place] - E. Coli - pan sensitive -Stool cdiff at [facility] - PCR +, Antigen +, toxin neg  - tested for Covid at [facility] >>  3/22 BC x 2 >> negative 3/22 MRSA PCR >> negative  Antimicrobials:  PO vanc 3/20 >> 3/26 Zosyn 3/20 >> 3/24 3/25 Ceftriaxone >>   Subjective/ Interval Events:  Tolerating PSVT this morning Serum creatinine improving slightly, better urine output last 2 days Liver function testing improving as well Repeat C. difficile testing was negative, isolation discontinued and enteral vancomycin stopped  Objective   Blood pressure 118/84, pulse 78, temperature 98.5 F (36.9 C), temperature source Oral, resp. rate 14, height 4\' 11"  (1.499 m), weight 78.5 kg, SpO2 99 %. CVP:  [9 mmHg-10 mmHg] 10 mmHg  Vent Mode: CPAP;PSV FiO2 (%):  [40 %] 40 % Set Rate:  [24 bmp] 24 bmp Vt Set:  [340 mL] 340 mL PEEP:  [5 cmH20] 5 cmH20 Pressure Support:  [8 cmH20-10 cmH20] 8 cmH20 Plateau Pressure:  [15 cmH20-18 cmH20] 18 cmH20   Intake/Output Summary (Last 24 hours) at 09/28/2018 1204 Last data  filed at 09/28/2018 1000 Gross per 24 hour  Intake 1730.6 ml  Output 1452 ml  Net 278.6 ml   Filed Weights   09/26/18 0500 09/27/18 0500 09/28/18 0413  Weight: 75.8 kg 79.5 kg 78.5 kg   Examination: General: Ill-appearing woman, ventilated, no distress HEENT: ET tube in place, G-tube in place, mild scleral icterus Neuro: More awake today, opens eyes to voice, did follow commands and move extremities spontaneously, still quickly back to  sleep CV: Regularly irregular, no murmur PULM: Diminished at both bases, no wheeze, no crackles GI: Obese, soft, nondistended with positive bowel sounds Extremities: No significant lower extremity edema Skin: No rash  Assessment & Plan:  Septic shock from GNR bacteremia, hemodynamically improved - 3/21 BC/ UC from [facility] positive for E Coli  - Patient with septic shock due to gram negative rod bacteremia from urinary source. She has right sided kidney stones causing moderate hydronephrosis, since resolved on renal US 3/22 - seen by Urology 3/22- no need for nephrostomy tube Plans: Continue current ceftriaxone as ordered, this is day 7 total antibiotics but note also treating left lower lobe cure associated pneumonia Appreciate urology assistance, repeat CT scan of the abdomen without any evidence of obstruction.  Stenting, percutaneous drainage deferred Begin to wean stress dose steroids 3/27  Acute hypoxic respiratory failure R/o LLL atelectasis vs infiltrate/ aspiration  - CXR 3/25- stable tubes, ongoing LLL opacity- atelectasis +/- inflitrate; small bilateral pleural effusions P:  Continue PRVC 8 cc/kg Tolerating pressure support ventilation, may be able to work towards extubation when mental status allows Likely need to extend the antibiotic course to at least 3/29 to cover left lower lobe pneumonia Follow intermittent chest x-ray  R/o C.diff colitis-follow-up testing negative - Stool cdiff at [facility] - PCR +, Antigen +, toxin neg ad.  Had diarrhea on presentation, but since has had minimal stool output - no evidence of bowel inflammation on CT abd/ pelvis 3/20 -Follow-up C. difficile testing -3/26 Plans: Contact isolation discontinued Enteral vancomycin stopped on 3/26  Acute kidney Injury, improving Obstructive uropathy- resolved AKI is likely multifactorial partly due to obstructive uropathy from kidney stones and partly prerenal due to shock. Her baseline creatinine is  1-1.3. Plans: Continue to follow urine output, BMP Avoid nephrotoxins  Hypomagnesemia - resolved Hypocalcemia Hypokalemia Plans: Place electrolytes as indicated  Severe thrombocytopenia.   Suspect DIC- initial test neg Follow CBC, platelets remain stable in the 60s  Atrial fibrillation  Patient with history of a-fib on carvedilol, fleicanide, and eliquis at home. She was on a heparin gtt at the outside hospital.  Per patient last dose was given at [facility] on Saturday Plans: Labetalol, Entresto, diuretics are all on hold, restart when able Home flecainide is on hold, plan to reinitiate soon.  Amiodarone has been stopped Anticoagulation on hold given the thrombocytopenia  Chronic systolic Heart failure Holding Entresto, carvedilol and diuretics until hemodynamically stable Reinitiate as blood pressure will allow  Concern for COVID 19 Patient is very low risk. Her low grade fever was due to E. coli bacteremia. Test already obtained at O[facility] Plans: Continue to follow final lab result for novel coronavirus COVID-19 Isolation was stopped on 3/24  DM P:  Change sliding scale insulin to standard scale, add Lantus 3/27 Home Januvia, metformin, glipizide on hold  Transaminitis, likely shock liver.  Improving No evidence for cholestasis or gallbladder disease on CT abdomen P:  Follow LFT Statin temporarily on hold   Best practice:  Diet: NPO Pain/Anxiety/Delirium protocol (if  indicated): fentanyl /versed VAP protocol (if indicated): yes DVT prophylaxis:SCD GI prophylaxis: PPI Glucose control: SSI Mobility: PT and OT once appropriate Code Status: Full code  Family Communication: Will call husband to update Disposition: ICU    Independent CC time 75 mins   Baltazar Apo, MD, PhD 09/28/2018, 12:04 PM  Pulmonary and Critical Care [phone] or if no answer [phone]

## 2018-09-28 NOTE — Progress Notes (Addendum)
Inpatient Diabetes Program Recommendations  AACE/ADA: New Consensus Statement on Inpatient Glycemic Control (2015)  Target Ranges:  Prepandial:   less than 140 mg/dL      Peak postprandial:   less than 180 mg/dL (1-2 hours)      Critically ill patients:  140 - 180 mg/dL   Results for [name], [name] (MRN [id number]) as of 09/28/2018 11:29  Ref. Range 09/27/2018 07:45 09/27/2018 11:34 09/27/2018 15:24 09/27/2018 19:26 09/27/2018 23:07 09/28/2018 03:03 09/28/2018 07:48  Glucose-Capillary Latest Ref Range: 70 - 99 mg/dL 156 (H) 153 (H) 192 (H) 219 (H) 245 (H) 256 (H) 340 (H)   Review of Glycemic Control  Diabetes history: DM 2 Outpatient Diabetes medications: Glipizide 10 mg Daily, Metformin 1000 mg BID, Januvia 100 mg Daily Current orders for Inpatient glycemic control: Novolog 0-9 units Q4 hours  Inpatient Diabetes Program Recommendations:    Tube Feeds: Vital HP 45 ml/hour Solucortef IV 50 mg Q6 hours  Consider ordering ICU Glycemic Control order set Phase 1 SQ insulin in addition to starting Tube Feed Coverage Novolog 5 units Q4 hours as glucose trends increased significantly after tube feeds were initiated.  Thanks,  Tama Headings RN, MSN, BC-ADM Inpatient Diabetes Coordinator Team Pager [phone] (8a-5p)

## 2018-09-28 NOTE — Progress Notes (Signed)
Nutrition Follow-up RD working remotely.  DOCUMENTATION CODES:   Obesity unspecified  INTERVENTION:   Continue TF via OGT:  Vital High Protein at 45 ml/h (1080 ml per day) to provide 1080 kcal, 95 gm protein, 903 ml free water, 120 gm carbohydrate daily.  Recommend adjust insulin for better glucose control.   NUTRITION DIAGNOSIS:   Inadequate oral intake related to inability to eat as evidenced by NPO status.  Ongoing   GOAL:   Provide needs based on ASPEN/SCCM guidelines  Met with TF  MONITOR:   Vent status, TF tolerance, Labs  ASSESSMENT:   62 yo female with PMH of A fib, asthma, GERD, HLD, CHF, DM-2, breast cancer, and recurrent UTI's who was admitted with septic shock, E coli UTI, suspected renal calculus, GNR bacteremia, and C diff colitis. Required intubation 3/24 afternoon.  TF was initiated on 3/26. Patient is tolerating TF well at goal rate. COVID-19 test results still pending; very low risk.  Patient remains intubated on ventilator support MV: 7.8 L/min Temp (24hrs), Avg:97.9 F (36.6 C), Min:97.5 F (36.4 C), Max:98.5 F (36.9 C)   Labs reviewed. BUN 93 (H), creatinine 2.95 (H) CBG's: 256-340 Medications reviewed and include Solu-cortef, Novolog, Levophed.  Weight up from 75.8 kg on 3/25. I/O + 2.5 L since admission  Diet Order:   Diet Order            Diet NPO time specified  Diet effective now              EDUCATION NEEDS:   No education needs have been identified at this time  Skin:  Skin Assessment: Reviewed RN Assessment  Last BM:  3/27 (type 7)  Height:   Ht Readings from Last 1 Encounters:  09/23/18 4' 11" (1.499 m)    Weight:   Wt Readings from Last 1 Encounters:  09/28/18 78.5 kg    Ideal Body Weight:  44.5 kg  BMI:  Body mass index is 34.95 kg/m.  Estimated Nutritional Needs:   Kcal:  900-1100  Protein:  89 gm  Fluid:  1.5 L    Kimberly Harris, RD, LDN, CNSC Pager [phone] After Hours Pager  [phone]  

## 2018-09-28 NOTE — Progress Notes (Signed)
Patient CBG 413.  MD notified, awaiting further orders.

## 2018-09-29 ENCOUNTER — Inpatient Hospital Stay (HOSPITAL_COMMUNITY): Payer: Medicare Other

## 2018-09-29 LAB — CBC
HCT: 25.4 % — ABNORMAL LOW (ref 36.0–46.0)
HEMOGLOBIN: 7.9 g/dL — AB (ref 12.0–15.0)
MCH: 28.9 pg (ref 26.0–34.0)
MCHC: 31.1 g/dL (ref 30.0–36.0)
MCV: 93 fL (ref 80.0–100.0)
Platelets: 69 10*3/uL — ABNORMAL LOW (ref 150–400)
RBC: 2.73 MIL/uL — ABNORMAL LOW (ref 3.87–5.11)
RDW: 16.9 % — ABNORMAL HIGH (ref 11.5–15.5)
WBC: 28.7 10*3/uL — ABNORMAL HIGH (ref 4.0–10.5)
nRBC: 1.8 % — ABNORMAL HIGH (ref 0.0–0.2)

## 2018-09-29 LAB — COMPREHENSIVE METABOLIC PANEL
ALT: 346 U/L — ABNORMAL HIGH (ref 0–44)
AST: 217 U/L — ABNORMAL HIGH (ref 15–41)
Albumin: 2.1 g/dL — ABNORMAL LOW (ref 3.5–5.0)
Alkaline Phosphatase: 358 U/L — ABNORMAL HIGH (ref 38–126)
Anion gap: 11 (ref 5–15)
BUN: 109 mg/dL — ABNORMAL HIGH (ref 8–23)
CO2: 21 mmol/L — ABNORMAL LOW (ref 22–32)
Calcium: 8.7 mg/dL — ABNORMAL LOW (ref 8.9–10.3)
Chloride: 114 mmol/L — ABNORMAL HIGH (ref 98–111)
Creatinine, Ser: 2.78 mg/dL — ABNORMAL HIGH (ref 0.44–1.00)
GFR calc Af Amer: 20 mL/min — ABNORMAL LOW (ref >60)
GFR calc non Af Amer: 17 mL/min — ABNORMAL LOW (ref >60)
Glucose, Bld: 347 mg/dL — ABNORMAL HIGH (ref 70–99)
Potassium: 3.7 mmol/L (ref 3.5–5.1)
Sodium: 146 mmol/L — ABNORMAL HIGH (ref 135–145)
Total Bilirubin: 2.8 mg/dL — ABNORMAL HIGH (ref 0.3–1.2)
Total Protein: 6.1 g/dL — ABNORMAL LOW (ref 6.5–8.1)

## 2018-09-29 LAB — MAGNESIUM: Magnesium: 2.4 mg/dL (ref 1.7–2.4)

## 2018-09-29 LAB — GLUCOSE, CAPILLARY
Glucose-Capillary: 194 mg/dL — ABNORMAL HIGH (ref 70–99)
Glucose-Capillary: 201 mg/dL — ABNORMAL HIGH (ref 70–99)
Glucose-Capillary: 241 mg/dL — ABNORMAL HIGH (ref 70–99)
Glucose-Capillary: 285 mg/dL — ABNORMAL HIGH (ref 70–99)
Glucose-Capillary: 304 mg/dL — ABNORMAL HIGH (ref 70–99)

## 2018-09-29 MED ORDER — HYDRALAZINE HCL 20 MG/ML IJ SOLN
10.0000 mg | Freq: Four times a day (QID) | INTRAMUSCULAR | Status: DC | PRN
  Administered 2018-09-29 (×2): 10 mg via INTRAVENOUS
  Filled 2018-09-29 (×2): qty 1

## 2018-09-29 NOTE — Procedures (Signed)
Extubation Procedure Note  Patient Details:   Name: [name] DOB: 08/03/1955 MRN: [id number]   Airway Documentation:    Vent end date: 09/29/18 Vent end time: 1205   Evaluation  O2 sats: stable throughout Complications: No apparent complications Patient did tolerate procedure well. Bilateral Breath Sounds: Clear, Diminished   Yes   Positive cuff leak noted.  Pt placed on [place] 4 L with humidity, no stridor noted.  Pt able to reach 500 using incentive spirometer.  Mingo Amber Kelsay Haggard 09/29/2018, 1:09 PM

## 2018-09-29 NOTE — Progress Notes (Signed)
NAME:  [name], MRN:  [id number], DOB:  [birth year], LOS: 6 ADMISSION DATE:  09/23/2018, CONSULTATION DATE:  09/23/2018 REFERRING MD:  None , CHIEF COMPLAINT:  Septic shock   History of present illness   [name] is a 63 year old female with history of Q7HA, systolic HF, AF on Eliquis who presents as a transfer from [place] for higher level of care for septic shock.  Admitted with fever, right hydronephrosis/pyelonephritis and septic shock with gram-negative sepsis.    There was some concern for coronavirus infection as she was recently at a wedding with 20 people (unknown if any positive exposures).  She had a flu swab done that was negative.  Covid tested at [facility].  However overt sepsis attributed to GNR sepsis.  Past Medical History  Type II DM HFrEF A-fib on anticoagulation  Significant Hospital Events   3/22 Transferred to [facility] from [place] 3/24 Intubated  Consults:  urology  Procedures:  3/21 R IJ CVC >> 3/22 R radial Aline >> 3/24 ETT >> 3/24 foley >>  Significant Diagnostic Tests:  CT abdomen and pelvis 3/20 1-54mm right UPJ stone with moderate right hydronephrosis and prominent right perinephrid edema 22mm non obstructing stone upper pole right kidney  Abdominal Ultrasound  Probable fatty infiltration of liver  Moderate hydronephrosis   Echo 08/2018  1. The left ventricle has decreased systolic function with an ejection fraction of 40-45%. The cavity size was normal. There is moderate to severe concentric left ventricular hypertrophy.  2. The right ventricle has normal systolic function. The cavity was normal. There is no increase in right ventricular wall thickness.  3. The tricuspid valve is normal in structure.  4. The pulmonic valve was normal in structure.  5. Right atrial pressure is estimated at 3 mmHg.  6. The aortic root, ascending aorta and aortic arch are normal in size and structure.  7. The aortic valve is tricuspid.  8. The  mitral valve is normal in structure with mild MR  Repeat renal ultrasound 3/22>> resolved hydronephrosis  CT abd / pelvis 3/25 >> no evidence of bowel wall thickening, distention, inflammatory changes.  Stomach normal.  Mild right hydronephrosis with a 1 to 2 mm nonobstructing right renal stone.  The previously seen UPJ calculus is no longer present  Micro Data:  -Blood culture from [place]-Gram negative rods >> E Coli, pan sensitive - UC from [place] - E. Coli - pan sensitive -Stool cdiff at [facility] - PCR +, Antigen +, toxin neg  - tested for Covid at [facility] ER >>  3/22 BC x 2 >> negative 3/22 MRSA PCR >> negative  Antimicrobials:  PO vanc 3/20 >> 3/26 Zosyn 3/20 >> 3/24 3/25 Ceftriaxone >>   Subjective/ Interval Events:  Tolerating pressure support this morning Mental status is improved  Objective   Blood pressure 127/70, pulse 87, temperature 98.2 F (36.8 C), temperature source Oral, resp. rate 19, height 4\' 11"  (1.499 m), weight 78.5 kg, SpO2 97 %. CVP:  [6 mmHg-14 mmHg] 7 mmHg  Vent Mode: PSV;CPAP FiO2 (%):  [40 %] 40 % Set Rate:  [24 bmp] 24 bmp Vt Set:  [340 mL] 340 mL PEEP:  [5 cmH20] 5 cmH20 Pressure Support:  [5 cmH20] 5 cmH20 Plateau Pressure:  [17 cmH20-20 cmH20] 20 cmH20   Intake/Output Summary (Last 24 hours) at 09/29/2018 1845 Last data filed at 09/29/2018 1800 Gross per 24 hour  Intake 1190.06 ml  Output 1785 ml  Net -594.94 ml   [employer]  09/27/18 0500 09/28/18 0413 09/29/18 0400  Weight: 79.5 kg 78.5 kg 78.5 kg   Examination: General: Ill-appearing woman, ventilated, no distress HEENT: ET tube in place, G-tube in place, mild scleral icterus Neuro: Awake, nods to questions, interacts, follows commands CV: Regularly irregular, no murmur PULM: Diminished at both bases, no wheeze, no crackles GI: Obese, soft, nondistended with positive bowel sounds Extremities: No significant lower extremity edema Skin: No rash  Assessment & Plan:   Septic shock from GNR bacteremia, hemodynamically improved - 3/21 BC/ UC from [facility] positive for E Coli  - Patient with septic shock due to gram negative rod bacteremia from urinary source. She has right sided kidney stones causing moderate hydronephrosis, since resolved on renal US 3/22 - seen by Urology 3/22- no need for nephrostomy tube Plans: Continue current ceftriaxone as ordered, this is day 8 total antibiotics but note also treating left lower lobe cure associated pneumonia Appreciate urology assistance, repeat CT scan of the abdomen without any evidence of obstruction.  Stenting, percutaneous drainage deferred Begin to wean stress dose steroids 3/27, decreased again on 3/29  Acute hypoxic respiratory failure R/o LLL atelectasis vs infiltrate/ aspiration  - CXR 3/25- stable tubes, ongoing LLL opacity- atelectasis +/- inflitrate; small bilateral pleural effusions P:  Tolerating pressure support, plan to extubate today Likely need to extend the antibiotic course to at least 3/29 to cover left lower lobe pneumonia Follow intermittent chest x-ray  R/o C.diff colitis-follow-up testing negative - Stool cdiff at [facility] [place] - PCR +, Antigen +, toxin neg ad.  Had diarrhea on presentation, but since has had minimal stool output - no evidence of bowel inflammation on CT abd/ pelvis 3/20 -Follow-up C. difficile testing -3/26 Plans: Contact isolation discontinued Enteral vancomycin stopped on 3/26  Acute kidney Injury, improving Obstructive uropathy- resolved AKI is likely multifactorial partly due to obstructive uropathy from kidney stones and partly prerenal due to shock. Her baseline creatinine is 1-1.3. Plans: Continue to follow urine output, BMP Avoid nephrotoxins  Hypomagnesemia - resolved Hypocalcemia Hypokalemia Plans: Replace electrolytes as indicated  Severe thrombocytopenia.   Suspect DIC- initial test neg Follow CBC, platelets remain stable in the 60s  Atrial  fibrillation  Patient with history of a-fib on carvedilol, fleicanide, and eliquis at home. She was on a heparin gtt at the outside hospital.  Per patient last dose was given at [facility] on Saturday Plans: Labetalol, Entresto, diuretics are all on hold, restart when able Home flecainide is on hold, plan to reinitiate soon.  Amiodarone has been stopped Anticoagulation on hold given the thrombocytopenia  Chronic systolic Heart failure Holding Entresto, carvedilol and diuretics until hemodynamically stable Reinitiate as blood pressure will allow  Concern for COVID 19 Patient is very low risk. Her low grade fever was due to E. coli bacteremia. Test already obtained at O[facility] Plans: Continue to follow final lab result for novel coronavirus COVID-19 Isolation was stopped on 3/24  DM P:  Change sliding scale insulin to standard scale, add Lantus 3/27 Home Januvia, metformin, glipizide on hold  Transaminitis, likely shock liver.  Improving No evidence for cholestasis or gallbladder disease on CT abdomen P:  Follow LFT Statin temporarily on hold   Best practice:  Diet: NPO Pain/Anxiety/Delirium protocol (if indicated): fentanyl /versed VAP protocol (if indicated): yes DVT prophylaxis:SCD GI prophylaxis: PPI Glucose control: SSI Mobility: PT and OT once appropriate Code Status: Full code  Family Communication: Will call husband to update Disposition: ICU    Independent CC time 35 mins  Baltazar Apo, MD, PhD 09/29/2018, 6:45 PM [place] [facility] [phone] or if no answer [phone]

## 2018-09-29 NOTE — Progress Notes (Signed)
SLP Cancellation Note  Patient Details Name: [name] MRN: [id number] DOB: [birth year]   Cancelled treatment:       Reason Eval/Treat Not Completed: Medical issues which prohibited therapy. Per charting pt extubated at 12:05 pm. SLP to follow up next date.  Deneise Lever, [place], CCC-SLP Speech-Language Pathologist Acute Rehabilitation Services Pager: [phone] Office: [phone]    Aliene Altes 09/29/2018, 2:48 PM

## 2018-09-30 LAB — GLUCOSE, CAPILLARY
GLUCOSE-CAPILLARY: 153 mg/dL — AB (ref 70–99)
Glucose-Capillary: 224 mg/dL — ABNORMAL HIGH (ref 70–99)
Glucose-Capillary: 177 mg/dL — ABNORMAL HIGH (ref 70–99)
Glucose-Capillary: 157 mg/dL — ABNORMAL HIGH (ref 70–99)
Glucose-Capillary: 185 mg/dL — ABNORMAL HIGH (ref 70–99)
Glucose-Capillary: 188 mg/dL — ABNORMAL HIGH (ref 70–99)

## 2018-09-30 MED ORDER — HYDROCORTISONE NA SUCCINATE PF 100 MG IJ SOLR: 50.0000 mg | Freq: Every day | INTRAMUSCULAR | Status: DC

## 2018-09-30 MED ORDER — ALBUTEROL SULFATE HFA 108 (90 BASE) MCG/ACT IN AERS
2.0000 | INHALATION_SPRAY | RESPIRATORY_TRACT | Status: DC | PRN
  Filled 2018-09-30: qty 6.7

## 2018-09-30 MED ORDER — METOPROLOL TARTRATE 5 MG/5ML IV SOLN
5.0000 mg | Freq: Three times a day (TID) | INTRAVENOUS | Status: DC
  Administered 2018-09-30 – 2018-10-03 (×11): 5 mg via INTRAVENOUS
  Filled 2018-09-30 (×11): qty 5

## 2018-09-30 NOTE — Progress Notes (Signed)
NAME:  [name], MRN:  [id number], DOB:  1955-11-04, LOS: 7 ADMISSION DATE:  09/23/2018, CONSULTATION DATE:  09/23/2018 REFERRING MD:  None , CHIEF COMPLAINT:  Septic shock   History of present illness   [name] is a 63 year old female with history of H8IO, systolic HF, AF on Eliquis who presents as a transfer from [place] for higher level of care for septic shock.  Admitted with fever, right hydronephrosis/pyelonephritis and septic shock with gram-negative sepsis.    There was some concern for coronavirus infection as she was recently at a wedding with 20 people (unknown if any positive exposures).  She had a flu swab done that was negative.  Covid tested at [facility].  However overt sepsis attributed to GNR sepsis.  Past Medical History  Type II DM HFrEF A-fib on anticoagulation  Significant Hospital Events   3/22 Transferred to [facility] from [place] 3/24 Intubated  Consults:  urology  Procedures:  3/21 R IJ CVC >> 3/22 R radial Aline >> 3/24 ETT >> 3/24 foley >>  Significant Diagnostic Tests:  CT abdomen and pelvis 3/20 1-16mm right UPJ stone with moderate right hydronephrosis and prominent right perinephrid edema 29mm non obstructing stone upper pole right kidney  Abdominal Ultrasound  Probable fatty infiltration of liver  Moderate hydronephrosis   Echo 08/2018  1. The left ventricle has decreased systolic function with an ejection fraction of 40-45%. The cavity size was normal. There is moderate to severe concentric left ventricular hypertrophy.  2. The right ventricle has normal systolic function. The cavity was normal. There is no increase in right ventricular wall thickness.  3. The tricuspid valve is normal in structure.  4. The pulmonic valve was normal in structure.  5. Right atrial pressure is estimated at 3 mmHg.  6. The aortic root, ascending aorta and aortic arch are normal in size and structure.  7. The aortic valve is tricuspid.  8. The  mitral valve is normal in structure with mild MR  Repeat renal ultrasound 3/22>> resolved hydronephrosis  CT abd / pelvis 3/25 >> no evidence of bowel wall thickening, distention, inflammatory changes.  Stomach normal.  Mild right hydronephrosis with a 1 to 2 mm nonobstructing right renal stone.  The previously seen UPJ calculus is no longer present  Micro Data:  -Blood culture from [facility]-Gram negative rods >> E Coli, pan sensitive - UC from [place] - E. Coli - pan sensitive -Stool cdiff at [facility] - PCR +, Antigen +, toxin neg  - tested for Covid at [facility] ER >>  3/22 BC x 2 >> negative 3/22 MRSA PCR >> negative  Antimicrobials:  PO vanc 3/20 >> 3/26 Zosyn 3/20 >> 3/24 3/25 Ceftriaxone >>   Subjective/ Interval Events:  Out of bed to chair Serum creatinine continues to improve, still has not normalized White count remains elevated, platelets low but stable LFTs slightly better  Objective   Blood pressure (!) 142/87, pulse 83, temperature (!) 97.5 F (36.4 C), temperature source Oral, resp. rate (!) 21, height 4\' 11"  (1.499 m), weight 75.8 kg, SpO2 99 %. CVP:  [5 mmHg-10 mmHg] 5 mmHg      Intake/Output Summary (Last 24 hours) at 09/30/2018 1508 Last data filed at 09/30/2018 1300 Gross per 24 hour  Intake 200 ml  Output 2025 ml  Net -1825 ml   Filed Weights   09/28/18 0413 09/29/18 0400 09/30/18 0600  Weight: 78.5 kg 78.5 kg 75.8 kg   Examination: General: No distress, lying in  bed HEENT: Oropharynx clear, mild icterus Neuro: Wake, globally weak but nods to questions, follows commands, interactive CV: Irregularly irregular, no murmur PULM: Distant both bases GI: Obese, soft, nondistended with positive bowel sounds Extremities: No significant lower extremity edema Skin: No rash  Assessment & Plan:  Septic shock from GNR bacteremia, hemodynamically improved - 3/21 BC/ UC from [facility] positive for E Coli  - Patient with septic shock due to gram negative rod  bacteremia from urinary source. She has right sided kidney stones causing moderate hydronephrosis, since resolved on renal US 3/22 - seen by Urology 3/22- no need for nephrostomy tube Plans: Last day of ceftriaxone today (treat this and also her presumed H CAP) Appreciate urology assistance, repeat CT scan of the abdomen without any evidence of obstruction.  Stenting, percutaneous drainage deferred Decrease stress dose steroids again on 3/29, probably off in the next 1 to 2 days  Acute hypoxic respiratory failure R/o LLL atelectasis vs infiltrate/ aspiration  - CXR 3/25- stable tubes, ongoing LLL opacity- atelectasis +/- inflitrate; small bilateral pleural effusions P:  Extubated successfully Ceftriaxone for H CAP completed 3/29 Follow intermittent chest x-ray  R/o C.diff colitis-follow-up testing negative - Stool cdiff at [facility] - PCR +, Antigen +, toxin neg ad.  Had diarrhea on presentation, but since has had minimal stool output - no evidence of bowel inflammation on CT abd/ pelvis 3/20 -Follow-up C. difficile testing -3/26 Plans: Contact isolation discontinued Enteral vancomycin stopped on 3/26  Acute kidney Injury, improving Obstructive uropathy- resolved AKI is likely multifactorial partly due to obstructive uropathy from kidney stones and partly prerenal due to shock. Her baseline creatinine is 1-1.3. Plans: Continue to follow urine output, BMP Avoid nephrotoxins  Hypomagnesemia - resolved Hypocalcemia Hypokalemia Plans: Replace electrolytes as indicated  Severe thrombocytopenia.   Suspect DIC- initial test neg Follow CBC, platelets remain stable in the 60s  Atrial fibrillation  Patient with history of a-fib on carvedilol, fleicanide, and eliquis at home. She was on a heparin gtt at the outside hospital.  Per patient last dose was given at [facility] on Saturday Plans: Labetalol, Entresto, diuretics are all on hold, restart when able Home flecainide is on hold, plan  to reinitiate soon.  Amiodarone has been stopped Anticoagulation on hold given the thrombocytopenia  Chronic systolic Heart failure Holding Entresto, carvedilol and diuretics until hemodynamically stable Reinitiate as blood pressure will allow  Concern for COVID 19 Patient is very low risk. Her low grade fever was due to E. coli bacteremia. Test already obtained at O[facility] Plans: Continue to follow final lab result for novel coronavirus COVID-19 Isolation was stopped on 3/24.  Appreciate [name] input, note from 3/29 reviewed  DM P:  Change sliding scale insulin to standard scale, add Lantus 3/27 Home Januvia, metformin, glipizide on hold  Transaminitis, likely shock liver.  Improving No evidence for cholestasis or gallbladder disease on CT abdomen P:  Follow LFT Statin temporarily on hold   Best practice:  Diet: NPO Pain/Anxiety/Delirium protocol (if indicated): fentanyl /versed VAP protocol (if indicated): yes DVT prophylaxis:SCD GI prophylaxis: PPI Glucose control: SSI Mobility: PT and OT once appropriate Code Status: Full code  Family Communication: Will call husband to update Disposition: Transfer to progressive care bed on 3/29.  We will ask TRH to assume her care     Baltazar Apo, MD, PhD 09/30/2018, 3:08 PM [facility] [phone] or if no answer [phone]

## 2018-09-30 NOTE — Progress Notes (Signed)
SLP Cancellation Note  Patient Details Name: [name] MRN: [id number] DOB: 1955-11-17   Cancelled treatment:       Reason Eval/Treat Not Completed: Medical issues which prohibited therapy. Per health system leadership, therapy services are being held at this time until patient tests negative for COVID-19.     Deneise Lever, [place], CCC-SLP Speech-Language Pathologist Acute Rehabilitation Services Pager: [phone] Office: [phone]    Aliene Altes 09/30/2018, 8:22 AM

## 2018-09-30 NOTE — Progress Notes (Signed)
Call to elink at [phone] to speak to doctor on call. Pt has several medications on hold for Afib and is not in Afib and rate is increasing. Rate is sustaining 100-110 but reaching as high as 130s. Pt resting comfortably without complaints. Doctor states she would place an order after reviewing chart. Dr Vonzella Nipple Metoprolol.

## 2018-09-30 NOTE — Progress Notes (Signed)
Called [facility] COVID Test received 3-21, still pending  Discussed with CCM, she has a clear alternative diagnosis.  Her CXR is not consistent with viral infection.  She has been afebrile on anbx, which is not consistent with COVID  Will remove COVID precautions for patient.

## 2018-09-30 NOTE — Progress Notes (Signed)
OT Cancellation Note  Patient Details Name: [name] MRN: [id number] DOB: January 19, 1956   Cancelled Treatment:    Reason Eval/Treat Not Completed: Other (comment)(Pending COVID-19 test.) Per health system leadership, therapy services are being held at this time until patient tests negative for COVID-19.   Will return as schedule allows. Thank you.  [place], OTR/L Acute Rehab Pager: [phone] Office: [phone] 09/30/2018, 7:43 AM

## 2018-09-30 NOTE — Progress Notes (Signed)
Patient arrived from the ICU. Patient alert to self and location and situation. Patient oriented to unit and tele monitor M08 placed on patient.

## 2018-09-30 NOTE — Progress Notes (Signed)
PT Cancellation Note  Patient Details Name: LEYANNA BITTMAN MRN: [id number] DOB: March 18, 1956   Cancelled Treatment:    Reason Eval/Treat Not Completed: Medical issues which prohibited therapy. Per health system leadership, therapy services are being held at this time until patient tests negative for COVID-19.    Ellamae Sia, PT, DPT Acute Rehabilitation Services Pager [phone] Office [phone]     Willy Eddy 09/30/2018, 7:44 AM

## 2018-10-01 ENCOUNTER — Inpatient Hospital Stay (HOSPITAL_COMMUNITY): Payer: Medicare Other

## 2018-10-01 LAB — C DIFFICILE QUICK SCREEN W PCR REFLEX
C Diff antigen: NEGATIVE
C Diff interpretation: NOT DETECTED
C Diff toxin: NEGATIVE

## 2018-10-01 LAB — COMPREHENSIVE METABOLIC PANEL
ALT: 179 U/L — ABNORMAL HIGH (ref 0–44)
AST: 49 U/L — ABNORMAL HIGH (ref 15–41)
Albumin: 2 g/dL — ABNORMAL LOW (ref 3.5–5.0)
Alkaline Phosphatase: 257 U/L — ABNORMAL HIGH (ref 38–126)
Anion gap: 6 (ref 5–15)
BUN: 85 mg/dL — ABNORMAL HIGH (ref 8–23)
CO2: 26 mmol/L (ref 22–32)
Calcium: 7.6 mg/dL — ABNORMAL LOW (ref 8.9–10.3)
Chloride: 125 mmol/L — ABNORMAL HIGH (ref 98–111)
Creatinine, Ser: 1.59 mg/dL — ABNORMAL HIGH (ref 0.44–1.00)
GFR calc Af Amer: 40 mL/min — ABNORMAL LOW (ref >60)
GFR calc non Af Amer: 34 mL/min — ABNORMAL LOW (ref >60)
GLUCOSE: 119 mg/dL — AB (ref 70–99)
Potassium: 2.2 mmol/L — CL (ref 3.5–5.1)
Sodium: 157 mmol/L — ABNORMAL HIGH (ref 135–145)
Total Bilirubin: 2 mg/dL — ABNORMAL HIGH (ref 0.3–1.2)
Total Protein: 5.1 g/dL — ABNORMAL LOW (ref 6.5–8.1)

## 2018-10-01 LAB — GLUCOSE, CAPILLARY
GLUCOSE-CAPILLARY: 134 mg/dL — AB (ref 70–99)
GLUCOSE-CAPILLARY: 180 mg/dL — AB (ref 70–99)
GLUCOSE-CAPILLARY: 337 mg/dL — AB (ref 70–99)
Glucose-Capillary: 117 mg/dL — ABNORMAL HIGH (ref 70–99)
Glucose-Capillary: 164 mg/dL — ABNORMAL HIGH (ref 70–99)
Glucose-Capillary: 282 mg/dL — ABNORMAL HIGH (ref 70–99)

## 2018-10-01 LAB — CBC
HCT: 26.7 % — ABNORMAL LOW (ref 36.0–46.0)
HEMOGLOBIN: 8.3 g/dL — AB (ref 12.0–15.0)
MCH: 29.6 pg (ref 26.0–34.0)
MCHC: 31.1 g/dL (ref 30.0–36.0)
MCV: 95.4 fL (ref 80.0–100.0)
Platelets: 92 10*3/uL — ABNORMAL LOW (ref 150–400)
RBC: 2.8 MIL/uL — AB (ref 3.87–5.11)
RDW: 17.4 % — ABNORMAL HIGH (ref 11.5–15.5)
WBC: 44.1 10*3/uL — ABNORMAL HIGH (ref 4.0–10.5)
nRBC: 0.7 % — ABNORMAL HIGH (ref 0.0–0.2)

## 2018-10-01 LAB — PROTIME-INR
INR: 1.5 — ABNORMAL HIGH (ref 0.8–1.2)
Prothrombin Time: 17.8 seconds — ABNORMAL HIGH (ref 11.4–15.2)

## 2018-10-01 LAB — MAGNESIUM: MAGNESIUM: 2 mg/dL (ref 1.7–2.4)

## 2018-10-01 LAB — AMMONIA: Ammonia: 34 umol/L (ref 9–35)

## 2018-10-01 LAB — POTASSIUM: Potassium: 2.9 mmol/L — ABNORMAL LOW (ref 3.5–5.1)

## 2018-10-01 LAB — BRAIN NATRIURETIC PEPTIDE: B Natriuretic Peptide: 2948.4 pg/mL — ABNORMAL HIGH (ref 0.0–100.0)

## 2018-10-01 MED ORDER — POTASSIUM CHLORIDE CRYS ER 20 MEQ PO TBCR
40.0000 meq | EXTENDED_RELEASE_TABLET | Freq: Once | ORAL | Status: AC
  Administered 2018-10-01: 40 meq via ORAL
  Filled 2018-10-01: qty 2

## 2018-10-01 MED ORDER — METRONIDAZOLE IN NACL 5-0.79 MG/ML-% IV SOLN
500.0000 mg | Freq: Three times a day (TID) | INTRAVENOUS | Status: DC
  Administered 2018-10-01 – 2018-10-02 (×4): 500 mg via INTRAVENOUS
  Filled 2018-10-01 (×4): qty 100

## 2018-10-01 MED ORDER — SODIUM CHLORIDE 0.9 % IV SOLN
1.0000 g | INTRAVENOUS | Status: AC
  Administered 2018-10-01 – 2018-10-04 (×4): 1 g via INTRAVENOUS
  Filled 2018-10-01 (×4): qty 10

## 2018-10-01 MED ORDER — INSULIN GLARGINE 100 UNIT/ML ~~LOC~~ SOLN
10.0000 [IU] | Freq: Every day | SUBCUTANEOUS | Status: DC
  Filled 2018-10-01: qty 0.1

## 2018-10-01 MED ORDER — LORAZEPAM 2 MG/ML IJ SOLN
0.5000 mg | Freq: Once | INTRAMUSCULAR | Status: AC
  Administered 2018-10-01: 0.5 mg via INTRAVENOUS
  Filled 2018-10-01: qty 1

## 2018-10-01 MED ORDER — NITROGLYCERIN 2 % TD OINT
0.5000 [in_us] | TOPICAL_OINTMENT | Freq: Four times a day (QID) | TRANSDERMAL | Status: DC
  Administered 2018-10-01 – 2018-10-04 (×12): 0.5 [in_us] via TOPICAL
  Filled 2018-10-01 (×2): qty 30

## 2018-10-01 MED ORDER — HEPARIN SODIUM (PORCINE) 5000 UNIT/ML IJ SOLN
5000.0000 [IU] | Freq: Three times a day (TID) | INTRAMUSCULAR | Status: DC
  Administered 2018-10-01 – 2018-10-03 (×6): 5000 [IU] via SUBCUTANEOUS
  Filled 2018-10-01 (×6): qty 1

## 2018-10-01 MED ORDER — VANCOMYCIN 50 MG/ML ORAL SOLUTION
125.0000 mg | Freq: Four times a day (QID) | ORAL | Status: DC
  Administered 2018-10-01 – 2018-10-02 (×4): 125 mg
  Filled 2018-10-01 (×9): qty 2.5

## 2018-10-01 MED ORDER — HYDRALAZINE HCL 20 MG/ML IJ SOLN: 10.0000 mg | Freq: Four times a day (QID) | INTRAMUSCULAR | Status: DC | PRN

## 2018-10-01 MED ORDER — DEXTROSE 5 % IV SOLN
INTRAVENOUS | Status: DC
  Administered 2018-10-01 – 2018-10-02 (×3): via INTRAVENOUS

## 2018-10-01 MED ORDER — RESOURCE THICKENUP CLEAR PO POWD
ORAL | Status: DC | PRN
  Filled 2018-10-01 (×2): qty 125

## 2018-10-01 MED ORDER — ALPRAZOLAM 0.25 MG PO TABS
0.2500 mg | ORAL_TABLET | Freq: Once | ORAL | Status: DC
  Filled 2018-10-01: qty 1

## 2018-10-01 MED ORDER — MAGNESIUM SULFATE IN D5W 1-5 GM/100ML-% IV SOLN
1.0000 g | Freq: Once | INTRAVENOUS | Status: AC
  Administered 2018-10-01: 1 g via INTRAVENOUS
  Filled 2018-10-01: qty 100

## 2018-10-01 MED ORDER — POTASSIUM CHLORIDE 10 MEQ/100ML IV SOLN
10.0000 meq | INTRAVENOUS | Status: AC
  Administered 2018-10-01 (×2): 10 meq via INTRAVENOUS
  Filled 2018-10-01 (×3): qty 100

## 2018-10-01 MED ORDER — LOPERAMIDE HCL 2 MG PO CAPS
4.0000 mg | ORAL_CAPSULE | Freq: Four times a day (QID) | ORAL | Status: AC
  Filled 2018-10-01: qty 2

## 2018-10-01 MED ORDER — POTASSIUM CHLORIDE 10 MEQ/100ML IV SOLN
10.0000 meq | INTRAVENOUS | Status: AC
  Administered 2018-10-01 (×6): 10 meq via INTRAVENOUS
  Filled 2018-10-01 (×5): qty 100

## 2018-10-01 MED ORDER — SODIUM CHLORIDE 0.9% FLUSH
10.0000 mL | INTRAVENOUS | Status: DC | PRN
  Administered 2018-10-02 – 2018-10-03 (×2): 10 mL
  Filled 2018-10-01 (×2): qty 40

## 2018-10-01 NOTE — TOC Progression Note (Signed)
Transition of Care [facility]) - Progression Note    Patient Details  Name: [name] MRN: [id number] Date of Birth: 29-May-1956  Transition of Care [facility]) CM/SW [place], LCSW Phone Number: 10/01/2018, 1:51 PM  Clinical Narrative:    CSW returned call from patient's spouse. He reports that he is very hopeful that patient will be able to return home with him. He states that she has done outpatient rehab in [place] before but has never had home health services. He reports that patient has all equipment at home. He says if absolutely necessary, patient can go to SNF and he prefers [facility] [employer]. He does prefer patient to return home. CSW explained that PT will work with patient to determine how she is doing.    Expected Discharge Plan: Home/Self Care(from home with husband) Barriers to Discharge: Continued Medical Work up  Expected Discharge Plan and Services Expected Discharge Plan: Home/Self Care(from home with husband)       Living arrangements for the past 2 months: Single Family Home                           Social Determinants of Health (SDOH) Interventions    Readmission Risk Interventions Readmission Risk Prevention Plan 09/26/2018 09/26/2018  Transportation Screening Complete (No Data)  [place] or [place] Not Complete -  [place] or Home Care Consult comments Pending progression - based on PTA status HCC not needed -  Social Work Consult for [place] Planning/Counseling Complete -  Palliative Care Screening Not Complete -  Palliative Care Screening Not Complete Comments vented -  Some recent data might be hidden

## 2018-10-01 NOTE — Care Management Important Message (Signed)
Important Message  Patient Details  Name: [name] MRN: [id number] Date of Birth: 12/07/1955   Medicare Important Message Given:  Yes    Orbie Pyo 10/01/2018, 2:35 PM

## 2018-10-01 NOTE — Progress Notes (Signed)
Rehab Admissions Coordinator Note:  Per OT and SLP recommendation, this patient was screened by Jhonnie Garner for appropriateness for an Inpatient Acute Rehab Consult.  At this time, we are recommending Inpatient Rehab consult. AC will contact MD to request an IP Rehab Consult Order.   Jhonnie Garner 10/01/2018, 4:58 PM  I can be reached at [phone].

## 2018-10-01 NOTE — Progress Notes (Signed)
Santiago Glad from lab called with a critical potassium value of 2.2. On call notified.

## 2018-10-01 NOTE — Evaluation (Signed)
Clinical/Bedside Swallow Evaluation Patient Details  Name: [name] MRN: [id number] Date of Birth: 1956/06/14  Today's Date: 10/01/2018 Time: SLP Start Time (ACUTE ONLY): 2355 SLP Stop Time (ACUTE ONLY): 1456 SLP Time Calculation (min) (ACUTE ONLY): 23 min  Past Medical History:  Past Medical History:  Diagnosis Date  . Arthritis    "back, knees, arms, wrists" (05/15/2013)  . Asthma   . Breast cancer ([place])   . CHF (congestive heart failure) ([place])    "mild" (05/15/2013)  . Chronic bronchitis ([place])   . Chronic lower back pain   . Dysrhythmia    atrial fib/dr [place] [facility]  . Family history of anesthesia complication    "my mother also had PONV" (05/15/2013)  . GERD (gastroesophageal reflux disease)   . Heart murmur   . High cholesterol   . Kidney stones   . Migraine    "haven't had one in the early 2000's" (05/15/2013)  . Pneumonia    "used to be chronic; last time I had it was 2013" (05/15/2013)  . PONV (postoperative nausea and vomiting)   . Recurrent UTI (urinary tract infection)    "from the continuous kidney stones; take Macrodantin qd" (05/15/2013)  . Sepsis ([place]) 05   kidney stone infection  . Tension headache   . Type II diabetes mellitus ([place])    Past Surgical History:  Past Surgical History:  Procedure Laterality Date  . 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 BREAST BIOPSY Left 02/2001  . BREAST LUMPECTOMY Left 02/2001  . BREAST LUMPECTOMY Left 09/23/2013   Procedure: LEFT LUMPECTOMY WITH SPECIMEN MAMMOGRAM;  Surgeon: Adin Hector, MD;  Location: [place];  Service: General;  Laterality: Left;  . COLONOSCOPY    . DIAGNOSTIC LAPAROSCOPY     cyst-near ovary  . LITHOTRIPSY     "2-3 times prior to 2002" (05/15/2013)  . SPINAL FUSION N/A 05/08/2013   Procedure: T9-S1 INSTRUMENTED FUSION T12 -S1 DECOMPRESSION;  Surgeon: Melina Schools,  MD;  Location: [facility];  Service: Orthopedics;  Laterality: N/A;  . TUBAL LIGATION Bilateral 1985   HPI:  Pt is a 63 year old woman admitted to [facility] from [facility] on 3/22 with septic shock, AKI, +cdiff, uropathy 2/2 kidney stones. Pt intubated 3/24-3/28. PMH: DM, CHF with EF of 45%, afib.    Assessment / Plan / Recommendation Clinical Impression  Pt presents with concerns for dysphagia with aspiration due to four-day intubation and cognitive delays. Presents with consistent cough with large volumes of water; mild hoarseness; difficulty with mastication of materials on lunch tray (salmon/peas).  Nectar thick liquids were tolerated without obvious deficits.  Recommend downgrading diet diet to dysphagia 2, nectar thick liquids due to concerns for glottal incompetence and desensitization after prolonged intubation.  SLP will follow for safety, diet progression.  D/W RN.  SLP Visit Diagnosis: Dysphagia, unspecified (R13.10)    Aspiration Risk       Diet Recommendation   dys 2, nectar-thick  Medication Administration: Whole meds with puree    Other  Recommendations Oral Care Recommendations: Oral care BID   Follow up Recommendations Inpatient Rehab      Frequency and Duration min 2x/week  1 week       Prognosis Prognosis for Safe Diet Advancement: Good      Swallow Study   General HPI: Pt is a 63 year old woman admitted to [facility] from  [facility] on 3/22 with septic shock, AKI, +cdiff, uropathy 2/2 kidney stones. Pt intubated 3/24-3/28. PMH: DM, CHF with EF of 45%, afib.  Type of Study: Bedside Swallow Evaluation Previous Swallow Assessment: no Diet Prior to this Study: Dysphagia 3 (soft);Thin liquids Temperature Spikes Noted: No Respiratory Status: Nasal cannula History of Recent Intubation: Yes Length of Intubations (days): 4 days Date extubated: 09/29/18 Behavior/Cognition: Alert;Confused Oral Cavity Assessment: Lesions(on lips) Oral Care Completed by SLP: Recent  completion by staff Oral Cavity - Dentition: Missing dentition Self-Feeding Abilities: Needs assist Patient Positioning: Upright in chair Baseline Vocal Quality: Normal Volitional Cough: Weak Volitional Swallow: Able to elicit    Oral/Motor/Sensory Function Overall Oral Motor/Sensory Function: Within functional limits   Ice Chips Ice chips: Within functional limits Presentation: Spoon   Thin Liquid Thin Liquid: Impaired Presentation: Cup;Straw Pharyngeal  Phase Impairments: Cough - Immediate;Cough - Delayed    Nectar Thick Nectar Thick Liquid: Within functional limits   Honey Thick Honey Thick Liquid: Not tested   Puree Puree: Within functional limits   Solid     Solid: Impaired Oral Phase Impairments: Impaired mastication Oral Phase Functional Implications: Oral holding      Memory Heinrichs Laurice 10/01/2018,3:37 PM  Arnika Larzelere L. Tivis Ringer, [place] Office number [phone] Pager [phone]

## 2018-10-01 NOTE — Progress Notes (Signed)
0800: Patient very lethargic this morning. Responds to voice, then goes back to sleep. Will monitor.  1035: Call from patient husband. Would like a call from social work.

## 2018-10-01 NOTE — Progress Notes (Signed)
PT Evaluation   Clinical Impression: PTA pt living with husband in single story home with 2 steps to enter, utilizing Rollator for mobility and able to bathe and dress herself. Pt husband is there 24 hr/day and does the cooking and cleaning. Pt is limited in safe mobility by decrease strength, balance (strong left lateral lean) and endurance. Pt currently requires total A for bed mobility and modAx2 for sit>stand transfers. Pt was extremely happy to be getting out of bed today and is motivated to get back to her PLOF. Given pt's strong family support, PLOF and motivation pt is a perfect candidate for CIR level rehab to regain strength and mobility before eventually d/c home. PT will continue to follow acutely.    10/01/18 1400  PT Visit Information  Last PT Received On 10/01/18  Assistance Needed +2  History of Present Illness Pt is a 63 year old woman admitted to [facility] from [facility] on 3/22 with septic shock, AKI, +cdiff, uropathy 2/2 kidney stones. Pt intubated 3/24 to 3/28. PMH: DM, CHF with EF of 45%, afib.   Precautions  Precautions Fall  Restrictions  Weight Bearing Restrictions No  Home Living  Family/patient expects to be discharged to: Private residence  Living Arrangements Spouse/significant other  Available Help at Discharge Family;Available 24 hours/day  Type of Home House  Home Access Stairs to enter  Entrance Stairs-Number of Steps 2  Entrance Stairs-Rails Can reach both  Home Layout One level  Bathroom Shower/Tub Tub/shower unit (tub and shower separately )  Home Equipment [place] - single point;Walker - 4 wheels;Shower seat - built in  Prior Function  Level of Independence Needs assistance  Gait / Transfers Assistance Needed uses cane mostly, sometimes Rollator   ADL's / [place] able to bathe and dress, husband does cooking and Educational psychologist Other (comment) (increased time and effort)  Pain Assessment  Pain Assessment  Faces  Faces Pain Scale 4  Pain Location generalized with movement  Pain Descriptors / Indicators Grimacing;Guarding  Pain Intervention(s) Limited activity within patient's tolerance;Monitored during session;Repositioned  Cognition  Arousal/Alertness Awake/alert  Behavior During Therapy Flat affect  Overall Cognitive Status Difficult to assess  General Comments A&Ox3, provided good home living and prior level of function, slow processing and increased effort for responses  Upper Extremity Assessment  Upper Extremity Assessment Defer to OT evaluation  Lower Extremity Assessment  Lower Extremity Assessment Generalized weakness;RLE deficits/detail;LLE deficits/detail  Cervical / Trunk Assessment  Cervical / Trunk Assessment Kyphotic;Other exceptions (L lateral lean with sitting and standing)  Bed Mobility  Overal bed mobility Needs Assistance  Bed Mobility Supine to Sit  Supine to sit Total assist  General bed mobility comments Total A for coming EoB, LE management to floor and trunk to upright, pad scoot of hips to EoB  Transfers  Overall transfer level Needs assistance  Transfer via Lift Equipment [place]  Transfers Sit to/from Stand  Sit to Stand Mod assist  General transfer comment modAx2 for L lateral lean, initiates power up assist for steadying  Ambulation/Gait  General Gait Details unable to attempt  Balance  Overall balance assessment Needs assistance  Sitting-balance support Feet supported;Bilateral upper extremity supported  Sitting balance-Leahy Scale Zero  Sitting balance - Comments initially requires max A able to progress to min guard for approx 20 sec and then increased L lateral lean on therapist  Postural control Left lateral lean  Standing balance support Bilateral upper extremity supported  Standing balance-Leahy Scale Zero  Standing balance comment requires outside assist for steadying  General Comments  General comments (skin integrity, edema, etc.) VSS Meal  tray in room, OT attempted to feed, pt with difficulty with liquids and fish, SLP called and in room at end of session to aide in feeding pt lunch  PT - End of Session  Equipment Utilized During Treatment Gait belt  Activity Tolerance Patient limited by fatigue  Patient left in chair;with call bell/phone within reach;with chair alarm set  Nurse Communication Mobility status;Need for lift equipment;Precautions;Weight bearing status  PT Assessment  PT Recommendation/Assessment Patient needs continued PT services  PT Visit Diagnosis Unsteadiness on feet (R26.81);Other abnormalities of gait and mobility (R26.89);Muscle weakness (generalized) (M62.81);Difficulty in walking, not elsewhere classified (R26.2)  PT Problem List Decreased strength;Decreased activity tolerance;Decreased balance;Decreased mobility;Decreased coordination;Decreased cognition;Decreased safety awareness  PT Plan  PT Frequency (ACUTE ONLY) Min 3X/week  PT Treatment/Interventions (ACUTE ONLY) DME instruction;Gait training;Stair training;Functional mobility training;Therapeutic activities;Therapeutic exercise;Balance training;Cognitive remediation;Patient/family education  AM-PAC PT "6 Clicks" Mobility Outcome Measure (Version 2)  Help needed turning from your back to your side while in a flat bed without using bedrails? 1  Help needed moving from lying on your back to sitting on the side of a flat bed without using bedrails? 1  Help needed moving to and from a bed to a chair (including a wheelchair)? 1  Help needed standing up from a chair using your arms (e.g., wheelchair or bedside chair)? 1  Help needed to walk in hospital room? 1  Help needed climbing 3-5 steps with a railing?  1  6 Click Score 6  Consider Recommendation of Discharge To: CIR/SNF/LTACH  PT Recommendation  Follow Up Recommendations CIR  PT equipment Other (comment) (tbd at next venue)  Individuals Consulted  Consulted and Agree with Results and  Recommendations Patient  Acute Rehab PT Goals  Patient Stated Goal none stated  PT Goal Formulation With patient  Time For Goal Achievement 10/15/18  Potential to Achieve Goals Fair  PT Time Calculation  PT Start Time (ACUTE ONLY) 1351  PT Stop Time (ACUTE ONLY) 1433  PT Time Calculation (min) (ACUTE ONLY) 42 min  PT General Charges  $$ ACUTE PT VISIT 1 Visit  PT Evaluation  $PT Eval Moderate Complexity 1 Mod  PT Treatments  $Therapeutic Activity 8-22 mins   Montford Barg B. Migdalia Dk PT, DPT Acute Rehabilitation Services Pager [phone] Office [phone]

## 2018-10-01 NOTE — Evaluation (Signed)
Occupational Therapy Evaluation Patient Details Name: [name] MRN: [id number] DOB: 11-Jun-1956 Today's Date: 10/01/2018    History of Present Illness Pt is a 63 year old woman admitted to [facility] from [facility] on 3/22 with septic shock, AKI, +cdiff, uropathy 2/2 kidney stones. Pt intubated 3/24 to 3/28. PMH: DM, CHF with EF of 45%, afib.    Clinical Impression   Pt reports using a SPC or rollator for ambulation and performing ADL independently prior to admission. She relies on her husband for IADL. Pt presents with profound weakness, requiring 2 person assist for bed mobility and lift equipment (stedy) to transfer safely to chair. Pt with slow response speed, but oriented and following commands. She is able to offer information about her home set up and PLOF. Pt is currently dependent in all ADL. She will need intensive rehab prior to return home. Will follow acutely.    Follow Up Recommendations  CIR;Supervision/Assistance - 24 hour    Equipment Recommendations  3 in 1 bedside commode    Recommendations for Other Services Rehab consult     Precautions / Restrictions Precautions Precautions: Fall Restrictions Weight Bearing Restrictions: No      Mobility Bed Mobility Overal bed mobility: Needs Assistance Bed Mobility: Supine to Sit     Supine to sit: +2 for physical assistance;Total assist     General bed mobility comments: Total A for coming EoB, LE management to floor and trunk to upright, pad scoot of hips to EoB  Transfers Overall transfer level: Needs assistance   Transfers: Sit to/from Stand Sit to Stand: +2 physical assistance;Mod assist         General transfer comment: modAx2 for L lateral lean, initiates power up assist for steadying    Balance Overall balance assessment: Needs assistance Sitting-balance support: Feet supported;Bilateral upper extremity supported Sitting balance-Leahy Scale: Zero Sitting balance - Comments: initially  requires max A able to progress to min guard for approx 20 sec and then increased L lateral lean on therapist Postural control: Left lateral lean Standing balance support: Bilateral upper extremity supported Standing balance-Leahy Scale: Zero Standing balance comment: requires outside assist for steadying                           ADL either performed or assessed with clinical judgement   ADL                                         General ADL Comments: currently requiring total assistance, attempted to self feed     Vision Patient Visual Report: No change from baseline       Perception     Praxis      Pertinent Vitals/Pain Pain Assessment: Faces Faces Pain Scale: Hurts little more Pain Location: generalized with movement Pain Descriptors / Indicators: Grimacing;Guarding Pain Intervention(s): Monitored during session;Repositioned     Hand Dominance Right   Extremity/Trunk Assessment Upper Extremity Assessment Upper Extremity Assessment: Generalized weakness(2+/5 proximally, distal 3/5)   Lower Extremity Assessment Lower Extremity Assessment: Defer to PT evaluation   Cervical / Trunk Assessment Cervical / Trunk Assessment: Kyphotic;Other exceptions(L lateral lean with sitting and standing, weakness)   Communication Communication Communication: Other (comment)(increased time and effort)   Cognition Arousal/Alertness: Awake/alert Behavior During Therapy: Flat affect Overall Cognitive Status: Difficult to assess  General Comments: A&Ox3, provided good home living and prior level of function, slow processing and increased effort for responses   General Comments  VSS Meal tray in room, OT attempted to feed, pt with difficulty with liquids and fish, SLP called and in room at end of session to aide in feeding pt lunch    Exercises     Shoulder Instructions      Home Living Family/patient  expects to be discharged to:: Private residence Living Arrangements: Spouse/significant other Available Help at Discharge: Family;Available 24 hours/day Type of Home: House Home Access: Stairs to enter [employer] of Steps: 2 Entrance Stairs-Rails: Can reach both Home Layout: One level     Bathroom Shower/Tub: Occupational psychologist: Standard     Home Equipment: Cane - single point;Walker - 4 wheels;Shower seat - built in          Prior Functioning/Environment Level of Independence: Needs assistance  Gait / Transfers Assistance Needed: uses cane mostly, sometimes Rollator  ADL's / Homemaking Assistance Needed: able to bathe and dress, husband does cooking and cleaning            OT Problem List: Decreased strength;Decreased activity tolerance;Impaired balance (sitting and/or standing);Decreased coordination;Decreased cognition;Decreased knowledge of use of DME or AE;Impaired UE functional use      OT Treatment/Interventions: Self-care/ADL training;DME and/or AE instruction;Therapeutic exercise;Therapeutic activities;Cognitive remediation/compensation;Balance training;Patient/family education    OT Goals(Current goals can be found in the care plan section) Acute Rehab OT Goals Patient Stated Goal: to eat jello OT Goal Formulation: With patient Time For Goal Achievement: 10/15/18 Potential to Achieve Goals: Good ADL Goals Pt Will Perform Eating: with set-up;with supervision;sitting Pt Will Perform Grooming: with min assist;sitting Pt Will Perform Upper Body Dressing: with min assist;sitting Pt Will Transfer to Toilet: with +2 assist;with min assist;ambulating;bedside commode Pt/caregiver will Perform Home Exercise Program: Increased strength;Both right and left upper extremity;With minimal assist(A/AROM) Additional ADL Goal #1: Pt will demonstrate fair sitting balance at EOB x 10 minutes in preparation for ADL.  OT Frequency: Min 3X/week   Barriers  to D/C:            Co-evaluation PT/OT/SLP Co-Evaluation/Treatment: Yes Reason for Co-Treatment: For patient/therapist safety   OT goals addressed during session: ADL's and self-care      AM-PAC OT "6 Clicks" Daily Activity     Outcome Measure Help from another person eating meals?: Total Help from another person taking care of personal grooming?: Total Help from another person toileting, which includes using toliet, bedpan, or urinal?: Total Help from another person bathing (including washing, rinsing, drying)?: Total Help from another person to put on and taking off regular upper body clothing?: Total Help from another person to put on and taking off regular lower body clothing?: Total 6 Click Score: 6   End of Session Equipment Utilized During Treatment: Gait belt;Oxygen Nurse Communication: Mobility status;Need for lift equipment  Activity Tolerance: Patient tolerated treatment well Patient left: in chair;with call bell/phone within reach;with chair alarm set(ST in room)  OT Visit Diagnosis: Unsteadiness on feet (R26.81);Pain;Other symptoms and signs involving cognitive function;Muscle weakness (generalized) (M62.81)                Time: 1400-1433 OT Time Calculation (min): 33 min Charges:  OT General Charges $OT Visit: 1 Visit OT Evaluation $OT Eval Moderate Complexity: 1 Mod  Nestor Lewandowsky, OTR/L Acute Rehabilitation Services Pager: [phone] Office: [phone]  Malka So 10/01/2018, 3:09 PM

## 2018-10-01 NOTE — Progress Notes (Signed)
PROGRESS NOTE                                                                                                                                                                                                             Patient Demographics:    [name], is a 63 y.o. female, DOB - [birth year], VOZ:[id number]  Admit date - 09/23/2018   Admitting Physician Jamesetta So, MD  Outpatient Primary MD for the patient is [name], [name]., MD  LOS - 8  CC Sepsis transfer from [facility]     Brief Narrative    [name] is a 63 year old female with history of Q2VZ, systolic HF EF 56%, Chr.AF on Eliquis who presents as a transfer from [place] for higher level of care for septic shock.  Admitted with fever, right hydronephrosis/pyelonephritis and septic shock with gram-negative sepsis.  There was some concern for coronavirus infection as she was recently at a wedding with 20 people (unknown if any positive exposures).  She had a flu swab done that was negative.  Covid tested at [facility], she was cleared for it by ID [name] at [facility].  Was treated for gram-negative sepsis and bacteremia, she was seen by urology ID and critical care.  She required endotracheal intubation and mechanical ventilation, she was transferred from ICU to my service on 10/01/2018.  At this time she appears severely intravascularly depleted with sodium of 152, potassium of 2, she is not mentating well and currently is n.p.o.   Subjective:    [name] today in bed somnolent, denies any aches or pains, received Ativan this morning hence has been somnolent since then.   Assessment  & Plan :    Septic shock from GNR bacteremia, hemodynamically improved - blood cultures were positive at [facility] on 09/22/2018, received Rocephin, CT scans and ultrasounds done multiple times final conclusion was patient does not have an active ureteric obstruction or significant hydronephrosis, she was seen by urology on 3/22 and  urology decided no procedures were needed.  She is hemodynamically better and afebrile although she still has significant white count, will continue Rocephin for another 2 days at least and monitor.  Taper off steroids.  Monitor clinically.   Acute hypoxic respiratory failure, R/o LLL atelectasis vs infiltrate/ aspiration - requiring endotracheal intubation and mechanical ventilation under pulmonary critical care along with IV antibiotics.  Has finished antibiotics per critical care on 09/30/2018.  Speech to evaluate, currently n.p.o.  Gentle hydration.  Covid 19 was  ruled out by ID physician Dr. [name] on 09/30/2018.    Diarrhea with leukocytosis and antibiotic exposure.  C. difficile was checked in ICU and it was negative however she continues to have significant diarrhea will repeat C. difficile testing on 10/01/2018 and monitor.  Now IV Flagyl and oral vancomycin till it is ruled out.  Acute kidney Injury, improving, Obstructive uropathy- resolved - AKI is likely multifactorial partly due to obstructive uropathy from kidney stones and partly prerenal due to shock.  With hydration has resolved, seen by urology no intervention needed per urology, repeat imaging for hydronephrosis much improved likely passed stone.  Severe thrombocytopenia, due to sepsis.  Could have had mild DIC.  INR was borderline.  Now with sepsis improved CBC and thrombocytopenia improving continue to monitor.    Chronic atrial fibrillation with [place] vas 2 score of at least 3.  Baseline she was on Coreg, flecainide and Eliquis.  Will resume low-dose beta-blocker once taking oral medications, since platelets low anticoagulation is held for now low-dose heparin.  Monitor.  She did require amiodarone in the ICU which was stopped prior to transfer.  PRN IV Lopressor also ordered.   Chronic systolic Heart failure EF 45%.  Coreg and diuretics held due to sepsis and n.p.o. status, currently intravascularly dehydrated, this is due to  diarrhea.  D5 half-normal given, will monitor.   Concern for COVID 19 - Patient is very low risk. Her low grade fever was due to E. coli bacteremia. Test already obtained at OSH, COVID-19 Isolation was stopped on 3/24.  Appreciate [name] input, note from 3/29 reviewed.   Transaminitis, likely shock liver.  Improving     DM2 - Change sliding scale insulin to standard scale, hold Lantus due to n.p.o. status, Home Januvia, metformin, glipizide on hold  Toxic encephalopathy due to receiving Ativan, Ativan discontinued and added to allergy list, gentle hydration and monitor.  Severe Diarrhea.  Rule out C. difficile.   Family Communication  :  None  Code Status :  Full  Disposition Plan  : SNF  Consults  :  PCCM, ID,   Procedures  :    3/21 R IJ CVC >> 3/22 R radial Aline >> 3/24 ETT >> 3/24 foley >> 3/29>> Rectal tube  CT abdomen and pelvis 3/20 1-24mm right UPJ stone with moderate right hydronephrosis and prominent right perinephrid edema 24mm non obstructing stone upper pole right kidney  Abdominal Ultrasound   Probable fatty infiltration of liver  Moderate hydronephrosis   Echo 08/2018 1. The left ventricle has decreased systolic function with an ejection fraction of 40-45%. The cavity size was normal. There is moderate to severe concentric left ventricular hypertrophy. 2. The right ventricle has normal systolic function. The cavity was normal. There is no increase in right ventricular wall thickness. 3. The tricuspid valve is normal in structure. 4. The pulmonic valve was normal in structure. 5. Right atrial pressure is estimated at 3 mmHg. 6. The aortic root, ascending aorta and aortic arch are normal in size and structure. 7. The aortic valve is tricuspid. 8. The mitral valve is normal in structure with mild MR  Repeat renal ultrasound 3/22>> resolved hydronephrosis  CT abd / pelvis 3/25 >> no evidence of bowel wall thickening, distention,  inflammatory changes.  Stomach normal.  Mild right hydronephrosis with a 1 to 2 mm nonobstructing right renal stone.  The previously seen UPJ calculus is no longer present   DVT Prophylaxis  :   Heparin  Lab Results  Component Value Date   PLT 92 (L) 10/01/2018    Diet :  Diet Order            Diet NPO time specified  Diet effective now               Inpatient Medications Scheduled Meds:  heparin injection (subcutaneous)  5,000 Units Subcutaneous Q8H   insulin aspart  0-20 Units Subcutaneous Q4H   insulin aspart  3 Units Subcutaneous Q4H   insulin glargine  10 Units Subcutaneous Daily   metoprolol tartrate  5 mg Intravenous Q8H   pantoprazole sodium  40 mg Per Tube Daily   sertraline  50 mg Oral Daily   vancomycin  125 mg Per Tube QID   Continuous Infusions:  sodium chloride 10 mL/hr at 09/28/18 0400   sodium chloride 10 mL/hr at 09/29/18 1006   cefTRIAXone (ROCEPHIN)  IV     dextrose 125 mL/hr at 10/01/18 7510   metronidazole 500 mg (10/01/18 0823)   potassium chloride 10 mEq (10/01/18 0923)   potassium chloride     PRN Meds:.Place/Maintain arterial line **AND** sodium chloride, albuterol, hydrALAZINE, phenol, sodium chloride flush  Antibiotics  :   Anti-infectives (From admission, onward)   Start     Dose/Rate Route Frequency Ordered Stop   10/01/18 1045  cefTRIAXone (ROCEPHIN) 1 g in sodium chloride 0.9 % 100 mL IVPB     1 g 200 mL/hr over 30 Minutes Intravenous Every 24 hours 10/01/18 1031 10/05/18 1044   10/01/18 0745  vancomycin (VANCOCIN) 50 mg/mL oral solution 125 mg     125 mg Per Tube 4 times daily 10/01/18 0720 10/10/18 2159   10/01/18 0730  metroNIDAZOLE (FLAGYL) IVPB 500 mg     500 mg 100 mL/hr over 60 Minutes Intravenous Every 8 hours 10/01/18 0719     09/26/18 2100  cefTRIAXone (ROCEPHIN) 2 g in sodium chloride 0.9 % 100 mL IVPB  Status:  Discontinued     2 g 200 mL/hr over 30 Minutes Intravenous Every 24 hours 09/26/18 1210  09/30/18 1511   09/26/18 1900  vancomycin (VANCOCIN) 50 mg/mL oral solution 500 mg  Status:  Discontinued     500 mg Per Tube Every 6 hours 09/26/18 1432 09/27/18 1613   09/25/18 1930  vancomycin (VANCOCIN) 50 mg/mL oral solution 125 mg  Status:  Discontinued     125 mg Per Tube 4 times daily 09/25/18 1927 09/26/18 1432   09/25/18 1800  ceFAZolin (ANCEF) IVPB 2g/100 mL premix  Status:  Discontinued     2 g 200 mL/hr over 30 Minutes Intravenous Every 12 hours 09/25/18 1037 09/25/18 1643   09/25/18 1800  vancomycin (VANCOCIN) 50 mg/mL oral solution 125 mg  Status:  Discontinued     125 mg Per Tube 4 times daily 09/25/18 1643 09/25/18 1927   09/25/18 1730  piperacillin-tazobactam (ZOSYN) IVPB 2.25 g  Status:  Discontinued     2.25 g 100 mL/hr over 30 Minutes Intravenous Every 6 hours 09/25/18 1650 09/26/18 1210   09/25/18 0600  piperacillin-tazobactam (ZOSYN) IVPB 2.25 g  Status:  Discontinued     2.25 g 100 mL/hr over 30 Minutes Intravenous Every 6 hours 09/24/18 2155 09/25/18 1037   09/24/18 2000  piperacillin-tazobactam (ZOSYN) IVPB 3.375 g  Status:  Discontinued     3.375 g 12.5 mL/hr over 240 Minutes Intravenous Every 8 hours 09/24/18 1454 09/24/18 2155   09/23/18 1200  piperacillin-tazobactam (ZOSYN) IVPB 2.25 g  Status:  Discontinued     2.25 g 100 mL/hr over 30 Minutes Intravenous Every 6 hours 09/23/18 0945 09/24/18 1454   09/23/18 1000  vancomycin (VANCOCIN) 50 mg/mL oral solution 125 mg  Status:  Discontinued     125 mg Oral 4 times daily 09/23/18 0152 09/25/18 1643   09/23/18 0600  piperacillin-tazobactam (ZOSYN) IVPB 3.375 g  Status:  Discontinued     3.375 g 100 mL/hr over 30 Minutes Intravenous Every 8 hours 09/23/18 0152 09/23/18 0155   09/23/18 0600  piperacillin-tazobactam (ZOSYN) IVPB 3.375 g  Status:  Discontinued     3.375 g 12.5 mL/hr over 240 Minutes Intravenous Every 8 hours 09/23/18 0156 09/23/18 0945        Objective:   Vitals:   09/30/18 1200 09/30/18 1300  09/30/18 1824 09/30/18 2003  BP: 138/81 (!) 142/87 (!) 160/85   Pulse: 73 83 77   Resp: 17 (!) 21 (!) 21   Temp:   97.6 F (36.4 C) 97.8 F (36.6 C)  TempSrc:   Oral   SpO2: 99% 99% 97%   Weight:      Height:        Wt Readings from Last 3 Encounters:  09/30/18 75.8 kg  08/20/18 70.3 kg  07/26/18 72.4 kg     Intake/Output Summary (Last 24 hours) at 10/01/2018 1031 Last data filed at 10/01/2018 0859 Gross per 24 hour  Intake 30 ml  Output 2600 ml  Net -2570 ml     Physical Exam  Awake Alert, Oriented X 3, No new F.N deficits, Normal affect [place].AT,PERRAL Supple Neck,No JVD, No cervical lymphadenopathy appriciated.  Symmetrical Chest wall movement, Good air movement bilaterally, CTAB RRR,No Gallops,Rubs or new Murmurs, No Parasternal Heave +ve B.Sounds, Abd Soft, No tenderness, No organomegaly appriciated, No rebound - guarding or rigidity. No Cyanosis, Clubbing or edema, No new Rash or bruise     Data Review:    CBC  Recent Labs  Lab 09/26/18 0432  09/26/18 1244  09/27/18 0323 09/27/18 0446 09/28/18 0359 09/29/18 0422 10/01/18 0538  WBC 51.0*  --   --   --   --  29.3* 28.6* 28.7* 44.1*  HGB 8.3*   < >  --    < > 9.5* 7.6* 7.9* 7.9* 8.3*  HCT 27.4*   < >  --    < > 28.0* 25.0* 25.2* 25.4* 26.7*  PLT 67*  --  71*  --   --  63* 67* 69* 92*  MCV 96.5  --   --   --   --  94.3 92.6 93.0 95.4  MCH 29.2  --   --   --   --  28.7 29.0 28.9 29.6  MCHC 30.3  --   --   --   --  30.4 31.3 31.1 31.1  RDW 16.5*  --   --   --   --  16.5* 16.9* 16.9* 17.4*  LYMPHSABS  --   --   --   --   --   --  1.1  --   --   MONOABS  --   --   --   --   --   --  1.1*  --   --   EOSABS  --   --   --   --   --   --  0.0  --   --   BASOSABS  --   --   --   --   --   --  0.0  --   --    < > = values in this interval not displayed.    Chemistries   Recent Labs  Lab 09/24/18 1732  09/26/18 0432  09/26/18 1244  09/27/18 0323 09/27/18 0446 09/28/18 0359 09/29/18 0422 10/01/18 0538  10/01/18 0832  NA 138   < > 140   < >  --    < > 141 140 143 146* 157*  --   K 3.8   < > 3.7   < >  --    < > 3.4* 3.1* 3.8 3.7 2.2*  --   CL 99   < > 104  --   --   --   --  105 106 114* 125*  --   CO2 23   < > 18*  --   --   --   --  19* 23 21* 26  --   GLUCOSE 128*   < > 163*  --   --   --   --  158* 304* 347* 119*  --   BUN 55*   < > 73*  --   --   --   --  81* 93* 109* 85*  --   CREATININE 2.91*   < > 3.32*  --   --   --   --  3.22* 2.95* 2.78* 1.59*  --   CALCIUM 6.6*   < > 7.1*  --   --   --   --  7.8* 8.6* 8.7* 7.6*  --   MG 2.2  --  2.6*  --   --   --   --  2.5*  --  2.4  --  2.0  AST  --    < > 496*  --  459*  --   --  226*  --  217* 49*  --   ALT  --    < > 549*  --  498*  --   --  384*  --  346* 179*  --   ALKPHOS  --    < > 200*  --  194*  --   --  177*  --  358* 257*  --   BILITOT  --    < > 2.7*  --  2.5*  --   --  2.8*  --  2.8* 2.0*  --    < > = values in this interval not displayed.   ------------------------------------------------------------------------------------------------------------------ No results for input(s): CHOL, HDL, LDLCALC, TRIG, CHOLHDL, LDLDIRECT in the last 72 hours.  Lab Results  Component Value Date   HGBA1C 6.4 (H) 05/08/2013   ------------------------------------------------------------------------------------------------------------------ No results for input(s): TSH, T4TOTAL, T3FREE, THYROIDAB in the last 72 hours.  Invalid input(s): FREET3 ------------------------------------------------------------------------------------------------------------------ No results for input(s): VITAMINB12, FOLATE, FERRITIN, TIBC, IRON, RETICCTPCT in the last 72 hours.  Coagulation profile Recent Labs  Lab 09/24/18 1732 09/26/18 1244 10/01/18 0538  INR 1.7* 1.7* 1.5*    No results for input(s): DDIMER in the last 72 hours.  Cardiac Enzymes No results for input(s): CKMB, TROPONINI, MYOGLOBIN in the last 168 hours.  Invalid input(s):  CK ------------------------------------------------------------------------------------------------------------------    Component Value Date/Time   BNP 2,948.4 (H) 10/01/2018 6659    Micro Results Recent Results (from the past 240 hour(s))  MRSA PCR Screening     Status: None   Collection Time: 09/23/18  1:24 AM  Result Value Ref Range Status   MRSA by PCR NEGATIVE NEGATIVE Final  Comment:        The GeneXpert MRSA Assay (FDA approved for NASAL specimens only), is one component of a comprehensive MRSA colonization surveillance program. It is not intended to diagnose MRSA infection nor to guide or monitor treatment for MRSA infections. Performed at [facility], [place] [address], [place], [place] [address]   Culture, blood (routine x 2)     Status: None   Collection Time: 09/23/18  6:03 AM  Result Value Ref Range Status   Specimen Description BLOOD RIGHT ANTECUBITAL  Final   Special Requests   Final    BOTTLES DRAWN AEROBIC ONLY Blood Culture adequate volume   Culture   Final    NO GROWTH 5 DAYS Performed at [facility], [place] [address] [place], [place] [address]    Report Status 09/28/2018 FINAL  Final  C difficile quick scan w PCR reflex     Status: None   Collection Time: 09/27/18 10:48 AM  Result Value Ref Range Status   C Diff antigen NEGATIVE NEGATIVE Final   C Diff toxin NEGATIVE NEGATIVE Final   C Diff interpretation No C. difficile detected.  Final    Comment: Performed at [facility], [place] [address] [place],  [address]    Radiology Reports Ct Abdomen Pelvis Wo Contrast  Result Date: 09/26/2018 CLINICAL DATA:  Sepsis EXAM: CT ABDOMEN AND PELVIS WITHOUT CONTRAST TECHNIQUE: Multidetector CT imaging of the abdomen and pelvis was performed following the standard protocol without IV contrast. COMPARISON:  09/21/2018 FINDINGS: Lower chest: Small bilateral pleural effusions. Bibasilar airspace disease. Hepatobiliary: No focal liver  abnormality is seen. Contrast fills the entirety of the gallbladder. No gallstones, gallbladder wall thickening, or biliary dilatation. Small amount of perihepatic ascites. Pancreas: Unremarkable. No pancreatic ductal dilatation or surrounding inflammatory changes. Spleen: Normal in size without focal abnormality. Adrenals/Urinary Tract: Adrenal glands are unremarkable. Mild right hydronephrosis. 1-2 mm nonobstructing right renal calculus. Previously seen right UPJ calculus is not seen on the current exam. No other ureteral calculi. No bladder calculi. Foley catheter within the bladder. Stomach/Bowel: Stomach is within normal limits. No evidence of bowel wall thickening, distention, or inflammatory changes. Rectal drainage tube is present. Vascular/Lymphatic: Abdominal aortic atherosclerosis. No lymphadenopathy. Reproductive: Uterus and bilateral adnexa are unremarkable. Other: Mild anasarca. Small amount pelvic free fluid. No drainable fluid collection. Musculoskeletal: No acute osseous abnormality. No aggressive osseous lesion. Posterior lumbar fusion and decompression from T10 through S1. Mild osteoarthritis of bilateral sacroiliac joints. IMPRESSION: 1. Mild right hydronephrosis. 1-2 mm nonobstructing right renal calculus. Previously seen right UPJ calculus is not seen on the current exam. No other ureteral calculi. 2. Mild anasarca 3. Small bilateral pleural effusions. Bibasilar airspace disease which may reflect atelectasis versus pneumonia. Electronically Signed   By: Kathreen Devoid   On: 09/26/2018 18:56   Dg Abd 1 View  Result Date: 09/25/2018 CLINICAL DATA:  63 year old female status post intubation for airway protection. Acute renal failure. EXAM: ABDOMEN - 1 VIEW COMPARISON:  Portable chest 1700 hours today. CT Abdomen and Pelvis 09/21/2018. FINDINGS: Portable AP semi upright view at 1715 hours. Enteric tube courses to the left abdomen with side hole at the level of the gastric body. Stable confluent  opacity at the left lung base. Paucity of bowel gas. Calcified aortic atherosclerosis. Extensive thoracic and lumbar posterior spinal fusion hardware again noted. IMPRESSION: Enteric tube placed, side hole at the level of the gastric body. Paucity of bowel gas. Electronically Signed   By:  Genevie Ann M.D.   On: 09/25/2018 19:59   US Renal  Result Date: 09/23/2018 CLINICAL DATA:  Right sided hydronephrosis. EXAM: RENAL / URINARY TRACT ULTRASOUND COMPLETE COMPARISON:  CT 09/21/2018 and right upper quadrant ultrasound 09/21/2018 FINDINGS: Right Kidney: Renal measurements: 5.0 x 5.1 x 12.7 cm = volume: 167 mL . Echogenicity within normal limits. Prominence of the right renal pelvis without significant hydronephrosis. No focal mass. Left Kidney: Renal measurements: 5.1 x 5.7 x 11.6 cm = volume: 176 mL. Echogenicity within normal limits. Minimal fullness of the left renal pelvis. No focal mass. Bladder: Bladder is contracted. Small amount of free perihepatic fluid is visualized. IMPRESSION: Normal size kidneys. Prominence of the right renal pelvis without significant hydronephrosis. Minimal fullness of the left renal pelvis. Small amount of perihepatic fluid. Electronically Signed   By: Marin Olp M.D.   On: 09/23/2018 10:29   Dg Chest Port 1 View  Result Date: 10/01/2018 CLINICAL DATA:  Patient admitted 09/23/2018 and septic shock. EXAM: PORTABLE CHEST 1 VIEW COMPARISON:  Single-view of the chest 09/29/2018 and 09/27/2018. FINDINGS: Endotracheal tube and NG tube have been removed. Right IJ approach central venous catheter remains in place. Small left effusion and airspace disease in the left mid and lower lung zones has improved. There is increased right basilar atelectasis. Heart size is upper normal. No pneumothorax. IMPRESSION: Increased right basilar atelectasis. Improved left pleural effusion and basilar airspace disease. Electronically Signed   By: Inge Rise M.D.   On: 10/01/2018 07:36   Dg Chest  Port 1 View  Result Date: 09/29/2018 CLINICAL DATA:  Acute respiratory failure with hypoxia EXAM: PORTABLE CHEST 1 VIEW COMPARISON:  09/27/2018 FINDINGS: Endotracheal tube terminates 4 cm above the carina. Small layering left pleural effusion. Right lung is essentially clear, with right lower lobe scarring/atelectasis. Left lower lobe opacity, likely atelectasis. No pneumothorax. Cardiomegaly.  Thoracic aortic atherosclerosis. Enteric tube courses into the mid gastric body. Lower thoracic/upper lumbar spine fixation hardware, incompletely visualized. Right IJ venous catheter terminates at the cavoatrial junction. IMPRESSION: Endotracheal tube terminates 4 cm above the carina. Additional support apparatus as above. Small layering left pleural effusion. Left lower lobe opacity, likely atelectasis. Electronically Signed   By: Julian Hy M.D.   On: 09/29/2018 07:29   Dg Chest Port 1 View  Result Date: 09/27/2018 CLINICAL DATA:  Respiratory failure. EXAM: PORTABLE CHEST 1 VIEW COMPARISON:  09/26/2018 FINDINGS: Endotracheal tube terminates 3 cm above the carina. Enteric tube courses into the stomach. Right jugular catheter terminates over the lower SVC. The cardiomediastinal silhouette is unchanged. Left basilar consolidation has mildly improved. There is a persistent small left pleural effusion. Mild atelectasis is noted in the right lung base. No pneumothorax is identified. IMPRESSION: Mildly improved left basilar aeration. Persistent small left pleural effusion. Electronically Signed   By: Logan Bores M.D.   On: 09/27/2018 08:18   Dg Chest Port 1 View  Result Date: 09/26/2018 CLINICAL DATA:  Hypoxia EXAM: PORTABLE CHEST 1 VIEW COMPARISON:  September 25, 2018 FINDINGS: Endotracheal tube tip is 1.9 cm above the carina. Nasogastric tube tip and side port below the diaphragm. Central catheter tip is at cavoatrial junction. No pneumothorax. There is persistent airspace consolidation in the left lower lobe.  There are small pleural effusions bilaterally. There is no consolidation on the right. Heart size and pulmonary vascularity within normal limits. No adenopathy. There is aortic atherosclerosis. There is postoperative change in the lower thoracic and visualized lumbar regions. There are surgical clips in  the left breast region. IMPRESSION: Tube and catheter positions as described without evident pneumothorax. Consolidation left lower lobe, likely due to a combination of atelectasis and pneumonia or possibly aspiration. There are small pleural effusions bilaterally. Stable cardiac silhouette. Aortic Atherosclerosis (ICD10-I70.0). Appearance stable compared to 1 day prior. Electronically Signed   By: Lowella Grip III M.D.   On: 09/26/2018 07:46   Dg Chest Port 1 View  Result Date: 09/25/2018 CLINICAL DATA:  Respiratory failure requiring intubation. EXAM: PORTABLE CHEST 1 VIEW COMPARISON:  Radiograph of same day. FINDINGS: Stable cardiomediastinal silhouette. Atherosclerosis of thoracic aorta is noted. Endotracheal tube is seen projected over tracheal air shadow with distal tip 1 cm above the carina. Nasogastric tube is seen entering stomach. Right internal jugular catheter is noted with distal tip in expected position of cavoatrial junction. No pneumothorax is noted. Mild left pleural effusion is noted with associated atelectasis or infiltrate. Minimal right basilar subsegmental atelectasis is noted. Bony thorax is unremarkable. IMPRESSION: Endotracheal and nasogastric tubes are in grossly good position. Mild left pleural effusion is noted with associated atelectasis or infiltrate. Mild right basilar subsegmental atelectasis. Right internal jugular catheter in grossly good position. Electronically Signed   By: Marijo Conception, M.D.   On: 09/25/2018 18:36   Dg Chest Port 1 View  Result Date: 09/25/2018 CLINICAL DATA:  Altered mental status. EXAM: PORTABLE CHEST 1 VIEW COMPARISON:  09/23/2018 FINDINGS: Mild  enlargement of the cardiopericardial silhouette, stable. No mediastinal or hilar masses. There is opacity in the left lung base obscuring hemidiaphragm and partly silhouetting the left heart border, associated with air bronchograms. There is bilateral interstitial thickening greater in the lower lungs, superimposed on chronically prominent bronchovascular markings. Hazy opacities noted at the right lung base consistent with a small pleural effusion. Probable left pleural effusion. No pneumothorax. Right internal jugular central venous line is stable from the prior exam. IMPRESSION: 1. Worsened lung aeration compared to the prior study. There is increased opacity at the left lung base consistent with atelectasis or pneumonia. There also small pleural effusions as well as lower lung zone interstitial thickening. A component of congestive heart failure with interstitial edema is suspected. 2. Mild stable cardiomegaly. Electronically Signed   By: Lajean Manes M.D.   On: 09/25/2018 16:11   Dg Chest Port 1 View  Result Date: 09/23/2018 CLINICAL DATA:  Shortness of breath and cough. EXAM: PORTABLE CHEST 1 VIEW COMPARISON:  09/22/2018 FINDINGS: Stable position of the right IJ approach central venous catheter. Cardiomediastinal silhouette is enlarged. Calcific atherosclerotic disease of the aorta. Mediastinal contours appear intact. Bilateral lower lobe peribronchial thickening. Osseous structures are without acute abnormality. Partially visualized spinal fusion. Soft tissues are grossly normal. IMPRESSION: Bilateral lower lobe linear peribronchial airspace consolidation which may represent bronchitis or early bronchopneumonia. Electronically Signed   By: Fidela Salisbury M.D.   On: 09/23/2018 02:34   Dg Abd Portable 1v  Result Date: 10/01/2018 CLINICAL DATA:  63 year old female with C difficile colitis EXAM: PORTABLE ABDOMEN - 1 VIEW COMPARISON:  10/01/2018, 09/29/2018, CT abdomen 09/26/2018 FINDINGS: Gas within  stomach, small bowel, colon. No abnormal distention. No significant formed stool burden. No unexpected radiopaque foreign body, soft tissue density, or calcification. Surgical changes of the thoracolumbar spine. Pelvic phleboliths. IMPRESSION: No acute plain film finding, with nonspecific bowel gas pattern. Electronically Signed   By: Corrie Mckusick D.O.   On: 10/01/2018 09:39    Time Spent in minutes  30   Lala Lund M.D on 10/01/2018 at 10:31  AM  To page go to [email or web address] - password [facility]

## 2018-10-02 DIAGNOSIS — R1310 Dysphagia, unspecified: Secondary | ICD-10-CM

## 2018-10-02 DIAGNOSIS — I10 Essential (primary) hypertension: Secondary | ICD-10-CM

## 2018-10-02 DIAGNOSIS — E669 Obesity, unspecified: Secondary | ICD-10-CM

## 2018-10-02 DIAGNOSIS — B999 Unspecified infectious disease: Secondary | ICD-10-CM

## 2018-10-02 DIAGNOSIS — A0472 Enterocolitis due to Clostridium difficile, not specified as recurrent: Secondary | ICD-10-CM

## 2018-10-02 DIAGNOSIS — I480 Paroxysmal atrial fibrillation: Secondary | ICD-10-CM

## 2018-10-02 DIAGNOSIS — E1169 Type 2 diabetes mellitus with other specified complication: Secondary | ICD-10-CM

## 2018-10-02 DIAGNOSIS — N390 Urinary tract infection, site not specified: Secondary | ICD-10-CM

## 2018-10-02 DIAGNOSIS — I5022 Chronic systolic (congestive) heart failure: Secondary | ICD-10-CM

## 2018-10-02 DIAGNOSIS — R0682 Tachypnea, not elsewhere classified: Secondary | ICD-10-CM

## 2018-10-02 DIAGNOSIS — E119 Type 2 diabetes mellitus without complications: Secondary | ICD-10-CM

## 2018-10-02 LAB — CBC
HCT: 27.6 % — ABNORMAL LOW (ref 36.0–46.0)
Hemoglobin: 8.6 g/dL — ABNORMAL LOW (ref 12.0–15.0)
MCH: 29.4 pg (ref 26.0–34.0)
MCHC: 31.2 g/dL (ref 30.0–36.0)
MCV: 94.2 fL (ref 80.0–100.0)
Platelets: 104 10*3/uL — ABNORMAL LOW (ref 150–400)
RBC: 2.93 MIL/uL — ABNORMAL LOW (ref 3.87–5.11)
RDW: 17.9 % — ABNORMAL HIGH (ref 11.5–15.5)
WBC: 32 10*3/uL — ABNORMAL HIGH (ref 4.0–10.5)
nRBC: 0.6 % — ABNORMAL HIGH (ref 0.0–0.2)

## 2018-10-02 LAB — COMPREHENSIVE METABOLIC PANEL
ALT: 169 U/L — ABNORMAL HIGH (ref 0–44)
ANION GAP: 9 (ref 5–15)
AST: 47 U/L — ABNORMAL HIGH (ref 15–41)
Albumin: 2.5 g/dL — ABNORMAL LOW (ref 3.5–5.0)
Alkaline Phosphatase: 311 U/L — ABNORMAL HIGH (ref 38–126)
BUN: 69 mg/dL — ABNORMAL HIGH (ref 8–23)
CHLORIDE: 108 mmol/L (ref 98–111)
CO2: 32 mmol/L (ref 22–32)
Calcium: 8.9 mg/dL (ref 8.9–10.3)
Creatinine, Ser: 1.42 mg/dL — ABNORMAL HIGH (ref 0.44–1.00)
GFR calc Af Amer: 45 mL/min — ABNORMAL LOW (ref >60)
GFR calc non Af Amer: 39 mL/min — ABNORMAL LOW (ref >60)
Glucose, Bld: 207 mg/dL — ABNORMAL HIGH (ref 70–99)
Potassium: 2.7 mmol/L — CL (ref 3.5–5.1)
SODIUM: 149 mmol/L — AB (ref 135–145)
Total Bilirubin: 2 mg/dL — ABNORMAL HIGH (ref 0.3–1.2)
Total Protein: 6.1 g/dL — ABNORMAL LOW (ref 6.5–8.1)

## 2018-10-02 LAB — GLUCOSE, CAPILLARY
Glucose-Capillary: 224 mg/dL — ABNORMAL HIGH (ref 70–99)
Glucose-Capillary: 113 mg/dL — ABNORMAL HIGH (ref 70–99)
Glucose-Capillary: 225 mg/dL — ABNORMAL HIGH (ref 70–99)
Glucose-Capillary: 259 mg/dL — ABNORMAL HIGH (ref 70–99)
Glucose-Capillary: 295 mg/dL — ABNORMAL HIGH (ref 70–99)

## 2018-10-02 LAB — GASTROINTESTINAL PANEL BY PCR, STOOL (REPLACES STOOL CULTURE)

## 2018-10-02 LAB — MAGNESIUM: Magnesium: 1.7 mg/dL (ref 1.7–2.4)

## 2018-10-02 LAB — POTASSIUM: Potassium: 3.9 mmol/L (ref 3.5–5.1)

## 2018-10-02 MED ORDER — POTASSIUM CHLORIDE 2 MEQ/ML IV SOLN
INTRAVENOUS | Status: DC
  Administered 2018-10-02 (×2): via INTRAVENOUS
  Filled 2018-10-02 (×3): qty 1000

## 2018-10-02 MED ORDER — POTASSIUM CHLORIDE CRYS ER 20 MEQ PO TBCR
20.0000 meq | EXTENDED_RELEASE_TABLET | Freq: Once | ORAL | Status: AC
  Administered 2018-10-02: 20 meq via ORAL
  Filled 2018-10-02: qty 1

## 2018-10-02 MED ORDER — POTASSIUM CHLORIDE 10 MEQ/100ML IV SOLN
10.0000 meq | INTRAVENOUS | Status: AC
  Administered 2018-10-02 (×2): 10 meq via INTRAVENOUS
  Filled 2018-10-02: qty 100

## 2018-10-02 MED ORDER — POTASSIUM CHLORIDE 10 MEQ/100ML IV SOLN
10.0000 meq | INTRAVENOUS | Status: AC
  Administered 2018-10-02 (×4): 10 meq via INTRAVENOUS
  Filled 2018-10-02 (×3): qty 100

## 2018-10-02 MED ORDER — POTASSIUM CHLORIDE CRYS ER 20 MEQ PO TBCR
40.0000 meq | EXTENDED_RELEASE_TABLET | Freq: Once | ORAL | Status: AC
  Administered 2018-10-02: 40 meq via ORAL
  Filled 2018-10-02: qty 2

## 2018-10-02 MED ORDER — MAGNESIUM SULFATE 2 GM/50ML IV SOLN
2.0000 g | Freq: Once | INTRAVENOUS | Status: AC
  Administered 2018-10-02: 2 g via INTRAVENOUS
  Filled 2018-10-02: qty 50

## 2018-10-02 MED ORDER — ONDANSETRON HCL 4 MG/2ML IJ SOLN
4.0000 mg | Freq: Four times a day (QID) | INTRAMUSCULAR | Status: DC | PRN
  Administered 2018-10-02: 4 mg via INTRAVENOUS
  Filled 2018-10-02: qty 2

## 2018-10-02 MED ORDER — LOPERAMIDE HCL 2 MG PO CAPS
4.0000 mg | ORAL_CAPSULE | Freq: Four times a day (QID) | ORAL | Status: AC
  Administered 2018-10-02 (×2): 4 mg via ORAL
  Filled 2018-10-02 (×2): qty 2

## 2018-10-02 MED ORDER — POTASSIUM CHLORIDE 10 MEQ/100ML IV SOLN
INTRAVENOUS | Status: AC
  Filled 2018-10-02: qty 100

## 2018-10-02 MED ORDER — MAGNESIUM SULFATE IN D5W 1-5 GM/100ML-% IV SOLN
1.0000 g | Freq: Once | INTRAVENOUS | Status: AC
  Administered 2018-10-02: 1 g via INTRAVENOUS
  Filled 2018-10-02: qty 100

## 2018-10-02 MED ORDER — LOPERAMIDE HCL 2 MG PO CAPS
4.0000 mg | ORAL_CAPSULE | Freq: Three times a day (TID) | ORAL | Status: DC | PRN
  Filled 2018-10-02: qty 2

## 2018-10-02 MED ORDER — LOPERAMIDE HCL 2 MG PO CAPS: 4.0000 mg | ORAL_CAPSULE | ORAL | Status: DC | PRN

## 2018-10-02 MED ORDER — ENSURE ENLIVE PO LIQD
237.0000 mL | Freq: Three times a day (TID) | ORAL | Status: DC
  Administered 2018-10-02 – 2018-10-04 (×5): 237 mL via ORAL

## 2018-10-02 NOTE — Progress Notes (Signed)
Physical Therapy Treatment Patient Details Name: [name] MRN: [id number] DOB: June 04, 1956 Today's Date: 10/02/2018    History of Present Illness [name] is a 63 year old woman admitted to [facility] from [facility] on 3/22 with septic shock, AKI, +cdiff, uropathy 2/2 kidney stones. [name] intubated 3/24 to 3/28. PMH: DM, CHF with EF of 45%, afib.     [name] Comments    [name] towards her goals today, [name] and was Alexandra to participate with therapy this afternoon. [name] requires modA for bed mobility and minA for [name] in [place], [name] [name] with min guard assist. [name] to improve strength and mobility. [name].   Follow Up Recommendations  [name]     Equipment Recommendations  Other (comment)(tbd at next venue)       Precautions / Restrictions Precautions Precautions: Fall Restrictions Weight Bearing Restrictions: No    Mobility  Bed Mobility Overal bed mobility: Needs Assistance Bed Mobility: Supine to Sit     Supine to sit: Mod assist     General bed mobility comments: modA for moving LE across bed and trunk to upright, vc for hand placement on bed rail to pull to EoB,  Transfers Overall transfer level: Needs assistance   Transfers: Sit to/from Stand Sit to Stand: Min assist;Min guard         General transfer comment: minA for initial power up to Stedy, and min guard for 5x [name] from higher Stedy pads.  Ambulation/Gait             General Gait Details: unable to attempt       Balance Overall balance assessment: Needs assistance Sitting-balance support: Feet supported;Bilateral upper extremity supported Sitting balance-Leahy Scale: Poor Sitting balance - Comments: requires min A for maintaining initial balance Alexandra to [name] to supervision with B UE support on bed Postural control: Left lateral lean Standing balance  support: Bilateral upper extremity supported Standing balance-Leahy Scale: Poor Standing balance comment: Alexandra to statically stand with hands on Stedy                            Cognition Arousal/Alertness: Awake/alert Behavior During Therapy: Flat affect Overall Cognitive Status: Within Functional Limits for tasks assessed                                 General Comments: [name] Alexandra to smile today with joking, continues to require extra time for processing      Exercises      General Comments General comments (skin integrity, edema, etc.): VSS, Improved participation in therapy today.      Pertinent Vitals/Pain Pain Assessment: 0-10 Faces Pain Scale: Hurts even more Pain Location: generalized with movement Pain Descriptors / Indicators: Grimacing;Guarding Pain Intervention(s): Limited activity within patient's tolerance;Monitored during session;Repositioned           [name] Goals (current goals can now be found in the care plan section) Acute Rehab [name] Goals Patient Stated Goal: none stated [name] Goal Formulation: With patient Time For Goal Achievement: 10/15/18 Potential to Achieve Goals: Fair    Frequency    Min 3X/week      [name] Plan Current plan remains appropriate       AM-PAC [name] "6 Clicks" Mobility   Outcome Measure  Help needed turning from your back to your side while in a flat bed without using bedrails?: Total Help needed moving from lying on your back to sitting on the side of a flat bed without using bedrails?: Total Help needed moving to and from a bed to a chair (including a wheelchair)?: Total Help needed standing up from a chair using your arms (e.g., wheelchair or bedside chair)?: Total Help needed to walk in hospital room?: Total Help needed climbing 3-5 steps with a railing? : Total 6 Click Score: 6    End of Session Equipment Utilized During Treatment: Gait belt Activity Tolerance: Patient limited by fatigue Patient  left: in chair;with call bell/phone within reach;with chair alarm set Nurse Communication: Mobility status;Need for lift equipment;Precautions;Weight bearing status [name] Visit Diagnosis: Unsteadiness on feet (R26.81);Other abnormalities of gait and mobility (R26.89);Muscle weakness (generalized) (M62.81);Difficulty in walking, not elsewhere classified (R26.2)     Time: 3007-6226 [name] Time Calculation (min) (ACUTE ONLY): 34 min  Charges:  $Therapeutic Exercise: 23-37 mins                     [name] B. Migdalia Dk [name], DPT Acute Rehabilitation Services Pager [phone] Office [phone]    [place] 10/02/2018, 5:50 PM

## 2018-10-02 NOTE — Progress Notes (Signed)
Inpatient Diabetes Program Recommendations  AACE/ADA: New Consensus Statement on Inpatient Glycemic Control (2015)  Target Ranges:  Prepandial:   less than 140 mg/dL      Peak postprandial:   less than 180 mg/dL (1-2 hours)      Critically ill patients:  140 - 180 mg/dL   Lab Results  Component Value Date   GLUCAP 225 (H) 10/02/2018   HGBA1C 6.4 (H) 05/08/2013     Results for SHAENA, PARKERSON (MRN [id number]) as of 10/02/2018 13:28  Ref. Range 10/01/2018 08:15 10/01/2018 12:03 10/01/2018 16:53 10/01/2018 20:24 10/02/2018 01:32 10/02/2018 0353 10/02/2018 07:43 10/02/2018 12:14  Glucose-Capillary Latest Ref Range: 70 - 99 mg/dL 117 (H) 180 (H)  7 units Novolog ( 4 units correction + 3 units tf coverage) 337 (H)  18 units Novolog (15 units correction + 3 units tf coverage) 282 (H)  14 units Novolog (11 units correction + 3 units tf coverage) 224 (H)  10 units Novolog (7 units correction + 3 units tf coverage) 207 (lab draw)   10 units Novolog  (7 units correction + 3 units tf coverage) 113 (H) 225 (H)   10 units Novolog (7 units correction + 3 units tf coverage)      Review of Glycemic Control  Diabetes history: DM2  Outpatient Diabetes medications: Glipizide 10 mg Daily, Metformin 1000 mg BID, Januvia 100 mg Daily  Current orders for Inpatient glycemic control: Novolog resistant correction scale (0-20 units) Q4 hours                                                                          Novolog 3 units Q4 hours (was from tube feeding coverage)    Last dose of iv steroids was 3/29 0800. Tube feeding stopped 3/28. Patient on D2 diet as of yesterday. Spoke to bedside RN and patient is just taking bites of food (estimated 25% of breakfast and lunch).    Patient had Lantus 15 units ordered previously and last dose given prior to med being discontinued was 3/29 at 1127. Blood glucose values were more at goal range while receiving basal insulin.   Patient has had a total of 69 units  of Novolog insulin in the past 24 hours.      MD please consider the following inpatient insulin adjustments:   1. Add Lantus 10 units Q 24 hours  (based on 0.15 units/kg)  2. Discontinue tube feed coverage (no longer has tf and is currently eating 25% or less of meals)  3. Decrease Novolog correction to moderate scale (0-15 units) ac and hs    Thank you.  -- Will follow during hospitalization.--  Jonna Clark RN, MSN Diabetes Coordinator Inpatient Glycemic Control Team Team Pager: [phone] (8am-5pm)

## 2018-10-02 NOTE — Progress Notes (Signed)
PROGRESS NOTE                                                                                                                                                                                                             Patient Demographics:    [name], is a 63 y.o. female, DOB - 12-Apr-1956, ZOX:[id number]  Admit date - 09/23/2018   Admitting Physician Jamesetta So, MD  Outpatient Primary MD for the patient is [name], [name]., MD  LOS - 9  CC Sepsis transfer from [facility]     Brief Narrative    [name] is a 63 year old female with history of W1XB, systolic HF EF 14%, Chr.AF on Eliquis who presents as a transfer from [place] for higher level of care for septic shock.  Admitted with fever, right hydronephrosis/pyelonephritis and septic shock with gram-negative sepsis.  There was some concern for coronavirus infection as she was recently at a wedding with 20 people (unknown if any positive exposures).  She had a flu swab done that was negative.  Covid tested at [facility], she was cleared for it by ID [name] at [facility].  Was treated for gram-negative sepsis and bacteremia, she was seen by urology ID and critical care.  She required endotracheal intubation and mechanical ventilation, she was transferred from ICU to my service on 10/01/2018.  At this time she appears severely intravascularly depleted with sodium of 152, potassium of 2, she is not mentating well and currently is n.p.o.   Subjective:   Patient in bed, appears comfortable, denies any headache, no fever, no chest pain or pressure, no shortness of breath , no abdominal pain. No focal weakness.    Assessment  & Plan :    Septic shock from GNR bacteremia, hemodynamically improved - blood cultures were positive at [facility] on 09/22/2018, received Rocephin, CT scans and ultrasounds done multiple times final conclusion was patient does not have an active ureteric obstruction or significant hydronephrosis, she was  seen by urology on 3/22 and urology decided no procedures were needed.  She is hemodynamically better and afebrile although she still has significant white count, steroids have been tapered off sepsis pathophysiology clinically has resolved, finish Rocephin course in the next 2 days.  Acute hypoxic respiratory failure, R/o LLL atelectasis vs infiltrate/ aspiration - requiring endotracheal intubation and mechanical ventilation under pulmonary critical care along with IV antibiotics.  Has finished antibiotics per critical care on 09/30/2018.  Speech to evaluate, currently n.p.o.  Gentle hydration.  Covid 19 was ruled out by ID physician Dr. [name] on 09/30/2018.   Diarrhea with leukocytosis and antibiotic exposure.  C. difficile has been ruled out x2, Rocephin for #1 above for gram-negative bacteremia due to ureteric stone, leukocytosis improving continue to monitor.  Acute kidney Injury, improving, Obstructive uropathy- resolved - AKI is likely multifactorial partly due to obstructive uropathy from kidney stones and partly prerenal due to shock.  With hydration has resolved, seen by urology no intervention needed per urology, repeat imaging for hydronephrosis much improved likely passed stone.  Severe thrombocytopenia, due to sepsis.  Could have had mild DIC.  INR was borderline.  Now with sepsis improved CBC and thrombocytopenia improving continue to monitor.   Chronic atrial fibrillation with [place] vas 2 score of at least 3.  Baseline she was on Coreg, flecainide and Eliquis.  Will resume low-dose beta-blocker once taking oral medications, since platelets low anticoagulation is held for now low-dose heparin.  Monitor.  She did require amiodarone in the ICU which was stopped prior to transfer.  PRN IV Lopressor also ordered.   Chronic systolic Heart failure EF 45%.  Coreg and diuretics held due to sepsis and n.p.o. status, currently intravascularly dehydrated, this is due to diarrhea.  D5 half-normal  given, will monitor.   Concern for COVID 19 - Patient is very low risk. Her low grade fever was due to E. coli bacteremia. Test already obtained at OSH, COVID-19 Isolation was stopped on 3/24.  Appreciate [name] input, note from 3/29 reviewed.   Transaminitis, likely shock liver.  Improving    Toxic encephalopathy due to receiving Ativan, Ativan discontinued and added to allergy list, gentle hydration and monitor.  Severe Diarrhea.  Ruled out C. difficile.  GI pathogen panel pending, PRN Imodium.  Severe hypokalemia.  Replaced will monitor.    DM2 - on ISS, Home Januvia, metformin, glipizide on hold  CBG (last 3)  Recent Labs    10/01/18 2024 10/02/18 0132 10/02/18 0743  GLUCAP 282* 224* 113*     Family Communication  :  None  Code Status :  Full  Disposition Plan  : SNF  Consults  :  PCCM, ID,   Procedures  :    3/21 R IJ CVC >> 3/22 R radial Aline >> 3/24 ETT >> 3/24 foley >> 3/29>> Rectal tube  CT abdomen and pelvis 3/20 1-75mm right UPJ stone with moderate right hydronephrosis and prominent right perinephrid edema 83mm non obstructing stone upper pole right kidney  Abdominal Ultrasound   Probable fatty infiltration of liver  Moderate hydronephrosis   Echo 08/2018 1. The left ventricle has decreased systolic function with an ejection fraction of 40-45%. The cavity size was normal. There is moderate to severe concentric left ventricular hypertrophy. 2. The right ventricle has normal systolic function. The cavity was normal. There is no increase in right ventricular wall thickness. 3. The tricuspid valve is normal in structure. 4. The pulmonic valve was normal in structure. 5. Right atrial pressure is estimated at 3 mmHg. 6. The aortic root, ascending aorta and aortic arch are normal in size and structure. 7. The aortic valve is tricuspid. 8. The mitral valve is normal in structure with mild MR  Repeat renal ultrasound 3/22>> resolved  hydronephrosis  CT abd / pelvis 3/25 >> no evidence of bowel wall thickening, distention, inflammatory changes.  Stomach normal.  Mild right hydronephrosis with a 1 to 2 mm nonobstructing right renal stone.  The previously seen UPJ calculus is no  longer present   DVT Prophylaxis  :   Heparin   Lab Results  Component Value Date   PLT 104 (L) 10/02/2018    Diet :  Diet Order            DIET DYS 2 Room service appropriate? Yes; Fluid consistency: Nectar Thick  Diet effective now               Inpatient Medications Scheduled Meds:  heparin injection (subcutaneous)  5,000 Units Subcutaneous Q8H   insulin aspart  0-20 Units Subcutaneous Q4H   insulin aspart  3 Units Subcutaneous Q4H   metoprolol tartrate  5 mg Intravenous Q8H   nitroGLYCERIN  0.5 inch Topical Q6H   pantoprazole sodium  40 mg Per Tube Daily   potassium chloride  20 mEq Oral Once   sertraline  50 mg Oral Daily   vancomycin  125 mg Per Tube QID   Continuous Infusions:  sodium chloride 10 mL/hr at 09/28/18 0400   sodium chloride Stopped (09/29/18 2025)   cefTRIAXone (ROCEPHIN)  IV Stopped (10/01/18 1121)   dextrose 5 % with kcl     metronidazole 500 mg (10/02/18 0637)   potassium chloride     PRN Meds:.Place/Maintain arterial line **AND** sodium chloride, albuterol, hydrALAZINE, loperamide, ondansetron, phenol, Resource ThickenUp Clear, sodium chloride flush  Antibiotics  :   Anti-infectives (From admission, onward)   Start     Dose/Rate Route Frequency Ordered Stop   10/01/18 1100  cefTRIAXone (ROCEPHIN) 1 g in sodium chloride 0.9 % 100 mL IVPB     1 g 200 mL/hr over 30 Minutes Intravenous Every 24 hours 10/01/18 1031 10/05/18 1059   10/01/18 0745  vancomycin (VANCOCIN) 50 mg/mL oral solution 125 mg     125 mg Per Tube 4 times daily 10/01/18 0720 10/10/18 2159   10/01/18 0730  metroNIDAZOLE (FLAGYL) IVPB 500 mg     500 mg 100 mL/hr over 60 Minutes Intravenous Every 8 hours 10/01/18 0719       09/26/18 2100  cefTRIAXone (ROCEPHIN) 2 g in sodium chloride 0.9 % 100 mL IVPB  Status:  Discontinued     2 g 200 mL/hr over 30 Minutes Intravenous Every 24 hours 09/26/18 1210 09/30/18 1511   09/26/18 1900  vancomycin (VANCOCIN) 50 mg/mL oral solution 500 mg  Status:  Discontinued     500 mg Per Tube Every 6 hours 09/26/18 1432 09/27/18 1613   09/25/18 1930  vancomycin (VANCOCIN) 50 mg/mL oral solution 125 mg  Status:  Discontinued     125 mg Per Tube 4 times daily 09/25/18 1927 09/26/18 1432   09/25/18 1800  ceFAZolin (ANCEF) IVPB 2g/100 mL premix  Status:  Discontinued     2 g 200 mL/hr over 30 Minutes Intravenous Every 12 hours 09/25/18 1037 09/25/18 1643   09/25/18 1800  vancomycin (VANCOCIN) 50 mg/mL oral solution 125 mg  Status:  Discontinued     125 mg Per Tube 4 times daily 09/25/18 1643 09/25/18 1927   09/25/18 1730  piperacillin-tazobactam (ZOSYN) IVPB 2.25 g  Status:  Discontinued     2.25 g 100 mL/hr over 30 Minutes Intravenous Every 6 hours 09/25/18 1650 09/26/18 1210   09/25/18 0600  piperacillin-tazobactam (ZOSYN) IVPB 2.25 g  Status:  Discontinued     2.25 g 100 mL/hr over 30 Minutes Intravenous Every 6 hours 09/24/18 2155 09/25/18 1037   09/24/18 2000  piperacillin-tazobactam (ZOSYN) IVPB 3.375 g  Status:  Discontinued     3.375 g  12.5 mL/hr over 240 Minutes Intravenous Every 8 hours 09/24/18 1454 09/24/18 2155   09/23/18 1200  piperacillin-tazobactam (ZOSYN) IVPB 2.25 g  Status:  Discontinued     2.25 g 100 mL/hr over 30 Minutes Intravenous Every 6 hours 09/23/18 0945 09/24/18 1454   09/23/18 1000  vancomycin (VANCOCIN) 50 mg/mL oral solution 125 mg  Status:  Discontinued     125 mg Oral 4 times daily 09/23/18 0152 09/25/18 1643   09/23/18 0600  piperacillin-tazobactam (ZOSYN) IVPB 3.375 g  Status:  Discontinued     3.375 g 100 mL/hr over 30 Minutes Intravenous Every 8 hours 09/23/18 0152 09/23/18 0155   09/23/18 0600  piperacillin-tazobactam (ZOSYN) IVPB 3.375 g   Status:  Discontinued     3.375 g 12.5 mL/hr over 240 Minutes Intravenous Every 8 hours 09/23/18 0156 09/23/18 0945        Objective:   Vitals:   09/30/18 2003 10/01/18 2029 10/01/18 2355 10/02/18 0758  BP:      Pulse:      Resp:      Temp: 97.8 F (36.6 C) 97.9 F (36.6 C) 97.9 F (36.6 C) 97.7 F (36.5 C)  TempSrc:  Axillary Oral Oral  SpO2:      Weight:      Height:        Wt Readings from Last 3 Encounters:  09/30/18 75.8 kg  08/20/18 70.3 kg  07/26/18 72.4 kg     Intake/Output Summary (Last 24 hours) at 10/02/2018 0841 Last data filed at 10/02/2018 0600 Gross per 24 hour  Intake 3814.43 ml  Output 3650 ml  Net 164.43 ml     Physical Exam  Awake Alert, Oriented X 2, No new F.N deficits, appears extremely frail and deconditioned [place].AT,PERRAL Supple Neck,No JVD, No cervical lymphadenopathy appriciated.  Symmetrical Chest wall movement, Good air movement bilaterally, CTAB RRR,No Gallops, Rubs or new Murmurs, No Parasternal Heave +ve B.Sounds, Abd Soft, No tenderness, No organomegaly appriciated, No rebound - guarding or rigidity. No Cyanosis, Clubbing or edema, No new Rash or bruise     Data Review:    CBC  Recent Labs  Lab 09/27/18 0446 09/28/18 0359 09/29/18 0422 10/01/18 0538 10/02/18 0353  WBC 29.3* 28.6* 28.7* 44.1* 32.0*  HGB 7.6* 7.9* 7.9* 8.3* 8.6*  HCT 25.0* 25.2* 25.4* 26.7* 27.6*  PLT 63* 67* 69* 92* 104*  MCV 94.3 92.6 93.0 95.4 94.2  MCH 28.7 29.0 28.9 29.6 29.4  MCHC 30.4 31.3 31.1 31.1 31.2  RDW 16.5* 16.9* 16.9* 17.4* 17.9*  LYMPHSABS  --  1.1  --   --   --   MONOABS  --  1.1*  --   --   --   EOSABS  --  0.0  --   --   --   BASOSABS  --  0.0  --   --   --     Chemistries   Recent Labs  Lab 09/26/18 0432  09/26/18 1244  09/27/18 0446 09/28/18 0359 09/29/18 0422 10/01/18 0538 10/01/18 0832 10/01/18 2300 10/02/18 0353  NA 140   < >  --    < > 140 143 146* 157*  --   --  149*  K 3.7   < >  --    < > 3.1* 3.8 3.7  2.2*  --  2.9* 2.7*  CL 104  --   --   --  105 106 114* 125*  --   --  108  CO2 18*  --   --   --  19* 23 21* 26  --   --  32  GLUCOSE 163*  --   --   --  158* 304* 347* 119*  --   --  207*  BUN 73*  --   --   --  81* 93* 109* 85*  --   --  69*  CREATININE 3.32*  --   --   --  3.22* 2.95* 2.78* 1.59*  --   --  1.42*  CALCIUM 7.1*  --   --   --  7.8* 8.6* 8.7* 7.6*  --   --  8.9  MG 2.6*  --   --   --  2.5*  --  2.4  --  2.0  --  1.7  AST 496*  --  459*  --  226*  --  217* 49*  --   --  47*  ALT 549*  --  498*  --  384*  --  346* 179*  --   --  169*  ALKPHOS 200*  --  194*  --  177*  --  358* 257*  --   --  311*  BILITOT 2.7*  --  2.5*  --  2.8*  --  2.8* 2.0*  --   --  2.0*   < > = values in this interval not displayed.   ------------------------------------------------------------------------------------------------------------------ No results for input(s): CHOL, HDL, LDLCALC, TRIG, CHOLHDL, LDLDIRECT in the last 72 hours.  Lab Results  Component Value Date   HGBA1C 6.4 (H) 05/08/2013   ------------------------------------------------------------------------------------------------------------------ No results for input(s): TSH, T4TOTAL, T3FREE, THYROIDAB in the last 72 hours.  Invalid input(s): FREET3 ------------------------------------------------------------------------------------------------------------------ No results for input(s): VITAMINB12, FOLATE, FERRITIN, TIBC, IRON, RETICCTPCT in the last 72 hours.  Coagulation profile Recent Labs  Lab 09/26/18 1244 10/01/18 0538  INR 1.7* 1.5*    No results for input(s): DDIMER in the last 72 hours.  Cardiac Enzymes No results for input(s): CKMB, TROPONINI, MYOGLOBIN in the last 168 hours.  Invalid input(s): CK ------------------------------------------------------------------------------------------------------------------    Component Value Date/Time   BNP 2,948.4 (H) 10/01/2018 5465    Micro Results Recent Results  (from the past 240 hour(s))  MRSA PCR Screening     Status: None   Collection Time: 09/23/18  1:24 AM  Result Value Ref Range Status   MRSA by PCR NEGATIVE NEGATIVE Final    Comment:        The GeneXpert MRSA Assay (FDA approved for NASAL specimens only), is one component of a comprehensive MRSA colonization surveillance program. It is not intended to diagnose MRSA infection nor to guide or monitor treatment for MRSA infections. Performed at [facility], [place] [address] [place], [place] [address]   Culture, blood (routine x 2)     Status: None   Collection Time: 09/23/18  6:03 AM  Result Value Ref Range Status   Specimen Description BLOOD RIGHT ANTECUBITAL  Final   Special Requests   Final    BOTTLES DRAWN AEROBIC ONLY Blood Culture adequate volume   Culture   Final    NO GROWTH 5 DAYS Performed at [facility], [place] [address], [place], [place] [address]    Report Status 09/28/2018 FINAL  Final  C difficile quick scan w PCR reflex     Status: None   Collection Time: 09/27/18 10:48 AM  Result Value Ref Range Status   C Diff antigen NEGATIVE NEGATIVE Final   C Diff toxin NEGATIVE NEGATIVE Final   C  Diff interpretation No C. difficile detected.  Final    Comment: Performed at [facility], [place] [address], [place], [place] [address]  C difficile quick scan w PCR reflex     Status: None   Collection Time: 10/01/18  9:27 AM  Result Value Ref Range Status   C Diff antigen NEGATIVE NEGATIVE Final   C Diff toxin NEGATIVE NEGATIVE Final   C Diff interpretation No C. difficile detected.  Final    Comment: Performed at [facility], [place] [address], [place], [place] [address]    Radiology Reports Ct Abdomen Pelvis Wo Contrast  Result Date: 09/26/2018 CLINICAL DATA:  Sepsis EXAM: CT ABDOMEN AND PELVIS WITHOUT CONTRAST TECHNIQUE: Multidetector CT imaging of the abdomen and pelvis was performed following the standard protocol without IV contrast. COMPARISON:   09/21/2018 FINDINGS: Lower chest: Small bilateral pleural effusions. Bibasilar airspace disease. Hepatobiliary: No focal liver abnormality is seen. Contrast fills the entirety of the gallbladder. No gallstones, gallbladder wall thickening, or biliary dilatation. Small amount of perihepatic ascites. Pancreas: Unremarkable. No pancreatic ductal dilatation or surrounding inflammatory changes. Spleen: Normal in size without focal abnormality. Adrenals/Urinary Tract: Adrenal glands are unremarkable. Mild right hydronephrosis. 1-2 mm nonobstructing right renal calculus. Previously seen right UPJ calculus is not seen on the current exam. No other ureteral calculi. No bladder calculi. Foley catheter within the bladder. Stomach/Bowel: Stomach is within normal limits. No evidence of bowel wall thickening, distention, or inflammatory changes. Rectal drainage tube is present. Vascular/Lymphatic: Abdominal aortic atherosclerosis. No lymphadenopathy. Reproductive: Uterus and bilateral adnexa are unremarkable. Other: Mild anasarca. Small amount pelvic free fluid. No drainable fluid collection. Musculoskeletal: No acute osseous abnormality. No aggressive osseous lesion. Posterior lumbar fusion and decompression from T10 through S1. Mild osteoarthritis of bilateral sacroiliac joints. IMPRESSION: 1. Mild right hydronephrosis. 1-2 mm nonobstructing right renal calculus. Previously seen right UPJ calculus is not seen on the current exam. No other ureteral calculi. 2. Mild anasarca 3. Small bilateral pleural effusions. Bibasilar airspace disease which may reflect atelectasis versus pneumonia. Electronically Signed   By: Kathreen Devoid   On: 09/26/2018 18:56   Dg Abd 1 View  Result Date: 09/25/2018 CLINICAL DATA:  63 year old female status post intubation for airway protection. Acute renal failure. EXAM: ABDOMEN - 1 VIEW COMPARISON:  Portable chest 1700 hours today. CT Abdomen and Pelvis 09/21/2018. FINDINGS: Portable AP semi upright  view at 1715 hours. Enteric tube courses to the left abdomen with side hole at the level of the gastric body. Stable confluent opacity at the left lung base. Paucity of bowel gas. Calcified aortic atherosclerosis. Extensive thoracic and lumbar posterior spinal fusion hardware again noted. IMPRESSION: Enteric tube placed, side hole at the level of the gastric body. Paucity of bowel gas. Electronically Signed   By: Genevie Ann M.D.   On: 09/25/2018 19:59   US Renal  Result Date: 09/23/2018 CLINICAL DATA:  Right sided hydronephrosis. EXAM: RENAL / URINARY TRACT ULTRASOUND COMPLETE COMPARISON:  CT 09/21/2018 and right upper quadrant ultrasound 09/21/2018 FINDINGS: Right Kidney: Renal measurements: 5.0 x 5.1 x 12.7 cm = volume: 167 mL . Echogenicity within normal limits. Prominence of the right renal pelvis without significant hydronephrosis. No focal mass. Left Kidney: Renal measurements: 5.1 x 5.7 x 11.6 cm = volume: 176 mL. Echogenicity within normal limits. Minimal fullness of the left renal pelvis. No focal mass. Bladder: Bladder is contracted. Small amount of free perihepatic fluid is visualized. IMPRESSION: Normal size kidneys. Prominence of the right renal  pelvis without significant hydronephrosis. Minimal fullness of the left renal pelvis. Small amount of perihepatic fluid. Electronically Signed   By: Marin Olp M.D.   On: 09/23/2018 10:29   Dg Chest Port 1 View  Result Date: 10/01/2018 CLINICAL DATA:  Patient admitted 09/23/2018 and septic shock. EXAM: PORTABLE CHEST 1 VIEW COMPARISON:  Single-view of the chest 09/29/2018 and 09/27/2018. FINDINGS: Endotracheal tube and NG tube have been removed. Right IJ approach central venous catheter remains in place. Small left effusion and airspace disease in the left mid and lower lung zones has improved. There is increased right basilar atelectasis. Heart size is upper normal. No pneumothorax. IMPRESSION: Increased right basilar atelectasis. Improved left pleural  effusion and basilar airspace disease. Electronically Signed   By: Inge Rise M.D.   On: 10/01/2018 07:36   Dg Chest Port 1 View  Result Date: 09/29/2018 CLINICAL DATA:  Acute respiratory failure with hypoxia EXAM: PORTABLE CHEST 1 VIEW COMPARISON:  09/27/2018 FINDINGS: Endotracheal tube terminates 4 cm above the carina. Small layering left pleural effusion. Right lung is essentially clear, with right lower lobe scarring/atelectasis. Left lower lobe opacity, likely atelectasis. No pneumothorax. Cardiomegaly.  Thoracic aortic atherosclerosis. Enteric tube courses into the mid gastric body. Lower thoracic/upper lumbar spine fixation hardware, incompletely visualized. Right IJ venous catheter terminates at the cavoatrial junction. IMPRESSION: Endotracheal tube terminates 4 cm above the carina. Additional support apparatus as above. Small layering left pleural effusion. Left lower lobe opacity, likely atelectasis. Electronically Signed   By: Julian Hy M.D.   On: 09/29/2018 07:29   Dg Chest Port 1 View  Result Date: 09/27/2018 CLINICAL DATA:  Respiratory failure. EXAM: PORTABLE CHEST 1 VIEW COMPARISON:  09/26/2018 FINDINGS: Endotracheal tube terminates 3 cm above the carina. Enteric tube courses into the stomach. Right jugular catheter terminates over the lower SVC. The cardiomediastinal silhouette is unchanged. Left basilar consolidation has mildly improved. There is a persistent small left pleural effusion. Mild atelectasis is noted in the right lung base. No pneumothorax is identified. IMPRESSION: Mildly improved left basilar aeration. Persistent small left pleural effusion. Electronically Signed   By: Logan Bores M.D.   On: 09/27/2018 08:18   Dg Chest Port 1 View  Result Date: 09/26/2018 CLINICAL DATA:  Hypoxia EXAM: PORTABLE CHEST 1 VIEW COMPARISON:  September 25, 2018 FINDINGS: Endotracheal tube tip is 1.9 cm above the carina. Nasogastric tube tip and side port below the diaphragm. Central  catheter tip is at cavoatrial junction. No pneumothorax. There is persistent airspace consolidation in the left lower lobe. There are small pleural effusions bilaterally. There is no consolidation on the right. Heart size and pulmonary vascularity within normal limits. No adenopathy. There is aortic atherosclerosis. There is postoperative change in the lower thoracic and visualized lumbar regions. There are surgical clips in the left breast region. IMPRESSION: Tube and catheter positions as described without evident pneumothorax. Consolidation left lower lobe, likely due to a combination of atelectasis and pneumonia or possibly aspiration. There are small pleural effusions bilaterally. Stable cardiac silhouette. Aortic Atherosclerosis (ICD10-I70.0). Appearance stable compared to 1 day prior. Electronically Signed   By: Lowella Grip III M.D.   On: 09/26/2018 07:46   Dg Chest Port 1 View  Result Date: 09/25/2018 CLINICAL DATA:  Respiratory failure requiring intubation. EXAM: PORTABLE CHEST 1 VIEW COMPARISON:  Radiograph of same day. FINDINGS: Stable cardiomediastinal silhouette. Atherosclerosis of thoracic aorta is noted. Endotracheal tube is seen projected over tracheal air shadow with distal tip 1 cm above the carina. Nasogastric  tube is seen entering stomach. Right internal jugular catheter is noted with distal tip in expected position of cavoatrial junction. No pneumothorax is noted. Mild left pleural effusion is noted with associated atelectasis or infiltrate. Minimal right basilar subsegmental atelectasis is noted. Bony thorax is unremarkable. IMPRESSION: Endotracheal and nasogastric tubes are in grossly good position. Mild left pleural effusion is noted with associated atelectasis or infiltrate. Mild right basilar subsegmental atelectasis. Right internal jugular catheter in grossly good position. Electronically Signed   By: Marijo Conception, M.D.   On: 09/25/2018 18:36   Dg Chest Port 1 View  Result  Date: 09/25/2018 CLINICAL DATA:  Altered mental status. EXAM: PORTABLE CHEST 1 VIEW COMPARISON:  09/23/2018 FINDINGS: Mild enlargement of the cardiopericardial silhouette, stable. No mediastinal or hilar masses. There is opacity in the left lung base obscuring hemidiaphragm and partly silhouetting the left heart border, associated with air bronchograms. There is bilateral interstitial thickening greater in the lower lungs, superimposed on chronically prominent bronchovascular markings. Hazy opacities noted at the right lung base consistent with a small pleural effusion. Probable left pleural effusion. No pneumothorax. Right internal jugular central venous line is stable from the prior exam. IMPRESSION: 1. Worsened lung aeration compared to the prior study. There is increased opacity at the left lung base consistent with atelectasis or pneumonia. There also small pleural effusions as well as lower lung zone interstitial thickening. A component of congestive heart failure with interstitial edema is suspected. 2. Mild stable cardiomegaly. Electronically Signed   By: Lajean Manes M.D.   On: 09/25/2018 16:11   Dg Chest Port 1 View  Result Date: 09/23/2018 CLINICAL DATA:  Shortness of breath and cough. EXAM: PORTABLE CHEST 1 VIEW COMPARISON:  09/22/2018 FINDINGS: Stable position of the right IJ approach central venous catheter. Cardiomediastinal silhouette is enlarged. Calcific atherosclerotic disease of the aorta. Mediastinal contours appear intact. Bilateral lower lobe peribronchial thickening. Osseous structures are without acute abnormality. Partially visualized spinal fusion. Soft tissues are grossly normal. IMPRESSION: Bilateral lower lobe linear peribronchial airspace consolidation which may represent bronchitis or early bronchopneumonia. Electronically Signed   By: Fidela Salisbury M.D.   On: 09/23/2018 02:34   Dg Abd Portable 1v  Result Date: 10/01/2018 CLINICAL DATA:  63 year old female with C  difficile colitis EXAM: PORTABLE ABDOMEN - 1 VIEW COMPARISON:  10/01/2018, 09/29/2018, CT abdomen 09/26/2018 FINDINGS: Gas within stomach, small bowel, colon. No abnormal distention. No significant formed stool burden. No unexpected radiopaque foreign body, soft tissue density, or calcification. Surgical changes of the thoracolumbar spine. Pelvic phleboliths. IMPRESSION: No acute plain film finding, with nonspecific bowel gas pattern. Electronically Signed   By: Corrie Mckusick D.O.   On: 10/01/2018 09:39    Time Spent in minutes  30   Lala Lund M.D on 10/02/2018 at 8:41 AM  To page go to [email or web address] - password [email or web address]

## 2018-10-02 NOTE — NC FL2 (Signed)
[place] LEVEL OF CARE SCREENING TOOL     IDENTIFICATION  Patient Name: [name] Birthdate: 12-Feb-1956 Sex: female Admission Date (Current Location): 09/23/2018  [facility] and [place] Number:  [employer] and Address:  [facility] [place]. [facility], [place] [address] [place], [place] [address]      Provider Number: [id number]  Attending Physician Name and Address:  Thurnell Lose, MD  Relative Name and Phone Number:  Lynnae Sandhoff, spouse, [phone]    Current Level of Care: Hospital Recommended Level of Care: [place] Prior Approval Number:    Date Approved/Denied:   PASRR Number: [id number]  Discharge Plan: SNF    Current Diagnoses: Patient Active Problem List   Diagnosis Date Noted  . Hydronephrosis of right kidney   . AMS (altered mental status)   . Septic shock ([place]) 09/23/2018  . DOE (dyspnea on exertion) 07/27/2018  . Anemia, normocytic normochromic 07/27/2018  . Cough variant asthma vs uacs  07/24/2017  . IDA (iron deficiency anemia) 04/19/2017  . Restrictive lung disease 01/18/2017  . Simple chronic bronchitis ([place]) 11/15/2016  . Asthma 11/15/2016  . Chronic non-seasonal allergic rhinitis 11/15/2016  . Hyperlipidemia 11/15/2016  . Nephrolithiasis 11/15/2016  . GERD (gastroesophageal reflux disease) 11/15/2016  . Diarrhea 11/15/2016  . Atrial fibrillation ([place]) 11/15/2016  . Left breast mass 07/19/2013  . History of breast cancer in female 07/19/2013  . Type II or unspecified type diabetes mellitus without mention of complication, not stated as uncontrolled 05/11/2013  . Respiratory failure requiring intubation ([place]) 05/08/2013  . HTN (hypertension) 05/08/2013  . Breast cancer ([place])     Orientation RESPIRATION BLADDER Height & Weight     Place, Time, Self  O2(Nasal cannula 3L) Continent Weight: 167 lb 1.7 oz (75.8 kg)(bed weight, nothing extra on bed) Height:  4\' 11"  (149.9 cm)  BEHAVIORAL  SYMPTOMS/MOOD NEUROLOGICAL BOWEL NUTRITION STATUS      Continent(Rectal tube) Diet(Please see DC Summary)  AMBULATORY STATUS COMMUNICATION OF NEEDS Skin   Extensive Assist Verbally Normal                       Personal Care Assistance Level of Assistance  Bathing, Feeding, Dressing Bathing Assistance: Maximum assistance Feeding assistance: Limited assistance Dressing Assistance: Limited assistance     Functional Limitations Info  Sight, Hearing, Speech Sight Info: Adequate Hearing Info: Adequate Speech Info: Adequate    SPECIAL CARE FACTORS FREQUENCY  PT (By licensed PT), OT (By licensed OT), Speech therapy     PT Frequency: 5x/week OT Frequency: 3x/week     Speech Therapy Frequency: 1x/week      Contractures Contractures Info: Not present    Additional Factors Info  Code Status, Allergies, Psychotropic, Insulin Sliding Scale Code Status Info: Full Allergies Info: Enteric precautions (pending GI test results) Psychotropic Info: Zoloft Insulin Sliding Scale Info: Every 4 hours       Current Medications (10/02/2018):  This is the current hospital active medication list Current Facility-Administered Medications  Medication Dose Route Frequency Provider Last Rate Last Dose  . 0.9 %  sodium chloride infusion  250 mL Intravenous Continuous Collene Gobble, MD 10 mL/hr at 09/28/18 0400    . 0.9 %  sodium chloride infusion   Intra-arterial PRN Collene Gobble, MD   Stopped at 09/29/18 2025  . albuterol (PROVENTIL HFA;VENTOLIN HFA) 108 (90 Base) MCG/ACT inhaler 2 puff  2 puff Inhalation Q4H PRN Collene Gobble, MD      .  cefTRIAXone (ROCEPHIN) 1 g in sodium chloride 0.9 % 100 mL IVPB  1 g Intravenous Q24H Thurnell Lose, MD   Stopped at 10/01/18 1121  . dextrose 5 % 1,000 mL with potassium chloride 40 mEq infusion   Intravenous Continuous Thurnell Lose, MD      . heparin injection 5,000 Units  5,000 Units Subcutaneous Q8H Thurnell Lose, MD   5,000 Units at  10/02/18 0503  . hydrALAZINE (APRESOLINE) injection 10 mg  10 mg Intravenous Q6H PRN Lala Lund K, MD      . insulin aspart (novoLOG) injection 0-20 Units  0-20 Units Subcutaneous Q4H Collene Gobble, MD   7 Units at 10/02/18 0459  . insulin aspart (novoLOG) injection 3 Units  3 Units Subcutaneous Q4H Collene Gobble, MD   3 Units at 10/02/18 0459  . [START ON 10/03/2018] loperamide (IMODIUM) capsule 4 mg  4 mg Oral Q8H PRN Thurnell Lose, MD      . loperamide (IMODIUM) capsule 4 mg  4 mg Oral Q6H Thurnell Lose, MD   4 mg at 10/02/18 0925  . metoprolol tartrate (LOPRESSOR) injection 5 mg  5 mg Intravenous Q8H Collene Gobble, MD   5 mg at 10/02/18 0503  . metroNIDAZOLE (FLAGYL) IVPB 500 mg  500 mg Intravenous Q8H Lala Lund K, MD 100 mL/hr at 10/02/18 0637 500 mg at 10/02/18 0637  . nitroGLYCERIN (NITROGLYN) 2 % ointment 0.5 inch  0.5 inch Topical Q6H Thurnell Lose, MD   0.5 inch at 10/02/18 0507  . ondansetron (ZOFRAN) injection 4 mg  4 mg Intravenous Q6H PRN Gardiner Barefoot, NP   4 mg at 10/02/18 0500  . pantoprazole sodium (PROTONIX) 40 mg/20 mL oral suspension 40 mg  40 mg Per Tube Daily Collene Gobble, MD   40 mg at 10/02/18 0925  . phenol (CHLORASEPTIC) mouth spray 1 spray  1 spray Mouth/Throat PRN Collene Gobble, MD      . potassium chloride 10 mEq in 100 mL IVPB  10 mEq Intravenous Q1 Hr x 5 Thurnell Lose, MD 100 mL/hr at 10/02/18 0931 10 mEq at 10/02/18 0931  . potassium chloride SA (K-DUR,KLOR-CON) CR tablet 20 mEq  20 mEq Oral Once Thurnell Lose, MD      . Resource ThickenUp Clear   Oral PRN [place], Silver Huguenin, MD      . sertraline (ZOLOFT) tablet 50 mg  50 mg Oral Daily Collene Gobble, MD   50 mg at 10/02/18 0762  . sodium chloride flush (NS) 0.9 % injection 10-40 mL  10-40 mL Intracatheter PRN Thurnell Lose, MD      . vancomycin (VANCOCIN) 50 mg/mL oral solution 125 mg  125 mg Per Tube QID Thurnell Lose, MD   125 mg at 10/01/18 2258      Discharge Medications: Please see discharge summary for a list of discharge medications.  Relevant Imaging Results:  Relevant Lab Results:   Additional Information SSn: [place], Kevin

## 2018-10-02 NOTE — Progress Notes (Signed)
  Speech Language Pathology Treatment: Dysphagia  Patient Details Name: [name] MRN: [id number] DOB: April 08, 1956 Today's Date: 10/02/2018 Time: 2992-4268 SLP Time Calculation (min) (ACUTE ONLY): 20 min  Assessment / Plan / Recommendation Clinical Impression  Patient seen to address dysphagia goals with lunch meal (Dys 2, nectar thick liquids). Patient sitting in recliner and with adequate positioning for safe PO intake. Patient had difficulty with self-feeding and had a slight tremor "I have the shakes", requiring SLP to assist with scooping bites of puree onto spoon, and then she was able to pick up and bring to mouth. Patient did exhibit mild frequency and intensity of delayed throat clearing and delayed cough, but voice remained clear and patient did not appear to be in any distress or exhibit any other overt s/s of aspiration or penetration. Patient was confused and did not consistently respond to clinician's questions at basic level, and would frequently just look at clinician with a blank or confused stare. Patient's text alerts on phone would ring several times and patient rarely attended to it and only once looked at phone when ringing (it was on table).  Patient consumed approximately 6 bites of puree (magic cup) and 2-3 small sips of nectar thick liquids but then declined any more, saying she wasn't very hungry. Plan to continue with current diet with planned assessment of ability to upgrade diet consistencies at bedside or via objective swallow study (MBS).     HPI HPI: Pt is a 63 year old woman admitted to [facility] from [facility] on 3/22 with septic shock, AKI, +cdiff, uropathy 2/2 kidney stones. Pt intubated 3/24-3/28. PMH: DM, CHF with EF of 45%, afib.       SLP Plan  Continue with current plan of care       Recommendations  Diet recommendations: Dysphagia 2 (fine chop);Nectar-thick liquid Liquids provided via: Cup;Straw Medication Administration: Whole meds with  puree Supervision: Full supervision/cueing for compensatory strategies;Staff to assist with self feeding Compensations: Minimize environmental distractions;Slow rate;Small sips/bites Postural Changes and/or Swallow Maneuvers: Seated upright 90 degrees                Oral Care Recommendations: Oral care BID Follow up Recommendations: Inpatient Rehab;Skilled Nursing facility(CIR vs SNF) SLP Visit Diagnosis: Dysphagia, unspecified (R13.10) Plan: Continue with current plan of care       Sonia Baller, MA, [place] [facility] Pager: [phone]

## 2018-10-02 NOTE — Consult Note (Signed)
++     Physical Medicine and Rehabilitation Consult   Reason for Consult: Debility Referring Physician: Dr. Candiss Norse    HPI: [name] is a 63 y.o. female with history of I5OY, systolic HF, A. fib-on Eliquis, chronic UTIs;  who was transferred to [facility] on 09/23/2018 from [facility] for to higher level of care management of septic shock with gram negative bacteremia due to right hydronephrosis and pyelonephritis.  History taken from chart review.  There was some concerns for coronavirus infection as patient recently in group gathering.  She developed acute hypoxic respiratory failure with somnolence due to LLL PNA and required intubation on 3/24.  She was found to have progressive leucocytosis with WBC up to 30.5 and worsening of renal failure.   Abdominal CT done revealing non-obstructive right renal calculus and right UPJ calculus not seen.  Dr. Alinda Money felt that patient had passed right ureteral stone and no significant hydronephrosis or peri-renal abscess seen---urology signed off.   She has defervesced, stress dose steroids being weaned off and she tolerated extubation by 09/21/2018. Metabolic encephalopathy due to Ativan resolving but she continues to have significant diarrhea with WBC up to 44.1 therefore vancomycin/IV flagyl added empirically on 3/30.  Hypokalemia being managed with runs of K+.  ID felt that symptoms not consistent with COVID 19. Therapy evaluations to be done yesterday revealing issues with dysphagia post intubation, delayed processing and weakness affecting mobility and ADLs. CIR recommended due to decline in functional status.     Review of Systems  Constitutional: Positive for malaise/fatigue. Negative for chills and fever.  HENT: Negative for hearing loss.   Eyes: Negative for blurred vision and double vision.  Respiratory: Positive for cough. Negative for shortness of breath and wheezing.   Cardiovascular: Negative for chest pain and palpitations.  Gastrointestinal:  Positive for diarrhea, heartburn and nausea.  Genitourinary:       Foley in place  Musculoskeletal: Positive for back pain and joint pain (bilateral hips).  Skin: Negative for itching and rash.  Neurological: Positive for weakness. Negative for dizziness and headaches.  Psychiatric/Behavioral: Positive for memory loss.  All other systems reviewed and are negative.     Past Medical History:  Diagnosis Date   Arthritis    "back, knees, arms, wrists" (05/15/2013)   Asthma    Breast cancer ([place])    CHF (congestive heart failure) ([place])    "mild" (05/15/2013)   Chronic bronchitis ([facility])    Chronic lower back pain    Dysrhythmia    atrial fib/dr [place] [facility]   Family history of anesthesia complication    "my mother also had PONV" (05/15/2013)   GERD (gastroesophageal reflux disease)    Heart murmur    High cholesterol    Kidney stones    Migraine    "haven't had one in the early 2000's" (05/15/2013)   Pneumonia    "used to be chronic; last time I had it was 2013" (05/15/2013)   PONV (postoperative nausea and vomiting)    Recurrent UTI (urinary tract infection)    "from the continuous kidney stones; take Macrodantin qd" (05/15/2013)   Sepsis ([place]) 05   kidney stone infection   Tension headache    Type II diabetes mellitus ([place])     Past Surgical History:  Procedure Laterality Date   BILATERAL OOPHORECTOMY Bilateral 2006   "cause I needed to 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" (05/15/2013)   BREAST BIOPSY Left 02/2001   BREAST LUMPECTOMY  Left 02/2001   BREAST LUMPECTOMY Left 09/23/2013   Procedure: LEFT LUMPECTOMY WITH SPECIMEN MAMMOGRAM;  Surgeon: Adin Hector, MD;  Location: [place];  Service: General;  Laterality: Left;   COLONOSCOPY     DIAGNOSTIC LAPAROSCOPY     cyst-near ovary   LITHOTRIPSY     "2-3 times prior to 2002" (05/15/2013)   SPINAL FUSION N/A 05/08/2013   Procedure: T9-S1  INSTRUMENTED FUSION T12 -S1 DECOMPRESSION;  Surgeon: Melina Schools, MD;  Location: [facility];  Service: Orthopedics;  Laterality: N/A;   TUBAL LIGATION Bilateral 1985    Family History  Problem Relation Age of Onset   COPD Mother    Heart disease Mother    Breast cancer Mother    Kidney cancer Father    Liver cancer Brother    Environmental Allergies Other    Lupus Other        niece   Environmental Allergies Son     Social History:  Married. Disabled due to back issues. Used to work for [employer]. Husband manages home and cooks.  She reports that she is a non-smoker but has been exposed to tobacco smoke. She has never used smokeless tobacco. She reports that she does not drink alcohol or use drugs.   Allergies  Allergen Reactions   Ativan [Lorazepam]    Other Nausea And Vomiting and Swelling    Shrimp if eat a lot   Cleocin [Clindamycin Hcl] Other (See Comments)    "irritated my esophagus"   Morphine And Related Nausea And Vomiting   Pneumococcal Vaccines Other (See Comments)    Really high fever, and flu symptoms. MD instructed not to give   Buprenorphine Hcl Nausea And Vomiting    Medications Prior to Admission  Medication Sig Dispense Refill   acetaminophen (TYLENOL) 500 MG tablet Take 500 mg every 6 (six) hours as needed by mouth.     albuterol (PROVENTIL HFA;VENTOLIN HFA) 108 (90 Base) MCG/ACT inhaler Inhale 2 puffs into the lungs as needed for wheezing or shortness of breath. 1 Inhaler 0   ALPRAZolam (XANAX) 0.25 MG tablet Take 0.25 mg by mouth 3 (three) times daily as needed for anxiety.      atorvastatin (LIPITOR) 10 MG tablet Take 10 mg by mouth daily at 6 PM.   1   carvedilol (COREG) 6.25 MG tablet Take 6.25 mg by mouth 2 (two) times daily with a meal.      ELIQUIS 5 MG TABS tablet TAKE 1 TABLET BY MOUTH TWICE A DAY (Patient taking differently: Take 5 mg by mouth 2 (two) times daily. ) 60 tablet 5   flecainide (TAMBOCOR) 50 MG tablet Take 50 mg by  mouth 2 (two) times daily.      fluticasone (FLONASE) 50 MCG/ACT nasal spray PLACE 1 SPRAY INTO BOTH NOSTRILS 2 (TWO) TIMES DAILY. 16 g 0   glipiZIDE (GLUCOTROL XL) 5 MG 24 hr tablet Take 10 mg by mouth daily with breakfast.      ipratropium (ATROVENT) 0.06 % nasal spray Place 2 sprays into both nostrils 3 (three) times daily.     loratadine (CLARITIN) 10 MG tablet Take 10 mg by mouth daily as needed for allergies.     metFORMIN (GLUCOPHAGE) 1000 MG tablet Take 1,000 mg by mouth 2 (two) times daily with a meal.     montelukast (SINGULAIR) 10 MG tablet TAKE 1 TABLET BY MOUTH EVERYDAY AT BEDTIME (Patient taking differently: Take 10 mg by mouth at bedtime. TAKE 1 TABLET BY  MOUTH EVERYDAY AT BEDTIME) 30 tablet 3   omeprazole (PRILOSEC) 20 MG capsule Take 20 mg by mouth daily.      sacubitril-valsartan (ENTRESTO) 24-26 MG Take 1 tablet by mouth 2 (two) times daily. 60 tablet 3   sertraline (ZOLOFT) 50 MG tablet Take 50 mg by mouth daily as needed (anxiety).   5   sitaGLIPtin (JANUVIA) 100 MG tablet Take 100 mg by mouth daily.      Home: Home Living Family/patient expects to be discharged to:: Private residence Living Arrangements: Spouse/significant other Available Help at Discharge: Family, Available 24 hours/day Type of Home: House Home Access: Stairs to enter [employer] of Steps: 2 Entrance Stairs-Rails: Can reach both Home Layout: One level Bathroom Shower/Tub: Multimedia programmer: [place]: [place] - single point, Environmental consultant - 4 wheels, Shower seat - built in  Functional History: Prior Function Level of Independence: Needs assistance Gait / Transfers Assistance Needed: uses cane mostly, sometimes Rollator  ADL's / Homemaking Assistance Needed: able to bathe and dress, husband does cooking and cleaning Functional Status:  Mobility: Bed Mobility Overal bed mobility: Needs Assistance Bed Mobility: Supine to Sit Supine to sit: +2 for physical  assistance, Total assist General bed mobility comments: Total A for coming EoB, LE management to floor and trunk to upright, pad scoot of hips to EoB Transfers Overall transfer level: Needs assistance Transfer via Lift Equipment: Stedy Transfers: Sit to/from Stand Sit to Stand: +2 physical assistance, Mod assist General transfer comment: modAx2 for L lateral lean, initiates power up assist for steadying Ambulation/Gait General Gait Details: unable to attempt    ADL: ADL General ADL Comments: currently requiring total assistance, attempted to self feed  Cognition: Cognition Overall Cognitive Status: Difficult to assess Orientation Level: Oriented to person, Oriented to place, Oriented to time, Disoriented to situation Cognition Arousal/Alertness: Awake/alert Behavior During Therapy: Flat affect Overall Cognitive Status: Difficult to assess General Comments: A&Ox3, provided good home living and prior level of function, slow processing and increased effort for responses   Blood pressure (!) 160/85, pulse 77, temperature 97.7 F (36.5 C), temperature source Oral, resp. rate (!) 21, height 4\' 11"  (1.499 m), weight 75.8 kg, SpO2 97 %. Physical Exam  Nursing note and vitals reviewed. Constitutional: She appears well-developed.  Obese  HENT:  Head: Normocephalic and atraumatic.  Eyes: EOM are normal. Right eye exhibits no discharge. Left eye exhibits no discharge.  Neck: Normal range of motion. Neck supple.  Cardiovascular: Normal rate and regular rhythm.  Respiratory: Effort normal and breath sounds normal.  + Tachypnea  GI: Soft. Bowel sounds are normal. She exhibits distension.  Genitourinary:    Genitourinary Comments: Foley and rectal tube in place.   Musculoskeletal:     Comments: RUE weakness due to RTC tear.   Neurological: She is alert.  Slow to process with delayed answers.  Oriented X2  Able to follow simple motor commands.  Motor: Bilateral upper extremities: 3/5  proximally, 4-/5 distally Bilateral lower extremities: 2+/5 proximally, 4-/5 distally  Skin: Skin is warm and dry.  With dressing C/D/I  Psychiatric: Her affect is blunt. Her speech is delayed. She is slowed.    Results for orders placed or performed during the hospital encounter of 09/23/18 (from the past 24 hour(s))  C difficile quick scan w PCR reflex     Status: None   Collection Time: 10/01/18  9:27 AM  Result Value Ref Range   C Diff antigen NEGATIVE NEGATIVE   C Diff toxin  NEGATIVE NEGATIVE   C Diff interpretation No C. difficile detected.   Glucose, capillary     Status: Abnormal   Collection Time: 10/01/18 12:03 PM  Result Value Ref Range   Glucose-Capillary 180 (H) 70 - 99 mg/dL  Glucose, capillary     Status: Abnormal   Collection Time: 10/01/18  4:53 PM  Result Value Ref Range   Glucose-Capillary 337 (H) 70 - 99 mg/dL  Glucose, capillary     Status: Abnormal   Collection Time: 10/01/18  8:24 PM  Result Value Ref Range   Glucose-Capillary 282 (H) 70 - 99 mg/dL  Potassium     Status: Abnormal   Collection Time: 10/01/18 11:00 PM  Result Value Ref Range   Potassium 2.9 (L) 3.5 - 5.1 mmol/L  Glucose, capillary     Status: Abnormal   Collection Time: 10/02/18  1:32 AM  Result Value Ref Range   Glucose-Capillary 224 (H) 70 - 99 mg/dL  CBC     Status: Abnormal   Collection Time: 10/02/18  3:53 AM  Result Value Ref Range   WBC 32.0 (H) 4.0 - 10.5 K/uL   RBC 2.93 (L) 3.87 - 5.11 MIL/uL   Hemoglobin 8.6 (L) 12.0 - 15.0 g/dL   HCT 27.6 (L) 36.0 - 46.0 %   MCV 94.2 80.0 - 100.0 fL   MCH 29.4 26.0 - 34.0 pg   MCHC 31.2 30.0 - 36.0 g/dL   RDW 17.9 (H) 11.5 - 15.5 %   Platelets 104 (L) 150 - 400 K/uL   nRBC 0.6 (H) 0.0 - 0.2 %  Comprehensive metabolic panel     Status: Abnormal   Collection Time: 10/02/18  3:53 AM  Result Value Ref Range   Sodium 149 (H) 135 - 145 mmol/L   Potassium 2.7 (LL) 3.5 - 5.1 mmol/L   Chloride 108 98 - 111 mmol/L   CO2 32 22 - 32 mmol/L    Glucose, Bld 207 (H) 70 - 99 mg/dL   BUN 69 (H) 8 - 23 mg/dL   Creatinine, Ser 1.42 (H) 0.44 - 1.00 mg/dL   Calcium 8.9 8.9 - 10.3 mg/dL   Total Protein 6.1 (L) 6.5 - 8.1 g/dL   Albumin 2.5 (L) 3.5 - 5.0 g/dL   AST 47 (H) 15 - 41 U/L   ALT 169 (H) 0 - 44 U/L   Alkaline Phosphatase 311 (H) 38 - 126 U/L   Total Bilirubin 2.0 (H) 0.3 - 1.2 mg/dL   GFR calc non Af Amer 39 (L) >60 mL/min   GFR calc Af Amer 45 (L) >60 mL/min   Anion gap 9 5 - 15  Magnesium     Status: None   Collection Time: 10/02/18  3:53 AM  Result Value Ref Range   Magnesium 1.7 1.7 - 2.4 mg/dL  Glucose, capillary     Status: Abnormal   Collection Time: 10/02/18  7:43 AM  Result Value Ref Range   Glucose-Capillary 113 (H) 70 - 99 mg/dL   Dg Chest Port 1 View  Result Date: 10/01/2018 CLINICAL DATA:  Patient admitted 09/23/2018 and septic shock. EXAM: PORTABLE CHEST 1 VIEW COMPARISON:  Single-view of the chest 09/29/2018 and 09/27/2018. FINDINGS: Endotracheal tube and NG tube have been removed. Right IJ approach central venous catheter remains in place. Small left effusion and airspace disease in the left mid and lower lung zones has improved. There is increased right basilar atelectasis. Heart size is upper normal. No pneumothorax. IMPRESSION: Increased right basilar atelectasis. Improved  left pleural effusion and basilar airspace disease. Electronically Signed   By: Inge Rise M.D.   On: 10/01/2018 07:36   Dg Abd Portable 1v  Result Date: 10/01/2018 CLINICAL DATA:  63 year old female with C difficile colitis EXAM: PORTABLE ABDOMEN - 1 VIEW COMPARISON:  10/01/2018, 09/29/2018, CT abdomen 09/26/2018 FINDINGS: Gas within stomach, small bowel, colon. No abnormal distention. No significant formed stool burden. No unexpected radiopaque foreign body, soft tissue density, or calcification. Surgical changes of the thoracolumbar spine. Pelvic phleboliths. IMPRESSION: No acute plain film finding, with nonspecific bowel gas  pattern. Electronically Signed   By: Corrie Mckusick D.O.   On: 10/01/2018 09:39    Assessment/Plan: Diagnosis: Debility Labs independently reviewed.  Records reviewed and summated above.  1. Does the need for close, 24 hr/day medical supervision in concert with the patient's rehab needs make it unreasonable for this patient to be served in a less intensive setting? Yes  2. Co-Morbidities requiring supervision/potential complications: T2 DM (Monitor in accordance with exercise and adjust meds as necessary), systolic CHF (Monitor in accordance with increased physical activity and avoid UE resistance excercises), A. Fib (continue meds, monitor heart rate with increased mobility), chronic UTIs, ID, suspected C. Dif (DC vancomycin/IV when appropriate), leukocytosis (repeat labs, cont to monitor for signs and symptoms of infection, further workup if indicated), tachypnea (monitor RR and O2 Sats with increased physical exertion), HTN (transition to oral medications when appropriate, monitor and provide prns in accordance with increased physical exertion and pain), dysphagia (advance diet as tolerated) 3. Due to bladder management, bowel management, safety, skin/wound care, disease management, medication administration, pain management and patient education, does the patient require 24 hr/day rehab nursing? Yes 4. Does the patient require coordinated care of a physician, rehab nurse, PT (1-2 hrs/day, 5 days/week), OT (1-2 hrs/day, 5 days/week) and SLP (1-2 hrs/day, 5 days/week) to address physical and functional deficits in the context of the above medical diagnosis(es)? Yes Addressing deficits in the following areas: balance, endurance, locomotion, strength, transferring, bowel/bladder control, bathing, dressing, feeding, grooming, toileting, cognition, speech, swallowing and psychosocial support 5. Can the patient actively participate in an intensive therapy program of at least 3 hrs of therapy per day at least  5 days per week? Potentially 6. The potential for patient to make measurable gains while on inpatient rehab is excellent 7. Anticipated functional outcomes upon discharge from inpatient rehab are min assist  with PT, min assist with OT, supervision with SLP. 8. Estimated rehab length of stay to reach the above functional goals is: 17-20 days. 9. Anticipated D/C setting: Home 10. Anticipated post D/C treatments: HH therapy and Home excercise program 11. Overall Rehab/Functional Prognosis: excellent and good  RECOMMENDATIONS: This patient's condition is appropriate for continued rehabilitative care in the following setting: CIR when patient able to tolerate 3 hours of therapy a day and medically stable if adequate caregiver support available upon discharge. Patient has agreed to participate in recommended program. Potentially Note that insurance prior authorization may be required for reimbursement for recommended care.  Comment: Rehab Admissions Coordinator to follow up.   I have personally performed a face to face diagnostic evaluation, including, but not limited to relevant history and physical exam findings, of this patient and developed relevant assessment and plan.  Additionally, I have reviewed and concur with the physician assistant's documentation above.   Delice Lesch, MD, ABPMR Bary Leriche, PA-C 10/02/2018

## 2018-10-02 NOTE — Progress Notes (Addendum)
Nutrition Follow-up   RD working remotely - Late Entry  DOCUMENTATION CODES:   Obesity unspecified  INTERVENTION:    Please assist with feeding patient at meals  Ensure Enlive po TID, each supplement provides 350 kcal and 20 grams of protein  NUTRITION DIAGNOSIS:   Inadequate oral intake now related to dysphagia as evidenced by meal completion < 50%, ongoing  NEW GOAL:   Patient will meet greater than or equal to 90% of their needs, progressing  MONITOR:   PO intake, Supplement acceptance, Labs, Skin, Weight trends, I & O's  ASSESSMENT:   63 yo female with PMH of A fib, asthma, GERD, HLD, CHF, DM-2, breast cancer, and recurrent UTI's who was admitted with septic shock, E coli UTI, suspected renal calculus, GNR bacteremia, and C diff colitis. Required intubation 3/24 afternoon.  Pt extubated and TF discontinued via OGT 3/28. Transferred out of [facility] to [facility] 3/29. S/o bedside swallow evaluation; SLP rec Dys 2-nectar thick liquids.  Spoke with Hoyle Sauer, RN; pt tolerating diet. PO intake poor however at 25% per flowsheets records. Would benefit from supplementation; will order.  Labs reviewed. Na 149 (H). K 2.7 (L). Medications include Imodium. CBG's [id number].  Speech Path note 3/31 reviewed. Pt having difficulty with feeding herself.  Concern for COVID 19; pt is very low risk. Isolation stopped 3/24.  Diet Order:  Diet Order            DIET DYS 2 Room service appropriate? Yes; Fluid consistency: Nectar Thick  Diet effective now             EDUCATION NEEDS:   No education needs have been identified at this time  Skin:  Skin Assessment: Reviewed RN Assessment  Last BM:  3/31   Intake/Output Summary (Last 24 hours) at 10/02/2018 1941 Last data filed at 10/02/2018 1800 Gross per 24 hour  Intake 2811.22 ml  Output 5350 ml  Net -2538.78 ml   Height:   Ht Readings from Last 1 Encounters:  09/23/18 4\' 11"  (1.499 m)  1 Weight:   Wt  Readings from Last 1 Encounters:  09/30/18 75.8 kg   Ideal Body Weight:  44.5 kg  BMI:  Body mass index is 33.75 kg/m.  Estimated Nutritional Needs:   Kcal:  1600-1800  Protein:  75-90 gm  Fluid:  1.6-1.8 L  Arthur Holms, RD, LDN Pager #: [phone] After-Hours Pager #: [phone]

## 2018-10-03 ENCOUNTER — Inpatient Hospital Stay (HOSPITAL_COMMUNITY): Payer: Medicare Other

## 2018-10-03 LAB — COMPREHENSIVE METABOLIC PANEL
ALT: 131 U/L — ABNORMAL HIGH (ref 0–44)
AST: 46 U/L — ABNORMAL HIGH (ref 15–41)
Albumin: 2.3 g/dL — ABNORMAL LOW (ref 3.5–5.0)
Alkaline Phosphatase: 283 U/L — ABNORMAL HIGH (ref 38–126)
Anion gap: 11 (ref 5–15)
BUN: 44 mg/dL — ABNORMAL HIGH (ref 8–23)
CALCIUM: 9 mg/dL (ref 8.9–10.3)
CO2: 29 mmol/L (ref 22–32)
Chloride: 104 mmol/L (ref 98–111)
Creatinine, Ser: 1.17 mg/dL — ABNORMAL HIGH (ref 0.44–1.00)
GFR calc Af Amer: 57 mL/min — ABNORMAL LOW (ref >60)
GFR calc non Af Amer: 50 mL/min — ABNORMAL LOW (ref >60)
Glucose, Bld: 187 mg/dL — ABNORMAL HIGH (ref 70–99)
Potassium: 4.3 mmol/L (ref 3.5–5.1)
Sodium: 144 mmol/L (ref 135–145)
Total Bilirubin: 1.6 mg/dL — ABNORMAL HIGH (ref 0.3–1.2)
Total Protein: 5.8 g/dL — ABNORMAL LOW (ref 6.5–8.1)

## 2018-10-03 LAB — CBC
HCT: 27.9 % — ABNORMAL LOW (ref 36.0–46.0)
Hemoglobin: 8.4 g/dL — ABNORMAL LOW (ref 12.0–15.0)
MCH: 28.7 pg (ref 26.0–34.0)
MCHC: 30.1 g/dL (ref 30.0–36.0)
MCV: 95.2 fL (ref 80.0–100.0)
Platelets: 103 10*3/uL — ABNORMAL LOW (ref 150–400)
RBC: 2.93 MIL/uL — ABNORMAL LOW (ref 3.87–5.11)
RDW: 19.3 % — ABNORMAL HIGH (ref 11.5–15.5)
WBC: 20.7 10*3/uL — ABNORMAL HIGH (ref 4.0–10.5)
nRBC: 0.6 % — ABNORMAL HIGH (ref 0.0–0.2)

## 2018-10-03 LAB — GLUCOSE, CAPILLARY
Glucose-Capillary: 124 mg/dL — ABNORMAL HIGH (ref 70–99)
Glucose-Capillary: 126 mg/dL — ABNORMAL HIGH (ref 70–99)
Glucose-Capillary: 133 mg/dL — ABNORMAL HIGH (ref 70–99)
Glucose-Capillary: 162 mg/dL — ABNORMAL HIGH (ref 70–99)
Glucose-Capillary: 223 mg/dL — ABNORMAL HIGH (ref 70–99)
Glucose-Capillary: 86 mg/dL (ref 70–99)

## 2018-10-03 MED ORDER — FLECAINIDE ACETATE 50 MG PO TABS
50.0000 mg | ORAL_TABLET | Freq: Two times a day (BID) | ORAL | Status: DC
  Administered 2018-10-03 – 2018-10-04 (×2): 50 mg via ORAL
  Filled 2018-10-03 (×2): qty 1

## 2018-10-03 MED ORDER — MAGNESIUM SULFATE IN D5W 1-5 GM/100ML-% IV SOLN
1.0000 g | Freq: Once | INTRAVENOUS | Status: AC
  Administered 2018-10-03: 1 g via INTRAVENOUS
  Filled 2018-10-03: qty 100

## 2018-10-03 MED ORDER — INSULIN GLARGINE 100 UNIT/ML ~~LOC~~ SOLN
5.0000 [IU] | Freq: Every day | SUBCUTANEOUS | Status: DC
  Administered 2018-10-03 – 2018-10-04 (×2): 5 [IU] via SUBCUTANEOUS
  Filled 2018-10-03 (×3): qty 0.05

## 2018-10-03 MED ORDER — APIXABAN 5 MG PO TABS
5.0000 mg | ORAL_TABLET | Freq: Two times a day (BID) | ORAL | Status: DC
  Administered 2018-10-03 – 2018-10-04 (×3): 5 mg via ORAL
  Filled 2018-10-03 (×3): qty 1

## 2018-10-03 MED ORDER — METOPROLOL TARTRATE 5 MG/5ML IV SOLN: 5.0000 mg | Freq: Four times a day (QID) | INTRAVENOUS | Status: DC | PRN

## 2018-10-03 MED ORDER — CARVEDILOL 3.125 MG PO TABS
3.1250 mg | ORAL_TABLET | Freq: Two times a day (BID) | ORAL | Status: DC
  Administered 2018-10-03 – 2018-10-04 (×2): 3.125 mg via ORAL
  Filled 2018-10-03 (×2): qty 1

## 2018-10-03 NOTE — Progress Notes (Signed)
Occupational Therapy Treatment Patient Details Name: [name] MRN: [id number] DOB: [birth year] Today's Date: 10/03/2018    History of present illness Pt is a 63 year old woman admitted to [facility] from [facility] on 3/22 with septic shock, AKI, +cdiff, uropathy 2/2 kidney stones. Pt intubated 3/24 to 3/28. PMH: DM, CHF with EF of 45%, afib.    OT comments  Pt performing bed mobility with minA overall transfers with stedy and minA for stability. Pt tolerating 4 mins with rest break and then 2 mins of standing with rest break and minguardA for support. Pt set-upA for grooming EOB with fair sitting dynamic balance. Pt tolerating session well and continues to improve. Pt requires assist with all ADL as pt very lethargic and slow to respond, but adequately follows commands. Pt will require continued OT skilled services for ADL, mobility and safety in CIR setting. OT to follow acutely.  HR 100-115 BPM post exertion. O2 >90% with 3L O2.    Follow Up Recommendations  CIR;Supervision/Assistance - 24 hour    Equipment Recommendations  3 in 1 bedside commode    Recommendations for Other Services Rehab consult    Precautions / Restrictions Precautions Precautions: Fall Restrictions Weight Bearing Restrictions: No       Mobility Bed Mobility Overal bed mobility: Needs Assistance Bed Mobility: Supine to Sit     Supine to sit: Min assist     General bed mobility comments: MinA and extended time, Mod VC for sequencing of transfer, once up able to maintain upright with distant S   Transfers Overall transfer level: Needs assistance   Transfers: Sit to/from Stand Sit to Stand: Min assist;Min guard         General transfer comment: MinA  from bed to standing in stedy, min guard when standing from stedy pads. Able to maintain standing for approximately 4 minutes on first attempt, 2 minutes on second attempt and HR to 115     Balance Overall balance assessment: Needs  assistance Sitting-balance support: Feet supported;Bilateral upper extremity supported Sitting balance-Leahy Scale: Good Sitting balance - Comments: able to maintain static midline sitting with light min guard to distant S today    Standing balance support: Bilateral upper extremity supported;During functional activity Standing balance-Leahy Scale: Poor Standing balance comment: pt resting elbows on stedy for stability and for energy conservation                           ADL either performed or assessed with clinical judgement   ADL Overall ADL's : Needs assistance/impaired Eating/Feeding: Set up;Sitting   Grooming: Set up;Sitting   Upper Body Bathing: Moderate assistance;Sitting;Standing   Lower Body Bathing: Maximal assistance;Sitting/lateral leans;Sit to/from stand   Upper Body Dressing : Moderate assistance;Sitting;Standing   Lower Body Dressing: Maximal assistance;Sitting/lateral leans;Sit to/from stand   Toilet Transfer: Grab bars;Minimal Patent examiner Details (indicate cue type and reason): assist for cueing Toileting- Clothing Manipulation and Hygiene: Maximal assistance;Sitting/lateral lean;Sit to/from stand       Functional mobility during ADLs: Minimal assistance;Cueing for safety;Cueing for sequencing General ADL Comments: Nearly totalA for LB ADL and set-up A for feeding and grooming; can  assist with UB ADL.     Vision Baseline Vision/History: Wears glasses Wears Glasses: Reading only Patient Visual Report: No change from baseline Vision Assessment?: No apparent visual deficits   Perception     Praxis      Cognition Arousal/Alertness: Awake/alert Behavior During Therapy: Flat affect Overall  Cognitive Status: Within Functional Limits for tasks assessed                                 General Comments: Increased time for processing for all questions and commads        Exercises     Shoulder Instructions        General Comments HR 100-115 BPM post exertion. O2 >90% on 3L O2    Pertinent Vitals/ Pain       Pain Assessment: Faces Pain Score: 0-No pain Faces Pain Scale: No hurt Pain Intervention(s): Monitored during session  Home Living Family/patient expects to be discharged to:: Private residence Living Arrangements: Spouse/significant other Available Help at Discharge: Family;Available 24 hours/day Type of Home: House Home Access: Stairs to enter [employer] of Steps: 2 Entrance Stairs-Rails: Can reach both Home Layout: One level     Bathroom Shower/Tub: Occupational psychologist: Standard     Home Equipment: Cane - single point;Walker - 4 wheels;Shower seat - built in          Prior Functioning/Environment Level of Independence: Needs assistance  Gait / Transfers Assistance Needed: uses cane mostly, sometimes Rollator  ADL's / Homemaking Assistance Needed: able to bathe and dress, husband does cooking and cleaning       Frequency  Min 3X/week        Progress Toward Goals  OT Goals(current goals can now be found in the care plan section)     Acute Rehab OT Goals Patient Stated Goal: none stated OT Goal Formulation: With patient Time For Goal Achievement: 10/15/18 Potential to Achieve Goals: Good  Plan      Co-evaluation    PT/OT/SLP Co-Evaluation/Treatment: Yes Reason for Co-Treatment: For patient/therapist safety;To address functional/ADL transfers PT goals addressed during session: Mobility/safety with mobility;Balance;Strengthening/ROM OT goals addressed during session: ADL's and self-care;Other (comment)(transfers)      AM-PAC OT "6 Clicks" Daily Activity     Outcome Measure   Help from another person eating meals?: A Lot Help from another person taking care of personal grooming?: A Lot Help from another person toileting, which includes using toliet, bedpan, or urinal?: Total Help from another person bathing (including  washing, rinsing, drying)?: Total Help from another person to put on and taking off regular upper body clothing?: Total Help from another person to put on and taking off regular lower body clothing?: A Lot 6 Click Score: 9    End of Session Equipment Utilized During Treatment: Gait belt;Oxygen  OT Visit Diagnosis: Unsteadiness on feet (R26.81);Other symptoms and signs involving cognitive function;Muscle weakness (generalized) (M62.81)   Activity Tolerance Treatment limited secondary to medical complications (Comment);Patient tolerated treatment well   Patient Left in chair;with call bell/phone within reach;with chair alarm set   Nurse Communication Mobility status;Need for lift equipment        Time: 4010-2725 OT Time Calculation (min): 35 min  Charges: OT General Charges $OT Visit: 1 Visit OT Treatments $Self Care/Home Management : 8-22 mins  Darryl Nestle) Marsa Aris OTR/L Acute Rehabilitation Services Pager: [phone] Office: [phone]    Fredda Hammed 10/03/2018, 10:44 AM

## 2018-10-03 NOTE — Progress Notes (Signed)
PROGRESS NOTE                                                                                                                                                                                                             Patient Demographics:    [name], is a 63 y.o. female, DOB - 1955-07-10, UYQ:[id number]  Admit date - 09/23/2018   Admitting Physician Jamesetta So, MD  Outpatient Primary MD for the patient is [name], [name]., MD  LOS - 10  CC Sepsis transfer from [facility]     Brief Narrative    [name] is a 63 year old female with history of G3OV, systolic HF EF 56%, Chr.AF on Eliquis who presents as a transfer from [place] for higher level of care for septic shock.  Admitted with fever, right hydronephrosis/pyelonephritis and septic shock with gram-negative sepsis.  There was some concern for coronavirus infection as she was recently at a wedding with 20 people (unknown if any positive exposures).  She had a flu swab done that was negative.  Covid tested at [facility], she was cleared for it by ID [name] at [facility].  Was treated for gram-negative sepsis and bacteremia, she was seen by urology ID and critical care.  She required endotracheal intubation and mechanical ventilation, she was transferred from ICU to my service on 10/01/2018.  At this time she appears severely intravascularly depleted with sodium of 152, potassium of 2, she is not mentating well and currently is n.p.o.   Subjective:   Patient in chair, appears comfortable, denies any headache, no fever, no chest pain or pressure, no shortness of breath , no abdominal pain. No focal weakness.    Assessment  & Plan :    Septic shock from GNR bacteremia, hemodynamically improved - blood cultures were positive at [facility] on 09/22/2018, received Rocephin, CT scans and ultrasounds done multiple times final conclusion was patient does not have an active ureteric obstruction or significant hydronephrosis, she  was seen by urology on 3/22 and urology decided no procedures were needed.  She is hemodynamically better and afebrile although she still has significant white count, steroids have been tapered off sepsis pathophysiology clinically has resolved, finish Rocephin course in the next few days.  Acute hypoxic respiratory failure, R/o LLL atelectasis vs infiltrate/ aspiration - requiring endotracheal intubation and mechanical ventilation under pulmonary critical care along with IV antibiotics.  Has finished antibiotics per critical care on 09/30/2018.  Speech to evaluate, currently n.p.o.  Gentle hydration.  Covid 19 was ruled out by ID physician Dr. [name] on 09/30/2018.   Diarrhea with leukocytosis and antibiotic exposure.  Both C. difficile and GI pathogen panel were negative, supportive care with Imodium diarrhea improved.  Acute kidney Injury, improving, Obstructive uropathy- resolved - AKI is likely multifactorial partly due to obstructive uropathy from kidney stones and partly prerenal due to shock.  With hydration has resolved, seen by urology no intervention needed per urology, repeat imaging for hydronephrosis much improved likely passed stone.  Severe thrombocytopenia, due to sepsis.  Could have had mild DIC.  INR was borderline.  Now with sepsis improved CBC and thrombocytopenia improving continue to monitor.   Chronic atrial fibrillation with [place] vas 2 score of at least 3.  Baseline she was on Coreg, flecainide and Eliquis.  Overall oral intake and blood pressure improved low-dose Coreg to be resumed, get repeat EKG today, pharmacy requested to start Eliquis and flecainide.   Chronic systolic Heart failure EF 45%.  Coreg resumed, diuretics on hold currently appears euvolemic after IV fluids.   Concern for COVID 19 - Patient is very low risk. Her low grade fever was due to E. coli bacteremia. Test already obtained at OSH, COVID-19 Isolation was stopped on 3/24.  Appreciate [name]  input, note from 3/29 reviewed.   Transaminitis, likely shock liver.  Improving    Toxic encephalopathy due to receiving Ativan, Ativan discontinued and added to allergy list, encephalopathy resolved now back to baseline.  Severe Diarrhea.  Ruled out C. difficile.  GI pathogen panel was also negative, PRN Imodium.  Severe hypokalemia.  relaced in stable.Marland Kitchen    DM2 - on ISS, will add low-dose Lantus as oral intake is improving and monitor, at [place], metformin, glipizide on hold .  CBG (last 3)  Recent Labs    10/03/18 0031 10/03/18 0426 10/03/18 0746  GLUCAP 223* 162* 124*     Family Communication  :  None  Code Status :  Full  Disposition Plan  : Home health PT versus CIR depending on improvement in the next 24 hours  Consults  :  PCCM, ID,   Procedures  :    3/21 R IJ CVC >> 3/22 R radial Aline >> 3/24 ETT >> 3/24 foley >> 3/29>> Rectal tube  CT abdomen and pelvis 3/20 1-34mm right UPJ stone with moderate right hydronephrosis and prominent right perinephrid edema 19mm non obstructing stone upper pole right kidney  Abdominal Ultrasound   Probable fatty infiltration of liver  Moderate hydronephrosis   Echo 08/2018 1. The left ventricle has decreased systolic function with an ejection fraction of 40-45%. The cavity size was normal. There is moderate to severe concentric left ventricular hypertrophy. 2. The right ventricle has normal systolic function. The cavity was normal. There is no increase in right ventricular wall thickness. 3. The tricuspid valve is normal in structure. 4. The pulmonic valve was normal in structure. 5. Right atrial pressure is estimated at 3 mmHg. 6. The aortic root, ascending aorta and aortic arch are normal in size and structure. 7. The aortic valve is tricuspid. 8. The mitral valve is normal in structure with mild MR  Repeat renal ultrasound 3/22>> resolved hydronephrosis  CT abd / pelvis 3/25 >> no evidence of bowel  wall thickening, distention, inflammatory changes.  Stomach normal.  Mild right hydronephrosis with a 1 to 2 mm nonobstructing right renal stone.  The previously seen UPJ calculus is no longer present   DVT Prophylaxis  :  Heparin   Lab Results  Component Value Date   PLT 103 (L) 10/03/2018    Diet :  Diet Order            DIET DYS 2 Room service appropriate? Yes; Fluid consistency: Nectar Thick  Diet effective now               Inpatient Medications Scheduled Meds:  feeding supplement (ENSURE ENLIVE)  237 mL Oral TID BM   heparin injection (subcutaneous)  5,000 Units Subcutaneous Q8H   insulin aspart  0-20 Units Subcutaneous Q4H   insulin aspart  3 Units Subcutaneous Q4H   metoprolol tartrate  5 mg Intravenous Q8H   nitroGLYCERIN  0.5 inch Topical Q6H   pantoprazole sodium  40 mg Per Tube Daily   sertraline  50 mg Oral Daily   Continuous Infusions:  sodium chloride 10 mL/hr at 09/28/18 0400   sodium chloride Stopped (09/29/18 2025)   cefTRIAXone (ROCEPHIN)  IV 1 g (10/02/18 1116)   dextrose 5 % with kcl 125 mL/hr at 10/02/18 2341   PRN Meds:.Place/Maintain arterial line **AND** sodium chloride, albuterol, hydrALAZINE, loperamide, ondansetron, phenol, Resource ThickenUp Clear, sodium chloride flush  Antibiotics  :   Anti-infectives (From admission, onward)   Start     Dose/Rate Route Frequency Ordered Stop   10/01/18 1100  cefTRIAXone (ROCEPHIN) 1 g in sodium chloride 0.9 % 100 mL IVPB     1 g 200 mL/hr over 30 Minutes Intravenous Every 24 hours 10/01/18 1031 10/05/18 1059   10/01/18 0745  vancomycin (VANCOCIN) 50 mg/mL oral solution 125 mg  Status:  Discontinued     125 mg Per Tube 4 times daily 10/01/18 0720 10/02/18 1452   10/01/18 0730  metroNIDAZOLE (FLAGYL) IVPB 500 mg  Status:  Discontinued     500 mg 100 mL/hr over 60 Minutes Intravenous Every 8 hours 10/01/18 0719 10/02/18 1452   09/26/18 2100  cefTRIAXone (ROCEPHIN) 2 g in sodium chloride 0.9 %  100 mL IVPB  Status:  Discontinued     2 g 200 mL/hr over 30 Minutes Intravenous Every 24 hours 09/26/18 1210 09/30/18 1511   09/26/18 1900  vancomycin (VANCOCIN) 50 mg/mL oral solution 500 mg  Status:  Discontinued     500 mg Per Tube Every 6 hours 09/26/18 1432 09/27/18 1613   09/25/18 1930  vancomycin (VANCOCIN) 50 mg/mL oral solution 125 mg  Status:  Discontinued     125 mg Per Tube 4 times daily 09/25/18 1927 09/26/18 1432   09/25/18 1800  ceFAZolin (ANCEF) IVPB 2g/100 mL premix  Status:  Discontinued     2 g 200 mL/hr over 30 Minutes Intravenous Every 12 hours 09/25/18 1037 09/25/18 1643   09/25/18 1800  vancomycin (VANCOCIN) 50 mg/mL oral solution 125 mg  Status:  Discontinued     125 mg Per Tube 4 times daily 09/25/18 1643 09/25/18 1927   09/25/18 1730  piperacillin-tazobactam (ZOSYN) IVPB 2.25 g  Status:  Discontinued     2.25 g 100 mL/hr over 30 Minutes Intravenous Every 6 hours 09/25/18 1650 09/26/18 1210   09/25/18 0600  piperacillin-tazobactam (ZOSYN) IVPB 2.25 g  Status:  Discontinued     2.25 g 100 mL/hr over 30 Minutes Intravenous Every 6 hours 09/24/18 2155 09/25/18 1037   09/24/18 2000  piperacillin-tazobactam (ZOSYN) IVPB 3.375 g  Status:  Discontinued     3.375 g 12.5 mL/hr over 240 Minutes Intravenous Every 8 hours 09/24/18 1454 09/24/18 2155   09/23/18 1200  piperacillin-tazobactam (ZOSYN) IVPB 2.25 g  Status:  Discontinued     2.25 g 100 mL/hr over 30 Minutes Intravenous Every 6 hours 09/23/18 0945 09/24/18 1454   09/23/18 1000  vancomycin (VANCOCIN) 50 mg/mL oral solution 125 mg  Status:  Discontinued     125 mg Oral 4 times daily 09/23/18 0152 09/25/18 1643   09/23/18 0600  piperacillin-tazobactam (ZOSYN) IVPB 3.375 g  Status:  Discontinued     3.375 g 100 mL/hr over 30 Minutes Intravenous Every 8 hours 09/23/18 0152 09/23/18 0155   09/23/18 0600  piperacillin-tazobactam (ZOSYN) IVPB 3.375 g  Status:  Discontinued     3.375 g 12.5 mL/hr over 240 Minutes  Intravenous Every 8 hours 09/23/18 0156 09/23/18 0945        Objective:   Vitals:   10/03/18 0427 10/03/18 0442 10/03/18 0504 10/03/18 0825  BP:  (!) 151/83    Pulse:  80    Resp:  (!) 26    Temp: 97.9 F (36.6 C)   98.2 F (36.8 C)  TempSrc: Oral   Oral  SpO2:  95%    Weight:   76 kg   Height:        Wt Readings from Last 3 Encounters:  10/03/18 76 kg  08/20/18 70.3 kg  07/26/18 72.4 kg     Intake/Output Summary (Last 24 hours) at 10/03/2018 1018 Last data filed at 10/03/2018 0800 Gross per 24 hour  Intake 2845.8 ml  Output 2100 ml  Net 745.8 ml     Physical Exam  Awake Alert, Oriented X 3, No new F.N deficits, Normal affect [place].AT,PERRAL Supple Neck,No JVD, No cervical lymphadenopathy appriciated.  Symmetrical Chest wall movement, Good air movement bilaterally, CTAB RRR,No Gallops, Rubs or new Murmurs, No Parasternal Heave +ve B.Sounds, Abd Soft, No tenderness, No organomegaly appriciated, No rebound - guarding or rigidity. No Cyanosis, Clubbing or edema, No new Rash or bruise    Data Review:    CBC  Recent Labs  Lab 09/28/18 0359 09/29/18 0422 10/01/18 0538 10/02/18 0353 10/03/18 0405  WBC 28.6* 28.7* 44.1* 32.0* 20.7*  HGB 7.9* 7.9* 8.3* 8.6* 8.4*  HCT 25.2* 25.4* 26.7* 27.6* 27.9*  PLT 67* 69* 92* 104* 103*  MCV 92.6 93.0 95.4 94.2 95.2  MCH 29.0 28.9 29.6 29.4 28.7  MCHC 31.3 31.1 31.1 31.2 30.1  RDW 16.9* 16.9* 17.4* 17.9* 19.3*  LYMPHSABS 1.1  --   --   --   --   MONOABS 1.1*  --   --   --   --   EOSABS 0.0  --   --   --   --   BASOSABS 0.0  --   --   --   --     Chemistries   Recent Labs  Lab 09/27/18 0446 09/28/18 0359 09/29/18 0422 10/01/18 0538 10/01/18 0832 10/01/18 2300 10/02/18 0353 10/02/18 1200 10/03/18 0405  NA 140 143 146* 157*  --   --  149*  --  144  K 3.1* 3.8 3.7 2.2*  --  2.9* 2.7* 3.9 4.3  CL 105 106 114* 125*  --   --  108  --  104  CO2 19* 23 21* 26  --   --  32  --  29  GLUCOSE 158* 304* 347* 119*  --    --  207*  --  187*  BUN 81* 93* 109* 85*  --   --  69*  --  44*  CREATININE 3.22* 2.95*  2.78* 1.59*  --   --  1.42*  --  1.17*  CALCIUM 7.8* 8.6* 8.7* 7.6*  --   --  8.9  --  9.0  MG 2.5*  --  2.4  --  2.0  --  1.7  --   --   AST 226*  --  217* 49*  --   --  47*  --  46*  ALT 384*  --  346* 179*  --   --  169*  --  131*  ALKPHOS 177*  --  358* 257*  --   --  311*  --  283*  BILITOT 2.8*  --  2.8* 2.0*  --   --  2.0*  --  1.6*   ------------------------------------------------------------------------------------------------------------------ No results for input(s): CHOL, HDL, LDLCALC, TRIG, CHOLHDL, LDLDIRECT in the last 72 hours.  Lab Results  Component Value Date   HGBA1C 6.4 (H) 05/08/2013   ------------------------------------------------------------------------------------------------------------------ No results for input(s): TSH, T4TOTAL, T3FREE, THYROIDAB in the last 72 hours.  Invalid input(s): FREET3 ------------------------------------------------------------------------------------------------------------------ No results for input(s): VITAMINB12, FOLATE, FERRITIN, TIBC, IRON, RETICCTPCT in the last 72 hours.  Coagulation profile Recent Labs  Lab 09/26/18 1244 10/01/18 0538  INR 1.7* 1.5*    No results for input(s): DDIMER in the last 72 hours.  Cardiac Enzymes No results for input(s): CKMB, TROPONINI, MYOGLOBIN in the last 168 hours.  Invalid input(s): CK ------------------------------------------------------------------------------------------------------------------    Component Value Date/Time   BNP 2,948.4 (H) 10/01/2018 1610    Micro Results Recent Results (from the past 240 hour(s))  C difficile quick scan w PCR reflex     Status: None   Collection Time: 09/27/18 10:48 AM  Result Value Ref Range Status   C Diff antigen NEGATIVE NEGATIVE Final   C Diff toxin NEGATIVE NEGATIVE Final   C Diff interpretation No C. difficile detected.  Final     Comment: Performed at [facility], [place] [address] [place], [place] [address]  C difficile quick scan w PCR reflex     Status: None   Collection Time: 10/01/18  9:27 AM  Result Value Ref Range Status   C Diff antigen NEGATIVE NEGATIVE Final   C Diff toxin NEGATIVE NEGATIVE Final   C Diff interpretation No C. difficile detected.  Final    Comment: Performed at [facility], [place] [address], [place], [place] [address]  Gastrointestinal Panel by PCR , Stool     Status: None   Collection Time: 10/01/18  7:36 PM  Result Value Ref Range Status   Campylobacter species NOT DETECTED NOT DETECTED Final   Plesimonas shigelloides NOT DETECTED NOT DETECTED Final   Salmonella species NOT DETECTED NOT DETECTED Final   Yersinia enterocolitica NOT DETECTED NOT DETECTED Final   Vibrio species NOT DETECTED NOT DETECTED Final   Vibrio cholerae NOT DETECTED NOT DETECTED Final   Enteroaggregative E coli (EAEC) NOT DETECTED NOT DETECTED Final   Enteropathogenic E coli (EPEC) NOT DETECTED NOT DETECTED Final   Enterotoxigenic E coli (ETEC) NOT DETECTED NOT DETECTED Final   Shiga like toxin producing E coli (STEC) NOT DETECTED NOT DETECTED Final   Shigella/Enteroinvasive E coli (EIEC) NOT DETECTED NOT DETECTED Final   Cryptosporidium NOT DETECTED NOT DETECTED Final   Cyclospora cayetanensis NOT DETECTED NOT DETECTED Final   Entamoeba histolytica NOT DETECTED NOT DETECTED Final   Giardia lamblia NOT DETECTED NOT DETECTED Final   Adenovirus F40/41 NOT DETECTED NOT DETECTED Final   Astrovirus NOT DETECTED NOT  DETECTED Final   Norovirus GI/GII NOT DETECTED NOT DETECTED Final   Rotavirus A NOT DETECTED NOT DETECTED Final   Sapovirus (I, II, IV, and V) NOT DETECTED NOT DETECTED Final    Comment: Performed at [facility] [address], [place], [place] [address]    Radiology Reports Ct Abdomen Pelvis Wo Contrast  Result Date: 09/26/2018 CLINICAL DATA:  Sepsis EXAM: CT ABDOMEN AND PELVIS  WITHOUT CONTRAST TECHNIQUE: Multidetector CT imaging of the abdomen and pelvis was performed following the standard protocol without IV contrast. COMPARISON:  09/21/2018 FINDINGS: Lower chest: Small bilateral pleural effusions. Bibasilar airspace disease. Hepatobiliary: No focal liver abnormality is seen. Contrast fills the entirety of the gallbladder. No gallstones, gallbladder wall thickening, or biliary dilatation. Small amount of perihepatic ascites. Pancreas: Unremarkable. No pancreatic ductal dilatation or surrounding inflammatory changes. Spleen: Normal in size without focal abnormality. Adrenals/Urinary Tract: Adrenal glands are unremarkable. Mild right hydronephrosis. 1-2 mm nonobstructing right renal calculus. Previously seen right UPJ calculus is not seen on the current exam. No other ureteral calculi. No bladder calculi. Foley catheter within the bladder. Stomach/Bowel: Stomach is within normal limits. No evidence of bowel wall thickening, distention, or inflammatory changes. Rectal drainage tube is present. Vascular/Lymphatic: Abdominal aortic atherosclerosis. No lymphadenopathy. Reproductive: Uterus and bilateral adnexa are unremarkable. Other: Mild anasarca. Small amount pelvic free fluid. No drainable fluid collection. Musculoskeletal: No acute osseous abnormality. No aggressive osseous lesion. Posterior lumbar fusion and decompression from T10 through S1. Mild osteoarthritis of bilateral sacroiliac joints. IMPRESSION: 1. Mild right hydronephrosis. 1-2 mm nonobstructing right renal calculus. Previously seen right UPJ calculus is not seen on the current exam. No other ureteral calculi. 2. Mild anasarca 3. Small bilateral pleural effusions. Bibasilar airspace disease which may reflect atelectasis versus pneumonia. Electronically Signed   By: Kathreen Devoid   On: 09/26/2018 18:56   Dg Abd 1 View  Result Date: 09/25/2018 CLINICAL DATA:  63 year old female status post intubation for airway protection.  Acute renal failure. EXAM: ABDOMEN - 1 VIEW COMPARISON:  Portable chest 1700 hours today. CT Abdomen and Pelvis 09/21/2018. FINDINGS: Portable AP semi upright view at 1715 hours. Enteric tube courses to the left abdomen with side hole at the level of the gastric body. Stable confluent opacity at the left lung base. Paucity of bowel gas. Calcified aortic atherosclerosis. Extensive thoracic and lumbar posterior spinal fusion hardware again noted. IMPRESSION: Enteric tube placed, side hole at the level of the gastric body. Paucity of bowel gas. Electronically Signed   By: Genevie Ann M.D.   On: 09/25/2018 19:59   US Renal  Result Date: 09/23/2018 CLINICAL DATA:  Right sided hydronephrosis. EXAM: RENAL / URINARY TRACT ULTRASOUND COMPLETE COMPARISON:  CT 09/21/2018 and right upper quadrant ultrasound 09/21/2018 FINDINGS: Right Kidney: Renal measurements: 5.0 x 5.1 x 12.7 cm = volume: 167 mL . Echogenicity within normal limits. Prominence of the right renal pelvis without significant hydronephrosis. No focal mass. Left Kidney: Renal measurements: 5.1 x 5.7 x 11.6 cm = volume: 176 mL. Echogenicity within normal limits. Minimal fullness of the left renal pelvis. No focal mass. Bladder: Bladder is contracted. Small amount of free perihepatic fluid is visualized. IMPRESSION: Normal size kidneys. Prominence of the right renal pelvis without significant hydronephrosis. Minimal fullness of the left renal pelvis. Small amount of perihepatic fluid. Electronically Signed   By: Marin Olp M.D.   On: 09/23/2018 10:29   Dg Chest Port 1 View  Result Date: 10/01/2018 CLINICAL DATA:  Patient admitted 09/23/2018 and  septic shock. EXAM: PORTABLE CHEST 1 VIEW COMPARISON:  Single-view of the chest 09/29/2018 and 09/27/2018. FINDINGS: Endotracheal tube and NG tube have been removed. Right IJ approach central venous catheter remains in place. Small left effusion and airspace disease in the left mid and lower lung zones has improved.  There is increased right basilar atelectasis. Heart size is upper normal. No pneumothorax. IMPRESSION: Increased right basilar atelectasis. Improved left pleural effusion and basilar airspace disease. Electronically Signed   By: Inge Rise M.D.   On: 10/01/2018 07:36   Dg Chest Port 1 View  Result Date: 09/29/2018 CLINICAL DATA:  Acute respiratory failure with hypoxia EXAM: PORTABLE CHEST 1 VIEW COMPARISON:  09/27/2018 FINDINGS: Endotracheal tube terminates 4 cm above the carina. Small layering left pleural effusion. Right lung is essentially clear, with right lower lobe scarring/atelectasis. Left lower lobe opacity, likely atelectasis. No pneumothorax. Cardiomegaly.  Thoracic aortic atherosclerosis. Enteric tube courses into the mid gastric body. Lower thoracic/upper lumbar spine fixation hardware, incompletely visualized. Right IJ venous catheter terminates at the cavoatrial junction. IMPRESSION: Endotracheal tube terminates 4 cm above the carina. Additional support apparatus as above. Small layering left pleural effusion. Left lower lobe opacity, likely atelectasis. Electronically Signed   By: Julian Hy M.D.   On: 09/29/2018 07:29   Dg Chest Port 1 View  Result Date: 09/27/2018 CLINICAL DATA:  Respiratory failure. EXAM: PORTABLE CHEST 1 VIEW COMPARISON:  09/26/2018 FINDINGS: Endotracheal tube terminates 3 cm above the carina. Enteric tube courses into the stomach. Right jugular catheter terminates over the lower SVC. The cardiomediastinal silhouette is unchanged. Left basilar consolidation has mildly improved. There is a persistent small left pleural effusion. Mild atelectasis is noted in the right lung base. No pneumothorax is identified. IMPRESSION: Mildly improved left basilar aeration. Persistent small left pleural effusion. Electronically Signed   By: Logan Bores M.D.   On: 09/27/2018 08:18   Dg Chest Port 1 View  Result Date: 09/26/2018 CLINICAL DATA:  Hypoxia EXAM: PORTABLE CHEST  1 VIEW COMPARISON:  September 25, 2018 FINDINGS: Endotracheal tube tip is 1.9 cm above the carina. Nasogastric tube tip and side port below the diaphragm. Central catheter tip is at cavoatrial junction. No pneumothorax. There is persistent airspace consolidation in the left lower lobe. There are small pleural effusions bilaterally. There is no consolidation on the right. Heart size and pulmonary vascularity within normal limits. No adenopathy. There is aortic atherosclerosis. There is postoperative change in the lower thoracic and visualized lumbar regions. There are surgical clips in the left breast region. IMPRESSION: Tube and catheter positions as described without evident pneumothorax. Consolidation left lower lobe, likely due to a combination of atelectasis and pneumonia or possibly aspiration. There are small pleural effusions bilaterally. Stable cardiac silhouette. Aortic Atherosclerosis (ICD10-I70.0). Appearance stable compared to 1 day prior. Electronically Signed   By: Lowella Grip III M.D.   On: 09/26/2018 07:46   Dg Chest Port 1 View  Result Date: 09/25/2018 CLINICAL DATA:  Respiratory failure requiring intubation. EXAM: PORTABLE CHEST 1 VIEW COMPARISON:  Radiograph of same day. FINDINGS: Stable cardiomediastinal silhouette. Atherosclerosis of thoracic aorta is noted. Endotracheal tube is seen projected over tracheal air shadow with distal tip 1 cm above the carina. Nasogastric tube is seen entering stomach. Right internal jugular catheter is noted with distal tip in expected position of cavoatrial junction. No pneumothorax is noted. Mild left pleural effusion is noted with associated atelectasis or infiltrate. Minimal right basilar subsegmental atelectasis is noted. Bony thorax is unremarkable. IMPRESSION: Endotracheal  and nasogastric tubes are in grossly good position. Mild left pleural effusion is noted with associated atelectasis or infiltrate. Mild right basilar subsegmental atelectasis. Right  internal jugular catheter in grossly good position. Electronically Signed   By: Marijo Conception, M.D.   On: 09/25/2018 18:36   Dg Chest Port 1 View  Result Date: 09/25/2018 CLINICAL DATA:  Altered mental status. EXAM: PORTABLE CHEST 1 VIEW COMPARISON:  09/23/2018 FINDINGS: Mild enlargement of the cardiopericardial silhouette, stable. No mediastinal or hilar masses. There is opacity in the left lung base obscuring hemidiaphragm and partly silhouetting the left heart border, associated with air bronchograms. There is bilateral interstitial thickening greater in the lower lungs, superimposed on chronically prominent bronchovascular markings. Hazy opacities noted at the right lung base consistent with a small pleural effusion. Probable left pleural effusion. No pneumothorax. Right internal jugular central venous line is stable from the prior exam. IMPRESSION: 1. Worsened lung aeration compared to the prior study. There is increased opacity at the left lung base consistent with atelectasis or pneumonia. There also small pleural effusions as well as lower lung zone interstitial thickening. A component of congestive heart failure with interstitial edema is suspected. 2. Mild stable cardiomegaly. Electronically Signed   By: Lajean Manes M.D.   On: 09/25/2018 16:11   Dg Chest Port 1 View  Result Date: 09/23/2018 CLINICAL DATA:  Shortness of breath and cough. EXAM: PORTABLE CHEST 1 VIEW COMPARISON:  09/22/2018 FINDINGS: Stable position of the right IJ approach central venous catheter. Cardiomediastinal silhouette is enlarged. Calcific atherosclerotic disease of the aorta. Mediastinal contours appear intact. Bilateral lower lobe peribronchial thickening. Osseous structures are without acute abnormality. Partially visualized spinal fusion. Soft tissues are grossly normal. IMPRESSION: Bilateral lower lobe linear peribronchial airspace consolidation which may represent bronchitis or early bronchopneumonia. Electronically  Signed   By: Fidela Salisbury M.D.   On: 09/23/2018 02:34   Dg Abd Portable 1v  Result Date: 10/01/2018 CLINICAL DATA:  63 year old female with C difficile colitis EXAM: PORTABLE ABDOMEN - 1 VIEW COMPARISON:  10/01/2018, 09/29/2018, CT abdomen 09/26/2018 FINDINGS: Gas within stomach, small bowel, colon. No abnormal distention. No significant formed stool burden. No unexpected radiopaque foreign body, soft tissue density, or calcification. Surgical changes of the thoracolumbar spine. Pelvic phleboliths. IMPRESSION: No acute plain film finding, with nonspecific bowel gas pattern. Electronically Signed   By: Corrie Mckusick D.O.   On: 10/01/2018 09:39    Time Spent in minutes  30   Lala Lund M.D on 10/03/2018 at 10:18 AM  To page go to [email or web address] - password [facility]

## 2018-10-03 NOTE — Progress Notes (Signed)
Inpatient Rehab Admissions:  Inpatient Rehab Consult received.  I met with patient at the bedside and spoke with her husband, Lynnae Sandhoff, over the phone for rehabilitation assessment and to discuss goals and expectations of an inpatient rehab admission.  Pt is agreeable and husband very supportive and interested in a potential rehab stay prior to pt returning home.  They have excellent family support.  RN states IV lopressor to be switched to PO.  Await further tolerance with therapies today.  Will follow up tomorrow for possible admission.   Signed: Shann Medal, PT, DPT Admissions Coordinator [phone] 10/03/18  9:55 AM

## 2018-10-03 NOTE — Progress Notes (Signed)
Physical Therapy Treatment Patient Details Name: [name] MRN: [id number] DOB: [birth year] Today's Date: 10/03/2018    History of Present Illness Pt is a 63 year old woman admitted to [facility] from [facility] on 3/22 with septic shock, AKI, +cdiff, uropathy 2/2 kidney stones. Pt intubated 3/24 to 3/28. PMH: DM, CHF with EF of 45%, afib.     PT Comments    Patient received in bed, very pleasant and with general flat affect but able to smile and joke with therapists today. She required MinA and Mod VC for sequencing for bed mobility, demonstrates significant improvement in sitting balance and able to maintain upright midline sitting with light min guard to distant S this session. Able to perform initial sit to stand with MinA in steady, then able to maintain static standing approximately 4 minutes with B UE support and cues for upright standing/reduced lean on stedy but very fatigued. Performed totalA transfer to chair in stedy and performed second sit to stand with min guard from stedy pads but only able to maintain for 2 minutes before becoming fatigued. She was left up in the recliner with all needs met and chair alarm active this morning.     Follow Up Recommendations  CIR     Equipment Recommendations  Other (comment)(tbd)    Recommendations for Other Services       Precautions / Restrictions Precautions Precautions: Fall Restrictions Weight Bearing Restrictions: No    Mobility  Bed Mobility Overal bed mobility: Needs Assistance Bed Mobility: Supine to Sit     Supine to sit: Min assist     General bed mobility comments: MinA and extended time, Mod VC for sequencing of transfer, once up able to maintain upright with distant S   Transfers Overall transfer level: Needs assistance   Transfers: Sit to/from Stand Sit to Stand: Min assist;Min guard         General transfer comment: MinA  from bed to standing in stedy, min guard when standing from stedy pads. Able  to maintain standing for approximately 4 minutes on first attempt, 2 minutes on second attempt and HR to 115   Ambulation/Gait             General Gait Details: unable to attempt   Stairs             Wheelchair Mobility    Modified Rankin (Stroke Patients Only)       Balance Overall balance assessment: Needs assistance Sitting-balance support: Feet supported;Bilateral upper extremity supported Sitting balance-Leahy Scale: Good Sitting balance - Comments: able to maintain static midline sitting with light min guard to distant S today    Standing balance support: Bilateral upper extremity supported;During functional activity Standing balance-Leahy Scale: Poor Standing balance comment: able to statically stand with hands on Stedy, cues to reduce leaning on stedy                             Cognition Arousal/Alertness: Awake/alert Behavior During Therapy: Flat affect Overall Cognitive Status: Within Functional Limits for tasks assessed                                 General Comments: Pt able to smile today with joking, continues to require extra time for processing      Exercises      General Comments        Pertinent  Vitals/Pain Pain Assessment: Faces Pain Score: 0-No pain Faces Pain Scale: No hurt Pain Intervention(s): Monitored during session;Limited activity within patient's tolerance    Home Living                      Prior Function            PT Goals (current goals can now be found in the care plan section) Acute Rehab PT Goals Patient Stated Goal: none stated PT Goal Formulation: With patient Time For Goal Achievement: 10/15/18 Potential to Achieve Goals: Fair Progress towards PT goals: Progressing toward goals    Frequency    Min 3X/week      PT Plan Current plan remains appropriate    Co-evaluation PT/OT/SLP Co-Evaluation/Treatment: Yes Reason for Co-Treatment: Complexity of the patient's  impairments (multi-system involvement);For patient/therapist safety PT goals addressed during session: Mobility/safety with mobility;Balance;Strengthening/ROM        AM-PAC PT "6 Clicks" Mobility   Outcome Measure  Help needed turning from your back to your side while in a flat bed without using bedrails?: A Little Help needed moving from lying on your back to sitting on the side of a flat bed without using bedrails?: A Little Help needed moving to and from a bed to a chair (including a wheelchair)?: A Lot Help needed standing up from a chair using your arms (e.g., wheelchair or bedside chair)?: A Lot Help needed to walk in hospital room?: Total Help needed climbing 3-5 steps with a railing? : Total 6 Click Score: 12    End of Session Equipment Utilized During Treatment: Gait belt Activity Tolerance: Patient limited by fatigue Patient left: in chair;with call bell/phone within reach;with chair alarm set   PT Visit Diagnosis: Unsteadiness on feet (R26.81);Other abnormalities of gait and mobility (R26.89);Muscle weakness (generalized) (M62.81);Difficulty in walking, not elsewhere classified (R26.2)     Time: 1610-9604 PT Time Calculation (min) (ACUTE ONLY): 35 min  Charges:  $Therapeutic Activity: 8-22 mins                     Deniece Ree PT, DPT, CBIS  Supplemental Physical Therapist [place]    Pager [phone] Acute Rehab Office [phone]

## 2018-10-03 NOTE — Progress Notes (Signed)
Modified Barium Swallow Progress Note  Patient Details  Name: [name] MRN: [id number] Date of Birth: 1955-07-25  Today's Date: 10/03/2018  Modified Barium Swallow completed.  Full report located under Chart Review in the Imaging Section.  Brief recommendations include the following:  Clinical Impression  Min-mild oropharyngeal dysphagia resulted in penetration with thin liquids due to decreased timing of laryngeal closure. One instance thin barium remained above vocal cords on anterior vestibular wall (trace) without sensation. Chin tuck consistently prevented airway intrusion during today's assessment without needing cues to perform after first several trials. Oral holding present possibly intentional. Recommend upgrade to thin liquids using chin tuck technique, Dys 3 (mechanical soft), pills whole in puree, straws allowed with small sips and continued ST for tolerance and texture upgrade when safe.        Swallow Evaluation Recommendations       SLP Diet Recommendations: Thin liquid;Dysphagia 3 (Mech soft) solids   Liquid Administration via: Cup;Straw   Medication Administration: Whole meds with puree   Supervision: Patient able to self feed;Intermittent supervision to cue for compensatory strategies   Compensations: Chin tuck;Slow rate;Small sips/bites(chin tuck with liquids)   Postural Changes: Seated upright at 90 degrees   Oral Care Recommendations: Oral care BID        Houston Siren 10/03/2018,11:35 AM   Orbie Pyo Colvin Caroli.Ed Risk analyst [phone] Office [phone]

## 2018-10-03 NOTE — Progress Notes (Signed)
  Speech Language Pathology Treatment: Dysphagia  Patient Details Name: [name] MRN: [id number] DOB: 28-Jul-1955 Today's Date: 10/03/2018 Time: 0946-1000 SLP Time Calculation (min) (ACUTE ONLY): 14 min  Assessment / Plan / Recommendation Clinical Impression  Suspicion of airway compromise continues with mildly delayed cough with trials thin water therefore instrumental assessment recommended. MBS scheduled this morning 10:30 to evaluate safest consistency liquids and most efficient texture.    HPI HPI: Pt is a 63 year old woman admitted to [facility] from [facility] on 3/22 with septic shock, AKI, +cdiff, uropathy 2/2 kidney stones. Pt intubated 3/24-3/28. PMH: DM, CHF with EF of 45%, afib.       SLP Plan  MBS       Recommendations  Diet recommendations: Nectar-thick liquid;Dysphagia 2 (fine chop) Liquids provided via: Cup;Straw Medication Administration: Whole meds with puree Supervision: Full supervision/cueing for compensatory strategies;Staff to assist with self feeding Compensations: Minimize environmental distractions;Slow rate;Small sips/bites Postural Changes and/or Swallow Maneuvers: Seated upright 90 degrees                Oral Care Recommendations: Oral care BID Follow up Recommendations: Inpatient Rehab SLP Visit Diagnosis: Dysphagia, unspecified (R13.10) Plan: MBS       GO                Houston Siren 10/03/2018, 10:17 AM   Orbie Pyo Colvin Caroli.Ed Risk analyst [phone] Office [phone]

## 2018-10-03 NOTE — Progress Notes (Signed)
ANTICOAGULATION CONSULT NOTE - Initial Consult  Pharmacy Consult for : restart Eliquis Indication: h/o chronic atrial fibrillation  Allergies  Allergen Reactions  . Ativan [Lorazepam]   . Other Nausea And Vomiting and Swelling    Shrimp if eat a lot  . Cleocin [Clindamycin Hcl] Other (See Comments)    "irritated my esophagus"  . Morphine And Related Nausea And Vomiting  . Pneumococcal Vaccines Other (See Comments)    Really high fever, and flu symptoms. MD instructed not to give  . Buprenorphine Hcl Nausea And Vomiting    Patient Measurements: Height: 4\' 11"  (149.9 cm) Weight: 167 lb 8.8 oz (76 kg) IBW/kg (Calculated) : 43.2   Vital Signs: Temp: 98.2 F (36.8 C) (04/01 0825) Temp Source: Oral (04/01 0825) BP: 151/83 (04/01 0442) Pulse Rate: 80 (04/01 0442)  Labs: Recent Labs    10/01/18 0538 10/02/18 0353 10/03/18 0405  HGB 8.3* 8.6* 8.4*  HCT 26.7* 27.6* 27.9*  PLT 92* 104* 103*  LABPROT 17.8*  --   --   INR 1.5*  --   --   CREATININE 1.59* 1.42* 1.17*    Estimated Creatinine Clearance: 43.7 mL/min (A) (by C-G formula based on SCr of 1.17 mg/dL (H)).   Medical History: Past Medical History:  Diagnosis Date  . Arthritis    "back, knees, arms, wrists" (05/15/2013)  . Asthma   . Breast cancer ([place])   . CHF (congestive heart failure) ([place])    "mild" (05/15/2013)  . Chronic bronchitis ([place])   . Chronic lower back pain   . Dysrhythmia    atrial fib/dr [facility]  . Family history of anesthesia complication    "my mother also had PONV" (05/15/2013)  . GERD (gastroesophageal reflux disease)   . Heart murmur   . High cholesterol   . Kidney stones   . Migraine    "haven't had one in the early 2000's" (05/15/2013)  . Pneumonia    "used to be chronic; last time I had it was 2013" (05/15/2013)  . PONV (postoperative nausea and vomiting)   . Recurrent UTI (urinary tract infection)    "from the continuous kidney stones; take Macrodantin  qd" (05/15/2013)  . Sepsis ([place]) 05   kidney stone infection  . Tension headache   . Type II diabetes mellitus (HCC)     Medications:  Medications Prior to Admission  Medication Sig Dispense Refill Last Dose  . acetaminophen (TYLENOL) 500 MG tablet Take 500 mg every 6 (six) hours as needed by mouth.   unk  . albuterol (PROVENTIL HFA;VENTOLIN HFA) 108 (90 Base) MCG/ACT inhaler Inhale 2 puffs into the lungs as needed for wheezing or shortness of breath. 1 Inhaler 0 unk  . ALPRAZolam (XANAX) 0.25 MG tablet Take 0.25 mg by mouth 3 (three) times daily as needed for anxiety.    09/20/2018  . atorvastatin (LIPITOR) 10 MG tablet Take 10 mg by mouth daily at 6 PM.   1 09/20/2018  . carvedilol (COREG) 6.25 MG tablet Take 6.25 mg by mouth 2 (two) times daily with a meal.    09/21/2018  . ELIQUIS 5 MG TABS tablet TAKE 1 TABLET BY MOUTH TWICE A DAY (Patient taking differently: Take 5 mg by mouth 2 (two) times daily. ) 60 tablet 5 09/21/2018  . flecainide (TAMBOCOR) 50 MG tablet Take 50 mg by mouth 2 (two) times daily.    09/21/2018  . fluticasone (FLONASE) 50 MCG/ACT nasal spray PLACE 1 SPRAY INTO BOTH NOSTRILS 2 (TWO) TIMES  DAILY. 16 g 0 Past Week at Unknown time  . glipiZIDE (GLUCOTROL XL) 5 MG 24 hr tablet Take 10 mg by mouth daily with breakfast.    09/21/2018  . ipratropium (ATROVENT) 0.06 % nasal spray Place 2 sprays into both nostrils 3 (three) times daily.   Past Week at Unknown time  . loratadine (CLARITIN) 10 MG tablet Take 10 mg by mouth daily as needed for allergies.   09/20/2018  . metFORMIN (GLUCOPHAGE) 1000 MG tablet Take 1,000 mg by mouth 2 (two) times daily with a meal.   09/21/2018  . montelukast (SINGULAIR) 10 MG tablet TAKE 1 TABLET BY MOUTH EVERYDAY AT BEDTIME (Patient taking differently: Take 10 mg by mouth at bedtime. TAKE 1 TABLET BY MOUTH EVERYDAY AT BEDTIME) 30 tablet 3 09/19/2018  . omeprazole (PRILOSEC) 20 MG capsule Take 20 mg by mouth daily.    09/21/2018  . sacubitril-valsartan  (ENTRESTO) 24-26 MG Take 1 tablet by mouth 2 (two) times daily. 60 tablet 3 09/21/2018  . sertraline (ZOLOFT) 50 MG tablet Take 50 mg by mouth daily as needed (anxiety).   5 Past Week at Unknown time  . sitaGLIPtin (JANUVIA) 100 MG tablet Take 100 mg by mouth daily.   09/21/2018    Assessment: 63 y.o female on Eliquis 5 mg BID pta (LD pta taken on 09/21/18).  Reversed with Eppie Gibson 09/23/18 for procedure.  Severe thrombocytopenia likley due to sepsis, has improved plts 69>104>103 improved, no bleeding noted.  Hgb 7.9>8.6>8.4  Age <80, wt >60kg, SCr <1.5  - will resume apixaban 5 mg bid   Plan:  Resume Eliquis 5 mg po BID Monitor for s/sx of bleeding, renal function changes.    Thank you for allowing pharmacy to be part of this patients care team. Nicole Cella, [place] Pharmacist [phone] Please check AMION for all [place] phone numbers After 10:00 PM, call [place] [phone] 10/03/2018,11:17 AM

## 2018-10-04 LAB — GLUCOSE, CAPILLARY
Glucose-Capillary: 109 mg/dL — ABNORMAL HIGH (ref 70–99)
Glucose-Capillary: 181 mg/dL — ABNORMAL HIGH (ref 70–99)
Glucose-Capillary: 102 mg/dL — ABNORMAL HIGH (ref 70–99)
Glucose-Capillary: 211 mg/dL — ABNORMAL HIGH (ref 70–99)

## 2018-10-04 LAB — CBC
HCT: 27.3 % — ABNORMAL LOW (ref 36.0–46.0)
Hemoglobin: 8.4 g/dL — ABNORMAL LOW (ref 12.0–15.0)
MCH: 29.1 pg (ref 26.0–34.0)
MCHC: 30.8 g/dL (ref 30.0–36.0)
MCV: 94.5 fL (ref 80.0–100.0)
Platelets: 103 10*3/uL — ABNORMAL LOW (ref 150–400)
RBC: 2.89 MIL/uL — ABNORMAL LOW (ref 3.87–5.11)
RDW: 19.5 % — ABNORMAL HIGH (ref 11.5–15.5)
WBC: 15 10*3/uL — ABNORMAL HIGH (ref 4.0–10.5)
nRBC: 0.5 % — ABNORMAL HIGH (ref 0.0–0.2)

## 2018-10-04 LAB — BASIC METABOLIC PANEL
Anion gap: 9 (ref 5–15)
BUN: 36 mg/dL — ABNORMAL HIGH (ref 8–23)
CO2: 29 mmol/L (ref 22–32)
Calcium: 8.5 mg/dL — ABNORMAL LOW (ref 8.9–10.3)
Chloride: 104 mmol/L (ref 98–111)
Creatinine, Ser: 1.09 mg/dL — ABNORMAL HIGH (ref 0.44–1.00)
GFR calc Af Amer: >60 mL/min (ref >60)
GFR calc non Af Amer: 54 mL/min — ABNORMAL LOW (ref >60)
Glucose, Bld: 155 mg/dL — ABNORMAL HIGH (ref 70–99)
Potassium: 4 mmol/L (ref 3.5–5.1)
Sodium: 142 mmol/L (ref 135–145)

## 2018-10-04 LAB — BLOOD GAS, ARTERIAL
Acid-Base Excess: 6.8 mmol/L — ABNORMAL HIGH (ref 0.0–2.0)
Bicarbonate: 31.4 mmol/L — ABNORMAL HIGH (ref 20.0–28.0)
O2 Content: 2 L/min
O2 Saturation: 97.9 %
PO2 ART: 113 mmHg — AB (ref 83.0–108.0)
Patient temperature: 98.6
pCO2 arterial: 50.1 mmHg — ABNORMAL HIGH (ref 32.0–48.0)
pH, Arterial: 7.413 (ref 7.350–7.450)

## 2018-10-04 LAB — MAGNESIUM: Magnesium: 1.6 mg/dL — ABNORMAL LOW (ref 1.7–2.4)

## 2018-10-04 MED ORDER — MAGNESIUM SULFATE 2 GM/50ML IV SOLN
2.0000 g | Freq: Once | INTRAVENOUS | Status: AC
  Administered 2018-10-04: 2 g via INTRAVENOUS

## 2018-10-04 MED ORDER — CEPHALEXIN 500 MG PO CAPS: 500.0000 mg | ORAL_CAPSULE | Freq: Three times a day (TID) | ORAL | 0 refills | Status: AC

## 2018-10-04 MED ORDER — PANTOPRAZOLE SODIUM 40 MG PO TBEC
40.0000 mg | DELAYED_RELEASE_TABLET | Freq: Every day | ORAL | Status: DC
  Administered 2018-10-04: 11:00:00 40 mg via ORAL
  Filled 2018-10-04: qty 1

## 2018-10-04 NOTE — Discharge Instructions (Signed)
Follow with Primary MD Enid Skeens., MD in 7 days   Get CBC, CMP, 2 view Chest X ray -  checked  by Primary MD in 5-7 days   Activity: As tolerated with Full fall precautions use walker/cane & assistance as needed  Disposition Home   Diet: Soft diet with feeding assistance and aspiration precautions.  Check CBGs QA CHS  Special Instructions: If you have smoked or chewed Tobacco  in the last 2 yrs please stop smoking, stop any regular Alcohol  and or any Recreational drug use.  On your next visit with your primary care physician please Get Medicines reviewed and adjusted.  Please request your Prim.MD to go over all Hospital Tests and Procedure/Radiological results at the follow up, please get all Hospital records sent to your Prim MD by signing hospital release before you go home.  If you experience worsening of your admission symptoms, develop shortness of breath, life threatening emergency, suicidal or homicidal thoughts you must seek medical attention immediately by calling 911 or calling your MD immediately  if symptoms less severe.  You Must read complete instructions/literature along with all the possible adverse reactions/side effects for all the Medicines you take and that have been prescribed to you. Take any new Medicines after you have completely understood and accpet all the possible adverse reactions/side effects.   Information on my medicine - ELIQUIS (apixaban)  This medication education was reviewed with me or my healthcare representative as part of my discharge preparation.  You were taking this medication prior to this hospital admission.   Why was Eliquis prescribed for you? Eliquis was prescribed for you to reduce the risk of a blood clot forming that can cause a stroke if you have a medical condition called atrial fibrillation (a type of irregular heartbeat).  What do You need to know about Eliquis ? Take your Eliquis TWICE DAILY - one tablet in the morning  and one tablet in the evening with or without food. If you have difficulty swallowing the tablet whole please discuss with your pharmacist how to take the medication safely.  Take Eliquis exactly as prescribed by your doctor and DO NOT stop taking Eliquis without talking to the doctor who prescribed the medication.  Stopping may increase your risk of developing a stroke.  Refill your prescription before you run out.  After discharge, you should have regular check-up appointments with your healthcare provider that is prescribing your Eliquis.  In the future your dose may need to be changed if your kidney function or weight changes by a significant amount or as you get older.  What do you do if you miss a dose? If you miss a dose, take it as soon as you remember on the same day and resume taking twice daily.  Do not take more than one dose of ELIQUIS at the same time to make up a missed dose.  Important Safety Information A possible side effect of Eliquis is bleeding. You should call your healthcare provider right away if you experience any of the following: ? Bleeding from an injury or your nose that does not stop. ? Unusual colored urine (red or dark brown) or unusual colored stools (red or black). ? Unusual bruising for unknown reasons. ? A serious fall or if you hit your head (even if there is no bleeding).  Some medicines may interact with Eliquis and might increase your risk of bleeding or clotting while on Eliquis. To help avoid this, consult your healthcare  provider or pharmacist prior to using any new prescription or non-prescription medications, including herbals, vitamins, non-steroidal anti-inflammatory drugs (NSAIDs) and supplements.  This website has more information on Eliquis (apixaban): http://www.eliquis.com/eliquis/home

## 2018-10-04 NOTE — Progress Notes (Signed)
Nsg Discharge Note  Admit Date:  09/23/2018 Discharge date: 10/04/2018   [name] to be D/C'd Home per MD order.  AVS completed.  Copy for chart, and copy for patient signed, and dated. Patient/caregiver able to verbalize understanding.  Discharge Medication: Allergies as of 10/04/2018      Reactions   Ativan [lorazepam]    Other Nausea And Vomiting, Swelling   Shrimp if eat a lot   Cleocin [clindamycin Hcl] Other (See Comments)   "irritated my esophagus"   Morphine And Related Nausea And Vomiting   Pneumococcal Vaccines Other (See Comments)   Really high fever, and flu symptoms. MD instructed not to give   Buprenorphine Hcl Nausea And Vomiting      Medication List    TAKE these medications   acetaminophen 500 MG tablet Commonly known as:  TYLENOL Take 500 mg every 6 (six) hours as needed by mouth.   albuterol 108 (90 Base) MCG/ACT inhaler Commonly known as:  PROVENTIL HFA;VENTOLIN HFA Inhale 2 puffs into the lungs as needed for wheezing or shortness of breath.   ALPRAZolam 0.25 MG tablet Commonly known as:  XANAX Take 0.25 mg by mouth 3 (three) times daily as needed for anxiety.   atorvastatin 10 MG tablet Commonly known as:  LIPITOR Take 10 mg by mouth daily at 6 PM.   carvedilol 6.25 MG tablet Commonly known as:  COREG Take 6.25 mg by mouth 2 (two) times daily with a meal.   cephALEXin 500 MG capsule Commonly known as:  KEFLEX Take 1 capsule (500 mg total) by mouth 3 (three) times daily for 10 days.   Eliquis 5 MG Tabs tablet Generic drug:  apixaban TAKE 1 TABLET BY MOUTH TWICE A DAY What changed:  how much to take   flecainide 50 MG tablet Commonly known as:  TAMBOCOR Take 50 mg by mouth 2 (two) times daily.   fluticasone 50 MCG/ACT nasal spray Commonly known as:  FLONASE PLACE 1 SPRAY INTO BOTH NOSTRILS 2 (TWO) TIMES DAILY.   glipiZIDE 5 MG 24 hr tablet Commonly known as:  GLUCOTROL XL Take 10 mg by mouth daily with breakfast.   ipratropium  0.06 % nasal spray Commonly known as:  ATROVENT Place 2 sprays into both nostrils 3 (three) times daily.   loratadine 10 MG tablet Commonly known as:  CLARITIN Take 10 mg by mouth daily as needed for allergies.   metFORMIN 1000 MG tablet Commonly known as:  GLUCOPHAGE Take 1,000 mg by mouth 2 (two) times daily with a meal.   montelukast 10 MG tablet Commonly known as:  SINGULAIR TAKE 1 TABLET BY MOUTH EVERYDAY AT BEDTIME What changed:  See the new instructions.   omeprazole 20 MG capsule Commonly known as:  PRILOSEC Take 20 mg by mouth daily.   sacubitril-valsartan 24-26 MG Commonly known as:  Entresto Take 1 tablet by mouth 2 (two) times daily.   sertraline 50 MG tablet Commonly known as:  ZOLOFT Take 50 mg by mouth daily as needed (anxiety).   sitaGLIPtin 100 MG tablet Commonly known as:  JANUVIA Take 100 mg by mouth daily.            Durable Medical Equipment  (From admission, onward)         Start     Ordered   10/04/18 1003  For home use only DME Walker rolling  [facility])  Once    Question:  Patient needs a walker to treat with the following condition  Answer:  UTI (urinary tract infection)   10/04/18 1003          Discharge Assessment: Vitals:   10/03/18 2130 10/04/18 0415  BP: 131/69 (!) 144/79  Pulse: 72 86  Resp: 18 18  Temp: 98.2 F (36.8 C) 98.5 F (36.9 C)  SpO2: 95% 94%   Skin clean, dry and intact without evidence of skin break down, no evidence of skin tears noted. IV catheter discontinued intact. Site without signs and symptoms of complications - no redness or edema noted at insertion site, patient denies c/o pain - only slight tenderness at site.  Dressing with slight pressure applied.  D/c Instructions-Education: Discharge instructions given to patient/family with verbalized understanding. D/c education completed with patient/family including follow up instructions, medication list, d/c activities limitations if indicated, with  other d/c instructions as indicated by MD - patient able to verbalize understanding, all questions fully answered. Patient instructed to return to ED, call 911, or call MD for any changes in condition.  Patient escorted via [place], and D/C home via private auto.  Giannamarie Paulus Margaretha Sheffield, RN 10/04/2018 1:19 PM

## 2018-10-04 NOTE — Care Management Important Message (Signed)
Important Message  Patient Details  Name: [name] MRN: [id number] Date of Birth: 10-13-1955   Medicare Important Message Given:  Yes    Orbie Pyo 10/04/2018, 3:26 PM

## 2018-10-04 NOTE — Discharge Summary (Signed)
[name] CVU:[id number] DOB: 1956-06-05 DOA: 09/23/2018  PCP: Enid Skeens., MD  Admit date: 09/23/2018  Discharge date: 10/04/2018  Admitted From: Home   Disposition:  Home   Recommendations for Outpatient Follow-up:   Follow up with PCP in 1-2 weeks  PCP Please obtain BMP/CBC, 2 view CXR in 1week,  (see Discharge instructions)   PCP Please follow up on the following pending results: CBC, BMP, magnesium level check in 1 week   Home Health: PT,RN,SLP,S work Equipment/Devices: Conservation officer, nature  Consultations: PCCM, Urology Discharge Condition: Stable   CODE STATUS: Full   Diet Recommendation: Heart Healthy Low Carb, Soft diet  Diet Order            DIET DYS 3 Room service appropriate? Yes; Fluid consistency: Thin  Diet effective now              CC - Sepsis   Brief history of present illness from the day of admission and additional interim summary    [name] is a 63 year old female with history of N7VJ, systolic HF EF 28%, Chr.AF on Eliquis who presents as a transfer from [place] for higher level of care for septic shock. Admitted with fever, right hydronephrosis/pyelonephritis and septic shock with gram-negative sepsis. There was some concern for coronavirus infection as she was recently at a wedding with 20 people (unknown if any positive exposures). She had a flu swab done that was negative. Covid tested at [facility], she was cleared for it by ID [name] at [facility].  Was treated for gram-negative sepsis and bacteremia, she was seen by urology ID and critical care.  She required endotracheal intubation and mechanical ventilation, she was transferred from ICU to my service on 10/01/2018.                                                                  Hospital Course   Septic shock from GNR  bacteremia, hemodynamically improved - blood cultures were positive at [facility] on 09/22/2018, received Rocephin, CT scans and ultrasounds done multiple times final conclusion was patient does not have an active ureteric obstruction or significant hydronephrosis, she was seen by urology on 3/22 and urology decided no procedures were needed.  She is hemodynamically better and afebrile although she still has significant white count, steroids have been tapered off sepsis pathophysiology clinically has resolved, she is currently on Rocephin and will be transitioned to Keflex for 3 more days upon discharge.  Acute hypoxic respiratory failure, R/o LLL atelectasis vs infiltrate/ aspiration - requiring endotracheal intubation and mechanical ventilation under pulmonary critical care along with IV antibiotics.  Has finished antibiotics per critical care on 09/30/2018.  Speech to evaluate, currently n.p.o.  Gentle hydration.  Covid 19 was ruled out by ID physician Dr. [name] on  09/30/2018.   Diarrhea with leukocytosis and antibiotic exposure.  Both C. difficile and GI pathogen panel were negative, Imodium diarrhea has completely resolved now.  Acute kidney Injury, improving, Obstructive uropathy- resolved - AKI is likely multifactorial partly due to obstructive uropathy from kidney stones and partly prerenal due to shock.  With hydration has resolved, seen by urology no intervention needed per urology, repeat imaging for hydronephrosis much improved likely passed stone.  Have recommended following with PCP in a week and urology outpatient in 1 to 2 weeks.  Severe thrombocytopenia, due to sepsis.  Could have had mild DIC.  INR was borderline.  Now with sepsis improved CBC and thrombocytopenia improving continue to monitor.  Question PCP to recheck CBC in a week.   Chronic atrial fibrillation with [place] vas 2 score of at least 3.  Baseline she was on Coreg, flecainide and Eliquis.   Resumed.   Chronic  systolic Heart failure EF 45%.  He is currently compensated, home medications resumed.   Concern for COVID 19 - Patient is very low risk. Her low grade fever was due to E. coli bacteremia. Test already obtained at OSH, COVID-19 Isolation was stopped on 3/24. Appreciate [name] input, note from 3/29 reviewed.  Clinically no suspicion of Covid 19.  Transaminitis, likely shock liver. Improving   DM2 - stable resume home regimen.     Discharge diagnosis     Active Problems:   Respiratory failure requiring intubation (HCC)   Septic shock (HCC)   AMS (altered mental status)   Hydronephrosis of right kidney   C. difficile colitis   Chronic systolic congestive heart failure (HCC)   Chronic UTI   Diabetes mellitus type 2 in obese (HCC)   Dysphagia   Infection   PAF (paroxysmal atrial fibrillation) ([place])   Tachypnea    Discharge instructions    Discharge Instructions    Discharge instructions   Complete by:  As directed    Follow with Primary MD Enid Skeens., MD in 7 days   Get CBC, CMP, 2 view Chest X ray -  checked  by Primary MD in 5-7 days   Activity: As tolerated with Full fall precautions use walker/cane & assistance as needed  Disposition Home   Diet: Soft diet with feeding assistance and aspiration precautions.  Check CBGs QA CHS.  Special Instructions: If you have smoked or chewed Tobacco  in the last 2 yrs please stop smoking, stop any regular Alcohol  and or any Recreational drug use.  On your next visit with your primary care physician please Get Medicines reviewed and adjusted.  Please request your Prim.MD to go over all Hospital Tests and Procedure/Radiological results at the follow up, please get all Hospital records sent to your Prim MD by signing hospital release before you go home.  If you experience worsening of your admission symptoms, develop shortness of breath, life threatening emergency, suicidal or homicidal thoughts you must seek medical  attention immediately by calling 911 or calling your MD immediately  if symptoms less severe.  You Must read complete instructions/literature along with all the possible adverse reactions/side effects for all the Medicines you take and that have been prescribed to you. Take any new Medicines after you have completely understood and accpet all the possible adverse reactions/side effects.   Increase activity slowly   Complete by:  As directed       Discharge Medications   Allergies as of 10/04/2018      Reactions  Ativan [lorazepam]    Other Nausea And Vomiting, Swelling   Shrimp if eat a lot   Cleocin [clindamycin Hcl] Other (See Comments)   "irritated my esophagus"   Morphine And Related Nausea And Vomiting   Pneumococcal Vaccines Other (See Comments)   Really high fever, and flu symptoms. MD instructed not to give   Buprenorphine Hcl Nausea And Vomiting      Medication List    TAKE these medications   acetaminophen 500 MG tablet Commonly known as:  TYLENOL Take 500 mg every 6 (six) hours as needed by mouth.   albuterol 108 (90 Base) MCG/ACT inhaler Commonly known as:  PROVENTIL HFA;VENTOLIN HFA Inhale 2 puffs into the lungs as needed for wheezing or shortness of breath.   ALPRAZolam 0.25 MG tablet Commonly known as:  XANAX Take 0.25 mg by mouth 3 (three) times daily as needed for anxiety.   atorvastatin 10 MG tablet Commonly known as:  LIPITOR Take 10 mg by mouth daily at 6 PM.   carvedilol 6.25 MG tablet Commonly known as:  COREG Take 6.25 mg by mouth 2 (two) times daily with a meal.   cephALEXin 500 MG capsule Commonly known as:  KEFLEX Take 1 capsule (500 mg total) by mouth 3 (three) times daily for 10 days.   Eliquis 5 MG Tabs tablet Generic drug:  apixaban TAKE 1 TABLET BY MOUTH TWICE A DAY What changed:  how much to take   flecainide 50 MG tablet Commonly known as:  TAMBOCOR Take 50 mg by mouth 2 (two) times daily.   fluticasone 50 MCG/ACT nasal  spray Commonly known as:  FLONASE PLACE 1 SPRAY INTO BOTH NOSTRILS 2 (TWO) TIMES DAILY.   glipiZIDE 5 MG 24 hr tablet Commonly known as:  GLUCOTROL XL Take 10 mg by mouth daily with breakfast.   ipratropium 0.06 % nasal spray Commonly known as:  ATROVENT Place 2 sprays into both nostrils 3 (three) times daily.   loratadine 10 MG tablet Commonly known as:  CLARITIN Take 10 mg by mouth daily as needed for allergies.   metFORMIN 1000 MG tablet Commonly known as:  GLUCOPHAGE Take 1,000 mg by mouth 2 (two) times daily with a meal.   montelukast 10 MG tablet Commonly known as:  SINGULAIR TAKE 1 TABLET BY MOUTH EVERYDAY AT BEDTIME What changed:  See the new instructions.   omeprazole 20 MG capsule Commonly known as:  PRILOSEC Take 20 mg by mouth daily.   sacubitril-valsartan 24-26 MG Commonly known as:  Entresto Take 1 tablet by mouth 2 (two) times daily.   sertraline 50 MG tablet Commonly known as:  ZOLOFT Take 50 mg by mouth daily as needed (anxiety).   sitaGLIPtin 100 MG tablet Commonly known as:  JANUVIA Take 100 mg by mouth daily.            Durable Medical Equipment  (From admission, onward)         Start     Ordered   10/04/18 1003  For home use only DME Walker rolling  [facility])  Once    Question:  Patient needs a walker to treat with the following condition  Answer:  UTI (urinary tract infection)   10/04/18 1003          Follow-up Information    [name], [name]., MD. Schedule an appointment as soon as possible for a visit in 1 week(s).   Specialty:  Family Medicine Contact information: [address] [address] [phone]  [facility]. Schedule an appointment as soon as possible for a visit in 1 week(s).   Why:  Kidney stone Contact information: [place] [phone]          Major procedures and Radiology Reports - PLEASE review detailed and final  reports thoroughly  -     3/21 R IJ CVC >> 3/22 R radial Aline >> 3/24 ETT >> 3/24 foley >> 3/29>> Rectal tube  CT abdomen and pelvis 3/20 1-26mm right UPJ stone with moderate right hydronephrosis and prominent right perinephrid edema 70mm non obstructing stone upper pole right kidney  Abdominal Ultrasound   Probable fatty infiltration of liver  Moderate hydronephrosis   Echo 08/2018 1. The left ventricle has decreased systolic function with an ejection fraction of 40-45%. The cavity size was normal. There is moderate to severe concentric left ventricular hypertrophy. 2. The right ventricle has normal systolic function. The cavity was normal. There is no increase in right ventricular wall thickness. 3. The tricuspid valve is normal in structure. 4. The pulmonic valve was normal in structure. 5. Right atrial pressure is estimated at 3 mmHg. 6. The aortic root, ascending aorta and aortic arch are normal in size and structure. 7. The aortic valve is tricuspid. 8. The mitral valve is normal in structure with mild MR  Repeat renal ultrasound 3/22>> resolved hydronephrosis  CT abd / pelvis 3/25 >>no evidence of bowel wall thickening, distention, inflammatory changes. Stomach normal. Mild right hydronephrosis with a 1 to 2 mm nonobstructing right renal stone. The previously seen UPJ calculus is no longer present     Ct Abdomen Pelvis Wo Contrast  Result Date: 09/26/2018 CLINICAL DATA:  Sepsis EXAM: CT ABDOMEN AND PELVIS WITHOUT CONTRAST TECHNIQUE: Multidetector CT imaging of the abdomen and pelvis was performed following the standard protocol without IV contrast. COMPARISON:  09/21/2018 FINDINGS: Lower chest: Small bilateral pleural effusions. Bibasilar airspace disease. Hepatobiliary: No focal liver abnormality is seen. Contrast fills the entirety of the gallbladder. No gallstones, gallbladder wall thickening, or biliary dilatation. Small amount of perihepatic ascites.  Pancreas: Unremarkable. No pancreatic ductal dilatation or surrounding inflammatory changes. Spleen: Normal in size without focal abnormality. Adrenals/Urinary Tract: Adrenal glands are unremarkable. Mild right hydronephrosis. 1-2 mm nonobstructing right renal calculus. Previously seen right UPJ calculus is not seen on the current exam. No other ureteral calculi. No bladder calculi. Foley catheter within the bladder. Stomach/Bowel: Stomach is within normal limits. No evidence of bowel wall thickening, distention, or inflammatory changes. Rectal drainage tube is present. Vascular/Lymphatic: Abdominal aortic atherosclerosis. No lymphadenopathy. Reproductive: Uterus and bilateral adnexa are unremarkable. Other: Mild anasarca. Small amount pelvic free fluid. No drainable fluid collection. Musculoskeletal: No acute osseous abnormality. No aggressive osseous lesion. Posterior lumbar fusion and decompression from T10 through S1. Mild osteoarthritis of bilateral sacroiliac joints. IMPRESSION: 1. Mild right hydronephrosis. 1-2 mm nonobstructing right renal calculus. Previously seen right UPJ calculus is not seen on the current exam. No other ureteral calculi. 2. Mild anasarca 3. Small bilateral pleural effusions. Bibasilar airspace disease which may reflect atelectasis versus pneumonia. Electronically Signed   By: Kathreen Devoid   On: 09/26/2018 18:56   Dg Abd 1 View  Result Date: 09/25/2018 CLINICAL DATA:  63 year old female status post intubation for airway protection. Acute renal failure. EXAM: ABDOMEN - 1 VIEW COMPARISON:  Portable chest 1700 hours today. CT Abdomen and Pelvis 09/21/2018. FINDINGS: Portable AP semi upright view at 1715 hours. Enteric tube courses to the  left abdomen with side hole at the level of the gastric body. Stable confluent opacity at the left lung base. Paucity of bowel gas. Calcified aortic atherosclerosis. Extensive thoracic and lumbar posterior spinal fusion hardware again noted. IMPRESSION:  Enteric tube placed, side hole at the level of the gastric body. Paucity of bowel gas. Electronically Signed   By: Genevie Ann M.D.   On: 09/25/2018 19:59   US Renal  Result Date: 09/23/2018 CLINICAL DATA:  Right sided hydronephrosis. EXAM: RENAL / URINARY TRACT ULTRASOUND COMPLETE COMPARISON:  CT 09/21/2018 and right upper quadrant ultrasound 09/21/2018 FINDINGS: Right Kidney: Renal measurements: 5.0 x 5.1 x 12.7 cm = volume: 167 mL . Echogenicity within normal limits. Prominence of the right renal pelvis without significant hydronephrosis. No focal mass. Left Kidney: Renal measurements: 5.1 x 5.7 x 11.6 cm = volume: 176 mL. Echogenicity within normal limits. Minimal fullness of the left renal pelvis. No focal mass. Bladder: Bladder is contracted. Small amount of free perihepatic fluid is visualized. IMPRESSION: Normal size kidneys. Prominence of the right renal pelvis without significant hydronephrosis. Minimal fullness of the left renal pelvis. Small amount of perihepatic fluid. Electronically Signed   By: Marin Olp M.D.   On: 09/23/2018 10:29   Dg Chest Port 1 View  Result Date: 10/01/2018 CLINICAL DATA:  Patient admitted 09/23/2018 and septic shock. EXAM: PORTABLE CHEST 1 VIEW COMPARISON:  Single-view of the chest 09/29/2018 and 09/27/2018. FINDINGS: Endotracheal tube and NG tube have been removed. Right IJ approach central venous catheter remains in place. Small left effusion and airspace disease in the left mid and lower lung zones has improved. There is increased right basilar atelectasis. Heart size is upper normal. No pneumothorax. IMPRESSION: Increased right basilar atelectasis. Improved left pleural effusion and basilar airspace disease. Electronically Signed   By: Inge Rise M.D.   On: 10/01/2018 07:36   Dg Chest Port 1 View  Result Date: 09/29/2018 CLINICAL DATA:  Acute respiratory failure with hypoxia EXAM: PORTABLE CHEST 1 VIEW COMPARISON:  09/27/2018 FINDINGS: Endotracheal tube  terminates 4 cm above the carina. Small layering left pleural effusion. Right lung is essentially clear, with right lower lobe scarring/atelectasis. Left lower lobe opacity, likely atelectasis. No pneumothorax. Cardiomegaly.  Thoracic aortic atherosclerosis. Enteric tube courses into the mid gastric body. Lower thoracic/upper lumbar spine fixation hardware, incompletely visualized. Right IJ venous catheter terminates at the cavoatrial junction. IMPRESSION: Endotracheal tube terminates 4 cm above the carina. Additional support apparatus as above. Small layering left pleural effusion. Left lower lobe opacity, likely atelectasis. Electronically Signed   By: Julian Hy M.D.   On: 09/29/2018 07:29   Dg Chest Port 1 View  Result Date: 09/27/2018 CLINICAL DATA:  Respiratory failure. EXAM: PORTABLE CHEST 1 VIEW COMPARISON:  09/26/2018 FINDINGS: Endotracheal tube terminates 3 cm above the carina. Enteric tube courses into the stomach. Right jugular catheter terminates over the lower SVC. The cardiomediastinal silhouette is unchanged. Left basilar consolidation has mildly improved. There is a persistent small left pleural effusion. Mild atelectasis is noted in the right lung base. No pneumothorax is identified. IMPRESSION: Mildly improved left basilar aeration. Persistent small left pleural effusion. Electronically Signed   By: Logan Bores M.D.   On: 09/27/2018 08:18   Dg Chest Port 1 View  Result Date: 09/26/2018 CLINICAL DATA:  Hypoxia EXAM: PORTABLE CHEST 1 VIEW COMPARISON:  September 25, 2018 FINDINGS: Endotracheal tube tip is 1.9 cm above the carina. Nasogastric tube tip and side port below the diaphragm. Central catheter tip is  at cavoatrial junction. No pneumothorax. There is persistent airspace consolidation in the left lower lobe. There are small pleural effusions bilaterally. There is no consolidation on the right. Heart size and pulmonary vascularity within normal limits. No adenopathy. There is aortic  atherosclerosis. There is postoperative change in the lower thoracic and visualized lumbar regions. There are surgical clips in the left breast region. IMPRESSION: Tube and catheter positions as described without evident pneumothorax. Consolidation left lower lobe, likely due to a combination of atelectasis and pneumonia or possibly aspiration. There are small pleural effusions bilaterally. Stable cardiac silhouette. Aortic Atherosclerosis (ICD10-I70.0). Appearance stable compared to 1 day prior. Electronically Signed   By: Lowella Grip III M.D.   On: 09/26/2018 07:46   Dg Chest Port 1 View  Result Date: 09/25/2018 CLINICAL DATA:  Respiratory failure requiring intubation. EXAM: PORTABLE CHEST 1 VIEW COMPARISON:  Radiograph of same day. FINDINGS: Stable cardiomediastinal silhouette. Atherosclerosis of thoracic aorta is noted. Endotracheal tube is seen projected over tracheal air shadow with distal tip 1 cm above the carina. Nasogastric tube is seen entering stomach. Right internal jugular catheter is noted with distal tip in expected position of cavoatrial junction. No pneumothorax is noted. Mild left pleural effusion is noted with associated atelectasis or infiltrate. Minimal right basilar subsegmental atelectasis is noted. Bony thorax is unremarkable. IMPRESSION: Endotracheal and nasogastric tubes are in grossly good position. Mild left pleural effusion is noted with associated atelectasis or infiltrate. Mild right basilar subsegmental atelectasis. Right internal jugular catheter in grossly good position. Electronically Signed   By: Marijo Conception, M.D.   On: 09/25/2018 18:36   Dg Chest Port 1 View  Result Date: 09/25/2018 CLINICAL DATA:  Altered mental status. EXAM: PORTABLE CHEST 1 VIEW COMPARISON:  09/23/2018 FINDINGS: Mild enlargement of the cardiopericardial silhouette, stable. No mediastinal or hilar masses. There is opacity in the left lung base obscuring hemidiaphragm and partly silhouetting  the left heart border, associated with air bronchograms. There is bilateral interstitial thickening greater in the lower lungs, superimposed on chronically prominent bronchovascular markings. Hazy opacities noted at the right lung base consistent with a small pleural effusion. Probable left pleural effusion. No pneumothorax. Right internal jugular central venous line is stable from the prior exam. IMPRESSION: 1. Worsened lung aeration compared to the prior study. There is increased opacity at the left lung base consistent with atelectasis or pneumonia. There also small pleural effusions as well as lower lung zone interstitial thickening. A component of congestive heart failure with interstitial edema is suspected. 2. Mild stable cardiomegaly. Electronically Signed   By: Lajean Manes M.D.   On: 09/25/2018 16:11   Dg Chest Port 1 View  Result Date: 09/23/2018 CLINICAL DATA:  Shortness of breath and cough. EXAM: PORTABLE CHEST 1 VIEW COMPARISON:  09/22/2018 FINDINGS: Stable position of the right IJ approach central venous catheter. Cardiomediastinal silhouette is enlarged. Calcific atherosclerotic disease of the aorta. Mediastinal contours appear intact. Bilateral lower lobe peribronchial thickening. Osseous structures are without acute abnormality. Partially visualized spinal fusion. Soft tissues are grossly normal. IMPRESSION: Bilateral lower lobe linear peribronchial airspace consolidation which may represent bronchitis or early bronchopneumonia. Electronically Signed   By: Fidela Salisbury M.D.   On: 09/23/2018 02:34   Dg Abd Portable 1v  Result Date: 10/01/2018 CLINICAL DATA:  63 year old female with C difficile colitis EXAM: PORTABLE ABDOMEN - 1 VIEW COMPARISON:  10/01/2018, 09/29/2018, CT abdomen 09/26/2018 FINDINGS: Gas within stomach, small bowel, colon. No abnormal distention. No significant formed stool burden. No unexpected  radiopaque foreign body, soft tissue density, or calcification. Surgical  changes of the thoracolumbar spine. Pelvic phleboliths. IMPRESSION: No acute plain film finding, with nonspecific bowel gas pattern. Electronically Signed   By: Corrie Mckusick D.O.   On: 10/01/2018 09:39   Dg Swallowing Func-speech Pathology  Result Date: 10/03/2018 Objective Swallowing Evaluation: Type of Study: MBS-Modified Barium Swallow Study  Patient Details Name: KELECHI ASTARITA MRN: [id number] Date of Birth: 1955/09/25 Today's Date: 10/03/2018 Time: SLP Start Time (ACUTE ONLY): 54 -SLP Stop Time (ACUTE ONLY): 4098 SLP Time Calculation (min) (ACUTE ONLY): 15 min Past Medical History: Past Medical History: Diagnosis Date  Arthritis   "back, knees, arms, wrists" (05/15/2013)  Asthma   Breast cancer ([place])   CHF (congestive heart failure) ([place])   "mild" (05/15/2013)  Chronic bronchitis ([place])   Chronic lower back pain   Dysrhythmia   atrial fib/dr [facility]  Family history of anesthesia complication   "my mother also had PONV" (05/15/2013)  GERD (gastroesophageal reflux disease)   Heart murmur   High cholesterol   Kidney stones   Migraine   "haven't had one in the early 2000's" (05/15/2013)  Pneumonia   "used to be chronic; last time I had it was 2013" (05/15/2013)  PONV (postoperative nausea and vomiting)   Recurrent UTI (urinary tract infection)   "from the continuous kidney stones; take Macrodantin qd" (05/15/2013)  Sepsis ([place]) 05  kidney stone infection  Tension headache   Type II diabetes mellitus ([place])  Past Surgical History: Past Surgical History: Procedure Laterality Date  BILATERAL OOPHORECTOMY Bilateral 2006  "cause I needed to 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" (05/15/2013)  BREAST BIOPSY Left 02/2001  BREAST LUMPECTOMY Left 02/2001  BREAST LUMPECTOMY Left 09/23/2013  Procedure: LEFT LUMPECTOMY WITH SPECIMEN MAMMOGRAM;  Surgeon: Adin Hector, MD;  Location: [place];  Service: General;  Laterality: Left;  COLONOSCOPY     DIAGNOSTIC LAPAROSCOPY    cyst-near ovary  LITHOTRIPSY    "2-3 times prior to 2002" (05/15/2013)  SPINAL FUSION N/A 05/08/2013  Procedure: T9-S1 INSTRUMENTED FUSION T12 -S1 DECOMPRESSION;  Surgeon: Melina Schools, MD;  Location: [place];  Service: Orthopedics;  Laterality: N/A;  TUBAL LIGATION Bilateral 1985 HPI: Pt is a 63 year old woman admitted [facility] on 3/22 with septic shock, AKI, +cdiff, uropathy 2/2 kidney stones. Pt intubated 3/24-3/28. PMH: DM, CHF with EF of 45%, afib.  Subjective: alert, confused Assessment / Plan / Recommendation CHL IP CLINICAL IMPRESSIONS 10/03/2018 Clinical Impression Min-mild oropharyngeal dysphagia resulted in penetration with thin liquids due to decreased timing of laryngeal closure. One instance thin barium remained above vocal cords on anterior vestibular wall (trace) without sensation. Chin tuck consistently prevented airway intrusion during today's assessment without needing cues to perform after first several trials. Oral holding present possibly intentional. Recommend upgrade to thin liquids using chin tuck technique, Dys 3 (mechanical soft), pills whole in puree, straws allowed with small sips and continued ST for tolerance and texture upgrade when safe.      SLP Visit Diagnosis Dysphagia, pharyngeal phase (R13.13) Attention and concentration deficit following -- Frontal lobe and executive function deficit following -- Impact on safety and function Mild aspiration risk   CHL IP TREATMENT RECOMMENDATION 10/03/2018 Treatment Recommendations Therapy as outlined in treatment plan below   Prognosis 10/03/2018 Prognosis for Safe Diet Advancement Good Barriers to Reach Goals -- Barriers/Prognosis Comment -- CHL IP DIET RECOMMENDATION 10/03/2018 SLP Diet Recommendations Thin liquid;Dysphagia  3 (Mech soft) solids Liquid Administration via Cup;Straw Medication Administration Whole meds with puree Compensations Chin tuck;Slow rate;Small sips/bites Postural Changes Seated  upright at 90 degrees   CHL IP OTHER RECOMMENDATIONS 10/03/2018 Recommended Consults -- Oral Care Recommendations Oral care BID Other Recommendations --   CHL IP FOLLOW UP RECOMMENDATIONS 10/03/2018 Follow up Recommendations Inpatient Rehab   CHL IP FREQUENCY AND DURATION 10/03/2018 Speech Therapy Frequency (ACUTE ONLY) min 2x/week Treatment Duration 2 weeks      CHL IP ORAL PHASE 10/03/2018 Oral Phase Impaired Oral - Pudding Teaspoon -- Oral - Pudding Cup -- Oral - Honey Teaspoon -- Oral - Honey Cup -- Oral - Nectar Teaspoon -- Oral - Nectar Cup Delayed oral transit Oral - Nectar Straw -- Oral - Thin Teaspoon -- Oral - Thin Cup Delayed oral transit Oral - Thin Straw -- Oral - Puree -- Oral - Mech Soft -- Oral - Regular WFL Oral - Multi-Consistency -- Oral - Pill -- Oral Phase - Comment --  CHL IP PHARYNGEAL PHASE 10/03/2018 Pharyngeal Phase Impaired Pharyngeal- Pudding Teaspoon -- Pharyngeal -- Pharyngeal- Pudding Cup -- Pharyngeal -- Pharyngeal- Honey Teaspoon -- Pharyngeal -- Pharyngeal- Honey Cup -- Pharyngeal -- Pharyngeal- Nectar Teaspoon -- Pharyngeal -- Pharyngeal- Nectar Cup WFL Pharyngeal -- Pharyngeal- Nectar Straw -- Pharyngeal -- Pharyngeal- Thin Teaspoon -- Pharyngeal -- Pharyngeal- Thin Cup Penetration/Aspiration during swallow;Reduced airway/laryngeal closure Pharyngeal Material enters airway, remains ABOVE vocal cords then ejected out;Material enters airway, remains ABOVE vocal cords and not ejected out Pharyngeal- Thin Straw -- Pharyngeal -- Pharyngeal- Puree -- Pharyngeal -- Pharyngeal- Mechanical Soft -- Pharyngeal -- Pharyngeal- Regular WFL Pharyngeal -- Pharyngeal- Multi-consistency -- Pharyngeal -- Pharyngeal- Pill -- Pharyngeal -- Pharyngeal Comment --  CHL IP CERVICAL ESOPHAGEAL PHASE 10/03/2018 Cervical Esophageal Phase WFL Pudding Teaspoon -- Pudding Cup -- Honey Teaspoon -- Honey Cup -- Nectar Teaspoon -- Nectar Cup -- Nectar Straw -- Thin Teaspoon -- Thin Cup -- Thin Straw -- Puree -- Mechanical  Soft -- Regular -- Multi-consistency -- Pill -- Cervical Esophageal Comment -- [place] Siren 10/03/2018, 11:34 AM Orbie Pyo Colvin Caroli.Ed Risk analyst [phone] Office [phone]               Micro Results     Recent Results (from the past 240 hour(s))  C difficile quick scan w PCR reflex     Status: None   Collection Time: 09/27/18 10:48 AM  Result Value Ref Range Status   C Diff antigen NEGATIVE NEGATIVE Final   C Diff toxin NEGATIVE NEGATIVE Final   C Diff interpretation No C. difficile detected.  Final    Comment: Performed at [facility], [place] [address], [place], [place] [address]  C difficile quick scan w PCR reflex     Status: None   Collection Time: 10/01/18  9:27 AM  Result Value Ref Range Status   C Diff antigen NEGATIVE NEGATIVE Final   C Diff toxin NEGATIVE NEGATIVE Final   C Diff interpretation No C. difficile detected.  Final    Comment: Performed at [facility], [place] [address], [place], [place] [address]  Gastrointestinal Panel by PCR , Stool     Status: None   Collection Time: 10/01/18  7:36 PM  Result Value Ref Range Status   Campylobacter species NOT DETECTED NOT DETECTED Final   Plesimonas shigelloides NOT DETECTED NOT DETECTED Final   Salmonella species NOT DETECTED NOT DETECTED Final   Yersinia enterocolitica NOT DETECTED NOT DETECTED Final  Vibrio species NOT DETECTED NOT DETECTED Final   Vibrio cholerae NOT DETECTED NOT DETECTED Final   Enteroaggregative E coli (EAEC) NOT DETECTED NOT DETECTED Final   Enteropathogenic E coli (EPEC) NOT DETECTED NOT DETECTED Final   Enterotoxigenic E coli (ETEC) NOT DETECTED NOT DETECTED Final   Shiga like toxin producing E coli (STEC) NOT DETECTED NOT DETECTED Final   Shigella/Enteroinvasive E coli (EIEC) NOT DETECTED NOT DETECTED Final   Cryptosporidium NOT DETECTED NOT DETECTED Final   Cyclospora cayetanensis NOT DETECTED NOT DETECTED Final   Entamoeba histolytica NOT  DETECTED NOT DETECTED Final   Giardia lamblia NOT DETECTED NOT DETECTED Final   Adenovirus F40/41 NOT DETECTED NOT DETECTED Final   Astrovirus NOT DETECTED NOT DETECTED Final   Norovirus GI/GII NOT DETECTED NOT DETECTED Final   Rotavirus A NOT DETECTED NOT DETECTED Final   Sapovirus (I, II, IV, and V) NOT DETECTED NOT DETECTED Final    Comment: Performed at [facility], [address], [place], [place] [address]    Today   Subjective    Nia Nathaniel today has no headache,no chest abdominal pain,no new weakness tingling or numbness, feels much better     Objective   Blood pressure (!) 144/79, pulse 86, temperature 98.5 F (36.9 C), temperature source Oral, resp. rate 18, height 4\' 11"  (1.499 m), weight 73.3 kg, SpO2 94 %.   Intake/Output Summary (Last 24 hours) at 10/04/2018 1008 Last data filed at 10/04/2018 0617 Gross per 24 hour  Intake 340 ml  Output 1300 ml  Net -960 ml    Exam Awake Alert, Oriented x 3, No new F.N deficits, Normal affect [place].AT,PERRAL Supple Neck,No JVD, No cervical lymphadenopathy appriciated.  Symmetrical Chest wall movement, Good air movement bilaterally, CTAB RRR,No Gallops,Rubs or new Murmurs, No Parasternal Heave +ve B.Sounds, Abd Soft, Non tender, No organomegaly appriciated, No rebound -guarding or rigidity. No Cyanosis, Clubbing or edema, No new Rash or bruise   Data Review   CBC w Diff:  Lab Results  Component Value Date   WBC 15.0 (H) 10/04/2018   HGB 8.4 (L) 10/04/2018   HGB 11.0 (L) 08/20/2018   HGB 11.4 (L) 05/22/2017   HGB 12.9 03/07/2007   HCT 27.3 (L) 10/04/2018   HCT 34.6 (L) 05/22/2017   HCT 36.2 03/07/2007   PLT 103 (L) 10/04/2018   PLT 289 08/20/2018   PLT 252 05/22/2017   PLT 251 03/07/2007   LYMPHOPCT 4 09/28/2018   LYMPHOPCT 19.9 05/22/2017   LYMPHOPCT 26.7 03/07/2007   BANDSPCT 0 09/28/2018   MONOPCT 4 09/28/2018   MONOPCT 6.0 05/22/2017   MONOPCT 5.2 03/07/2007   EOSPCT 0 09/28/2018   EOSPCT 1.8  05/22/2017   EOSPCT 2.9 03/07/2007   BASOPCT 0 09/28/2018   BASOPCT 0.4 05/22/2017   BASOPCT 0.5 03/07/2007    CMP:  Lab Results  Component Value Date   NA 142 10/04/2018   NA 140 08/31/2018   NA 144 04/17/2017   NA 141 04/18/2016   K 4.0 10/04/2018   K 4.6 04/17/2017   K 4.4 04/18/2016   CL 104 10/04/2018   CL 109 (H) 04/17/2017   CO2 29 10/04/2018   CO2 23 04/17/2017   CO2 18 (L) 04/18/2016   BUN 36 (H) 10/04/2018   BUN 19 08/31/2018   BUN 28 (H) 04/17/2017   BUN 27.3 (H) 04/18/2016   CREATININE 1.09 (H) 10/04/2018   CREATININE 1.3 (H) 04/17/2017   CREATININE 1.4 (H) 04/18/2016   PROT 5.8 (  L) 10/03/2018   PROT 7.4 04/17/2017   PROT 7.0 04/18/2016   ALBUMIN 2.3 (L) 10/03/2018   ALBUMIN 3.7 04/17/2017   ALBUMIN 3.6 04/18/2016   BILITOT 1.6 (H) 10/03/2018   BILITOT 0.40 04/17/2017   BILITOT <0.22 04/18/2016   ALKPHOS 283 (H) 10/03/2018   ALKPHOS 62 04/17/2017   ALKPHOS 68 04/18/2016   AST 46 (H) 10/03/2018   AST 24 04/17/2017   AST 14 04/18/2016   ALT 131 (H) 10/03/2018   ALT 20 04/17/2017   ALT 23 04/18/2016  .   Total Time in preparing paper work, data evaluation and todays exam - 69 minutes  Lala Lund M.D on 10/04/2018 at 10:08 AM  [facility]   Office  [phone]

## 2018-10-04 NOTE — Progress Notes (Signed)
Physical Therapy Treatment Patient Details Name: [name] MRN: [id number] DOB: 06/11/1956 Today's Date: 10/04/2018    History of Present Illness Pt is a 63 year old woman admitted to [facility] from [facility] on 3/22 with septic shock, AKI, +cdiff, uropathy 2/2 kidney stones. Pt intubated 3/24 to 3/28. PMH: DM, CHF with EF of 45%, afib.     PT Comments    Discussed functional status and PT concerns with return home versus inpatient rehab with MD, following this conversation MD continuing ahead with plan for patient to return home. Patient received OOB in chair and willing to participate in PT today. Continues to require MinA/Mod cues to boost to full standing position with RW and able to gait train 42f today before becoming fatigued. Attempted stair navigation but patient unable due to combination of anxiety, fatigue, and muscle weakness. She was left up in recliner with all needs met and chair alarm active. Discussed home set up with husband who reports they now have a ramp, confirms patient does have WC and electric scooter she can use at home. [facility] [place] [facility] functional [facility] and general safety this afternoon, RN to page if timing of DC changes. Patient returning home per MD orders however from physical therapy stand point patient continues to be in need of skilled PT services in an intensive rehabilitation setting and is at high risk of adverse outcomes such as fall, skin breakdown, reduced functional mobility, increased muscle weakness and decline in functional mobility, and cognitive decline with return home/without intensive skilled therapy intervention.   Follow Up Recommendations  CIR     Equipment Recommendations  Other (comment);Rolling walker with 5" wheels(TBD )    Recommendations for Other Services       Precautions / Restrictions Precautions Precautions: Fall Restrictions Weight Bearing Restrictions:  No    Mobility  Bed Mobility               General bed mobility comments: OOB in chair   Transfers Overall transfer level: Needs assistance Equipment used: Rolling walker (2 wheeled) Transfers: Sit to/from Stand Sit to Stand: Min assist         General transfer comment: light MinA to boost to standing position in stedy today, extended time and cues for safety and hand placement   Ambulation/Gait Ambulation/Gait assistance: Min guard Gait Distance (Feet): 8 Feet Assistive device: Rolling walker (2 wheeled) Gait Pattern/deviations: Step-through pattern;Decreased step length - right;Decreased step length - left;Decreased dorsiflexion - right;Decreased dorsiflexion - left     General Gait Details: very easily fatigued, only able to tolerate gait x865ftoday but steady and safe with RW    Stairs             Wheelchair Mobility    Modified Rankin (Stroke Patients Only)       Balance Overall balance assessment: Needs assistance Sitting-balance support: Feet supported;Bilateral upper extremity supported Sitting balance-Leahy Scale: Good Sitting balance - Comments: able to maintain static midline sitting with light min guard to distant S today    Standing balance support: Bilateral upper extremity supported;During functional activity Standing balance-Leahy Scale: Poor Standing balance comment: heavy reliance on B UE support                             Cognition Arousal/Alertness: Awake/alert Behavior During Therapy: Flat affect Overall Cognitive Status: Within Functional Limits for tasks assessed  General Comments: Increased time for processing for all questions and commads, able to laugh at PT jokes and make jokes with PT today       Exercises      General Comments        Pertinent Vitals/Pain Pain Assessment: Faces Pain Score: 0-No pain Faces Pain Scale: No hurt Pain Intervention(s):  Monitored during session;Limited activity within patient's tolerance    Home Living                      Prior Function            PT Goals (current goals can now be found in the care plan section) Acute Rehab PT Goals Patient Stated Goal: none stated PT Goal Formulation: With patient Time For Goal Achievement: 10/15/18 Potential to Achieve Goals: Fair Progress towards PT goals: Progressing toward goals    Frequency    Min 3X/week      PT Plan Current plan remains appropriate    Co-evaluation              AM-PAC PT "6 Clicks" Mobility   Outcome Measure  Help needed turning from your back to your side while in a flat bed without using bedrails?: A Little Help needed moving from lying on your back to sitting on the side of a flat bed without using bedrails?: A Little Help needed moving to and from a bed to a chair (including a wheelchair)?: A Little Help needed standing up from a chair using your arms (e.g., wheelchair or bedside chair)?: A Little Help needed to walk in hospital room?: A Lot Help needed climbing 3-5 steps with a railing? : Total 6 Click Score: 15    End of Session Equipment Utilized During Treatment: Gait belt Activity Tolerance: Patient limited by fatigue Patient left: in chair;with call bell/phone within reach;with chair alarm set Nurse Communication: Mobility status;Other (comment)(PT to meet patient and RN at [facility] for family education ) PT Visit Diagnosis: Unsteadiness on feet (R26.81);Other abnormalities of gait and mobility (R26.89);Muscle weakness (generalized) (M62.81);Difficulty in walking, not elsewhere classified (R26.2)     Time: 4097-3532 PT Time Calculation (min) (ACUTE ONLY): 35 min  Charges:  $Gait Training: 23-37 mins                     Deniece Ree PT, DPT, CBIS  Supplemental Physical Therapist [place]    Pager [phone] Acute Rehab Office [phone]

## 2018-10-04 NOTE — Progress Notes (Signed)
Inpatient Rehab Admissions Coordinator:    Pt with improvement in functional mobility and preferring to go home with home health therapies.  Spoke with pt's husband, Lynnae Sandhoff, and he confirmed that he is able and willing to take pt home.  I will let CM and CSW know.  We will sign off at this time.    Shann Medal, PT, DPT Admissions Coordinator [phone] 10/04/18  10:22 AM

## 2018-10-04 NOTE — Progress Notes (Signed)
Physical Therapy Treatment Patient Details Name: [name] MRN: [id number] DOB: [birth year] Today's Date: 10/04/2018    History of Present Illness Pt is a 63 year old woman admitted to [facility] from [facility] on 3/22 with septic shock, AKI, +cdiff, uropathy 2/2 kidney stones. Pt intubated 3/24 to 3/28. PMH: DM, CHF with EF of 45%, afib.     PT Comments    RN reports that all discharge paperwork/processes have been completed and patient ready for DC. Patient received up in chair, assisted with dressing into personal clothing then was able to transfer to transport chair with MinA and RW, Mod cues for safety. She was transported off of unit to her car [place] in transport chair, then performed sit to stand from transport chair with MinA and RW, however required [place] for safe transfer into high SUV seat. Husband educated on use of gait belt, safe guarding/sequencing for transfers, and educated that until [place] has a chance to work with her and improve strength/balance/mobility/gait/safety, for safety he should only perform transfers with patient at this time. Also educated regarding use of WC as primary use of mobility and use of step stool for transfers in/out of high car as well as use of ramp with WC rather than stairs at home. Patient left safely in SUV passenger seat in care of her husband, all needs otherwise met and all questions/concerns addressed.    Follow Up Recommendations  CIR     Equipment Recommendations  Other (comment);Rolling walker with 5" wheels    Recommendations for Other Services       Precautions / Restrictions Precautions Precautions: Fall Restrictions Weight Bearing Restrictions: No    Mobility  Bed Mobility               General bed mobility comments: OOB in chair   Transfers Overall transfer level: Needs assistance Equipment used: Rolling walker (2 wheeled) Transfers: Sit to/from [facility] Sit to Stand: Min assist Stand pivot  transfers: Mod assist       General transfer comment: continues to require light minA for boost to standing, Mod cues for hand placement and safety/sequencing; required MOdA for stand-pivot transfer to high car seat   Ambulation/Gait Ambulation/Gait assistance: Min guard Gait Distance (Feet): 8 Feet Assistive device: Rolling walker (2 wheeled) Gait Pattern/deviations: Step-through pattern;Decreased step length - right;Decreased step length - left;Decreased dorsiflexion - right;Decreased dorsiflexion - left     General Gait Details: very easily fatigued, only able to tolerate gait x67f today but steady and safe with RW    Stairs             Wheelchair Mobility    Modified Rankin (Stroke Patients Only)       Balance Overall balance assessment: Needs assistance Sitting-balance support: Feet supported;Bilateral upper extremity supported Sitting balance-Leahy Scale: Good Sitting balance - Comments: able to maintain static midline sitting with light min guard to distant S today    Standing balance support: Bilateral upper extremity supported;During functional activity Standing balance-Leahy Scale: Poor Standing balance comment: heavy reliance on B UE support                             Cognition Arousal/Alertness: Awake/alert Behavior During Therapy: Flat affect Overall Cognitive Status: Within Functional Limits for tasks assessed  General Comments: Increased time for processing for all questions and commads, able to laugh at PT jokes and make jokes with PT today       Exercises      General Comments        Pertinent Vitals/Pain Pain Assessment: Faces Pain Score: 0-No pain Faces Pain Scale: No hurt Pain Intervention(s): Monitored during session;Limited activity within patient's tolerance    Home Living                      Prior Function            PT Goals (current goals can now be found  in the care plan section) Acute Rehab PT Goals Patient Stated Goal: none stated PT Goal Formulation: With patient Time For Goal Achievement: 10/15/18 Potential to Achieve Goals: Fair Progress towards PT goals: Progressing toward goals    Frequency    Min 3X/week      PT Plan Current plan remains appropriate    Co-evaluation              AM-PAC PT "6 Clicks" Mobility   Outcome Measure  Help needed turning from your back to your side while in a flat bed without using bedrails?: A Little Help needed moving from lying on your back to sitting on the side of a flat bed without using bedrails?: A Little Help needed moving to and from a bed to a chair (including a wheelchair)?: A Little Help needed standing up from a chair using your arms (e.g., wheelchair or bedside chair)?: A Little Help needed to walk in hospital room?: A Lot Help needed climbing 3-5 steps with a railing? : Total 6 Click Score: 15    End of Session Equipment Utilized During Treatment: Gait belt Activity Tolerance: Patient tolerated treatment well Patient left: Other (comment)(in car with husband preparing to go home ) Nurse Communication: Mobility status;Other (comment)(PT to meet patient and RN at [facility] for family education ) PT Visit Diagnosis: Unsteadiness on feet (R26.81);Other abnormalities of gait and mobility (R26.89);Muscle weakness (generalized) (M62.81);Difficulty in walking, not elsewhere classified (R26.2)     Time: 3729-0211 PT Time Calculation (min) (ACUTE ONLY): 28 min  Charges:   $Therapeutic Activity: 8-22 mins $Self Care/Home Management: 8-22                     Deniece Ree PT, DPT, CBIS  Supplemental Physical Therapist [place]    Pager [phone] Acute Rehab Office [phone]

## 2018-10-15 ENCOUNTER — Telehealth: Payer: Self-pay | Admitting: Emergency Medicine

## 2018-10-15 ENCOUNTER — Encounter: Payer: Self-pay | Admitting: Emergency Medicine

## 2018-10-15 NOTE — Progress Notes (Signed)
Patient reports previous appeal got denied. She is asking we send a letter to her insurance. Will confirm with Dr Rondel Oh.

## 2018-10-15 NOTE — Telephone Encounter (Signed)
Patient is calling regarding needing a letter for appeal of entresto. Will complete letter and have DOD team in [place] fax.

## 2018-10-16 NOTE — Telephone Encounter (Signed)
Called patient back, her prior Josem Kaufmann has been approved. She will need to be scheduled for a televisit. Unable to leave voicemail. Will continue efforts.

## 2018-10-16 NOTE — Telephone Encounter (Signed)
Informed patient was approved for medication. She will verify with pharmacy and call us if she needs any other assistance.

## 2018-10-19 ENCOUNTER — Encounter: Payer: Self-pay | Admitting: Cardiology

## 2018-10-19 ENCOUNTER — Other Ambulatory Visit: Payer: Self-pay

## 2018-10-19 ENCOUNTER — Telehealth (INDEPENDENT_AMBULATORY_CARE_PROVIDER_SITE_OTHER): Payer: Federal, State, Local not specified - PPO | Admitting: Cardiology

## 2018-10-19 VITALS — BP 114/66 | HR 102 | Wt 140.4 lb

## 2018-10-19 DIAGNOSIS — N200 Calculus of kidney: Secondary | ICD-10-CM

## 2018-10-19 DIAGNOSIS — E669 Obesity, unspecified: Secondary | ICD-10-CM

## 2018-10-19 DIAGNOSIS — E1169 Type 2 diabetes mellitus with other specified complication: Secondary | ICD-10-CM

## 2018-10-19 DIAGNOSIS — I480 Paroxysmal atrial fibrillation: Secondary | ICD-10-CM

## 2018-10-19 DIAGNOSIS — I5022 Chronic systolic (congestive) heart failure: Secondary | ICD-10-CM

## 2018-10-19 MED ORDER — SACUBITRIL-VALSARTAN 24-26 MG PO TABS: 1.0000 | ORAL_TABLET | Freq: Two times a day (BID) | ORAL | 3 refills | Status: DC

## 2018-10-19 NOTE — Progress Notes (Signed)
Virtual Visit via Video Note   This visit type was conducted due to national recommendations for restrictions regarding the COVID-19 Pandemic (e.g. social distancing) in an effort to limit this patient's exposure and mitigate transmission in our community.  Due to her co-morbid illnesses, this patient is at least at moderate risk for complications without adequate follow up.  This format is felt to be most appropriate for this patient at this time.  All issues noted in this document were discussed and addressed.  A limited physical exam was performed with this format.  Please refer to the patient's chart for her consent to telehealth for [facility].  Evaluation Performed:  Follow-up visit  This visit type was conducted due to national recommendations for restrictions regarding the COVID-19 Pandemic (e.g. social distancing).  This format is felt to be most appropriate for this patient at this time.  All issues noted in this document were discussed and addressed.  No physical exam was performed (except for noted visual exam findings with Video Visits).  Please refer to the patient's chart (MyChart message for video visits and phone note for telephone visits) for the patient's consent to telehealth for [facility].  Date:  10/19/2018  ID: [name], DOB 1955/10/23, MRN [id number]   Patient Location: [place] [address]   Provider location:   [place] Office  PCP:  Enid Skeens., MD  Cardiologist:  Jenne Campus, MD     Chief Complaint: Doing better  History of Present Illness:    [name] is a 63 y.o. female  who presents via audio/video conferencing for a telehealth visit today.  Past medical history significant for paroxysmal atrial fibrillation, cardiomyopathy with mildly diminished left ventricular ejection fraction.  Recently she ended up going to the hospital because of septic shock.  She got urinary tract infection eventually  required intubation was transferred to [facility] manage appropriately and recovered quite completely.  She is back at home denies having any palpitation she check her heart rate on the regular basis see numbers between 80 and 100.  I did review EKGs while she was in the hospital and there was no evidence of atrial fibrillation.  Overall she is gradually getting better she is walking with a walker but gradually trying to go on the cane.  Denies having palpitation no chest pain tightness squeezing pressure burning chest.   The patient does not have symptoms concerning for COVID-19 infection (fever, chills, cough, or new SHORTNESS OF BREATH).    Prior CV studies:   The following studies were reviewed today:  Echocardiogram done while in the hospital in March 2020 showed:  1. The left ventricle has mild-moderately reduced systolic function, with an ejection fraction of 40-45%. The cavity size was normal. Left ventricular diastolic function could not be evaluated. Left ventrical global hypokinesis without regional wall  motion abnormalities.  2. The right ventricle has normal systolic function. The cavity was normal. There is no increase in right ventricular wall thickness. Right ventricular systolic pressure is normal.  3. The MR jet is centrally-directed.  4. Tricuspid valve regurgitation is mild-moderate.     Past Medical History:  Diagnosis Date   Arthritis    "back, knees, arms, wrists" (05/15/2013)   Asthma    Breast cancer ([place])    CHF (congestive heart failure) ([place])    "mild" (05/15/2013)   Chronic bronchitis (H[facility])    Chronic lower back pain    Dysrhythmia  atrial fib/dr [facility]   Family history of anesthesia complication    "my mother also had PONV" (05/15/2013)   GERD (gastroesophageal reflux disease)    Heart murmur    High cholesterol    Kidney stones    Migraine    "haven't had one in the early 2000's" (05/15/2013)   Pneumonia     "used to be chronic; last time I had it was 2013" (05/15/2013)   PONV (postoperative nausea and vomiting)    Recurrent UTI (urinary tract infection)    "from the continuous kidney stones; take Macrodantin qd" (05/15/2013)   Sepsis ([place]) 05   kidney stone infection   Tension headache    Type II diabetes mellitus ([place])     Past Surgical History:  Procedure Laterality Date   BILATERAL OOPHORECTOMY Bilateral 2006   "cause I needed to 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" (05/15/2013)   BREAST BIOPSY Left 02/2001   BREAST LUMPECTOMY Left 02/2001   BREAST LUMPECTOMY Left 09/23/2013   Procedure: LEFT LUMPECTOMY WITH SPECIMEN MAMMOGRAM;  Surgeon: Adin Hector, MD;  Location: [facility];  Service: General;  Laterality: Left;   COLONOSCOPY     DIAGNOSTIC LAPAROSCOPY     cyst-near ovary   LITHOTRIPSY     "2-3 times prior to 2002" (05/15/2013)   SPINAL FUSION N/A 05/08/2013   Procedure: T9-S1 INSTRUMENTED FUSION T12 -S1 DECOMPRESSION;  Surgeon: Melina Schools, MD;  Location: [facility];  Service: Orthopedics;  Laterality: N/A;   TUBAL LIGATION Bilateral 1985     Current Meds  Medication Sig   acetaminophen (TYLENOL) 500 MG tablet Take 500 mg every 6 (six) hours as needed by mouth.   albuterol (PROVENTIL HFA;VENTOLIN HFA) 108 (90 Base) MCG/ACT inhaler Inhale 2 puffs into the lungs as needed for wheezing or shortness of breath.   ALPRAZolam (XANAX) 0.25 MG tablet Take 0.25 mg by mouth 3 (three) times daily as needed for anxiety.    atorvastatin (LIPITOR) 10 MG tablet Take 10 mg by mouth daily at 6 PM.    carvedilol (COREG) 6.25 MG tablet Take 6.25 mg by mouth 2 (two) times daily with a meal.    ELIQUIS 5 MG TABS tablet TAKE 1 TABLET BY MOUTH TWICE A DAY (Patient taking differently: Take 5 mg by mouth 2 (two) times daily. )   flecainide (TAMBOCOR) 50 MG tablet Take 50 mg by mouth 2 (two) times daily.    fluticasone (FLONASE) 50 MCG/ACT nasal spray  PLACE 1 SPRAY INTO BOTH NOSTRILS 2 (TWO) TIMES DAILY.   glipiZIDE (GLUCOTROL XL) 5 MG 24 hr tablet Take 10 mg by mouth daily with breakfast.    ipratropium (ATROVENT) 0.06 % nasal spray Place 2 sprays into both nostrils 3 (three) times daily.   loratadine (CLARITIN) 10 MG tablet Take 10 mg by mouth daily as needed for allergies.   metFORMIN (GLUCOPHAGE) 1000 MG tablet Take 1,000 mg by mouth 2 (two) times daily with a meal.   montelukast (SINGULAIR) 10 MG tablet TAKE 1 TABLET BY MOUTH EVERYDAY AT BEDTIME (Patient taking differently: Take 10 mg by mouth at bedtime. TAKE 1 TABLET BY MOUTH EVERYDAY AT BEDTIME)   omeprazole (PRILOSEC) 20 MG capsule Take 20 mg by mouth daily.    sacubitril-valsartan (ENTRESTO) 24-26 MG Take 1 tablet by mouth 2 (two) times daily.   sertraline (ZOLOFT) 50 MG tablet Take 50 mg by mouth daily as needed (anxiety).    sitaGLIPtin (JANUVIA) 100 MG tablet  Take 100 mg by mouth daily.      Family History: The patient's family history includes Breast cancer in her mother; COPD in her mother; Environmental Allergies in her son and another family member; Heart disease in her mother; Kidney cancer in her father; Liver cancer in her brother; Lupus in an other family member.   ROS:   Please see the history of present illness.     All other systems reviewed and are negative.   Labs/Other Tests and Data Reviewed:     Recent Labs: 10/01/2018: B Natriuretic Peptide 2,948.4 10/03/2018: ALT 131 10/04/2018: BUN 36; Creatinine, Ser 1.09; Hemoglobin 8.4; Magnesium 1.6; Platelets 103; Potassium 4.0; Sodium 142  Recent Lipid Panel    Component Value Date/Time   TRIG 155 (H) 05/08/2013 2023      Exam:    Vital Signs:  BP 114/66    Pulse (!) 102    Wt 140 lb 6.4 oz (63.7 kg)    BMI 28.36 kg/m     Wt Readings from Last 3 Encounters:  10/19/18 140 lb 6.4 oz (63.7 kg)  10/04/18 161 lb 9.6 oz (73.3 kg)  08/20/18 155 lb (70.3 kg)     Well nourished, well developed in no  acute distress. She is talking to me the video link sitting in her living room.  Alert awake oriented x3.  No swelling of lower extremities, no JVD.  Diagnosis for this visit:   1. PAF (paroxysmal atrial fibrillation) ([place])   2. Chronic systolic congestive heart failure ([place])   3. Diabetes mellitus type 2 in obese ([place])   4. Nephrolithiasis      ASSESSMENT & PLAN:    1.  At which I will continue.  She is also on flecainide. 2.  Chronic systolic congestive heart failure with latest ejection fraction 4045% while she was in the hospital with sepsis.  Continue present medications which include Entresto.  She is scheduled to see her primary care physician on Wednesday who will do her kidney function test. 3.  Diabetes stable from that point review continue present management. 4.  Nephrolithiasis status post recent urological intervention.  COVID-19 Education: The signs and symptoms of COVID-19 were discussed with the patient and how to seek care for testing (follow up with PCP or arrange E-visit).  The importance of social distancing was discussed today.  Patient Risk:   After full review of this patients clinical status, I feel that they are at least moderate risk at this time.  Time:   Today, I have spent 18 minutes with the patient with telehealth technology discussing pt health issues.  I spent 5 minutes reviewing her chart before the visit.  Visit was finished at 4:12 PM.    Medication Adjustments/Labs and Tests Ordered: Current medicines are reviewed at length with the patient today.  Concerns regarding medicines are outlined above.  No orders of the defined types were placed in this encounter.  Medication changes: No orders of the defined types were placed in this encounter.    Disposition: Follow-up 3 months  Signed, Park Liter, MD, [facility] 10/19/2018 4:11 PM    [place]

## 2018-10-19 NOTE — Patient Instructions (Signed)
Medication Instructions:  Your physician recommends that you continue on your current medications as directed. Please refer to the Current Medication list given to you today.  If you need a refill on your cardiac medications before your next appointment, please call your pharmacy.   Lab work: None.  If you have labs (blood work) drawn today and your tests are completely normal, you will receive your results only by: . MyChart Message (if you have MyChart) OR . A paper copy in the mail If you have any lab test that is abnormal or we need to change your treatment, we will call you to review the results.  Testing/Procedures: None.   Follow-Up: At [facility], you and your health needs are our priority.  As part of our continuing mission to provide you with exceptional heart care, we have created designated Provider Care Teams.  These Care Teams include your primary Cardiologist (physician) and Advanced Practice Providers (APPs -  Physician Assistants and Nurse Practitioners) who all work together to provide you with the care you need, when you need it. You will need a follow up appointment in 3 months.  Please call our office 2 months in advance to schedule this appointment.  You may see No primary care provider on file. or another member of our [facility] Provider Team in [place]: Brian Munley, MD . Rajan Revankar, MD  Any Other Special Instructions Will Be Listed Below (If Applicable).     

## 2018-11-27 ENCOUNTER — Other Ambulatory Visit: Payer: Self-pay | Admitting: Internal Medicine

## 2018-12-19 ENCOUNTER — Inpatient Hospital Stay (HOSPITAL_BASED_OUTPATIENT_CLINIC_OR_DEPARTMENT_OTHER): Payer: Federal, State, Local not specified - PPO | Admitting: Hematology & Oncology

## 2018-12-19 ENCOUNTER — Inpatient Hospital Stay: Payer: Federal, State, Local not specified - PPO | Attending: Hematology & Oncology

## 2018-12-19 ENCOUNTER — Encounter: Payer: Self-pay | Admitting: Hematology & Oncology

## 2018-12-19 ENCOUNTER — Other Ambulatory Visit: Payer: Self-pay

## 2018-12-19 ENCOUNTER — Telehealth: Payer: Self-pay | Admitting: Hematology & Oncology

## 2018-12-19 VITALS — BP 117/58 | HR 92 | Temp 98.6°F | Wt 143.8 lb

## 2018-12-19 DIAGNOSIS — Z853 Personal history of malignant neoplasm of breast: Secondary | ICD-10-CM | POA: Insufficient documentation

## 2018-12-19 DIAGNOSIS — N183 Chronic kidney disease, stage 3 unspecified: Secondary | ICD-10-CM

## 2018-12-19 DIAGNOSIS — D631 Anemia in chronic kidney disease: Secondary | ICD-10-CM

## 2018-12-19 DIAGNOSIS — D509 Iron deficiency anemia, unspecified: Secondary | ICD-10-CM | POA: Diagnosis not present

## 2018-12-19 DIAGNOSIS — D500 Iron deficiency anemia secondary to blood loss (chronic): Secondary | ICD-10-CM

## 2018-12-19 DIAGNOSIS — C50011 Malignant neoplasm of nipple and areola, right female breast: Secondary | ICD-10-CM

## 2018-12-19 LAB — CBC WITH DIFFERENTIAL (CANCER CENTER ONLY)
Abs Immature Granulocytes: 0.06 10*3/uL (ref 0.00–0.07)
Basophils Absolute: 0.1 10*3/uL (ref 0.0–0.1)
Basophils Relative: 1 %
Eosinophils Absolute: 0.2 10*3/uL (ref 0.0–0.5)
Eosinophils Relative: 2 %
HCT: 30.8 % — ABNORMAL LOW (ref 36.0–46.0)
Hemoglobin: 9.8 g/dL — ABNORMAL LOW (ref 12.0–15.0)
Immature Granulocytes: 1 %
Lymphocytes Relative: 20 %
Lymphs Abs: 2.1 10*3/uL (ref 0.7–4.0)
MCH: 32 pg (ref 26.0–34.0)
MCHC: 31.8 g/dL (ref 30.0–36.0)
MCV: 100.7 fL — ABNORMAL HIGH (ref 80.0–100.0)
Monocytes Absolute: 0.5 10*3/uL (ref 0.1–1.0)
Monocytes Relative: 5 %
Neutro Abs: 7.2 10*3/uL (ref 1.7–7.7)
Neutrophils Relative %: 71 %
Platelet Count: 278 10*3/uL (ref 150–400)
RBC: 3.06 MIL/uL — ABNORMAL LOW (ref 3.87–5.11)
RDW: 14.6 % (ref 11.5–15.5)
WBC Count: 10.1 10*3/uL (ref 4.0–10.5)
nRBC: 0 % (ref 0.0–0.2)

## 2018-12-19 LAB — FERRITIN: Ferritin: 124 ng/mL (ref 11–307)

## 2018-12-19 LAB — RETICULOCYTES
Immature Retic Fract: 20.6 % — ABNORMAL HIGH (ref 2.3–15.9)
RBC.: 3.05 MIL/uL — ABNORMAL LOW (ref 3.87–5.11)
Retic Count, Absolute: 79.6 10*3/uL (ref 19.0–186.0)
Retic Ct Pct: 2.6 % (ref 0.4–3.1)

## 2018-12-19 LAB — IRON AND TIBC
Iron: 50 ug/dL (ref 41–142)
Saturation Ratios: 18 % — ABNORMAL LOW (ref 21–57)
TIBC: 275 ug/dL (ref 236–444)
UIBC: 225 ug/dL (ref 120–384)

## 2018-12-19 NOTE — Telephone Encounter (Signed)
Appointments scheduled and I spoke with patient regarding date/time per 6/17 los

## 2018-12-19 NOTE — Progress Notes (Signed)
Hematology and Oncology Follow Up Visit  [name] [id number] 04/19/1956 63 y.o. 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  Surprisingly enough, since we last saw her, she had a near death experience.  She was in the ICU for several days.  She got septic from a kidney stone.  I am sure she probably was bacteremic.  She was in the hospital for couple weeks.  She feels better.  She has had no complaints.  She did have some physical therapy when she did go home.  She has had no bleeding.  There is been no issues with bowels or bladder.  She has had no nausea or vomiting.  Has had no rashes.  We have given her iron before.  Other she got iron back in February.  At that time, her ferritin was only 41 with iron saturation of 11%.  As far as her breast cancer is concerned, this really is not an issue.  She is now about 17 years out from her diagnosis.  She is still taking Eliquis.  She is doing okay on the Eliquis.  Currently, her performance status is ECOG 1.  Medications:  Allergies as of 12/19/2018      Reactions   Ativan [lorazepam]    Other Nausea And Vomiting, Swelling   Shrimp if eat a lot   Cleocin [clindamycin Hcl] Other (See Comments)   "irritated my esophagus"   Morphine And Related Nausea And Vomiting   Pneumococcal Vaccines Other (See Comments)   Really high fever, and flu symptoms. MD instructed not to give   Buprenorphine Hcl Nausea And Vomiting      Medication List       Accurate as of December 19, 2018 10:50 AM. If you have any questions, ask your nurse or doctor.        acetaminophen 500 MG tablet Commonly known as: TYLENOL Take 500 mg every 6 (six) hours as needed by mouth.   albuterol 108 (90 Base)  MCG/ACT inhaler Commonly known as: VENTOLIN HFA Inhale 2 puffs into the lungs as needed for wheezing or shortness of breath.   ALPRAZolam 0.25 MG tablet Commonly known as: XANAX Take 0.25 mg by mouth 3 (three) times daily as needed for anxiety.   atorvastatin 10 MG tablet Commonly known as: LIPITOR Take 10 mg by mouth daily at 6 PM.   carvedilol 6.25 MG tablet Commonly known as: COREG Take 6.25 mg by mouth 2 (two) times daily with a meal.   Eliquis 5 MG Tabs tablet Generic drug: apixaban TAKE 1 TABLET BY MOUTH TWICE A DAY What changed: how much to take   flecainide 50 MG tablet Commonly known as: TAMBOCOR Take 50 mg by mouth 2 (two) times daily.   fluticasone 50 MCG/ACT nasal spray Commonly known as: FLONASE PLACE 1 SPRAY INTO BOTH NOSTRILS 2 (TWO) TIMES DAILY.   glipiZIDE 5 MG 24 hr tablet Commonly known as: GLUCOTROL XL Take 10 mg by mouth daily with breakfast.   ipratropium 0.06 % nasal spray Commonly known as: ATROVENT Place 2 sprays into both nostrils 3 (three) times daily.   loratadine 10 MG tablet Commonly known as: CLARITIN Take 10 mg by mouth daily as needed for allergies.   metFORMIN 1000 MG tablet Commonly  known as: GLUCOPHAGE Take 1,000 mg by mouth 2 (two) times daily with a meal.   montelukast 10 MG tablet Commonly known as: SINGULAIR TAKE 1 TABLET BY MOUTH EVERYDAY AT BEDTIME   omeprazole 20 MG capsule Commonly known as: PRILOSEC Take 20 mg by mouth daily.   sacubitril-valsartan 24-26 MG Commonly known as: Entresto Take 1 tablet by mouth 2 (two) times daily.   sertraline 50 MG tablet Commonly known as: ZOLOFT Take 50 mg by mouth daily as needed (anxiety).   sitaGLIPtin 100 MG tablet Commonly known as: JANUVIA Take 100 mg by mouth daily.   sulfamethoxazole-trimethoprim 800-160 MG tablet Commonly known as: BACTRIM DS TAKE 1 TABLET BY MOUTH TWICE A DAY FOR 7 DAYS,THEN 1 TABLET A DAY       Allergies:  Allergies  Allergen Reactions   . Ativan [Lorazepam]   . Other Nausea And Vomiting and Swelling    Shrimp if eat a lot  . Cleocin [Clindamycin Hcl] Other (See Comments)    "irritated my esophagus"  . Morphine And Related Nausea And Vomiting  . Pneumococcal Vaccines Other (See Comments)    Really high fever, and flu symptoms. MD instructed not to give  . Buprenorphine Hcl Nausea And Vomiting    Past Medical History, Surgical history, Social history, and Family History were reviewed and updated.  Review of Systems: Review of Systems  Constitutional: Negative.   HENT: Negative.   Eyes: Negative.   Respiratory: Negative.   Cardiovascular: Negative.   Gastrointestinal: Negative.   Genitourinary: Negative.   Musculoskeletal: Negative.   Skin: Negative.   Neurological: Negative.   Endo/Heme/Allergies: Negative.   Psychiatric/Behavioral: Negative.      Physical Exam:  weight is 143 lb 12.8 oz (65.2 kg). Her temperature is 98.6 F (37 C). Her blood pressure is 117/58 (abnormal) and her pulse is 92. Her oxygen saturation is 100%.   Wt Readings from Last 3 Encounters:  12/19/18 143 lb 12.8 oz (65.2 kg)  10/19/18 140 lb 6.4 oz (63.7 kg)  10/04/18 161 lb 9.6 oz (73.3 kg)   Physical Exam Vitals signs reviewed.  Constitutional:      Comments: Breast exam shows right breast with no masses, edema or erythema.  There is no right axillary adenopathy.  Left breast is contracted from surgery and radiation therapy.  He has a lumpectomy scar at the areola.  There is some slight firmness at the lumpectomy site.  No distinct masses noted.  There is no left axillary adenopathy.  HENT:     Head: Normocephalic and atraumatic.  Eyes:     Pupils: Pupils are equal, round, and reactive to light.  Neck:     Musculoskeletal: Normal range of motion.  Cardiovascular:     Rate and Rhythm: Normal rate and regular rhythm.     Heart sounds: Normal heart sounds.  Pulmonary:     Effort: Pulmonary effort is normal.     Breath sounds:  Normal breath sounds.  Abdominal:     General: Bowel sounds are normal.     Palpations: Abdomen is soft.  Musculoskeletal: Normal range of motion.        General: No tenderness or deformity.  Lymphadenopathy:     Cervical: No cervical adenopathy.  Skin:    General: Skin is warm and dry.     Findings: No erythema or rash.  Neurological:     Mental Status: She is alert and oriented to person, place, and time.  Psychiatric:  Behavior: Behavior normal.        Thought Content: Thought content normal.        Judgment: Judgment normal.       Lab Results  Component Value Date   WBC 10.1 12/19/2018   HGB 9.8 (L) 12/19/2018   HCT 30.8 (L) 12/19/2018   MCV 100.7 (H) 12/19/2018   PLT 278 12/19/2018   Lab Results  Component Value Date   FERRITIN 41 08/20/2018   IRON 42 08/20/2018   TIBC 374 08/20/2018   UIBC 332 08/20/2018   IRONPCTSAT 11 (L) 08/20/2018   Lab Results  Component Value Date   RETICCTPCT 2.6 12/19/2018   RBC 3.05 (L) 12/19/2018   RBC 3.06 (L) 12/19/2018   No results found for: Nils Pyle [facility] Lab Results  Component Value Date   IGGSERUM 789 11/15/2016   IGA 150 11/15/2016   IGMSERUM 86 11/15/2016   No results found for: Odetta Pink, SPEI   Chemistry      Component Value Date/Time   NA 142 10/04/2018 0304   NA 140 08/31/2018 1052   NA 144 04/17/2017 1150   NA 141 04/18/2016 1256   K 4.0 10/04/2018 0304   K 4.6 04/17/2017 1150   K 4.4 04/18/2016 1256   CL 104 10/04/2018 0304   CL 109 (H) 04/17/2017 1150   CO2 29 10/04/2018 0304   CO2 23 04/17/2017 1150   CO2 18 (L) 04/18/2016 1256   BUN 36 (H) 10/04/2018 0304   BUN 19 08/31/2018 1052   BUN 28 (H) 04/17/2017 1150   BUN 27.3 (H) 04/18/2016 1256   CREATININE 1.09 (H) 10/04/2018 0304   CREATININE 1.3 (H) 04/17/2017 1150   CREATININE 1.4 (H) 04/18/2016 1256      Component Value Date/Time   CALCIUM 8.5 (L) 10/04/2018  0304   CALCIUM 9.6 04/17/2017 1150   CALCIUM 9.4 04/18/2016 1256   ALKPHOS 283 (H) 10/03/2018 0405   ALKPHOS 62 04/17/2017 1150   ALKPHOS 68 04/18/2016 1256   AST 46 (H) 10/03/2018 0405   AST 24 04/17/2017 1150   AST 14 04/18/2016 1256   ALT 131 (H) 10/03/2018 0405   ALT 20 04/17/2017 1150   ALT 23 04/18/2016 1256   BILITOT 1.6 (H) 10/03/2018 0405   BILITOT 0.40 04/17/2017 1150   BILITOT <0.22 04/18/2016 1256       Impression and Plan: Ms. Stare is a very pleasant 63 yo caucasian female with history of stage I ductal carcinoma of the left breast .  She is over 17 years out now.  I am just amazed that she had this sepsis.  I am glad that she looks as good as she does right now.  Her hemoglobin is still on the low side.  It is getting better.  I suspect that she actually may need ESA.  Her erythropoietin level is only 10.4.  I would like to see her back in another 3 months.  I think we have to maintain close contact with her given this sepsis and with her anemia and with her low erythropoietin level.    [name], MD 6/17/202010:50 AM

## 2018-12-20 ENCOUNTER — Telehealth: Payer: Self-pay | Admitting: Hematology & Oncology

## 2018-12-20 LAB — ERYTHROPOIETIN: Erythropoietin: 14.1 m[IU]/mL (ref 2.6–18.5)

## 2018-12-20 NOTE — Telephone Encounter (Signed)
Called swp regarding appointments per 6/17 result note

## 2018-12-24 ENCOUNTER — Ambulatory Visit: Payer: Federal, State, Local not specified - PPO

## 2018-12-27 ENCOUNTER — Other Ambulatory Visit: Payer: Self-pay

## 2018-12-27 ENCOUNTER — Inpatient Hospital Stay: Payer: Federal, State, Local not specified - PPO

## 2018-12-27 VITALS — BP 114/62 | HR 84 | Temp 98.0°F | Resp 18

## 2018-12-27 DIAGNOSIS — D508 Other iron deficiency anemias: Secondary | ICD-10-CM

## 2018-12-27 DIAGNOSIS — Z853 Personal history of malignant neoplasm of breast: Secondary | ICD-10-CM | POA: Diagnosis not present

## 2018-12-27 MED ORDER — SODIUM CHLORIDE 0.9 % IV SOLN
510.0000 mg | Freq: Once | INTRAVENOUS | Status: AC
  Administered 2018-12-27: 510 mg via INTRAVENOUS
  Filled 2018-12-27: qty 17

## 2018-12-27 MED ORDER — SODIUM CHLORIDE 0.9 % IV SOLN
INTRAVENOUS | Status: DC
  Administered 2018-12-27: 09:00:00 via INTRAVENOUS
  Filled 2018-12-27: qty 250

## 2018-12-27 NOTE — Patient Instructions (Signed)

## 2019-01-09 ENCOUNTER — Other Ambulatory Visit: Payer: Self-pay | Admitting: Hematology & Oncology

## 2019-01-09 DIAGNOSIS — Z1231 Encounter for screening mammogram for malignant neoplasm of breast: Secondary | ICD-10-CM

## 2019-01-23 ENCOUNTER — Other Ambulatory Visit: Payer: Self-pay

## 2019-01-23 ENCOUNTER — Encounter: Payer: Self-pay | Admitting: Cardiology

## 2019-01-23 ENCOUNTER — Telehealth (INDEPENDENT_AMBULATORY_CARE_PROVIDER_SITE_OTHER): Payer: Federal, State, Local not specified - PPO | Admitting: Cardiology

## 2019-01-23 VITALS — BP 114/78 | Wt 143.0 lb

## 2019-01-23 DIAGNOSIS — R0609 Other forms of dyspnea: Secondary | ICD-10-CM

## 2019-01-23 DIAGNOSIS — I480 Paroxysmal atrial fibrillation: Secondary | ICD-10-CM

## 2019-01-23 DIAGNOSIS — I10 Essential (primary) hypertension: Secondary | ICD-10-CM

## 2019-01-23 NOTE — Patient Instructions (Signed)
Medication Instructions:  Your physician recommends that you continue on your current medications as directed. Please refer to the Current Medication list given to you today.  If you need a refill on your cardiac medications before your next appointment, please call your pharmacy.   Lab work: None ordered If you have labs (blood work) drawn today and your tests are completely normal, you will receive your results only by: Marland Kitchen MyChart Message (if you have MyChart) OR . A paper copy in the mail If you have any lab test that is abnormal or we need to change your treatment, we will call you to review the results.  Testing/Procedures: None ordered  Follow-Up: At [facility], you and your health needs are our priority.  As part of our continuing mission to provide you with exceptional heart care, we have created designated Provider Care Teams.  These Care Teams include your primary Cardiologist (physician) and Advanced Practice Providers (APPs -  Physician Assistants and Nurse Practitioners) who all work together to provide you with the care you need, when you need it. You will need a follow up appointment in 5 months.   You may see Jenne Campus or another member of our [employer] Provider Team in [place]: Shirlee More, MD . Jyl Heinz, MD

## 2019-01-23 NOTE — Progress Notes (Signed)
Virtual Visit via Video Note   This visit type was conducted due to national recommendations for restrictions regarding the COVID-19 Pandemic (e.g. social distancing) in an effort to limit this patient's exposure and mitigate transmission in our community.  Due to her co-morbid illnesses, this patient is at least at moderate risk for complications without adequate follow up.  This format is felt to be most appropriate for this patient at this time.  All issues noted in this document were discussed and addressed.  A limited physical exam was performed with this format.  Please refer to the patient's chart for her consent to telehealth for [facility].  Evaluation Performed:  Follow-up visit  This visit type was conducted due to national recommendations for restrictions regarding the COVID-19 Pandemic (e.g. social distancing).  This format is felt to be most appropriate for this patient at this time.  All issues noted in this document were discussed and addressed.  No physical exam was performed (except for noted visual exam findings with Video Visits).  Please refer to the patient's chart (MyChart message for video visits and phone note for telephone visits) for the patient's consent to telehealth for [facility].  Date:  01/23/2019  ID: [name], DOB [birth year], MRN [id number]   Patient Location: [place] [address]   Provider location:   [place] Office  PCP:  Enid Skeens., MD  Cardiologist:  Jenne Campus, MD     Chief Complaint: Doing well  History of Present Illness:    [name] is a 63 y.o. female  who presents via audio/video conferencing for a telehealth visit today.  With paroxysmal atrial fibrillation, chads 2 Vascor equals 3.  Anticoagulated, suppressed successfully with flecainide.  Denies having any frequent palpitations very rare episode that her heart will speed up but for very short period of time she is upset about  coronavirus situation she is upset about the fact that she is staying at home but overall seems to be doing well cardiac wise   The patient does not have symptoms concerning for COVID-19 infection (fever, chills, cough, or new SHORTNESS OF BREATH).    Prior CV studies:   The following studies were reviewed today:       Past Medical History:  Diagnosis Date  . Arthritis    "back, knees, arms, wrists" (05/15/2013)  . Asthma   . Breast cancer ([place])   . CHF (congestive heart failure) ([place])    "mild" (05/15/2013)  . Chronic bronchitis ([place])   . Chronic lower back pain   . Dysrhythmia    atrial fib/dr [facility]  . Family history of anesthesia complication    "my mother also had PONV" (05/15/2013)  . GERD (gastroesophageal reflux disease)   . Heart murmur   . High cholesterol   . Kidney stones   . Migraine    "haven't had one in the early 2000's" (05/15/2013)  . Pneumonia    "used to be chronic; last time I had it was 2013" (05/15/2013)  . PONV (postoperative nausea and vomiting)   . Recurrent UTI (urinary tract infection)    "from the continuous kidney stones; take Macrodantin qd" (05/15/2013)  . Sepsis ([place]) 05   kidney stone infection  . Tension headache   . Type II diabetes mellitus ([place])     Past Surgical History:  Procedure Laterality Date  . BILATERAL OOPHORECTOMY Bilateral 2006   "cause I needed to get rid of  the estrogen due to estrogen fed cancer" (05/15/2013)  . BREAST BIOPSY Left 02/2001  . BREAST LUMPECTOMY Left 02/2001  . BREAST LUMPECTOMY Left 09/23/2013   Procedure: LEFT LUMPECTOMY WITH SPECIMEN MAMMOGRAM;  Surgeon: Adin Hector, MD;  Location: [place] [facility];  Service: General;  Laterality: Left;  . COLONOSCOPY    . DIAGNOSTIC LAPAROSCOPY     cyst-near ovary  . LITHOTRIPSY     "2-3 times prior to 2002" (05/15/2013)  . SPINAL FUSION N/A 05/08/2013   Procedure: T9-S1 INSTRUMENTED FUSION T12 -S1 DECOMPRESSION;  Surgeon:  Melina Schools, MD;  Location: [place];  Service: Orthopedics;  Laterality: N/A;  . TUBAL LIGATION Bilateral 1985     Current Meds  Medication Sig  . acetaminophen (TYLENOL) 500 MG tablet Take 500 mg every 6 (six) hours as needed by mouth.  Marland Kitchen albuterol (PROVENTIL HFA;VENTOLIN HFA) 108 (90 Base) MCG/ACT inhaler Inhale 2 puffs into the lungs as needed for wheezing or shortness of breath.  . ALPRAZolam (XANAX) 0.25 MG tablet Take 0.25 mg by mouth 3 (three) times daily as needed for anxiety.   Marland Kitchen atorvastatin (LIPITOR) 10 MG tablet Take 10 mg by mouth daily at 6 PM.   . carvedilol (COREG) 6.25 MG tablet Take 6.25 mg by mouth 2 (two) times daily with a meal.   . ELIQUIS 5 MG TABS tablet TAKE 1 TABLET BY MOUTH TWICE A DAY (Patient taking differently: Take 5 mg by mouth 2 (two) times daily. )  . flecainide (TAMBOCOR) 50 MG tablet Take 50 mg by mouth 2 (two) times daily.   . fluticasone (FLONASE) 50 MCG/ACT nasal spray PLACE 1 SPRAY INTO BOTH NOSTRILS 2 (TWO) TIMES DAILY.  Marland Kitchen glipiZIDE (GLUCOTROL XL) 5 MG 24 hr tablet Take 10 mg by mouth daily with breakfast.   . ipratropium (ATROVENT) 0.06 % nasal spray Place 2 sprays into both nostrils 3 (three) times daily.  Marland Kitchen loratadine (CLARITIN) 10 MG tablet Take 10 mg by mouth daily as needed for allergies.  . metFORMIN (GLUCOPHAGE) 1000 MG tablet Take 1,000 mg by mouth 2 (two) times daily with a meal.  . montelukast (SINGULAIR) 10 MG tablet TAKE 1 TABLET BY MOUTH EVERYDAY AT BEDTIME  . omeprazole (PRILOSEC) 20 MG capsule Take 20 mg by mouth daily.   . sacubitril-valsartan (ENTRESTO) 24-26 MG Take 1 tablet by mouth 2 (two) times daily.  . sertraline (ZOLOFT) 50 MG tablet Take 50 mg by mouth daily as needed (anxiety).   . sitaGLIPtin (JANUVIA) 100 MG tablet Take 100 mg by mouth daily.  Marland Kitchen sulfamethoxazole-trimethoprim (BACTRIM DS) 800-160 MG tablet TAKE 1 TABLET BY MOUTH TWICE A DAY FOR 7 DAYS,THEN 1 TABLET A DAY      Family History: The patient's family history  includes Breast cancer in her mother; COPD in her mother; Environmental Allergies in her son and another family member; Heart disease in her mother; Kidney cancer in her father; Liver cancer in her brother; Lupus in an other family member.   ROS:   Please see the history of present illness.     All other systems reviewed and are negative.   Labs/Other Tests and Data Reviewed:     Recent Labs: 10/01/2018: B Natriuretic Peptide 2,948.4 10/03/2018: ALT 131 10/04/2018: BUN 36; Creatinine, Ser 1.09; Magnesium 1.6; Potassium 4.0; Sodium 142 12/19/2018: Hemoglobin 9.8; Platelet Count 278  Recent Lipid Panel    Component Value Date/Time   TRIG 155 (H) 05/08/2013 2023      Exam:  Vital Signs:  BP 114/78   Wt 143 lb (64.9 kg)   BMI 28.88 kg/m     Wt Readings from Last 3 Encounters:  01/23/19 143 lb (64.9 kg)  12/19/18 143 lb 12.8 oz (65.2 kg)  10/19/18 140 lb 6.4 oz (63.7 kg)     Well nourished, well developed in no acute distress. Alert awake in exam 3 8 we took over the video link she is at home I am in the office in [place].  Asymptomatic without any distress at the time of my interview  Diagnosis for this visit:   1. Paroxysmal atrial fibrillation (HCC)   2. Essential hypertension   3. DOE (dyspnea on exertion)      ASSESSMENT & PLAN:    1.  Paroxysmal atrial fibrillation rare episodes I recommended to get cardiac so she can record EKG if she will have any significant arrhythmia. 2.  Essential hypertension blood pressure appears to be well controlled continue present management 3.  Dyspnea on exertion denies having any  COVID-19 Education: The signs and symptoms of COVID-19 were discussed with the patient and how to seek care for testing (follow up with PCP or arrange E-visit).  The importance of social distancing was discussed today.  Patient Risk:   After full review of this patients clinical status, I feel that they are at least moderate risk at this time.   Time:   Today, I have spent 15 minutes with the patient with telehealth technology discussing pt health issues.  I spent 5 minutes reviewing her chart before the visit.  Visit was finished at 4:48 PM.    Medication Adjustments/Labs and Tests Ordered: Current medicines are reviewed at length with the patient today.  Concerns regarding medicines are outlined above.  No orders of the defined types were placed in this encounter.  Medication changes: No orders of the defined types were placed in this encounter.    Disposition: Follow-up in 5 months  Signed, Park Liter, MD, [facility] 01/23/2019 4:47 PM    [place]

## 2019-01-24 ENCOUNTER — Telehealth: Payer: Federal, State, Local not specified - PPO | Admitting: Cardiology

## 2019-02-21 ENCOUNTER — Other Ambulatory Visit: Payer: Self-pay

## 2019-02-21 ENCOUNTER — Ambulatory Visit
Admission: RE | Admit: 2019-02-21 | Discharge: 2019-02-21 | Disposition: A | Discharge status: Home / Self Care | Payer: Federal, State, Local not specified - PPO | Source: Ambulatory Visit | Attending: Hematology & Oncology | Admitting: Hematology & Oncology

## 2019-02-21 DIAGNOSIS — Z1231 Encounter for screening mammogram for malignant neoplasm of breast: Secondary | ICD-10-CM

## 2019-02-21 HISTORY — DX: Personal history of irradiation: Z92.3

## 2019-02-21 HISTORY — DX: Personal history of antineoplastic chemotherapy: Z92.21

## 2019-03-01 ENCOUNTER — Other Ambulatory Visit: Payer: Self-pay | Admitting: Internal Medicine

## 2019-03-14 ENCOUNTER — Other Ambulatory Visit: Payer: Self-pay

## 2019-03-14 MED ORDER — ELIQUIS 5 MG PO TABS: 5.0000 mg | ORAL_TABLET | Freq: Two times a day (BID) | ORAL | 5 refills | Status: DC

## 2019-03-22 ENCOUNTER — Encounter: Payer: Self-pay | Admitting: Hematology & Oncology

## 2019-03-22 ENCOUNTER — Inpatient Hospital Stay (HOSPITAL_BASED_OUTPATIENT_CLINIC_OR_DEPARTMENT_OTHER): Payer: Federal, State, Local not specified - PPO | Admitting: Hematology & Oncology

## 2019-03-22 ENCOUNTER — Inpatient Hospital Stay: Payer: Federal, State, Local not specified - PPO | Attending: Hematology & Oncology

## 2019-03-22 ENCOUNTER — Other Ambulatory Visit: Payer: Self-pay

## 2019-03-22 VITALS — BP 116/64 | HR 79 | Temp 97.5°F | Resp 16 | Wt 151.0 lb

## 2019-03-22 DIAGNOSIS — N189 Chronic kidney disease, unspecified: Secondary | ICD-10-CM | POA: Insufficient documentation

## 2019-03-22 DIAGNOSIS — C50011 Malignant neoplasm of nipple and areola, right female breast: Secondary | ICD-10-CM

## 2019-03-22 DIAGNOSIS — N186 End stage renal disease: Secondary | ICD-10-CM

## 2019-03-22 DIAGNOSIS — D649 Anemia, unspecified: Secondary | ICD-10-CM | POA: Diagnosis present

## 2019-03-22 DIAGNOSIS — D631 Anemia in chronic kidney disease: Secondary | ICD-10-CM | POA: Diagnosis not present

## 2019-03-22 DIAGNOSIS — Z853 Personal history of malignant neoplasm of breast: Secondary | ICD-10-CM | POA: Insufficient documentation

## 2019-03-22 DIAGNOSIS — D500 Iron deficiency anemia secondary to blood loss (chronic): Secondary | ICD-10-CM

## 2019-03-22 LAB — CBC WITH DIFFERENTIAL (CANCER CENTER ONLY)
Abs Immature Granulocytes: 0.06 10*3/uL (ref 0.00–0.07)
Basophils Absolute: 0 10*3/uL (ref 0.0–0.1)
Basophils Relative: 1 %
Eosinophils Absolute: 0.2 10*3/uL (ref 0.0–0.5)
Eosinophils Relative: 3 %
HCT: 31.5 % — ABNORMAL LOW (ref 36.0–46.0)
Hemoglobin: 9.8 g/dL — ABNORMAL LOW (ref 12.0–15.0)
Immature Granulocytes: 1 %
Lymphocytes Relative: 29 %
Lymphs Abs: 1.9 10*3/uL (ref 0.7–4.0)
MCH: 33.3 pg (ref 26.0–34.0)
MCHC: 31.1 g/dL (ref 30.0–36.0)
MCV: 107.1 fL — ABNORMAL HIGH (ref 80.0–100.0)
Monocytes Absolute: 0.5 10*3/uL (ref 0.1–1.0)
Monocytes Relative: 7 %
Neutro Abs: 3.8 10*3/uL (ref 1.7–7.7)
Neutrophils Relative %: 59 %
Platelet Count: 249 10*3/uL (ref 150–400)
RBC: 2.94 MIL/uL — ABNORMAL LOW (ref 3.87–5.11)
RDW: 12.9 % (ref 11.5–15.5)
WBC Count: 6.4 10*3/uL (ref 4.0–10.5)
nRBC: 0 % (ref 0.0–0.2)

## 2019-03-22 LAB — CMP (CANCER CENTER ONLY)
ALT: 13 U/L (ref 0–44)
AST: 16 U/L (ref 15–41)
Albumin: 4.3 g/dL (ref 3.5–5.0)
Alkaline Phosphatase: 50 U/L (ref 38–126)
Anion gap: 8 (ref 5–15)
BUN: 35 mg/dL — ABNORMAL HIGH (ref 8–23)
CO2: 22 mmol/L (ref 22–32)
Calcium: 9.9 mg/dL (ref 8.9–10.3)
Chloride: 109 mmol/L (ref 98–111)
Creatinine: 1.6 mg/dL — ABNORMAL HIGH (ref 0.44–1.00)
GFR, Est AFR Am: 39 mL/min — ABNORMAL LOW (ref >60)
GFR, Estimated: 34 mL/min — ABNORMAL LOW (ref >60)
Glucose, Bld: 121 mg/dL — ABNORMAL HIGH (ref 70–99)
Potassium: 5.4 mmol/L — ABNORMAL HIGH (ref 3.5–5.1)
Sodium: 139 mmol/L (ref 135–145)
Total Bilirubin: 0.2 mg/dL — ABNORMAL LOW (ref 0.3–1.2)
Total Protein: 7 g/dL (ref 6.5–8.1)

## 2019-03-22 LAB — IRON AND TIBC
Iron: 76 ug/dL (ref 41–142)
Saturation Ratios: 29 % (ref 21–57)
TIBC: 263 ug/dL (ref 236–444)
UIBC: 186 ug/dL (ref 120–384)

## 2019-03-22 LAB — RETICULOCYTES
Immature Retic Fract: 14.8 % (ref 2.3–15.9)
RBC.: 2.93 MIL/uL — ABNORMAL LOW (ref 3.87–5.11)
Retic Count, Absolute: 92 10*3/uL (ref 19.0–186.0)
Retic Ct Pct: 3.1 % (ref 0.4–3.1)

## 2019-03-22 LAB — FERRITIN: Ferritin: 234 ng/mL (ref 11–307)

## 2019-03-22 NOTE — Progress Notes (Signed)
Hematology and Oncology Follow Up Visit  [name] [id number] 1955/11/07 63 y.o. 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  She is doing a little bit better.  Last time we saw her, she really had a tough time.  She had been in the ICU because of severe sepsis.  She is finally recovering from this.  She and her husband will be going to the [employer].  They will be there for about 4 or 5 days.  I do think that she is going to need Aranesp.  Her erythropoietin level is only 10.  She does have renal insufficiency, likely from her diabetes.  Her hemoglobin is 9.8.  I believe that her hemoglobin and anemia will improve nicely with Aranesp..  I talked to her about Aranesp.  I talked to her as to how it works.  She is on Eliquis so I do not think we should have a problem with respect to thromboembolic disease.  She has had no issues with bleeding.  Her appetite is good.  She has had no change in bowel or bladder habits..  She does have some rotator cuff issues with her right shoulder.   Currently, her performance status is ECOG 1.  Medications:  Allergies as of 03/22/2019      Reactions   Ativan [lorazepam]    Other Nausea And Vomiting, Swelling   Shrimp if eat a lot   Cleocin [clindamycin Hcl] Other (See Comments)   "irritated my esophagus"   Morphine And Related Nausea And Vomiting   Pneumococcal Vaccines Other (See Comments)   Really high fever, and flu symptoms. MD instructed not to give   Buprenorphine Hcl Nausea And Vomiting      Medication List       Accurate as of March 22, 2019 11:38 AM. If you have any questions, ask your nurse or doctor.        STOP taking  these medications   sitaGLIPtin 100 MG tablet Commonly known as: JANUVIA Stopped by: [name], MD     TAKE these medications   acetaminophen 500 MG tablet Commonly known as: TYLENOL Take 500 mg every 6 (six) hours as needed by mouth.   albuterol 108 (90 Base) MCG/ACT inhaler Commonly known as: VENTOLIN HFA Inhale 2 puffs into the lungs as needed for wheezing or shortness of breath.   ALPRAZolam 0.25 MG tablet Commonly known as: XANAX Take 0.25 mg by mouth 3 (three) times daily as needed for anxiety.   atorvastatin 10 MG tablet Commonly known as: LIPITOR Take 10 mg by mouth daily at 6 PM.   carvedilol 6.25 MG tablet Commonly known as: COREG Take 6.25 mg by mouth 2 (two) times daily with a meal.   Eliquis 5 MG Tabs tablet Generic drug: apixaban Take 1 tablet (5 mg total) by mouth 2 (two) times daily.   flecainide 50 MG tablet Commonly known as: TAMBOCOR Take 50 mg by mouth 2 (two) times daily.   fluticasone 50 MCG/ACT nasal spray Commonly known as: FLONASE PLACE 1 SPRAY INTO BOTH NOSTRILS 2 (TWO) TIMES DAILY.   glipiZIDE 5 MG 24 hr tablet Commonly known as: GLUCOTROL XL Take 10 mg by  mouth daily with breakfast.   ipratropium 0.06 % nasal spray Commonly known as: ATROVENT Place 2 sprays into both nostrils 3 (three) times daily.   loratadine 10 MG tablet Commonly known as: CLARITIN Take 10 mg by mouth daily as needed for allergies.   metFORMIN 1000 MG tablet Commonly known as: GLUCOPHAGE Take 1,000 mg by mouth 2 (two) times daily with a meal.   montelukast 10 MG tablet Commonly known as: SINGULAIR TAKE 1 TABLET BY MOUTH EVERYDAY AT BEDTIME   omeprazole 20 MG capsule Commonly known as: PRILOSEC Take 20 mg by mouth daily.   sacubitril-valsartan 24-26 MG Commonly known as: Entresto Take 1 tablet by mouth 2 (two) times daily.   sertraline 50 MG tablet Commonly known as: ZOLOFT Take 50 mg by mouth daily as needed (anxiety).    sulfamethoxazole-trimethoprim 800-160 MG tablet Commonly known as: BACTRIM DS TAKE 1 TABLET BY MOUTH TWICE A DAY FOR 7 DAYS,THEN 1 TABLET A DAY       Allergies:  Allergies  Allergen Reactions  . Ativan [Lorazepam]   . Other Nausea And Vomiting and Swelling    Shrimp if eat a lot  . Cleocin [Clindamycin Hcl] Other (See Comments)    "irritated my esophagus"  . Morphine And Related Nausea And Vomiting  . Pneumococcal Vaccines Other (See Comments)    Really high fever, and flu symptoms. MD instructed not to give  . Buprenorphine Hcl Nausea And Vomiting    Past Medical History, Surgical history, Social history, and Family History were reviewed and updated.  Review of Systems: Review of Systems  Constitutional: Negative.   HENT: Negative.   Eyes: Negative.   Respiratory: Negative.   Cardiovascular: Negative.   Gastrointestinal: Negative.   Genitourinary: Negative.   Musculoskeletal: Negative.   Skin: Negative.   Neurological: Negative.   Endo/Heme/Allergies: Negative.   Psychiatric/Behavioral: Negative.      Physical Exam:  weight is 151 lb (68.5 kg). Her temporal temperature is 97.5 F (36.4 C) (abnormal). Her blood pressure is 116/64 and her pulse is 79. Her respiration is 16 and oxygen saturation is 100%.   Wt Readings from Last 3 Encounters:  03/22/19 151 lb (68.5 kg)  01/23/19 143 lb (64.9 kg)  12/19/18 143 lb 12.8 oz (65.2 kg)   Physical Exam Vitals signs reviewed.  Constitutional:      Comments: Breast exam shows right breast with no masses, edema or erythema.  There is no right axillary adenopathy.  Left breast is contracted from surgery and radiation therapy.  He has a lumpectomy scar at the areola.  There is some slight firmness at the lumpectomy site.  No distinct masses noted.  There is no left axillary adenopathy.  HENT:     Head: Normocephalic and atraumatic.  Eyes:     Pupils: Pupils are equal, round, and reactive to light.  Neck:      Musculoskeletal: Normal range of motion.  Cardiovascular:     Rate and Rhythm: Normal rate and regular rhythm.     Heart sounds: Normal heart sounds.  Pulmonary:     Effort: Pulmonary effort is normal.     Breath sounds: Normal breath sounds.  Abdominal:     General: Bowel sounds are normal.     Palpations: Abdomen is soft.  Musculoskeletal: Normal range of motion.        General: No tenderness or deformity.  Lymphadenopathy:     Cervical: No cervical adenopathy.  Skin:    General: Skin is warm and  dry.     Findings: No erythema or rash.  Neurological:     Mental Status: She is alert and oriented to person, place, and time.  Psychiatric:        Behavior: Behavior normal.        Thought Content: Thought content normal.        Judgment: Judgment normal.       Lab Results  Component Value Date   WBC 6.4 03/22/2019   HGB 9.8 (L) 03/22/2019   HCT 31.5 (L) 03/22/2019   MCV 107.1 (H) 03/22/2019   PLT 249 03/22/2019   Lab Results  Component Value Date   FERRITIN 124 12/19/2018   IRON 50 12/19/2018   TIBC 275 12/19/2018   UIBC 225 12/19/2018   IRONPCTSAT 18 (L) 12/19/2018   Lab Results  Component Value Date   RETICCTPCT 3.1 03/22/2019   RBC 2.93 (L) 03/22/2019   RBC 2.94 (L) 03/22/2019   No results found for: Nils Pyle [facility] Lab Results  Component Value Date   IGGSERUM 789 11/15/2016   IGA 150 11/15/2016   IGMSERUM 86 11/15/2016   No results found for: Odetta Pink, SPEI   Chemistry      Component Value Date/Time   NA 139 03/22/2019 1001   NA 140 08/31/2018 1052   NA 144 04/17/2017 1150   NA 141 04/18/2016 1256   K 5.4 (H) 03/22/2019 1001   K 4.6 04/17/2017 1150   K 4.4 04/18/2016 1256   CL 109 03/22/2019 1001   CL 109 (H) 04/17/2017 1150   CO2 22 03/22/2019 1001   CO2 23 04/17/2017 1150   CO2 18 (L) 04/18/2016 1256   BUN 35 (H) 03/22/2019 1001   BUN 19 08/31/2018 1052   BUN  28 (H) 04/17/2017 1150   BUN 27.3 (H) 04/18/2016 1256   CREATININE 1.60 (H) 03/22/2019 1001   CREATININE 1.3 (H) 04/17/2017 1150   CREATININE 1.4 (H) 04/18/2016 1256      Component Value Date/Time   CALCIUM 9.9 03/22/2019 1001   CALCIUM 9.6 04/17/2017 1150   CALCIUM 9.4 04/18/2016 1256   ALKPHOS 50 03/22/2019 1001   ALKPHOS 62 04/17/2017 1150   ALKPHOS 68 04/18/2016 1256   AST 16 03/22/2019 1001   AST 14 04/18/2016 1256   ALT 13 03/22/2019 1001   ALT 20 04/17/2017 1150   ALT 23 04/18/2016 1256   BILITOT 0.2 (L) 03/22/2019 1001   BILITOT <0.22 04/18/2016 1256       Impression and Plan: Ms. Konkel is a very pleasant 63 yo caucasian female with history of stage I ductal carcinoma of the left breast .  She is over 17 years out now.  I we will go ahead and get her started on Aranesp.  We will give her the first dose when she returns from the [employer].  I think that this will work nicely.  I will plan to see her back in a month.  I want to make sure that we follow her closely so that we can help improve her anemia particularly with the holiday season coming up.  I had a spent about 1/2-hour with her today.  We had a talk about the Aranesp, while we need to use it, the side effects, I had to put the protocol into the system.   [name], MD 9/18/202011:38 AM

## 2019-03-25 ENCOUNTER — Telehealth: Payer: Self-pay | Admitting: *Deleted

## 2019-03-25 NOTE — Telephone Encounter (Signed)
Left message for patient to notify her per order of Dr. Marin Olp that "the iron level is great!"  Instructed pt to call office back with any questions or concerns.

## 2019-03-25 NOTE — Telephone Encounter (Signed)
-----   Message from [name], MD sent at 03/22/2019  4:39 PM EDT ----- Call - the iron level is great!!  [name]

## 2019-03-28 ENCOUNTER — Inpatient Hospital Stay: Payer: Federal, State, Local not specified - PPO

## 2019-03-28 ENCOUNTER — Other Ambulatory Visit: Payer: Self-pay

## 2019-03-28 VITALS — BP 106/56 | HR 84 | Temp 98.2°F | Resp 16

## 2019-03-28 DIAGNOSIS — D508 Other iron deficiency anemias: Secondary | ICD-10-CM

## 2019-03-28 DIAGNOSIS — N189 Chronic kidney disease, unspecified: Secondary | ICD-10-CM | POA: Diagnosis not present

## 2019-03-28 MED ORDER — DARBEPOETIN ALFA 300 MCG/0.6ML IJ SOSY
300.0000 ug | PREFILLED_SYRINGE | Freq: Once | INTRAMUSCULAR | Status: AC
  Administered 2019-03-28: 13:00:00 300 ug via SUBCUTANEOUS

## 2019-03-28 NOTE — Patient Instructions (Signed)
Darbepoetin Alfa injection What is this medicine? DARBEPOETIN ALFA (dar be POE e tin AL fa) helps your body make more red blood cells. It is used to treat anemia caused by chronic kidney failure and chemotherapy. This medicine may be used for other purposes; ask your health care provider or pharmacist if you have questions. COMMON BRAND NAME(S): Aranesp What should I tell my health care provider before I take this medicine? They need to know if you have any of these conditions:  blood clotting disorders or history of blood clots  cancer patient not on chemotherapy  cystic fibrosis  heart disease, such as angina, heart failure, or a history of a heart attack  hemoglobin level of 12 g/dL or greater  high blood pressure  low levels of folate, iron, or vitamin B12  seizures  an unusual or allergic reaction to darbepoetin, erythropoietin, albumin, hamster proteins, latex, other medicines, foods, dyes, or preservatives  pregnant or trying to get pregnant  breast-feeding How should I use this medicine? This medicine is for injection into a vein or under the skin. It is usually given by a health care professional in a hospital or clinic setting. If you get this medicine at home, you will be taught how to prepare and give this medicine. Use exactly as directed. Take your medicine at regular intervals. Do not take your medicine more often than directed. It is important that you put your used needles and syringes in a special sharps container. Do not put them in a trash can. If you do not have a sharps container, call your pharmacist or healthcare provider to get one. A special MedGuide will be given to you by the pharmacist with each prescription and refill. Be sure to read this information carefully each time. Talk to your pediatrician regarding the use of this medicine in children. While this medicine may be used in children as young as 1 month of age for selected conditions, precautions do  apply. Overdosage: If you think you have taken too much of this medicine contact a poison control center or emergency room at once. NOTE: This medicine is only for you. Do not share this medicine with others. What if I miss a dose? If you miss a dose, take it as soon as you can. If it is almost time for your next dose, take only that dose. Do not take double or extra doses. What may interact with this medicine? Do not take this medicine with any of the following medications:  epoetin alfa This list may not describe all possible interactions. Give your health care provider a list of all the medicines, herbs, non-prescription drugs, or dietary supplements you use. Also tell them if you smoke, drink alcohol, or use illegal drugs. Some items may interact with your medicine. What should I watch for while using this medicine? Your condition will be monitored carefully while you are receiving this medicine. You may need blood work done while you are taking this medicine. This medicine may cause a decrease in vitamin B6. You should make sure that you get enough vitamin B6 while you are taking this medicine. Discuss the foods you eat and the vitamins you take with your health care professional. What side effects may I notice from receiving this medicine? Side effects that you should report to your doctor or health care professional as soon as possible:  allergic reactions like skin rash, itching or hives, swelling of the face, lips, or tongue  breathing problems  changes in   vision  chest pain  confusion, trouble speaking or understanding  feeling faint or lightheaded, falls  high blood pressure  muscle aches or pains  pain, swelling, warmth in the leg  rapid weight gain  severe headaches  sudden numbness or weakness of the face, arm or leg  trouble walking, dizziness, loss of balance or coordination  seizures (convulsions)  swelling of the ankles, feet, hands  unusually weak or  tired Side effects that usually do not require medical attention (report to your doctor or health care professional if they continue or are bothersome):  diarrhea  fever, chills (flu-like symptoms)  headaches  nausea, vomiting  redness, stinging, or swelling at site where injected This list may not describe all possible side effects. Call your doctor for medical advice about side effects. You may report side effects to FDA at 1-800-FDA-1088. Where should I keep my medicine? Keep out of the reach of children. Store in a refrigerator between 2 and 8 degrees C (36 and 46 degrees F). Do not freeze. Do not shake. Throw away any unused portion if using a single-dose vial. Throw away any unused medicine after the expiration date. NOTE: This sheet is a summary. It may not cover all possible information. If you have questions about this medicine, talk to your doctor, pharmacist, or health care provider.  2020 Elsevier/Gold Standard (2017-07-05 16:44:20)  

## 2019-04-19 ENCOUNTER — Other Ambulatory Visit: Payer: Self-pay

## 2019-04-19 ENCOUNTER — Encounter: Payer: Self-pay | Admitting: Hematology & Oncology

## 2019-04-19 ENCOUNTER — Inpatient Hospital Stay: Payer: Federal, State, Local not specified - PPO

## 2019-04-19 ENCOUNTER — Inpatient Hospital Stay: Payer: Federal, State, Local not specified - PPO | Attending: Hematology & Oncology | Admitting: Hematology & Oncology

## 2019-04-19 ENCOUNTER — Telehealth: Payer: Self-pay | Admitting: *Deleted

## 2019-04-19 VITALS — BP 110/52 | HR 84 | Temp 97.7°F | Resp 16 | Wt 153.0 lb

## 2019-04-19 DIAGNOSIS — D500 Iron deficiency anemia secondary to blood loss (chronic): Secondary | ICD-10-CM | POA: Diagnosis not present

## 2019-04-19 DIAGNOSIS — Z853 Personal history of malignant neoplasm of breast: Secondary | ICD-10-CM | POA: Diagnosis present

## 2019-04-19 DIAGNOSIS — D509 Iron deficiency anemia, unspecified: Secondary | ICD-10-CM | POA: Insufficient documentation

## 2019-04-19 DIAGNOSIS — I10 Essential (primary) hypertension: Secondary | ICD-10-CM | POA: Diagnosis not present

## 2019-04-19 DIAGNOSIS — J9690 Respiratory failure, unspecified, unspecified whether with hypoxia or hypercapnia: Secondary | ICD-10-CM | POA: Diagnosis not present

## 2019-04-19 DIAGNOSIS — C50011 Malignant neoplasm of nipple and areola, right female breast: Secondary | ICD-10-CM | POA: Diagnosis not present

## 2019-04-19 DIAGNOSIS — D631 Anemia in chronic kidney disease: Secondary | ICD-10-CM

## 2019-04-19 LAB — CMP (CANCER CENTER ONLY)
ALT: 15 U/L (ref 0–44)
AST: 15 U/L (ref 15–41)
Albumin: 4.6 g/dL (ref 3.5–5.0)
Alkaline Phosphatase: 68 U/L (ref 38–126)
Anion gap: 9 (ref 5–15)
BUN: 47 mg/dL — ABNORMAL HIGH (ref 8–23)
CO2: 16 mmol/L — ABNORMAL LOW (ref 22–32)
Calcium: 9.4 mg/dL (ref 8.9–10.3)
Chloride: 112 mmol/L — ABNORMAL HIGH (ref 98–111)
Creatinine: 1.81 mg/dL — ABNORMAL HIGH (ref 0.44–1.00)
GFR, Est AFR Am: 34 mL/min — ABNORMAL LOW (ref >60)
GFR, Estimated: 29 mL/min — ABNORMAL LOW (ref >60)
Glucose, Bld: 183 mg/dL — ABNORMAL HIGH (ref 70–99)
Potassium: 6 mmol/L — ABNORMAL HIGH (ref 3.5–5.1)
Sodium: 137 mmol/L (ref 135–145)
Total Bilirubin: 0.2 mg/dL — ABNORMAL LOW (ref 0.3–1.2)
Total Protein: 7.3 g/dL (ref 6.5–8.1)

## 2019-04-19 LAB — CBC WITH DIFFERENTIAL (CANCER CENTER ONLY)
Abs Immature Granulocytes: 0.07 10*3/uL (ref 0.00–0.07)
Basophils Absolute: 0.1 10*3/uL (ref 0.0–0.1)
Basophils Relative: 1 %
Eosinophils Absolute: 0.3 10*3/uL (ref 0.0–0.5)
Eosinophils Relative: 3 %
HCT: 38.6 % (ref 36.0–46.0)
Hemoglobin: 11.5 g/dL — ABNORMAL LOW (ref 12.0–15.0)
Immature Granulocytes: 1 %
Lymphocytes Relative: 23 %
Lymphs Abs: 1.9 10*3/uL (ref 0.7–4.0)
MCH: 32.4 pg (ref 26.0–34.0)
MCHC: 29.8 g/dL — ABNORMAL LOW (ref 30.0–36.0)
MCV: 108.7 fL — ABNORMAL HIGH (ref 80.0–100.0)
Monocytes Absolute: 0.3 10*3/uL (ref 0.1–1.0)
Monocytes Relative: 4 %
Neutro Abs: 5.4 10*3/uL (ref 1.7–7.7)
Neutrophils Relative %: 68 %
Platelet Count: 262 10*3/uL (ref 150–400)
RBC: 3.55 MIL/uL — ABNORMAL LOW (ref 3.87–5.11)
RDW: 14.2 % (ref 11.5–15.5)
WBC Count: 8 10*3/uL (ref 4.0–10.5)
nRBC: 0 % (ref 0.0–0.2)

## 2019-04-19 LAB — RETICULOCYTES
Immature Retic Fract: 15.6 % (ref 2.3–15.9)
RBC.: 3.3 MIL/uL — ABNORMAL LOW (ref 3.87–5.11)
Retic Count, Absolute: 116 10*3/uL (ref 19.0–186.0)
Retic Ct Pct: 3.5 % — ABNORMAL HIGH (ref 0.4–3.1)

## 2019-04-19 NOTE — Telephone Encounter (Signed)
Dr. Marin Olp notified of potassium-6.0.  No new orders received at this time.

## 2019-04-20 NOTE — Progress Notes (Signed)
Hematology and Oncology Follow Up Visit  [name] [id number] July 10, 1955 63 y.o. 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  She is actually feeling pretty well.  She has had no problems since we last saw her.  She seems to be responding to the Aranesp that we gave her.  Her potassium is going up slowly.  Her creatinine is also going up slowly.  I suspect this probably is from medications.  Her potassium was 6.0.  Her creatinine was 1.81.  Left a message for her cardiologist.  I am not sure if this is the [facility] that she is taking that might be doing this.  She is having no symptoms.  There is no shortness of breath.  She is having no chest wall pain.  She is having no palpitations.  A tight has been pretty good.  She has had no nausea or vomiting.  Is been no change in bowel or bladder habits.    Her blood sugars are on the high side today.  She does not have any, neuropathy.  There is no rashes.  She has had no bleeding.  We last saw her in September, her iron studies showed a ferritin of 234 with an iron saturation of 29%.  Currently, her performance status is ECOG 1.  Medications:  Allergies as of 04/19/2019      Reactions   Ativan [lorazepam]    Other Nausea And Vomiting, Swelling   Shrimp if eat a lot   Cleocin [clindamycin Hcl] Other (See Comments)   "irritated my esophagus"   Morphine And Related Nausea And Vomiting   Pneumococcal Vaccines Other (See Comments)   Really high fever, and flu symptoms. MD instructed not to give   Buprenorphine Hcl Nausea And Vomiting      Medication List       Accurate as of April 19, 2019 11:59 PM. If you have any questions, ask  your nurse or doctor.        acetaminophen 500 MG tablet Commonly known as: TYLENOL Take 500 mg every 6 (six) hours as needed by mouth.   albuterol 108 (90 Base) MCG/ACT inhaler Commonly known as: VENTOLIN HFA Inhale 2 puffs into the lungs as needed for wheezing or shortness of breath.   ALPRAZolam 0.25 MG tablet Commonly known as: XANAX Take 0.25 mg by mouth 3 (three) times daily as needed for anxiety.   atorvastatin 10 MG tablet Commonly known as: LIPITOR Take 10 mg by mouth daily at 6 PM.   carvedilol 6.25 MG tablet Commonly known as: COREG Take 6.25 mg by mouth 2 (two) times daily with a meal.   Eliquis 5 MG Tabs tablet Generic drug: apixaban Take 1 tablet (5 mg total) by mouth 2 (two) times daily.   flecainide 50 MG tablet Commonly known as: TAMBOCOR Take 50 mg by mouth 2 (two) times daily.   fluticasone 50 MCG/ACT nasal spray Commonly known as: FLONASE PLACE 1 SPRAY INTO BOTH NOSTRILS 2 (TWO) TIMES DAILY.   glipiZIDE 5 MG 24 hr tablet Commonly known as: GLUCOTROL XL Take 10 mg by mouth daily with breakfast.   hydrochlorothiazide 12.5 MG tablet Commonly known as: HYDRODIURIL Take 12.5 mg by  mouth daily.   ipratropium 0.06 % nasal spray Commonly known as: ATROVENT Place 2 sprays into both nostrils 3 (three) times daily.   Januvia 100 MG tablet Generic drug: sitaGLIPtin Take 100 mg by mouth daily.   loratadine 10 MG tablet Commonly known as: CLARITIN Take 10 mg by mouth daily as needed for allergies.   metFORMIN 1000 MG tablet Commonly known as: GLUCOPHAGE Take 1,000 mg by mouth 2 (two) times daily with a meal.   montelukast 10 MG tablet Commonly known as: SINGULAIR TAKE 1 TABLET BY MOUTH EVERYDAY AT BEDTIME   omeprazole 20 MG capsule Commonly known as: PRILOSEC Take 20 mg by mouth daily.   sacubitril-valsartan 24-26 MG Commonly known as: Entresto Take 1 tablet by mouth 2 (two) times daily.   sertraline 50 MG tablet Commonly known as: ZOLOFT  Take 50 mg by mouth daily as needed (anxiety).   sulfamethoxazole-trimethoprim 800-160 MG tablet Commonly known as: BACTRIM DS TAKE 1 TABLET BY MOUTH TWICE A DAY FOR 7 DAYS,THEN 1 TABLET A DAY       Allergies:  Allergies  Allergen Reactions  . Ativan [Lorazepam]   . Other Nausea And Vomiting and Swelling    Shrimp if eat a lot  . Cleocin [Clindamycin Hcl] Other (See Comments)    "irritated my esophagus"  . Morphine And Related Nausea And Vomiting  . Pneumococcal Vaccines Other (See Comments)    Really high fever, and flu symptoms. MD instructed not to give  . Buprenorphine Hcl Nausea And Vomiting    Past Medical History, Surgical history, Social history, and Family History were reviewed and updated.  Review of Systems: Review of Systems  Constitutional: Negative.   HENT: Negative.   Eyes: Negative.   Respiratory: Negative.   Cardiovascular: Negative.   Gastrointestinal: Negative.   Genitourinary: Negative.   Musculoskeletal: Negative.   Skin: Negative.   Neurological: Negative.   Endo/Heme/Allergies: Negative.   Psychiatric/Behavioral: Negative.      Physical Exam:  weight is 153 lb (69.4 kg). Her oral temperature is 97.7 F (36.5 C). Her blood pressure is 110/52 (abnormal) and her pulse is 84. Her respiration is 16 and oxygen saturation is 99%.   Wt Readings from Last 3 Encounters:  04/19/19 153 lb (69.4 kg)  03/22/19 151 lb (68.5 kg)  01/23/19 143 lb (64.9 kg)   Physical Exam Vitals signs reviewed.  Constitutional:      Comments: Breast exam shows right breast with no masses, edema or erythema.  There is no right axillary adenopathy.  Left breast is contracted from surgery and radiation therapy.  He has a lumpectomy scar at the areola.  There is some slight firmness at the lumpectomy site.  No distinct masses noted.  There is no left axillary adenopathy.  HENT:     Head: Normocephalic and atraumatic.  Eyes:     Pupils: Pupils are equal, round, and reactive  to light.  Neck:     Musculoskeletal: Normal range of motion.  Cardiovascular:     Rate and Rhythm: Normal rate and regular rhythm.     Heart sounds: Normal heart sounds.  Pulmonary:     Effort: Pulmonary effort is normal.     Breath sounds: Normal breath sounds.  Abdominal:     General: Bowel sounds are normal.     Palpations: Abdomen is soft.  Musculoskeletal: Normal range of motion.        General: No tenderness or deformity.  Lymphadenopathy:     Cervical: No cervical  adenopathy.  Skin:    General: Skin is warm and dry.     Findings: No erythema or rash.  Neurological:     Mental Status: She is alert and oriented to person, place, and time.  Psychiatric:        Behavior: Behavior normal.        Thought Content: Thought content normal.        Judgment: Judgment normal.       Lab Results  Component Value Date   WBC 8.0 04/19/2019   HGB 11.5 (L) 04/19/2019   HCT 38.6 04/19/2019   MCV 108.7 (H) 04/19/2019   PLT 262 04/19/2019   Lab Results  Component Value Date   FERRITIN 234 03/22/2019   IRON 76 03/22/2019   TIBC 263 03/22/2019   UIBC 186 03/22/2019   IRONPCTSAT 29 03/22/2019   Lab Results  Component Value Date   RETICCTPCT 3.5 (H) 04/19/2019   RBC 3.30 (L) 04/19/2019   No results found for: Nils Pyle [facility] Lab Results  Component Value Date   IGGSERUM 789 11/15/2016   IGA 150 11/15/2016   IGMSERUM 86 11/15/2016   No results found for: Odetta Pink, SPEI   Chemistry      Component Value Date/Time   NA 137 04/19/2019 1344   NA 140 08/31/2018 1052   NA 144 04/17/2017 1150   NA 141 04/18/2016 1256   K 6.0 (H) 04/19/2019 1344   K 4.6 04/17/2017 1150   K 4.4 04/18/2016 1256   CL 112 (H) 04/19/2019 1344   CL 109 (H) 04/17/2017 1150   CO2 16 (L) 04/19/2019 1344   CO2 23 04/17/2017 1150   CO2 18 (L) 04/18/2016 1256   BUN 47 (H) 04/19/2019 1344   BUN 19 08/31/2018 1052   BUN  28 (H) 04/17/2017 1150   BUN 27.3 (H) 04/18/2016 1256   CREATININE 1.81 (H) 04/19/2019 1344   CREATININE 1.3 (H) 04/17/2017 1150   CREATININE 1.4 (H) 04/18/2016 1256      Component Value Date/Time   CALCIUM 9.4 04/19/2019 1344   CALCIUM 9.6 04/17/2017 1150   CALCIUM 9.4 04/18/2016 1256   ALKPHOS 68 04/19/2019 1344   ALKPHOS 62 04/17/2017 1150   ALKPHOS 68 04/18/2016 1256   AST 15 04/19/2019 1344   AST 14 04/18/2016 1256   ALT 15 04/19/2019 1344   ALT 20 04/17/2017 1150   ALT 23 04/18/2016 1256   BILITOT 0.2 (L) 04/19/2019 1344   BILITOT <0.22 04/18/2016 1256       Impression and Plan: Ms. Mikelson is a very pleasant 63 yo caucasian female with history of stage I ductal carcinoma of the left breast .  She is over 17 years out now.  I do not see any problems with respect to breast cancer.  I believe that the bigger issue is going to be her cardiac status.  I know she is is being followed quite closely by cardiology.  Again, her potassium is on the high side.  Her creatinine is going up slowly.  Hopefully, her cardiologist will be able to see her.  Reply going to have to have her potassium repeated next week.  I will plan to see her back in another month or so.  I want to get her back before the holidays.  Thankfully, she does not need any Aranesp today.   [name], MD 10/17/202012:00 PM

## 2019-04-22 ENCOUNTER — Telehealth: Payer: Self-pay | Admitting: Hematology & Oncology

## 2019-04-22 LAB — IRON AND TIBC
Iron: 77 ug/dL (ref 41–142)
Saturation Ratios: 27 % (ref 21–57)
TIBC: 284 ug/dL (ref 236–444)
UIBC: 207 ug/dL (ref 120–384)

## 2019-04-22 LAB — FERRITIN: Ferritin: 112 ng/mL (ref 11–307)

## 2019-04-22 NOTE — Telephone Encounter (Signed)
Called and LMVm for patient with date/time of appointments added per 10/19 sch msg & 10/16 los

## 2019-04-23 ENCOUNTER — Telehealth: Payer: Self-pay | Admitting: Emergency Medicine

## 2019-04-23 DIAGNOSIS — I10 Essential (primary) hypertension: Secondary | ICD-10-CM

## 2019-04-23 NOTE — Telephone Encounter (Signed)
Left message for patient to return call regarding medication management.

## 2019-04-24 ENCOUNTER — Encounter: Payer: Self-pay | Admitting: *Deleted

## 2019-04-24 ENCOUNTER — Inpatient Hospital Stay: Payer: Federal, State, Local not specified - PPO

## 2019-04-24 ENCOUNTER — Other Ambulatory Visit: Payer: Self-pay

## 2019-04-24 DIAGNOSIS — Z853 Personal history of malignant neoplasm of breast: Secondary | ICD-10-CM | POA: Diagnosis not present

## 2019-04-24 DIAGNOSIS — I10 Essential (primary) hypertension: Secondary | ICD-10-CM

## 2019-04-24 LAB — BASIC METABOLIC PANEL - CANCER CENTER ONLY
Anion gap: 9 (ref 5–15)
BUN: 35 mg/dL — ABNORMAL HIGH (ref 8–23)
CO2: 19 mmol/L — ABNORMAL LOW (ref 22–32)
Calcium: 9.2 mg/dL (ref 8.9–10.3)
Chloride: 112 mmol/L — ABNORMAL HIGH (ref 98–111)
Creatinine: 1.39 mg/dL — ABNORMAL HIGH (ref 0.44–1.00)
GFR, Est AFR Am: 47 mL/min — ABNORMAL LOW (ref >60)
GFR, Estimated: 40 mL/min — ABNORMAL LOW (ref >60)
Glucose, Bld: 169 mg/dL — ABNORMAL HIGH (ref 70–99)
Potassium: 5.5 mmol/L — ABNORMAL HIGH (ref 3.5–5.1)
Sodium: 140 mmol/L (ref 135–145)

## 2019-04-24 NOTE — Telephone Encounter (Signed)
Called patient back and informed her to stop entresto per Dr. Agustin Cree and have labs rechecked in 1 week. She verbally understood.

## 2019-04-24 NOTE — Telephone Encounter (Signed)
Patient returned your call.

## 2019-04-24 NOTE — Addendum Note (Signed)
Addended by: Ashok Norris on: 04/24/2019 03:55 PM   Modules accepted: Orders

## 2019-05-02 LAB — BASIC METABOLIC PANEL
BUN/Creatinine Ratio: 29 — ABNORMAL HIGH (ref 12–28)
BUN: 38 mg/dL — ABNORMAL HIGH (ref 8–27)
CO2: 17 mmol/L — ABNORMAL LOW (ref 20–29)
Calcium: 9.3 mg/dL (ref 8.7–10.3)
Chloride: 109 mmol/L — ABNORMAL HIGH (ref 96–106)
Creatinine, Ser: 1.29 mg/dL — ABNORMAL HIGH (ref 0.57–1.00)
GFR calc Af Amer: 51 mL/min/{1.73_m2} — ABNORMAL LOW (ref >59)
GFR calc non Af Amer: 44 mL/min/{1.73_m2} — ABNORMAL LOW (ref >59)
Glucose: 71 mg/dL (ref 65–99)
Potassium: 5.8 mmol/L — ABNORMAL HIGH (ref 3.5–5.2)
Sodium: 140 mmol/L (ref 134–144)

## 2019-05-21 ENCOUNTER — Inpatient Hospital Stay: Payer: Federal, State, Local not specified - PPO

## 2019-05-21 ENCOUNTER — Other Ambulatory Visit: Payer: Self-pay

## 2019-05-21 ENCOUNTER — Inpatient Hospital Stay: Payer: Federal, State, Local not specified - PPO | Attending: Hematology & Oncology

## 2019-05-21 ENCOUNTER — Encounter: Payer: Self-pay | Admitting: Hematology & Oncology

## 2019-05-21 ENCOUNTER — Inpatient Hospital Stay (HOSPITAL_BASED_OUTPATIENT_CLINIC_OR_DEPARTMENT_OTHER): Payer: Federal, State, Local not specified - PPO | Admitting: Hematology & Oncology

## 2019-05-21 VITALS — BP 114/52 | HR 86 | Temp 98.0°F | Wt 153.1 lb

## 2019-05-21 DIAGNOSIS — D631 Anemia in chronic kidney disease: Secondary | ICD-10-CM | POA: Diagnosis not present

## 2019-05-21 DIAGNOSIS — C50011 Malignant neoplasm of nipple and areola, right female breast: Secondary | ICD-10-CM

## 2019-05-21 DIAGNOSIS — Z853 Personal history of malignant neoplasm of breast: Secondary | ICD-10-CM | POA: Insufficient documentation

## 2019-05-21 DIAGNOSIS — I10 Essential (primary) hypertension: Secondary | ICD-10-CM

## 2019-05-21 DIAGNOSIS — N189 Chronic kidney disease, unspecified: Secondary | ICD-10-CM | POA: Insufficient documentation

## 2019-05-21 DIAGNOSIS — Z23 Encounter for immunization: Secondary | ICD-10-CM | POA: Diagnosis not present

## 2019-05-21 DIAGNOSIS — D508 Other iron deficiency anemias: Secondary | ICD-10-CM

## 2019-05-21 DIAGNOSIS — D500 Iron deficiency anemia secondary to blood loss (chronic): Secondary | ICD-10-CM

## 2019-05-21 LAB — CBC WITH DIFFERENTIAL (CANCER CENTER ONLY)
Abs Immature Granulocytes: 0.04 10*3/uL (ref 0.00–0.07)
Basophils Absolute: 0.1 10*3/uL (ref 0.0–0.1)
Basophils Relative: 1 %
Eosinophils Absolute: 0.2 10*3/uL (ref 0.0–0.5)
Eosinophils Relative: 3 %
HCT: 33.5 % — ABNORMAL LOW (ref 36.0–46.0)
Hemoglobin: 10.6 g/dL — ABNORMAL LOW (ref 12.0–15.0)
Immature Granulocytes: 1 %
Lymphocytes Relative: 23 %
Lymphs Abs: 1.9 10*3/uL (ref 0.7–4.0)
MCH: 32.3 pg (ref 26.0–34.0)
MCHC: 31.6 g/dL (ref 30.0–36.0)
MCV: 102.1 fL — ABNORMAL HIGH (ref 80.0–100.0)
Monocytes Absolute: 0.4 10*3/uL (ref 0.1–1.0)
Monocytes Relative: 5 %
Neutro Abs: 5.4 10*3/uL (ref 1.7–7.7)
Neutrophils Relative %: 67 %
Platelet Count: 232 10*3/uL (ref 150–400)
RBC: 3.28 MIL/uL — ABNORMAL LOW (ref 3.87–5.11)
RDW: 14.3 % (ref 11.5–15.5)
WBC Count: 8 10*3/uL (ref 4.0–10.5)
nRBC: 0 % (ref 0.0–0.2)

## 2019-05-21 LAB — CMP (CANCER CENTER ONLY)
ALT: 17 U/L (ref 0–44)
AST: 15 U/L (ref 15–41)
Albumin: 4.8 g/dL (ref 3.5–5.0)
Alkaline Phosphatase: 66 U/L (ref 38–126)
Anion gap: 9 (ref 5–15)
BUN: 42 mg/dL — ABNORMAL HIGH (ref 8–23)
CO2: 20 mmol/L — ABNORMAL LOW (ref 22–32)
Calcium: 9.6 mg/dL (ref 8.9–10.3)
Chloride: 110 mmol/L (ref 98–111)
Creatinine: 1.61 mg/dL — ABNORMAL HIGH (ref 0.44–1.00)
GFR, Est AFR Am: 39 mL/min — ABNORMAL LOW (ref >60)
GFR, Estimated: 33 mL/min — ABNORMAL LOW (ref >60)
Glucose, Bld: 181 mg/dL — ABNORMAL HIGH (ref 70–99)
Potassium: 5.5 mmol/L — ABNORMAL HIGH (ref 3.5–5.1)
Sodium: 139 mmol/L (ref 135–145)
Total Bilirubin: 0.3 mg/dL (ref 0.3–1.2)
Total Protein: 7 g/dL (ref 6.5–8.1)

## 2019-05-21 LAB — RETICULOCYTES
Immature Retic Fract: 16.2 % — ABNORMAL HIGH (ref 2.3–15.9)
RBC.: 3.32 MIL/uL — ABNORMAL LOW (ref 3.87–5.11)
Retic Count, Absolute: 93 10*3/uL (ref 19.0–186.0)
Retic Ct Pct: 2.8 % (ref 0.4–3.1)

## 2019-05-21 MED ORDER — DARBEPOETIN ALFA 300 MCG/0.6ML IJ SOSY
PREFILLED_SYRINGE | INTRAMUSCULAR | Status: AC
  Filled 2019-05-21: qty 0.6

## 2019-05-21 MED ORDER — DARBEPOETIN ALFA 300 MCG/0.6ML IJ SOSY
300.0000 ug | PREFILLED_SYRINGE | Freq: Once | INTRAMUSCULAR | Status: AC
  Administered 2019-05-21: 300 ug via SUBCUTANEOUS

## 2019-05-21 MED ORDER — INFLUENZA VAC SPLIT QUAD 0.5 ML IM SUSY
0.5000 mL | PREFILLED_SYRINGE | Freq: Once | INTRAMUSCULAR | Status: AC
  Administered 2019-05-21: 0.5 mL via INTRAMUSCULAR

## 2019-05-21 MED ORDER — INFLUENZA VAC SPLIT QUAD 0.5 ML IM SUSY
PREFILLED_SYRINGE | INTRAMUSCULAR | Status: AC
  Filled 2019-05-21: qty 0.5

## 2019-05-21 NOTE — Progress Notes (Signed)
Hematology and Oncology Follow Up Visit  [name] [id number] January 23, 1956 63 y.o. 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  She is doing a little bit better than when we last saw her.  Her blood sugars are little bit better.  She has had some adjustments with her medications.  Her potassium is also a little bit lower.  She really has had no complaints.  She is doing well on the Aranesp.  She really has had no issues with Aranesp.  She does improve her blood counts which overall helps her cardiac status.  When we last saw her back in October, her ferritin was 112 with a iron saturation of 27%.  She has not noted any problems with bleeding.  She has had no leg swelling.  She says that she does have some occasional muscle spasms on the left side where she had the lumpectomy.  I suspect this probably is just scar tissue from surgery and radiation treatments.  Currently, her performance status is ECOG 1.  Medications:  Allergies as of 05/21/2019      Reactions   Ativan [lorazepam]    Other Nausea And Vomiting, Swelling   Shrimp if eat a lot   Cleocin [clindamycin Hcl] Other (See Comments)   "irritated my esophagus"   Morphine And Related Nausea And Vomiting   Pneumococcal Vaccines Other (See Comments)   Really high fever, and flu symptoms. MD instructed not to give   Buprenorphine Hcl Nausea And Vomiting      Medication List       Accurate as of May 21, 2019 11:55 AM. If you have any questions, ask your nurse or doctor.        acetaminophen 500 MG tablet Commonly known as: TYLENOL Take 500 mg every 6 (six) hours as needed by mouth.   albuterol 108 (90 Base) MCG/ACT  inhaler Commonly known as: VENTOLIN HFA Inhale 2 puffs into the lungs as needed for wheezing or shortness of breath.   ALPRAZolam 0.25 MG tablet Commonly known as: XANAX Take 0.25 mg by mouth 3 (three) times daily as needed for anxiety.   atorvastatin 10 MG tablet Commonly known as: LIPITOR Take 10 mg by mouth daily at 6 PM.   carvedilol 6.25 MG tablet Commonly known as: COREG Take 6.25 mg by mouth 2 (two) times daily with a meal.   Eliquis 5 MG Tabs tablet Generic drug: apixaban Take 1 tablet (5 mg total) by mouth 2 (two) times daily.   flecainide 50 MG tablet Commonly known as: TAMBOCOR Take 50 mg by mouth 2 (two) times daily.   fluticasone 50 MCG/ACT nasal spray Commonly known as: FLONASE PLACE 1 SPRAY INTO BOTH NOSTRILS 2 (TWO) TIMES DAILY.   glipiZIDE 5 MG 24 hr tablet Commonly known as: GLUCOTROL XL Take 10 mg by mouth daily with breakfast.   Glucagon Emergency 1 MG injection Generic drug: glucagon Inject 1 mg into the muscle as needed.   hydrochlorothiazide 12.5 MG tablet Commonly known as: HYDRODIURIL Take 12.5 mg by mouth daily.   ipratropium 0.06 % nasal spray Commonly known as: ATROVENT Place 2 sprays into both nostrils 3 (three) times daily.   Januvia 100 MG tablet  Generic drug: sitaGLIPtin Take 100 mg by mouth daily.   loratadine 10 MG tablet Commonly known as: CLARITIN Take 10 mg by mouth daily as needed for allergies.   metFORMIN 1000 MG tablet Commonly known as: GLUCOPHAGE Take 1,000 mg by mouth 2 (two) times daily with a meal.   montelukast 10 MG tablet Commonly known as: SINGULAIR TAKE 1 TABLET BY MOUTH EVERYDAY AT BEDTIME   Myrbetriq 50 MG Tb24 tablet Generic drug: mirabegron ER Take 50 mg by mouth as needed.   omeprazole 20 MG capsule Commonly known as: PRILOSEC Take 20 mg by mouth daily.   oxyCODONE-acetaminophen 5-325 MG tablet Commonly known as: PERCOCET/ROXICET Take 1 tablet by mouth every 6 (six) hours as needed.    sacubitril-valsartan 24-26 MG Commonly known as: Entresto Take 1 tablet by mouth 2 (two) times daily.   sertraline 50 MG tablet Commonly known as: ZOLOFT Take 50 mg by mouth daily as needed (anxiety).   sulfamethoxazole-trimethoprim 800-160 MG tablet Commonly known as: BACTRIM DS TAKE 1 TABLET BY MOUTH TWICE A DAY FOR 7 DAYS,THEN 1 TABLET A DAY       Allergies:  Allergies  Allergen Reactions  . Ativan [Lorazepam]   . Other Nausea And Vomiting and Swelling    Shrimp if eat a lot  . Cleocin [Clindamycin Hcl] Other (See Comments)    "irritated my esophagus"  . Morphine And Related Nausea And Vomiting  . Pneumococcal Vaccines Other (See Comments)    Really high fever, and flu symptoms. MD instructed not to give  . Buprenorphine Hcl Nausea And Vomiting    Past Medical History, Surgical history, Social history, and Family History were reviewed and updated.  Review of Systems: Review of Systems  Constitutional: Negative.   HENT: Negative.   Eyes: Negative.   Respiratory: Negative.   Cardiovascular: Negative.   Gastrointestinal: Negative.   Genitourinary: Negative.   Musculoskeletal: Negative.   Skin: Negative.   Neurological: Negative.   Endo/Heme/Allergies: Negative.   Psychiatric/Behavioral: Negative.      Physical Exam:  weight is 153 lb 1.9 oz (69.5 kg). Her oral temperature is 98 F (36.7 C). Her blood pressure is 114/52 (abnormal) and her pulse is 86. Her oxygen saturation is 100%.   Wt Readings from Last 3 Encounters:  05/21/19 153 lb 1.9 oz (69.5 kg)  04/19/19 153 lb (69.4 kg)  03/22/19 151 lb (68.5 kg)   Physical Exam Vitals signs reviewed.  Constitutional:      Comments: Breast exam shows right breast with no masses, edema or erythema.  There is no right axillary adenopathy.  Left breast is contracted from surgery and radiation therapy.  He has a lumpectomy scar at the areola.  There is some slight firmness at the lumpectomy site.  No distinct masses  noted.  There is no left axillary adenopathy.  HENT:     Head: Normocephalic and atraumatic.  Eyes:     Pupils: Pupils are equal, round, and reactive to light.  Neck:     Musculoskeletal: Normal range of motion.  Cardiovascular:     Rate and Rhythm: Normal rate and regular rhythm.     Heart sounds: Normal heart sounds.  Pulmonary:     Effort: Pulmonary effort is normal.     Breath sounds: Normal breath sounds.  Abdominal:     General: Bowel sounds are normal.     Palpations: Abdomen is soft.  Musculoskeletal: Normal range of motion.        General: No tenderness or  deformity.  Lymphadenopathy:     Cervical: No cervical adenopathy.  Skin:    General: Skin is warm and dry.     Findings: No erythema or rash.  Neurological:     Mental Status: She is alert and oriented to person, place, and time.  Psychiatric:        Behavior: Behavior normal.        Thought Content: Thought content normal.        Judgment: Judgment normal.       Lab Results  Component Value Date   WBC 8.0 05/21/2019   HGB 10.6 (L) 05/21/2019   HCT 33.5 (L) 05/21/2019   MCV 102.1 (H) 05/21/2019   PLT 232 05/21/2019   Lab Results  Component Value Date   FERRITIN 112 04/19/2019   IRON 77 04/19/2019   TIBC 284 04/19/2019   UIBC 207 04/19/2019   IRONPCTSAT 27 04/19/2019   Lab Results  Component Value Date   RETICCTPCT 2.8 05/21/2019   RBC 3.32 (L) 05/21/2019   RBC 3.28 (L) 05/21/2019   No results found for: Nils Pyle [facility] Lab Results  Component Value Date   IGGSERUM 789 11/15/2016   IGA 150 11/15/2016   IGMSERUM 86 11/15/2016   No results found for: Odetta Pink, SPEI   Chemistry      Component Value Date/Time   NA 139 05/21/2019 1055   NA 140 05/01/2019 1536   NA 144 04/17/2017 1150   NA 141 04/18/2016 1256   K 5.5 (H) 05/21/2019 1055   K 4.6 04/17/2017 1150   K 4.4 04/18/2016 1256   CL 110 05/21/2019 1055    CL 109 (H) 04/17/2017 1150   CO2 20 (L) 05/21/2019 1055   CO2 23 04/17/2017 1150   CO2 18 (L) 04/18/2016 1256   BUN 42 (H) 05/21/2019 1055   BUN 38 (H) 05/01/2019 1536   BUN 28 (H) 04/17/2017 1150   BUN 27.3 (H) 04/18/2016 1256   CREATININE 1.61 (H) 05/21/2019 1055   CREATININE 1.3 (H) 04/17/2017 1150   CREATININE 1.4 (H) 04/18/2016 1256      Component Value Date/Time   CALCIUM 9.6 05/21/2019 1055   CALCIUM 9.6 04/17/2017 1150   CALCIUM 9.4 04/18/2016 1256   ALKPHOS 66 05/21/2019 1055   ALKPHOS 62 04/17/2017 1150   ALKPHOS 68 04/18/2016 1256   AST 15 05/21/2019 1055   AST 14 04/18/2016 1256   ALT 17 05/21/2019 1055   ALT 20 04/17/2017 1150   ALT 23 04/18/2016 1256   BILITOT 0.3 05/21/2019 1055   BILITOT <0.22 04/18/2016 1256       Impression and Plan: Ms. Coven is a very pleasant 63 yo caucasian female with history of stage I ductal carcinoma of the left breast .  She is over 17 years out now.  I do not see any problems with respect to breast cancer.  For right now, she will get the Aranesp.  I want to make sure that we try to keep her hemoglobin above 11.  This will certainly improve her quality of life.  It sounds like she does not have a wonderful Thanksgiving and Christmas.  I would like to see her back right before Christmas so we can make sure that her blood counts are good to be okay so she can enjoy the Christmas and New Year holidays.    [name], MD 11/17/202011:55 AM

## 2019-05-21 NOTE — Patient Instructions (Signed)
Preventing Influenza, Adult Influenza, more commonly known as "the flu," is a viral infection that mainly affects the respiratory tract. The respiratory tract includes structures that help you breathe, such as the lungs, nose, and throat. The flu causes many common cold symptoms, as well as a high fever and body aches. The flu spreads easily from person to person (is contagious). The flu is most common from December through March. This is called flu season.You can catch the flu virus by:  Breathing in droplets from an infected person's cough or sneeze.  Touching something that was recently contaminated with the virus and then touching your mouth, nose, or eyes. What can I do to lower my risk?        You can decrease your risk of getting the flu by:  Getting a flu shot (influenza vaccination) every year. This is the best way to prevent the flu. A flu shot is recommended for everyone age 6 months and older. ? It is best to get a flu shot in the fall, as soon as it is available. Getting a flu shot during winter or spring instead is still a good idea. Flu season can last into early spring. ? Preventing the flu through vaccination requires getting a new flu shot every year. This is because the flu virus changes slightly (mutates) from one year to the next. Even if a flu shot does not completely protect you from all flu virus mutations, it can reduce the severity of your illness and prevent dangerous complications of the flu. ? If you are pregnant, you can and should get a flu shot. ? If you have had a reaction to the shot in the past or if you are allergic to eggs, check with your health care provider before getting a flu shot. ? Sometimes the vaccine is available as a nasal spray. In some years, the nasal spray has not been as effective against the flu virus. Check with your health care provider if you have questions about this.  Practicing good health habits. This is especially important during  flu season. ? Avoid contact with people who are sick with flu or cold symptoms. ? Wash your hands with soap and water often. If soap and water are not available, use alcohol-based hand sanitizer. ? Avoid touching your hands to your face, especially when you have not washed your hands recently. ? Use a disinfectant to clean surfaces at home and at work that may be contaminated with the flu virus. ? Keep your body's disease-fighting system (immune system) in good shape by eating a healthy diet, drinking plenty of fluids, getting enough sleep, and exercising regularly. If you do get the flu, avoid spreading it to others by:  Staying home until your symptoms have been gone for at least one day.  Covering your mouth and nose when you cough or sneeze.  Avoiding close contact with others, especially babies and elderly people. Why are these changes important? Getting a flu shot and practicing good health habits protects you as well as other people. If you get the flu, your friends, family, and co-workers are also at risk of getting it, because it spreads so easily to others. Each year, about 2 out of every 10 people get the flu. Having the flu can lead to complications, such as pneumonia, ear infection, and sinus infection. The flu also can be deadly, especially for babies, people older than age 65, and people who have serious long-term diseases. How is this treated? Most   people recover from the flu by resting at home and drinking plenty of fluids. However, a prescription antiviral medicine may reduce your flu symptoms and may make your flu go away sooner. This medicine must be started within a few days of getting flu symptoms. You can talk with your health care provider about whether you need an antiviral medicine. Antiviral medicine may be prescribed for people who are at risk for more serious flu symptoms. This includes people who:  Are older than age 32.  Are pregnant.  Have a condition that  makes the flu worse or more dangerous. Where to find more information  Centers for Disease Control and Prevention: http://www.smith-bell.org/  [employer].ee: azureicus.com  American Academy of Family Physicians: familydoctor.org/familydoctor/en/kids/vaccines/preventing-the-flu.html Contact a health care provider if:  You have influenza and you develop new symptoms.  You have: ? Chest pain. ? Diarrhea. ? A fever.  Your cough gets worse, or you produce more mucus. Summary  The best way to prevent the flu is to get a flu shot every year in the fall.  Even if you get the flu after you have received the yearly vaccine, your flu may be milder and go away sooner because of your flu shot.  If you get the flu, antiviral medicines that are started with a few days of symptoms may reduce your flu symptoms and may make your flu go away sooner.  You can also help prevent the flu by practicing good health habits. This information is not intended to replace advice given to you by your health care provider. Make sure you discuss any questions you have with your health care provider. Document Released: 07/05/2015 Document Revised: 06/02/2017 Document Reviewed: 02/27/2016 Elsevier Patient Education  [place]. Darbepoetin Alfa injection What is this medicine? DARBEPOETIN ALFA (dar be POE e tin AL fa) helps your body make more red blood cells. It is used to treat anemia caused by chronic kidney failure and chemotherapy. This medicine may be used for other purposes; ask your health care provider or pharmacist if you have questions. COMMON BRAND NAME(S): Aranesp What should I tell my health care provider before I take this medicine? They need to know if you have any of these conditions:  blood clotting disorders or history of blood clots  cancer patient not on chemotherapy  cystic fibrosis  heart disease, such as angina, heart failure, or a history of a heart attack   hemoglobin level of 12 g/dL or greater  high blood pressure  low levels of folate, iron, or vitamin B12  seizures  an unusual or allergic reaction to darbepoetin, erythropoietin, albumin, hamster proteins, latex, other medicines, foods, dyes, or preservatives  pregnant or trying to get pregnant  breast-feeding How should I use this medicine? This medicine is for injection into a vein or under the skin. It is usually given by a health care professional in a hospital or clinic setting. If you get this medicine at home, you will be taught how to prepare and give this medicine. Use exactly as directed. Take your medicine at regular intervals. Do not take your medicine more often than directed. It is important that you put your used needles and syringes in a special sharps container. Do not put them in a trash can. If you do not have a sharps container, call your pharmacist or healthcare provider to get one. A special MedGuide will be given to you by the pharmacist with each prescription and refill. Be sure to read this information carefully  each time. Talk to your pediatrician regarding the use of this medicine in children. While this medicine may be used in children as young as 28 month of age for selected conditions, precautions do apply. Overdosage: If you think you have taken too much of this medicine contact a poison control center or emergency room at once. NOTE: This medicine is only for you. Do not share this medicine with others. What if I miss a dose? If you miss a dose, take it as soon as you can. If it is almost time for your next dose, take only that dose. Do not take double or extra doses. What may interact with this medicine? Do not take this medicine with any of the following medications:  epoetin alfa This list may not describe all possible interactions. Give your health care provider a list of all the medicines, herbs, non-prescription drugs, or dietary supplements you use.  Also tell them if you smoke, drink alcohol, or use illegal drugs. Some items may interact with your medicine. What should I watch for while using this medicine? Your condition will be monitored carefully while you are receiving this medicine. You may need blood work done while you are taking this medicine. This medicine may cause a decrease in vitamin B6. You should make sure that you get enough vitamin B6 while you are taking this medicine. Discuss the foods you eat and the vitamins you take with your health care professional. What side effects may I notice from receiving this medicine? Side effects that you should report to your doctor or health care professional as soon as possible:  allergic reactions like skin rash, itching or hives, swelling of the face, lips, or tongue  breathing problems  changes in vision  chest pain  confusion, trouble speaking or understanding  feeling faint or lightheaded, falls  high blood pressure  muscle aches or pains  pain, swelling, warmth in the leg  rapid weight gain  severe headaches  sudden numbness or weakness of the face, arm or leg  trouble walking, dizziness, loss of balance or coordination  seizures (convulsions)  swelling of the ankles, feet, hands  unusually weak or tired Side effects that usually do not require medical attention (report to your doctor or health care professional if they continue or are bothersome):  diarrhea  fever, chills (flu-like symptoms)  headaches  nausea, vomiting  redness, stinging, or swelling at site where injected This list may not describe all possible side effects. Call your doctor for medical advice about side effects. You may report side effects to FDA at 1-800-FDA-1088. Where should I keep my medicine? Keep out of the reach of children. Store in a refrigerator between 2 and 8 degrees C (36 and 46 degrees F). Do not freeze. Do not shake. Throw away any unused portion if using a  single-dose vial. Throw away any unused medicine after the expiration date. NOTE: This sheet is a summary. It may not cover all possible information. If you have questions about this medicine, talk to your doctor, pharmacist, or health care provider.  2020 Elsevier/Gold Standard (2017-07-05 16:44:20)

## 2019-05-22 LAB — IRON AND TIBC
Iron: 100 ug/dL (ref 41–142)
Saturation Ratios: 36 % (ref 21–57)
TIBC: 275 ug/dL (ref 236–444)
UIBC: 175 ug/dL (ref 120–384)

## 2019-05-22 LAB — FERRITIN: Ferritin: 188 ng/mL (ref 11–307)

## 2019-06-07 ENCOUNTER — Other Ambulatory Visit: Payer: Self-pay | Admitting: Internal Medicine

## 2019-06-24 ENCOUNTER — Encounter: Payer: Self-pay | Admitting: Gastroenterology

## 2019-06-25 ENCOUNTER — Other Ambulatory Visit: Payer: Self-pay | Admitting: *Deleted

## 2019-06-25 ENCOUNTER — Ambulatory Visit: Payer: Federal, State, Local not specified - PPO | Admitting: Cardiology

## 2019-06-25 ENCOUNTER — Telehealth: Payer: Self-pay | Admitting: Cardiology

## 2019-06-25 MED ORDER — FLECAINIDE ACETATE 50 MG PO TABS: 50.0000 mg | ORAL_TABLET | Freq: Two times a day (BID) | ORAL | 1 refills | Status: DC

## 2019-06-25 NOTE — Telephone Encounter (Signed)
[facility] in [place]

## 2019-06-25 NOTE — Telephone Encounter (Signed)
Refill sent.

## 2019-06-26 ENCOUNTER — Inpatient Hospital Stay: Payer: Federal, State, Local not specified - PPO | Attending: Hematology & Oncology

## 2019-06-26 ENCOUNTER — Encounter: Payer: Self-pay | Admitting: Hematology & Oncology

## 2019-06-26 ENCOUNTER — Other Ambulatory Visit: Payer: Self-pay

## 2019-06-26 ENCOUNTER — Inpatient Hospital Stay: Payer: Federal, State, Local not specified - PPO | Admitting: Hematology & Oncology

## 2019-06-26 ENCOUNTER — Inpatient Hospital Stay: Payer: Federal, State, Local not specified - PPO

## 2019-06-26 VITALS — BP 120/71 | HR 103 | Temp 97.7°F | Resp 24 | Wt 154.0 lb

## 2019-06-26 DIAGNOSIS — D509 Iron deficiency anemia, unspecified: Secondary | ICD-10-CM | POA: Insufficient documentation

## 2019-06-26 DIAGNOSIS — Z853 Personal history of malignant neoplasm of breast: Secondary | ICD-10-CM | POA: Insufficient documentation

## 2019-06-26 DIAGNOSIS — E875 Hyperkalemia: Secondary | ICD-10-CM | POA: Diagnosis not present

## 2019-06-26 DIAGNOSIS — D649 Anemia, unspecified: Secondary | ICD-10-CM | POA: Diagnosis not present

## 2019-06-26 DIAGNOSIS — D500 Iron deficiency anemia secondary to blood loss (chronic): Secondary | ICD-10-CM

## 2019-06-26 LAB — CMP (CANCER CENTER ONLY)
ALT: 21 U/L (ref 0–44)
AST: 17 U/L (ref 15–41)
Albumin: 5 g/dL (ref 3.5–5.0)
Alkaline Phosphatase: 76 U/L (ref 38–126)
Anion gap: 9 (ref 5–15)
BUN: 47 mg/dL — ABNORMAL HIGH (ref 8–23)
CO2: 19 mmol/L — ABNORMAL LOW (ref 22–32)
Calcium: 9.9 mg/dL (ref 8.9–10.3)
Chloride: 111 mmol/L (ref 98–111)
Creatinine: 1.64 mg/dL — ABNORMAL HIGH (ref 0.44–1.00)
GFR, Est AFR Am: 38 mL/min — ABNORMAL LOW (ref >60)
GFR, Estimated: 33 mL/min — ABNORMAL LOW (ref >60)
Glucose, Bld: 173 mg/dL — ABNORMAL HIGH (ref 70–99)
Potassium: 6 mmol/L — ABNORMAL HIGH (ref 3.5–5.1)
Sodium: 139 mmol/L (ref 135–145)
Total Bilirubin: 0.2 mg/dL — ABNORMAL LOW (ref 0.3–1.2)
Total Protein: 8.1 g/dL (ref 6.5–8.1)

## 2019-06-26 LAB — CBC WITH DIFFERENTIAL (CANCER CENTER ONLY)
Abs Immature Granulocytes: 0.03 10*3/uL (ref 0.00–0.07)
Basophils Absolute: 0.1 10*3/uL (ref 0.0–0.1)
Basophils Relative: 1 %
Eosinophils Absolute: 0.2 10*3/uL (ref 0.0–0.5)
Eosinophils Relative: 2 %
HCT: 40.4 % (ref 36.0–46.0)
Hemoglobin: 12.5 g/dL (ref 12.0–15.0)
Immature Granulocytes: 0 %
Lymphocytes Relative: 25 %
Lymphs Abs: 2.3 10*3/uL (ref 0.7–4.0)
MCH: 31.7 pg (ref 26.0–34.0)
MCHC: 30.9 g/dL (ref 30.0–36.0)
MCV: 102.5 fL — ABNORMAL HIGH (ref 80.0–100.0)
Monocytes Absolute: 0.5 10*3/uL (ref 0.1–1.0)
Monocytes Relative: 5 %
Neutro Abs: 6.2 10*3/uL (ref 1.7–7.7)
Neutrophils Relative %: 67 %
Platelet Count: 266 10*3/uL (ref 150–400)
RBC: 3.94 MIL/uL (ref 3.87–5.11)
RDW: 15.7 % — ABNORMAL HIGH (ref 11.5–15.5)
WBC Count: 9.2 10*3/uL (ref 4.0–10.5)
nRBC: 0 % (ref 0.0–0.2)

## 2019-06-26 LAB — IRON AND TIBC
Iron: 111 ug/dL (ref 41–142)
Saturation Ratios: 35 % (ref 21–57)
TIBC: 318 ug/dL (ref 236–444)
UIBC: 206 ug/dL (ref 120–384)

## 2019-06-26 LAB — FERRITIN: Ferritin: 81 ng/mL (ref 11–307)

## 2019-06-26 NOTE — Progress Notes (Signed)
Hematology and Oncology Follow Up Visit  [name] [id number] [birth year] 63 y.o. 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  Overall, she is doing pretty well.  She feels well.  She really has no complaints.  The Aranesp really has helped.  Her hemoglobin responds quite nicely when we do give her Aranesp.  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  She has hyperkalemia.  Today, her potassium is 6.0.  We did do an EKG on her.  She had normal sinus rhythm.  There were no peaked "P" waves.  I think that her family doctor or cardiologist are going to have to handle this issue.  She is on hydrochlorothiazide.  She has had no problems with bowels or bladder.  Has been no diarrhea.  She has had no cough.  She has had no nausea or vomiting.  She did have a nice Thanksgiving with her family.  She is planning for a quiet Christmas with her family.  As far as her breast cancer is concerned, this really has not been an issue for [place].  Her anemia has definitely been a much more active problem.  Currently, her performance status is ECOG 1.  Medications:  Allergies as of 06/26/2019      Reactions   Ativan [lorazepam]    Other Nausea And Vomiting, Swelling   Shrimp if eat a lot   Cleocin [clindamycin Hcl] Other (See Comments)   "irritated my esophagus"   Morphine And Related Nausea And Vomiting   Pneumococcal Vaccines Other (See Comments)   Really high fever, and flu symptoms. MD instructed not to give   Buprenorphine Hcl Nausea And Vomiting      Medication List       Accurate as of June 26, 2019 11:31 AM. If you have any questions, ask your nurse or doctor.          STOP taking these medications   Januvia 100 MG tablet Generic drug: sitaGLIPtin Stopped by: [name], MD   Myrbetriq 50 MG Tb24 tablet Generic drug: mirabegron ER Stopped by: [name], MD   sacubitril-valsartan 24-26 MG Commonly known as: Entresto Stopped by: [name], MD     TAKE these medications   acetaminophen 500 MG tablet Commonly known as: TYLENOL Take 500 mg every 6 (six) hours as needed by mouth.   albuterol 108 (90 Base) MCG/ACT inhaler Commonly known as: VENTOLIN HFA Inhale 2 puffs into the lungs as needed for wheezing or shortness of breath.   ALPRAZolam 0.25 MG tablet Commonly known as: XANAX Take 0.25 mg by mouth 3 (three) times daily as needed for anxiety.   atorvastatin 10 MG tablet Commonly known as: LIPITOR Take 10 mg by mouth daily at 6 PM.   carvedilol 6.25 MG tablet Commonly known as: COREG Take 6.25 mg by mouth 2 (two) times daily with a meal.   Eliquis 5 MG Tabs tablet Generic drug: apixaban Take 1 tablet (5 mg total) by mouth 2 (two) times daily.   flecainide 50 MG tablet Commonly known as: TAMBOCOR Take 1 tablet (50 mg total) by mouth 2 (two) times daily.   fluticasone 50 MCG/ACT  nasal spray Commonly known as: FLONASE PLACE 1 SPRAY INTO BOTH NOSTRILS 2 (TWO) TIMES DAILY.   glipiZIDE 5 MG 24 hr tablet Commonly known as: GLUCOTROL XL Take 10 mg by mouth daily with breakfast.   Glucagon Emergency 1 MG Kit Inject 1 mg into the muscle as needed.   hydrochlorothiazide 12.5 MG tablet Commonly known as: HYDRODIURIL Take 12.5 mg by mouth daily.   ipratropium 0.06 % nasal spray Commonly known as: ATROVENT Place 2 sprays into both nostrils 3 (three) times daily.   loratadine 10 MG tablet Commonly known as: CLARITIN Take 10 mg by mouth daily as needed for allergies.   metFORMIN 1000 MG tablet Commonly known as: GLUCOPHAGE Take 1,000 mg by mouth 2 (two) times daily with a meal.   montelukast 10 MG  tablet Commonly known as: SINGULAIR TAKE 1 TABLET BY MOUTH EVERYDAY AT BEDTIME   omeprazole 20 MG capsule Commonly known as: PRILOSEC Take 20 mg by mouth daily.   oxyCODONE-acetaminophen 5-325 MG tablet Commonly known as: PERCOCET/ROXICET Take 1 tablet by mouth every 6 (six) hours as needed.   sertraline 50 MG tablet Commonly known as: ZOLOFT Take 50 mg by mouth daily as needed (anxiety).   sulfamethoxazole-trimethoprim 800-160 MG tablet Commonly known as: BACTRIM DS TAKE 1 TABLET BY MOUTH TWICE A DAY FOR 7 DAYS,THEN 1 TABLET A DAY       Allergies:  Allergies  Allergen Reactions  . Ativan [Lorazepam]   . Other Nausea And Vomiting and Swelling    Shrimp if eat a lot  . Cleocin [Clindamycin Hcl] Other (See Comments)    "irritated my esophagus"  . Morphine And Related Nausea And Vomiting  . Pneumococcal Vaccines Other (See Comments)    Really high fever, and flu symptoms. MD instructed not to give  . Buprenorphine Hcl Nausea And Vomiting    Past Medical History, Surgical history, Social history, and Family History were reviewed and updated.  Review of Systems: Review of Systems  Constitutional: Negative.   HENT: Negative.   Eyes: Negative.   Respiratory: Negative.   Cardiovascular: Negative.   Gastrointestinal: Negative.   Genitourinary: Negative.   Musculoskeletal: Negative.   Skin: Negative.   Neurological: Negative.   Endo/Heme/Allergies: Negative.   Psychiatric/Behavioral: Negative.      Physical Exam:  weight is 154 lb 0.7 oz (69.9 kg). Her temporal temperature is 97.7 F (36.5 C). Her blood pressure is 120/71 and her pulse is 103 (abnormal). Her respiration is 24 (abnormal) and oxygen saturation is 97%.   Wt Readings from Last 3 Encounters:  06/26/19 154 lb 0.7 oz (69.9 kg)  05/21/19 153 lb 1.9 oz (69.5 kg)  04/19/19 153 lb (69.4 kg)   Physical Exam Vitals reviewed.  Constitutional:      Comments: Breast exam shows right breast with no masses,  edema or erythema.  There is no right axillary adenopathy.  Left breast is contracted from surgery and radiation therapy.  He has a lumpectomy scar at the areola.  There is some slight firmness at the lumpectomy site.  No distinct masses noted.  There is no left axillary adenopathy.  HENT:     Head: Normocephalic and atraumatic.  Eyes:     Pupils: Pupils are equal, round, and reactive to light.  Cardiovascular:     Rate and Rhythm: Normal rate and regular rhythm.     Heart sounds: Normal heart sounds.  Pulmonary:     Effort: Pulmonary effort is normal.     Breath sounds:  Normal breath sounds.  Abdominal:     General: Bowel sounds are normal.     Palpations: Abdomen is soft.  Musculoskeletal:        General: No tenderness or deformity. Normal range of motion.     Cervical back: Normal range of motion.  Lymphadenopathy:     Cervical: No cervical adenopathy.  Skin:    General: Skin is warm and dry.     Findings: No erythema or rash.  Neurological:     Mental Status: She is alert and oriented to person, place, and time.  Psychiatric:        Behavior: Behavior normal.        Thought Content: Thought content normal.        Judgment: Judgment normal.       Lab Results  Component Value Date   WBC 9.2 06/26/2019   HGB 12.5 06/26/2019   HCT 40.4 06/26/2019   MCV 102.5 (H) 06/26/2019   PLT 266 06/26/2019   Lab Results  Component Value Date   FERRITIN 188 05/21/2019   IRON 100 05/21/2019   TIBC 275 05/21/2019   UIBC 175 05/21/2019   IRONPCTSAT 36 05/21/2019   Lab Results  Component Value Date   RETICCTPCT 2.8 05/21/2019   RBC 3.94 06/26/2019   No results found for: Nils Pyle [facility] Lab Results  Component Value Date   IGGSERUM 789 11/15/2016   IGA 150 11/15/2016   IGMSERUM 86 11/15/2016   No results found for: Odetta Pink, SPEI   Chemistry      Component Value Date/Time   NA 139 06/26/2019  1014   NA 140 05/01/2019 1536   NA 144 04/17/2017 1150   NA 141 04/18/2016 1256   K 6.0 (H) 06/26/2019 1014   K 4.6 04/17/2017 1150   K 4.4 04/18/2016 1256   CL 111 06/26/2019 1014   CL 109 (H) 04/17/2017 1150   CO2 19 (L) 06/26/2019 1014   CO2 23 04/17/2017 1150   CO2 18 (L) 04/18/2016 1256   BUN 47 (H) 06/26/2019 1014   BUN 38 (H) 05/01/2019 1536   BUN 28 (H) 04/17/2017 1150   BUN 27.3 (H) 04/18/2016 1256   CREATININE 1.64 (H) 06/26/2019 1014   CREATININE 1.3 (H) 04/17/2017 1150   CREATININE 1.4 (H) 04/18/2016 1256      Component Value Date/Time   CALCIUM 9.9 06/26/2019 1014   CALCIUM 9.6 04/17/2017 1150   CALCIUM 9.4 04/18/2016 1256   ALKPHOS 76 06/26/2019 1014   ALKPHOS 62 04/17/2017 1150   ALKPHOS 68 04/18/2016 1256   AST 17 06/26/2019 1014   AST 14 04/18/2016 1256   ALT 21 06/26/2019 1014   ALT 20 04/17/2017 1150   ALT 23 04/18/2016 1256   BILITOT 0.2 (L) 06/26/2019 1014   BILITOT <0.22 04/18/2016 1256       Impression and Plan: [name] is a very pleasant 63 yo caucasian female with history of stage I ductal carcinoma of the left breast .  She is over 17 years out now.  I do not see any problems with respect to breast cancer.  She does not need Aranesp today.  I am just very excited about this.  I know that her blood count is doing well so she should be able to enjoy Christmas and New Year's holidays.  She says she will make an appointment to see her family doctor.  She will need to have her potassium checked  next week.  Again, I suspect she probably has type IV renal tubular acidosis.  We will see her back in another month or so.    [name], MD 12/23/202011:31 AM

## 2019-07-02 IMAGING — CT CT ABDOMEN AND PELVIS WITHOUT CONTRAST
2 of 4 series · 16 of 46 positions shown, 18 images · non-contrast
Comparison: 09/21/2018

CLINICAL DATA: Sepsis

EXAM:
CT ABDOMEN AND PELVIS WITHOUT CONTRAST
TECHNIQUE: Multidetector CT imaging of the abdomen and pelvis was performed
following the standard protocol without IV contrast.

[Series 3: abd/ pelvis 5.0 i30f 2 · axial · 0.90mm/px · z∈[-726,-266]mm · 13 of 100 slices shown, 15 images]
[im 4/100  soft-tissue]
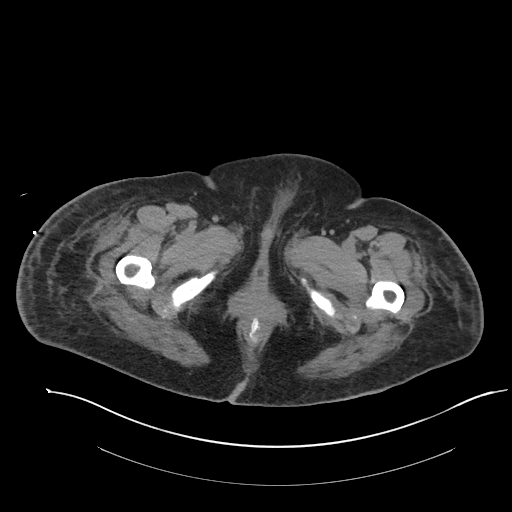
[im 4/100  bone]
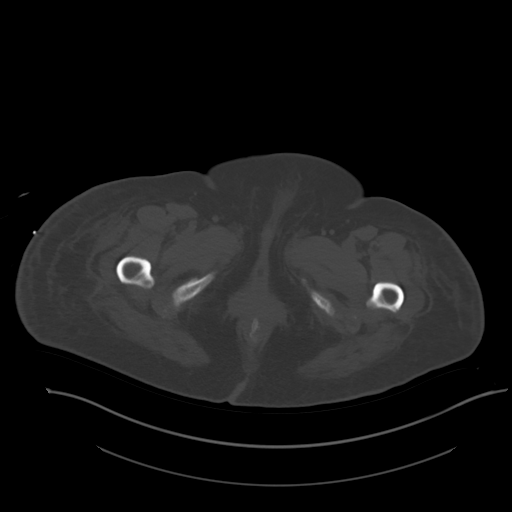
[im 12/100  soft-tissue]
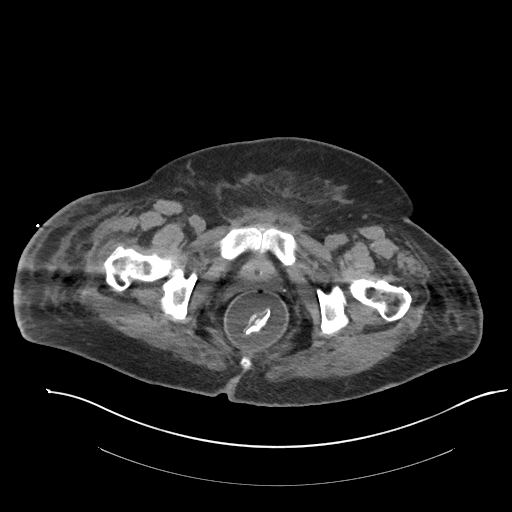
[im 20/100  soft-tissue]
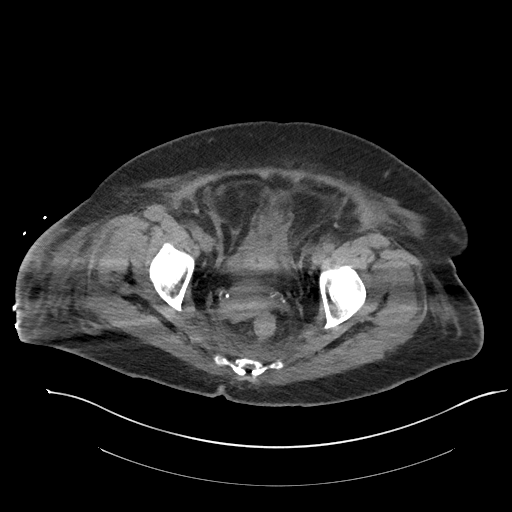
[im 27/100  soft-tissue]
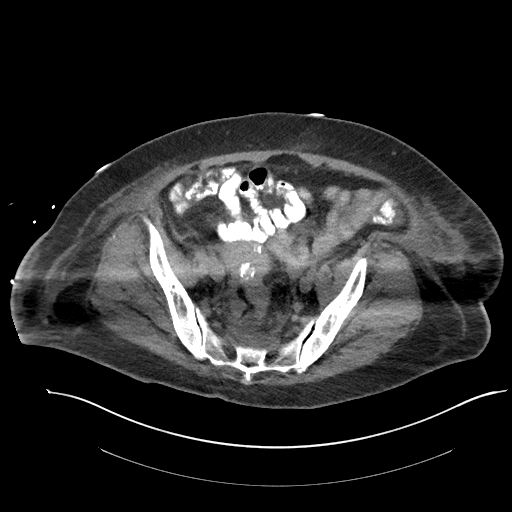
[im 35/100  soft-tissue]
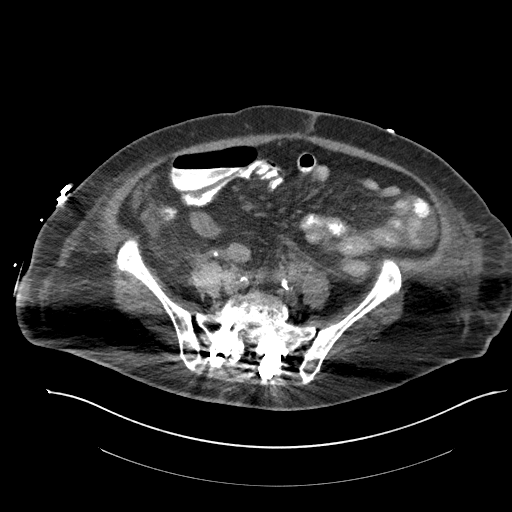
[im 42/100  soft-tissue]
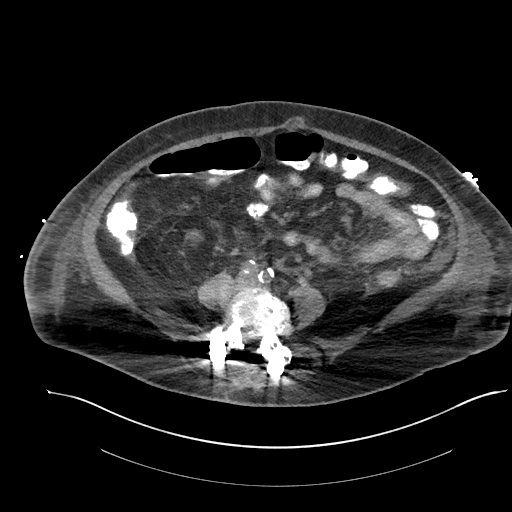
[im 50/100  soft-tissue]
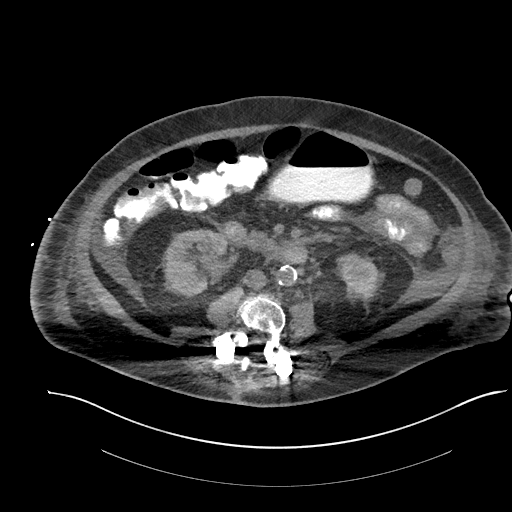
[im 58/100  soft-tissue]
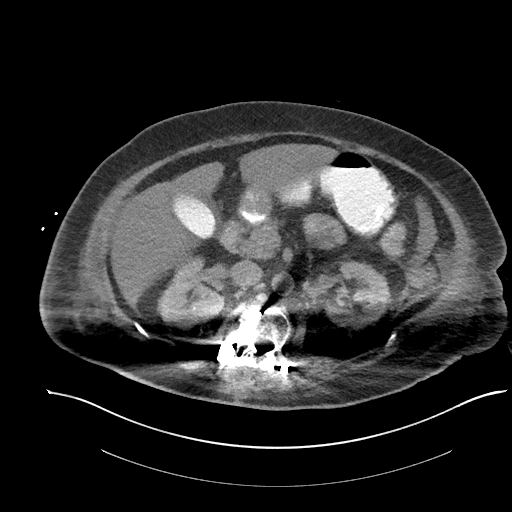
[im 65/100  soft-tissue]
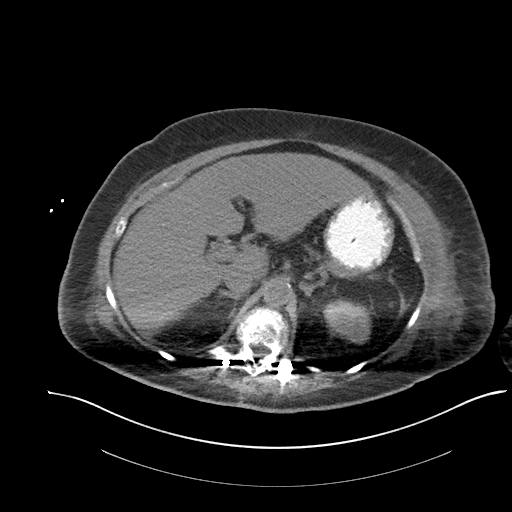
[im 65/100  bone]
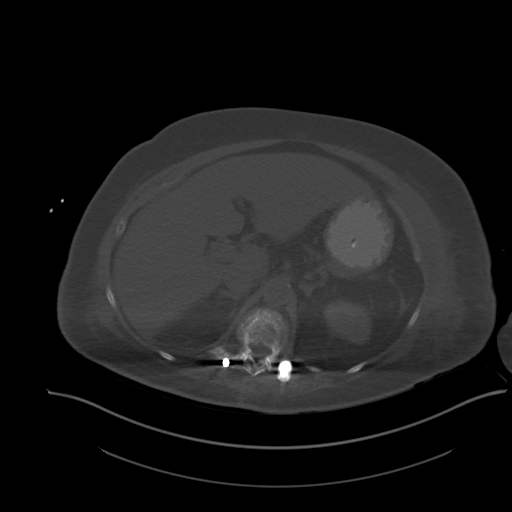
[im 73/100  soft-tissue]
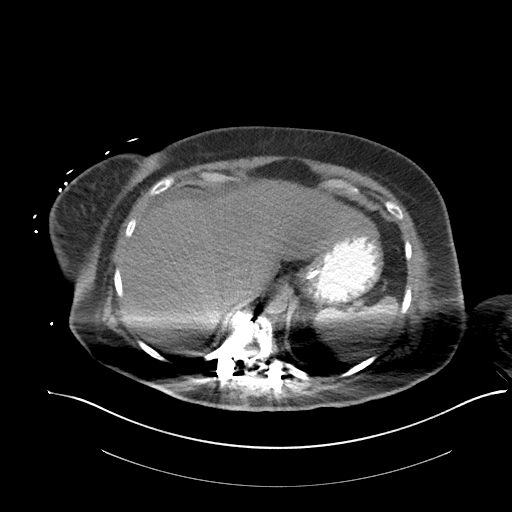
[im 80/100  soft-tissue]
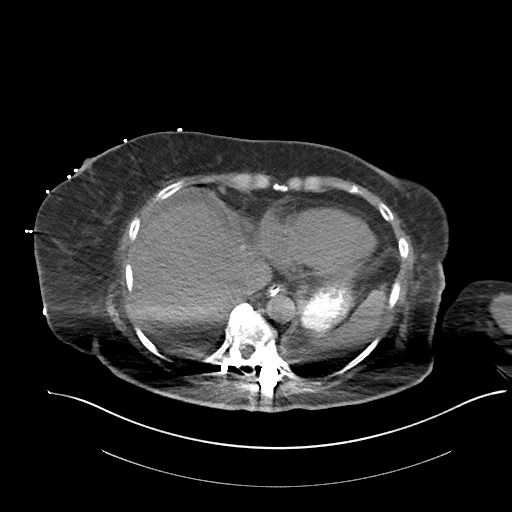
[im 88/100  soft-tissue]
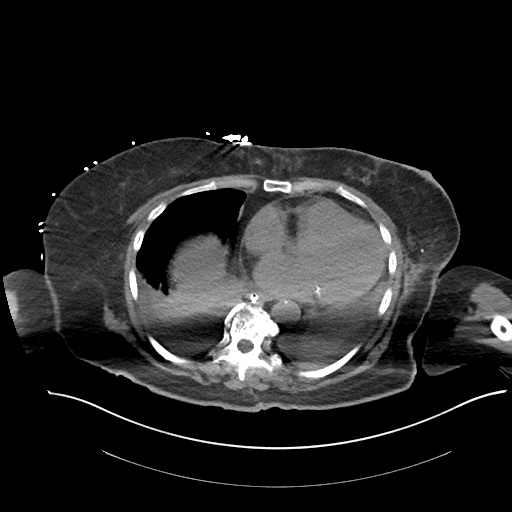
[im 96/100  soft-tissue]
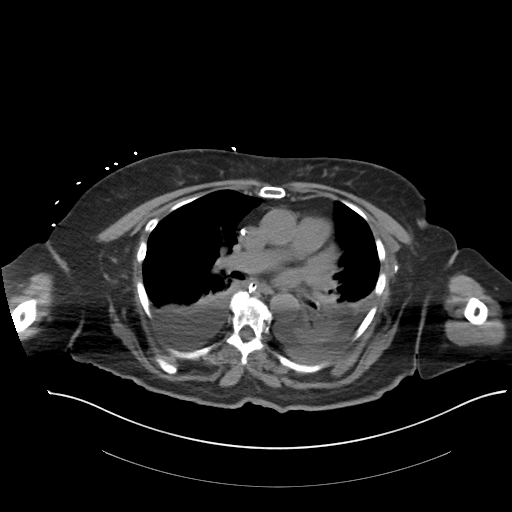

[Series 6: cor st · coronal · 0.89mm/px · 3 of 100 slices shown]
[im 34/100  soft-tissue]
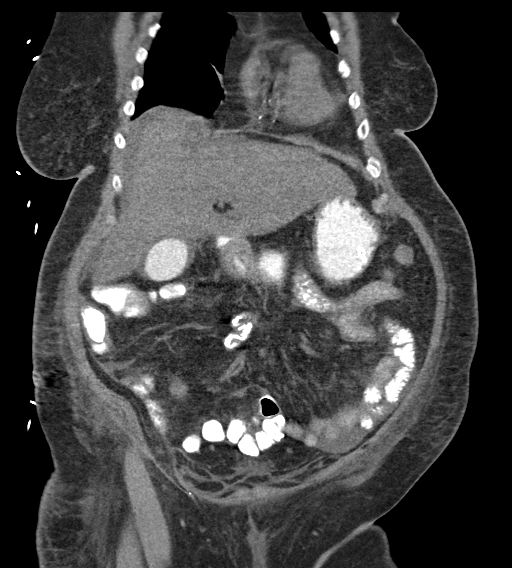
[im 45/100  soft-tissue]
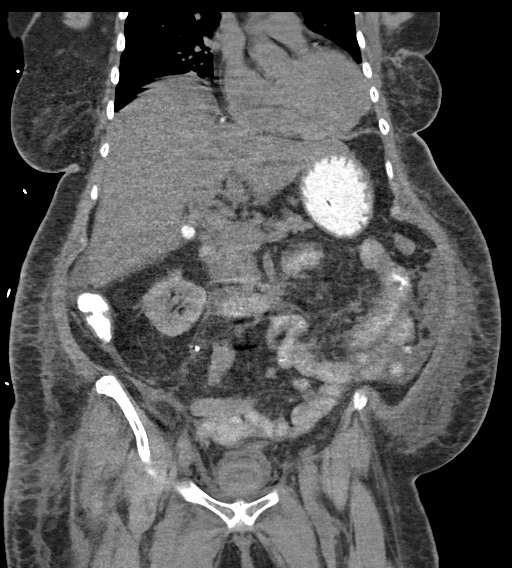
[im 56/100  soft-tissue]
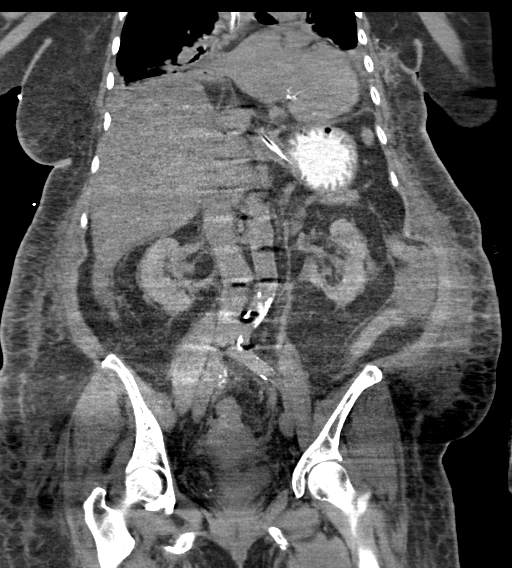

[16 of 46 positions shown; findings below may reference images not displayed]

FINDINGS: Lower chest: Small bilateral pleural effusions. Bibasilar airspace
disease.

Hepatobiliary: No focal liver abnormality is seen. Contrast fills
the entirety of the gallbladder. No gallstones, gallbladder wall
thickening, or biliary dilatation. Small amount of perihepatic
ascites.

Pancreas: Unremarkable. No pancreatic ductal dilatation or
surrounding inflammatory changes.

Spleen: Normal in size without focal abnormality.

Adrenals/Urinary Tract: Adrenal glands are unremarkable. Mild right
hydronephrosis. 1-2 mm nonobstructing right renal calculus.
Previously seen right UPJ calculus is not seen on the current exam.
No other ureteral calculi. No bladder calculi. Foley catheter within
the bladder.

Stomach/Bowel: Stomach is within normal limits. No evidence of bowel
wall thickening, distention, or inflammatory changes. Rectal
drainage tube is present.

Vascular/Lymphatic: Abdominal aortic atherosclerosis. No
lymphadenopathy.

Reproductive: Uterus and bilateral adnexa are unremarkable.

Other: Mild anasarca. Small amount pelvic free fluid. No drainable
fluid collection.

Musculoskeletal: No acute osseous abnormality. No aggressive osseous
lesion. Posterior lumbar fusion and decompression from T10 through
S1. Mild osteoarthritis of bilateral sacroiliac joints.
IMPRESSION: 1. Mild right hydronephrosis. 1-2 mm nonobstructing right renal
calculus. Previously seen right UPJ calculus is not seen on the
current exam. No other ureteral calculi.
2. Mild anasarca
3. Small bilateral pleural effusions. Bibasilar airspace disease
which may reflect atelectasis versus pneumonia.

## 2019-07-03 IMAGING — DX PORTABLE CHEST - 1 VIEW
1 series · 1 of 1 positions shown · non-contrast
Comparison: 09/26/2018

CLINICAL DATA: Respiratory failure.

EXAM:
PORTABLE CHEST 1 VIEW

[chest ap]
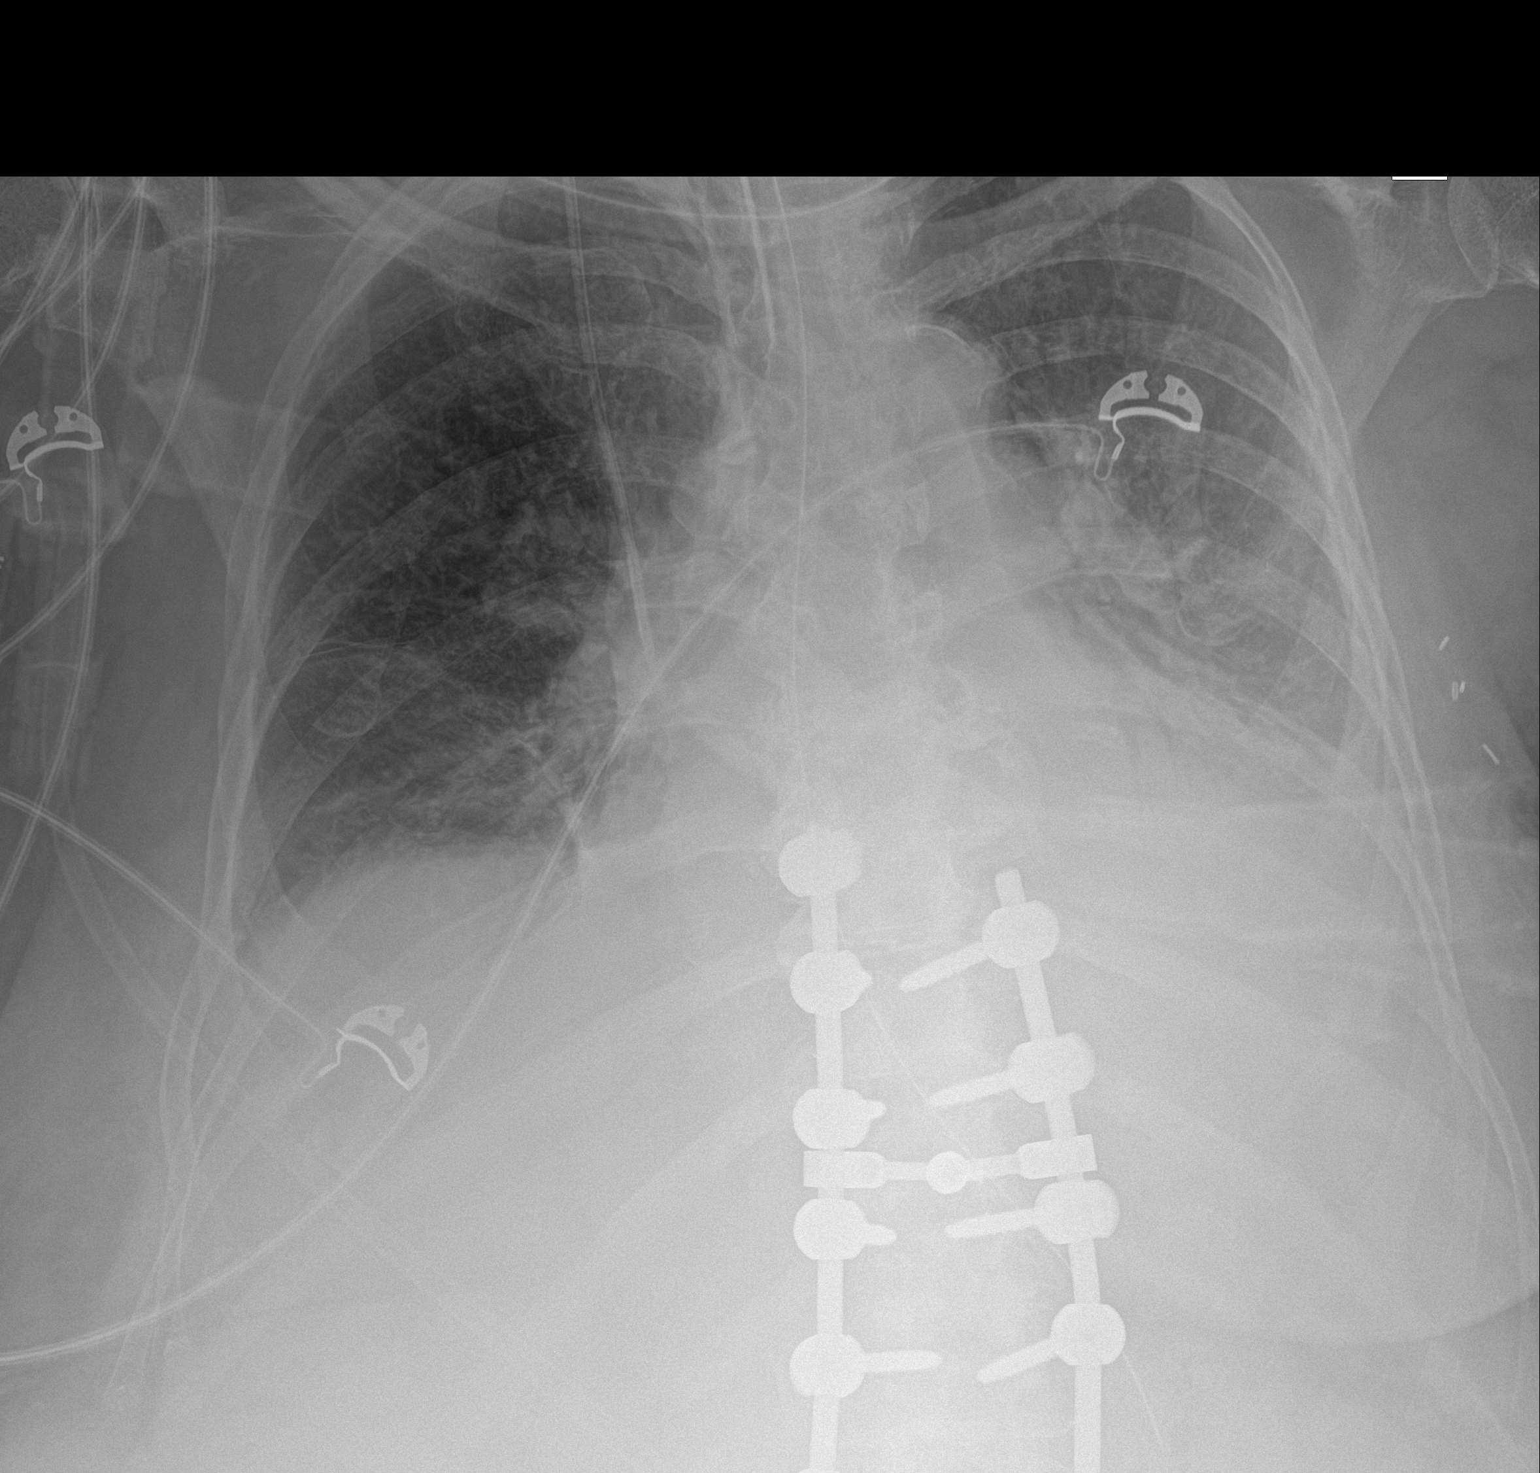

[1 of 1 positions shown; findings below may reference images not displayed]

FINDINGS: Endotracheal tube terminates 3 cm above the carina. Enteric tube
courses into the stomach. Right jugular catheter terminates over the
lower SVC. The cardiomediastinal silhouette is unchanged. Left
basilar consolidation has mildly improved. There is a persistent
small left pleural effusion. Mild atelectasis is noted in the right
lung base. No pneumothorax is identified.
IMPRESSION: Mildly improved left basilar aeration. Persistent small left pleural
effusion.

## 2019-07-16 ENCOUNTER — Other Ambulatory Visit: Payer: Self-pay

## 2019-07-16 ENCOUNTER — Ambulatory Visit (INDEPENDENT_AMBULATORY_CARE_PROVIDER_SITE_OTHER): Payer: Federal, State, Local not specified - PPO | Admitting: Cardiology

## 2019-07-16 ENCOUNTER — Encounter: Payer: Self-pay | Admitting: Cardiology

## 2019-07-16 VITALS — BP 112/60 | HR 110 | Ht 59.0 in | Wt 156.0 lb

## 2019-07-16 DIAGNOSIS — C50011 Malignant neoplasm of nipple and areola, right female breast: Secondary | ICD-10-CM

## 2019-07-16 DIAGNOSIS — D631 Anemia in chronic kidney disease: Secondary | ICD-10-CM

## 2019-07-16 DIAGNOSIS — I10 Essential (primary) hypertension: Secondary | ICD-10-CM | POA: Diagnosis not present

## 2019-07-16 DIAGNOSIS — R0600 Dyspnea, unspecified: Secondary | ICD-10-CM | POA: Diagnosis not present

## 2019-07-16 DIAGNOSIS — I420 Dilated cardiomyopathy: Secondary | ICD-10-CM

## 2019-07-16 DIAGNOSIS — I480 Paroxysmal atrial fibrillation: Secondary | ICD-10-CM | POA: Diagnosis not present

## 2019-07-16 DIAGNOSIS — J9690 Respiratory failure, unspecified, unspecified whether with hypoxia or hypercapnia: Secondary | ICD-10-CM

## 2019-07-16 DIAGNOSIS — N186 End stage renal disease: Secondary | ICD-10-CM

## 2019-07-16 DIAGNOSIS — J410 Simple chronic bronchitis: Secondary | ICD-10-CM

## 2019-07-16 DIAGNOSIS — R0609 Other forms of dyspnea: Secondary | ICD-10-CM

## 2019-07-16 HISTORY — DX: Dilated cardiomyopathy: I42.0

## 2019-07-16 HISTORY — DX: Anemia in chronic kidney disease: D63.1

## 2019-07-16 HISTORY — DX: End stage renal disease: N18.6

## 2019-07-16 NOTE — Progress Notes (Signed)
Cardiology Office Note:    Date:  07/16/2019   ID:  [name], DOB 03/02/1956, MRN [id number]  PCP:  Enid Skeens., MD  Cardiologist:  Jenne Campus, MD    Referring MD: Enid Skeens., MD   No chief complaint on file.   History of Present Illness:    [name] is a 64 y.o. female    Past medical history significant for paroxysmal atrial fibrillation, cardiomyopathy with mildly diminished left ventricular ejection fraction.  Recently she ended up going to the hospital because of septic shock.  She got urinary tract infection eventually required intubation was transferred to [facility] manage appropriately and recovered quite completely.  She is back at home denies having any palpitation she check her heart rate on the regular basis see numbers between 80 and 100.  I did review EKGs while she was in the hospital and there was no evidence of atrial fibrillation. About 2 months ago she was found to have significant hyperkalemia and her Delene Loll has been discontinued.  Now she comes today to me complaining of exertional shortness of breath.  There is no swelling of lower extremities.  There is no proximal nocturnal dyspnea.  No palpitations no dizziness.  She does have devised a telemetry to monitor EKG and she check her EKG from time to time never she recorded atrial fibrillation.  Past Medical History:  Diagnosis Date  . Arthritis    "back, knees, arms, wrists" (05/15/2013)  . Asthma   . Breast cancer ([place])   . CHF (congestive heart failure) ([place])    "mild" (05/15/2013)  . Chronic bronchitis ([place])   . Chronic lower back pain   . Dysrhythmia    atrial fib/dr [facility]  . Family history of anesthesia complication    "my mother also had PONV" (05/15/2013)  . GERD (gastroesophageal reflux disease)   . Heart murmur   . High cholesterol   . Kidney stones   . Migraine    "haven't had one in the early 2000's" (05/15/2013)  . Personal history of  chemotherapy   . Personal history of radiation therapy   . Pneumonia    "used to be chronic; last time I had it was 2013" (05/15/2013)  . PONV (postoperative nausea and vomiting)   . Recurrent UTI (urinary tract infection)    "from the continuous kidney stones; take Macrodantin qd" (05/15/2013)  . Sepsis ([place]) 05   kidney stone infection  . Tension headache   . Type II diabetes mellitus ([place])     Past Surgical History:  Procedure Laterality Date  . 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 BREAST BIOPSY Left 02/2001  . BREAST LUMPECTOMY Left 02/2001  . BREAST LUMPECTOMY Left 09/23/2013   Procedure: LEFT LUMPECTOMY WITH SPECIMEN MAMMOGRAM;  Surgeon: Adin Hector, MD;  Location: [facility];  Service: General;  Laterality: Left;  . COLONOSCOPY    . DIAGNOSTIC LAPAROSCOPY     cyst-near ovary  . LITHOTRIPSY     "2-3 times prior to 2002" (05/15/2013)  . SPINAL FUSION N/A 05/08/2013   Procedure: T9-S1 INSTRUMENTED FUSION T12 -S1 DECOMPRESSION;  Surgeon: Melina Schools, MD;  Location: [facility];  Service: Orthopedics;  Laterality: N/A;  . TUBAL LIGATION Bilateral 1985    Current Medications: Current Meds  Medication Sig  . acetaminophen (TYLENOL) 500 MG tablet Take 500 mg every 6 (six)  hours as needed by mouth.  Marland Kitchen albuterol (PROVENTIL HFA;VENTOLIN HFA) 108 (90 Base) MCG/ACT inhaler Inhale 2 puffs into the lungs as needed for wheezing or shortness of breath.  . ALPRAZolam (XANAX) 0.25 MG tablet Take 0.25 mg by mouth 3 (three) times daily as needed for anxiety.   Marland Kitchen atorvastatin (LIPITOR) 10 MG tablet Take 10 mg by mouth daily at 6 PM.   . carvedilol (COREG) 6.25 MG tablet Take 6.25 mg by mouth 2 (two) times daily with a meal.   . ELIQUIS 5 MG TABS tablet Take 1 tablet (5 mg total) by mouth 2 (two) times daily.  . flecainide (TAMBOCOR) 50 MG tablet Take 1 tablet (50 mg total) by mouth 2 (two)  times daily.  . fluticasone (FLONASE) 50 MCG/ACT nasal spray PLACE 1 SPRAY INTO BOTH NOSTRILS 2 (TWO) TIMES DAILY.  . furosemide (LASIX) 20 MG tablet Take 20 mg by mouth daily.  Marland Kitchen glipiZIDE (GLUCOTROL XL) 10 MG 24 hr tablet Take 10 mg by mouth daily.  Marland Kitchen glucagon (GLUCAGON EMERGENCY) 1 MG injection Inject 1 mg into the muscle as needed.  . metFORMIN (GLUCOPHAGE) 1000 MG tablet Take 1,000 mg by mouth 2 (two) times daily with a meal.  . montelukast (SINGULAIR) 10 MG tablet TAKE 1 TABLET BY MOUTH EVERYDAY AT BEDTIME  . omeprazole (PRILOSEC) 20 MG capsule Take 20 mg by mouth daily.   Marland Kitchen oxyCODONE-acetaminophen (PERCOCET/ROXICET) 5-325 MG tablet Take 1 tablet by mouth every 6 (six) hours as needed.  . sertraline (ZOLOFT) 50 MG tablet Take 50 mg by mouth daily as needed (anxiety).   Marland Kitchen sulfamethoxazole-trimethoprim (BACTRIM DS) 800-160 MG tablet TAKE 1 TABLET BY MOUTH TWICE A DAY FOR 7 DAYS,THEN 1 TABLET A DAY     Allergies:   Ativan [lorazepam], Other, Cleocin [clindamycin hcl], Morphine and related, Pneumococcal vaccines, and Buprenorphine hcl   Social History   Socioeconomic History  . Marital status: Married    Spouse name: Not on file  . Number of children: Not on file  . Years of education: Not on file  . Highest education level: Not on file  Occupational History  . Not on file  Tobacco Use  . Smoking status: Passive Smoke Exposure - Never Smoker  . Smokeless tobacco: Never Used  . Tobacco comment: Mother & Father smoked  Substance and Sexual Activity  . Alcohol use: No    Alcohol/week: 0.0 standard drinks  . Drug use: No  . Sexual activity: Yes  Other Topics Concern  . Not on file  Social History Narrative   [facility] (11/15/16):   Patient's originally from [place]. Has always lived in [place]. Currently has an outside dog. Remote exposure to [place] when her children were young. No known mold exposure. 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    Social Determinants of Health   Financial Resource Strain:   . Difficulty of Paying Living Expenses: Not on file  Food Insecurity:   . Worried About Charity fundraiser in the Last Year: Not on file  . Ran Out of Food in the Last Year: Not on file  Transportation Needs:   . Lack of Transportation (Medical): Not on file  . Lack of Transportation (Non-Medical): Not on file  Physical Activity:   . Days of Exercise per Week: Not on file  . Minutes of Exercise per Session: Not on file  Stress:   . Feeling of Stress : Not on file  Social Connections:   . Frequency of Communication with Friends and Family: Not on file  . Frequency of Social Gatherings with Friends and Family: Not on file  . Attends Religious Services: Not on file  . Active Member of Clubs or Organizations: Not on file  . Attends Archivist Meetings: Not on file  . Marital Status: Not on file     Family History: The patient's family history includes Breast cancer in her mother; COPD in her mother; Environmental Allergies in her son and another family member; Heart disease in her mother; Kidney cancer in her father; Liver cancer in her brother; Lupus in an other family member. ROS:   Please see the history of present illness.    All 14 point review of systems negative except as described per history of present illness  EKGs/Labs/Other Studies Reviewed:      Recent Labs: 10/01/2018: B Natriuretic Peptide 2,948.4 10/04/2018: Magnesium 1.6 06/26/2019: ALT 21; BUN 47; Creatinine 1.64; Hemoglobin 12.5; Platelet Count 266; Potassium 6.0; Sodium 139  Recent Lipid Panel    Component Value Date/Time   TRIG 155 (H) 05/08/2013 2023    Physical Exam:    VS:  BP 112/60   Pulse (!) 110   Ht 4\' 11"  (1.499 m)   Wt 156 lb (70.8 kg)   SpO2 96%   BMI 31.51 kg/m     Wt Readings from Last 3 Encounters:  07/16/19 156 lb (70.8 kg)  06/26/19 154 lb 0.7 oz (69.9 kg)  05/21/19 153 lb  1.9 oz (69.5 kg)     GEN:  Well nourished, well developed in no acute distress HEENT: Normal NECK: No JVD; No carotid bruits LYMPHATICS: No lymphadenopathy CARDIAC: RRR, no murmurs, no rubs, no gallops RESPIRATORY:  Clear to auscultation without rales, wheezing or rhonchi  ABDOMEN: Soft, non-tender, non-distended MUSCULOSKELETAL:  No edema; No deformity  SKIN: Warm and dry LOWER EXTREMITIES: no swelling NEUROLOGIC:  Alert and oriented x 3 PSYCHIATRIC:  Normal affect   ASSESSMENT:    1. PAF (paroxysmal atrial fibrillation) ([place])   2. Essential hypertension   3. Dilated cardiomyopathy ([place])   4. DOE (dyspnea on exertion)    PLAN:    In order of problems listed above:  1. Paroxysmal atrial fibrillation seems to maintaining sinus rhythm.  Continue anticoagulation with Eliquis 2. Essential hypertension blood pressure poorly controlled continue present management. 3. History of dilated cardiomyopathy with discontinuation of Entresto about 2 months ago.  She does have progressive shortness of breath and must check her heart make sure his ejection fraction still maintain.  Echocardiogram will be scheduled this week or next week. 4. Dyspnea on exertion of course concern is about cardiomyopathy getting worse.   Medication Adjustments/Labs and Tests Ordered: Current medicines are reviewed at length with the patient today.  Concerns regarding medicines are outlined above.  No orders of the defined types were placed in this encounter.  Medication changes: No orders of the defined types were placed in this encounter.   Signed, Park Liter, MD, [facility] 07/16/2019 4:30 PM    [place]

## 2019-07-16 NOTE — Patient Instructions (Signed)
Medication Instructions:  Your physician recommends that you continue on your current medications as directed. Please refer to the Current Medication list given to you today.  *If you need a refill on your cardiac medications before your next appointment, please call your pharmacy*  Lab Work: None.  If you have labs (blood work) drawn today and your tests are completely normal, you will receive your results only by: Marland Kitchen MyChart Message (if you have MyChart) OR . A paper copy in the mail If you have any lab test that is abnormal or we need to change your treatment, we will call you to review the results.  Testing/Procedures: Your physician has requested that you have an echocardiogram. Echocardiography is a painless test that uses sound waves to create images of your heart. It provides your doctor with information about the size and shape of your heart and how well your heart's chambers and valves are working. This procedure takes approximately one hour. There are no restrictions for this procedure.    Follow-Up: At [facility], you and your health needs are our priority.  As part of our continuing mission to provide you with exceptional heart care, we have created designated Provider Care Teams.  These Care Teams include your primary Cardiologist (physician) and Advanced Practice Providers (APPs -  Physician Assistants and Nurse Practitioners) who all work together to provide you with the care you need, when you need it.  Your next appointment:   1 month(s)  The format for your next appointment:   In Person  Provider:   You will see Dr. Agustin Cree.  Or, you can be scheduled with the following Advanced Practice Provider on your designated Care Team (at our [facility]):  Laurann Montana, FNP    Other Instructions   Echocardiogram An echocardiogram is a procedure that uses painless sound waves (ultrasound) to produce an image of the heart. Images from an echocardiogram can  provide important information about:  Signs of coronary artery disease (CAD).  Aneurysm detection. An aneurysm is a weak or damaged part of an artery wall that bulges out from the normal force of blood pumping through the body.  Heart size and shape. Changes in the size or shape of the heart can be associated with certain conditions, including heart failure, aneurysm, and CAD.  Heart muscle function.  Heart valve function.  Signs of a past heart attack.  Fluid buildup around the heart.  Thickening of the heart muscle.  A tumor or infectious growth around the heart valves. Tell a health care provider about:  Any allergies you have.  All medicines you are taking, including vitamins, herbs, eye drops, creams, and over-the-counter medicines.  Any blood disorders you have.  Any surgeries you have had.  Any medical conditions you have.  Whether you are pregnant or may be pregnant. What are the risks? Generally, this is a safe procedure. However, problems may occur, including:  Allergic reaction to dye (contrast) that may be used during the procedure. What happens before the procedure? No specific preparation is needed. You may eat and drink normally. What happens during the procedure?   An IV tube may be inserted into one of your veins.  You may receive contrast through this tube. A contrast is an injection that improves the quality of the pictures from your heart.  A gel will be applied to your chest.  A wand-like tool (transducer) will be moved over your chest. The gel will help to transmit the sound waves  from the transducer.  The sound waves will harmlessly bounce off of your heart to allow the heart images to be captured in real-time motion. The images will be recorded on a computer. The procedure may vary among health care providers and hospitals. What happens after the procedure?  You may return to your normal, everyday life, including diet, activities, and  medicines, unless your health care provider tells you not to do that. Summary  An echocardiogram is a procedure that uses painless sound waves (ultrasound) to produce an image of the heart.  Images from an echocardiogram can provide important information about the size and shape of your heart, heart muscle function, heart valve function, and fluid buildup around your heart.  You do not need to do anything to prepare before this procedure. You may eat and drink normally.  After the echocardiogram is completed, you may return to your normal, everyday life, unless your health care provider tells you not to do that. This information is not intended to replace advice given to you by your health care provider. Make sure you discuss any questions you have with your health care provider. Document Revised: 10/11/2018 Document Reviewed: 07/23/2016 Elsevier Patient Education  [place].

## 2019-07-18 ENCOUNTER — Other Ambulatory Visit (HOSPITAL_BASED_OUTPATIENT_CLINIC_OR_DEPARTMENT_OTHER): Payer: Federal, State, Local not specified - PPO

## 2019-07-19 ENCOUNTER — Ambulatory Visit (HOSPITAL_BASED_OUTPATIENT_CLINIC_OR_DEPARTMENT_OTHER)
Admission: RE | Admit: 2019-07-19 | Discharge: 2019-07-19 | Disposition: A | Discharge status: Home / Self Care | Payer: Federal, State, Local not specified - PPO | Source: Ambulatory Visit | Attending: Cardiology | Admitting: Cardiology

## 2019-07-19 ENCOUNTER — Other Ambulatory Visit: Payer: Self-pay

## 2019-07-19 DIAGNOSIS — I480 Paroxysmal atrial fibrillation: Secondary | ICD-10-CM | POA: Insufficient documentation

## 2019-07-19 NOTE — Progress Notes (Signed)
  Echocardiogram 2D Echocardiogram has been performed.  Cardell Peach 07/19/2019, 10:39 AM

## 2019-07-24 ENCOUNTER — Inpatient Hospital Stay: Payer: Federal, State, Local not specified - PPO | Admitting: Hematology & Oncology

## 2019-07-24 ENCOUNTER — Inpatient Hospital Stay: Payer: Federal, State, Local not specified - PPO

## 2019-07-31 DIAGNOSIS — IMO0001 Reserved for inherently not codable concepts without codable children: Secondary | ICD-10-CM

## 2019-07-31 HISTORY — DX: Reserved for inherently not codable concepts without codable children: IMO0001

## 2019-08-16 ENCOUNTER — Other Ambulatory Visit: Payer: Self-pay

## 2019-08-16 ENCOUNTER — Ambulatory Visit (INDEPENDENT_AMBULATORY_CARE_PROVIDER_SITE_OTHER): Payer: Federal, State, Local not specified - PPO | Admitting: Cardiology

## 2019-08-16 ENCOUNTER — Encounter: Payer: Self-pay | Admitting: Cardiology

## 2019-08-16 VITALS — BP 100/58 | HR 73 | Ht 59.0 in | Wt 158.0 lb

## 2019-08-16 DIAGNOSIS — E782 Mixed hyperlipidemia: Secondary | ICD-10-CM | POA: Diagnosis not present

## 2019-08-16 DIAGNOSIS — I480 Paroxysmal atrial fibrillation: Secondary | ICD-10-CM | POA: Diagnosis not present

## 2019-08-16 DIAGNOSIS — I420 Dilated cardiomyopathy: Secondary | ICD-10-CM

## 2019-08-16 NOTE — Patient Instructions (Signed)
Medication Instructions:  Your physician recommends that you continue on your current medications as directed. Please refer to the Current Medication list given to you today.  *If you need a refill on your cardiac medications before your next appointment, please call your pharmacy*  Lab Work: None If you have labs (blood work) drawn today and your tests are completely normal, you will receive your results only by: Marland Kitchen MyChart Message (if you have MyChart) OR . A paper copy in the mail If you have any lab test that is abnormal or we need to change your treatment, we will call you to review the results.  Testing/Procedures: Your physician has requested that you have an echocardiogram. Echocardiography is a painless test that uses sound waves to create images of your heart. It provides your doctor with information about the size and shape of your heart and how well your heart's chambers and valves are working. This procedure takes approximately one hour. There are no restrictions for this procedure.  IN 5 MONTHS  Follow-Up: At [facility], you and your health needs are our priority.  As part of our continuing mission to provide you with exceptional heart care, we have created designated Provider Care Teams.  These Care Teams include your primary Cardiologist (physician) and Advanced Practice Providers (APPs -  Physician Assistants and Nurse Practitioners) who all work together to provide you with the care you need, when you need it.  Your next appointment:   5 month(s)  The format for your next appointment:   In Person  Provider:   Jenne Campus, MD  Other Instructions

## 2019-08-16 NOTE — Progress Notes (Signed)
Cardiology Office Note:    Date:  08/16/2019   ID:  [name], DOB 06/14/1956, MRN [id number]  PCP:  Enid Skeens., MD  Cardiologist:  Jenne Campus, MD    Referring MD: Enid Skeens., MD   Chief Complaint  Patient presents with  . Follow-up    History of Present Illness:    [name] is a 64 y.o. female with past medical history significant for paroxysmal atrial fibrillation, also cardiomyopathy with normalization of left ventricular ejection fraction, history of breast cancer.  Comes today 2 months for follow-up.  While ago she had of having sepsis and being in the hospital had rough time recovery but now seems to be recovered completely.  Recently I did echocardiogram because of shortness of breath.  Echocardiogram showed preserved left ventricle ejection fraction.  Overall she is doing well.  Denies have any palpitations does have some fatigue and tiredness but otherwise doing well  Past Medical History:  Diagnosis Date  . Arthritis    "back, knees, arms, wrists" (05/15/2013)  . Asthma   . Breast cancer ([place])   . CHF (congestive heart failure) ([place])    "mild" (05/15/2013)  . Chronic bronchitis ([place])   . Chronic lower back pain   . Dysrhythmia    atrial fib/dr [facility]  . Family history of anesthesia complication    "my mother also had PONV" (05/15/2013)  . GERD (gastroesophageal reflux disease)   . Heart murmur   . High cholesterol   . Kidney stones   . Migraine    "haven't had one in the early 2000's" (05/15/2013)  . Personal history of chemotherapy   . Personal history of radiation therapy   . Pneumonia    "used to be chronic; last time I had it was 2013" (05/15/2013)  . PONV (postoperative nausea and vomiting)   . Recurrent UTI (urinary tract infection)    "from the continuous kidney stones; take Macrodantin qd" (05/15/2013)  . Sepsis ([place]) 05   kidney stone infection  . Tension headache   . Type II diabetes mellitus  ([place])     Past Surgical History:  Procedure Laterality Date  . 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 BREAST BIOPSY Left 02/2001  . BREAST LUMPECTOMY Left 02/2001  . BREAST LUMPECTOMY Left 09/23/2013   Procedure: LEFT LUMPECTOMY WITH SPECIMEN MAMMOGRAM;  Surgeon: Adin Hector, MD;  Location: [facility];  Service: General;  Laterality: Left;  . COLONOSCOPY    . DIAGNOSTIC LAPAROSCOPY     cyst-near ovary  . LITHOTRIPSY     "2-3 times prior to 2002" (05/15/2013)  . SPINAL FUSION N/A 05/08/2013   Procedure: T9-S1 INSTRUMENTED FUSION T12 -S1 DECOMPRESSION;  Surgeon: Melina Schools, MD;  Location: [facility];  Service: Orthopedics;  Laterality: N/A;  . TUBAL LIGATION Bilateral 1985    Current Medications: Current Meds  Medication Sig  . acetaminophen (TYLENOL) 500 MG tablet Take 500 mg every 6 (six) hours as needed by mouth.  Marland Kitchen albuterol (PROVENTIL HFA;VENTOLIN HFA) 108 (90 Base) MCG/ACT inhaler Inhale 2 puffs into the lungs as needed for wheezing or shortness of breath.  . ALPRAZolam (XANAX) 0.25 MG tablet Take 0.25 mg by mouth 3 (three) times daily as needed for anxiety.   Marland Kitchen atorvastatin (LIPITOR) 10 MG tablet Take 10 mg by mouth daily at 6 PM.   . carvedilol (COREG) 6.25  MG tablet Take 6.25 mg by mouth 2 (two) times daily with a meal.   . ELIQUIS 5 MG TABS tablet Take 1 tablet (5 mg total) by mouth 2 (two) times daily.  . flecainide (TAMBOCOR) 50 MG tablet Take 1 tablet (50 mg total) by mouth 2 (two) times daily.  . fluticasone (FLONASE) 50 MCG/ACT nasal spray PLACE 1 SPRAY INTO BOTH NOSTRILS 2 (TWO) TIMES DAILY.  . furosemide (LASIX) 20 MG tablet Take 20 mg by mouth daily.  Marland Kitchen glipiZIDE (GLUCOTROL XL) 10 MG 24 hr tablet Take 10 mg by mouth daily.  Marland Kitchen glucagon (GLUCAGON EMERGENCY) 1 MG injection Inject 1 mg into the muscle as needed.  . loratadine (CLARITIN) 10 MG tablet Take 10 mg by mouth  daily as needed for allergies.  . metFORMIN (GLUCOPHAGE) 1000 MG tablet Take 1,000 mg by mouth 2 (two) times daily with a meal.  . montelukast (SINGULAIR) 10 MG tablet TAKE 1 TABLET BY MOUTH EVERYDAY AT BEDTIME  . omeprazole (PRILOSEC) 20 MG capsule Take 20 mg by mouth daily.   Marland Kitchen oxybutynin (DITROPAN-XL) 10 MG 24 hr tablet Take 10 mg by mouth daily.     Allergies:   Ativan [lorazepam], Other, Cleocin [clindamycin hcl], Morphine and related, Pneumococcal vaccines, and Buprenorphine hcl   Social History   Socioeconomic History  . Marital status: Married    Spouse name: Not on file  . Number of children: Not on file  . Years of education: Not on file  . Highest education level: Not on file  Occupational History  . Not on file  Tobacco Use  . Smoking status: Passive Smoke Exposure - Never Smoker  . Smokeless tobacco: Never Used  . Tobacco comment: Mother & Father smoked  Substance and Sexual Activity  . Alcohol use: No    Alcohol/week: 0.0 standard drinks  . Drug use: No  . Sexual activity: Yes  Other Topics Concern  . Not on file  Social History Narrative   [facility] (11/15/16):   Patient's originally from [place]. Has always lived in [place]. Currently has an outside dog. Remote exposure to [place] when her children were young. No known mold exposure. 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    Social Determinants of Health   Financial Resource Strain:   . Difficulty of Paying Living Expenses: Not on file  Food Insecurity:   . Worried About Charity fundraiser in the Last Year: Not on file  . Ran Out of Food in the Last Year: Not on file  Transportation Needs:   . Lack of Transportation (Medical): Not on file  . Lack of Transportation (Non-Medical): Not on file  Physical Activity:   . Days of Exercise per Week: Not on file  . Minutes of Exercise per Session: Not on file  Stress:   . Feeling of Stress : Not on  file  Social Connections:   . Frequency of Communication with Friends and Family: Not on file  . Frequency of Social Gatherings with Friends and Family: Not on file  . Attends Religious Services: Not on file  . Active Member of Clubs or Organizations: Not on file  . Attends Archivist Meetings: Not on file  . Marital Status: Not on file     Family History: The patient's family history includes Breast cancer in her mother; COPD in her mother; Environmental Allergies in her son and another family member; Heart disease in her mother; Kidney  cancer in her father; Liver cancer in her brother; Lupus in an other family member. ROS:   Please see the history of present illness.    All 14 point review of systems negative except as described per history of present illness  EKGs/Labs/Other Studies Reviewed:      Recent Labs: 10/01/2018: B Natriuretic Peptide 2,948.4 10/04/2018: Magnesium 1.6 06/26/2019: ALT 21; BUN 47; Creatinine 1.64; Hemoglobin 12.5; Platelet Count 266; Potassium 6.0; Sodium 139  Recent Lipid Panel    Component Value Date/Time   TRIG 155 (H) 05/08/2013 2023    Physical Exam:    VS:  BP (!) 100/58 (BP Location: Right Arm, Patient Position: Sitting, Cuff Size: Normal)   Pulse 73   Ht 4\' 11"  (1.499 m)   Wt 158 lb (71.7 kg)   SpO2 98%   BMI 31.91 kg/m     Wt Readings from Last 3 Encounters:  08/16/19 158 lb (71.7 kg)  07/16/19 156 lb (70.8 kg)  06/26/19 154 lb 0.7 oz (69.9 kg)     GEN:  Well nourished, well developed in no acute distress HEENT: Normal NECK: No JVD; No carotid bruits LYMPHATICS: No lymphadenopathy CARDIAC: RRR, no murmurs, no rubs, no gallops RESPIRATORY:  Clear to auscultation without rales, wheezing or rhonchi  ABDOMEN: Soft, non-tender, non-distended MUSCULOSKELETAL:  No edema; No deformity  SKIN: Warm and dry LOWER EXTREMITIES: no swelling NEUROLOGIC:  Alert and oriented x 3 PSYCHIATRIC:  Normal affect   ASSESSMENT:    1. PAF  (paroxysmal atrial fibrillation) ([place])   2. Dilated cardiomyopathy ([place])   3. Mixed hyperlipidemia    PLAN:    In order of problems listed above:  1. Paroxysmal atrial fibrillation anticoagulated which I will continue.  She is on Tambocor. 2. Dilated cardiomyopathy echocardiogram repeated normal left ventricular ejection fraction.  She used to be on Entresto however that was discontinued because of hyperkalemia.  Now her ejection fraction is normal there is no indication to restart this medication at this moment.  However, we need to be very careful and I need to schedule her to have another echocardiogram in about 5 to 6 months to make sure there is no deterioration of left ventricle ejection fraction.  If that is the case then we will put her back on Entresto. 3. History of hyperlipidemia.  She is on Lipitor 10 which I will continue.  Will call primary care physician to get fasting lipid profile   Medication Adjustments/Labs and Tests Ordered: Current medicines are reviewed at length with the patient today.  Concerns regarding medicines are outlined above.  Orders Placed This Encounter  Procedures  . ECHOCARDIOGRAM COMPLETE   Medication changes: No orders of the defined types were placed in this encounter.   Signed, Park Liter, MD, [facility] 08/16/2019 11:11 AM    [place]

## 2019-08-19 ENCOUNTER — Inpatient Hospital Stay: Payer: Federal, State, Local not specified - PPO | Admitting: Hematology & Oncology

## 2019-08-19 ENCOUNTER — Inpatient Hospital Stay: Payer: Federal, State, Local not specified - PPO

## 2019-08-30 ENCOUNTER — Encounter: Payer: Self-pay | Admitting: Hematology & Oncology

## 2019-08-30 ENCOUNTER — Inpatient Hospital Stay (HOSPITAL_BASED_OUTPATIENT_CLINIC_OR_DEPARTMENT_OTHER): Payer: Federal, State, Local not specified - PPO | Admitting: Hematology & Oncology

## 2019-08-30 ENCOUNTER — Telehealth: Payer: Self-pay | Admitting: Hematology & Oncology

## 2019-08-30 ENCOUNTER — Other Ambulatory Visit: Payer: Self-pay

## 2019-08-30 ENCOUNTER — Inpatient Hospital Stay: Payer: Federal, State, Local not specified - PPO | Attending: Hematology & Oncology

## 2019-08-30 ENCOUNTER — Other Ambulatory Visit: Payer: Self-pay | Admitting: Internal Medicine

## 2019-08-30 ENCOUNTER — Inpatient Hospital Stay: Payer: Federal, State, Local not specified - PPO

## 2019-08-30 VITALS — BP 106/66 | HR 76 | Temp 97.3°F | Resp 20 | Wt 157.0 lb

## 2019-08-30 DIAGNOSIS — Z853 Personal history of malignant neoplasm of breast: Secondary | ICD-10-CM | POA: Insufficient documentation

## 2019-08-30 DIAGNOSIS — D500 Iron deficiency anemia secondary to blood loss (chronic): Secondary | ICD-10-CM | POA: Diagnosis not present

## 2019-08-30 DIAGNOSIS — D509 Iron deficiency anemia, unspecified: Secondary | ICD-10-CM | POA: Diagnosis not present

## 2019-08-30 DIAGNOSIS — D649 Anemia, unspecified: Secondary | ICD-10-CM

## 2019-08-30 LAB — CMP (CANCER CENTER ONLY)
ALT: 26 U/L (ref 0–44)
AST: 17 U/L (ref 15–41)
Albumin: 4.9 g/dL (ref 3.5–5.0)
Alkaline Phosphatase: 67 U/L (ref 38–126)
Anion gap: 12 (ref 5–15)
BUN: 46 mg/dL — ABNORMAL HIGH (ref 8–23)
CO2: 21 mmol/L — ABNORMAL LOW (ref 22–32)
Calcium: 9.9 mg/dL (ref 8.9–10.3)
Chloride: 107 mmol/L (ref 98–111)
Creatinine: 1.62 mg/dL — ABNORMAL HIGH (ref 0.44–1.00)
GFR, Est AFR Am: 38 mL/min — ABNORMAL LOW (ref >60)
GFR, Estimated: 33 mL/min — ABNORMAL LOW (ref >60)
Glucose, Bld: 138 mg/dL — ABNORMAL HIGH (ref 70–99)
Potassium: 4.3 mmol/L (ref 3.5–5.1)
Sodium: 140 mmol/L (ref 135–145)
Total Bilirubin: 0.3 mg/dL (ref 0.3–1.2)
Total Protein: 7.8 g/dL (ref 6.5–8.1)

## 2019-08-30 LAB — IRON AND TIBC
Iron: 51 ug/dL (ref 41–142)
Saturation Ratios: 15 % — ABNORMAL LOW (ref 21–57)
TIBC: 339 ug/dL (ref 236–444)
UIBC: 288 ug/dL (ref 120–384)

## 2019-08-30 LAB — CBC WITH DIFFERENTIAL (CANCER CENTER ONLY)
Abs Immature Granulocytes: 0.03 10*3/uL (ref 0.00–0.07)
Basophils Absolute: 0.1 10*3/uL (ref 0.0–0.1)
Basophils Relative: 1 %
Eosinophils Absolute: 0.2 10*3/uL (ref 0.0–0.5)
Eosinophils Relative: 2 %
HCT: 34.6 % — ABNORMAL LOW (ref 36.0–46.0)
Hemoglobin: 11 g/dL — ABNORMAL LOW (ref 12.0–15.0)
Immature Granulocytes: 0 %
Lymphocytes Relative: 24 %
Lymphs Abs: 2.4 10*3/uL (ref 0.7–4.0)
MCH: 31.7 pg (ref 26.0–34.0)
MCHC: 31.8 g/dL (ref 30.0–36.0)
MCV: 99.7 fL (ref 80.0–100.0)
Monocytes Absolute: 0.4 10*3/uL (ref 0.1–1.0)
Monocytes Relative: 4 %
Neutro Abs: 6.8 10*3/uL (ref 1.7–7.7)
Neutrophils Relative %: 69 %
Platelet Count: 263 10*3/uL (ref 150–400)
RBC: 3.47 MIL/uL — ABNORMAL LOW (ref 3.87–5.11)
RDW: 14.6 % (ref 11.5–15.5)
WBC Count: 10 10*3/uL (ref 4.0–10.5)
nRBC: 0 % (ref 0.0–0.2)

## 2019-08-30 LAB — FERRITIN: Ferritin: 98 ng/mL (ref 11–307)

## 2019-08-30 NOTE — Progress Notes (Signed)
Hematology and Oncology Follow Up Visit  CLESSIE KARRAS [id number] July 11, 1955 64 y.o. 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 Mcaulay is here today for follow-up.  We last saw her right before Christmas.  She had a very nice Christmas and New Year's.  She and her husband went up to [place], [place] for a weekend and had a very nice time.  She is now seeing a nephrologist.  Her potassium is back to normal.  She has had no problems with bleeding.  There is been no issues with fever.  She has had no cough.  There is no nausea or vomiting.  We last saw her back in December, her ferritin was 81 with an iron saturation of 35%.  Currently, her performance status is ECOG 1.  Medications:  Allergies as of 08/30/2019      Reactions   Ativan [lorazepam]    Other Nausea And Vomiting, Swelling   Shrimp if eat a lot   Cleocin [clindamycin Hcl] Other (See Comments)   "irritated my esophagus"   Morphine And Related Nausea And Vomiting   Pneumococcal Vaccines Other (See Comments)   Really high fever, and flu symptoms. MD instructed not to give   Buprenorphine Hcl Nausea And Vomiting      Medication List       Accurate as of August 30, 2019  9:01 AM. If you have any questions, ask your nurse or doctor.        acetaminophen 500 MG tablet Commonly known as: TYLENOL Take 500 mg every 6 (six) hours as needed by mouth.   albuterol 108 (90 Base) MCG/ACT inhaler Commonly known as: VENTOLIN HFA Inhale 2 puffs into the lungs as needed for wheezing or shortness of breath.   ALPRAZolam 0.25 MG tablet Commonly known as: XANAX Take 0.25 mg by mouth 3 (three) times daily as needed for anxiety.   atorvastatin 10 MG tablet Commonly  known as: LIPITOR Take 10 mg by mouth daily at 6 PM.   carvedilol 6.25 MG tablet Commonly known as: COREG Take 6.25 mg by mouth 2 (two) times daily with a meal.   Eliquis 5 MG Tabs tablet Generic drug: apixaban Take 1 tablet (5 mg total) by mouth 2 (two) times daily.   flecainide 50 MG tablet Commonly known as: TAMBOCOR Take 1 tablet (50 mg total) by mouth 2 (two) times daily.   fluticasone 50 MCG/ACT nasal spray Commonly known as: FLONASE PLACE 1 SPRAY INTO BOTH NOSTRILS 2 (TWO) TIMES DAILY.   furosemide 20 MG tablet Commonly known as: LASIX Take 20 mg by mouth daily.   glipiZIDE 10 MG 24 hr tablet Commonly known as: GLUCOTROL XL Take 10 mg by mouth daily.   Glucagon Emergency 1 MG Kit Inject 1 mg into the muscle as needed.   loratadine 10 MG tablet Commonly known as: CLARITIN Take 10 mg by mouth daily as needed for allergies.   metFORMIN 1000 MG tablet Commonly known as: GLUCOPHAGE Take 1,000 mg by mouth 2 (two) times daily with a meal.   montelukast 10 MG tablet Commonly known as: SINGULAIR TAKE 1 TABLET BY MOUTH EVERYDAY AT BEDTIME   omeprazole 20 MG capsule Commonly known as: PRILOSEC Take 20 mg by mouth daily.  oxybutynin 10 MG 24 hr tablet Commonly known as: DITROPAN-XL Take 10 mg by mouth daily.       Allergies:  Allergies  Allergen Reactions  . Ativan [Lorazepam]   . Other Nausea And Vomiting and Swelling    Shrimp if eat a lot  . Cleocin [Clindamycin Hcl] Other (See Comments)    "irritated my esophagus"  . Morphine And Related Nausea And Vomiting  . Pneumococcal Vaccines Other (See Comments)    Really high fever, and flu symptoms. MD instructed not to give  . Buprenorphine Hcl Nausea And Vomiting    Past Medical History, Surgical history, Social history, and Family History were reviewed and updated.  Review of Systems: Review of Systems  Constitutional: Negative.   HENT: Negative.   Eyes: Negative.   Respiratory: Negative.     Cardiovascular: Negative.   Gastrointestinal: Negative.   Genitourinary: Negative.   Musculoskeletal: Negative.   Skin: Negative.   Neurological: Negative.   Endo/Heme/Allergies: Negative.   Psychiatric/Behavioral: Negative.      Physical Exam:  weight is 157 lb (71.2 kg). Her temporal temperature is 97.3 F (36.3 C) (abnormal). Her blood pressure is 106/66 and her pulse is 76. Her respiration is 20 and oxygen saturation is 100%.   Wt Readings from Last 3 Encounters:  08/30/19 157 lb (71.2 kg)  08/16/19 158 lb (71.7 kg)  07/16/19 156 lb (70.8 kg)   Physical Exam Vitals reviewed.  Constitutional:      Comments: Breast exam shows right breast with no masses, edema or erythema.  There is no right axillary adenopathy.  Left breast is contracted from surgery and radiation therapy.  He has a lumpectomy scar at the areola.  There is some slight firmness at the lumpectomy site.  No distinct masses noted.  There is no left axillary adenopathy.  HENT:     Head: Normocephalic and atraumatic.  Eyes:     Pupils: Pupils are equal, round, and reactive to light.  Cardiovascular:     Rate and Rhythm: Normal rate and regular rhythm.     Heart sounds: Normal heart sounds.  Pulmonary:     Effort: Pulmonary effort is normal.     Breath sounds: Normal breath sounds.  Abdominal:     General: Bowel sounds are normal.     Palpations: Abdomen is soft.  Musculoskeletal:        General: No tenderness or deformity. Normal range of motion.     Cervical back: Normal range of motion.  Lymphadenopathy:     Cervical: No cervical adenopathy.  Skin:    General: Skin is warm and dry.     Findings: No erythema or rash.  Neurological:     Mental Status: She is alert and oriented to person, place, and time.  Psychiatric:        Behavior: Behavior normal.        Thought Content: Thought content normal.        Judgment: Judgment normal.       Lab Results  Component Value Date   WBC 10.0 08/30/2019    HGB 11.0 (L) 08/30/2019   HCT 34.6 (L) 08/30/2019   MCV 99.7 08/30/2019   PLT 263 08/30/2019   Lab Results  Component Value Date   FERRITIN 81 06/26/2019   IRON 111 06/26/2019   TIBC 318 06/26/2019   UIBC 206 06/26/2019   IRONPCTSAT 35 06/26/2019   Lab Results  Component Value Date   RETICCTPCT 2.8 05/21/2019   RBC 3.47 (  L) 08/30/2019   No results found for: Nils Pyle [facility] Lab Results  Component Value Date   IGGSERUM 789 11/15/2016   IGA 150 11/15/2016   IGMSERUM 86 11/15/2016   No results found for: Odetta Pink, SPEI   Chemistry      Component Value Date/Time   NA 139 06/26/2019 1014   NA 140 05/01/2019 1536   NA 144 04/17/2017 1150   NA 141 04/18/2016 1256   K 6.0 (H) 06/26/2019 1014   K 4.6 04/17/2017 1150   K 4.4 04/18/2016 1256   CL 111 06/26/2019 1014   CL 109 (H) 04/17/2017 1150   CO2 19 (L) 06/26/2019 1014   CO2 23 04/17/2017 1150   CO2 18 (L) 04/18/2016 1256   BUN 47 (H) 06/26/2019 1014   BUN 38 (H) 05/01/2019 1536   BUN 28 (H) 04/17/2017 1150   BUN 27.3 (H) 04/18/2016 1256   CREATININE 1.64 (H) 06/26/2019 1014   CREATININE 1.3 (H) 04/17/2017 1150   CREATININE 1.4 (H) 04/18/2016 1256      Component Value Date/Time   CALCIUM 9.9 06/26/2019 1014   CALCIUM 9.6 04/17/2017 1150   CALCIUM 9.4 04/18/2016 1256   ALKPHOS 76 06/26/2019 1014   ALKPHOS 62 04/17/2017 1150   ALKPHOS 68 04/18/2016 1256   AST 17 06/26/2019 1014   AST 14 04/18/2016 1256   ALT 21 06/26/2019 1014   ALT 20 04/17/2017 1150   ALT 23 04/18/2016 1256   BILITOT 0.2 (L) 06/26/2019 1014   BILITOT <0.22 04/18/2016 1256       Impression and Plan: Ms. Hargan is a very pleasant 64 yo caucasian female with history of stage I ductal carcinoma of the left breast .  She is over 17 years out now.  I do not see any problems with respect to breast cancer.  She does not need Aranesp today.  I suspect that she  probably will need it when we see her back.  I am happy that her potassium is doing so much better.  We will get her back in 6 weeks.    [name], MD 2/26/20219:01 AM

## 2019-08-30 NOTE — Telephone Encounter (Signed)
Appointments scheduled declined calendar due to My Chart per 2/26 los

## 2019-08-30 NOTE — Telephone Encounter (Signed)
Pt requesting Singulair refill.  Pt last seen 07/2018, is overdue for an OV.  Called pt and scheduled her to see MW on 09/02/2019 at 11:45.  1 refill of Singulair sent to pharmacy.  Nothing further needed at this time- will close encounter.

## 2019-09-02 ENCOUNTER — Encounter: Payer: Self-pay | Admitting: Internal Medicine

## 2019-09-02 ENCOUNTER — Other Ambulatory Visit: Payer: Self-pay

## 2019-09-02 ENCOUNTER — Ambulatory Visit (INDEPENDENT_AMBULATORY_CARE_PROVIDER_SITE_OTHER): Payer: Federal, State, Local not specified - PPO | Admitting: Internal Medicine

## 2019-09-02 DIAGNOSIS — J45991 Cough variant asthma: Secondary | ICD-10-CM | POA: Diagnosis not present

## 2019-09-02 MED ORDER — MONTELUKAST SODIUM 10 MG PO TABS: ORAL_TABLET | ORAL | 3 refills | Status: DC

## 2019-09-02 MED ORDER — ALBUTEROL SULFATE HFA 108 (90 BASE) MCG/ACT IN AERS: 2.0000 | INHALATION_SPRAY | RESPIRATORY_TRACT | 2 refills | Status: DC | PRN

## 2019-09-02 NOTE — Progress Notes (Signed)
Subjective:     Patient ID: [name], female   DOB: September 24, 1955,    MRN: [id number]    Brief patient profile:  40 yowf never smoker with onset of resp problems in 20s started with nasal symptoms then freq pna with initial eval pos for allergies in her 30's on shots x sev years  and some better but recurrent "pna"  Each fall wih last eval by Dr Ashok Cordia 01/18/17     PFT 01/18/17: FVC 1.72 L (62%) FEV1 1.29 L (61%) FEV1/FVC 0.75  negative bronchodilator response   ERV 32% DLCO corrected 74%  IMAGING CT CHEST W/O 11/23/16  Minimal dependent atelectasis in the bases. No parenchymal nodule or opacity appreciated. No pleural effusion or thickening. No pericardial effusion. No pathologic mediastinal adenopathy.  LABS 11/15/16: IgG: 789 IgA: 150 IgM: 86 IgE: 618 RAST panel: [place] 0.26/[place] 0.11/Aspergillus fumigatus 1.84 CBC: 11.8/11.4/35.0/266 Eosinophils: 0.3      history of chronic bronchitis and remote diagnosis of asthma. At last appointment she had symptoms consistent with nonseasonal chronic allergic rhinitis as well as GERD. Lung volumes performed today shows no significant bronchodilator response but does show mild restrictive lung diseaseLikely due to her mild central obesity.  Her reflux seems to be controlled at this time. Her chronic allergic rhinitis is suboptimally controlled and would benefit from regular, daily use of intranasal corticosteroids. I instructed the patient to notify me if she had any new breathing problems or questions before next appointment.  1. Simple chronic bronchitis:Likely due to previous exacerbations of asthma. 2. Mild, persistent asthma: Continuing patient on Singulair 10 mg by mouth daily at bedtime. Continuing albuterol inhaler as needed. 3. Chronic nonseasonal allergic rhinitis: Patient intolerant of intranasal saline rinses. Recommended using Flonase 1 spray in each nostril twice daily. Continuing Singulair and Claritin. 4. GERD: Controlled with  Prilosec. No new medications at this time. 5. Mild restrictive lung disease: Likely secondary to obesity without evidence of fibrotic changes on CT imaging. No new testing. 6. Health maintenance: Status post influenza vaccine 2017. She is allergic to the Pneumovax.  7. Follow-up: Return to clinic in 6 months or sooner if needed.   07/24/2017  Transition of care extended  ov/Ferol Laiche re: establish re cough variant asthma vs uacs  ON ACEi Chief Complaint  Patient presents with  . Follow-up    Increased cough and SOB for the past 2 wks. She is having come sinus pressure, HA and left ear pain today. She is not producing any sputum.  She has been using her albuterol inhaler 2 x daily on average.   trending better over the last two weeks p "caught another cold" >>still having sensation of pnds  On chronic ACIEi/ cough worse when wakes up in am but s excess/ purulent sputum or mucus plugs  And usually not needing saba at all but presently at least 2x daily never noct rec When you have any respiratory flare:   Change the prilosec to take it 30 minutes before your first meal and add pepcid 20 mg at bedtime and do this until no cough x 1 week off all cough medications  For drainage / throat tickle try take CHLORPHENIRAMINE  4 mg - take one every 4 hours as needed - available over the counter- may cause drowsiness so start with just a bedtime dose or two and see how you tolerate it before trying in daytime   If not doing well  with respiratory symptoms and needing the albuterol more than twice  a week then  the next step is to find an alternative to your lisinopril per [facility] then return after 6 weeks.   06/28/18 NP rec [name] in office today - patient remained at 98% on RA the entire walk Spirometry in office today - stable from PFT 2 years ago Will order amoxicillin for sinusitis Will refill albuterol Please keep upcoming appointment with Cardiology Please follow up with Dr. Melvyn Novas at his first available  appt in around 4 weeks     07/26/2018  f/u ov/Darrow Barreiro re: uacs 100% better after last ov  Then ? Flu like illness 06/30/18 tamiflu Chief Complaint  Patient presents with  . Follow-up    SOB with exertion, cough with some mucus but now non-productive  Dyspnea:  Across parking lot  / no variability  Cough: at hs / min mucus Sleeping: pillows not bed blocks SABA use: once a week uses but really makes no difference  02: none  rec When you have any respiratory flare with cough or wheezing/ short of breath   In addition to  the prilosec to take it 30 minutes before your first meal and add pepcid 20 mg after supper  and do this until no cough x 1 week off all cough medications  For drainage / throat tickle try take CHLORPHENIRAMINE  4 mg - take one every 4 hours as needed - available over the counter  GERD diet      09/02/2019  f/u ov/Judythe Postema re: uacs  Vs cough variant asthma  maint on singulair Chief Complaint  Patient presents with  . Follow-up    Breathing is about the same. She is coughing less. She rarely uses her albuterol inhaler.   Dyspnea:  Limited by back pain, sob carrying laundry/ steps ok at her house but only has 3 steps to go up Cough: less / some in am taking h1 x one at hs  Sleeping: adjusted bed x 30 degrees helps  SABA use: rarely  02: none    No obvious day to day or daytime variability or assoc excess/ purulent sputum or mucus plugs or hemoptysis or cp or chest tightness, subjective wheeze or overt sinus or hb symptoms.   Sleeping  without nocturnal  or early am exacerbation  of respiratory  c/o's or need for noct saba. Also denies any obvious fluctuation of symptoms with weather or environmental changes or other aggravating or alleviating factors except as outlined above   No unusual exposure hx or h/o childhood pna/ asthma or knowledge of premature birth.  Current Allergies, Complete Past Medical History, Past Surgical History, Family History, and Social History were  reviewed in [employer] record.  ROS  The following are not active complaints unless bolded Hoarseness, sore throat, dysphagia, dental problems, itching, sneezing,  nasal congestion or discharge of excess mucus or purulent secretions, ear ache,   fever, chills, sweats, unintended wt loss or wt gain, classically pleuritic or exertional cp,  orthopnea pnd or arm/hand swelling  or leg swelling, presyncope, palpitations, abdominal pain, anorexia, nausea, vomiting, diarrhea  or change in bowel habits or change in bladder habits, change in stools or change in urine, dysuria, hematuria,  rash, arthralgias, visual complaints, headache, numbness, weakness or ataxia or problems with walking or coordination,  change in mood or  memory.        Current Meds  Medication Sig  . acetaminophen (TYLENOL) 500 MG tablet Take 500 mg every 6 (six) hours as needed by mouth.  Marland Kitchen  albuterol (PROVENTIL HFA;VENTOLIN HFA) 108 (90 Base) MCG/ACT inhaler Inhale 2 puffs into the lungs as needed for wheezing or shortness of breath.  . ALPRAZolam (XANAX) 0.25 MG tablet Take 0.25 mg by mouth 3 (three) times daily as needed for anxiety.   Marland Kitchen atorvastatin (LIPITOR) 10 MG tablet Take 10 mg by mouth daily at 6 PM.   . carvedilol (COREG) 6.25 MG tablet Take 6.25 mg by mouth 2 (two) times daily with a meal.   . ELIQUIS 5 MG TABS tablet Take 1 tablet (5 mg total) by mouth 2 (two) times daily.  . flecainide (TAMBOCOR) 50 MG tablet Take 1 tablet (50 mg total) by mouth 2 (two) times daily.  . fluticasone (FLONASE) 50 MCG/ACT nasal spray PLACE 1 SPRAY INTO BOTH NOSTRILS 2 (TWO) TIMES DAILY.  . furosemide (LASIX) 20 MG tablet Take 20 mg by mouth daily.  Marland Kitchen glipiZIDE (GLUCOTROL XL) 10 MG 24 hr tablet Take 10 mg by mouth daily.  Marland Kitchen glucagon (GLUCAGON EMERGENCY) 1 MG injection Inject 1 mg into the muscle as needed.  . loratadine (CLARITIN) 10 MG tablet Take 10 mg by mouth daily as needed for allergies.  . metFORMIN (GLUCOPHAGE)  1000 MG tablet Take 1,000 mg by mouth 2 (two) times daily with a meal.  . montelukast (SINGULAIR) 10 MG tablet TAKE 1 TABLET BY MOUTH EVERYDAY AT BEDTIME  . omeprazole (PRILOSEC) 20 MG capsule Take 20 mg by mouth daily.   Marland Kitchen oxybutynin (DITROPAN-XL) 10 MG 24 hr tablet Take 10 mg by mouth daily.                 Objective:   Physical Exam    09/02/2019          158   07/26/2018       159   07/24/17 150 lb 12.8 oz (68.4 kg)  05/22/17 146 lb 4 oz (66.3 kg)  04/17/17 147 lb (66.7 kg)     amb wf no longer throat clearing   Vital signs reviewed  09/02/2019  - Note at rest 02 sats  99% on RA      HEENT : pt wearing mask not removed for exam due to covid -19 concerns.    NECK :  without JVD/Nodes/TM/ nl carotid upstrokes bilaterally   LUNGS: no acc muscle use,  Nl contour chest which is clear to A and P bilaterally without cough on insp or exp maneuvers   CV:  RRR  no s3 or murmur or increase in P2, and no edema   ABD:  soft and nontender with nl inspiratory excursion in the supine position. No bruits or organomegaly appreciated, bowel sounds nl  MS:  Nl gait/ ext warm without deformities, calf tenderness, cyanosis or clubbing No obvious joint restrictions   SKIN: warm and dry without lesions    NEURO:  alert, approp, nl sensorium with  no motor or cerebellar deficits apparent.         Assessment:

## 2019-09-02 NOTE — Patient Instructions (Signed)
No change in medications   Only use your albuterol as a rescue medication to be used if you can't catch your breath by resting or doing a relaxed purse lip breathing pattern.  - The less you use it, the better it will work when you need it. - Ok to use up to 2 puffs  every 4 hours if you must but call for immediate appointment if use goes up over your usual need - Don't leave home without it !!  (think of it like the spare tire for your car)    Please schedule a follow up visit in 12 months but call sooner if needed    

## 2019-09-03 ENCOUNTER — Encounter: Payer: Self-pay | Admitting: Internal Medicine

## 2019-09-03 ENCOUNTER — Inpatient Hospital Stay: Payer: Federal, State, Local not specified - PPO | Attending: Hematology & Oncology

## 2019-09-03 VITALS — BP 107/67 | HR 85 | Temp 97.8°F | Resp 17

## 2019-09-03 DIAGNOSIS — D509 Iron deficiency anemia, unspecified: Secondary | ICD-10-CM | POA: Insufficient documentation

## 2019-09-03 DIAGNOSIS — D508 Other iron deficiency anemias: Secondary | ICD-10-CM

## 2019-09-03 MED ORDER — SODIUM CHLORIDE 0.9 % IV SOLN
INTRAVENOUS | Status: DC
  Filled 2019-09-03: qty 250

## 2019-09-03 MED ORDER — SODIUM CHLORIDE 0.9 % IV SOLN
510.0000 mg | Freq: Once | INTRAVENOUS | Status: AC
  Administered 2019-09-03: 510 mg via INTRAVENOUS
  Filled 2019-09-03: qty 17

## 2019-09-03 NOTE — Patient Instructions (Signed)

## 2019-09-03 NOTE — Assessment & Plan Note (Signed)
Onset in her 20s some better p rx with allergy shots in her 30s Singulair trial 11/15/16 >  Improved 01/18/17:  FEV1 1.29 L (61%)  Ratio 75  Still on ACEi as of 07/24/2017  Spirometry 06/28/18   FEV1 1.3 (61%)  Ratio 77 with min curvature   - Allergy profile 07/26/2018 >  Eos 1.2 /  IgE  420  RAST Pos mold only   Not clear how much is uacs vs asthma but if it is asthma >> All goals of chronic asthma control met including optimal function and elimination of symptoms with minimal need for rescue therapy.  Contingencies discussed in full including contacting this office immediately if not controlling the symptoms using the rule of two's.     F/u can be yearly             Each maintenance medication was reviewed in detail including emphasizing most importantly the difference between maintenance and prns and under what circumstances the prns are to be triggered using an action plan format where appropriate.  Total time for H and P, chart review, counseling,  and generating customized AVS unique to this office visit / charting = 20 mn         

## 2019-09-11 ENCOUNTER — Telehealth: Payer: Self-pay | Admitting: Cardiology

## 2019-09-11 NOTE — Telephone Encounter (Signed)
I spoke to the patient who said that she had been doing fine until this past Monday.  She woke up in A Fib HR 120-150.    On Monday night she spoke with an on call physician who advised an extra Flecainide and by Tuesday morning, she was back in Sinus Rhythm, although now Tachycardic with a constant rate of 105 bpm.  She denies any other symptoms such as CP or SOB.

## 2019-09-11 NOTE — Telephone Encounter (Signed)
Please bring patient to the office to have EKG done to check the rhythm

## 2019-09-11 NOTE — Telephone Encounter (Signed)
  Patient states that she was in afib a couple days ago and that stopped but now she is having some tachycardia. She states the tachycardia started after her heart went back into rhythm yesterday. She says every time she has checked her HR it has been around 105. Patient denies any other symptoms.

## 2019-09-11 NOTE — Telephone Encounter (Signed)
Called patient, scheduled her with Dr. Agustin Cree on Friday 09/13/2019.

## 2019-09-13 ENCOUNTER — Other Ambulatory Visit: Payer: Self-pay

## 2019-09-13 ENCOUNTER — Encounter: Payer: Self-pay | Admitting: Cardiology

## 2019-09-13 ENCOUNTER — Ambulatory Visit: Payer: Federal, State, Local not specified - PPO | Admitting: Cardiology

## 2019-09-13 VITALS — BP 110/60 | HR 80 | Ht 59.0 in | Wt 155.6 lb

## 2019-09-13 DIAGNOSIS — I420 Dilated cardiomyopathy: Secondary | ICD-10-CM | POA: Diagnosis not present

## 2019-09-13 DIAGNOSIS — I10 Essential (primary) hypertension: Secondary | ICD-10-CM | POA: Diagnosis not present

## 2019-09-13 DIAGNOSIS — I480 Paroxysmal atrial fibrillation: Secondary | ICD-10-CM | POA: Diagnosis not present

## 2019-09-13 NOTE — Patient Instructions (Signed)

## 2019-09-13 NOTE — Progress Notes (Signed)
Cardiology Office Note:    Date:  09/13/2019   ID:  [name], DOB Nov 13, 1955, MRN [id number]  PCP:  Enid Skeens., MD  Cardiologist:  Jenne Campus, MD    Referring MD: Enid Skeens., MD   No chief complaint on file. I have episodes of palpitations  History of Present Illness:    [name] is a 64 y.o. female with past medical history significant for paroxysmal atrial fibrillation, cardiomyopathy with normalization, she used to be on Entresto, however, Delene Loll has been withdrawn secondary to hyperkalemia.  At the same time she got normal left ventricular ejection fraction.  Recently she started having some ear problem and she was given steroids for it.  Few days after that she started having some palpitations on Monday she actually call physician on call complaining of having palpitations.  She does have cardia device that allowed her to record her EKG and EKG showed atrial fibrillation.  She was advised to take extra dose of flecainide as well as extra half a tablet of carvedilol.  Next day she converted to sinus rhythm.  Since that time she is doing well but complain of having some shakiness in her chest.  EKG done today showed normal sinus rhythm.  We had a long discussion about what to do with the situation with talk about potentially going on higher dose of flecainide however she does not want to do it and I think it would be reasonable to simply wait and see what happened with the understanding if she had more episode of atrial fibrillation then dose of flecainide need to be increased.  She is anticoagulated which I will continue.  Otherwise denies have any chest pain tightness squeezing pressure burning in the chest  Past Medical History:  Diagnosis Date  . Arthritis    "back, knees, arms, wrists" (05/15/2013)  . Asthma   . Breast cancer ([place])   . CHF (congestive heart failure) ([place])    "mild" (05/15/2013)  . Chronic bronchitis ([place])   . Chronic lower back  pain   . Dysrhythmia    atrial fib/dr [facility]  . Family history of anesthesia complication    "my mother also had PONV" (05/15/2013)  . GERD (gastroesophageal reflux disease)   . Heart murmur   . High cholesterol   . Kidney stones   . Migraine    "haven't had one in the early 2000's" (05/15/2013)  . Personal history of chemotherapy   . Personal history of radiation therapy   . Pneumonia    "used to be chronic; last time I had it was 2013" (05/15/2013)  . PONV (postoperative nausea and vomiting)   . Recurrent UTI (urinary tract infection)    "from the continuous kidney stones; take Macrodantin qd" (05/15/2013)  . Sepsis ([place]) 05   kidney stone infection  . Tension headache   . Type II diabetes mellitus ([place])     Past Surgical History:  Procedure Laterality Date  . 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 BREAST BIOPSY Left 02/2001  . BREAST LUMPECTOMY Left 02/2001  . BREAST LUMPECTOMY Left 09/23/2013   Procedure: LEFT LUMPECTOMY WITH SPECIMEN MAMMOGRAM;  Surgeon: Adin Hector, MD;  Location: [facility];  Service: General;  Laterality: Left;  . COLONOSCOPY    . DIAGNOSTIC LAPAROSCOPY     cyst-near ovary  . LITHOTRIPSY     "  2-3 times prior to 2002" (05/15/2013)  . SPINAL FUSION N/A 05/08/2013   Procedure: T9-S1 INSTRUMENTED FUSION T12 -S1 DECOMPRESSION;  Surgeon: Melina Schools, MD;  Location: [facility];  Service: Orthopedics;  Laterality: N/A;  . TUBAL LIGATION Bilateral 1985    Current Medications: Current Meds  Medication Sig  . acetaminophen (TYLENOL) 500 MG tablet Take 500 mg every 6 (six) hours as needed by mouth.  Marland Kitchen albuterol (VENTOLIN HFA) 108 (90 Base) MCG/ACT inhaler Inhale 2 puffs into the lungs as needed for wheezing or shortness of breath.  . ALPRAZolam (XANAX) 0.25 MG tablet Take 0.25 mg by mouth 3 (three) times daily as needed for anxiety.     Marland Kitchen atorvastatin (LIPITOR) 10 MG tablet Take 10 mg by mouth daily at 6 PM.   . carvedilol (COREG) 6.25 MG tablet Take 6.25 mg by mouth 2 (two) times daily with a meal.   . ELIQUIS 5 MG TABS tablet Take 1 tablet (5 mg total) by mouth 2 (two) times daily.  . flecainide (TAMBOCOR) 50 MG tablet Take 1 tablet (50 mg total) by mouth 2 (two) times daily.  . fluticasone (FLONASE) 50 MCG/ACT nasal spray PLACE 1 SPRAY INTO BOTH NOSTRILS 2 (TWO) TIMES DAILY.  . furosemide (LASIX) 20 MG tablet Take 20 mg by mouth daily.  Marland Kitchen glipiZIDE (GLUCOTROL XL) 10 MG 24 hr tablet Take 10 mg by mouth daily.  Marland Kitchen glucagon (GLUCAGON EMERGENCY) 1 MG injection Inject 1 mg into the muscle as needed.  . loratadine (CLARITIN) 10 MG tablet Take 10 mg by mouth daily as needed for allergies.  . metFORMIN (GLUCOPHAGE) 1000 MG tablet Take 1,000 mg by mouth 2 (two) times daily with a meal.  . montelukast (SINGULAIR) 10 MG tablet One a bedtime  . omeprazole (PRILOSEC) 20 MG capsule Take 20 mg by mouth daily.   Marland Kitchen oxybutynin (DITROPAN-XL) 10 MG 24 hr tablet Take 10 mg by mouth daily.  . predniSONE (DELTASONE) 10 MG tablet Take 1 tablet by mouth as directed.     Allergies:   Ativan [lorazepam], Other, Cleocin [clindamycin hcl], Morphine and related, Pneumococcal vaccines, and Buprenorphine hcl   Social History   Socioeconomic History  . Marital status: Married    Spouse name: Not on file  . Number of children: Not on file  . Years of education: Not on file  . Highest education level: Not on file  Occupational History  . Not on file  Tobacco Use  . Smoking status: Passive Smoke Exposure - Never Smoker  . Smokeless tobacco: Never Used  . Tobacco comment: Mother & Father smoked  Substance and Sexual Activity  . Alcohol use: No    Alcohol/week: 0.0 standard drinks  . Drug use: No  . Sexual activity: Yes  Other Topics Concern  . Not on file  Social History Narrative   [facility] (11/15/16):   Patient's originally from  [place]. Has always lived in [place]. Currently has an outside dog. Remote exposure to [place] when her children were young. No known mold exposure. 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    Social Determinants of Health   Financial Resource Strain:   . Difficulty of Paying Living Expenses:   Food Insecurity:   . Worried About Charity fundraiser in the Last Year:   . Arboriculturist in the Last Year:   Transportation Needs:   . Film/video editor (Medical):   Marland Kitchen Lack of Transportation (Non-Medical):  Physical Activity:   . Days of Exercise per Week:   . Minutes of Exercise per Session:   Stress:   . Feeling of Stress :   Social Connections:   . Frequency of Communication with Friends and Family:   . Frequency of Social Gatherings with Friends and Family:   . Attends Religious Services:   . Active Member of Clubs or Organizations:   . Attends Archivist Meetings:   Marland Kitchen Marital Status:      Family History: The patient's family history includes Breast cancer in her mother; COPD in her mother; Environmental Allergies in her son and another family member; Heart disease in her mother; Kidney cancer in her father; Liver cancer in her brother; Lupus in an other family member. ROS:   Please see the history of present illness.    All 14 point review of systems negative except as described per history of present illness  EKGs/Labs/Other Studies Reviewed:      Recent Labs: 10/01/2018: B Natriuretic Peptide 2,948.4 10/04/2018: Magnesium 1.6 08/30/2019: ALT 26; BUN 46; Creatinine 1.62; Hemoglobin 11.0; Platelet Count 263; Potassium 4.3; Sodium 140  Recent Lipid Panel    Component Value Date/Time   TRIG 155 (H) 05/08/2013 2023    Physical Exam:    VS:  BP 110/60   Pulse 80   Ht 4\' 11"  (1.499 m)   Wt 155 lb 9.6 oz (70.6 kg)   SpO2 96%   BMI 31.43 kg/m     Wt Readings from Last 3 Encounters:  09/13/19 155 lb  9.6 oz (70.6 kg)  09/02/19 158 lb 9.6 oz (71.9 kg)  08/30/19 157 lb (71.2 kg)     GEN:  Well nourished, well developed in no acute distress HEENT: Normal NECK: No JVD; No carotid bruits LYMPHATICS: No lymphadenopathy CARDIAC: RRR, no murmurs, no rubs, no gallops RESPIRATORY:  Clear to auscultation without rales, wheezing or rhonchi  ABDOMEN: Soft, non-tender, non-distended MUSCULOSKELETAL:  No edema; No deformity  SKIN: Warm and dry LOWER EXTREMITIES: no swelling NEUROLOGIC:  Alert and oriented x 3 PSYCHIATRIC:  Normal affect   ASSESSMENT:    1. Paroxysmal atrial fibrillation (HCC)   2. Dilated cardiomyopathy ([place])   3. Essential hypertension    PLAN:    In order of problems listed above:  1. Paroxysmal atrial fibrillation EKG today shows sinus rhythm plan as outlined above.  We will continue anticoagulation 2. History of dilated cardiomyopathy with normalization.  She is scheduled to have echocardiogram in the middle of summer. 3. Essential hypertension well-controlled continue present management.   Medication Adjustments/Labs and Tests Ordered: Current medicines are reviewed at length with the patient today.  Concerns regarding medicines are outlined above.  No orders of the defined types were placed in this encounter.  Medication changes: No orders of the defined types were placed in this encounter.   Signed, Park Liter, MD, [facility] 09/13/2019 2:51 PM    [place]

## 2019-09-23 ENCOUNTER — Other Ambulatory Visit: Payer: Self-pay | Admitting: Cardiology

## 2019-10-03 ENCOUNTER — Telehealth: Payer: Self-pay | Admitting: Hematology & Oncology

## 2019-10-03 ENCOUNTER — Telehealth: Payer: Self-pay | Admitting: Cardiology

## 2019-10-03 MED ORDER — ELIQUIS 5 MG PO TABS: 5.0000 mg | ORAL_TABLET | Freq: Two times a day (BID) | ORAL | 4 refills | Status: DC

## 2019-10-03 NOTE — Telephone Encounter (Signed)
New Message  Pt c/o medication issue:  1. Name of Medication:   ELIQUIS 5 MG TABS tablet   2. How are you currently taking this medication (dosage and times per day)? As written  3. Are you having a reaction (difficulty breathing--STAT)? No  4. What is your medication issue? Pt is out of refills. Needs a new prescription filled by Dr.

## 2019-10-03 NOTE — Telephone Encounter (Signed)
Patient called in to reschedule her 4/9 appts w/ Dr Marin Olp.  She was ok with new date/time per 4/1 phone call

## 2019-10-03 NOTE — Telephone Encounter (Signed)
Pt is requesting a month supply of her Eliquis to be sent to her confirmed pharmacy of choice.  As indicated in Dr. Flo Shanks OV note below, he wants the pt to continue her current anticoagulation therapy.  Pt aware that medication is being sent now for month supply, to her confirmed pharmacy of choice.  Pt verbalized understanding and agrees with this plan.         PLAN:    In order of problems listed above:  1. Paroxysmal atrial fibrillation EKG today shows sinus rhythm plan as outlined above.  We will continue anticoagulation 2. History of dilated cardiomyopathy with normalization.  She is scheduled to have echocardiogram in the middle of summer. 3. Essential hypertension well-controlled continue present management.   Medication Adjustments/Labs and Tests Ordered: Current medicines are reviewed at length with the patient today.  Concerns regarding medicines are outlined above.  No orders of the defined types were placed in this encounter.  Medication changes: No orders of the defined types were placed in this encounter.   Signed, Park Liter, MD, [facility] 09/13/2019 2:51 PM    [place]

## 2019-10-11 ENCOUNTER — Other Ambulatory Visit: Payer: Federal, State, Local not specified - PPO

## 2019-10-11 ENCOUNTER — Ambulatory Visit: Payer: Federal, State, Local not specified - PPO

## 2019-10-11 ENCOUNTER — Ambulatory Visit: Payer: Federal, State, Local not specified - PPO | Admitting: Hematology & Oncology

## 2019-10-21 ENCOUNTER — Other Ambulatory Visit: Payer: Self-pay | Admitting: Otolaryngology

## 2019-10-21 DIAGNOSIS — IMO0001 Reserved for inherently not codable concepts without codable children: Secondary | ICD-10-CM

## 2019-10-21 DIAGNOSIS — H9042 Sensorineural hearing loss, unilateral, left ear, with unrestricted hearing on the contralateral side: Secondary | ICD-10-CM

## 2019-10-28 ENCOUNTER — Inpatient Hospital Stay: Payer: Federal, State, Local not specified - PPO | Admitting: Hematology & Oncology

## 2019-10-28 ENCOUNTER — Telehealth: Payer: Self-pay | Admitting: Hematology & Oncology

## 2019-10-28 ENCOUNTER — Inpatient Hospital Stay: Payer: Federal, State, Local not specified - PPO

## 2019-10-28 NOTE — Telephone Encounter (Signed)
I called and spoke with patient's husband Lynnae Sandhoff regarding appointment times that have changed per Dr Tomma Rakers request 4/26.  He was ok with new times

## 2019-11-14 ENCOUNTER — Ambulatory Visit
Admission: RE | Admit: 2019-11-14 | Discharge: 2019-11-14 | Disposition: A | Discharge status: Home / Self Care | Payer: Federal, State, Local not specified - PPO | Source: Ambulatory Visit | Attending: Otolaryngology | Admitting: Otolaryngology

## 2019-11-14 ENCOUNTER — Other Ambulatory Visit: Payer: Self-pay

## 2019-11-14 DIAGNOSIS — IMO0001 Reserved for inherently not codable concepts without codable children: Secondary | ICD-10-CM

## 2019-11-14 MED ORDER — GADOBENATE DIMEGLUMINE 529 MG/ML IV SOLN
14.0000 mL | Freq: Once | INTRAVENOUS | Status: AC | PRN
  Administered 2019-11-14: 14 mL via INTRAVENOUS

## 2019-11-19 ENCOUNTER — Ambulatory Visit: Payer: Federal, State, Local not specified - PPO | Admitting: Hematology & Oncology

## 2019-11-19 ENCOUNTER — Telehealth: Payer: Self-pay | Admitting: Family

## 2019-11-19 ENCOUNTER — Telehealth: Payer: Self-pay | Admitting: *Deleted

## 2019-11-19 ENCOUNTER — Inpatient Hospital Stay: Payer: Federal, State, Local not specified - PPO

## 2019-11-19 ENCOUNTER — Inpatient Hospital Stay: Payer: Federal, State, Local not specified - PPO | Attending: Hematology & Oncology

## 2019-11-19 ENCOUNTER — Ambulatory Visit: Payer: Federal, State, Local not specified - PPO

## 2019-11-19 ENCOUNTER — Encounter: Payer: Self-pay | Admitting: Family

## 2019-11-19 ENCOUNTER — Inpatient Hospital Stay (HOSPITAL_BASED_OUTPATIENT_CLINIC_OR_DEPARTMENT_OTHER): Payer: Federal, State, Local not specified - PPO | Admitting: Family

## 2019-11-19 ENCOUNTER — Other Ambulatory Visit: Payer: Federal, State, Local not specified - PPO

## 2019-11-19 ENCOUNTER — Other Ambulatory Visit: Payer: Self-pay

## 2019-11-19 VITALS — BP 122/70 | HR 81 | Temp 97.1°F | Resp 18 | Ht 59.0 in | Wt 153.0 lb

## 2019-11-19 DIAGNOSIS — Z7901 Long term (current) use of anticoagulants: Secondary | ICD-10-CM | POA: Diagnosis not present

## 2019-11-19 DIAGNOSIS — I509 Heart failure, unspecified: Secondary | ICD-10-CM | POA: Insufficient documentation

## 2019-11-19 DIAGNOSIS — N183 Chronic kidney disease, stage 3 unspecified: Secondary | ICD-10-CM

## 2019-11-19 DIAGNOSIS — I4891 Unspecified atrial fibrillation: Secondary | ICD-10-CM | POA: Insufficient documentation

## 2019-11-19 DIAGNOSIS — D500 Iron deficiency anemia secondary to blood loss (chronic): Secondary | ICD-10-CM

## 2019-11-19 DIAGNOSIS — D509 Iron deficiency anemia, unspecified: Secondary | ICD-10-CM | POA: Diagnosis not present

## 2019-11-19 DIAGNOSIS — Z853 Personal history of malignant neoplasm of breast: Secondary | ICD-10-CM | POA: Diagnosis not present

## 2019-11-19 DIAGNOSIS — D631 Anemia in chronic kidney disease: Secondary | ICD-10-CM

## 2019-11-19 DIAGNOSIS — D508 Other iron deficiency anemias: Secondary | ICD-10-CM | POA: Diagnosis not present

## 2019-11-19 LAB — CBC WITH DIFFERENTIAL (CANCER CENTER ONLY)
Abs Immature Granulocytes: 0.11 10*3/uL — ABNORMAL HIGH (ref 0.00–0.07)
Basophils Absolute: 0.1 10*3/uL (ref 0.0–0.1)
Basophils Relative: 1 %
Eosinophils Absolute: 0.4 10*3/uL (ref 0.0–0.5)
Eosinophils Relative: 5 %
HCT: 35.2 % — ABNORMAL LOW (ref 36.0–46.0)
Hemoglobin: 11.1 g/dL — ABNORMAL LOW (ref 12.0–15.0)
Immature Granulocytes: 1 %
Lymphocytes Relative: 25 %
Lymphs Abs: 2.2 10*3/uL (ref 0.7–4.0)
MCH: 30.8 pg (ref 26.0–34.0)
MCHC: 31.5 g/dL (ref 30.0–36.0)
MCV: 97.8 fL (ref 80.0–100.0)
Monocytes Absolute: 0.6 10*3/uL (ref 0.1–1.0)
Monocytes Relative: 7 %
Neutro Abs: 5.4 10*3/uL (ref 1.7–7.7)
Neutrophils Relative %: 61 %
Platelet Count: 304 10*3/uL (ref 150–400)
RBC: 3.6 MIL/uL — ABNORMAL LOW (ref 3.87–5.11)
RDW: 14 % (ref 11.5–15.5)
WBC Count: 8.8 10*3/uL (ref 4.0–10.5)
nRBC: 0 % (ref 0.0–0.2)

## 2019-11-19 LAB — RETICULOCYTES
Immature Retic Fract: 10.7 % (ref 2.3–15.9)
RBC.: 3.54 MIL/uL — ABNORMAL LOW (ref 3.87–5.11)
Retic Count, Absolute: 47.1 10*3/uL (ref 19.0–186.0)
Retic Ct Pct: 1.3 % (ref 0.4–3.1)

## 2019-11-19 LAB — CMP (CANCER CENTER ONLY)
ALT: 18 U/L (ref 0–44)
AST: 17 U/L (ref 15–41)
Albumin: 4.5 g/dL (ref 3.5–5.0)
Alkaline Phosphatase: 67 U/L (ref 38–126)
Anion gap: 17 — ABNORMAL HIGH (ref 5–15)
BUN: 89 mg/dL — ABNORMAL HIGH (ref 8–23)
CO2: 16 mmol/L — ABNORMAL LOW (ref 22–32)
Calcium: 9.4 mg/dL (ref 8.9–10.3)
Chloride: 104 mmol/L (ref 98–111)
Creatinine: 5.5 mg/dL (ref 0.44–1.00)
GFR, Est AFR Am: 9 mL/min — ABNORMAL LOW (ref >60)
GFR, Estimated: 8 mL/min — ABNORMAL LOW (ref >60)
Glucose, Bld: 188 mg/dL — ABNORMAL HIGH (ref 70–99)
Potassium: 5.2 mmol/L — ABNORMAL HIGH (ref 3.5–5.1)
Sodium: 137 mmol/L (ref 135–145)
Total Bilirubin: 0.3 mg/dL (ref 0.3–1.2)
Total Protein: 7 g/dL (ref 6.5–8.1)

## 2019-11-19 MED ORDER — DARBEPOETIN ALFA 300 MCG/0.6ML IJ SOSY
PREFILLED_SYRINGE | INTRAMUSCULAR | Status: AC
  Filled 2019-11-19: qty 0.6

## 2019-11-19 NOTE — Progress Notes (Addendum)
Hematology and Oncology Follow Up Visit  [name] [id number] 12/19/1955 64 y.o. 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 is here today for follow-up. She is doing well but has some fatigue at times.  She has occasional SOB with CHF exacerbation and palpitations with atrial fib.  She is on Eliquis and tolerating well. No episodes of bleeding noted. No bruising or petechiae.  No fever, chills, n/v, cough, rash, dizziness, chest pain, abdominal pain or changes in bowel or bladder habits.  No swelling, tenderness, numbness or tingling in her extremities.  No falls or syncope.  She has maintained a good appetite and is staying well hydrated. Her weight is stable.  She stays busy spending time with all her sweet grand babies.   ECOG Performance Status: 1 - Symptomatic but completely ambulatory  Medications:  Allergies as of 11/19/2019      Reactions   Ativan [lorazepam]    Other Nausea And Vomiting, Swelling   Shrimp if eat a lot   Cleocin [clindamycin Hcl] Other (See Comments)   "irritated my esophagus"   Morphine And Related Nausea And Vomiting   Pneumococcal Vaccines Other (See Comments)   Really high fever, and flu symptoms. MD instructed not to give   Buprenorphine Hcl Nausea And Vomiting      Medication List       Accurate as of Nov 19, 2019 11:01 AM. If you have any questions, ask your nurse or doctor.        acetaminophen 500 MG tablet Commonly known as: TYLENOL Take 500 mg every 6 (six) hours as needed by mouth.   albuterol 108 (90 Base) MCG/ACT inhaler Commonly known as: VENTOLIN HFA Inhale 2 puffs into the lungs as needed for wheezing or shortness of breath.   ALPRAZolam 0.25 MG  tablet Commonly known as: XANAX Take 0.25 mg by mouth 3 (three) times daily as needed for anxiety.   atorvastatin 10 MG tablet Commonly known as: LIPITOR Take 10 mg by mouth daily at 6 PM.   carvedilol 6.25 MG tablet Commonly known as: COREG Take 6.25 mg by mouth 2 (two) times daily with a meal.   Eliquis 5 MG Tabs tablet Generic drug: apixaban Take 1 tablet (5 mg total) by mouth 2 (two) times daily.   flecainide 50 MG tablet Commonly known as: TAMBOCOR Take 1 tablet (50 mg total) by mouth 2 (two) times daily.   fluticasone 50 MCG/ACT nasal spray Commonly known as: FLONASE PLACE 1 SPRAY INTO BOTH NOSTRILS 2 (TWO) TIMES DAILY.   furosemide 20 MG tablet Commonly known as: LASIX Take 20 mg by mouth daily.   glipiZIDE 10 MG 24 hr tablet Commonly known as: GLUCOTROL XL Take 10 mg by mouth daily.   Glucagon Emergency 1 MG Kit Inject 1 mg into the muscle as needed.   loratadine 10 MG tablet Commonly known as: CLARITIN Take 10 mg by mouth daily as needed for allergies.   metFORMIN 1000 MG tablet Commonly known as: GLUCOPHAGE Take 1,000 mg by mouth 2 (two) times daily with a meal.   montelukast 10 MG tablet Commonly known as: SINGULAIR One a bedtime   omeprazole 20 MG capsule Commonly known as: PRILOSEC Take  20 mg by mouth daily.   oxybutynin 10 MG 24 hr tablet Commonly known as: DITROPAN-XL Take 10 mg by mouth daily.   predniSONE 10 MG tablet Commonly known as: DELTASONE Take 1 tablet by mouth as directed.       Allergies:  Allergies  Allergen Reactions  . Ativan [Lorazepam]   . Other Nausea And Vomiting and Swelling    Shrimp if eat a lot  . Cleocin [Clindamycin Hcl] Other (See Comments)    "irritated my esophagus"  . Morphine And Related Nausea And Vomiting  . Pneumococcal Vaccines Other (See Comments)    Really high fever, and flu symptoms. MD instructed not to give  . Buprenorphine Hcl Nausea And Vomiting    Past Medical History, Surgical  history, Social history, and Family History were reviewed and updated.  Review of Systems: All other 10 point review of systems is negative.   Physical Exam:  vitals were not taken for this visit.   Wt Readings from Last 3 Encounters:  09/13/19 155 lb 9.6 oz (70.6 kg)  09/02/19 158 lb 9.6 oz (71.9 kg)  08/30/19 157 lb (71.2 kg)    Ocular: Sclerae unicteric, pupils equal, round and reactive to light Ear-nose-throat: Oropharynx clear, dentition fair Lymphatic: No cervical, supraclavicular or axillary adenopathy Lungs no rales or rhonchi, good excursion bilaterally Heart regular rate and rhythm, no murmur appreciated Abd soft, nontender, positive bowel sounds, no liver or spleen tip palpated on exam, no fluid wave  MSK no focal spinal tenderness, no joint edema Neuro: non-focal, well-oriented, appropriate affect Breasts: Occasional musculoskeletal pain in the left side/flank post surgery, left breast contracted from previous surgery and radiation, no changes on exam. No mass, lesion or rash noted.   Lab Results  Component Value Date   WBC 8.8 11/19/2019   HGB 11.1 (L) 11/19/2019   HCT 35.2 (L) 11/19/2019   MCV 97.8 11/19/2019   PLT 304 11/19/2019   Lab Results  Component Value Date   FERRITIN 98 08/30/2019   IRON 51 08/30/2019   TIBC 339 08/30/2019   UIBC 288 08/30/2019   IRONPCTSAT 15 (L) 08/30/2019   Lab Results  Component Value Date   RETICCTPCT 1.3 11/19/2019   RBC 3.54 (L) 11/19/2019   No results found for: Nils Pyle [facility] Lab Results  Component Value Date   IGGSERUM 789 11/15/2016   IGA 150 11/15/2016   IGMSERUM 86 11/15/2016   No results found for: Odetta Pink, SPEI   Chemistry      Component Value Date/Time   NA 140 08/30/2019 0827   NA 140 05/01/2019 1536   NA 144 04/17/2017 1150   NA 141 04/18/2016 1256   K 4.3 08/30/2019 0827   K 4.6 04/17/2017 1150   K 4.4 04/18/2016  1256   CL 107 08/30/2019 0827   CL 109 (H) 04/17/2017 1150   CO2 21 (L) 08/30/2019 0827   CO2 23 04/17/2017 1150   CO2 18 (L) 04/18/2016 1256   BUN 46 (H) 08/30/2019 0827   BUN 38 (H) 05/01/2019 1536   BUN 28 (H) 04/17/2017 1150   BUN 27.3 (H) 04/18/2016 1256   CREATININE 1.62 (H) 08/30/2019 0827   CREATININE 1.3 (H) 04/17/2017 1150   CREATININE 1.4 (H) 04/18/2016 1256      Component Value Date/Time   CALCIUM 9.9 08/30/2019 0827   CALCIUM 9.6 04/17/2017 1150   CALCIUM 9.4 04/18/2016 1256   ALKPHOS 67 08/30/2019 0827   ALKPHOS  62 04/17/2017 1150   ALKPHOS 68 04/18/2016 1256   AST 17 08/30/2019 0827   AST 14 04/18/2016 1256   ALT 26 08/30/2019 0827   ALT 20 04/17/2017 1150   ALT 23 04/18/2016 1256   BILITOT 0.3 08/30/2019 0827   BILITOT <0.22 04/18/2016 1256       Impression and Plan: Ms. Fulgham is a very pleasant 64 yo caucasian female with history of stage I ductal carcinoma of the left breast diagnosed in 2003.  No Aranesp needed today for Hgb 11.1.  Iron studies are pending. We will replace if needed.  We will see her again in 8 weeks.  She will contact our office with any questions or concerns. We can certainly see her sooner if needed. Laverna Peace, NP 5/18/202111:01 AM   Addendum: I was able to speak with the patient about her BUN and Creatinine which are significantly elevated at this time. Specimen was run twice to confirm. She is asymptomatic at this time and does not want to go to the ED. She plans to follow-up with her urologist Dr. Amalia Hailey and I have called his office and left a voicemail on their triage line explaining the situation and have routed her lab work to his office. We will await their response. Patient advised to go directly to the ED or call EMS if she develops symptoms. She agreed. No other questions at this time.

## 2019-11-19 NOTE — Telephone Encounter (Signed)
Appointments scheduled calendar printed per 5/18 los 

## 2019-11-19 NOTE — Telephone Encounter (Signed)
Jory Ee NP notified of creat-5.50.  Pt.'s urologist notified of creat-5.5 per [place]. [place] NP.  Pt is asymptomatic and is refusing to go to the ER at this time.

## 2019-11-20 ENCOUNTER — Inpatient Hospital Stay (HOSPITAL_COMMUNITY)
Admission: EM | Admit: 2019-11-20 | Discharge: 2019-11-22 | DRG: 683 | Disposition: A | Discharge status: Home / Self Care | Payer: Medicare Other | Attending: Internal Medicine | Admitting: Internal Medicine

## 2019-11-20 DIAGNOSIS — Z20822 Contact with and (suspected) exposure to covid-19: Secondary | ICD-10-CM | POA: Diagnosis present

## 2019-11-20 DIAGNOSIS — Z7984 Long term (current) use of oral hypoglycemic drugs: Secondary | ICD-10-CM | POA: Diagnosis not present

## 2019-11-20 DIAGNOSIS — Z803 Family history of malignant neoplasm of breast: Secondary | ICD-10-CM | POA: Diagnosis not present

## 2019-11-20 DIAGNOSIS — E1122 Type 2 diabetes mellitus with diabetic chronic kidney disease: Secondary | ICD-10-CM | POA: Diagnosis present

## 2019-11-20 DIAGNOSIS — Z853 Personal history of malignant neoplasm of breast: Secondary | ICD-10-CM | POA: Diagnosis not present

## 2019-11-20 DIAGNOSIS — Z7901 Long term (current) use of anticoagulants: Secondary | ICD-10-CM | POA: Diagnosis not present

## 2019-11-20 DIAGNOSIS — I480 Paroxysmal atrial fibrillation: Secondary | ICD-10-CM | POA: Diagnosis present

## 2019-11-20 DIAGNOSIS — Z90722 Acquired absence of ovaries, bilateral: Secondary | ICD-10-CM

## 2019-11-20 DIAGNOSIS — N179 Acute kidney failure, unspecified: Principal | ICD-10-CM

## 2019-11-20 DIAGNOSIS — Z8249 Family history of ischemic heart disease and other diseases of the circulatory system: Secondary | ICD-10-CM | POA: Diagnosis not present

## 2019-11-20 DIAGNOSIS — J3089 Other allergic rhinitis: Secondary | ICD-10-CM | POA: Diagnosis present

## 2019-11-20 DIAGNOSIS — Z888 Allergy status to other drugs, medicaments and biological substances status: Secondary | ICD-10-CM

## 2019-11-20 DIAGNOSIS — Z881 Allergy status to other antibiotic agents status: Secondary | ICD-10-CM

## 2019-11-20 DIAGNOSIS — I5032 Chronic diastolic (congestive) heart failure: Secondary | ICD-10-CM | POA: Diagnosis present

## 2019-11-20 DIAGNOSIS — G8929 Other chronic pain: Secondary | ICD-10-CM | POA: Diagnosis present

## 2019-11-20 DIAGNOSIS — E1169 Type 2 diabetes mellitus with other specified complication: Secondary | ICD-10-CM

## 2019-11-20 DIAGNOSIS — D631 Anemia in chronic kidney disease: Secondary | ICD-10-CM | POA: Diagnosis present

## 2019-11-20 DIAGNOSIS — Z7722 Contact with and (suspected) exposure to environmental tobacco smoke (acute) (chronic): Secondary | ICD-10-CM | POA: Diagnosis present

## 2019-11-20 DIAGNOSIS — Z7952 Long term (current) use of systemic steroids: Secondary | ICD-10-CM

## 2019-11-20 DIAGNOSIS — Z9221 Personal history of antineoplastic chemotherapy: Secondary | ICD-10-CM

## 2019-11-20 DIAGNOSIS — Z79899 Other long term (current) drug therapy: Secondary | ICD-10-CM | POA: Diagnosis not present

## 2019-11-20 DIAGNOSIS — Z887 Allergy status to serum and vaccine status: Secondary | ICD-10-CM

## 2019-11-20 DIAGNOSIS — I420 Dilated cardiomyopathy: Secondary | ICD-10-CM | POA: Diagnosis present

## 2019-11-20 DIAGNOSIS — Z923 Personal history of irradiation: Secondary | ICD-10-CM | POA: Diagnosis not present

## 2019-11-20 DIAGNOSIS — N1831 Chronic kidney disease, stage 3a: Secondary | ICD-10-CM | POA: Diagnosis present

## 2019-11-20 DIAGNOSIS — Z885 Allergy status to narcotic agent status: Secondary | ICD-10-CM

## 2019-11-20 DIAGNOSIS — Z981 Arthrodesis status: Secondary | ICD-10-CM

## 2019-11-20 DIAGNOSIS — I130 Hypertensive heart and chronic kidney disease with heart failure and stage 1 through stage 4 chronic kidney disease, or unspecified chronic kidney disease: Secondary | ICD-10-CM | POA: Diagnosis present

## 2019-11-20 DIAGNOSIS — Z87442 Personal history of urinary calculi: Secondary | ICD-10-CM

## 2019-11-20 DIAGNOSIS — E785 Hyperlipidemia, unspecified: Secondary | ICD-10-CM | POA: Diagnosis present

## 2019-11-20 DIAGNOSIS — I4891 Unspecified atrial fibrillation: Secondary | ICD-10-CM | POA: Diagnosis present

## 2019-11-20 DIAGNOSIS — Z87440 Personal history of urinary (tract) infections: Secondary | ICD-10-CM

## 2019-11-20 DIAGNOSIS — K219 Gastro-esophageal reflux disease without esophagitis: Secondary | ICD-10-CM | POA: Diagnosis present

## 2019-11-20 HISTORY — DX: Acute kidney failure, unspecified: N17.9

## 2019-11-20 LAB — IRON AND TIBC
Iron: 71 ug/dL (ref 41–142)
Saturation Ratios: 23 % (ref 21–57)
TIBC: 305 ug/dL (ref 236–444)
UIBC: 234 ug/dL (ref 120–384)

## 2019-11-20 LAB — URINALYSIS, ROUTINE W REFLEX MICROSCOPIC
Bilirubin Urine: NEGATIVE
Glucose, UA: NEGATIVE mg/dL
Hgb urine dipstick: NEGATIVE
Ketones, ur: NEGATIVE mg/dL
Leukocytes,Ua: NEGATIVE
Nitrite: NEGATIVE
Protein, ur: NEGATIVE mg/dL
Specific Gravity, Urine: 1.012 (ref 1.005–1.030)
pH: 5 (ref 5.0–8.0)

## 2019-11-20 LAB — BASIC METABOLIC PANEL
Anion gap: 14 (ref 5–15)
BUN: 85 mg/dL — ABNORMAL HIGH (ref 8–23)
CO2: 15 mmol/L — ABNORMAL LOW (ref 22–32)
Calcium: 9.3 mg/dL (ref 8.9–10.3)
Chloride: 107 mmol/L (ref 98–111)
Creatinine, Ser: 4.36 mg/dL — ABNORMAL HIGH (ref 0.44–1.00)
GFR calc Af Amer: 12 mL/min — ABNORMAL LOW (ref >60)
GFR calc non Af Amer: 10 mL/min — ABNORMAL LOW (ref >60)
Glucose, Bld: 198 mg/dL — ABNORMAL HIGH (ref 70–99)
Potassium: 4.6 mmol/L (ref 3.5–5.1)
Sodium: 136 mmol/L (ref 135–145)

## 2019-11-20 LAB — FERRITIN: Ferritin: 400 ng/mL — ABNORMAL HIGH (ref 11–307)

## 2019-11-20 LAB — CBC
HCT: 35.2 % — ABNORMAL LOW (ref 36.0–46.0)
Hemoglobin: 11.1 g/dL — ABNORMAL LOW (ref 12.0–15.0)
MCH: 30.9 pg (ref 26.0–34.0)
MCHC: 31.5 g/dL (ref 30.0–36.0)
MCV: 98.1 fL (ref 80.0–100.0)
Platelets: 291 10*3/uL (ref 150–400)
RBC: 3.59 MIL/uL — ABNORMAL LOW (ref 3.87–5.11)
RDW: 13.8 % (ref 11.5–15.5)
WBC: 7.8 10*3/uL (ref 4.0–10.5)
nRBC: 0 % (ref 0.0–0.2)

## 2019-11-20 MED ORDER — SODIUM CHLORIDE 0.9 % IV BOLUS
1000.0000 mL | Freq: Once | INTRAVENOUS | Status: AC
  Administered 2019-11-20: 1000 mL via INTRAVENOUS

## 2019-11-20 NOTE — ED Provider Notes (Signed)
[facility] EMERGENCY DEPARTMENT Provider Note   CSN: [id number] Arrival date & time: 11/20/19  1352     History Chief Complaint  Patient presents with  . Abnormal Lab    [name] is a 64 y.o. female.  The history is provided by the patient and medical records.   64 year old female with history of arthritis, asthma, CHF, chronic back pain, GERD, hyperlipidemia, hypertension, history of recurrent UTI, diabetes, history of stage I breast cancer, presenting to the ED with abnormal labs.  Patient had routine blood draw performed yesterday and was notified today of abnormal renal function.  States she was not told necessarily why this would have happened as her levels have been stable over the past few months.  She does report ongoing diarrhea for several weeks now from her Metformin but that has been unchanged.  The weekend she did have a 24-hour bug where she had a few episodes of vomiting as well.  She has been trying to cut back on soda and drink more water.  She feels like she is urinating a normal amount.  She has not had any recent changes in her medications.  She does not take any large doses of NSAIDs.  Past Medical History:  Diagnosis Date  . Arthritis    "back, knees, arms, wrists" (05/15/2013)  . Asthma   . Breast cancer ([place])   . CHF (congestive heart failure) ([place])    "mild" (05/15/2013)  . Chronic bronchitis ([place])   . Chronic lower back pain   . Dysrhythmia    atrial fib/dr [place] [facility]  . Family history of anesthesia complication    "my mother also had PONV" (05/15/2013)  . GERD (gastroesophageal reflux disease)   . Heart murmur   . High cholesterol   . Kidney stones   . Migraine    "haven't had one in the early 2000's" (05/15/2013)  . Personal history of chemotherapy   . Personal history of radiation therapy   . Pneumonia    "used to be chronic; last time I had it was 2013" (05/15/2013)  . PONV (postoperative nausea and  vomiting)   . Recurrent UTI (urinary tract infection)    "from the continuous kidney stones; take Macrodantin qd" (05/15/2013)  . Sepsis ([place]) 05   kidney stone infection  . Tension headache   . Type II diabetes mellitus [facility])     Patient Active Problem List   Diagnosis Date Noted  . Dilated cardiomyopathy ([place]) 07/16/2019  . 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 C. difficile colitis   . Chronic systolic congestive heart failure ([place])   . Chronic UTI   . Diabetes mellitus type 2 in obese ([place])   . Dysphagia   . Infection   . PAF (paroxysmal atrial fibrillation) ([place])   . Tachypnea   . Hydronephrosis of right kidney   . AMS (altered mental status)   . Septic shock ([place]) 09/23/2018  . DOE (dyspnea on exertion) 07/27/2018  . Anemia, normocytic normochromic 07/27/2018  . Cough variant asthma vs uacs  07/24/2017  . IDA (iron deficiency anemia) 04/19/2017  . Restrictive lung disease 01/18/2017  . Simple chronic bronchitis ([place]) 11/15/2016  . Asthma 11/15/2016  . Chronic non-seasonal allergic rhinitis 11/15/2016  . Hyperlipidemia 11/15/2016  . Nephrolithiasis 11/15/2016  . GERD (gastroesophageal reflux disease) 11/15/2016  . Diarrhea 11/15/2016  . Atrial fibrillation ([place]) 11/15/2016  . Left breast mass 07/19/2013  . History of breast cancer in female  07/19/2013  . Type II or unspecified type diabetes mellitus without mention of complication, not stated as uncontrolled 05/11/2013  . Respiratory failure requiring intubation ([place]) 05/08/2013  . HTN (hypertension) 05/08/2013  . Breast cancer [facility])     Past Surgical History:  Procedure Laterality Date  . 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 BREAST BIOPSY Left 02/2001  . BREAST LUMPECTOMY Left 02/2001  . BREAST LUMPECTOMY Left 09/23/2013   Procedure: LEFT LUMPECTOMY WITH SPECIMEN MAMMOGRAM;  Surgeon: Adin Hector, MD;   Location: [facility];  Service: General;  Laterality: Left;  . COLONOSCOPY    . DIAGNOSTIC LAPAROSCOPY     cyst-near ovary  . LITHOTRIPSY     "2-3 times prior to 2002" (05/15/2013)  . SPINAL FUSION N/A 05/08/2013   Procedure: T9-S1 INSTRUMENTED FUSION T12 -S1 DECOMPRESSION;  Surgeon: Melina Schools, MD;  Location: [facility];  Service: Orthopedics;  Laterality: N/A;  . TUBAL LIGATION Bilateral 1985     OB History   No obstetric history on file.     Family History  Problem Relation Age of Onset  . COPD Mother   . Heart disease Mother   . Breast cancer Mother   . Kidney cancer Father   . Liver cancer Brother   . Environmental Allergies Other   . Lupus Other        niece  . Environmental Allergies Son     Social History   Tobacco Use  . Smoking status: Passive Smoke Exposure - Never Smoker  . Smokeless tobacco: Never Used  . Tobacco comment: Mother & Father smoked  Substance Use Topics  . Alcohol use: No    Alcohol/week: 0.0 standard drinks  . Drug use: No    Home Medications Prior to Admission medications   Medication Sig Start Date End Date Taking? Authorizing Provider  acetaminophen (TYLENOL) 500 MG tablet Take 500 mg every 6 (six) hours as needed by mouth.    [provider]  albuterol (VENTOLIN HFA) 108 (90 Base) MCG/ACT inhaler Inhale 2 puffs into the lungs as needed for wheezing or shortness of breath. 09/02/19   Tanda Rockers, MD  ALPRAZolam Duanne Moron) 0.25 MG tablet Take 0.25 mg by mouth 3 (three) times daily as needed for anxiety.     [provider]  atorvastatin (LIPITOR) 10 MG tablet Take 10 mg by mouth daily at 6 PM.  03/18/15   [provider]  carvedilol (COREG) 6.25 MG tablet Take 6.25 mg by mouth 2 (two) times daily with a meal.  04/01/13   [provider]  ELIQUIS 5 MG TABS tablet Take 1 tablet (5 mg total) by mouth 2 (two) times daily. 10/03/19   Park Liter, MD  flecainide (TAMBOCOR) 50 MG tablet Take 1  tablet (50 mg total) by mouth 2 (two) times daily. 06/25/19   Park Liter, MD  fluticasone (FLONASE) 50 MCG/ACT nasal spray PLACE 1 SPRAY INTO BOTH NOSTRILS 2 (TWO) TIMES DAILY. 02/27/18   Tanda Rockers, MD  furosemide (LASIX) 20 MG tablet Take 20 mg by mouth daily. 07/03/19   [provider]  glipiZIDE (GLUCOTROL XL) 10 MG 24 hr tablet Take 10 mg by mouth daily. 07/11/19   [provider]  glucagon (GLUCAGON EMERGENCY) 1 MG injection Inject 1 mg into the muscle as needed. 03/21/19   [provider]  hydrochlorothiazide (HYDRODIURIL) 12.5 MG tablet Take  12.5 mg by mouth daily. 10/08/19   [provider]  loratadine (CLARITIN) 10 MG tablet Take 10 mg by mouth daily as needed for allergies.    [provider]  metFORMIN (GLUCOPHAGE) 1000 MG tablet Take 1,000 mg by mouth 2 (two) times daily with a meal.    [provider]  montelukast (SINGULAIR) 10 MG tablet One a bedtime 09/02/19   Tanda Rockers, MD  omeprazole (PRILOSEC) 20 MG capsule Take 20 mg by mouth daily.  03/31/13   [provider]  oxybutynin (DITROPAN-XL) 10 MG 24 hr tablet Take 10 mg by mouth daily. 08/02/19   [provider]  predniSONE (DELTASONE) 10 MG tablet Take 1 tablet by mouth as directed. 08/29/19   [provider]  sertraline (ZOLOFT) 50 MG tablet Take 50 mg by mouth daily. 10/08/19   [provider]    Allergies    Ativan [lorazepam], Other, Cleocin [clindamycin hcl], Morphine and related, Pneumococcal vaccines, and Buprenorphine hcl  Review of Systems   Review of Systems  Constitutional:       Abnormal labs  All other systems reviewed and are negative.   Physical Exam Updated Vital Signs BP 119/65 (BP Location: Right Arm)   Pulse 77   Temp 98.4 F (36.9 C) (Oral)   Resp 16   SpO2 99%   Physical Exam Vitals and nursing note reviewed.  Constitutional:      Appearance: She is well-developed.  HENT:     Head: Normocephalic  and atraumatic.  Eyes:     Conjunctiva/sclera: Conjunctivae normal.     Pupils: Pupils are equal, round, and reactive to light.  Cardiovascular:     Rate and Rhythm: Normal rate and regular rhythm.     Heart sounds: Normal heart sounds.  Pulmonary:     Effort: Pulmonary effort is normal.     Breath sounds: Normal breath sounds. No stridor. No wheezing.  Abdominal:     General: Bowel sounds are normal.     Palpations: Abdomen is soft.     Tenderness: There is no abdominal tenderness. There is no rebound.     Comments: Soft, non-tender  Musculoskeletal:        General: Normal range of motion.     Cervical back: Normal range of motion.  Skin:    General: Skin is warm and dry.  Neurological:     Mental Status: She is alert and oriented to person, place, and time.     Comments: AAOx3, moving arms and legs well, ambulatory     ED Results / Procedures / Treatments   Labs (all labs ordered are listed, but only abnormal results are displayed) Labs Reviewed  BASIC METABOLIC PANEL - Abnormal; Notable for the following components:      Result Value   CO2 15 (*)    Glucose, Bld 198 (*)    BUN 85 (*)    Creatinine, Ser 4.36 (*)    GFR calc non Af Amer 10 (*)    GFR calc Af Amer 12 (*)    All other components within normal limits  CBC - Abnormal; Notable for the following components:   RBC 3.59 (*)    Hemoglobin 11.1 (*)    HCT 35.2 (*)    All other components within normal limits  SARS CORONAVIRUS 2 BY RT PCR (HOSPITAL ORDER, [facility])  URINALYSIS, ROUTINE W REFLEX MICROSCOPIC    EKG None  Radiology No results found.  Procedures  Procedures (including critical care time)  Medications Ordered in ED Medications  sodium chloride 0.9 % bolus 1,000 mL (1,000 mLs Intravenous New Bag/Given 11/20/19 2301)    ED Course  I have reviewed the triage vital signs and the nursing notes.  Pertinent labs & imaging results that were available during my care  of the patient were reviewed by me and considered in my medical decision making (see chart for details).    MDM Rules/Calculators/A&P  64 year old female presenting to the ED with acute renal failure seen on outpatient labs performed yesterday.  Today her creatinine is 4.36, baseline is around 1.6.  She does report some ongoing diarrhea from her Metformin and a GI bug over the weekend with some vomiting.  Suspect this may be prerenal from some dehydration given her elevated BUN as well.  No heavy NSAID use.  Will start IV fluids and admit for ongoing care.  She does report having an ultrasound performed at the urology office last week that was negative.  Discussed with hospitalist, Dr. Marlowe Sax-- will admit for ongoing care.  Final Clinical Impression(s) / ED Diagnoses Final diagnoses:  Acute renal failure, unspecified acute renal failure type [facility])    Rx / DC Orders ED Discharge Orders    None       Larene Pickett, PA-C 11/20/19 2313    Davonna Belling, MD 11/22/19 [phone]

## 2019-11-20 NOTE — ED Triage Notes (Signed)
Pt arrives pov with reports of elevated BUN and creatinine levels. Pt only complaint is nausea.

## 2019-11-21 ENCOUNTER — Encounter (HOSPITAL_COMMUNITY): Payer: Self-pay | Admitting: Internal Medicine

## 2019-11-21 ENCOUNTER — Inpatient Hospital Stay (HOSPITAL_COMMUNITY): Payer: Medicare Other

## 2019-11-21 ENCOUNTER — Other Ambulatory Visit: Payer: Self-pay

## 2019-11-21 DIAGNOSIS — N179 Acute kidney failure, unspecified: Principal | ICD-10-CM

## 2019-11-21 LAB — BASIC METABOLIC PANEL
Anion gap: 13 (ref 5–15)
BUN: 79 mg/dL — ABNORMAL HIGH (ref 8–23)
CO2: 16 mmol/L — ABNORMAL LOW (ref 22–32)
Calcium: 8.7 mg/dL — ABNORMAL LOW (ref 8.9–10.3)
Chloride: 110 mmol/L (ref 98–111)
Creatinine, Ser: 3.71 mg/dL — ABNORMAL HIGH (ref 0.44–1.00)
GFR calc Af Amer: 14 mL/min — ABNORMAL LOW (ref >60)
GFR calc non Af Amer: 12 mL/min — ABNORMAL LOW (ref >60)
Glucose, Bld: 125 mg/dL — ABNORMAL HIGH (ref 70–99)
Potassium: 4.6 mmol/L (ref 3.5–5.1)
Sodium: 139 mmol/L (ref 135–145)

## 2019-11-21 LAB — SODIUM, URINE, RANDOM: Sodium, Ur: 84 mmol/L

## 2019-11-21 LAB — GLUCOSE, CAPILLARY
Glucose-Capillary: 121 mg/dL — ABNORMAL HIGH (ref 70–99)
Glucose-Capillary: 102 mg/dL — ABNORMAL HIGH (ref 70–99)
Glucose-Capillary: 148 mg/dL — ABNORMAL HIGH (ref 70–99)
Glucose-Capillary: 172 mg/dL — ABNORMAL HIGH (ref 70–99)

## 2019-11-21 LAB — HIV ANTIBODY (ROUTINE TESTING W REFLEX): HIV Screen 4th Generation wRfx: NONREACTIVE

## 2019-11-21 LAB — HEMOGLOBIN A1C
Hgb A1c MFr Bld: 6.9 % — ABNORMAL HIGH (ref 4.8–5.6)
Mean Plasma Glucose: 151.33 mg/dL

## 2019-11-21 LAB — SARS CORONAVIRUS 2 BY RT PCR (HOSPITAL ORDER, PERFORMED IN ~~LOC~~ HOSPITAL LAB): SARS Coronavirus 2: NEGATIVE

## 2019-11-21 LAB — CREATININE, URINE, RANDOM: Creatinine, Urine: 73.06 mg/dL

## 2019-11-21 MED ORDER — CARVEDILOL 6.25 MG PO TABS
6.2500 mg | ORAL_TABLET | Freq: Two times a day (BID) | ORAL | Status: DC
  Administered 2019-11-21 – 2019-11-22 (×3): 6.25 mg via ORAL
  Filled 2019-11-21 (×3): qty 1

## 2019-11-21 MED ORDER — LORATADINE 10 MG PO TABS: 10.0000 mg | ORAL_TABLET | Freq: Every day | ORAL | Status: DC | PRN

## 2019-11-21 MED ORDER — PANTOPRAZOLE SODIUM 20 MG PO TBEC
20.0000 mg | DELAYED_RELEASE_TABLET | Freq: Every day | ORAL | Status: DC
  Administered 2019-11-21 – 2019-11-22 (×2): 20 mg via ORAL
  Filled 2019-11-21 (×2): qty 1

## 2019-11-21 MED ORDER — ACETAMINOPHEN 325 MG PO TABS: 650.0000 mg | ORAL_TABLET | Freq: Four times a day (QID) | ORAL | Status: DC | PRN

## 2019-11-21 MED ORDER — ASCORBIC ACID 500 MG PO TABS
1000.0000 mg | ORAL_TABLET | Freq: Every day | ORAL | Status: DC
  Administered 2019-11-21 – 2019-11-22 (×2): 1000 mg via ORAL
  Filled 2019-11-21 (×2): qty 2

## 2019-11-21 MED ORDER — ALPRAZOLAM 0.25 MG PO TABS: 0.2500 mg | ORAL_TABLET | Freq: Three times a day (TID) | ORAL | Status: DC | PRN

## 2019-11-21 MED ORDER — ONDANSETRON HCL 4 MG/2ML IJ SOLN: 4.0000 mg | Freq: Four times a day (QID) | INTRAMUSCULAR | Status: DC | PRN

## 2019-11-21 MED ORDER — FLUTICASONE PROPIONATE 50 MCG/ACT NA SUSP
1.0000 | Freq: Two times a day (BID) | NASAL | Status: DC | PRN
  Filled 2019-11-21: qty 16

## 2019-11-21 MED ORDER — CHOLECALCIFEROL 25 MCG (1000 UT) PO CHEW: 2.0000 | CHEWABLE_TABLET | Freq: Every day | ORAL | Status: DC

## 2019-11-21 MED ORDER — FLECAINIDE ACETATE 50 MG PO TABS
50.0000 mg | ORAL_TABLET | Freq: Two times a day (BID) | ORAL | Status: DC
  Administered 2019-11-21 – 2019-11-22 (×3): 50 mg via ORAL
  Filled 2019-11-21 (×4): qty 1

## 2019-11-21 MED ORDER — MONTELUKAST SODIUM 10 MG PO TABS
10.0000 mg | ORAL_TABLET | Freq: Every day | ORAL | Status: DC
  Administered 2019-11-21: 10 mg via ORAL
  Filled 2019-11-21: qty 1

## 2019-11-21 MED ORDER — SODIUM CHLORIDE 0.9 % IV SOLN: INTRAVENOUS | Status: DC

## 2019-11-21 MED ORDER — INSULIN ASPART 100 UNIT/ML ~~LOC~~ SOLN: 0.0000 [IU] | Freq: Every day | SUBCUTANEOUS | Status: DC

## 2019-11-21 MED ORDER — OXYBUTYNIN CHLORIDE ER 10 MG PO TB24
10.0000 mg | ORAL_TABLET | Freq: Every day | ORAL | Status: DC
  Administered 2019-11-21 – 2019-11-22 (×2): 10 mg via ORAL
  Filled 2019-11-21 (×2): qty 1

## 2019-11-21 MED ORDER — SERTRALINE HCL 50 MG PO TABS
50.0000 mg | ORAL_TABLET | Freq: Every day | ORAL | Status: DC
  Administered 2019-11-21 – 2019-11-22 (×2): 50 mg via ORAL
  Filled 2019-11-21 (×2): qty 1

## 2019-11-21 MED ORDER — APIXABAN 5 MG PO TABS
5.0000 mg | ORAL_TABLET | Freq: Two times a day (BID) | ORAL | Status: DC
  Administered 2019-11-21 – 2019-11-22 (×3): 5 mg via ORAL
  Filled 2019-11-21 (×3): qty 1

## 2019-11-21 MED ORDER — VITAMIN D 25 MCG (1000 UNIT) PO TABS
2000.0000 [IU] | ORAL_TABLET | Freq: Every day | ORAL | Status: DC
  Administered 2019-11-21 – 2019-11-22 (×2): 2000 [IU] via ORAL
  Filled 2019-11-21 (×2): qty 2

## 2019-11-21 MED ORDER — ALBUTEROL SULFATE (2.5 MG/3ML) 0.083% IN NEBU: 2.5000 mg | INHALATION_SOLUTION | RESPIRATORY_TRACT | Status: DC | PRN

## 2019-11-21 MED ORDER — INSULIN ASPART 100 UNIT/ML ~~LOC~~ SOLN
0.0000 [IU] | Freq: Three times a day (TID) | SUBCUTANEOUS | Status: DC
  Administered 2019-11-21 – 2019-11-22 (×3): 1 [IU] via SUBCUTANEOUS
  Administered 2019-11-22: 2 [IU] via SUBCUTANEOUS

## 2019-11-21 MED ORDER — ATORVASTATIN CALCIUM 10 MG PO TABS
10.0000 mg | ORAL_TABLET | Freq: Every day | ORAL | Status: DC
  Administered 2019-11-21: 10 mg via ORAL
  Filled 2019-11-21 (×2): qty 1

## 2019-11-21 NOTE — Progress Notes (Signed)
Patient admitted after midnight.  Care began in the ER prior.  Continue IVF with hope for d/c in AM if Cr improved.  Suspect this was pre-renal. JV

## 2019-11-21 NOTE — Progress Notes (Signed)
ANTICOAGULATION CONSULT NOTE - Initial Consult  Pharmacy Consult for apixaban Indication: atrial fibrillation  Allergies  Allergen Reactions  . Ativan [Lorazepam]     Pt states she has taken before with no issue.  . Other Nausea And Vomiting and Swelling    Shrimp if eat a lot  . Cleocin [Clindamycin Hcl] Other (See Comments)    "irritated my esophagus"  . Morphine And Related Nausea And Vomiting  . Pneumococcal Vaccines Other (See Comments)    Really high fever, and flu symptoms. MD instructed not to give  . Buprenorphine Hcl Nausea And Vomiting    Pt does not know if she has ever had this medication    Patient Measurements: Height: 4\' 11"  (149.9 cm) Weight: 68 kg (150 lb) IBW/kg (Calculated) : 43.2  Vital Signs: Temp: 98.3 F (36.8 C) (05/20 0138) Temp Source: Oral (05/20 0138) BP: 135/78 (05/20 0138) Pulse Rate: 77 (05/20 0138)  Labs: Recent Labs    11/19/19 1038 11/20/19 1435  HGB 11.1* 11.1*  HCT 35.2* 35.2*  PLT 304 291  CREATININE 5.50* 4.36*    Estimated Creatinine Clearance: 10.9 mL/min (A) (by C-G formula based on SCr of 4.36 mg/dL (H)).   Medical History: Past Medical History:  Diagnosis Date  . Arthritis    "back, knees, arms, wrists" (05/15/2013)  . Asthma   . Breast cancer ([place])   . CHF (congestive heart failure) ([place])    "mild" (05/15/2013)  . Chronic bronchitis ([place])   . Chronic lower back pain   . Dysrhythmia    atrial fib/dr [place] [facility]  . Family history of anesthesia complication    "my mother also had PONV" (05/15/2013)  . GERD (gastroesophageal reflux disease)   . Heart murmur   . High cholesterol   . Kidney stones   . Migraine    "haven't had one in the early 2000's" (05/15/2013)  . Personal history of chemotherapy   . Personal history of radiation therapy   . Pneumonia    "used to be chronic; last time I had it was 2013" (05/15/2013)  . PONV (postoperative nausea and vomiting)   . Recurrent UTI (urinary  tract infection)    "from the continuous kidney stones; take Macrodantin qd" (05/15/2013)  . Sepsis ([place]) 05   kidney stone infection  . Tension headache   . Type II diabetes mellitus ([facility])     Medications:  Scheduled:  . vitamin C  1,000 mg Oral Daily  . atorvastatin  10 mg Oral q1800  . carvedilol  6.25 mg Oral BID WC  . cholecalciferol  2,000 Units Oral Daily  . flecainide  50 mg Oral BID  . insulin aspart  0-5 Units Subcutaneous QHS  . insulin aspart  0-9 Units Subcutaneous TID WC  . montelukast  10 mg Oral QHS  . pantoprazole  20 mg Oral Daily  . sertraline  50 mg Oral Daily    Assessment: 10 yof with history of atrial fibrillation - on apixaban PTA. Presenting with abnormal renal function, after ongoing diarrhea/vomiting. LD of apixaban on 5/19.   Hgb 11.1, plt 291. Scr elevated but trending down from 5.5 to 4.36 after receiving hydration. No s/sx of bleeding documented.  Given age<80 and wt 86 kg despite elevated Scr, will continue at higher dose of apixaban and monitor uop and Scr trend.   Goal of Therapy:  Monitor platelets by anticoagulation protocol: Yes   Plan:  Continue apixaban 5 mg twice daily  Monitor Scr, wt, CBC,  and for s/sx of bleeding  Antonietta Jewel, PharmD, BCCCP Clinical Pharmacist  11/21/2019 2:18 AM  Please check AMION for all [place] phone numbers After 10:00 PM, call [place] [phone]

## 2019-11-21 NOTE — H&P (Addendum)
History and Physical    [name] UEA:[id number] DOB: 16-Jun-1956 DOA: 11/20/2019  PCP: Enid Skeens., MD Patient coming from: Home  Chief Complaint: Abnormal labs  HPI: [name] is a 64 y.o. female with medical history significant of arthritis, asthma, breast cancer status post chemo and radiation, CHF, chronic lower back pain, A. fib, GERD, hyperlipidemia, CKD stage III, recurrent UTIs, nephrolithiasis, type 2 diabetes presenting to the ED for evaluation of acute renal failure (BUN 89, creatinine 5.5) discovered on outpatient labs performed 11/19/2019.  Patient states she went to see her cancer specialist for regular follow-up and was told her BUN and creatinine were elevated on labs.  She was advised to come into the ED to be evaluated.  States she has a history of kidney stones and is seen by a urologist.  She is also seen by nephrologist.  She was feeling nauseous and did have a few episodes of vomiting 4 days ago.  No further episodes of vomiting since then.  She has also been feeling tired.  Reports no change in her urine output.  She has not noticed any blood in her urine.  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  Denies NSAID use.  Denies fevers, chills, shortness of breath, or chest pain.  Reports having a chronic cough due to allergies and postnasal drip.  ED Course: BUN 85, creatinine 4.3.  Bicarb 15, anion gap 14.  Blood glucose 198.  UA negative.  Screening SARS-CoV-2 PCR test negative.  Patient was given 1 L normal saline bolus.  Review of Systems:  All systems reviewed and apart from history of presenting illness, are negative.  Past Medical History:  Diagnosis Date  . Arthritis    "back, knees, arms, wrists" (05/15/2013)  . Asthma   . Breast cancer ([place])   . CHF (congestive heart failure) ([place])    "mild" (05/15/2013)  . Chronic bronchitis ([place])   . Chronic lower back pain   . Dysrhythmia    atrial fib/dr [place]  cardiology  . Family history of anesthesia complication    "my mother also had PONV" (05/15/2013)  . GERD (gastroesophageal reflux disease)   . Heart murmur   . High cholesterol   . Kidney stones   . Migraine    "haven't had one in the early 2000's" (05/15/2013)  . Personal history of chemotherapy   . Personal history of radiation therapy   . Pneumonia    "used to be chronic; last time I had it was 2013" (05/15/2013)  . PONV (postoperative nausea and vomiting)   . Recurrent UTI (urinary tract infection)    "from the continuous kidney stones; take Macrodantin qd" (05/15/2013)  . Sepsis ([place]) 05   kidney stone infection  . Tension headache   . Type II diabetes mellitus ([place])     Past Surgical History:  Procedure Laterality Date  . 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 BREAST BIOPSY Left 02/2001  . BREAST LUMPECTOMY Left 02/2001  . BREAST LUMPECTOMY Left 09/23/2013   Procedure: LEFT LUMPECTOMY WITH SPECIMEN MAMMOGRAM;  Surgeon: Adin Hector, MD;  Location: [facility];  Service: General;  Laterality: Left;  . COLONOSCOPY    . DIAGNOSTIC LAPAROSCOPY     cyst-near ovary  . LITHOTRIPSY     "2-3 times prior to 2002" (05/15/2013)  . SPINAL FUSION N/A 05/08/2013   Procedure:  T9-S1 INSTRUMENTED FUSION T12 -S1 DECOMPRESSION;  Surgeon: Melina Schools, MD;  Location: [facility];  Service: Orthopedics;  Laterality: N/A;  . TUBAL LIGATION Bilateral 1985     reports that she is a non-smoker but has been exposed to tobacco smoke. She has never used smokeless tobacco. She reports that she does not drink alcohol or use drugs.  Allergies  Allergen Reactions  . Ativan [Lorazepam]     Pt states she has taken before with no issue.  . Other Nausea And Vomiting and Swelling    Shrimp if eat a lot  . Cleocin [Clindamycin Hcl] Other (See Comments)    "irritated my esophagus"  . Morphine And Related  Nausea And Vomiting  . Pneumococcal Vaccines Other (See Comments)    Really high fever, and flu symptoms. MD instructed not to give  . Buprenorphine Hcl Nausea And Vomiting    Pt does not know if she has ever had this medication    Family History  Problem Relation Age of Onset  . COPD Mother   . Heart disease Mother   . Breast cancer Mother   . Kidney cancer Father   . Liver cancer Brother   . Environmental Allergies Other   . Lupus Other        niece  . Environmental Allergies Son     Prior to Admission medications   Medication Sig Start Date End Date Taking? Authorizing Provider  acetaminophen (TYLENOL) 500 MG tablet Take 500 mg by mouth every 6 (six) hours as needed for mild pain or headache.    Yes [provider]  albuterol (VENTOLIN HFA) 108 (90 Base) MCG/ACT inhaler Inhale 2 puffs into the lungs as needed for wheezing or shortness of breath. 09/02/19  Yes Tanda Rockers, MD  ALPRAZolam Duanne Moron) 0.25 MG tablet Take 0.25 mg by mouth 3 (three) times daily as needed for anxiety.    Yes [provider]  Ascorbic Acid (VITAMIN C) 1000 MG tablet Take 1,000 mg by mouth daily.   Yes [provider]  atorvastatin (LIPITOR) 10 MG tablet Take 10 mg by mouth daily at 6 PM.  03/18/15  Yes [provider]  BIOTIN PO Take 2 capsules by mouth daily.   Yes [provider]  carvedilol (COREG) 6.25 MG tablet Take 6.25 mg by mouth 2 (two) times daily with a meal.  04/01/13  Yes [provider]  Cholecalciferol (VITAMIN D3 ADULT GUMMIES) 25 MCG (1000 UT) CHEW Chew 2 each by mouth daily.   Yes [provider]  ELIQUIS 5 MG TABS tablet Take 1 tablet (5 mg total) by mouth 2 (two) times daily. 10/03/19  Yes Park Liter, MD  flecainide (TAMBOCOR) 50 MG tablet Take 1 tablet (50 mg total) by mouth 2 (two) times daily. 06/25/19  Yes Park Liter, MD  fluticasone (FLONASE) 50 MCG/ACT nasal spray PLACE 1 SPRAY INTO BOTH NOSTRILS 2 (TWO)  TIMES DAILY. Patient taking differently: Place 1 spray into both nostrils 2 (two) times daily as needed.  02/27/18  Yes Tanda Rockers, MD  furosemide (LASIX) 20 MG tablet Take 20 mg by mouth daily. 07/03/19  Yes [provider]  glipiZIDE (GLUCOTROL XL) 10 MG 24 hr tablet Take 10 mg by mouth daily. 07/11/19  Yes [provider]  glucagon (GLUCAGON EMERGENCY) 1 MG injection Inject 1 mg into the muscle as needed. 03/21/19  Yes [provider]  loratadine (CLARITIN) 10 MG tablet Take 10 mg by mouth daily as  needed for allergies.   Yes [provider]  metFORMIN (GLUCOPHAGE) 1000 MG tablet Take 1,000 mg by mouth 2 (two) times daily with a meal.   Yes [provider]  montelukast (SINGULAIR) 10 MG tablet One a bedtime Patient taking differently: Take 10 mg by mouth at bedtime.  09/02/19  Yes Tanda Rockers, MD  Multiple Vitamins-Minerals (ZINC PO) Take 1 tablet by mouth daily.   Yes [provider]  omeprazole (PRILOSEC) 20 MG capsule Take 20 mg by mouth daily.  03/31/13  Yes [provider]  oxybutynin (DITROPAN-XL) 10 MG 24 hr tablet Take 10 mg by mouth daily. 08/02/19  Yes [provider]  sertraline (ZOLOFT) 50 MG tablet Take 50 mg by mouth daily. 10/08/19  Yes [provider]    Physical Exam: Vitals:   11/20/19 2009 11/21/19 0120 11/21/19 0138 11/21/19 0454  BP: 119/65 (!) 141/78 135/78 134/80  Pulse: 77 92 77 81  Resp: 16 16 16 17   Temp:  98.6 F (37 C) 98.3 F (36.8 C) 98.3 F (36.8 C)  TempSrc:  Oral Oral Oral  SpO2: 99% 100% 100% 100%  Weight:   68 kg   Height:   4\' 11"  (1.499 m)     Physical Exam  Constitutional: She is oriented to person, place, and time. She appears well-developed and well-nourished. No distress.  HENT:  Head: Normocephalic.  Eyes: Right eye exhibits no discharge. Left eye exhibits no discharge.  Cardiovascular: Normal rate, regular rhythm and intact distal pulses.  Pulmonary/Chest:  Effort normal and breath sounds normal. No respiratory distress. She has no wheezes. She has no rales.  Abdominal: Soft. Bowel sounds are normal. She exhibits no distension. There is no abdominal tenderness. There is no guarding.  Musculoskeletal:        General: No edema.     Cervical back: Neck supple.  Neurological: She is alert and oriented to person, place, and time.  Skin: Skin is warm and dry. She is not diaphoretic.    Labs on Admission: I have personally reviewed following labs and imaging studies  CBC: Recent Labs  Lab 11/19/19 1038 11/20/19 1435  WBC 8.8 7.8  NEUTROABS 5.4  --   HGB 11.1* 11.1*  HCT 35.2* 35.2*  MCV 97.8 98.1  PLT 304 229   Basic Metabolic Panel: Recent Labs  Lab 11/19/19 1038 11/20/19 1435  NA 137 136  K 5.2* 4.6  CL 104 107  CO2 16* 15*  GLUCOSE 188* 198*  BUN 89* 85*  CREATININE 5.50* 4.36*  CALCIUM 9.4 9.3   GFR: Estimated Creatinine Clearance: 10.9 mL/min (A) (by C-G formula based on SCr of 4.36 mg/dL (H)). Liver Function Tests: Recent Labs  Lab 11/19/19 1038  AST 17  ALT 18  ALKPHOS 67  BILITOT 0.3  PROT 7.0  ALBUMIN 4.5   No results for input(s): LIPASE, AMYLASE in the last 168 hours. No results for input(s): AMMONIA in the last 168 hours. Coagulation Profile: No results for input(s): INR, PROTIME in the last 168 hours. Cardiac Enzymes: No results for input(s): CKTOTAL, CKMB, CKMBINDEX, TROPONINI in the last 168 hours. BNP (last 3 results) No results for input(s): PROBNP in the last 8760 hours. HbA1C: No results for input(s): HGBA1C in the last 72 hours. CBG: No results for input(s): GLUCAP in the last 168 hours. Lipid Profile: No results for input(s): CHOL, HDL, LDLCALC, TRIG, CHOLHDL, LDLDIRECT in the last 72 hours. Thyroid Function Tests: No results for input(s): TSH, T4TOTAL, FREET4,  T3FREE, THYROIDAB in the last 72 hours. Anemia Panel: Recent Labs    11/19/19 1038 11/19/19 1039  FERRITIN 400*  --   TIBC  305  --   IRON 71  --   RETICCTPCT  --  1.3   Urine analysis:    Component Value Date/Time   COLORURINE STRAW (A) 11/20/2019 2233   APPEARANCEUR CLEAR 11/20/2019 2233   LABSPEC 1.012 11/20/2019 2233   Parole 5.0 11/20/2019 [place] 11/20/2019 2233   HGBUR NEGATIVE 11/20/2019 2233   BILIRUBINUR NEGATIVE 11/20/2019 2233   KETONESUR NEGATIVE 11/20/2019 2233   PROTEINUR NEGATIVE 11/20/2019 2233   UROBILINOGEN 0.2 04/17/2012 1905   NITRITE NEGATIVE 11/20/2019 2233   LEUKOCYTESUR NEGATIVE 11/20/2019 2233    Radiological Exams on Admission: US RENAL  Result Date: 11/21/2019 CLINICAL DATA:  Initial evaluation for acute renal insufficiency. EXAM: RENAL / URINARY TRACT ULTRASOUND COMPLETE COMPARISON:  Prior renal ultrasound from 11/05/2019. FINDINGS: Right Kidney: Renal measurements: 12.0 x 3.9 x 4.1 cm = volume: 98.8 mL. Echogenicity within normal limits. No nephrolithiasis. Mild pelviectasis without hydronephrosis. No focal renal mass. Left Kidney: Renal measurements: 11.0 x 5.4 x 3.3 cm = volume: 104.3 mL. Echogenicity within normal limits. No nephrolithiasis. Mild pelviectasis without overt hydronephrosis. No focal renal mass. Bladder: Appears normal for degree of bladder distention. Other: Incidental note made of a dense echogenic echotexture of the liver, suggesting steatosis or hepatocellular disease. IMPRESSION: 1. Mild bilateral pelviectasis without overt hydronephrosis. Renal echogenicity within normal limits. 2. Dense echogenic echotexture of the liver, suggesting steatosis or other intrinsic hepatocellular disease. Electronically Signed   By: Jeannine Boga M.D.   On: 11/21/2019 04:35    Assessment/Plan Principal Problem:   AKI (acute kidney injury) ([place]) Active Problems:   Hyperlipidemia   GERD (gastroesophageal reflux disease)   Atrial fibrillation ([place])   Diabetes mellitus type 2 in obese ([place])   AKI on CKD stage III: Suspect prerenal in setting of  chronic diarrhea from Metformin use and vomiting a few days ago.  In addition, takes Lasix at home which is also likely contributing.  BUN 85, creatinine 4.3.  Baseline creatinine around 1.6.  Renal ultrasound showing mild bilateral pelviectasis without overt hydronephrosis; no nephrolithiasis reported. -Continue IV fluid hydration.  Monitor renal function and urine output.  Check urine sodium and creatinine.  Avoid nephrotoxic agents/contrast.  A. fib: Stable.  Continue home Eliquis, flecainide, and Coreg.  Hyperlipidemia: Continue home Lipitor.  Non-insulin-dependent type 2 diabetes: Check A1c.  Sliding scale insulin sensitive ACHS and CBG checks.  GERD: Continue PPI  DVT prophylaxis: Eliquis Code Status: Full code Family Communication: No family available this time. Disposition Plan: Status is: Inpatient  Remains inpatient appropriate because:IV treatments appropriate due to intensity of illness or inability to take PO   Dispo: The patient is from: Home              Anticipated d/c is to: Home              Anticipated d/c date is: 3 days              Patient currently is not medically stable to d/c.  The medical decision making on this patient was of high complexity and the patient is at high risk for clinical deterioration, therefore this is a level 3 visit.  Shela Leff MD [facility]  If 7PM-7AM, please contact night-coverage [email or web address]  11/21/2019, 5:34 AM

## 2019-11-21 NOTE — TOC Initial Note (Signed)
Transition of Care [facility]) - Initial/Assessment Note    Patient Details  Name: [name] MRN: [id number] Date of Birth: December 20, 1955  [facility] CM/SW Contact:    Marilu Favre, RN Phone Number: 11/21/2019, 1:20 PM  Clinical Narrative:                 Patient from home with husband. Confirmed face sheet information, active with PCP.    Patient has walker, shower chair at home from prior illness, prior to admission she was not using DME.   Expected Discharge Plan: Home/Self Care Barriers to Discharge: Continued Medical Work up   Patient Goals and CMS Choice Patient states their goals for this hospitalization and ongoing recovery are:: to return to home CMS Medicare.gov Compare Post Acute Care list provided to:: Patient Choice offered to / list presented to : NA  Expected Discharge Plan and Services Expected Discharge Plan: Home/Self Care   Discharge Planning Services: CM Consult   Living arrangements for the past 2 months: Single Family Home                 DME Arranged: N/A DME Agency: NA       HH Arranged: NA          Prior Living Arrangements/Services Living arrangements for the past 2 months: Single Family Home Lives with:: Spouse Patient language and need for interpreter reviewed:: Yes Do you feel safe going back to the place where you live?: Yes      Need for Family Participation in Patient Care: Yes (Comment) Care giver support system in place?: Yes (comment) Current home services: DME Criminal Activity/Legal Involvement Pertinent to Current Situation/Hospitalization: No - Comment as needed  Activities of Daily Living Home Assistive Devices/Equipment: CBG Meter ADL Screening (condition at time of admission) Patient's cognitive ability adequate to safely complete daily activities?: Yes Is the patient deaf or have difficulty hearing?: No Does the patient have difficulty seeing, even when wearing glasses/contacts?: No Does the  patient have difficulty concentrating, remembering, or making decisions?: No Patient able to express need for assistance with ADLs?: Yes Does the patient have difficulty dressing or bathing?: No Independently performs ADLs?: Yes (appropriate for developmental age) Does the patient have difficulty walking or climbing stairs?: No Weakness of Legs: None Weakness of Arms/Hands: None  Permission Sought/Granted   Permission granted to share information with : No              Emotional Assessment Appearance:: Appears stated age Attitude/Demeanor/Rapport: Engaged   Orientation: : Oriented to Situation, Oriented to  Time, Oriented to Place, Oriented to Self Alcohol / Substance Use: Not Applicable Psych Involvement: No (comment)  Admission diagnosis:  AKI (acute kidney injury) ([place]) [N17.9] Acute renal failure, unspecified acute renal failure type ([place]) [N17.9] Patient Active Problem List   Diagnosis Date Noted  . AKI (acute kidney injury) ([place]) 11/20/2019  . Dilated cardiomyopathy ([place]) 07/16/2019  . 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 C. difficile colitis   . Chronic systolic congestive heart failure ([place])   . Chronic UTI   . Diabetes mellitus type 2 in obese ([place])   . Dysphagia   . Infection   . PAF (paroxysmal atrial fibrillation) ([place])   . Tachypnea   . Hydronephrosis of right kidney   . AMS (altered mental status)   . Septic shock ([place]) 09/23/2018  . DOE (dyspnea on exertion) 07/27/2018  . Anemia, normocytic normochromic 07/27/2018  . Cough variant asthma vs  uacs  07/24/2017  . IDA (iron deficiency anemia) 04/19/2017  . Restrictive lung disease 01/18/2017  . Simple chronic bronchitis ([place]) 11/15/2016  . Asthma 11/15/2016  . Chronic non-seasonal allergic rhinitis 11/15/2016  . Hyperlipidemia 11/15/2016  . Nephrolithiasis 11/15/2016  . GERD (gastroesophageal reflux disease) 11/15/2016  . Diarrhea 11/15/2016  . Atrial fibrillation ([place]) 11/15/2016  .  Left breast mass 07/19/2013  . History of breast cancer in female 07/19/2013  . Type II or unspecified type diabetes mellitus without mention of complication, not stated as uncontrolled 05/11/2013  . Respiratory failure requiring intubation ([place]) 05/08/2013  . HTN (hypertension) 05/08/2013  . Breast cancer ([place])    PCP:  Enid Skeens., MD Pharmacy:   [id number] - [place], [place] - [address] [place] [address] Phone: [phone] Fax: [phone]     Social Determinants of Health (SDOH) Interventions    Readmission Risk Interventions Readmission Risk Prevention Plan 09/26/2018 09/26/2018  Transportation Screening Complete (No Data)  [place] or [place] Not Complete -  HRI or Home Care Consult comments Pending progression - based on PTA status HCC not needed -  Social Work Consult for [place] Planning/Counseling Complete -  Palliative Care Screening Not Complete -  Palliative Care Screening Not Complete Comments vented -  Some recent data might be hidden

## 2019-11-22 LAB — BASIC METABOLIC PANEL
Anion gap: 11 (ref 5–15)
Anion gap: 10 (ref 5–15)
BUN: 66 mg/dL — ABNORMAL HIGH (ref 8–23)
BUN: 59 mg/dL — ABNORMAL HIGH (ref 8–23)
CO2: 15 mmol/L — ABNORMAL LOW (ref 22–32)
CO2: 17 mmol/L — ABNORMAL LOW (ref 22–32)
Calcium: 8.6 mg/dL — ABNORMAL LOW (ref 8.9–10.3)
Calcium: 8.7 mg/dL — ABNORMAL LOW (ref 8.9–10.3)
Chloride: 113 mmol/L — ABNORMAL HIGH (ref 98–111)
Chloride: 111 mmol/L (ref 98–111)
Creatinine, Ser: 2.82 mg/dL — ABNORMAL HIGH (ref 0.44–1.00)
Creatinine, Ser: 2.75 mg/dL — ABNORMAL HIGH (ref 0.44–1.00)
GFR calc Af Amer: 20 mL/min — ABNORMAL LOW (ref >60)
GFR calc Af Amer: 20 mL/min — ABNORMAL LOW (ref >60)
GFR calc non Af Amer: 17 mL/min — ABNORMAL LOW (ref >60)
GFR calc non Af Amer: 18 mL/min — ABNORMAL LOW (ref >60)
Glucose, Bld: 214 mg/dL — ABNORMAL HIGH (ref 70–99)
Glucose, Bld: 217 mg/dL — ABNORMAL HIGH (ref 70–99)
Potassium: 4.7 mmol/L (ref 3.5–5.1)
Potassium: 4 mmol/L (ref 3.5–5.1)
Sodium: 139 mmol/L (ref 135–145)
Sodium: 138 mmol/L (ref 135–145)

## 2019-11-22 LAB — CBC
HCT: 30.6 % — ABNORMAL LOW (ref 36.0–46.0)
Hemoglobin: 9.8 g/dL — ABNORMAL LOW (ref 12.0–15.0)
MCH: 31.3 pg (ref 26.0–34.0)
MCHC: 32 g/dL (ref 30.0–36.0)
MCV: 97.8 fL (ref 80.0–100.0)
Platelets: 243 10*3/uL (ref 150–400)
RBC: 3.13 MIL/uL — ABNORMAL LOW (ref 3.87–5.11)
RDW: 14 % (ref 11.5–15.5)
WBC: 6.4 10*3/uL (ref 4.0–10.5)
nRBC: 0 % (ref 0.0–0.2)

## 2019-11-22 LAB — GLUCOSE, CAPILLARY
Glucose-Capillary: 139 mg/dL — ABNORMAL HIGH (ref 70–99)
Glucose-Capillary: 183 mg/dL — ABNORMAL HIGH (ref 70–99)

## 2019-11-22 NOTE — Discharge Instructions (Signed)
Acute Kidney Injury, Adult  Acute kidney injury is a sudden worsening of kidney function. The kidneys are organs that have several jobs. They filter the blood to remove waste products and extra fluid. They also maintain a healthy balance of minerals and hormones in the body, which helps control blood pressure and keep bones strong. With this condition, your kidneys do not do their jobs as well as they should. This condition ranges from mild to severe. Over time it may develop into long-lasting (chronic) kidney disease. Early detection and treatment may prevent acute kidney injury from developing into a chronic condition. What are the causes? Common causes of this condition include:  A problem with blood flow to the kidneys. This may be caused by: ? Low blood pressure (hypotension) or shock. ? Blood loss. ? Heart and blood vessel (cardiovascular) disease. ? Severe burns. ? Liver disease.  Direct damage to the kidneys. This may be caused by: ? Certain medicines. ? A kidney infection. ? Poisoning. ? Being around or in contact with toxic substances. ? A surgical wound. ? A hard, direct hit to the kidney area.  A sudden blockage of urine flow. This may be caused by: ? Cancer. ? Kidney stones. ? An enlarged prostate in males. What are the signs or symptoms? Symptoms of this condition may not be obvious until the condition becomes severe. Symptoms of this condition can include:  Tiredness (lethargy), or difficulty staying awake.  Nausea or vomiting.  Swelling (edema) of the face, legs, ankles, or feet.  Problems with urination, such as: ? Abdominal pain, or pain along the side of your stomach (flank). ? Decreased urine production. ? Decrease in the force of urine flow.  Muscle twitches and cramps, especially in the legs.  Confusion or trouble concentrating.  Loss of appetite.  Fever. How is this diagnosed? This condition may be diagnosed with tests, including:  Blood  tests.  Urine tests.  Imaging tests.  A test in which a sample of tissue is removed from the kidneys to be examined under a microscope (kidney biopsy). How is this treated? Treatment for this condition depends on the cause and how severe the condition is. In mild cases, treatment may not be needed. The kidneys may heal on their own. In more severe cases, treatment will involve:  Treating the cause of the kidney injury. This may involve changing any medicines you are taking or adjusting your dosage.  Fluids. You may need specialized IV fluids to balance your body's needs.  Having a catheter placed to drain urine and prevent blockages.  Preventing problems from occurring. This may mean avoiding certain medicines or procedures that can cause further injury to the kidneys. In some cases treatment may also require:  A procedure to remove toxic wastes from the body (dialysis or continuous renal replacement therapy - CRRT).  Surgery. This may be done to repair a torn kidney, or to remove the blockage from the urinary system. Follow these instructions at home: Medicines  Take over-the-counter and prescription medicines only as told by your health care provider.  Do not take any new medicines without your health care provider's approval. Many medicines can worsen your kidney damage.  Do not take any vitamin and mineral supplements without your health care provider's approval. Many nutritional supplements can worsen your kidney damage. Lifestyle  If your health care provider prescribed changes to your diet, follow them. You may need to decrease the amount of protein you eat.  Achieve and maintain a  healthy weight. If you need help with this, ask your health care provider.  Start or continue an exercise plan. Try to exercise at least 30 minutes a day, 5 days a week.  Do not use any tobacco products, such as cigarettes, chewing tobacco, and e-cigarettes. If you need help quitting, ask your  health care provider. General instructions  Keep track of your blood pressure. Report changes in your blood pressure as told by your health care provider.  Stay up to date with immunizations. Ask your health care provider which immunizations you need.  Keep all follow-up visits as told by your health care provider. This is important. Where to find more information  American Association of Kidney Patients: BombTimer.gl  National Kidney Foundation: www.kidney.[place]: https://mathis.com/  [facility]: ? www.lifeoptions.org ? www.kidneyschool.org Contact a health care provider if:  Your symptoms get worse.  You develop new symptoms. Get help right away if:  You develop symptoms of worsening kidney disease, which include: ? Headaches. ? Abnormally dark or light skin. ? Easy bruising. ? Frequent hiccups. ? Chest pain. ? Shortness of breath. ? End of menstruation in women. ? Seizures. ? Confusion or altered mental status. ? Abdominal or back pain. ? Itchiness.  You have a fever.  Your body is producing less urine.  You have pain or bleeding when you urinate. Summary  Acute kidney injury is a sudden worsening of kidney function.  Acute kidney injury can be caused by problems with blood flow to the kidneys, direct damage to the kidneys, and sudden blockage of urine flow.  Symptoms of this condition may not be obvious until it becomes severe. Symptoms may include edema, lethargy, confusion, nausea or vomiting, and problems passing urine.  This condition can usually be diagnosed with blood tests, urine tests, and imaging tests. Sometimes a kidney biopsy is done to diagnose this condition.  Treatment for this condition often involves treating the underlying cause. It is treated with fluids, medicines, dialysis, diet changes, or surgery. This information is not intended to replace advice given to you by your health care provider. Make  sure you discuss any questions you have with your health care provider. Document Revised: 06/02/2017 Document Reviewed: 06/10/2016 Elsevier Patient Education  2020 [place] on my medicine - ELIQUIS (apixaban)  This medication education was reviewed with me or my healthcare representative as part of my discharge preparation.  The pharmacist that spoke with me during my hospital stay was:  Onnie Boer, RPH-CPP  Why was Eliquis prescribed for you? Eliquis was prescribed for you to reduce the risk of a blood clot forming that can cause a stroke if you have a medical condition called atrial fibrillation (a type of irregular heartbeat).  What do You need to know about Eliquis ? Take your Eliquis TWICE DAILY - one tablet in the morning and one tablet in the evening with or without food. If you have difficulty swallowing the tablet whole please discuss with your pharmacist how to take the medication safely.  Take Eliquis exactly as prescribed by your doctor and DO NOT stop taking Eliquis without talking to the doctor who prescribed the medication.  Stopping may increase your risk of developing a stroke.  Refill your prescription before you run out.  After discharge, you should have regular check-up appointments with your healthcare provider that is prescribing your Eliquis.  In the future your dose may need to be changed if your kidney function or weight changes by  a significant amount or as you get older.  What do you do if you miss a dose? If you miss a dose, take it as soon as you remember on the same day and resume taking twice daily.  Do not take more than one dose of ELIQUIS at the same time to make up a missed dose.  Important Safety Information A possible side effect of Eliquis is bleeding. You should call your healthcare provider right away if you experience any of the following: ? Bleeding from an injury or your nose that does not stop. ? Unusual colored urine (red  or dark brown) or unusual colored stools (red or black). ? Unusual bruising for unknown reasons. ? A serious fall or if you hit your head (even if there is no bleeding).  Some medicines may interact with Eliquis and might increase your risk of bleeding or clotting while on Eliquis. To help avoid this, consult your healthcare provider or pharmacist prior to using any new prescription or non-prescription medications, including herbals, vitamins, non-steroidal anti-inflammatory drugs (NSAIDs) and supplements.  This website has more information on Eliquis (apixaban): http://www.eliquis.com/eliquis/home

## 2019-11-22 NOTE — Discharge Summary (Signed)
Physician Discharge Summary  [name] IOM:[id number] DOB: 19-Jul-1955 DOA: 11/20/2019  PCP: Enid Skeens., MD  Admit date: 11/20/2019 Discharge date: 11/22/2019  Admitted From: Home Discharge disposition: Home   Recommendations for Outpatient Follow-Up:   1. BMP on Monday or Tuesday   Discharge Diagnosis:   Principal Problem:   AKI (acute kidney injury) ([place]) Active Problems:   Hyperlipidemia   GERD (gastroesophageal reflux disease)   Atrial fibrillation ([place])   Diabetes mellitus type 2 in obese [facility])    Discharge Condition: Improved.  Diet recommendation: Low sodium, heart healthy.  Carbohydrate-modified`  Wound care: None.  Code status: Full.   History of Present Illness:   [name] is a 64 y.o. female with medical history significant of arthritis, asthma, breast cancer status post chemo and radiation, CHF, chronic lower back pain, A. fib, GERD, hyperlipidemia, CKD stage III, recurrent UTIs, nephrolithiasis, type 2 diabetes presenting to the ED for evaluation of acute renal failure (BUN 89, creatinine 5.5) discovered on outpatient labs performed 11/19/2019.  Patient states she went to see her cancer specialist for regular follow-up and was told her BUN and creatinine were elevated on labs.  She was advised to come into the ED to be evaluated.  States she has a history of kidney stones and is seen by a urologist.  She is also seen by nephrologist.  She was feeling nauseous and did have a few episodes of vomiting 4 days ago.  No further episodes of vomiting since then.  She has also been feeling tired.  Reports no change in her urine output.  She has not noticed any blood in her urine.  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  Denies NSAID use.  Denies fevers, chills, shortness of breath, or chest pain.  Reports having a chronic cough due to allergies and postnasal drip.    Hospital Course by Problem:   AKI on CKD stage III:  Suspect prerenal in setting of chronic diarrhea from Metformin use and vomiting a few days ago.  In addition, takes Lasix at home which is also likely contributing.    Creatinine peaked at 5.5 and has been slowly trending down - Renal ultrasound showing mild bilateral pelviectasis without overt hydronephrosis; no nephrolithiasis reported. - Avoid nephrotoxic agents/contrast -Patient urinating well, patient eating well, will discharge patient home to follow-up closely with PCP for continued monitoring of her creatinine.  Have held her Metformin as well as her Lasix for now  A. fib: Stable.  Continue home Eliquis, flecainide, and Coreg.  Hyperlipidemia: Continue home Lipitor.  Non-insulin-dependent type 2 diabetes:  -Metformin until creatinine has recovered  GERD: Continue PPI    Medical Consultants:      Discharge Exam:   Vitals:   11/22/19 0528 11/22/19 1246  BP: 134/77 126/71  Pulse: 81 83  Resp: 18 18  Temp: 98.3 F (36.8 C) 98.3 F (36.8 C)  SpO2: 97% 96%   Vitals:   11/21/19 1700 11/21/19 2348 11/22/19 0528 11/22/19 1246  BP:  126/74 134/77 126/71  Pulse:  74 81 83  Resp:  _0 Temp: 98.2 F (36.8 C) 98.6 F (37 C) 98.3 F (36.8 C) 98.3 F (36.8 C)  TempSrc:  Oral Oral Oral  SpO2:  98% 97% 96%  Weight:      Height:        General exam: Appears calm and comfortable.    The results of significant diagnostics from this  hospitalization (including imaging, microbiology, ancillary and laboratory) are listed below for reference.     Procedures and Diagnostic Studies:   US RENAL  Result Date: 11/21/2019 CLINICAL DATA:  Initial evaluation for acute renal insufficiency. EXAM: RENAL / URINARY TRACT ULTRASOUND COMPLETE COMPARISON:  Prior renal ultrasound from 11/05/2019. FINDINGS: Right Kidney: Renal measurements: 12.0 x 3.9 x 4.1 cm = volume: 98.8 mL. Echogenicity within normal limits. No nephrolithiasis. Mild pelviectasis without hydronephrosis. No focal  renal mass. Left Kidney: Renal measurements: 11.0 x 5.4 x 3.3 cm = volume: 104.3 mL. Echogenicity within normal limits. No nephrolithiasis. Mild pelviectasis without overt hydronephrosis. No focal renal mass. Bladder: Appears normal for degree of bladder distention. Other: Incidental note made of a dense echogenic echotexture of the liver, suggesting steatosis or hepatocellular disease. IMPRESSION: 1. Mild bilateral pelviectasis without overt hydronephrosis. Renal echogenicity within normal limits. 2. Dense echogenic echotexture of the liver, suggesting steatosis or other intrinsic hepatocellular disease. Electronically Signed   By: Jeannine Boga M.D.   On: 11/21/2019 04:35     Labs:   Basic Metabolic Panel: Recent Labs  Lab 11/19/19 1038 11/19/19 1038 11/20/19 1435 11/20/19 1435 11/21/19 0552 11/21/19 0552 11/22/19 0305 11/22/19 1459  NA 137  --  136  --  139  --  139 138  K 5.2*   < > 4.6   < > 4.6   < > 4.7 4.0  CL 104  --  107  --  110  --  113* 111  CO2 16*  --  15*  --  16*  --  15* 17*  GLUCOSE 188*  --  198*  --  125*  --  214* 217*  BUN 89*  --  85*  --  79*  --  66* 59*  CREATININE 5.50*  --  4.36*  --  3.71*  --  2.82* 2.75*  CALCIUM 9.4  --  9.3  --  8.7*  --  8.6* 8.7*   < > = values in this interval not displayed.   GFR Estimated Creatinine Clearance: 17.3 mL/min (A) (by C-G formula based on SCr of 2.75 mg/dL (H)). Liver Function Tests: Recent Labs  Lab 11/19/19 1038  AST 17  ALT 18  ALKPHOS 67  BILITOT 0.3  PROT 7.0  ALBUMIN 4.5   No results for input(s): LIPASE, AMYLASE in the last 168 hours. No results for input(s): AMMONIA in the last 168 hours. Coagulation profile No results for input(s): INR, PROTIME in the last 168 hours.  CBC: Recent Labs  Lab 11/19/19 1038 11/20/19 1435 11/22/19 0305  WBC 8.8 7.8 6.4  NEUTROABS 5.4  --   --   HGB 11.1* 11.1* 9.8*  HCT 35.2* 35.2* 30.6*  MCV 97.8 98.1 97.8  PLT 304 291 243   Cardiac Enzymes: No  results for input(s): CKTOTAL, CKMB, CKMBINDEX, TROPONINI in the last 168 hours. BNP: Invalid input(s): POCBNP CBG: Recent Labs  Lab 11/21/19 1107 11/21/19 1631 11/21/19 2110 11/22/19 0538 11/22/19 1100  GLUCAP 102* 148* 172* 139* 183*   D-Dimer No results for input(s): DDIMER in the last 72 hours. Hgb A1c Recent Labs    11/21/19 0552  HGBA1C 6.9*   Lipid Profile No results for input(s): CHOL, HDL, LDLCALC, TRIG, CHOLHDL, LDLDIRECT in the last 72 hours. Thyroid function studies No results for input(s): TSH, T4TOTAL, T3FREE, THYROIDAB in the last 72 hours.  Invalid input(s): FREET3 Anemia work up No results for input(s): VITAMINB12, FOLATE, FERRITIN, TIBC, IRON, RETICCTPCT in the last  72 hours. Microbiology Recent Results (from the past 240 hour(s))  SARS Coronavirus 2 by RT PCR (hospital order, performed in [facility] hospital lab) Nasopharyngeal Nasopharyngeal Swab     Status: None   Collection Time: 11/20/19 11:01 PM   Specimen: Nasopharyngeal Swab  Result Value Ref Range Status   SARS Coronavirus 2 NEGATIVE NEGATIVE Final    Comment: (NOTE) SARS-CoV-2 target nucleic acids are NOT DETECTED. The SARS-CoV-2 RNA is generally detectable in upper and lower respiratory specimens during the acute phase of infection. The lowest concentration of SARS-CoV-2 viral copies this assay can detect is 250 copies / mL. A negative result does not preclude SARS-CoV-2 infection and should not be used as the sole basis for treatment or other patient management decisions.  A negative result may occur with improper specimen collection / handling, submission of specimen other than nasopharyngeal swab, presence of viral mutation(s) within the areas targeted by this assay, and inadequate number of viral copies (<250 copies / mL). A negative result must be combined with clinical observations, patient history, and epidemiological information. Fact Sheet for Patients:     StrictlyIdeas.no Fact Sheet for Healthcare Providers: BankingDealers.co.za This test is not yet approved or cleared  by the [place] FDA and has been authorized for detection and/or diagnosis of SARS-CoV-2 by FDA under an Emergency Use Authorization (EUA).  This EUA will remain in effect (meaning this test can be used) for the duration of the COVID-19 declaration under Section 564(b)(1) of the Act, 21 U.S.C. section 360bbb-3(b)(1), unless the authorization is terminated or revoked sooner. Performed at [facility], [place] [address] [place], [place] [address]      Discharge Instructions:   Discharge Instructions    Diet - low sodium heart healthy   Complete by: As directed    Diet Carb Modified   Complete by: As directed    Discharge instructions   Complete by: As directed    Holding lasix and metformin until your kidney function returns to baseline BMP Monday/Tuesday with PCP Monitor urine output Drink plenty of fluid/stay out of the heat   Increase activity slowly   Complete by: As directed      Allergies as of 11/22/2019      Reactions   Ativan [lorazepam]    Pt states she has taken before with no issue.   Other Nausea And Vomiting, Swelling   Shrimp if eat a lot   Cleocin [clindamycin Hcl] Other (See Comments)   "irritated my esophagus"   Morphine And Related Nausea And Vomiting   Pneumococcal Vaccines Other (See Comments)   Really high fever, and flu symptoms. MD instructed not to give   Buprenorphine Hcl Nausea And Vomiting   Pt does not know if she has ever had this medication      Medication List    STOP taking these medications   furosemide 20 MG tablet Commonly known as: LASIX   metFORMIN 1000 MG tablet Commonly known as: GLUCOPHAGE     TAKE these medications   acetaminophen 500 MG tablet Commonly known as: TYLENOL Take 500 mg by mouth every 6 (six) hours as needed for mild pain or  headache.   albuterol 108 (90 Base) MCG/ACT inhaler Commonly known as: VENTOLIN HFA Inhale 2 puffs into the lungs as needed for wheezing or shortness of breath.   ALPRAZolam 0.25 MG tablet Commonly known as: XANAX Take 0.25 mg by mouth 3 (three) times daily as needed for anxiety.   atorvastatin 10 MG  tablet Commonly known as: LIPITOR Take 10 mg by mouth daily at 6 PM.   BIOTIN PO Take 2 capsules by mouth daily.   carvedilol 6.25 MG tablet Commonly known as: COREG Take 6.25 mg by mouth 2 (two) times daily with a meal.   Eliquis 5 MG Tabs tablet Generic drug: apixaban Take 1 tablet (5 mg total) by mouth 2 (two) times daily.   flecainide 50 MG tablet Commonly known as: TAMBOCOR Take 1 tablet (50 mg total) by mouth 2 (two) times daily.   fluticasone 50 MCG/ACT nasal spray Commonly known as: FLONASE PLACE 1 SPRAY INTO BOTH NOSTRILS 2 (TWO) TIMES DAILY. What changed:   when to take this  reasons to take this   glipiZIDE 10 MG 24 hr tablet Commonly known as: GLUCOTROL XL Take 10 mg by mouth daily.   Glucagon Emergency 1 MG Kit Inject 1 mg into the muscle as needed.   loratadine 10 MG tablet Commonly known as: CLARITIN Take 10 mg by mouth daily as needed for allergies.   montelukast 10 MG tablet Commonly known as: SINGULAIR One a bedtime What changed:   how much to take  how to take this  when to take this  additional instructions   omeprazole 20 MG capsule Commonly known as: PRILOSEC Take 20 mg by mouth daily.   oxybutynin 10 MG 24 hr tablet Commonly known as: DITROPAN-XL Take 10 mg by mouth daily.   sertraline 50 MG tablet Commonly known as: ZOLOFT Take 50 mg by mouth daily.   vitamin C 1000 MG tablet Take 1,000 mg by mouth daily.   Vitamin D3 Adult Gummies 25 MCG (1000 UT) Chew Generic drug: Cholecalciferol Chew 2 each by mouth daily.   ZINC PO Take 1 tablet by mouth daily.      Follow-up Information    [name], [name]., MD Follow up  in 1 week(s).   Specialty: Family Medicine Why: BMP on Monday/Tuesday Contact information: [address] [place] [address] [phone]            Time coordinating discharge: 35 minutes  Signed:  Geradine Girt DO  [facility] 11/22/2019, 3:52 PM

## 2019-11-22 NOTE — Progress Notes (Signed)
RN gave pt dsicharge instructions and she stated understanding. IV has been removed and pt is getting dressed and packing her belongings. Husband at bedside for pickup. No new medications escribed to home pharmacy. Pt alert and not in any pain.

## 2019-12-10 ENCOUNTER — Other Ambulatory Visit: Payer: Self-pay | Admitting: Internal Medicine

## 2019-12-24 ENCOUNTER — Other Ambulatory Visit: Payer: Self-pay | Admitting: Cardiology

## 2019-12-26 ENCOUNTER — Other Ambulatory Visit: Payer: Self-pay

## 2019-12-26 ENCOUNTER — Ambulatory Visit: Payer: Federal, State, Local not specified - PPO | Admitting: Cardiology

## 2019-12-26 ENCOUNTER — Encounter: Payer: Self-pay | Admitting: Cardiology

## 2019-12-26 VITALS — BP 110/62 | HR 107 | Ht 59.0 in | Wt 154.0 lb

## 2019-12-26 DIAGNOSIS — I10 Essential (primary) hypertension: Secondary | ICD-10-CM

## 2019-12-26 DIAGNOSIS — I420 Dilated cardiomyopathy: Secondary | ICD-10-CM

## 2019-12-26 DIAGNOSIS — I480 Paroxysmal atrial fibrillation: Secondary | ICD-10-CM

## 2019-12-26 NOTE — Progress Notes (Signed)
Cardiology Office Note:    Date:  12/26/2019   ID:  [name], DOB 23-Dec-1955, MRN [id number]  PCP:  Enid Skeens., MD  Cardiologist:  Jenne Campus, MD    Referring MD: Enid Skeens., MD   Chief Complaint  Patient presents with  . Follow-up    3 MO FU   Doing well now  History of Present Illness:    [name] is a 64 y.o. female with past medical history significant for cardiomyopathy with normalization, paroxysmal atrial fibrillation, chronic renal failure.  I seen her last time because she had episode of atrial fibrillation after she was giving steroids what happened this time though she ended going to the hospital because of creatinine being 5.5.  Quite extensive evaluation has been done and she was told to have a problem with dehydration and this would like to lose significant increased creatinine.  Luckily after appropriate management creatinine almost normalized she remember last number being 1.6.  Luckily, cardiac wise doing well.  Denies have any chest pain, tightness, pressure, burning in the chest.  She thinks she may need to be more Lasix because she said her baby size is increased somewhat but weight did not change significantly.  Past Medical History:  Diagnosis Date  . Arthritis    "back, knees, arms, wrists" (05/15/2013)  . Asthma   . Breast cancer ([place])   . CHF (congestive heart failure) ([place])    "mild" (05/15/2013)  . Chronic bronchitis ([place])   . Chronic lower back pain   . Dysrhythmia    atrial fib/dr [facility]  . Family history of anesthesia complication    "my mother also had PONV" (05/15/2013)  . GERD (gastroesophageal reflux disease)   . Heart murmur   . High cholesterol   . Kidney stones   . Migraine    "haven't had one in the early 2000's" (05/15/2013)  . Personal history of chemotherapy   . Personal history of radiation therapy   . Pneumonia    "used to be chronic; last time I had it was 2013"  (05/15/2013)  . PONV (postoperative nausea and vomiting)   . Recurrent UTI (urinary tract infection)    "from the continuous kidney stones; take Macrodantin qd" (05/15/2013)  . Sepsis ([place]) 05   kidney stone infection  . Tension headache   . Type II diabetes mellitus ([place])     Past Surgical History:  Procedure Laterality Date  . 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 BREAST BIOPSY Left 02/2001  . BREAST LUMPECTOMY Left 02/2001  . BREAST LUMPECTOMY Left 09/23/2013   Procedure: LEFT LUMPECTOMY WITH SPECIMEN MAMMOGRAM;  Surgeon: Adin Hector, MD;  Location: [facility];  Service: General;  Laterality: Left;  . COLONOSCOPY    . DIAGNOSTIC LAPAROSCOPY     cyst-near ovary  . LITHOTRIPSY     "2-3 times prior to 2002" (05/15/2013)  . SPINAL FUSION N/A 05/08/2013   Procedure: T9-S1 INSTRUMENTED FUSION T12 -S1 DECOMPRESSION;  Surgeon: Melina Schools, MD;  Location: [facility];  Service: Orthopedics;  Laterality: N/A;  . TUBAL LIGATION Bilateral 1985    Current Medications: Current Meds  Medication Sig  . acetaminophen (TYLENOL) 500 MG tablet Take 500 mg by mouth every 6 (six) hours as needed for mild pain or headache.   . albuterol (VENTOLIN HFA) 108 (90 Base) MCG/ACT inhaler Inhale 2 puffs  into the lungs as needed for wheezing or shortness of breath.  . ALPRAZolam (XANAX) 0.25 MG tablet Take 0.25 mg by mouth 3 (three) times daily as needed for anxiety.   . Ascorbic Acid (VITAMIN C) 1000 MG tablet Take 1,000 mg by mouth daily.  Marland Kitchen atorvastatin (LIPITOR) 10 MG tablet Take 10 mg by mouth daily at 6 PM.   . BIOTIN PO Take 2 capsules by mouth daily.  . carvedilol (COREG) 6.25 MG tablet Take 6.25 mg by mouth 2 (two) times daily with a meal.   . Cholecalciferol (VITAMIN D3 ADULT GUMMIES) 25 MCG (1000 UT) CHEW Chew 2 each by mouth daily.  Marland Kitchen ELIQUIS 5 MG TABS tablet Take 1 tablet (5 mg total) by mouth  2 (two) times daily.  . flecainide (TAMBOCOR) 50 MG tablet TAKE 1 TABLET BY MOUTH TWICE A DAY  . fluticasone (FLONASE) 50 MCG/ACT nasal spray PLACE 1 SPRAY INTO BOTH NOSTRILS 2 (TWO) TIMES DAILY.  Marland Kitchen glipiZIDE (GLUCOTROL XL) 10 MG 24 hr tablet Take 10 mg by mouth daily.  Marland Kitchen glucagon (GLUCAGON EMERGENCY) 1 MG injection Inject 1 mg into the muscle as needed.  . loratadine (CLARITIN) 10 MG tablet Take 10 mg by mouth daily as needed for allergies.  . montelukast (SINGULAIR) 10 MG tablet One a bedtime (Patient taking differently: Take 10 mg by mouth at bedtime. )  . Multiple Vitamins-Minerals (ZINC PO) Take 1 tablet by mouth daily.  Marland Kitchen omeprazole (PRILOSEC) 20 MG capsule Take 20 mg by mouth daily.   Marland Kitchen oxybutynin (DITROPAN-XL) 10 MG 24 hr tablet Take 10 mg by mouth daily.  . sertraline (ZOLOFT) 50 MG tablet Take 50 mg by mouth daily.     Allergies:   Ativan [lorazepam], Other, Cleocin [clindamycin hcl], Morphine and related, Pneumococcal vaccines, and Buprenorphine hcl   Social History   Socioeconomic History  . Marital status: Married    Spouse name: Not on file  . Number of children: Not on file  . Years of education: Not on file  . Highest education level: Not on file  Occupational History  . Not on file  Tobacco Use  . Smoking status: Passive Smoke Exposure - Never Smoker  . Smokeless tobacco: Never Used  . Tobacco comment: Mother & Father smoked  Vaping Use  . Vaping Use: Never used  Substance and Sexual Activity  . Alcohol use: No    Alcohol/week: 0.0 standard drinks  . Drug use: No  . Sexual activity: Yes  Other Topics Concern  . Not on file  Social History Narrative   [facility] (11/15/16):   Patient's originally from [place]. Has always lived in [place]. Currently has an outside dog. Remote exposure to [place] when her children were young. No known mold exposure. 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    Social  Determinants of Health   Financial Resource Strain:   . Difficulty of Paying Living Expenses:   Food Insecurity:   . Worried About Charity fundraiser in the Last Year:   . Arboriculturist in the Last Year:   Transportation Needs:   . Film/video editor (Medical):   Marland Kitchen Lack of Transportation (Non-Medical):   Physical Activity:   . Days of Exercise per Week:   . Minutes of Exercise per Session:   Stress:   . Feeling of Stress :   Social Connections:   . Frequency of Communication with Friends and Family:   .  Frequency of Social Gatherings with Friends and Family:   . Attends Religious Services:   . Active Member of Clubs or Organizations:   . Attends Archivist Meetings:   Marland Kitchen Marital Status:      Family History: The patient's family history includes Breast cancer in her mother; COPD in her mother; Environmental Allergies in her son and another family member; Heart disease in her mother; Kidney cancer in her father; Liver cancer in her brother; Lupus in an other family member. ROS:   Please see the history of present illness.    All 14 point review of systems negative except as described per history of present illness  EKGs/Labs/Other Studies Reviewed:      Recent Labs: 11/19/2019: ALT 18 11/22/2019: BUN 59; Creatinine, Ser 2.75; Hemoglobin 9.8; Platelets 243; Potassium 4.0; Sodium 138  Recent Lipid Panel    Component Value Date/Time   TRIG 155 (H) 05/08/2013 2023    Physical Exam:    VS:  BP 110/62 (BP Location: Right Arm, Patient Position: Sitting, Cuff Size: Normal)   Pulse (!) 107   Ht 4\' 11"  (1.499 m)   Wt 154 lb (69.9 kg)   SpO2 91%   BMI 31.10 kg/m     Wt Readings from Last 3 Encounters:  12/26/19 154 lb (69.9 kg)  11/21/19 150 lb (68 kg)  11/19/19 153 lb (69.4 kg)     GEN:  Well nourished, well developed in no acute distress HEENT: Normal NECK: No JVD; No carotid bruits LYMPHATICS: No lymphadenopathy CARDIAC: RRR, no murmurs, no rubs, no  gallops RESPIRATORY:  Clear to auscultation without rales, wheezing or rhonchi  ABDOMEN: Soft, non-tender, non-distended MUSCULOSKELETAL:  No edema; No deformity  SKIN: Warm and dry LOWER EXTREMITIES: no swelling NEUROLOGIC:  Alert and oriented x 3 PSYCHIATRIC:  Normal affect   ASSESSMENT:    1. PAF (paroxysmal atrial fibrillation) ([place])   2. Essential hypertension   3. Dilated cardiomyopathy ([place])    PLAN:    In order of problems listed above:  1. Paroxysmal atrial fibrillation denies having any palpitations.  She is on Eliquis I will continue.  She is also on beta-blocker.  Flecainide 50 mg twice daily is also given.  We will continue this management. 2. Essential hypertension blood pressure 110/62 which is excellent we will continue present management. 3. History of dilated cardiomyopathy with normalization.  I will continue present management, will avoid ACE inhibitor or ARB or Entresto because of kidney dysfunction. 4. I see her we will schedule her to have echocardiogram to reassess left ventricle ejection fraction. 5. I did review record from admission with dehydration creatinine 5.5.   Medication Adjustments/Labs and Tests Ordered: Current medicines are reviewed at length with the patient today.  Concerns regarding medicines are outlined above.  No orders of the defined types were placed in this encounter.  Medication changes: No orders of the defined types were placed in this encounter.   Signed, Park Liter, MD, [facility] 12/26/2019 2:41 PM    [place]

## 2019-12-26 NOTE — Patient Instructions (Signed)

## 2020-01-13 ENCOUNTER — Ambulatory Visit (INDEPENDENT_AMBULATORY_CARE_PROVIDER_SITE_OTHER): Payer: Federal, State, Local not specified - PPO

## 2020-01-13 ENCOUNTER — Other Ambulatory Visit: Payer: Self-pay

## 2020-01-13 DIAGNOSIS — I420 Dilated cardiomyopathy: Secondary | ICD-10-CM | POA: Diagnosis not present

## 2020-01-13 NOTE — Progress Notes (Signed)
Complete echocardiogram has been performed.  Jimmy Stpehen Petitjean RDCS, RVT 

## 2020-01-21 ENCOUNTER — Other Ambulatory Visit: Payer: Self-pay

## 2020-01-21 ENCOUNTER — Inpatient Hospital Stay: Payer: Federal, State, Local not specified - PPO | Attending: Hematology & Oncology

## 2020-01-21 ENCOUNTER — Encounter: Payer: Self-pay | Admitting: Family

## 2020-01-21 ENCOUNTER — Inpatient Hospital Stay (HOSPITAL_BASED_OUTPATIENT_CLINIC_OR_DEPARTMENT_OTHER): Payer: Federal, State, Local not specified - PPO | Admitting: Family

## 2020-01-21 ENCOUNTER — Inpatient Hospital Stay: Payer: Federal, State, Local not specified - PPO

## 2020-01-21 VITALS — BP 121/74 | HR 80 | Temp 98.5°F | Resp 18 | Ht 59.0 in | Wt 155.1 lb

## 2020-01-21 DIAGNOSIS — D649 Anemia, unspecified: Secondary | ICD-10-CM | POA: Diagnosis not present

## 2020-01-21 DIAGNOSIS — D631 Anemia in chronic kidney disease: Secondary | ICD-10-CM

## 2020-01-21 DIAGNOSIS — Z853 Personal history of malignant neoplasm of breast: Secondary | ICD-10-CM | POA: Diagnosis not present

## 2020-01-21 DIAGNOSIS — N183 Chronic kidney disease, stage 3 unspecified: Secondary | ICD-10-CM

## 2020-01-21 DIAGNOSIS — D508 Other iron deficiency anemias: Secondary | ICD-10-CM

## 2020-01-21 DIAGNOSIS — D509 Iron deficiency anemia, unspecified: Secondary | ICD-10-CM | POA: Insufficient documentation

## 2020-01-21 LAB — CMP (CANCER CENTER ONLY)
ALT: 18 U/L (ref 0–44)
AST: 17 U/L (ref 15–41)
Albumin: 4.8 g/dL (ref 3.5–5.0)
Alkaline Phosphatase: 87 U/L (ref 38–126)
Anion gap: 9 (ref 5–15)
BUN: 37 mg/dL — ABNORMAL HIGH (ref 8–23)
CO2: 24 mmol/L (ref 22–32)
Calcium: 10 mg/dL (ref 8.9–10.3)
Chloride: 109 mmol/L (ref 98–111)
Creatinine: 1.28 mg/dL — ABNORMAL HIGH (ref 0.44–1.00)
GFR, Est AFR Am: 51 mL/min — ABNORMAL LOW (ref >60)
GFR, Estimated: 44 mL/min — ABNORMAL LOW (ref >60)
Glucose, Bld: 178 mg/dL — ABNORMAL HIGH (ref 70–99)
Potassium: 4.6 mmol/L (ref 3.5–5.1)
Sodium: 142 mmol/L (ref 135–145)
Total Bilirubin: 0.3 mg/dL (ref 0.3–1.2)
Total Protein: 7.7 g/dL (ref 6.5–8.1)

## 2020-01-21 LAB — CBC WITH DIFFERENTIAL (CANCER CENTER ONLY)
Abs Immature Granulocytes: 0.08 10*3/uL — ABNORMAL HIGH (ref 0.00–0.07)
Basophils Absolute: 0.1 10*3/uL (ref 0.0–0.1)
Basophils Relative: 1 %
Eosinophils Absolute: 0.3 10*3/uL (ref 0.0–0.5)
Eosinophils Relative: 3 %
HCT: 34.7 % — ABNORMAL LOW (ref 36.0–46.0)
Hemoglobin: 11.1 g/dL — ABNORMAL LOW (ref 12.0–15.0)
Immature Granulocytes: 1 %
Lymphocytes Relative: 28 %
Lymphs Abs: 2.2 10*3/uL (ref 0.7–4.0)
MCH: 31.3 pg (ref 26.0–34.0)
MCHC: 32 g/dL (ref 30.0–36.0)
MCV: 97.7 fL (ref 80.0–100.0)
Monocytes Absolute: 0.5 10*3/uL (ref 0.1–1.0)
Monocytes Relative: 6 %
Neutro Abs: 4.8 10*3/uL (ref 1.7–7.7)
Neutrophils Relative %: 61 %
Platelet Count: 224 10*3/uL (ref 150–400)
RBC: 3.55 MIL/uL — ABNORMAL LOW (ref 3.87–5.11)
RDW: 13.7 % (ref 11.5–15.5)
WBC Count: 7.9 10*3/uL (ref 4.0–10.5)
nRBC: 0 % (ref 0.0–0.2)

## 2020-01-21 LAB — RETICULOCYTES
Immature Retic Fract: 15.9 % (ref 2.3–15.9)
RBC.: 3.57 MIL/uL — ABNORMAL LOW (ref 3.87–5.11)
Retic Count, Absolute: 81.4 10*3/uL (ref 19.0–186.0)
Retic Ct Pct: 2.3 % (ref 0.4–3.1)

## 2020-01-21 MED ORDER — DARBEPOETIN ALFA 300 MCG/0.6ML IJ SOSY
PREFILLED_SYRINGE | INTRAMUSCULAR | Status: AC
  Filled 2020-01-21: qty 0.6

## 2020-01-21 NOTE — Progress Notes (Signed)
Hematology and Oncology Follow Up Visit  ESTEFANIA KAMIYA [id number] 10/23/1955 64 y.o. 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 Vohs is here today for follow-up. She is doing well but still has some mild fatigue.  Hgb is 11.1, MCV 97, platelets 224 and WBC count 7.9. BUN 37 and Creatinine 1.28.  She states that she is a night owl and stays up later than she probably should.  She has some SOB with over exertion at times and will take a break to rest when needed.  No fever, chills, n/v, cough, rash, dizziness, chest pain, palpitations, abdominal pain or changes in bowel or bladder habits.  She has not noted any episodes of bleeding. No bruising or petechiae.  No swelling in her extremities at this time.  She has numbness and tingling in her fingers and toes which she states is due to chronic neck and back/hip issues. She states that it is time for her to get another injection in her neck.  She has maintained a good appetite and is staying well hydrated. Her weight is stable.   ECOG Performance Status: 1 - Symptomatic but completely ambulatory  Medications:  Allergies as of 01/21/2020      Reactions   Ativan [lorazepam]    Pt states she has taken before with no issue.   Other Nausea And Vomiting, Swelling   Shrimp if eat a lot   Cleocin [clindamycin Hcl] Other (See Comments)   "irritated my esophagus"   Morphine And Related Nausea And Vomiting   Pneumococcal Vaccines Other (See Comments)   Really high fever, and flu symptoms. MD instructed not to give   Buprenorphine Hcl Nausea And Vomiting   Pt does not know if she has ever had this medication      Medication List       Accurate as of January 21, 2020 11:24 AM. If  you have any questions, ask your nurse or doctor.        acetaminophen 500 MG tablet Commonly known as: TYLENOL Take 500 mg by mouth every 6 (six) hours as needed for mild pain or headache.   albuterol 108 (90 Base) MCG/ACT inhaler Commonly known as: VENTOLIN HFA Inhale 2 puffs into the lungs as needed for wheezing or shortness of breath.   ALPRAZolam 0.25 MG tablet Commonly known as: XANAX Take 0.25 mg by mouth 3 (three) times daily as needed for anxiety.   atorvastatin 10 MG tablet Commonly known as: LIPITOR Take 10 mg by mouth daily at 6 PM.   BIOTIN PO Take 2 capsules by mouth daily.   carvedilol 6.25 MG tablet Commonly known as: COREG Take 6.25 mg by mouth 2 (two) times daily with a meal.   Eliquis 5 MG Tabs tablet Generic drug: apixaban Take 1 tablet (5 mg total) by mouth 2 (two) times daily.   flecainide 50 MG tablet Commonly known as: TAMBOCOR TAKE 1 TABLET BY MOUTH TWICE A DAY   fluticasone 50 MCG/ACT nasal spray Commonly known as: FLONASE PLACE 1 SPRAY INTO BOTH NOSTRILS 2 (TWO) TIMES DAILY.   glipiZIDE 10 MG 24 hr tablet Commonly known as: GLUCOTROL XL Take 10 mg by mouth daily.   Glucagon Emergency 1 MG Kit Inject 1 mg into the muscle  as needed.   loratadine 10 MG tablet Commonly known as: CLARITIN Take 10 mg by mouth daily as needed for allergies.   montelukast 10 MG tablet Commonly known as: SINGULAIR One a bedtime What changed:   how much to take  how to take this  when to take this  additional instructions   omeprazole 20 MG capsule Commonly known as: PRILOSEC Take 20 mg by mouth daily.   oxybutynin 10 MG 24 hr tablet Commonly known as: DITROPAN-XL Take 10 mg by mouth daily.   sertraline 50 MG tablet Commonly known as: ZOLOFT Take 50 mg by mouth daily.   vitamin C 1000 MG tablet Take 1,000 mg by mouth daily.   Vitamin D3 Adult Gummies 25 MCG (1000 UT) Chew Generic drug: Cholecalciferol Chew 2 each by mouth daily.   ZINC  PO Take 1 tablet by mouth daily.       Allergies:  Allergies  Allergen Reactions  . Ativan [Lorazepam]     Pt states she has taken before with no issue.  . Other Nausea And Vomiting and Swelling    Shrimp if eat a lot  . Cleocin [Clindamycin Hcl] Other (See Comments)    "irritated my esophagus"  . Morphine And Related Nausea And Vomiting  . Pneumococcal Vaccines Other (See Comments)    Really high fever, and flu symptoms. MD instructed not to give  . Buprenorphine Hcl Nausea And Vomiting    Pt does not know if she has ever had this medication    Past Medical History, Surgical history, Social history, and Family History were reviewed and updated.  Review of Systems: All other 10 point review of systems is negative.   Physical Exam:  vitals were not taken for this visit.   Wt Readings from Last 3 Encounters:  12/26/19 154 lb (69.9 kg)  11/21/19 150 lb (68 kg)  11/19/19 153 lb (69.4 kg)    Ocular: Sclerae unicteric, pupils equal, round and reactive to light Ear-nose-throat: Oropharynx clear, dentition fair Lymphatic: No cervical or supraclavicular adenopathy Lungs no rales or rhonchi, good excursion bilaterally Heart regular rate and rhythm, no murmur appreciated Abd soft, nontender, positive bowel sounds, no liver or spleen tip palpated on exam, no fluid wave  MSK no focal spinal tenderness, no joint edema Neuro: non-focal, well-oriented, appropriate affect Breasts: Deferred   Lab Results  Component Value Date   WBC 7.9 01/21/2020   HGB 11.1 (L) 01/21/2020   HCT 34.7 (L) 01/21/2020   MCV 97.7 01/21/2020   PLT 224 01/21/2020   Lab Results  Component Value Date   FERRITIN 400 (H) 11/19/2019   IRON 71 11/19/2019   TIBC 305 11/19/2019   UIBC 234 11/19/2019   IRONPCTSAT 23 11/19/2019   Lab Results  Component Value Date   RETICCTPCT 2.3 01/21/2020   RBC 3.57 (L) 01/21/2020   RBC 3.55 (L) 01/21/2020   No results found for: Nils Pyle  [facility] Lab Results  Component Value Date   IGGSERUM 789 11/15/2016   IGA 150 11/15/2016   IGMSERUM 86 11/15/2016   No results found for: Ronnald Ramp, A1GS, Nelida Meuse, SPEI   Chemistry      Component Value Date/Time   NA 138 11/22/2019 1459   NA 140 05/01/2019 1536   NA 144 04/17/2017 1150   NA 141 04/18/2016 1256   K 4.0 11/22/2019 1459   K 4.6 04/17/2017 1150   K 4.4 04/18/2016 1256   CL 111 11/22/2019 1459  CL 109 (H) 04/17/2017 1150   CO2 17 (L) 11/22/2019 1459   CO2 23 04/17/2017 1150   CO2 18 (L) 04/18/2016 1256   BUN 59 (H) 11/22/2019 1459   BUN 38 (H) 05/01/2019 1536   BUN 28 (H) 04/17/2017 1150   BUN 27.3 (H) 04/18/2016 1256   CREATININE 2.75 (H) 11/22/2019 1459   CREATININE 5.50 (HH) 11/19/2019 1038   CREATININE 1.3 (H) 04/17/2017 1150   CREATININE 1.4 (H) 04/18/2016 1256      Component Value Date/Time   CALCIUM 8.7 (L) 11/22/2019 1459   CALCIUM 9.6 04/17/2017 1150   CALCIUM 9.4 04/18/2016 1256   ALKPHOS 67 11/19/2019 1038   ALKPHOS 62 04/17/2017 1150   ALKPHOS 68 04/18/2016 1256   AST 17 11/19/2019 1038   AST 14 04/18/2016 1256   ALT 18 11/19/2019 1038   ALT 20 04/17/2017 1150   ALT 23 04/18/2016 1256   BILITOT 0.3 11/19/2019 1038   BILITOT <0.22 04/18/2016 1256       Impression and Plan: Ms. Haft is a very pleasant 64 yo caucasian female with history of stage I ductal carcinoma of the left breast diagnosed in 2003 and so far there has been no evidence of recurrence. She also has history of multifactorial anemia.  No Aranesp needed today for Hgb 11.1.  Iron studies are pending. We will replace if needed.  We will see her again in 8 weeks.  She will contact our office with any questions or concerns.   Laverna Peace, NP 7/20/202111:24 AM

## 2020-01-22 LAB — IRON AND TIBC
Iron: 70 ug/dL (ref 41–142)
Saturation Ratios: 24 % (ref 21–57)
TIBC: 286 ug/dL (ref 236–444)
UIBC: 216 ug/dL (ref 120–384)

## 2020-01-22 LAB — FERRITIN: Ferritin: 233 ng/mL (ref 11–307)

## 2020-01-29 DIAGNOSIS — D6869 Other thrombophilia: Secondary | ICD-10-CM

## 2020-01-29 HISTORY — DX: Other thrombophilia: D68.69

## 2020-03-17 ENCOUNTER — Inpatient Hospital Stay: Payer: Medicare Other | Admitting: Family

## 2020-03-17 ENCOUNTER — Inpatient Hospital Stay: Payer: Medicare Other

## 2020-03-28 ENCOUNTER — Other Ambulatory Visit: Payer: Self-pay | Admitting: Cardiology

## 2020-03-30 ENCOUNTER — Other Ambulatory Visit: Payer: Self-pay | Admitting: Hematology & Oncology

## 2020-03-30 DIAGNOSIS — Z1231 Encounter for screening mammogram for malignant neoplasm of breast: Secondary | ICD-10-CM

## 2020-03-30 NOTE — Telephone Encounter (Signed)
Prescription refill request for Eliquis received. Indication:  Atrial Fibrillation Last office visit: 12/2019 Agustin Cree Scr: 1.28  01/2020 Age: 64 Weight:  70.4 kg  Prescription refilled

## 2020-04-16 ENCOUNTER — Ambulatory Visit
Admission: RE | Admit: 2020-04-16 | Discharge: 2020-04-16 | Disposition: A | Discharge status: Home / Self Care | Payer: Medicare Other | Source: Ambulatory Visit | Attending: Hematology & Oncology | Admitting: Hematology & Oncology

## 2020-04-16 ENCOUNTER — Telehealth: Payer: Self-pay | Admitting: Family

## 2020-04-16 ENCOUNTER — Encounter: Payer: Self-pay | Admitting: Family

## 2020-04-16 ENCOUNTER — Inpatient Hospital Stay (HOSPITAL_BASED_OUTPATIENT_CLINIC_OR_DEPARTMENT_OTHER): Payer: Medicare Other | Admitting: Family

## 2020-04-16 ENCOUNTER — Other Ambulatory Visit: Payer: Self-pay

## 2020-04-16 ENCOUNTER — Ambulatory Visit: Payer: Federal, State, Local not specified - PPO

## 2020-04-16 ENCOUNTER — Inpatient Hospital Stay: Payer: Medicare Other

## 2020-04-16 ENCOUNTER — Inpatient Hospital Stay: Payer: Medicare Other | Attending: Hematology & Oncology

## 2020-04-16 VITALS — BP 134/69 | HR 85 | Temp 98.6°F | Resp 20 | Ht 59.0 in | Wt 156.8 lb

## 2020-04-16 DIAGNOSIS — N189 Chronic kidney disease, unspecified: Secondary | ICD-10-CM | POA: Diagnosis present

## 2020-04-16 DIAGNOSIS — Z853 Personal history of malignant neoplasm of breast: Secondary | ICD-10-CM | POA: Insufficient documentation

## 2020-04-16 DIAGNOSIS — Z1231 Encounter for screening mammogram for malignant neoplasm of breast: Secondary | ICD-10-CM

## 2020-04-16 DIAGNOSIS — D508 Other iron deficiency anemias: Secondary | ICD-10-CM | POA: Diagnosis not present

## 2020-04-16 DIAGNOSIS — N183 Chronic kidney disease, stage 3 unspecified: Secondary | ICD-10-CM | POA: Diagnosis not present

## 2020-04-16 DIAGNOSIS — D631 Anemia in chronic kidney disease: Secondary | ICD-10-CM | POA: Insufficient documentation

## 2020-04-16 LAB — CMP (CANCER CENTER ONLY)
ALT: 27 U/L (ref 0–44)
AST: 27 U/L (ref 15–41)
Albumin: 4.2 g/dL (ref 3.5–5.0)
Alkaline Phosphatase: 81 U/L (ref 38–126)
Anion gap: 11 (ref 5–15)
BUN: 30 mg/dL — ABNORMAL HIGH (ref 8–23)
CO2: 23 mmol/L (ref 22–32)
Calcium: 9.6 mg/dL (ref 8.9–10.3)
Chloride: 103 mmol/L (ref 98–111)
Creatinine: 1.33 mg/dL — ABNORMAL HIGH (ref 0.44–1.00)
GFR, Estimated: 42 mL/min — ABNORMAL LOW (ref >60)
Glucose, Bld: 276 mg/dL — ABNORMAL HIGH (ref 70–99)
Potassium: 4.1 mmol/L (ref 3.5–5.1)
Sodium: 137 mmol/L (ref 135–145)
Total Bilirubin: 0.3 mg/dL (ref 0.3–1.2)
Total Protein: 6.6 g/dL (ref 6.5–8.1)

## 2020-04-16 LAB — CBC WITH DIFFERENTIAL (CANCER CENTER ONLY)
Abs Immature Granulocytes: 0.02 10*3/uL (ref 0.00–0.07)
Basophils Absolute: 0 10*3/uL (ref 0.0–0.1)
Basophils Relative: 1 %
Eosinophils Absolute: 0.2 10*3/uL (ref 0.0–0.5)
Eosinophils Relative: 3 %
HCT: 32 % — ABNORMAL LOW (ref 36.0–46.0)
Hemoglobin: 10.3 g/dL — ABNORMAL LOW (ref 12.0–15.0)
Immature Granulocytes: 0 %
Lymphocytes Relative: 19 %
Lymphs Abs: 1.4 10*3/uL (ref 0.7–4.0)
MCH: 31.2 pg (ref 26.0–34.0)
MCHC: 32.2 g/dL (ref 30.0–36.0)
MCV: 97 fL (ref 80.0–100.0)
Monocytes Absolute: 0.4 10*3/uL (ref 0.1–1.0)
Monocytes Relative: 6 %
Neutro Abs: 5.3 10*3/uL (ref 1.7–7.7)
Neutrophils Relative %: 71 %
Platelet Count: 211 10*3/uL (ref 150–400)
RBC: 3.3 MIL/uL — ABNORMAL LOW (ref 3.87–5.11)
RDW: 13 % (ref 11.5–15.5)
WBC Count: 7.4 10*3/uL (ref 4.0–10.5)
nRBC: 0 % (ref 0.0–0.2)

## 2020-04-16 LAB — RETICULOCYTES
Immature Retic Fract: 21 % — ABNORMAL HIGH (ref 2.3–15.9)
RBC.: 3.29 MIL/uL — ABNORMAL LOW (ref 3.87–5.11)
Retic Count, Absolute: 98 10*3/uL (ref 19.0–186.0)
Retic Ct Pct: 3 % (ref 0.4–3.1)

## 2020-04-16 MED ORDER — DARBEPOETIN ALFA 300 MCG/0.6ML IJ SOSY
PREFILLED_SYRINGE | INTRAMUSCULAR | Status: AC
  Filled 2020-04-16: qty 0.6

## 2020-04-16 MED ORDER — DARBEPOETIN ALFA 300 MCG/0.6ML IJ SOSY
300.0000 ug | PREFILLED_SYRINGE | Freq: Once | INTRAMUSCULAR | Status: AC
  Administered 2020-04-16: 300 ug via SUBCUTANEOUS

## 2020-04-16 NOTE — Progress Notes (Signed)
Hematology and Oncology Follow Up Visit  LYRAH BRADT [id number] 10-Jan-1956 64 y.o. 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 Barriere is here today for follow-up and Aranesp. Hgb is 10.3, MCV 97.  She is doing well but notes fatigue quite a bit as well as SOB with exertion.  She takes a break to rest when needed.  No blood loss to report. No bruising or petechiae.  No fever, chills, n/v, cough, rash, dizziness, chest pain, palpitations, abdominal pain/bloating or changes in bowel or bladder habits.  She has occasional twinges of discomfort near the umbilicus. No pain at this time.  She has numbness and tingling in her hands due to chronic neck issues.  She states that she has swelling in her fingers that waxes and wanes due to arthritis.  No falls or syncopal episodes to report.  She has maintained a good appetite and is doing her best to stay well hydrated. Her weight is stable.  She walks regularly for exercise.   ECOG Performance Status: 1 - Symptomatic but completely ambulatory  Medications:  Allergies as of 04/16/2020      Reactions   Ativan [lorazepam] Other (See Comments)   Pt states she has taken before with no issue.   Pneumococcal Vaccines Other (See Comments)   Really high fever, and flu symptoms. MD instructed not to give   Cleocin [clindamycin Hcl] Other (See Comments)   "irritated my esophagus"   Morphine And Related Nausea And Vomiting   Other Nausea And Vomiting, Swelling   Shrimp if eat a lot   Buprenorphine Hcl Nausea And Vomiting   Pt does not know if she has ever had this medication      Medication List       Accurate as of April 16, 2020 12:36 PM. If you have any questions, ask your  nurse or doctor.        acetaminophen 500 MG tablet Commonly known as: TYLENOL Take 500 mg by mouth every 6 (six) hours as needed for mild pain or headache.   albuterol 108 (90 Base) MCG/ACT inhaler Commonly known as: VENTOLIN HFA Inhale 2 puffs into the lungs as needed for wheezing or shortness of breath.   ALPRAZolam 0.25 MG tablet Commonly known as: XANAX Take 0.25 mg by mouth 3 (three) times daily as needed for anxiety.   atorvastatin 10 MG tablet Commonly known as: LIPITOR Take 10 mg by mouth daily at 6 PM.   BIOTIN PO Take 2 capsules by mouth daily.   carvedilol 6.25 MG tablet Commonly known as: COREG Take 6.25 mg by mouth 2 (two) times daily with a meal.   Eliquis 5 MG Tabs tablet Generic drug: apixaban TAKE 1 TABLET BY MOUTH TWICE A DAY   flecainide 50 MG tablet Commonly known as: TAMBOCOR TAKE 1 TABLET BY MOUTH TWICE A DAY   fluticasone 50 MCG/ACT nasal spray Commonly known as: FLONASE PLACE 1 SPRAY INTO BOTH NOSTRILS 2 (TWO) TIMES DAILY.   glipiZIDE 10 MG 24 hr tablet Commonly known as: GLUCOTROL XL Take 10 mg by mouth daily.   Glucagon Emergency 1 MG Kit Inject 1 mg into the muscle as needed.   loratadine 10 MG tablet Commonly known as: CLARITIN Take 10 mg by mouth  daily as needed for allergies.   montelukast 10 MG tablet Commonly known as: SINGULAIR One a bedtime What changed:   how much to take  how to take this  when to take this  additional instructions   omeprazole 20 MG capsule Commonly known as: PRILOSEC Take 20 mg by mouth daily.   oxybutynin 10 MG 24 hr tablet Commonly known as: DITROPAN-XL Take 10 mg by mouth daily.   sertraline 50 MG tablet Commonly known as: ZOLOFT Take 50 mg by mouth daily.   vitamin C 1000 MG tablet Take 1,000 mg by mouth daily.   Vitamin D3 Adult Gummies 25 MCG (1000 UT) Chew Generic drug: Cholecalciferol Chew 2 each by mouth daily.   ZINC PO Take 1 tablet by mouth daily.        Allergies:  Allergies  Allergen Reactions  . Ativan [Lorazepam] Other (See Comments)    Pt states she has taken before with no issue.  . Pneumococcal Vaccines Other (See Comments)    Really high fever, and flu symptoms. MD instructed not to give  . Cleocin [Clindamycin Hcl] Other (See Comments)    "irritated my esophagus"  . Morphine And Related Nausea And Vomiting  . Other Nausea And Vomiting and Swelling    Shrimp if eat a lot  . Buprenorphine Hcl Nausea And Vomiting    Pt does not know if she has ever had this medication    Past Medical History, Surgical history, Social history, and Family History were reviewed and updated.  Review of Systems: All other 10 point review of systems is negative.   Physical Exam:  height is 4' 11"  (1.499 m) and weight is 156 lb 12.8 oz (71.1 kg). Her oral temperature is 98.6 F (37 C). Her blood pressure is 134/69 and her pulse is 85. Her respiration is 20 and oxygen saturation is 100%.   Wt Readings from Last 3 Encounters:  04/16/20 156 lb 12.8 oz (71.1 kg)  01/21/20 155 lb 1.9 oz (70.4 kg)  12/26/19 154 lb (69.9 kg)    Ocular: Sclerae unicteric, pupils equal, round and reactive to light Ear-nose-throat: Oropharynx clear, dentition fair Lymphatic: No cervical or supraclavicular adenopathy Lungs no rales or rhonchi, good excursion bilaterally Heart regular rate and rhythm, no murmur appreciated Abd soft, nontender, positive bowel sounds MSK no focal spinal tenderness, no joint edema Neuro: non-focal, well-oriented, appropriate affect Breasts: Deferred   Lab Results  Component Value Date   WBC 7.4 04/16/2020   HGB 10.3 (L) 04/16/2020   HCT 32.0 (L) 04/16/2020   MCV 97.0 04/16/2020   PLT 211 04/16/2020   Lab Results  Component Value Date   FERRITIN 233 01/21/2020   IRON 70 01/21/2020   TIBC 286 01/21/2020   UIBC 216 01/21/2020   IRONPCTSAT 24 01/21/2020   Lab Results  Component Value Date   RETICCTPCT 3.0 04/16/2020    RBC 3.30 (L) 04/16/2020   RBC 3.29 (L) 04/16/2020   No results found for: Nils Pyle [facility] Lab Results  Component Value Date   IGGSERUM 789 11/15/2016   IGA 150 11/15/2016   IGMSERUM 86 11/15/2016   No results found for: Ronnald Ramp, A1GS, A2GS, Tillman Sers, SPEI   Chemistry      Component Value Date/Time   NA 137 04/16/2020 1105   NA 140 05/01/2019 1536   NA 144 04/17/2017 1150   NA 141 04/18/2016 1256   K 4.1 04/16/2020 1105   K 4.6 04/17/2017 1150  K 4.4 04/18/2016 1256   CL 103 04/16/2020 1105   CL 109 (H) 04/17/2017 1150   CO2 23 04/16/2020 1105   CO2 23 04/17/2017 1150   CO2 18 (L) 04/18/2016 1256   BUN 30 (H) 04/16/2020 1105   BUN 38 (H) 05/01/2019 1536   BUN 28 (H) 04/17/2017 1150   BUN 27.3 (H) 04/18/2016 1256   CREATININE 1.33 (H) 04/16/2020 1105   CREATININE 1.3 (H) 04/17/2017 1150   CREATININE 1.4 (H) 04/18/2016 1256      Component Value Date/Time   CALCIUM 9.6 04/16/2020 1105   CALCIUM 9.6 04/17/2017 1150   CALCIUM 9.4 04/18/2016 1256   ALKPHOS 81 04/16/2020 1105   ALKPHOS 62 04/17/2017 1150   ALKPHOS 68 04/18/2016 1256   AST 27 04/16/2020 1105   AST 14 04/18/2016 1256   ALT 27 04/16/2020 1105   ALT 20 04/17/2017 1150   ALT 23 04/18/2016 1256   BILITOT 0.3 04/16/2020 1105   BILITOT <0.22 04/18/2016 1256       Impression and Plan: Ms. Aure is a very pleasant 64 yo caucasian female with history of stage I ductal carcinoma of the left breastdiagnosed in 2003 and so far there has been no evidence of recurrence. She also has history of multifactorial anemia.  She received Aranesp today for Hgb 10.3.  Iron studies are pending. We will replace if needed.  She goes this afternoon for annual mammogram.  Follow-up in 2 months.  She can contact our office with any questions or concerns.   Laverna Peace, NP 10/14/202112:36 PM

## 2020-04-16 NOTE — Patient Instructions (Signed)
Darbepoetin Alfa injection What is this medicine? DARBEPOETIN ALFA (dar be POE e tin AL fa) helps your body make more red blood cells. It is used to treat anemia caused by chronic kidney failure and chemotherapy. This medicine may be used for other purposes; ask your health care provider or pharmacist if you have questions. COMMON BRAND NAME(S): Aranesp What should I tell my health care provider before I take this medicine? They need to know if you have any of these conditions:  blood clotting disorders or history of blood clots  cancer patient not on chemotherapy  cystic fibrosis  heart disease, such as angina, heart failure, or a history of a heart attack  hemoglobin level of 12 g/dL or greater  high blood pressure  low levels of folate, iron, or vitamin B12  seizures  an unusual or allergic reaction to darbepoetin, erythropoietin, albumin, hamster proteins, latex, other medicines, foods, dyes, or preservatives  pregnant or trying to get pregnant  breast-feeding How should I use this medicine? This medicine is for injection into a vein or under the skin. It is usually given by a health care professional in a hospital or clinic setting. If you get this medicine at home, you will be taught how to prepare and give this medicine. Use exactly as directed. Take your medicine at regular intervals. Do not take your medicine more often than directed. It is important that you put your used needles and syringes in a special sharps container. Do not put them in a trash can. If you do not have a sharps container, call your pharmacist or healthcare provider to get one. A special MedGuide will be given to you by the pharmacist with each prescription and refill. Be sure to read this information carefully each time. Talk to your pediatrician regarding the use of this medicine in children. While this medicine may be used in children as young as 1 month of age for selected conditions, precautions do  apply. Overdosage: If you think you have taken too much of this medicine contact a poison control center or emergency room at once. NOTE: This medicine is only for you. Do not share this medicine with others. What if I miss a dose? If you miss a dose, take it as soon as you can. If it is almost time for your next dose, take only that dose. Do not take double or extra doses. What may interact with this medicine? Do not take this medicine with any of the following medications:  epoetin alfa This list may not describe all possible interactions. Give your health care provider a list of all the medicines, herbs, non-prescription drugs, or dietary supplements you use. Also tell them if you smoke, drink alcohol, or use illegal drugs. Some items may interact with your medicine. What should I watch for while using this medicine? Your condition will be monitored carefully while you are receiving this medicine. You may need blood work done while you are taking this medicine. This medicine may cause a decrease in vitamin B6. You should make sure that you get enough vitamin B6 while you are taking this medicine. Discuss the foods you eat and the vitamins you take with your health care professional. What side effects may I notice from receiving this medicine? Side effects that you should report to your doctor or health care professional as soon as possible:  allergic reactions like skin rash, itching or hives, swelling of the face, lips, or tongue  breathing problems  changes in   vision  chest pain  confusion, trouble speaking or understanding  feeling faint or lightheaded, falls  high blood pressure  muscle aches or pains  pain, swelling, warmth in the leg  rapid weight gain  severe headaches  sudden numbness or weakness of the face, arm or leg  trouble walking, dizziness, loss of balance or coordination  seizures (convulsions)  swelling of the ankles, feet, hands  unusually weak or  tired Side effects that usually do not require medical attention (report to your doctor or health care professional if they continue or are bothersome):  diarrhea  fever, chills (flu-like symptoms)  headaches  nausea, vomiting  redness, stinging, or swelling at site where injected This list may not describe all possible side effects. Call your doctor for medical advice about side effects. You may report side effects to FDA at 1-800-FDA-1088. Where should I keep my medicine? Keep out of the reach of children. Store in a refrigerator between 2 and 8 degrees C (36 and 46 degrees F). Do not freeze. Do not shake. Throw away any unused portion if using a single-dose vial. Throw away any unused medicine after the expiration date. NOTE: This sheet is a summary. It may not cover all possible information. If you have questions about this medicine, talk to your doctor, pharmacist, or health care provider.  2020 Elsevier/Gold Standard (2017-07-05 16:44:20)  

## 2020-04-16 NOTE — Telephone Encounter (Signed)
Appointments scheduled calendar printed & mailed per 10/14 los

## 2020-04-17 LAB — IRON AND TIBC
Iron: 52 ug/dL (ref 41–142)
Saturation Ratios: 20 % — ABNORMAL LOW (ref 21–57)
TIBC: 255 ug/dL (ref 236–444)
UIBC: 203 ug/dL (ref 120–384)

## 2020-04-17 LAB — FERRITIN: Ferritin: 329 ng/mL — ABNORMAL HIGH (ref 11–307)

## 2020-04-23 ENCOUNTER — Inpatient Hospital Stay: Payer: Medicare Other

## 2020-04-23 ENCOUNTER — Other Ambulatory Visit: Payer: Self-pay

## 2020-04-23 VITALS — BP 146/88 | HR 82 | Temp 98.7°F | Resp 17

## 2020-04-23 DIAGNOSIS — D508 Other iron deficiency anemias: Secondary | ICD-10-CM

## 2020-04-23 DIAGNOSIS — Z853 Personal history of malignant neoplasm of breast: Secondary | ICD-10-CM | POA: Diagnosis not present

## 2020-04-23 MED ORDER — SODIUM CHLORIDE 0.9 % IV SOLN
INTRAVENOUS | Status: DC
  Filled 2020-04-23: qty 250

## 2020-04-23 MED ORDER — SODIUM CHLORIDE 0.9 % IV SOLN
510.0000 mg | Freq: Once | INTRAVENOUS | Status: AC
  Administered 2020-04-23: 510 mg via INTRAVENOUS
  Filled 2020-04-23: qty 510

## 2020-04-23 NOTE — Patient Instructions (Signed)

## 2020-04-23 NOTE — Progress Notes (Signed)
Pt declined to stay for post infusion observation period. Pt stated she has tolerated medication multiple times prior without difficulty. Pt aware to call clinic with any questions or concerns. Pt verbalized understanding and had no further questions.  Pt discharged in stable condition.  

## 2020-05-27 ENCOUNTER — Other Ambulatory Visit: Payer: Self-pay

## 2020-05-27 DIAGNOSIS — I509 Heart failure, unspecified: Secondary | ICD-10-CM | POA: Insufficient documentation

## 2020-05-27 DIAGNOSIS — M1990 Unspecified osteoarthritis, unspecified site: Secondary | ICD-10-CM | POA: Insufficient documentation

## 2020-05-27 DIAGNOSIS — Z8489 Family history of other specified conditions: Secondary | ICD-10-CM | POA: Insufficient documentation

## 2020-05-27 DIAGNOSIS — J42 Unspecified chronic bronchitis: Secondary | ICD-10-CM | POA: Insufficient documentation

## 2020-05-27 DIAGNOSIS — E7800 Pure hypercholesterolemia, unspecified: Secondary | ICD-10-CM | POA: Insufficient documentation

## 2020-05-27 DIAGNOSIS — R011 Cardiac murmur, unspecified: Secondary | ICD-10-CM | POA: Insufficient documentation

## 2020-05-27 DIAGNOSIS — N390 Urinary tract infection, site not specified: Secondary | ICD-10-CM | POA: Insufficient documentation

## 2020-05-27 DIAGNOSIS — E119 Type 2 diabetes mellitus without complications: Secondary | ICD-10-CM | POA: Insufficient documentation

## 2020-05-27 DIAGNOSIS — R112 Nausea with vomiting, unspecified: Secondary | ICD-10-CM | POA: Insufficient documentation

## 2020-05-27 DIAGNOSIS — N200 Calculus of kidney: Secondary | ICD-10-CM | POA: Insufficient documentation

## 2020-05-27 DIAGNOSIS — G44209 Tension-type headache, unspecified, not intractable: Secondary | ICD-10-CM | POA: Insufficient documentation

## 2020-05-27 DIAGNOSIS — J189 Pneumonia, unspecified organism: Secondary | ICD-10-CM | POA: Insufficient documentation

## 2020-05-27 DIAGNOSIS — G43909 Migraine, unspecified, not intractable, without status migrainosus: Secondary | ICD-10-CM | POA: Insufficient documentation

## 2020-05-27 DIAGNOSIS — Z9221 Personal history of antineoplastic chemotherapy: Secondary | ICD-10-CM | POA: Insufficient documentation

## 2020-05-27 DIAGNOSIS — Z923 Personal history of irradiation: Secondary | ICD-10-CM | POA: Insufficient documentation

## 2020-05-27 DIAGNOSIS — Z9889 Other specified postprocedural states: Secondary | ICD-10-CM | POA: Insufficient documentation

## 2020-05-27 DIAGNOSIS — G8929 Other chronic pain: Secondary | ICD-10-CM | POA: Insufficient documentation

## 2020-05-27 DIAGNOSIS — I499 Cardiac arrhythmia, unspecified: Secondary | ICD-10-CM | POA: Insufficient documentation

## 2020-06-02 ENCOUNTER — Other Ambulatory Visit: Payer: Self-pay

## 2020-06-02 ENCOUNTER — Encounter: Payer: Self-pay | Admitting: Cardiology

## 2020-06-02 ENCOUNTER — Ambulatory Visit (INDEPENDENT_AMBULATORY_CARE_PROVIDER_SITE_OTHER): Payer: Medicare Other | Admitting: Cardiology

## 2020-06-02 VITALS — BP 134/72 | HR 84 | Ht 59.0 in | Wt 156.0 lb

## 2020-06-02 DIAGNOSIS — I10 Essential (primary) hypertension: Secondary | ICD-10-CM

## 2020-06-02 DIAGNOSIS — E1169 Type 2 diabetes mellitus with other specified complication: Secondary | ICD-10-CM

## 2020-06-02 DIAGNOSIS — I480 Paroxysmal atrial fibrillation: Secondary | ICD-10-CM

## 2020-06-02 DIAGNOSIS — I420 Dilated cardiomyopathy: Secondary | ICD-10-CM

## 2020-06-02 DIAGNOSIS — E669 Obesity, unspecified: Secondary | ICD-10-CM

## 2020-06-02 NOTE — Progress Notes (Signed)
Cardiology Office Note:    Date:  06/02/2020   ID:  LILIANNA CASE, DOB January 04, 1956, MRN [id number]  PCP:  Enid Skeens., MD  Cardiologist:  Jenne Campus, MD    Referring MD: Enid Skeens., MD   Chief Complaint  Patient presents with  . Follow-up  Cardiac wise and doing well  History of Present Illness:    [name] is a 64 y.o. female with past medical history significant for paroxysmal atrial fibrillation, she is anticoagulated with Eliquis, rhythm is controlled with flecainide, chronic renal failure, essential hypertension, diabetes, history of cardiomyopathy with normalization.  Comes today 2 months of follow-up overall she is doing well.  Denies have any chest pain tightness squeezing pressure burning chest.  No palpitations.  Past Medical History:  Diagnosis Date  . Acquired thrombophilia ([place]) 01/29/2020  . AKI (acute kidney injury) ([place]) 11/20/2019  . Allergy 09/08/2016  . AMS (altered mental status)   . 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 Anemia, normocytic normochromic 07/27/2018  . Ankle joint pain 04/09/2018  . Arthritis    "back, knees, arms, wrists" (05/15/2013)  . Asthma   . Asymmetrical left sensorineural hearing loss 07/31/2019  . Atrial fibrillation ([place]) 11/15/2016  . Breast cancer ([place])   . Calculus of kidney 08/12/2012  . CHF (congestive heart failure) ([place])    "mild" (05/15/2013)  . CHF (congestive heart failure) ([place])    "mild" (05/15/2013)  . Chronic bronchitis ([place])   . Chronic lower back pain   . Chronic non-seasonal allergic rhinitis 11/15/2016  . Chronic systolic congestive heart failure ([place])   . Chronic UTI   . Cough variant asthma vs uacs  07/24/2017   Onset in her 20s some better p rx with allergy shots in her 30s Singulair trial 11/15/16 >  Improved 01/18/17:  FEV1 1.29 L (61%)  Ratio 75  Still on ACEi as of 07/24/2017  Spirometry 06/28/18   FEV1 1.3 (61%)  Ratio 77 with min curvature   - Allergy profile 07/26/2018 >   Eos 1.2 /  IgE  420  RAST Pos mold only   . Diabetes mellitus type 2 in obese ([place])   . Diarrhea 11/15/2016  . Dilated cardiomyopathy ([place]) 07/16/2019  . DOE (dyspnea on exertion) 07/27/2018   Onset early 2019 assoc with uacs while on ACEi - 06/28/18    Walked RA x one lap =  approx 250 ft - stopped due to sob with sats still high 90's     . Dysphagia   . Dysrhythmia    atrial fib/dr [facility]  . Family history of anesthesia complication    "my mother also had PONV" (05/15/2013)  . Fever 10/29/2017  . Foot pain 04/09/2018  . GERD (gastroesophageal reflux disease)   . Heart murmur   . High cholesterol   . History of breast cancer in female 07/19/2013  . HTN (hypertension) 05/08/2013  . Hydronephrosis of right kidney   . Hyperlipidemia 11/15/2016  . Hyponatremia 10/29/2017  . IDA (iron deficiency anemia) 04/19/2017  . Infection   . Kidney stones   . Left breast mass 07/19/2013  . Low back pain 05/01/2018  . Migraine    "haven't had one in the early 2000's" (05/15/2013)  . Mixed incontinence 07/24/2018  . Nephrolithiasis 11/15/2016  . Other chronic cystitis 05/19/2011  . PAF (paroxysmal atrial fibrillation) ([place])   . Personal history of chemotherapy   . Personal history of radiation therapy   .  Pneumonia    "used to be chronic; last time I had it was 2013" (05/15/2013)  . PONV (postoperative nausea and vomiting)   . Recurrent sinusitis 09/08/2016  . Recurrent UTI 11/20/2014  . Recurrent UTI (urinary tract infection)    "from the continuous kidney stones; take Macrodantin qd" (05/15/2013)  . Respiratory failure requiring intubation ([place]) 05/08/2013  . Restrictive lung disease 01/18/2017  . Sacroiliac joint pain 05/16/2018  . Sepsis ([place]) 05   kidney stone infection  . Septic shock ([place]) 09/23/2018  . Simple chronic bronchitis ([place]) 11/15/2016  . Strep throat 10/29/2017  . Tachypnea   . Tension headache   . Type II diabetes mellitus ([place])   . Type II or unspecified type  diabetes mellitus without mention of complication, not stated as uncontrolled 05/11/2013  . Urinary tract stones 05/19/2011  . Urinary urgency 07/24/2018    Past Surgical History:  Procedure Laterality Date  . 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 BREAST BIOPSY Left 02/2001  . BREAST LUMPECTOMY Left 02/2001  . BREAST LUMPECTOMY Left 09/23/2013   Procedure: LEFT LUMPECTOMY WITH SPECIMEN MAMMOGRAM;  Surgeon: Adin Hector, MD;  Location: [place] [facility];  Service: General;  Laterality: Left;  . COLONOSCOPY    . DIAGNOSTIC LAPAROSCOPY     cyst-near ovary  . LITHOTRIPSY     "2-3 times prior to 2002" (05/15/2013)  . SPINAL FUSION N/A 05/08/2013   Procedure: T9-S1 INSTRUMENTED FUSION T12 -S1 DECOMPRESSION;  Surgeon: Melina Schools, MD;  Location: [facility];  Service: Orthopedics;  Laterality: N/A;  . TUBAL LIGATION Bilateral 1985    Current Medications: Current Meds  Medication Sig  . acetaminophen (TYLENOL) 500 MG tablet Take 500 mg by mouth every 6 (six) hours as needed for mild pain or headache.   . albuterol (VENTOLIN HFA) 108 (90 Base) MCG/ACT inhaler Inhale 2 puffs into the lungs as needed for wheezing or shortness of breath.  . ALPRAZolam (XANAX) 0.25 MG tablet Take 0.25 mg by mouth 3 (three) times daily as needed for anxiety.   . Ascorbic Acid (VITAMIN C) 1000 MG tablet Take 1,000 mg by mouth daily.  Marland Kitchen atorvastatin (LIPITOR) 10 MG tablet Take 10 mg by mouth daily.  . carvedilol (COREG) 6.25 MG tablet Take 6.25 mg by mouth 2 (two) times daily with a meal.   . Cholecalciferol (VITAMIN D3 ADULT GUMMIES) 25 MCG (1000 UT) CHEW Chew 2 each by mouth daily.  Marland Kitchen ELIQUIS 5 MG TABS tablet TAKE 1 TABLET BY MOUTH TWICE A DAY  . flecainide (TAMBOCOR) 50 MG tablet TAKE 1 TABLET BY MOUTH TWICE A DAY  . fluticasone (FLONASE) 50 MCG/ACT nasal spray PLACE 1 SPRAY INTO BOTH NOSTRILS 2 (TWO) TIMES DAILY.  Marland Kitchen  glipiZIDE (GLUCOTROL XL) 10 MG 24 hr tablet Take 10 mg by mouth in the morning and at bedtime.   Marland Kitchen glucagon (GLUCAGON EMERGENCY) 1 MG injection Inject 1 mg into the muscle as needed.   . loratadine (CLARITIN) 10 MG tablet Take 10 mg by mouth daily as needed for allergies.  . montelukast (SINGULAIR) 10 MG tablet One a bedtime (Patient taking differently: Take 10 mg by mouth at bedtime. )  . Multiple Vitamins-Minerals (ZINC PO) Take 1 tablet by mouth daily.  Marland Kitchen omeprazole (PRILOSEC) 20 MG capsule Take 20 mg by mouth daily.   Marland Kitchen oxybutynin (DITROPAN-XL) 10 MG 24 hr tablet Take 10 mg by mouth  daily.  . sertraline (ZOLOFT) 50 MG tablet Take 50 mg by mouth daily.     Allergies:   Ativan [lorazepam], Pneumococcal vaccines, Cleocin [clindamycin hcl], Morphine and related, Other, and Buprenorphine hcl   Social History   Socioeconomic History  . Marital status: Married    Spouse name: Not on file  . Number of children: Not on file  . Years of education: Not on file  . Highest education level: Not on file  Occupational History  . Not on file  Tobacco Use  . Smoking status: Passive Smoke Exposure - Never Smoker  . Smokeless tobacco: Never Used  . Tobacco comment: Mother & Father smoked  Vaping Use  . Vaping Use: Never used  Substance and Sexual Activity  . Alcohol use: No    Alcohol/week: 0.0 standard drinks  . Drug use: No  . Sexual activity: Yes  Other Topics Concern  . Not on file  Social History Narrative   [facility] (11/15/16):   Patient's originally from [place]. Has always lived in [place]. Currently has an outside dog. Remote exposure to [place] when her children were young. No known mold exposure. 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    Social Determinants of Health   Financial Resource Strain:   . Difficulty of Paying Living Expenses: Not on file  Food Insecurity:   . Worried About Charity fundraiser in the  Last Year: Not on file  . Ran Out of Food in the Last Year: Not on file  Transportation Needs:   . Lack of Transportation (Medical): Not on file  . Lack of Transportation (Non-Medical): Not on file  Physical Activity:   . Days of Exercise per Week: Not on file  . Minutes of Exercise per Session: Not on file  Stress:   . Feeling of Stress : Not on file  Social Connections:   . Frequency of Communication with Friends and Family: Not on file  . Frequency of Social Gatherings with Friends and Family: Not on file  . Attends Religious Services: Not on file  . Active Member of Clubs or Organizations: Not on file  . Attends Archivist Meetings: Not on file  . Marital Status: Not on file     Family History: The patient's family history includes Breast cancer in her mother; COPD in her mother; Environmental Allergies in her son and another family member; Heart disease in her mother; Kidney cancer in her father; Liver cancer in her brother; Lupus in an other family member. ROS:   Please see the history of present illness.    All 14 point review of systems negative except as described per history of present illness  EKGs/Labs/Other Studies Reviewed:      Recent Labs: 04/16/2020: ALT 27; BUN 30; Creatinine 1.33; Hemoglobin 10.3; Platelet Count 211; Potassium 4.1; Sodium 137  Recent Lipid Panel    Component Value Date/Time   TRIG 155 (H) 05/08/2013 2023    Physical Exam:    VS:  BP 134/72 (BP Location: Right Arm, Patient Position: Sitting)   Pulse 84   Ht 4\' 11"  (1.499 m)   Wt 156 lb (70.8 kg)   SpO2 95%   BMI 31.51 kg/m     Wt Readings from Last 3 Encounters:  06/02/20 156 lb (70.8 kg)  04/16/20 156 lb 12.8 oz (71.1 kg)  01/21/20 155 lb 1.9 oz (70.4 kg)     GEN:  Well nourished, well  developed in no acute distress HEENT: Normal NECK: No JVD; No carotid bruits LYMPHATICS: No lymphadenopathy CARDIAC: RRR, no murmurs, no rubs, no gallops RESPIRATORY:  Clear to  auscultation without rales, wheezing or rhonchi  ABDOMEN: Soft, non-tender, non-distended MUSCULOSKELETAL:  No edema; No deformity  SKIN: Warm and dry LOWER EXTREMITIES: no swelling NEUROLOGIC:  Alert and oriented x 3 PSYCHIATRIC:  Normal affect   ASSESSMENT:    1. Paroxysmal atrial fibrillation (HCC)   2. Primary hypertension   3. Dilated cardiomyopathy ([place])   4. Diabetes mellitus type 2 in obese [facility])    PLAN:    In order of problems listed above:  1. Paroxysmal atrial fibrillation EKG showed a sinus rhythm, she did not show have any palpitations.  We will continue anticoagulation will continue flecainide as well as beta-blockade. 2. Essential hypertension blood pressure well controlled continue present management. 3. History of dilated cardiomyopathy, echocardiogram reviewed done in summer showed normal left ventricle ejection fraction. 4. Chronic kidney failure.  She did have situationally her creatinine went very high so she is not on ACE inhibitor or ARB on Entresto, will continue monitoring her kidney function as well as ejection fraction likely both are stable. 5. Diabetes mellitus: Followed by internal medicine team. 6. Dyslipidemia, she is on Lipitor 10 which I will continue, I will call primary care physician to get her fasting lipid profile.   Medication Adjustments/Labs and Tests Ordered: Current medicines are reviewed at length with the patient today.  Concerns regarding medicines are outlined above.  No orders of the defined types were placed in this encounter.  Medication changes: No orders of the defined types were placed in this encounter.   Signed, Park Liter, MD, [facility] 06/02/2020 2:13 PM    [facility]

## 2020-06-02 NOTE — Patient Instructions (Signed)

## 2020-06-10 ENCOUNTER — Telehealth: Payer: Self-pay | Admitting: Nurse Practitioner

## 2020-06-10 ENCOUNTER — Encounter: Payer: Self-pay | Admitting: Nurse Practitioner

## 2020-06-10 ENCOUNTER — Other Ambulatory Visit: Payer: Self-pay | Admitting: Nurse Practitioner

## 2020-06-10 DIAGNOSIS — E119 Type 2 diabetes mellitus without complications: Secondary | ICD-10-CM

## 2020-06-10 DIAGNOSIS — E1169 Type 2 diabetes mellitus with other specified complication: Secondary | ICD-10-CM

## 2020-06-10 DIAGNOSIS — J42 Unspecified chronic bronchitis: Secondary | ICD-10-CM

## 2020-06-10 DIAGNOSIS — J984 Other disorders of lung: Secondary | ICD-10-CM

## 2020-06-10 DIAGNOSIS — J45909 Unspecified asthma, uncomplicated: Secondary | ICD-10-CM

## 2020-06-10 DIAGNOSIS — J9690 Respiratory failure, unspecified, unspecified whether with hypoxia or hypercapnia: Secondary | ICD-10-CM

## 2020-06-10 DIAGNOSIS — C50011 Malignant neoplasm of nipple and areola, right female breast: Secondary | ICD-10-CM

## 2020-06-10 DIAGNOSIS — D631 Anemia in chronic kidney disease: Secondary | ICD-10-CM

## 2020-06-10 DIAGNOSIS — I480 Paroxysmal atrial fibrillation: Secondary | ICD-10-CM

## 2020-06-10 DIAGNOSIS — I420 Dilated cardiomyopathy: Secondary | ICD-10-CM

## 2020-06-10 DIAGNOSIS — U071 COVID-19: Secondary | ICD-10-CM

## 2020-06-10 DIAGNOSIS — I5022 Chronic systolic (congestive) heart failure: Secondary | ICD-10-CM

## 2020-06-10 DIAGNOSIS — I10 Essential (primary) hypertension: Secondary | ICD-10-CM

## 2020-06-10 NOTE — Telephone Encounter (Signed)
I connected by phone with [name] on 06/10/2020 at 2:20 PM to discuss the potential use of a new treatment for mild to moderate COVID-19 viral infection in non-hospitalized patients.  This patient is a 64 y.o. female that meets the FDA criteria for Emergency Use Authorization of COVID monoclonal antibody casirivimab/imdevimab, bamlanivimab/eteseviamb, or sotrovimab.  Has a (+) direct SARS-CoV-2 viral test result  Has mild or moderate COVID-19   Is NOT hospitalized due to COVID-19  Is within 10 days of symptom onset  Has at least one of the high risk factor(s) for progression to severe COVID-19 and/or hospitalization as defined in EUA.  Specific high risk criteria : Older age (>/= 65 yo), BMI > 25, Chronic Kidney Disease (CKD), Diabetes, Cardiovascular disease or hypertension and Chronic Lung Disease   Sx onset: 12/2   I have spoken and communicated the following to the patient or parent/caregiver regarding COVID monoclonal antibody treatment:  1. FDA has authorized the emergency use for the treatment of mild to moderate COVID-19 in adults and pediatric patients with positive results of direct SARS-CoV-2 viral testing who are 14 years of age and older weighing at least 40 kg, and who are at high risk for progressing to severe COVID-19 and/or hospitalization.  2. The significant known and potential risks and benefits of COVID monoclonal antibody, and the extent to which such potential risks and benefits are unknown.  3. Information on available alternative treatments and the risks and benefits of those alternatives, including clinical trials.  4. Patients treated with COVID monoclonal antibody should continue to self-isolate and use infection control measures (e.g., wear mask, isolate, social distance, avoid sharing personal items, clean and disinfect "high touch" surfaces, and frequent handwashing) according to CDC guidelines.   5. The patient or parent/caregiver has the option to  accept or refuse COVID monoclonal antibody treatment.  After reviewing this information with the patient, the patient has agreed to receive one of the available covid 19 monoclonal antibodies and will be provided an appropriate fact sheet prior to infusion. Orma Render, NP 06/10/2020 2:20 PM

## 2020-06-11 ENCOUNTER — Other Ambulatory Visit (HOSPITAL_COMMUNITY): Payer: Self-pay

## 2020-06-11 ENCOUNTER — Ambulatory Visit (HOSPITAL_COMMUNITY)
Admission: RE | Admit: 2020-06-11 | Discharge: 2020-06-11 | Disposition: A | Discharge status: Home / Self Care | Payer: Medicare Other | Source: Ambulatory Visit | Attending: Pulmonary Disease | Admitting: Pulmonary Disease

## 2020-06-11 DIAGNOSIS — C50011 Malignant neoplasm of nipple and areola, right female breast: Secondary | ICD-10-CM | POA: Diagnosis present

## 2020-06-11 DIAGNOSIS — I420 Dilated cardiomyopathy: Secondary | ICD-10-CM | POA: Diagnosis present

## 2020-06-11 DIAGNOSIS — J984 Other disorders of lung: Secondary | ICD-10-CM | POA: Diagnosis present

## 2020-06-11 DIAGNOSIS — J9690 Respiratory failure, unspecified, unspecified whether with hypoxia or hypercapnia: Secondary | ICD-10-CM

## 2020-06-11 DIAGNOSIS — E1169 Type 2 diabetes mellitus with other specified complication: Secondary | ICD-10-CM | POA: Diagnosis present

## 2020-06-11 DIAGNOSIS — Z23 Encounter for immunization: Secondary | ICD-10-CM | POA: Insufficient documentation

## 2020-06-11 DIAGNOSIS — I480 Paroxysmal atrial fibrillation: Secondary | ICD-10-CM | POA: Diagnosis present

## 2020-06-11 DIAGNOSIS — I5022 Chronic systolic (congestive) heart failure: Secondary | ICD-10-CM

## 2020-06-11 DIAGNOSIS — I10 Essential (primary) hypertension: Secondary | ICD-10-CM

## 2020-06-11 DIAGNOSIS — N186 End stage renal disease: Secondary | ICD-10-CM | POA: Diagnosis present

## 2020-06-11 DIAGNOSIS — J45909 Unspecified asthma, uncomplicated: Secondary | ICD-10-CM

## 2020-06-11 DIAGNOSIS — U071 COVID-19: Secondary | ICD-10-CM

## 2020-06-11 DIAGNOSIS — J42 Unspecified chronic bronchitis: Secondary | ICD-10-CM

## 2020-06-11 DIAGNOSIS — D631 Anemia in chronic kidney disease: Secondary | ICD-10-CM

## 2020-06-11 DIAGNOSIS — E669 Obesity, unspecified: Secondary | ICD-10-CM

## 2020-06-11 MED ORDER — SODIUM CHLORIDE 0.9 % IV SOLN: INTRAVENOUS | Status: DC | PRN

## 2020-06-11 MED ORDER — DIPHENHYDRAMINE HCL 50 MG/ML IJ SOLN: 50.0000 mg | Freq: Once | INTRAMUSCULAR | Status: DC | PRN

## 2020-06-11 MED ORDER — SODIUM CHLORIDE 0.9 % IV SOLN
1200.0000 mg | Freq: Once | INTRAVENOUS | Status: AC
  Administered 2020-06-11: 1200 mg via INTRAVENOUS

## 2020-06-11 MED ORDER — METHYLPREDNISOLONE SODIUM SUCC 125 MG IJ SOLR: 125.0000 mg | Freq: Once | INTRAMUSCULAR | Status: DC | PRN

## 2020-06-11 MED ORDER — EPINEPHRINE 0.3 MG/0.3ML IJ SOAJ: 0.3000 mg | Freq: Once | INTRAMUSCULAR | Status: DC | PRN

## 2020-06-11 MED ORDER — ALBUTEROL SULFATE HFA 108 (90 BASE) MCG/ACT IN AERS: 2.0000 | INHALATION_SPRAY | Freq: Once | RESPIRATORY_TRACT | Status: DC | PRN

## 2020-06-11 MED ORDER — FAMOTIDINE IN NACL 20-0.9 MG/50ML-% IV SOLN: 20.0000 mg | Freq: Once | INTRAVENOUS | Status: DC | PRN

## 2020-06-11 NOTE — Progress Notes (Signed)
Patient reviewed Fact Sheet for Patients, Parents, and Caregivers for Emergency Use Authorization (EUA) of Regen-Covid for the Treatment of Coronavirus.  Patient also reviewed and is agreeable to the estimated cost of treatment.  Patient is agreeable to proceed.   

## 2020-06-11 NOTE — Progress Notes (Signed)
Diagnosis: COVID-19  Physician: Dr. Asencion Noble  Procedure: Medication fact sheet provided to patient; all questions answered.  Allergies reviewed with patient.  IV placed.  Regen-Cov administered via IV infusion.  Complications: No immediate complications noted.  Discharge: Discharged home   Monna Fam 06/11/2020

## 2020-06-11 NOTE — Discharge Instructions (Signed)
10 Things You Can Do to Manage Your COVID-19 Symptoms at Home If you have possible or confirmed COVID-19: 1. Stay home from work and school. And stay away from other public places. If you must go out, avoid using any kind of public transportation, ridesharing, or taxis. 2. Monitor your symptoms carefully. If your symptoms get worse, call your healthcare provider immediately. 3. Get rest and stay hydrated. 4. If you have a medical appointment, call the healthcare provider ahead of time and tell them that you have or may have COVID-19. 5. For medical emergencies, call 911 and notify the dispatch personnel that you have or may have COVID-19. 6. Cover your cough and sneezes with a tissue or use the inside of your elbow. 7. Wash your hands often with soap and water for at least 20 seconds or clean your hands with an alcohol-based hand sanitizer that contains at least 60% alcohol. 8. As much as possible, stay in a specific room and away from other people in your home. Also, you should use a separate bathroom, if available. If you need to be around other people in or outside of the home, wear a mask. 9. Avoid sharing personal items with other people in your household, like dishes, towels, and bedding. 10. Clean all surfaces that are touched often, like counters, tabletops, and doorknobs. Use household cleaning sprays or wipes according to the label instructions. cdc.gov/coronavirus 01/02/2019 This information is not intended to replace advice given to you by your health care provider. Make sure you discuss any questions you have with your health care provider. Document Revised: 06/06/2019 Document Reviewed: 06/06/2019 Elsevier Patient Education  2020 Elsevier Inc. What types of side effects do monoclonal antibody drugs cause?  Common side effects  In general, the more common side effects caused by monoclonal antibody drugs include: . Allergic reactions, such as hives or itching . Flu-like signs and  symptoms, including chills, fatigue, fever, and muscle aches and pains . Nausea, vomiting . Diarrhea . Skin rashes . Low blood pressure   The CDC is recommending patients who receive monoclonal antibody treatments wait at least 90 days before being vaccinated.  Currently, there are no data on the safety and efficacy of mRNA COVID-19 vaccines in persons who received monoclonal antibodies or convalescent plasma as part of COVID-19 treatment. Based on the estimated half-life of such therapies as well as evidence suggesting that reinfection is uncommon in the 90 days after initial infection, vaccination should be deferred for at least 90 days, as a precautionary measure until additional information becomes available, to avoid interference of the antibody treatment with vaccine-induced immune responses. If you have any questions or concerns after the infusion please call the Advanced Practice Provider on call at [phone]. This number is ONLY intended for your use regarding questions or concerns about the infusion post-treatment side-effects.  Please do not provide this number to others for use. For return to work notes please contact your primary care provider.   If someone you know is interested in receiving treatment please have them call the COVID hotline at [phone].   

## 2020-06-15 ENCOUNTER — Telehealth: Payer: Self-pay | Admitting: Family

## 2020-06-15 NOTE — Telephone Encounter (Signed)
I returned VM to patient she was requesting to move her appointments out until after Christmas due to having mild covid symptoms.  She had Covid infusion on 12/9. I did advise her to call back should she need to reschedule her next appt for 12/30.  She was in agreement with this information as well.

## 2020-06-16 ENCOUNTER — Inpatient Hospital Stay: Payer: Medicare Other

## 2020-06-16 ENCOUNTER — Inpatient Hospital Stay: Payer: Medicare Other | Admitting: Family

## 2020-07-02 ENCOUNTER — Inpatient Hospital Stay: Payer: Medicare Other | Attending: Hematology & Oncology

## 2020-07-02 ENCOUNTER — Encounter: Payer: Self-pay | Admitting: Family

## 2020-07-02 ENCOUNTER — Inpatient Hospital Stay: Payer: Medicare Other

## 2020-07-02 ENCOUNTER — Other Ambulatory Visit: Payer: Self-pay

## 2020-07-02 ENCOUNTER — Inpatient Hospital Stay (HOSPITAL_BASED_OUTPATIENT_CLINIC_OR_DEPARTMENT_OTHER): Payer: Medicare Other | Admitting: Family

## 2020-07-02 ENCOUNTER — Telehealth: Payer: Self-pay | Admitting: Family

## 2020-07-02 VITALS — BP 138/84 | HR 95 | Temp 99.7°F | Resp 18 | Ht 59.0 in | Wt 155.1 lb

## 2020-07-02 DIAGNOSIS — D508 Other iron deficiency anemias: Secondary | ICD-10-CM

## 2020-07-02 DIAGNOSIS — D631 Anemia in chronic kidney disease: Secondary | ICD-10-CM

## 2020-07-02 DIAGNOSIS — N183 Chronic kidney disease, stage 3 unspecified: Secondary | ICD-10-CM

## 2020-07-02 DIAGNOSIS — Z853 Personal history of malignant neoplasm of breast: Secondary | ICD-10-CM | POA: Diagnosis present

## 2020-07-02 DIAGNOSIS — N189 Chronic kidney disease, unspecified: Secondary | ICD-10-CM | POA: Diagnosis present

## 2020-07-02 LAB — CMP (CANCER CENTER ONLY)
ALT: 18 U/L (ref 0–44)
AST: 15 U/L (ref 15–41)
Albumin: 4.2 g/dL (ref 3.5–5.0)
Alkaline Phosphatase: 89 U/L (ref 38–126)
Anion gap: 10 (ref 5–15)
BUN: 20 mg/dL (ref 8–23)
CO2: 21 mmol/L — ABNORMAL LOW (ref 22–32)
Calcium: 9.3 mg/dL (ref 8.9–10.3)
Chloride: 108 mmol/L (ref 98–111)
Creatinine: 1.18 mg/dL — ABNORMAL HIGH (ref 0.44–1.00)
GFR, Estimated: 51 mL/min — ABNORMAL LOW (ref >60)
Glucose, Bld: 178 mg/dL — ABNORMAL HIGH (ref 70–99)
Potassium: 4.5 mmol/L (ref 3.5–5.1)
Sodium: 139 mmol/L (ref 135–145)
Total Bilirubin: 0.3 mg/dL (ref 0.3–1.2)
Total Protein: 7.3 g/dL (ref 6.5–8.1)

## 2020-07-02 LAB — CBC WITH DIFFERENTIAL (CANCER CENTER ONLY)
Abs Immature Granulocytes: 0.03 10*3/uL (ref 0.00–0.07)
Basophils Absolute: 0.1 10*3/uL (ref 0.0–0.1)
Basophils Relative: 1 %
Eosinophils Absolute: 0.2 10*3/uL (ref 0.0–0.5)
Eosinophils Relative: 2 %
HCT: 33 % — ABNORMAL LOW (ref 36.0–46.0)
Hemoglobin: 10.8 g/dL — ABNORMAL LOW (ref 12.0–15.0)
Immature Granulocytes: 0 %
Lymphocytes Relative: 18 %
Lymphs Abs: 1.8 10*3/uL (ref 0.7–4.0)
MCH: 31.5 pg (ref 26.0–34.0)
MCHC: 32.7 g/dL (ref 30.0–36.0)
MCV: 96.2 fL (ref 80.0–100.0)
Monocytes Absolute: 0.7 10*3/uL (ref 0.1–1.0)
Monocytes Relative: 8 %
Neutro Abs: 7.1 10*3/uL (ref 1.7–7.7)
Neutrophils Relative %: 71 %
Platelet Count: 194 10*3/uL (ref 150–400)
RBC: 3.43 MIL/uL — ABNORMAL LOW (ref 3.87–5.11)
RDW: 14.7 % (ref 11.5–15.5)
WBC Count: 9.9 10*3/uL (ref 4.0–10.5)
nRBC: 0 % (ref 0.0–0.2)

## 2020-07-02 LAB — RETICULOCYTES
Immature Retic Fract: 21.6 % — ABNORMAL HIGH (ref 2.3–15.9)
RBC.: 3.46 MIL/uL — ABNORMAL LOW (ref 3.87–5.11)
Retic Count, Absolute: 94.8 10*3/uL (ref 19.0–186.0)
Retic Ct Pct: 2.7 % (ref 0.4–3.1)

## 2020-07-02 LAB — IRON AND TIBC
Iron: 60 ug/dL (ref 41–142)
Saturation Ratios: 25 % (ref 21–57)
TIBC: 244 ug/dL (ref 236–444)
UIBC: 184 ug/dL (ref 120–384)

## 2020-07-02 LAB — FERRITIN: Ferritin: 657 ng/mL — ABNORMAL HIGH (ref 11–307)

## 2020-07-02 MED ORDER — DARBEPOETIN ALFA 300 MCG/0.6ML IJ SOSY
300.0000 ug | PREFILLED_SYRINGE | Freq: Once | INTRAMUSCULAR | Status: AC
  Administered 2020-07-02: 11:00:00 300 ug via SUBCUTANEOUS

## 2020-07-02 MED ORDER — DARBEPOETIN ALFA 300 MCG/0.6ML IJ SOSY
PREFILLED_SYRINGE | INTRAMUSCULAR | Status: AC
  Filled 2020-07-02: qty 0.6

## 2020-07-02 NOTE — Progress Notes (Signed)
Hematology and Oncology Follow Up Visit  [name] [id number] 26-Aug-1955 64 y.o. 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 Both she and her husband had covid earlier this month. They were able to get the antibody infusions but unfortunately he is back in the hospital with pneumonia right now.  She is feeling fatigued. She has a dry cough with sinus congestion and SOB with over exertion. Her temp is 99.7. She is currently on Amoxacillin (started yesterday) and resst when she can. Lung sounds are clear throughout on exam.  No chills, n/v, rash, dizziness, chest pain, palpitations, abdominal pain or changes in bowel or bladder habits.  No swelling, tenderness, numbness or tingling in her extremities at this time. She will occasionally have positional numbness and tingling in her hands due to chronic neck and shoulder issues.   No falls or syncope.  She has maintained a good appetite and is doing her best to stay well hydrated. Her weight is stable at 155 lbs.   ECOG Performance Status: 1 - Symptomatic but completely ambulatory  Medications:  Allergies as of 07/02/2020      Reactions   Ativan [lorazepam] Other (See Comments)   Pt states she has taken before with no issue.   Pneumococcal Vaccines Other (See Comments)   Really high fever, and flu symptoms. MD instructed not to give   Cleocin [clindamycin Hcl] Other (See Comments)   "irritated my esophagus"   Morphine And Related Nausea And Vomiting   Other Nausea And Vomiting, Swelling   Shrimp if eat a lot   Buprenorphine Hcl Nausea And Vomiting   Pt does not know if she has ever had this medication      Medication List        Accurate as of July 02, 2020 10:27 AM. If you have any questions, ask your nurse or doctor.        acetaminophen 500 MG tablet Commonly known as: TYLENOL Take 500 mg by mouth every 6 (six) hours as needed for mild pain or headache.   albuterol 108 (90 Base) MCG/ACT inhaler Commonly known as: VENTOLIN HFA Inhale 2 puffs into the lungs as needed for wheezing or shortness of breath.   ALPRAZolam 0.25 MG tablet Commonly known as: XANAX Take 0.25 mg by mouth 3 (three) times daily as needed for anxiety.   atorvastatin 10 MG tablet Commonly known as: LIPITOR Take 10 mg by mouth daily.   carvedilol 6.25 MG tablet Commonly known as: COREG Take 6.25 mg by mouth 2 (two) times daily with a meal.   Eliquis 5 MG Tabs tablet Generic drug: apixaban TAKE 1 TABLET BY MOUTH TWICE A DAY   flecainide 50 MG tablet Commonly known as: TAMBOCOR TAKE 1 TABLET BY MOUTH TWICE A DAY   fluticasone 50 MCG/ACT nasal spray Commonly known as: FLONASE PLACE 1 SPRAY INTO BOTH NOSTRILS 2 (TWO) TIMES DAILY.   glipiZIDE 10 MG 24 hr tablet Commonly known as: GLUCOTROL XL Take 10 mg by mouth in the morning and at bedtime.   Glucagon Emergency 1 MG Kit Inject 1 mg into the muscle as needed.   loratadine 10 MG tablet Commonly known as: CLARITIN Take 10 mg by mouth daily as  needed for allergies.   montelukast 10 MG tablet Commonly known as: SINGULAIR One a bedtime What changed:   how much to take  how to take this  when to take this  additional instructions   omeprazole 20 MG capsule Commonly known as: PRILOSEC Take 20 mg by mouth daily.   oxybutynin 10 MG 24 hr tablet Commonly known as: DITROPAN-XL Take 10 mg by mouth daily.   sertraline 50 MG tablet Commonly known as: ZOLOFT Take 50 mg by mouth daily.   vitamin C 1000 MG tablet Take 1,000 mg by mouth daily.   Vitamin D3 Adult Gummies 25 MCG (1000 UT) Chew Generic drug: Cholecalciferol Chew 2 each by mouth daily.   ZINC  PO Take 1 tablet by mouth daily.       Allergies:  Allergies  Allergen Reactions  . Ativan [Lorazepam] Other (See Comments)    Pt states she has taken before with no issue.  . Pneumococcal Vaccines Other (See Comments)    Really high fever, and flu symptoms. MD instructed not to give  . Cleocin [Clindamycin Hcl] Other (See Comments)    "irritated my esophagus"  . Morphine And Related Nausea And Vomiting  . Other Nausea And Vomiting and Swelling    Shrimp if eat a lot  . Buprenorphine Hcl Nausea And Vomiting    Pt does not know if she has ever had this medication    Past Medical History, Surgical history, Social history, and Family History were reviewed and updated.  Review of Systems: All other 10 point review of systems is negative.   Physical Exam:  height is _0  (1.499 m) and weight is 155 lb 1.9 oz (70.4 kg). Her oral temperature is 99.7 F (37.6 C). Her blood pressure is 138/84 and her pulse is 95. Her respiration is 18 and oxygen saturation is 99%.   Wt Readings from Last 3 Encounters:  07/02/20 155 lb 1.9 oz (70.4 kg)  06/02/20 156 lb (70.8 kg)  04/16/20 156 lb 12.8 oz (71.1 kg)    Ocular: Sclerae unicteric, pupils equal, round and reactive to light Ear-nose-throat: Oropharynx clear, dentition fair Lymphatic: No cervical or supraclavicular adenopathy Lungs no rales or rhonchi, good excursion bilaterally Heart regular rate and rhythm, no murmur appreciated Abd soft, nontender, positive bowel sounds MSK no focal spinal tenderness, no joint edema Neuro: non-focal, well-oriented, appropriate affect Breasts: Deferred   Lab Results  Component Value Date   WBC 9.9 07/02/2020   HGB 10.8 (L) 07/02/2020   HCT 33.0 (L) 07/02/2020   MCV 96.2 07/02/2020   PLT 194 07/02/2020   Lab Results  Component Value Date   FERRITIN 329 (H) 04/16/2020   IRON 52 04/16/2020   TIBC 255 04/16/2020   UIBC 203 04/16/2020   IRONPCTSAT 20 (L) 04/16/2020   Lab Results   Component Value Date   RETICCTPCT 2.7 07/02/2020   RBC 3.46 (L) 07/02/2020   No results found for: Nils Pyle [facility] Lab Results  Component Value Date   IGGSERUM 789 11/15/2016   IGA 150 11/15/2016   IGMSERUM 86 11/15/2016   No results found for: Odetta Pink, SPEI   Chemistry      Component Value Date/Time   NA 139 07/02/2020 0943   NA 140 05/01/2019 1536   NA 144 04/17/2017 1150   NA 141 04/18/2016 1256   K 4.5 07/02/2020 0943   K 4.6 04/17/2017 1150   K 4.4 04/18/2016 1256  CL 108 07/02/2020 0943   CL 109 (H) 04/17/2017 1150   CO2 21 (L) 07/02/2020 0943   CO2 23 04/17/2017 1150   CO2 18 (L) 04/18/2016 1256   BUN 20 07/02/2020 0943   BUN 38 (H) 05/01/2019 1536   BUN 28 (H) 04/17/2017 1150   BUN 27.3 (H) 04/18/2016 1256   CREATININE 1.18 (H) 07/02/2020 0943   CREATININE 1.3 (H) 04/17/2017 1150   CREATININE 1.4 (H) 04/18/2016 1256      Component Value Date/Time   CALCIUM 9.3 07/02/2020 0943   CALCIUM 9.6 04/17/2017 1150   CALCIUM 9.4 04/18/2016 1256   ALKPHOS 89 07/02/2020 0943   ALKPHOS 62 04/17/2017 1150   ALKPHOS 68 04/18/2016 1256   AST 15 07/02/2020 0943   AST 14 04/18/2016 1256   ALT 18 07/02/2020 0943   ALT 20 04/17/2017 1150   ALT 23 04/18/2016 1256   BILITOT 0.3 07/02/2020 0943   BILITOT <0.22 04/18/2016 1256       Impression and Plan: Ms. Lemere is a very pleasant 64 yo caucasian female with history of stage I ductal carcinoma of the left breastdiagnosed in 2003and so far there has been no evidence of recurrence. She also has history of multifactorial anemia. She received Aranesp today for Hgb 10.8.  Iron studies are pending. We will replace if needed.  Follow-up in 1 month.  She can contact our office with any questions or concerns.   Laverna Peace, NP 12/30/202110:27 AM

## 2020-07-02 NOTE — Patient Instructions (Signed)
Darbepoetin Alfa injection What is this medicine? DARBEPOETIN ALFA (dar be POE e tin AL fa) helps your body make more red blood cells. It is used to treat anemia caused by chronic kidney failure and chemotherapy. This medicine may be used for other purposes; ask your health care provider or pharmacist if you have questions. COMMON BRAND NAME(S): Aranesp What should I tell my health care provider before I take this medicine? They need to know if you have any of these conditions:  blood clotting disorders or history of blood clots  cancer patient not on chemotherapy  cystic fibrosis  heart disease, such as angina, heart failure, or a history of a heart attack  hemoglobin level of 12 g/dL or greater  high blood pressure  low levels of folate, iron, or vitamin B12  seizures  an unusual or allergic reaction to darbepoetin, erythropoietin, albumin, hamster proteins, latex, other medicines, foods, dyes, or preservatives  pregnant or trying to get pregnant  breast-feeding How should I use this medicine? This medicine is for injection into a vein or under the skin. It is usually given by a health care professional in a hospital or clinic setting. If you get this medicine at home, you will be taught how to prepare and give this medicine. Use exactly as directed. Take your medicine at regular intervals. Do not take your medicine more often than directed. It is important that you put your used needles and syringes in a special sharps container. Do not put them in a trash can. If you do not have a sharps container, call your pharmacist or healthcare provider to get one. A special MedGuide will be given to you by the pharmacist with each prescription and refill. Be sure to read this information carefully each time. Talk to your pediatrician regarding the use of this medicine in children. While this medicine may be used in children as young as 1 month of age for selected conditions, precautions do  apply. Overdosage: If you think you have taken too much of this medicine contact a poison control center or emergency room at once. NOTE: This medicine is only for you. Do not share this medicine with others. What if I miss a dose? If you miss a dose, take it as soon as you can. If it is almost time for your next dose, take only that dose. Do not take double or extra doses. What may interact with this medicine? Do not take this medicine with any of the following medications:  epoetin alfa This list may not describe all possible interactions. Give your health care provider a list of all the medicines, herbs, non-prescription drugs, or dietary supplements you use. Also tell them if you smoke, drink alcohol, or use illegal drugs. Some items may interact with your medicine. What should I watch for while using this medicine? Your condition will be monitored carefully while you are receiving this medicine. You may need blood work done while you are taking this medicine. This medicine may cause a decrease in vitamin B6. You should make sure that you get enough vitamin B6 while you are taking this medicine. Discuss the foods you eat and the vitamins you take with your health care professional. What side effects may I notice from receiving this medicine? Side effects that you should report to your doctor or health care professional as soon as possible:  allergic reactions like skin rash, itching or hives, swelling of the face, lips, or tongue  breathing problems  changes in   vision  chest pain  confusion, trouble speaking or understanding  feeling faint or lightheaded, falls  high blood pressure  muscle aches or pains  pain, swelling, warmth in the leg  rapid weight gain  severe headaches  sudden numbness or weakness of the face, arm or leg  trouble walking, dizziness, loss of balance or coordination  seizures (convulsions)  swelling of the ankles, feet, hands  unusually weak or  tired Side effects that usually do not require medical attention (report to your doctor or health care professional if they continue or are bothersome):  diarrhea  fever, chills (flu-like symptoms)  headaches  nausea, vomiting  redness, stinging, or swelling at site where injected This list may not describe all possible side effects. Call your doctor for medical advice about side effects. You may report side effects to FDA at 1-800-FDA-1088. Where should I keep my medicine? Keep out of the reach of children. Store in a refrigerator between 2 and 8 degrees C (36 and 46 degrees F). Do not freeze. Do not shake. Throw away any unused portion if using a single-dose vial. Throw away any unused medicine after the expiration date. NOTE: This sheet is a summary. It may not cover all possible information. If you have questions about this medicine, talk to your doctor, pharmacist, or health care provider.  2020 Elsevier/Gold Standard (2017-07-05 16:44:20)  

## 2020-07-02 NOTE — Telephone Encounter (Signed)
Appointments scheduled patient stated she would get updates from My Chart 12/30 los

## 2020-07-08 ENCOUNTER — Ambulatory Visit: Payer: Federal, State, Local not specified - PPO | Admitting: Cardiology

## 2020-07-08 ENCOUNTER — Telehealth: Payer: Self-pay

## 2020-07-08 ENCOUNTER — Encounter: Payer: Self-pay | Admitting: Cardiology

## 2020-07-08 ENCOUNTER — Ambulatory Visit (INDEPENDENT_AMBULATORY_CARE_PROVIDER_SITE_OTHER): Payer: Medicare Other | Admitting: Cardiology

## 2020-07-08 ENCOUNTER — Telehealth: Payer: Self-pay | Admitting: Cardiology

## 2020-07-08 ENCOUNTER — Other Ambulatory Visit: Payer: Self-pay

## 2020-07-08 VITALS — BP 146/82 | HR 46 | Ht 60.0 in | Wt 155.0 lb

## 2020-07-08 DIAGNOSIS — I10 Essential (primary) hypertension: Secondary | ICD-10-CM | POA: Diagnosis not present

## 2020-07-08 DIAGNOSIS — I480 Paroxysmal atrial fibrillation: Secondary | ICD-10-CM

## 2020-07-08 DIAGNOSIS — I420 Dilated cardiomyopathy: Secondary | ICD-10-CM | POA: Diagnosis not present

## 2020-07-08 MED ORDER — CARVEDILOL 6.25 MG PO TABS: 6.2500 mg | ORAL_TABLET | Freq: Three times a day (TID) | ORAL | 1 refills | Status: DC

## 2020-07-08 NOTE — Addendum Note (Signed)
Addended by: Darrel Reach on: 07/08/2020 04:49 PM   Modules accepted: Orders

## 2020-07-08 NOTE — Progress Notes (Signed)
Cardiology Office Note:    Date:  07/08/2020   ID:  [name], DOB 1956-01-05, MRN [id number]  PCP:  Enid Skeens., MD  Cardiologist:  Jenne Campus, MD    Referring MD: Enid Skeens., MD   Chief Complaint  Patient presents with  . Atrial Fibrillation    History of Present Illness:    [name] is a 65 y.o. female with past medical history significant for paroxysmal atrial fibrillation, successfully suppressed with flecainide 50 twice daily as well as anticoagulated Eliquis.  She had no cardioversion in the past.  She always converted spontaneously with medications.  Also history of chronic renal failure, essential hypertension, diabetes history of cardiomyopathy however normalization.  She is requested to be seen because since this morning she experienced palpitations.  When she came to our office we did EKG we find her to be in atrial fibrillation.  Her heart rate is slightly excessive however overall she seems to be doing fine otherwise.  She does have some more shortness of breath with exertion.  But no chest pain tightness squeezing pressure burning chest no dizziness no passing out.  Past Medical History:  Diagnosis Date  . Acquired thrombophilia ([place]) 01/29/2020  . AKI (acute kidney injury) ([place]) 11/20/2019  . Allergy 09/08/2016  . AMS (altered mental status)   . 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) 07/16/2019  . Anemia, normocytic normochromic 07/27/2018  . Ankle joint pain 04/09/2018  . Arthritis    "back, knees, arms, wrists" (05/15/2013)  . Asthma   . Asymmetrical left sensorineural hearing loss 07/31/2019  . Atrial fibrillation ([place]) 11/15/2016  . Breast cancer ([place])   . Calculus of kidney 08/12/2012  . CHF (congestive heart failure) ([place])    "mild" (05/15/2013)  . CHF (congestive heart failure) ([place])    "mild" (05/15/2013)  . Chronic bronchitis ([place])   . Chronic lower back pain   . Chronic non-seasonal allergic rhinitis 11/15/2016  . Chronic  systolic congestive heart failure ([place])   . Chronic UTI   . Cough variant asthma vs uacs  07/24/2017   Onset in her 20s some better p rx with allergy shots in her 30s Singulair trial 11/15/16 >  Improved 01/18/17:  FEV1 1.29 L (61%)  Ratio 75  Still on ACEi as of 07/24/2017  Spirometry 06/28/18   FEV1 1.3 (61%)  Ratio 77 with min curvature   - Allergy profile 07/26/2018 >  Eos 1.2 /  IgE  420  RAST Pos mold only   . Diabetes mellitus type 2 in obese ([place])   . Diarrhea 11/15/2016  . Dilated cardiomyopathy ([place]) 07/16/2019  . DOE (dyspnea on exertion) 07/27/2018   Onset early 2019 assoc with uacs while on ACEi - 06/28/18    Walked RA x one lap =  approx 250 ft - stopped due to sob with sats still high 90's     . Dysphagia   . Dysrhythmia    atrial fib/dr [place] [facility]  . Family history of anesthesia complication    "my mother also had PONV" (05/15/2013)  . Fever 10/29/2017  . Foot pain 04/09/2018  . GERD (gastroesophageal reflux disease)   . Heart murmur   . High cholesterol   . History of breast cancer in female 07/19/2013  . HTN (hypertension) 05/08/2013  . Hydronephrosis of right kidney   . Hyperlipidemia 11/15/2016  . Hyponatremia 10/29/2017  . IDA (iron deficiency anemia) 04/19/2017  . Infection   . Kidney stones   .  Left breast mass 07/19/2013  . Low back pain 05/01/2018  . Migraine    "haven't had one in the early 2000's" (05/15/2013)  . Mixed incontinence 07/24/2018  . Nephrolithiasis 11/15/2016  . Other chronic cystitis 05/19/2011  . PAF (paroxysmal atrial fibrillation) ([place])   . Personal history of chemotherapy   . Personal history of radiation therapy   . Pneumonia    "used to be chronic; last time I had it was 2013" (05/15/2013)  . PONV (postoperative nausea and vomiting)   . Recurrent sinusitis 09/08/2016  . Recurrent UTI 11/20/2014  . Recurrent UTI (urinary tract infection)    "from the continuous kidney stones; take Macrodantin qd" (05/15/2013)  . Respiratory  failure requiring intubation ([place]) 05/08/2013  . Restrictive lung disease 01/18/2017  . Sacroiliac joint pain 05/16/2018  . Sepsis ([place]) 05   kidney stone infection  . Septic shock ([place]) 09/23/2018  . Simple chronic bronchitis ([place]) 11/15/2016  . Strep throat 10/29/2017  . Tachypnea   . Tension headache   . Type II diabetes mellitus ([place])   . Type II or unspecified type diabetes mellitus without mention of complication, not stated as uncontrolled 05/11/2013  . Urinary tract stones 05/19/2011  . Urinary urgency 07/24/2018    Past Surgical History:  Procedure Laterality Date  . 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 BREAST BIOPSY Left 02/2001  . BREAST LUMPECTOMY Left 02/2001  . BREAST LUMPECTOMY Left 09/23/2013   Procedure: LEFT LUMPECTOMY WITH SPECIMEN MAMMOGRAM;  Surgeon: Adin Hector, MD;  Location: [facility];  Service: General;  Laterality: Left;  . COLONOSCOPY    . DIAGNOSTIC LAPAROSCOPY     cyst-near ovary  . LITHOTRIPSY     "2-3 times prior to 2002" (05/15/2013)  . SPINAL FUSION N/A 05/08/2013   Procedure: T9-S1 INSTRUMENTED FUSION T12 -S1 DECOMPRESSION;  Surgeon: Melina Schools, MD;  Location: [facility];  Service: Orthopedics;  Laterality: N/A;  . TUBAL LIGATION Bilateral 1985    Current Medications: Current Meds  Medication Sig  . acetaminophen (TYLENOL) 500 MG tablet Take 500 mg by mouth every 6 (six) hours as needed for mild pain or headache.   . albuterol (VENTOLIN HFA) 108 (90 Base) MCG/ACT inhaler Inhale 2 puffs into the lungs as needed for wheezing or shortness of breath.  . ALPRAZolam (XANAX) 0.25 MG tablet Take 0.25 mg by mouth 3 (three) times daily as needed for anxiety.   Marland Kitchen amoxicillin-clavulanate (AUGMENTIN) 875-125 MG tablet Take 1 tablet by mouth 2 (two) times daily.  . Ascorbic Acid (VITAMIN C) 1000 MG tablet Take 1,000 mg by mouth daily.  Marland Kitchen atorvastatin  (LIPITOR) 10 MG tablet Take 10 mg by mouth daily.  . Cholecalciferol (VITAMIN D3 ADULT GUMMIES) 25 MCG (1000 UT) CHEW Chew 2 each by mouth daily.  Marland Kitchen ELIQUIS 5 MG TABS tablet TAKE 1 TABLET BY MOUTH TWICE A DAY  . flecainide (TAMBOCOR) 50 MG tablet TAKE 1 TABLET BY MOUTH TWICE A DAY  . fluticasone (FLONASE) 50 MCG/ACT nasal spray PLACE 1 SPRAY INTO BOTH NOSTRILS 2 (TWO) TIMES DAILY.  Marland Kitchen glipiZIDE (GLUCOTROL XL) 10 MG 24 hr tablet Take 10 mg by mouth in the morning and at bedtime.   Marland Kitchen glucagon (GLUCAGON EMERGENCY) 1 MG injection Inject 1 mg into the muscle as needed.   . loratadine (CLARITIN) 10 MG tablet Take 10 mg by mouth daily as needed for allergies.  Marland Kitchen  montelukast (SINGULAIR) 10 MG tablet One a bedtime (Patient taking differently: Take 10 mg by mouth at bedtime.)  . Multiple Vitamins-Minerals (ZINC PO) Take 1 tablet by mouth daily.  Marland Kitchen omeprazole (PRILOSEC) 20 MG capsule Take 20 mg by mouth daily.   . sertraline (ZOLOFT) 50 MG tablet Take 50 mg by mouth daily.  . [DISCONTINUED] carvedilol (COREG) 6.25 MG tablet Take 6.25 mg by mouth 2 (two) times daily with a meal.      Allergies:   Ativan [lorazepam], Pneumococcal vaccines, Cleocin [clindamycin hcl], Morphine and related, Other, and Buprenorphine hcl   Social History   Socioeconomic History  . Marital status: Married    Spouse name: Not on file  . Number of children: Not on file  . Years of education: Not on file  . Highest education level: Not on file  Occupational History  . Not on file  Tobacco Use  . Smoking status: Passive Smoke Exposure - Never Smoker  . Smokeless tobacco: Never Used  . Tobacco comment: Mother & Father smoked  Vaping Use  . Vaping Use: Never used  Substance and Sexual Activity  . Alcohol use: No    Alcohol/week: 0.0 standard drinks  . Drug use: No  . Sexual activity: Yes  Other Topics Concern  . Not on file  Social History Narrative   [facility] (11/15/16):   Patient's originally from [place]. Has always lived in [place]. Currently has an outside dog. Remote exposure to [place] when her children were young. No known mold exposure. 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    Social Determinants of Health   Financial Resource Strain: Not on file  Food Insecurity: Not on file  Transportation Needs: Not on file  Physical Activity: Not on file  Stress: Not on file  Social Connections: Not on file     Family History: The patient's family history includes Breast cancer in her mother; COPD in her mother; Environmental Allergies in her son and another family member; Heart disease in her mother; Kidney cancer in her father; Liver cancer in her brother; Lupus in an other family member. ROS:   Please see the history of present illness.    All 14 point review of systems negative except as described per history of present illness  EKGs/Labs/Other Studies Reviewed:      Recent Labs: 07/02/2020: ALT 18; BUN 20; Creatinine 1.18; Hemoglobin 10.8; Platelet Count 194; Potassium 4.5; Sodium 139  Recent Lipid Panel    Component Value Date/Time   TRIG 155 (H) 05/08/2013 2023    Physical Exam:    VS:  BP (!) 146/82 (BP Location: Right Arm, Patient Position: Sitting)   Pulse (!) 46   Ht 5' (1.524 m)   Wt 155 lb (70.3 kg)   SpO2 96%   BMI 30.27 kg/m     Wt Readings from Last 3 Encounters:  07/08/20 155 lb (70.3 kg)  07/02/20 155 lb 1.9 oz (70.4 kg)  06/02/20 156 lb (70.8 kg)     GEN:  Well nourished, well developed in no acute distress HEENT: Normal NECK: No JVD; No carotid bruits LYMPHATICS: No lymphadenopathy CARDIAC: Irregularly irregular, no murmurs, no rubs, no gallops RESPIRATORY:  Clear to auscultation without rales, wheezing or rhonchi  ABDOMEN: Soft, non-tender, non-distended MUSCULOSKELETAL:  No edema; No deformity  SKIN: Warm and dry LOWER EXTREMITIES: no swelling NEUROLOGIC:  Alert and oriented x  3 PSYCHIATRIC:  Normal affect   ASSESSMENT:  1. PAF (paroxysmal atrial fibrillation) ([place])   2. Primary hypertension   3. Dilated cardiomyopathy ([place])    PLAN:    In order of problems listed above:  1. Paroxysmal atrial fibrillation she does have episodes right now and we discussed options in this situation offer her possibility of hospitalization with cardioversion however she preferred more conservative approach.  Therefore, I will increase slightly the dose of her beta-blocker I will simply increase carvedilol to 3 times daily.  We will call her in 2 days and see if she converted to sinus rhythm if not we will increase the dose of flecainide and try to convince her to be cardioverted may be at the beginning of next week.  Of course we will continue anticoagulation.  I told her clearly if she still having some shortness of breath chest pain dizziness passing out she need to call 911 go to the emergency room. 2. Essential hypertension: Appears to be well controlled continue present management. 3. History of dilated cardiomyopathy with normalization.  I did review her last echocardiogram showed preserved left ventricle ejection fraction.   Medication Adjustments/Labs and Tests Ordered: Current medicines are reviewed at length with the patient today.  Concerns regarding medicines are outlined above.  No orders of the defined types were placed in this encounter.  Medication changes:  Meds ordered this encounter  Medications  . carvedilol (COREG) 6.25 MG tablet    Sig: Take 1 tablet (6.25 mg total) by mouth in the morning, at noon, and at bedtime.    Dispense:  90 tablet    Refill:  1    Signed, Park Liter, MD, [facility] 07/08/2020 4:04 PM    [facility]

## 2020-07-08 NOTE — Patient Instructions (Signed)
Medication Instructions:  Your physician has recommended you make the following change in your medication:  INCREASE: Carvedilol to 6.25 mg 3 times daily.  *If you need a refill on your cardiac medications before your next appointment, please call your pharmacy*   Lab Work: None If you have labs (blood work) drawn today and your tests are completely normal, you will receive your results only by: Marland Kitchen MyChart Message (if you have MyChart) OR . A paper copy in the mail If you have any lab test that is abnormal or we need to change your treatment, we will call you to review the results.   Testing/Procedures: None   Follow-Up: At [facility], you and your health needs are our priority.  As part of our continuing mission to provide you with exceptional heart care, we have created designated Provider Care Teams.  These Care Teams include your primary Cardiologist (physician) and Advanced Practice Providers (APPs -  Physician Assistants and Nurse Practitioners) who all work together to provide you with the care you need, when you need it.  We recommend signing up for the patient portal called "MyChart".  Sign up information is provided on this After Visit Summary.  MyChart is used to connect with patients for Virtual Visits (Telemedicine).  Patients are able to view lab/test results, encounter notes, upcoming appointments, etc.  Non-urgent messages can be sent to your provider as well.   To learn more about what you can do with MyChart, go to [email or web address].    Your next appointment:   3 month(s)  The format for your next appointment:   In Person  Provider:   Jenne Campus, MD   Other Instructions

## 2020-07-08 NOTE — Telephone Encounter (Signed)
Pt called with c/o being in possible A fib, SOB, HR in 140's-150's and feeling sweaty. She is under a lot of stress due to her husband being hospitalized with covid and has is on day 8/10 of antibiotics for a sinus infection. Pt has been given an appt today with MD. She verbalized understanding of this appt.

## 2020-07-08 NOTE — Telephone Encounter (Signed)
STAT if HR is under 50 or over 120 (normal HR is 60-100 beats per minute)  1) What is your heart rate? 63 now but when she called [place] it was 155.  2) Do you have a log of your heart rate readings (document readings)? Ranging from 144-155.  3) Do you have any other symptoms? Dizziness and SOB. She states, "I just don't feel right"

## 2020-07-10 ENCOUNTER — Telehealth: Payer: Self-pay | Admitting: Emergency Medicine

## 2020-07-10 NOTE — Telephone Encounter (Signed)
Called patient per Dr. Wendy Poet request to see how she is feeling. She is feeling tired and weak. But her rhythm went back to normal however rate is high at 109. She does feel better overall. She recently increased carvedilol 6.25 mg from 2 times daily to 3 times daily. Will let Dr. Agustin Cree and DOD know just as fyi. Patient will call back if something changes or gets worse.

## 2020-07-15 ENCOUNTER — Other Ambulatory Visit: Payer: Self-pay | Admitting: Cardiology

## 2020-08-03 ENCOUNTER — Other Ambulatory Visit: Payer: Self-pay

## 2020-08-03 ENCOUNTER — Inpatient Hospital Stay: Payer: Medicare Other

## 2020-08-03 ENCOUNTER — Inpatient Hospital Stay (HOSPITAL_BASED_OUTPATIENT_CLINIC_OR_DEPARTMENT_OTHER): Payer: Medicare Other | Admitting: Family

## 2020-08-03 ENCOUNTER — Encounter: Payer: Self-pay | Admitting: Family

## 2020-08-03 ENCOUNTER — Inpatient Hospital Stay: Payer: Medicare Other | Attending: Hematology & Oncology

## 2020-08-03 VITALS — BP 135/80 | HR 88 | Temp 98.3°F | Resp 18 | Ht 60.0 in | Wt 157.8 lb

## 2020-08-03 DIAGNOSIS — N183 Chronic kidney disease, stage 3 unspecified: Secondary | ICD-10-CM | POA: Diagnosis not present

## 2020-08-03 DIAGNOSIS — Z853 Personal history of malignant neoplasm of breast: Secondary | ICD-10-CM | POA: Diagnosis not present

## 2020-08-03 DIAGNOSIS — D631 Anemia in chronic kidney disease: Secondary | ICD-10-CM | POA: Insufficient documentation

## 2020-08-03 DIAGNOSIS — N189 Chronic kidney disease, unspecified: Secondary | ICD-10-CM | POA: Insufficient documentation

## 2020-08-03 DIAGNOSIS — D508 Other iron deficiency anemias: Secondary | ICD-10-CM | POA: Diagnosis not present

## 2020-08-03 LAB — CBC WITH DIFFERENTIAL (CANCER CENTER ONLY)
Abs Immature Granulocytes: 0.08 10*3/uL — ABNORMAL HIGH (ref 0.00–0.07)
Basophils Absolute: 0 10*3/uL (ref 0.0–0.1)
Basophils Relative: 1 %
Eosinophils Absolute: 0.1 10*3/uL (ref 0.0–0.5)
Eosinophils Relative: 2 %
HCT: 37.2 % (ref 36.0–46.0)
Hemoglobin: 12 g/dL (ref 12.0–15.0)
Immature Granulocytes: 1 %
Lymphocytes Relative: 25 %
Lymphs Abs: 1.5 10*3/uL (ref 0.7–4.0)
MCH: 30.9 pg (ref 26.0–34.0)
MCHC: 32.3 g/dL (ref 30.0–36.0)
MCV: 95.9 fL (ref 80.0–100.0)
Monocytes Absolute: 0.4 10*3/uL (ref 0.1–1.0)
Monocytes Relative: 7 %
Neutro Abs: 3.9 10*3/uL (ref 1.7–7.7)
Neutrophils Relative %: 64 %
Platelet Count: 208 10*3/uL (ref 150–400)
RBC: 3.88 MIL/uL (ref 3.87–5.11)
RDW: 14.6 % (ref 11.5–15.5)
WBC Count: 6 10*3/uL (ref 4.0–10.5)
nRBC: 0 % (ref 0.0–0.2)

## 2020-08-03 LAB — CMP (CANCER CENTER ONLY)
ALT: 24 U/L (ref 0–44)
AST: 23 U/L (ref 15–41)
Albumin: 4.4 g/dL (ref 3.5–5.0)
Alkaline Phosphatase: 102 U/L (ref 38–126)
Anion gap: 10 (ref 5–15)
BUN: 31 mg/dL — ABNORMAL HIGH (ref 8–23)
CO2: 23 mmol/L (ref 22–32)
Calcium: 9.8 mg/dL (ref 8.9–10.3)
Chloride: 106 mmol/L (ref 98–111)
Creatinine: 1.15 mg/dL — ABNORMAL HIGH (ref 0.44–1.00)
GFR, Estimated: 53 mL/min — ABNORMAL LOW (ref >60)
Glucose, Bld: 222 mg/dL — ABNORMAL HIGH (ref 70–99)
Potassium: 4.3 mmol/L (ref 3.5–5.1)
Sodium: 139 mmol/L (ref 135–145)
Total Bilirubin: 0.4 mg/dL (ref 0.3–1.2)
Total Protein: 7.5 g/dL (ref 6.5–8.1)

## 2020-08-03 LAB — RETICULOCYTES
Immature Retic Fract: 20.4 % — ABNORMAL HIGH (ref 2.3–15.9)
RBC.: 3.89 MIL/uL (ref 3.87–5.11)
Retic Count, Absolute: 88.3 10*3/uL (ref 19.0–186.0)
Retic Ct Pct: 2.3 % (ref 0.4–3.1)

## 2020-08-03 LAB — IRON AND TIBC
Iron: 85 ug/dL (ref 41–142)
Saturation Ratios: 31 % (ref 21–57)
TIBC: 276 ug/dL (ref 236–444)
UIBC: 192 ug/dL (ref 120–384)

## 2020-08-03 LAB — FERRITIN: Ferritin: 394 ng/mL — ABNORMAL HIGH (ref 11–307)

## 2020-08-03 NOTE — Progress Notes (Signed)
Hematology and Oncology Follow Up Visit  [name] [id number] 01/20/1956 65 y.o. 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 Delaney is here today for follow-up. She states that she is feeling much better. Her husband is home from the hospital and they have been taking it easy.  She is getting over a sinus infection and UTI. She is currently on Augmentin.  She has mild SOB at times which she attributes to asthma exacerbation.  No bleeding, abnormal bruising or petechiae to report.  No fever, chills, n/v, cough, rash, dizziness, chest pain, palpitations, abdominal pain or changes in bowel or bladder habits.  No swelling, tenderness or numbness in her extremities. She has tingling in her fingers due to a chronic neck issue.  No falls or syncope.  She has maintained a good appetite and is staying well hydrated. Her weight is stable.   ECOG Performance Status: 1 - Symptomatic but completely ambulatory  Medications:  Allergies as of 08/03/2020      Reactions   Ativan [lorazepam] Other (See Comments)   Pt states she has taken before with no issue.   Pneumococcal Vaccines Other (See Comments)   Really high fever, and flu symptoms. MD instructed not to give   Cleocin [clindamycin Hcl] Other (See Comments)   "irritated my esophagus"   Morphine And Related Nausea And Vomiting   Other Nausea And Vomiting, Swelling   Shrimp if eat a lot   Buprenorphine Hcl Nausea And Vomiting   Pt does not know if she has ever had this medication      Medication List       Accurate as of August 03, 2020 10:51 AM. If you have any questions, ask your nurse or doctor.        acetaminophen 500 MG tablet Commonly known as: TYLENOL Take  500 mg by mouth every 6 (six) hours as needed for mild pain or headache.   albuterol 108 (90 Base) MCG/ACT inhaler Commonly known as: VENTOLIN HFA Inhale 2 puffs into the lungs as needed for wheezing or shortness of breath.   ALPRAZolam 0.25 MG tablet Commonly known as: XANAX Take 0.25 mg by mouth 3 (three) times daily as needed for anxiety.   amoxicillin-clavulanate 875-125 MG tablet Commonly known as: AUGMENTIN Take 1 tablet by mouth 2 (two) times daily.   atorvastatin 10 MG tablet Commonly known as: LIPITOR Take 10 mg by mouth daily.   carvedilol 6.25 MG tablet Commonly known as: COREG Take 1 tablet (6.25 mg total) by mouth in the morning, at noon, and at bedtime.   Eliquis 5 MG Tabs tablet Generic drug: apixaban TAKE 1 TABLET BY MOUTH TWICE A DAY   flecainide 50 MG tablet Commonly known as: TAMBOCOR TAKE 1 TABLET BY MOUTH TWICE A DAY   fluticasone 50 MCG/ACT nasal spray Commonly known as: FLONASE PLACE 1 SPRAY INTO BOTH NOSTRILS 2 (TWO) TIMES DAILY.   glipiZIDE 10 MG 24 hr tablet Commonly known as: GLUCOTROL XL Take 10 mg by mouth in the morning and at bedtime.   Glucagon Emergency 1 MG Kit Inject 1 mg into the muscle as needed.   loratadine 10 MG tablet Commonly known as: CLARITIN Take 10 mg by mouth daily as needed for allergies.  montelukast 10 MG tablet Commonly known as: SINGULAIR One a bedtime What changed:   how much to take  how to take this  when to take this  additional instructions   omeprazole 20 MG capsule Commonly known as: PRILOSEC Take 20 mg by mouth daily.   sertraline 50 MG tablet Commonly known as: ZOLOFT Take 50 mg by mouth daily.   vitamin C 1000 MG tablet Take 1,000 mg by mouth daily.   Vitamin D3 Adult Gummies 25 MCG (1000 UT) Chew Generic drug: Cholecalciferol Chew 2 each by mouth daily.   ZINC PO Take 1 tablet by mouth daily.       Allergies:  Allergies  Allergen Reactions  . Ativan [Lorazepam] Other (See  Comments)    Pt states she has taken before with no issue.  . Pneumococcal Vaccines Other (See Comments)    Really high fever, and flu symptoms. MD instructed not to give  . Cleocin [Clindamycin Hcl] Other (See Comments)    "irritated my esophagus"  . Morphine And Related Nausea And Vomiting  . Other Nausea And Vomiting and Swelling    Shrimp if eat a lot  . Buprenorphine Hcl Nausea And Vomiting    Pt does not know if she has ever had this medication    Past Medical History, Surgical history, Social history, and Family History were reviewed and updated.  Review of Systems: All other 10 point review of systems is negative.   Physical Exam:  vitals were not taken for this visit.   Wt Readings from Last 3 Encounters:  07/08/20 155 lb (70.3 kg)  07/02/20 155 lb 1.9 oz (70.4 kg)  06/02/20 156 lb (70.8 kg)    Ocular: Sclerae unicteric, pupils equal, round and reactive to light Ear-nose-throat: Oropharynx clear, dentition fair Lymphatic: No cervical or supraclavicular adenopathy Lungs no rales or rhonchi, good excursion bilaterally Heart regular rate and rhythm, no murmur appreciated Abd soft, nontender, positive bowel sounds MSK no focal spinal tenderness, no joint edema Neuro: non-focal, well-oriented, appropriate affect Breasts: Deferred   Lab Results  Component Value Date   WBC 6.0 08/03/2020   HGB 12.0 08/03/2020   HCT 37.2 08/03/2020   MCV 95.9 08/03/2020   PLT 208 08/03/2020   Lab Results  Component Value Date   FERRITIN 657 (H) 07/02/2020   IRON 60 07/02/2020   TIBC 244 07/02/2020   UIBC 184 07/02/2020   IRONPCTSAT 25 07/02/2020   Lab Results  Component Value Date   RETICCTPCT 2.3 08/03/2020   RBC 3.89 08/03/2020   No results found for: Nils Pyle [facility] Lab Results  Component Value Date   IGGSERUM 789 11/15/2016   IGA 150 11/15/2016   IGMSERUM 86 11/15/2016   No results found for: Odetta Pink, SPEI   Chemistry      Component Value Date/Time   NA 139 07/02/2020 0943   NA 140 05/01/2019 1536   NA 144 04/17/2017 1150   NA 141 04/18/2016 1256   K 4.5 07/02/2020 0943   K 4.6 04/17/2017 1150   K 4.4 04/18/2016 1256   CL 108 07/02/2020 0943   CL 109 (H) 04/17/2017 1150   CO2 21 (L) 07/02/2020 0943   CO2 23 04/17/2017 1150   CO2 18 (L) 04/18/2016 1256   BUN 20 07/02/2020 0943   BUN 38 (H) 05/01/2019 1536   BUN 28 (H) 04/17/2017 1150   BUN 27.3 (H) 04/18/2016 1256   CREATININE 1.18 (H) 07/02/2020  8097   CREATININE 1.3 (H) 04/17/2017 1150   CREATININE 1.4 (H) 04/18/2016 1256      Component Value Date/Time   CALCIUM 9.3 07/02/2020 0943   CALCIUM 9.6 04/17/2017 1150   CALCIUM 9.4 04/18/2016 1256   ALKPHOS 89 07/02/2020 0943   ALKPHOS 62 04/17/2017 1150   ALKPHOS 68 04/18/2016 1256   AST 15 07/02/2020 0943   AST 14 04/18/2016 1256   ALT 18 07/02/2020 0943   ALT 20 04/17/2017 1150   ALT 23 04/18/2016 1256   BILITOT 0.3 07/02/2020 0943   BILITOT <0.22 04/18/2016 1256       Impression and Plan: Ms. Space is a very pleasant 65 yo caucasian female with remote history of stage I ductal carcinoma of the left breastdiagnosed in 2003. She also has history of multifactorial anemia. No ESA needed for Hgb 12.0.  Iron studies are pending. We will replace if needed.  Follow-up in 6 weeks.  She can contact our office with any questions or concerns.   Laverna Peace, NP 1/31/202210:51 AM

## 2020-08-18 DIAGNOSIS — I509 Heart failure, unspecified: Secondary | ICD-10-CM | POA: Insufficient documentation

## 2020-08-19 ENCOUNTER — Ambulatory Visit (INDEPENDENT_AMBULATORY_CARE_PROVIDER_SITE_OTHER): Payer: Medicare Other | Admitting: Cardiology

## 2020-08-19 ENCOUNTER — Other Ambulatory Visit: Payer: Self-pay

## 2020-08-19 VITALS — BP 130/82 | HR 88 | Ht 59.0 in | Wt 159.0 lb

## 2020-08-19 DIAGNOSIS — I5022 Chronic systolic (congestive) heart failure: Secondary | ICD-10-CM | POA: Diagnosis not present

## 2020-08-19 DIAGNOSIS — I480 Paroxysmal atrial fibrillation: Secondary | ICD-10-CM

## 2020-08-19 DIAGNOSIS — I10 Essential (primary) hypertension: Secondary | ICD-10-CM | POA: Diagnosis not present

## 2020-08-19 NOTE — Addendum Note (Signed)
Addended by: Senaida Ores on: 08/19/2020 01:11 PM   Modules accepted: Orders

## 2020-08-19 NOTE — Progress Notes (Signed)
Cardiology Office Note:    Date:  08/19/2020   ID:  [name], DOB September 05, 1955, MRN [id number]  PCP:  [name]., MD  Cardiologist:  Jenne Campus, MD    Referring MD: [name]., MD   Chief Complaint  Patient presents with  . Atrial Fibrillation    History of Present Illness:    [name] is a 65 y.o. female with past medical history significant for paroxysmal atrial fibrillation, also hypertension diabetes history of cardiomyopathy, however latest echocardiogram done in summer of last year showed ejection fraction 6065%.  She started having more difficulties with controlling her atrial fibrillation initially she was taking flecainide 50 mg twice daily which seems to be controlling her atrial fibrillation quite well but then gradually with the time the episodes of atrial fibrillation became more frequent in the matter-of-fact last time I see her few weeks ago she was in atrial fibrillation with slightly accelerated ventricular rate.  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  We talked in length about the situation she got recurrent episodes of atrial fibrillation which make him feel horrible weak tired and exhausted.  I think we reached the point of more advanced management of atrial fibrillation as needed I think she can benefit from EP evaluation.  I will refer her to our EP team with consideration for possibility of atrial fibrillation ablation versus different antiarrhythmic therapy.  She is agreeing with the plan  Past Medical History:  Diagnosis Date  . Acquired thrombophilia ([place]) 01/29/2020  . AKI (acute kidney injury) ([place]) 11/20/2019  . Allergy 09/08/2016  . AMS (altered mental status)   . 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 Anemia, normocytic normochromic  07/27/2018  . Ankle joint pain 04/09/2018  . Arthritis    "back, knees, arms, wrists" (05/15/2013)  . Asthma   . Asymmetrical left sensorineural hearing loss 07/31/2019  . Atrial fibrillation ([place]) 11/15/2016  . Breast cancer ([place])   . C. difficile colitis   . Calculus of kidney 08/12/2012  . CHF (congestive heart failure) ([place])    "mild" (05/15/2013)  . CHF (congestive heart failure) ([place])    "mild" (05/15/2013)  . Chronic bronchitis ([place])   . Chronic lower back pain   . Chronic non-seasonal allergic rhinitis 11/15/2016  . Chronic systolic congestive heart failure ([place])   . Chronic UTI   . Cough variant asthma vs uacs  07/24/2017   Onset in her 20s some better p rx with allergy shots in her 30s Singulair trial 11/15/16 >  Improved 01/18/17:  FEV1 1.29 L (61%)  Ratio 75  Still on ACEi as of 07/24/2017  Spirometry 06/28/18   FEV1 1.3 (61%)  Ratio 77 with min curvature   - Allergy profile 07/26/2018 >  Eos 1.2 /  IgE  420  RAST Pos mold only   . Diabetes mellitus type 2 in obese ([place])   . Diarrhea 11/15/2016  . Dilated cardiomyopathy ([place]) 07/16/2019  . DOE (dyspnea on exertion) 07/27/2018   Onset early 2019 assoc with uacs while on ACEi - 06/28/18    Walked RA x one lap =  approx 250 ft - stopped due to sob with sats still high 90's     . Dysphagia   . Dysrhythmia    atrial fib/dr  [facility]  . Family history of anesthesia complication    "my mother also had PONV" (05/15/2013)  . Fever 10/29/2017  . Foot pain 04/09/2018  . GERD (gastroesophageal reflux disease)   . Heart murmur   . High cholesterol   . History of breast cancer in female 07/19/2013  . HTN (hypertension) 05/08/2013  . Hydronephrosis of right kidney   . Hyperlipidemia 11/15/2016  . Hyponatremia 10/29/2017  . IDA (iron deficiency anemia) 04/19/2017  . Infection   . Kidney stones   . Left breast mass 07/19/2013  . Low back pain 05/01/2018  . Migraine    "haven't had one in the early 2000's" (05/15/2013)  .  Mixed incontinence 07/24/2018  . Nephrolithiasis 11/15/2016  . Other chronic cystitis 05/19/2011  . PAF (paroxysmal atrial fibrillation) ([place])   . Personal history of chemotherapy   . Personal history of radiation therapy   . Pneumonia    "used to be chronic; last time I had it was 2013" (05/15/2013)  . PONV (postoperative nausea and vomiting)   . Recurrent sinusitis 09/08/2016  . Recurrent UTI 11/20/2014  . Recurrent UTI (urinary tract infection)    "from the continuous kidney stones; take Macrodantin qd" (05/15/2013)  . Respiratory failure requiring intubation ([place]) 05/08/2013  . Restrictive lung disease 01/18/2017  . Sacroiliac joint pain 05/16/2018  . Sepsis ([place]) 05   kidney stone infection  . Septic shock ([place]) 09/23/2018  . Simple chronic bronchitis ([place]) 11/15/2016  . Strep throat 10/29/2017  . Tachypnea   . Tension headache   . Type II diabetes mellitus ([place])   . Type II or unspecified type diabetes mellitus without mention of complication, not stated as uncontrolled 05/11/2013  . Urinary tract stones 05/19/2011  . Urinary urgency 07/24/2018    Past Surgical History:  Procedure Laterality Date  . 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 BREAST BIOPSY Left 02/2001  . BREAST LUMPECTOMY Left 02/2001  . BREAST LUMPECTOMY Left 09/23/2013   Procedure: LEFT LUMPECTOMY WITH SPECIMEN MAMMOGRAM;  Surgeon: Adin Hector, MD;  Location: [facility];  Service: General;  Laterality: Left;  . COLONOSCOPY    . DIAGNOSTIC LAPAROSCOPY     cyst-near ovary  . LITHOTRIPSY     "2-3 times prior to 2002" (05/15/2013)  . SPINAL FUSION N/A 05/08/2013   Procedure: T9-S1 INSTRUMENTED FUSION T12 -S1 DECOMPRESSION;  Surgeon: Melina Schools, MD;  Location: [facility];  Service: Orthopedics;  Laterality: N/A;  . TUBAL LIGATION Bilateral 1985    Current Medications: Current Meds  Medication Sig  . acetaminophen  (TYLENOL) 500 MG tablet Take 500 mg by mouth every 6 (six) hours as needed for mild pain or headache.   . albuterol (VENTOLIN HFA) 108 (90 Base) MCG/ACT inhaler Inhale 2 puffs into the lungs as needed for wheezing or shortness of breath.  . ALPRAZolam (XANAX) 0.25 MG tablet Take 0.25 mg by mouth 3 (three) times daily as needed for anxiety.   . Ascorbic Acid (VITAMIN C) 1000 MG tablet Take 1,000 mg by mouth daily.  Marland Kitchen atorvastatin (LIPITOR) 10 MG tablet Take 10 mg by mouth daily.  . carvedilol (COREG) 6.25 MG tablet Take 1 tablet (6.25 mg total) by mouth in the morning, at noon, and at bedtime.  . Cholecalciferol (VITAMIN D3 ADULT GUMMIES) 25 MCG (1000 UT) CHEW Chew 2 each by mouth daily.  Marland Kitchen ELIQUIS 5 MG TABS  tablet TAKE 1 TABLET BY MOUTH TWICE A DAY  . flecainide (TAMBOCOR) 50 MG tablet TAKE 1 TABLET BY MOUTH TWICE A DAY  . fluticasone (FLONASE) 50 MCG/ACT nasal spray PLACE 1 SPRAY INTO BOTH NOSTRILS 2 (TWO) TIMES DAILY.  Marland Kitchen glipiZIDE (GLUCOTROL XL) 10 MG 24 hr tablet Take 10 mg by mouth in the morning and at bedtime.   Marland Kitchen glucagon (GLUCAGON EMERGENCY) 1 MG injection Inject 1 mg into the muscle as needed (When glucose drop low levels).  . loratadine (CLARITIN) 10 MG tablet Take 10 mg by mouth daily as needed for allergies.  . montelukast (SINGULAIR) 10 MG tablet One a bedtime (Patient taking differently: Take 10 mg by mouth at bedtime.)  . Multiple Vitamins-Minerals (ZINC PO) Take 1 tablet by mouth daily.  Marland Kitchen omeprazole (PRILOSEC) 20 MG capsule Take 20 mg by mouth daily.   . sertraline (ZOLOFT) 50 MG tablet Take 50 mg by mouth daily.     Allergies:   Ativan [lorazepam], Pneumococcal vaccines, Cleocin [clindamycin hcl], Morphine and related, Other, and Buprenorphine hcl   Social History   Socioeconomic History  . Marital status: Married    Spouse name: Not on file  . Number of children: Not on file  . Years of education: Not on file  . Highest education level: Not on file  Occupational  History  . Not on file  Tobacco Use  . Smoking status: Passive Smoke Exposure - Never Smoker  . Smokeless tobacco: Never Used  . Tobacco comment: Mother & Father smoked  Vaping Use  . Vaping Use: Never used  Substance and Sexual Activity  . Alcohol use: No    Alcohol/week: 0.0 standard drinks  . Drug use: No  . Sexual activity: Yes  Other Topics Concern  . Not on file  Social History Narrative   [facility] (11/15/16):   Patient's originally from [place]. Has always lived in [place]. Currently has an outside dog. Remote exposure to [place] when her children were young. No known mold exposure. 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    Social Determinants of Health   Financial Resource Strain: Not on file  Food Insecurity: Not on file  Transportation Needs: Not on file  Physical Activity: Not on file  Stress: Not on file  Social Connections: Not on file     Family History: The patient's family history includes Breast cancer in her mother; COPD in her mother; Environmental Allergies in her son and another family member; Heart disease in her mother; Kidney cancer in her father; Liver cancer in her brother; Lupus in an other family member. ROS:   Please see the history of present illness.    All 14 point review of systems negative except as described per history of present illness  EKGs/Labs/Other Studies Reviewed:      Recent Labs: 08/03/2020: ALT 24; BUN 31; Creatinine 1.15; Hemoglobin 12.0; Platelet Count 208; Potassium 4.3; Sodium 139  Recent Lipid Panel    Component Value Date/Time   TRIG 155 (H) 05/08/2013 2023    Physical Exam:    VS:  BP 130/82 (BP Location: Right Arm, Patient Position: Sitting)   Pulse 88   Ht 4\' 11"  (1.499 m)   Wt 159 lb (72.1 kg)   SpO2 97%   BMI 32.11 kg/m     Wt Readings from Last 3 Encounters:  08/19/20 159 lb (72.1 kg)  08/03/20 157 lb 12.8 oz (71.6 kg)  07/08/20 155 lb  (  70.3 kg)     GEN:  Well nourished, well developed in no acute distress HEENT: Normal NECK: No JVD; No carotid bruits LYMPHATICS: No lymphadenopathy CARDIAC: RRR, no murmurs, no rubs, no gallops RESPIRATORY:  Clear to auscultation without rales, wheezing or rhonchi  ABDOMEN: Soft, non-tender, non-distended MUSCULOSKELETAL:  No edema; No deformity  SKIN: Warm and dry LOWER EXTREMITIES: no swelling NEUROLOGIC:  Alert and oriented x 3 PSYCHIATRIC:  Normal affect   ASSESSMENT:    1. PAF (paroxysmal atrial fibrillation) ([place])   2. Primary hypertension   3. Chronic systolic congestive heart failure (H[facility])    PLAN:    In order of problems listed above:  1. Paroxysmal atrial fibrillation today in sinus rhythm.  She is anticoagulant with Eliquis for chads 2 vascular equals 4, will continue.  Her AV node is suppressed with carvedilol normally she takes 6.25 twice daily however I told her if she goes to atrial fibrillation she can take 3 tablets of carvedilol daily, her antiarrhythmic control is not completely achieved with flecainide 50 mg twice daily however I am reluctant to increase the dose of this medication because of changes on the EKG she is she is already has. 2. Essential hypertension her blood pressure seems to be well controlled continue present management. 3. Diabetes to be followed by internal medicine team.  I do have her hemoglobin A1c from [facility] from Nov 21, 2019 being 6.9. 4. History of systolic congestive heart failure however last echocardiogram showed preserved left ventricle ejection fraction   Medication Adjustments/Labs and Tests Ordered: Current medicines are reviewed at length with the patient today.  Concerns regarding medicines are outlined above.  No orders of the defined types were placed in this encounter.  Medication changes: No orders of the defined types were placed in this encounter.   Signed, Park Liter, MD, [facility] 08/19/2020 1:04 PM    [place]

## 2020-08-19 NOTE — Patient Instructions (Signed)

## 2020-08-28 ENCOUNTER — Telehealth: Payer: Self-pay | Admitting: Cardiology

## 2020-08-28 NOTE — Telephone Encounter (Signed)
Yes she can see somebody else

## 2020-08-28 NOTE — Telephone Encounter (Signed)
New message:      Pt said Dr Agustin Cree had wanted her to see Dr Curt Bears in [place] for a sooner appt. The first available in [place] is May. Does he want her to see one of Dr Macky Lower APP in [place]?

## 2020-08-28 NOTE — Telephone Encounter (Signed)
Patient would like to switch from Dr. Agustin Cree to Dr. Martinique. Please let patient know what the office decides

## 2020-08-28 NOTE — Telephone Encounter (Signed)
Called patient informed her she can see on of Dr. Macky Lower APP's if needed.

## 2020-08-28 NOTE — Telephone Encounter (Signed)
Will forward message to Dr. Zerita Boers and Dr. Martinique for approval.

## 2020-08-28 NOTE — Telephone Encounter (Signed)
[place] with me.my

## 2020-08-31 NOTE — Telephone Encounter (Signed)
Fine with me

## 2020-09-01 ENCOUNTER — Other Ambulatory Visit: Payer: Self-pay

## 2020-09-01 ENCOUNTER — Encounter: Payer: Self-pay | Admitting: Internal Medicine

## 2020-09-01 ENCOUNTER — Ambulatory Visit (INDEPENDENT_AMBULATORY_CARE_PROVIDER_SITE_OTHER): Payer: Medicare Other | Admitting: Internal Medicine

## 2020-09-01 ENCOUNTER — Ambulatory Visit (INDEPENDENT_AMBULATORY_CARE_PROVIDER_SITE_OTHER): Payer: Medicare Other

## 2020-09-01 DIAGNOSIS — J45991 Cough variant asthma: Secondary | ICD-10-CM | POA: Diagnosis not present

## 2020-09-01 DIAGNOSIS — R0600 Dyspnea, unspecified: Secondary | ICD-10-CM

## 2020-09-01 DIAGNOSIS — R0609 Other forms of dyspnea: Secondary | ICD-10-CM

## 2020-09-01 LAB — SEDIMENTATION RATE: Sed Rate: 5 mm/hr (ref 0–30)

## 2020-09-01 LAB — BRAIN NATRIURETIC PEPTIDE: Pro B Natriuretic peptide (BNP): 1085 pg/mL — ABNORMAL HIGH (ref 0.0–100.0)

## 2020-09-01 NOTE — Patient Instructions (Addendum)
Only use your albuterol as a rescue medication to be used if you can't catch your breath by resting or doing a relaxed purse lip breathing pattern.  - The less you use it, the better it will work when you need it. - Ok to use up to 2 puffs  every 4 hours if you must but call for immediate appointment if use goes up over your usual need - Don't leave home without it !!  (think of it like the spare tire for your car)    Please remember to go to the lab and x-ray department  for your tests - we will call you with the results when they are available.  Pulmonary follow up is as needed

## 2020-09-01 NOTE — Assessment & Plan Note (Signed)
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   Still possible she has primary airway component rec saba prn  Only use your albuterol as a rescue medication to be used if you can't catch your breath by resting or doing a relaxed purse lip breathing pattern.  - The less you use it, the better it will work when you need it. - Ok to use up to 2 puffs  every 4 hours if you must but call for immediate appointment if use goes up over your usual need - Don't leave home without it !!  (think of it like the spare tire for your car)    Each maintenance medication was reviewed in detail including emphasizing most importantly the difference between maintenance and prns and under what circumstances the prns are to be triggered using an action plan format where appropriate.  Total time for H and P, chart review, counseling, reviewing hfa device(s) , directly observing portions of ambulatory 02 saturation study/ and generating customized AVS unique to this office visit / same day charting = 71min

## 2020-09-01 NOTE — Progress Notes (Addendum)
Subjective:     Patient ID: [name], female   DOB: 07-08-1955,    MRN: [id number]    Brief patient profile:  33  yowf never smoker with onset of resp problems in 20s started with nasal symptoms then freq pna with initial eval pos for allergies in her 30's on shots x sev years  and some better but recurrent "pna"  Each fall wih last eval by [name] 01/18/17     PFT 01/18/17: FVC 1.72 L (62%) FEV1 1.29 L (61%) FEV1/FVC 0.75  negative bronchodilator response   ERV 32% DLCO corrected 74%  IMAGING CT CHEST W/O 11/23/16  Minimal dependent atelectasis in the bases. No parenchymal nodule or opacity appreciated. No pleural effusion or thickening. No pericardial effusion. No pathologic mediastinal adenopathy.  LABS 11/15/16: IgG: 789 IgA: 150 IgM: 86 IgE: 618 RAST panel: [place] 0.26/[place] 0.11/Aspergillus fumigatus 1.84 CBC: 11.8/11.4/35.0/266 Eosinophils: 0.3      history of chronic bronchitis and remote diagnosis of asthma. At last appointment she had symptoms consistent with nonseasonal chronic allergic rhinitis as well as GERD. Lung volumes performed today shows no significant bronchodilator response but does show mild restrictive lung diseaseLikely due to her mild central obesity.  Her reflux seems to be controlled at this time. Her chronic allergic rhinitis is suboptimally controlled and would benefit from regular, daily use of intranasal corticosteroids. I instructed the patient to notify me if she had any new breathing problems or questions before next appointment.  1. Simple chronic bronchitis:Likely due to previous exacerbations of asthma. 2. Mild, persistent asthma: Continuing patient on Singulair 10 mg by mouth daily at bedtime. Continuing albuterol inhaler as needed. 3. Chronic nonseasonal allergic rhinitis: Patient intolerant of intranasal saline rinses. Recommended using Flonase 1 spray in each nostril twice daily. Continuing Singulair and Claritin. 4. GERD: Controlled  with Prilosec. No new medications at this time. 5. Mild restrictive lung disease: Likely secondary to obesity without evidence of fibrotic changes on CT imaging. No new testing. 6. Health maintenance: Status post influenza vaccine 2017. She is allergic to the Pneumovax.  7. Follow-up: Return to clinic in 6 months or sooner if needed.   07/24/2017  Transition of care extended  ov/[name]: establish [name] cough variant asthma vs uacs  ON ACEi Chief Complaint  Patient presents with  . Follow-up    Increased cough and SOB for the past 2 wks. She is having come sinus pressure, HA and left ear pain today. She is not producing any sputum.  She has been using her albuterol inhaler 2 x daily on average.   trending better over the last two weeks p "caught another cold" >>still having sensation of pnds  On chronic ACIEi/ cough worse when wakes up in am but s excess/ purulent sputum or mucus plugs  And usually not needing saba at all but presently at least 2x daily never noct rec When you have any respiratory flare:   Change the prilosec to take it 30 minutes before your first meal and add pepcid 20 mg at bedtime and do this until no cough x 1 week off all cough medications  For drainage / throat tickle try take CHLORPHENIRAMINE  4 mg - take one every 4 hours as needed - available over the counter- may cause drowsiness so start with just a bedtime dose or two and see how you tolerate it before trying in daytime   If not doing well  with respiratory symptoms and needing the albuterol more than  twice a week then  the next step is to find an alternative to your lisinopril per [name] then return after 6 weeks.   06/28/18 Alexandra rec [name] in office today - patient remained at 98% on RA the entire walk Spirometry in office today - stable from PFT 2 years ago Will order amoxicillin for sinusitis Will refill albuterol Please keep upcoming appointment with Cardiology Please follow up with [name] at his first  available appt in around 4 weeks     07/26/2018  f/u ov/[name]: uacs 100% better after last ov  Then ? Flu like illness 06/30/18 tamiflu Chief Complaint  Patient presents with  . Follow-up    SOB with exertion, cough with some mucus but now non-productive  Dyspnea:  Across parking lot  / no variability  Cough: at hs / min mucus Sleeping: pillows not bed blocks SABA use: once a week uses but really makes no difference  02: none  rec When you have any respiratory flare with cough or wheezing/ short of breath   In addition to  the prilosec to take it 30 minutes before your first meal and add pepcid 20 mg after supper  and do this until no cough x 1 week off all cough medications  For drainage / throat tickle try take CHLORPHENIRAMINE  4 mg - take one every 4 hours as needed - available over the counter  GERD diet      09/02/2019  f/u ov/[name]: uacs  Vs cough variant asthma  maint on singulair Chief Complaint  Patient presents with  . Follow-up    Breathing is about the same. She is coughing less. She rarely uses her albuterol inhaler.   Dyspnea:  Limited by back pain, sob carrying laundry/ steps ok at her house but only has 3 steps to go up Cough: less / some in am taking h1 x one at hs  Sleeping: adjusted bed x 30 degrees helps  SABA use: rarely  02: none  rec No change in medications  Only use your albuterol as a rescue medication Please schedule a follow up visit in 12 months but call sooner if needed      09/01/2020  f/u ov/[name]:  Post covid first symptoms 1st of Dec 2021 > monoclonal ab  Chief Complaint  Patient presents with  . Follow-up    Had covid 21 Jun 2020- having increased SOB since then. She gets winded walking short distances, esp if she ius carrying something. She is using her albuterol inhaler at least 2 x per day.   Dyspnea:  Not checking sats across the parking lot  Cough doing fine now s noct or am cough p h1 hs  Sleeping: sleeping fine p h1 hs  SABA  use: 30-45 degrees no resp symptoms 02: none  Covid status:   Never vax    No obvious day to day or daytime variability or assoc excess/ purulent sputum or mucus plugs or hemoptysis or cp or chest tightness, subjective wheeze or overt sinus or hb symptoms.   Sleeping as above  without nocturnal  or early am exacerbation  of respiratory  c/o's or need for noct saba. Also denies any obvious fluctuation of symptoms with weather or environmental changes or other aggravating or alleviating factors except as outlined above   No unusual exposure hx or h/o childhood pna/ asthma or knowledge of premature birth.  Current Allergies, Complete Past Medical History, Past Surgical History, Family History, and Social History  were reviewed in [facility] record.  ROS  The following are not active complaints unless bolded Hoarseness, sore throat, dysphagia, dental problems, itching, sneezing,  nasal congestion or discharge of excess mucus or purulent secretions, ear ache,   fever, chills, sweats, unintended wt loss or wt gain, classically pleuritic or exertional cp,  orthopnea pnd or arm/hand swelling  or leg swelling, presyncope, palpitations, abdominal pain, anorexia, nausea, vomiting, diarrhea  or change in bowel habits or change in bladder habits, change in stools or change in urine, dysuria, hematuria,  rash, arthralgias, visual complaints, headache, numbness, weakness or ataxia or problems with walking or coordination,  change in mood or  memory.        Current Meds  Medication Sig  . acetaminophen (TYLENOL) 500 MG tablet Take 500 mg by mouth every 6 (six) hours as needed for mild pain or headache.   . albuterol (VENTOLIN HFA) 108 (90 Base) MCG/ACT inhaler Inhale 2 puffs into the lungs as needed for wheezing or shortness of breath.  . ALPRAZolam (XANAX) 0.25 MG tablet Take 0.25 mg by mouth 3 (three) times daily as needed for anxiety.   . Ascorbic Acid (VITAMIN C) 1000 MG tablet Take  1,000 mg by mouth daily.  Marland Kitchen atorvastatin (LIPITOR) 10 MG tablet Take 10 mg by mouth daily.  . carvedilol (COREG) 6.25 MG tablet Take 1 tablet (6.25 mg total) by mouth in the morning, at noon, and at bedtime.  . Cholecalciferol (VITAMIN D3 ADULT GUMMIES) 25 MCG (1000 UT) CHEW Chew 2 each by mouth daily.  Marland Kitchen ELIQUIS 5 MG TABS tablet TAKE 1 TABLET BY MOUTH TWICE A DAY  . flecainide (TAMBOCOR) 50 MG tablet TAKE 1 TABLET BY MOUTH TWICE A DAY  . fluticasone (FLONASE) 50 MCG/ACT nasal spray PLACE 1 SPRAY INTO BOTH NOSTRILS 2 (TWO) TIMES DAILY.  Marland Kitchen glipiZIDE (GLUCOTROL XL) 10 MG 24 hr tablet Take 10 mg by mouth in the morning and at bedtime.   Marland Kitchen glucagon (GLUCAGON EMERGENCY) 1 MG injection Inject 1 mg into the muscle as needed (When glucose drop low levels).  . loratadine (CLARITIN) 10 MG tablet Take 10 mg by mouth daily as needed for allergies.  . montelukast (SINGULAIR) 10 MG tablet One a bedtime (Patient taking differently: Take 10 mg by mouth at bedtime.)  . Multiple Vitamins-Minerals (ZINC PO) Take 1 tablet by mouth daily.  Marland Kitchen omeprazole (PRILOSEC) 20 MG capsule Take 20 mg by mouth daily.   . sertraline (ZOLOFT) 50 MG tablet Take 50 mg by mouth daily.                    Objective:   Physical Exam   09/01/2020          163 09/02/2019          158   07/26/2018       159   07/24/17 150 lb 12.8 oz (68.4 kg)  05/22/17 146 lb 4 oz (66.3 kg)  04/17/17 147 lb (66.7 kg)       Vital signs reviewed  09/01/2020  - Note at rest 02 sats  97% on RA   General appearance:     Jovial amb wf nad    HEENT : pt wearing mask not removed for exam due to covid -19 concerns.    NECK :  without JVD/Nodes/TM/ nl carotid upstrokes bilaterally   LUNGS: no acc muscle use,  Nl contour chest which is clear to A and P bilaterally without cough  on insp or exp maneuvers   CV:  RRR  no s3 or murmur or increase in P2, and  Trace bialteral ankle  edema   ABD: obese/   soft and nontender with nl inspiratory excursion  in the supine position. No bruits or organomegaly appreciated, bowel sounds nl  MS:  Nl gait/ ext warm without deformities, calf tenderness, cyanosis or clubbing No obvious joint restrictions   SKIN: warm and dry without lesions    NEURO:  alert, approp, nl sensorium with  no motor or cerebellar deficits apparent.      CXR PA and Lateral:   09/01/2020 :    I personally reviewed images and  impression as follows:   Cardiomegaly s chf  Labs ordered/ reviewed:      Chemistry      Component Value Date/Time   NA 139 08/03/2020 1012   NA 140 05/01/2019 1536   NA 144 04/17/2017 1150   NA 141 04/18/2016 1256   K 4.3 08/03/2020 1012   K 4.6 04/17/2017 1150   K 4.4 04/18/2016 1256   CL 106 08/03/2020 1012   CL 109 (H) 04/17/2017 1150   CO2 23 08/03/2020 1012   CO2 23 04/17/2017 1150   CO2 18 (L) 04/18/2016 1256   BUN 31 (H) 08/03/2020 1012   BUN 38 (H) 05/01/2019 1536   BUN 28 (H) 04/17/2017 1150   BUN 27.3 (H) 04/18/2016 1256   CREATININE 1.15 (H) 08/03/2020 1012   CREATININE 1.3 (H) 04/17/2017 1150   CREATININE 1.4 (H) 04/18/2016 1256      Component Value Date/Time   CALCIUM 9.8 08/03/2020 1012   CALCIUM 9.6 04/17/2017 1150   CALCIUM 9.4 04/18/2016 1256   ALKPHOS 102 08/03/2020 1012   ALKPHOS 62 04/17/2017 1150   ALKPHOS 68 04/18/2016 1256   AST 23 08/03/2020 1012   AST 14 04/18/2016 1256   ALT 24 08/03/2020 1012   ALT 20 04/17/2017 1150   ALT 23 04/18/2016 1256   BILITOT 0.4 08/03/2020 1012   BILITOT <0.22 04/18/2016 1256        Lab Results  Component Value Date   WBC 6.0 08/03/2020   HGB 12.0 08/03/2020   HCT 37.2 08/03/2020   MCV 95.9 08/03/2020   PLT 208 08/03/2020        Lab Results  Component Value Date   PROBNP 1,085.0 (H) 09/01/2020       Lab Results  Component Value Date   ESRSEDRATE 5 09/01/2020            Assessment:

## 2020-09-01 NOTE — Progress Notes (Signed)
Tried calling the pt, no answer- LMTCB. 

## 2020-09-01 NOTE — Assessment & Plan Note (Addendum)
Onset early 2019 assoc with uacs while on ACEi - 06/28/18    Walked RA x one lap =  approx 250 ft - stopped due to sob with sats still high 90's    - echo 01/13/20 1. Left ventricular ejection fraction, by estimation, is 60 to 65%. The  left ventricle has normal function. The left ventricle has no regional  wall motion abnormalities. There is severe left ventricular hypertrophy of  the septal segment. Left ventricular diastolic parameters are consistent with Grade II diastolic  dysfunction (pseudonormalization).  2. Right ventricular systolic function is normal. The right ventricular  size is normal. There is normal pulmonary artery systolic pressure.  3. There is mild to moderate mitral annular calcification. Mild anterior  leaflet prolapse. Mild to moderate posterior directed mitral valve  regurgitation. No evidence of mitral stenosis.  4. Tricuspid valve regurgitation is moderate.  5. Suspect a unicuspid valve. Aortic valve regurgitation is not  visualized. No aortic stenosis is present.  6. The inferior vena cava is normal in size with greater than 50%  respiratory variability, suggesting right atrial pressure of 3 mmHg -  09/01/2020   Walked on RA  x  approx 175 ft -@ avg pace stopped due to sob/ sats still 95%  -  BNP 09/01/2020  = 1,085 > referred back to cardiology   Based on MR and diastolic dysfunction on echo and absence of evidence of a primary lung problem I now strongly suspect most of her symptoms are related to CHF and not lung dz > referred back to cards

## 2020-09-02 ENCOUNTER — Telehealth: Payer: Self-pay | Admitting: Internal Medicine

## 2020-09-02 ENCOUNTER — Telehealth: Payer: Self-pay | Admitting: Cardiology

## 2020-09-02 NOTE — Telephone Encounter (Signed)
Pt c/o Shortness Of Breath: STAT if SOB developed within the last 24 hours or pt is noticeably SOB on the phone  1. Are you currently SOB (can you hear that pt is SOB on the phone)?not at this time  w long have you been experiencing SOB? Worse the last 2 weeks 3. Are you SOB when sitting or when up moving around?  Moving around     3. Are you currently experiencing any other symptoms? Weak and a little dizziness at time, gain5 to 6lbs in the last week- pt sad her pulmonary doctor want her to see Dr Raliegh Ip- I made an appt with dr for tomorrow(09-03-20). She said she would talk to the nurse so she could be evaluated

## 2020-09-02 NOTE — Telephone Encounter (Signed)
Called patient [name] reports for the past 2 weeks [name] has been short of breat and it is getting worse. [name] does have some swelling to her lower extremities and has gained 5-6 pounds in a week to a week and a half. [name] is not on a diuretic due to kidney issues. [name] has appointment with Dr. Agustin Cree tomorrow but I will still send to him in case he has any recommendations now.

## 2020-09-02 NOTE — Telephone Encounter (Signed)
   Call made to patient, confirmed DOB. Made aware of lab results per MW. Voiced understanding. Patient is going to call her cardiologist and set up an appt. If she is unable to get an appt she will call us back.   Nothing further needed at this time.   Labs sent to cardiologist Jenne [facility].

## 2020-09-02 NOTE — Telephone Encounter (Signed)
Left message for patient to return call.

## 2020-09-02 NOTE — Telephone Encounter (Signed)
Follow up:     Patient returning a call back. Please call patient. 

## 2020-09-03 ENCOUNTER — Encounter: Payer: Self-pay | Admitting: Cardiology

## 2020-09-03 ENCOUNTER — Ambulatory Visit (INDEPENDENT_AMBULATORY_CARE_PROVIDER_SITE_OTHER): Payer: Medicare Other | Admitting: Cardiology

## 2020-09-03 ENCOUNTER — Other Ambulatory Visit: Payer: Self-pay

## 2020-09-03 VITALS — BP 138/76 | HR 87 | Ht 59.0 in | Wt 167.0 lb

## 2020-09-03 DIAGNOSIS — E669 Obesity, unspecified: Secondary | ICD-10-CM

## 2020-09-03 DIAGNOSIS — E1169 Type 2 diabetes mellitus with other specified complication: Secondary | ICD-10-CM | POA: Diagnosis not present

## 2020-09-03 DIAGNOSIS — I10 Essential (primary) hypertension: Secondary | ICD-10-CM | POA: Diagnosis not present

## 2020-09-03 DIAGNOSIS — I480 Paroxysmal atrial fibrillation: Secondary | ICD-10-CM

## 2020-09-03 DIAGNOSIS — I340 Nonrheumatic mitral (valve) insufficiency: Secondary | ICD-10-CM | POA: Insufficient documentation

## 2020-09-03 DIAGNOSIS — I5033 Acute on chronic diastolic (congestive) heart failure: Secondary | ICD-10-CM

## 2020-09-03 HISTORY — DX: Nonrheumatic mitral (valve) insufficiency: I34.0

## 2020-09-03 MED ORDER — POTASSIUM CHLORIDE ER 10 MEQ PO TBCR: 10.0000 meq | EXTENDED_RELEASE_TABLET | Freq: Every day | ORAL | 1 refills | Status: DC

## 2020-09-03 MED ORDER — FUROSEMIDE 40 MG PO TABS: 40.0000 mg | ORAL_TABLET | Freq: Every day | ORAL | 1 refills | Status: DC

## 2020-09-03 NOTE — Progress Notes (Signed)
Cardiology Office Note:    Date:  09/03/2020   ID:  [name], DOB [birth year], MRN [id number]  PCP:  Enid Skeens., MD  Cardiologist:  Jenne Campus, MD    Referring MD: Enid Skeens., MD   Chief Complaint  Patient presents with  . Shortness of Breath    History of Present Illness:    CIIN BRAZZEL is a 65 y.o. female with past medical history significant for paroxysmal atrial fibrillation, she is anticoagulated with Eliquis and she is suppressed with Tambocor.  Today she is in normal rhythm she does have diastolic congestive heart failure with moderate mitral regurgitation.  She came today to my office complaining of being short of breath shortness of breath is quite dramatically worse over the last 2 weeks she noticed also some weight gain as well as swelling of lower extremities.  She did have proBNP done which is highly elevated.  She denies have any chest pain tightness squeezing pressure burning chest no palpitations no dizziness no proximal nocturnal dyspnea.  Past Medical History:  Diagnosis Date  . Acquired thrombophilia ([place]) 01/29/2020  . AKI (acute kidney injury) ([place]) 11/20/2019  . Allergy 09/08/2016  . AMS (altered mental status)   . 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 Anemia, normocytic normochromic 07/27/2018  . Ankle joint pain 04/09/2018  . Arthritis    "back, knees, arms, wrists" (05/15/2013)  . Asthma   . Asymmetrical left sensorineural hearing loss 07/31/2019  . Atrial fibrillation ([place]) 11/15/2016  . Breast cancer ([place])   . C. difficile colitis   . Calculus of kidney 08/12/2012  . CHF (congestive heart failure) ([place])    "mild" (05/15/2013)  . CHF (congestive heart failure) ([place])    "mild" (05/15/2013)  . Chronic bronchitis ([place])   . Chronic lower back pain   . Chronic non-seasonal allergic rhinitis 11/15/2016  . Chronic systolic congestive heart failure ([place])   . Chronic UTI   . Cough variant asthma vs uacs  07/24/2017    Onset in her 20s some better p rx with allergy shots in her 30s Singulair trial 11/15/16 >  Improved 01/18/17:  FEV1 1.29 L (61%)  Ratio 75  Still on ACEi as of 07/24/2017  Spirometry 06/28/18   FEV1 1.3 (61%)  Ratio 77 with min curvature   - Allergy profile 07/26/2018 >  Eos 1.2 /  IgE  420  RAST Pos mold only   . Diabetes mellitus type 2 in obese ([place])   . Diarrhea 11/15/2016  . Dilated cardiomyopathy ([place]) 07/16/2019  . DOE (dyspnea on exertion) 07/27/2018   Onset early 2019 assoc with uacs while on ACEi - 06/28/18    Walked RA x one lap =  approx 250 ft - stopped due to sob with sats still high 90's     . Dysphagia   . Dysrhythmia    atrial fib/dr [facility]  . Family history of anesthesia complication    "my mother also had PONV" (05/15/2013)  . Fever 10/29/2017  . Foot pain 04/09/2018  . GERD (gastroesophageal reflux disease)   . Heart murmur   . High cholesterol   . History of breast cancer in female 07/19/2013  . HTN (hypertension) 05/08/2013  . Hydronephrosis of right kidney   . Hyperlipidemia 11/15/2016  . Hyponatremia 10/29/2017  . IDA (iron deficiency anemia) 04/19/2017  . Infection   . Kidney stones   . Left breast mass 07/19/2013  . Low back pain 05/01/2018  .  Migraine    "haven't had one in the early 2000's" (05/15/2013)  . Mixed incontinence 07/24/2018  . Nephrolithiasis 11/15/2016  . Other chronic cystitis 05/19/2011  . PAF (paroxysmal atrial fibrillation) ([place])   . Personal history of chemotherapy   . Personal history of radiation therapy   . Pneumonia    "used to be chronic; last time I had it was 2013" (05/15/2013)  . PONV (postoperative nausea and vomiting)   . Recurrent sinusitis 09/08/2016  . Recurrent UTI 11/20/2014  . Recurrent UTI (urinary tract infection)    "from the continuous kidney stones; take Macrodantin qd" (05/15/2013)  . Respiratory failure requiring intubation ([place]) 05/08/2013  . Restrictive lung disease 01/18/2017  . Sacroiliac joint  pain 05/16/2018  . Sepsis ([place]) 05   kidney stone infection  . Septic shock ([place]) 09/23/2018  . Simple chronic bronchitis ([place]) 11/15/2016  . Strep throat 10/29/2017  . Tachypnea   . Tension headache   . Type II diabetes mellitus ([place])   . Type II or unspecified type diabetes mellitus without mention of complication, not stated as uncontrolled 05/11/2013  . Urinary tract stones 05/19/2011  . Urinary urgency 07/24/2018    Past Surgical History:  Procedure Laterality Date  . 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 BREAST BIOPSY Left 02/2001  . BREAST LUMPECTOMY Left 02/2001  . BREAST LUMPECTOMY Left 09/23/2013   Procedure: LEFT LUMPECTOMY WITH SPECIMEN MAMMOGRAM;  Surgeon: Adin Hector, MD;  Location: [facility];  Service: General;  Laterality: Left;  . COLONOSCOPY    . DIAGNOSTIC LAPAROSCOPY     cyst-near ovary  . LITHOTRIPSY     "2-3 times prior to 2002" (05/15/2013)  . SPINAL FUSION N/A 05/08/2013   Procedure: T9-S1 INSTRUMENTED FUSION T12 -S1 DECOMPRESSION;  Surgeon: Melina Schools, MD;  Location: [facility];  Service: Orthopedics;  Laterality: N/A;  . TUBAL LIGATION Bilateral 1985    Current Medications: Current Meds  Medication Sig  . acetaminophen (TYLENOL) 500 MG tablet Take 500 mg by mouth every 6 (six) hours as needed for mild pain or headache.   . Ascorbic Acid (VITAMIN C) 1000 MG tablet Take 1,000 mg by mouth daily.  Marland Kitchen atorvastatin (LIPITOR) 10 MG tablet Take 10 mg by mouth daily.  . carvedilol (COREG) 6.25 MG tablet Take 1 tablet (6.25 mg total) by mouth in the morning, at noon, and at bedtime.  . Cholecalciferol (VITAMIN D3 ADULT GUMMIES) 25 MCG (1000 UT) CHEW Chew 2 each by mouth daily.  Marland Kitchen ELIQUIS 5 MG TABS tablet TAKE 1 TABLET BY MOUTH TWICE A DAY  . flecainide (TAMBOCOR) 50 MG tablet TAKE 1 TABLET BY MOUTH TWICE A DAY  . fluticasone (FLONASE) 50 MCG/ACT nasal spray PLACE  1 SPRAY INTO BOTH NOSTRILS 2 (TWO) TIMES DAILY. (Patient taking differently: Place 1 spray into both nostrils as needed for allergies.)  . furosemide (LASIX) 40 MG tablet Take 1 tablet (40 mg total) by mouth daily.  Marland Kitchen glipiZIDE (GLUCOTROL XL) 10 MG 24 hr tablet Take 10 mg by mouth in the morning and at bedtime.   Marland Kitchen glucagon (GLUCAGON EMERGENCY) 1 MG injection Inject 1 mg into the muscle as needed (When glucose drop low levels).  . loratadine (CLARITIN) 10 MG tablet Take 10 mg by mouth daily as needed for allergies.  . montelukast (SINGULAIR) 10 MG tablet One a bedtime (Patient taking differently: Take 10 mg by mouth  at bedtime.)  . Multiple Vitamins-Minerals (ZINC PO) Take 1 tablet by mouth daily.  Marland Kitchen omeprazole (PRILOSEC) 20 MG capsule Take 20 mg by mouth daily.   Marland Kitchen oxybutynin (DITROPAN-XL) 10 MG 24 hr tablet Take 10 mg by mouth daily.  . potassium chloride (KLOR-CON) 10 MEQ tablet Take 1 tablet (10 mEq total) by mouth daily.  . sertraline (ZOLOFT) 50 MG tablet Take 50 mg by mouth daily.  . [DISCONTINUED] albuterol (VENTOLIN HFA) 108 (90 Base) MCG/ACT inhaler Inhale 2 puffs into the lungs as needed for wheezing or shortness of breath.     Allergies:   Ativan [lorazepam], Pneumococcal vaccines, Cleocin [clindamycin hcl], Morphine and related, Other, and Buprenorphine hcl   Social History   Socioeconomic History  . Marital status: Married    Spouse name: Not on file  . Number of children: Not on file  . Years of education: Not on file  . Highest education level: Not on file  Occupational History  . Not on file  Tobacco Use  . Smoking status: Passive Smoke Exposure - Never Smoker  . Smokeless tobacco: Never Used  . Tobacco comment: Mother & Father smoked  Vaping Use  . Vaping Use: Never used  Substance and Sexual Activity  . Alcohol use: No    Alcohol/week: 0.0 standard drinks  . Drug use: No  . Sexual activity: Yes  Other Topics Concern  . Not on file  Social History Narrative    [facility] (11/15/16):   Patient's originally from [place]. Has always lived in [place]. Currently has an outside dog. Remote exposure to [place] when her children were young. No known mold exposure. 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    Social Determinants of Health   Financial Resource Strain: Not on file  Food Insecurity: Not on file  Transportation Needs: Not on file  Physical Activity: Not on file  Stress: Not on file  Social Connections: Not on file     Family History: The patient's family history includes Breast cancer in her mother; COPD in her mother; Environmental Allergies in her son and another family member; Heart disease in her mother; Kidney cancer in her father; Liver cancer in her brother; Lupus in an other family member. ROS:   Please see the history of present illness.    All 14 point review of systems negative except as described per history of present illness  EKGs/Labs/Other Studies Reviewed:      Recent Labs: 08/03/2020: ALT 24; BUN 31; Creatinine 1.15; Hemoglobin 12.0; Platelet Count 208; Potassium 4.3; Sodium 139 09/01/2020: Pro B Natriuretic peptide (BNP) 1,085.0  Recent Lipid Panel    Component Value Date/Time   TRIG 155 (H) 05/08/2013 2023    Physical Exam:    VS:  BP 138/76 (BP Location: Right Arm, Patient Position: Sitting)   Pulse 87   Ht 4\' 11"  (1.499 m)   Wt 167 lb (75.8 kg)   SpO2 97%   BMI 33.73 kg/m     Wt Readings from Last 3 Encounters:  09/03/20 167 lb (75.8 kg)  09/01/20 163 lb 3.2 oz (74 kg)  08/19/20 159 lb (72.1 kg)     GEN:  Well nourished, well developed in no acute distress HEENT: Normal NECK: No JVD; No carotid bruits LYMPHATICS: No lymphadenopathy CARDIAC: RRR, holosystolic murmur grade 1/6 to 2/6 best heard at apex, no rubs, no gallops RESPIRATORY:  Clear to auscultation without rales, wheezing or rhonchi  ABDOMEN: Soft,  non-tender,  non-distended MUSCULOSKELETAL:  No edema; No deformity  SKIN: Warm and dry LOWER EXTREMITIES: Minimal edema NEUROLOGIC:  Alert and oriented x 3 PSYCHIATRIC:  Normal affect   ASSESSMENT:    1. Acute on chronic diastolic congestive heart failure ([place])   2. PAF (paroxysmal atrial fibrillation) ([place])   3. Primary hypertension   4. Diabetes mellitus type 2 in obese ([place])   5. Nonrheumatic mitral valve regurgitation    PLAN:    In order of problems listed above:  1. Decompensated congestive heart failure.  Likely only mildly.  I hope I will be able to manage this as an outpatient.  I will give her 40 mg of Lasix today with 10 mEq of potassium, Chem-7 will be done today and then tomorrow we will adjust those medications.  I asked her to restrict some fluids that she apparently told me that she drinks a lot of to flush her kidneys I told her she need to stop.  Next week she will have another Chem-7 as well as proBNP done and I will see her back in my office in about week or 2.  She will be scheduled to have repeated echocardiogram.  I did recheck her echocardiogram from summer of last year which showed preserved ejection fraction moderate mitral regurgitation and diastolic dysfunction with left ventricle hypertrophy. 2. Essential hypertension blood pressure controlled continue present management. 3. Diabetes mellitus.  Stable followed by internal medicine team. 4. Paroxysmal atrial fibrillation EKG confirmed presence of sinus rhythm today.  We will continue present management which include anticoagulation and antiarrhythmic therapy.  This strategy is rhythm control   Medication Adjustments/Labs and Tests Ordered: Current medicines are reviewed at length with the patient today.  Concerns regarding medicines are outlined above.  Orders Placed This Encounter  Procedures  . Basic metabolic panel  . EKG 12-Lead  . ECHOCARDIOGRAM COMPLETE   Medication changes:  Meds ordered this encounter   Medications  . furosemide (LASIX) 40 MG tablet    Sig: Take 1 tablet (40 mg total) by mouth daily.    Dispense:  90 tablet    Refill:  1  . potassium chloride (KLOR-CON) 10 MEQ tablet    Sig: Take 1 tablet (10 mEq total) by mouth daily.    Dispense:  90 tablet    Refill:  1    Signed, Park Liter, MD, [facility] 09/03/2020 3:41 PM    [place]

## 2020-09-03 NOTE — Patient Instructions (Signed)
Medication Instructions:  Your physician has recommended you make the following change in your medication:   START: Lasix 40 mg daily   START: Potassium 10 meq daily   *If you need a refill on your cardiac medications before your next appointment, please call your pharmacy*   Lab Work: Your physician recommends that you return for lab work today: bmp   If you have labs (blood work) drawn today and your tests are completely normal, you will receive your results only by: Marland Kitchen MyChart Message (if you have MyChart) OR . A paper copy in the mail If you have any lab test that is abnormal or we need to change your treatment, we will call you to review the results.   Testing/Procedures: Your physician has requested that you have an echocardiogram. Echocardiography is a painless test that uses sound waves to create images of your heart. It provides your doctor with information about the size and shape of your heart and how well your heart's chambers and valves are working. This procedure takes approximately one hour. There are no restrictions for this procedure.     Follow-Up: At [facility], you and your health needs are our priority.  As part of our continuing mission to provide you with exceptional heart care, we have created designated Provider Care Teams.  These Care Teams include your primary Cardiologist (physician) and Advanced Practice Providers (APPs -  Physician Assistants and Nurse Practitioners) who all work together to provide you with the care you need, when you need it.  We recommend signing up for the patient portal called "MyChart".  Sign up information is provided on this After Visit Summary.  MyChart is used to connect with patients for Virtual Visits (Telemedicine).  Patients are able to view lab/test results, encounter notes, upcoming appointments, etc.  Non-urgent messages can be sent to your provider as well.   To learn more about what you can do with MyChart, go to  [email or web address].    Your next appointment:   2 week(s)  The format for your next appointment:   In Person  Provider:   Jenne Campus, MD   Other Instructions   Echocardiogram An echocardiogram is a test that uses sound waves (ultrasound) to produce images of the heart. Images from an echocardiogram can provide important information about:  Heart size and shape.  The size and thickness and movement of your heart's walls.  Heart muscle function and strength.  Heart valve function or if you have stenosis. Stenosis is when the heart valves are too narrow.  If blood is flowing backward through the heart valves (regurgitation).  A tumor or infectious growth around the heart valves.  Areas of heart muscle that are not working well because of poor blood flow or injury from a heart attack.  Aneurysm detection. An aneurysm is a weak or damaged part of an artery wall. The wall bulges out from the normal force of blood pumping through the body. Tell a health care provider about:  Any allergies you have.  All medicines you are taking, including vitamins, herbs, eye drops, creams, and over-the-counter medicines.  Any blood disorders you have.  Any surgeries you have had.  Any medical conditions you have.  Whether you are pregnant or may be pregnant. What are the risks? Generally, this is a safe test. However, problems may occur, including an allergic reaction to dye (contrast) that may be used during the test. What happens before the test? No specific preparation is needed. You  may eat and drink normally. What happens during the test?  You will take off your clothes from the waist up and put on a hospital gown.  Electrodes or electrocardiogram (ECG)patches may be placed on your chest. The electrodes or patches are then connected to a device that monitors your heart rate and rhythm.  You will lie down on a table for an ultrasound exam. A gel will be applied to  your chest to help sound waves pass through your skin.  A handheld device, called a transducer, will be pressed against your chest and moved over your heart. The transducer produces sound waves that travel to your heart and bounce back (or "echo" back) to the transducer. These sound waves will be captured in real-time and changed into images of your heart that can be viewed on a video monitor. The images will be recorded on a computer and reviewed by your health care provider.  You may be asked to change positions or hold your breath for a short time. This makes it easier to get different views or better views of your heart.  In some cases, you may receive contrast through an IV in one of your veins. This can improve the quality of the pictures from your heart. The procedure may vary among health care providers and hospitals.   What can I expect after the test? You may return to your normal, everyday life, including diet, activities, and medicines, unless your health care provider tells you not to do that. Follow these instructions at home:  It is up to you to get the results of your test. Ask your health care provider, or the department that is doing the test, when your results will be ready.  Keep all follow-up visits. This is important. Summary  An echocardiogram is a test that uses sound waves (ultrasound) to produce images of the heart.  Images from an echocardiogram can provide important information about the size and shape of your heart, heart muscle function, heart valve function, and other possible heart problems.  You do not need to do anything to prepare before this test. You may eat and drink normally.  After the echocardiogram is completed, you may return to your normal, everyday life, unless your health care provider tells you not to do that. This information is not intended to replace advice given to you by your health care provider. Make sure you discuss any questions you have  with your health care provider. Document Revised: 02/11/2020 Document Reviewed: 02/11/2020 Elsevier Patient Education  2021 [place].  Furosemide Oral Tablets What is this medicine? FUROSEMIDE (fyoor [place] se mide) is a diuretic. It helps you make more urine and to lose salt and excess water from your body. It treats swelling from heart, kidney, or liver disease. It also treats high blood pressure. This medicine may be used for other purposes; ask your health care provider or pharmacist if you have questions. COMMON BRAND NAME(S): Active-Medicated Specimen Kit, Delone, Diuscreen, Lasix, RX Specimen Collection Kit, Specimen Collection Kit, URINX Medicated Specimen Collection What should I tell my health care provider before I take this medicine? They need to know if you have any of these conditions:  abnormal blood electrolytes  diarrhea or vomiting  gout  heart disease  kidney disease, small amounts of urine, or difficulty passing urine  liver disease  thyroid disease  an unusual or allergic reaction to furosemide, sulfa drugs, other medicines, foods, dyes, or preservatives  pregnant or trying to get pregnant  breast-feeding How should I use this medicine? Take this drug by mouth. Take it as directed on the prescription label at the same time every day. You can take it with or without food. If it upsets your stomach, take it with food. Keep taking it unless your health care provider tells you to stop. Talk to your health care provider about the use of this drug in children. Special care may be needed. Overdosage: If you think you have taken too much of this medicine contact a poison control center or emergency room at once. NOTE: This medicine is only for you. Do not share this medicine with others. What if I miss a dose? If you miss a dose, take it as soon as you can. If it is almost time for your next dose, take only that dose. Do not take double or extra doses. What may  interact with this medicine?  aspirin and aspirin-like medicines  certain antibiotics  chloral hydrate  cisplatin  cyclosporine  digoxin  diuretics  laxatives  lithium  medicines for blood pressure  medicines that relax muscles for surgery  methotrexate  NSAIDs, medicines for pain and inflammation like ibuprofen, naproxen, or indomethacin  phenytoin  steroid medicines like prednisone or cortisone  sucralfate  thyroid hormones This list may not describe all possible interactions. Give your health care provider a list of all the medicines, herbs, non-prescription drugs, or dietary supplements you use. Also tell them if you smoke, drink alcohol, or use illegal drugs. Some items may interact with your medicine. What should I watch for while using this medicine? Visit your doctor or health care providerfor regular checks on your progress. Check your blood pressure regularly. Ask your doctor or health care providerwhat your blood pressure should be, and when you should contact him or her. If you are a diabetic, check your blood sugar as directed. This medicine may cause serious skin reactions. They can happen weeks to months after starting the medicine. Contact your health care provider right away if you notice fevers or flu-like symptoms with a rash. The rash may be red or purple and then turn into blisters or peeling of the skin. Or, you might notice a red rash with swelling of the face, lips or lymph nodes in your neck or under your arms. You may need to be on a special diet while taking this medicine. Check with your doctor. Also, ask how many glasses of fluid you need to drink a day. You must not get dehydrated. You may get drowsy or dizzy. Do not drive, use machinery, or do anything that needs mental alertness until you know how this drug affects you. Do not stand or sit up quickly, especially if you are an older patient. This reduces the risk of dizzy or fainting spells.  Alcohol can make you more drowsy and dizzy. Avoid alcoholic drinks. This medicine can make you more sensitive to the sun. Keep out of the sun. If you cannot avoid being in the sun, wear protective clothing and use sunscreen. Do not use sun lamps or tanning beds/booths. What side effects may I notice from receiving this medicine? Side effects that you should report to your doctor or health care professional as soon as possible:  blood in urine or stools  dry mouth  fever or chills  hearing loss or ringing in the ears  irregular heartbeat  muscle pain or weakness, cramps  rash, fever, and swollen lymph nodes  redness, blistering, peeling or loosening of the  skin, including inside the mouth  skin rash  stomach upset, pain, or nausea  tingling or numbness in the hands or feet  unusually weak or tired  vomiting or diarrhea  yellowing of the eyes or skin Side effects that usually do not require medical attention (report to your doctor or health care professional if they continue or are bothersome):  headache  loss of appetite  unusual bleeding or bruising This list may not describe all possible side effects. Call your doctor for medical advice about side effects. You may report side effects to FDA at 1-800-FDA-1088. Where should I keep my medicine? Keep out of the reach of children and pets. Store at room temperature between 20 and 25 degrees C (68 and 77 degrees F). Protect from light and moisture. Keep the container tightly closed. Throw away any unused drug after the expiration date. NOTE: This sheet is a summary. It may not cover all possible information. If you have questions about this medicine, talk to your doctor, pharmacist, or health care provider.  2021 Elsevier/Gold Standard (2019-02-05 18:01:32)

## 2020-09-03 NOTE — Telephone Encounter (Signed)
Patient seen today

## 2020-09-04 LAB — BASIC METABOLIC PANEL
BUN/Creatinine Ratio: 17 (ref 12–28)
BUN: 26 mg/dL (ref 8–27)
CO2: 17 mmol/L — ABNORMAL LOW (ref 20–29)
Calcium: 9.3 mg/dL (ref 8.7–10.3)
Chloride: 106 mmol/L (ref 96–106)
Creatinine, Ser: 1.49 mg/dL — ABNORMAL HIGH (ref 0.57–1.00)
Glucose: 188 mg/dL — ABNORMAL HIGH (ref 65–99)
Potassium: 4.1 mmol/L (ref 3.5–5.2)
Sodium: 140 mmol/L (ref 134–144)
eGFR: 39 mL/min/{1.73_m2} — ABNORMAL LOW (ref >59)

## 2020-09-07 ENCOUNTER — Telehealth: Payer: Self-pay | Admitting: Emergency Medicine

## 2020-09-07 DIAGNOSIS — Z79899 Other long term (current) drug therapy: Secondary | ICD-10-CM

## 2020-09-07 NOTE — Telephone Encounter (Signed)
-----   Message from Park Liter, MD sent at 09/04/2020  8:58 AM EST ----- So yesterday we gave her 40 mg of Lasix with 10 mg potassium.  Let us continue this for another 2 days meaning Saturday and Sunday and then go down to only 20 mg of furosemide and 10 mEq of potassium every other day.  She needs to have a Chem-7 done at the beginning of next week

## 2020-09-07 NOTE — Telephone Encounter (Signed)
Spoke with patient to follow up on Provider Switch. Patient wanted to switch EP providers with the hope of getting an appointment sooner. Patient will stay with Dr. Curt Bears but her appointment has been moved up to 3.29.22 in [place].  Patient plans on staying with Dr. Raliegh Ip in [place]

## 2020-09-07 NOTE — Progress Notes (Signed)
Pt notified of results- see phone note 09/02/20.

## 2020-09-07 NOTE — Telephone Encounter (Signed)
Called patient informed her to decrease lasix to 20 mg daily and potassium 10 meq only every other day. She understood no further questions. She will repeat labs tomorrow.

## 2020-09-09 LAB — BASIC METABOLIC PANEL
BUN/Creatinine Ratio: 28 (ref 12–28)
BUN: 41 mg/dL — ABNORMAL HIGH (ref 8–27)
CO2: 18 mmol/L — ABNORMAL LOW (ref 20–29)
Calcium: 8 mg/dL — ABNORMAL LOW (ref 8.7–10.3)
Chloride: 100 mmol/L (ref 96–106)
Creatinine, Ser: 1.46 mg/dL — ABNORMAL HIGH (ref 0.57–1.00)
Glucose: 230 mg/dL — ABNORMAL HIGH (ref 65–99)
Potassium: 4 mmol/L (ref 3.5–5.2)
Sodium: 141 mmol/L (ref 134–144)
eGFR: 40 mL/min/{1.73_m2} — ABNORMAL LOW (ref >59)

## 2020-09-14 ENCOUNTER — Telehealth: Payer: Self-pay

## 2020-09-14 ENCOUNTER — Inpatient Hospital Stay: Payer: Medicare Other | Admitting: Family

## 2020-09-14 ENCOUNTER — Inpatient Hospital Stay: Payer: Medicare Other

## 2020-09-14 NOTE — Telephone Encounter (Signed)
Pt called in and s/w [name] and advised that she was not feeling well.  She will c/b to r/s when she feels better   [name]

## 2020-09-22 DIAGNOSIS — J42 Unspecified chronic bronchitis: Secondary | ICD-10-CM | POA: Insufficient documentation

## 2020-09-22 DIAGNOSIS — M1990 Unspecified osteoarthritis, unspecified site: Secondary | ICD-10-CM | POA: Insufficient documentation

## 2020-09-23 ENCOUNTER — Other Ambulatory Visit: Payer: Self-pay

## 2020-09-23 ENCOUNTER — Encounter: Payer: Self-pay | Admitting: Cardiology

## 2020-09-23 ENCOUNTER — Ambulatory Visit (INDEPENDENT_AMBULATORY_CARE_PROVIDER_SITE_OTHER): Payer: Medicare Other | Admitting: Cardiology

## 2020-09-23 VITALS — BP 102/64 | HR 80 | Ht 59.0 in | Wt 151.0 lb

## 2020-09-23 DIAGNOSIS — I5022 Chronic systolic (congestive) heart failure: Secondary | ICD-10-CM | POA: Diagnosis not present

## 2020-09-23 DIAGNOSIS — I480 Paroxysmal atrial fibrillation: Secondary | ICD-10-CM | POA: Diagnosis not present

## 2020-09-23 DIAGNOSIS — I420 Dilated cardiomyopathy: Secondary | ICD-10-CM | POA: Diagnosis not present

## 2020-09-23 DIAGNOSIS — Z79899 Other long term (current) drug therapy: Secondary | ICD-10-CM

## 2020-09-23 DIAGNOSIS — I10 Essential (primary) hypertension: Secondary | ICD-10-CM

## 2020-09-23 NOTE — Patient Instructions (Signed)
Medication Instructions:  Your physician has recommended you make the following change in your medication:  STOP : Lasix   START Lasix back on Sunday if necessary given your weight.  *If you need a refill on your cardiac medications before your next appointment, please call your pharmacy*   Lab Work: Your physician recommends that you return for lab work today: bmp   If you have labs (blood work) drawn today and your tests are completely normal, you will receive your results only by: Marland Kitchen MyChart Message (if you have MyChart) OR . A paper copy in the mail If you have any lab test that is abnormal or we need to change your treatment, we will call you to review the results.   Testing/Procedures: None   Follow-Up: At [facility], you and your health needs are our priority.  As part of our continuing mission to provide you with exceptional heart care, we have created designated Provider Care Teams.  These Care Teams include your primary Cardiologist (physician) and Advanced Practice Providers (APPs -  Physician Assistants and Nurse Practitioners) who all work together to provide you with the care you need, when you need it.  We recommend signing up for the patient portal called "MyChart".  Sign up information is provided on this After Visit Summary.  MyChart is used to connect with patients for Virtual Visits (Telemedicine).  Patients are able to view lab/test results, encounter notes, upcoming appointments, etc.  Non-urgent messages can be sent to your provider as well.   To learn more about what you can do with MyChart, go to [email or web address].    Your next appointment:   2 month(s)  The format for your next appointment:   In Person  Provider:   Jenne Campus, MD   Other Instructions

## 2020-09-23 NOTE — Progress Notes (Signed)
Cardiology Office Note:    Date:  09/23/2020   ID:  Icyss, Skog 10/21/1955, MRN [id number]  PCP:  Enid Skeens., MD  Cardiologist:  Jenne Campus, MD    Referring MD: Enid Skeens., MD   Chief Complaint  Patient presents with  . Follow-up  More shortness of breath and swelling of lower extremities gone but I am weak tired and exhausted  History of Present Illness:    [name] is a 65 y.o. female with past medical history significant for paroxysmal atrial fibrillation, her chads 2 Vascor equals 4, she is anticoagulated Eliquis and suppressed with Tambocor, also a history of cardiomyopathy however left ventricle ejection fraction normalized, she does have diastolic dysfunction there is also anterior leaflet of the mitral valve prolapse with mild to moderate mitral regurgitation. I did see her last time about 2 weeks ago when she came to me complaining of having shortness of breath and swelling of lower extremities.  This problem developed within 2 weeks before presentation to my office last time.  I will put her on small dose of diuretic ask her to start taking 40 mg of Lasix every day for 3 days and then cut down the dose to 20 mg daily she comes today to my office for follow-up.  She said in terms of respiration as well as swelling of lower extremities she feels dramatically better but overall she feels weak tired exhausted and she also complained of having dizziness especially she getting up quickly.  I have not afraid with dealing now with dehydration.  Past Medical History:  Diagnosis Date  . Acquired thrombophilia ([place]) 01/29/2020  . AKI (acute kidney injury) ([place]) 11/20/2019  . Allergy 09/08/2016  . AMS (altered mental status)   . Anemia in end-stage renal disease ([place]) 07/16/2019  . Anemia, normocytic normochromic 07/27/2018  . Ankle joint pain 04/09/2018  . Arthritis    "back, knees, arms, wrists" (05/15/2013)  . Asthma   . Asymmetrical left sensorineural  hearing loss 07/31/2019  . Atrial fibrillation ([place]) 11/15/2016  . Breast cancer ([place])   . C. difficile colitis   . Calculus of kidney 08/12/2012  . CHF (congestive heart failure) ([place])    "mild" (05/15/2013)  . CHF (congestive heart failure) ([place])    "mild" (05/15/2013)  . Chronic bronchitis ([place])   . Chronic lower back pain   . Chronic non-seasonal allergic rhinitis 11/15/2016  . Chronic systolic congestive heart failure ([place])   . Chronic UTI   . Cough variant asthma vs uacs  07/24/2017   Onset in her 20s some better p rx with allergy shots in her 30s Singulair trial 11/15/16 >  Improved 01/18/17:  FEV1 1.29 L (61%)  Ratio 75  Still on ACEi as of 07/24/2017  Spirometry 06/28/18   FEV1 1.3 (61%)  Ratio 77 with min curvature   - Allergy profile 07/26/2018 >  Eos 1.2 /  IgE  420  RAST Pos mold only   . Diabetes mellitus type 2 in obese ([place])   . Diarrhea 11/15/2016  . Dilated cardiomyopathy ([place]) 07/16/2019  . DOE (dyspnea on exertion) 07/27/2018   Onset early 2019 assoc with uacs while on ACEi - 06/28/18    Walked RA x one lap =  approx 250 ft - stopped due to sob with sats still high 90's     . Dysphagia   . Dysrhythmia    atrial fib/dr [place] [facility]  . Family history of anesthesia complication    "  my mother also had PONV" (05/15/2013)  . Fever 10/29/2017  . Foot pain 04/09/2018  . GERD (gastroesophageal reflux disease)   . Heart murmur   . High cholesterol   . History of breast cancer in female 07/19/2013  . HTN (hypertension) 05/08/2013  . Hydronephrosis of right kidney   . Hyperlipidemia 11/15/2016  . Hyponatremia 10/29/2017  . IDA (iron deficiency anemia) 04/19/2017  . Infection   . Kidney stones   . Left breast mass 07/19/2013  . Low back pain 05/01/2018  . Migraine    "haven't had one in the early 2000's" (05/15/2013)  . Mitral regurgitation 09/03/2020  . Mixed incontinence 07/24/2018  . Nephrolithiasis 11/15/2016  . Other chronic cystitis 05/19/2011  . PAF (paroxysmal  atrial fibrillation) ([place])   . Personal history of chemotherapy   . Personal history of radiation therapy   . Pneumonia    "used to be chronic; last time I had it was 2013" (05/15/2013)  . PONV (postoperative nausea and vomiting)   . Recurrent sinusitis 09/08/2016  . Recurrent UTI 11/20/2014  . Recurrent UTI (urinary tract infection)    "from the continuous kidney stones; take Macrodantin qd" (05/15/2013)  . Respiratory failure requiring intubation ([place]) 05/08/2013  . Restrictive lung disease 01/18/2017  . Sacroiliac joint pain 05/16/2018  . Sepsis ([place]) 05   kidney stone infection  . Septic shock ([place]) 09/23/2018  . Simple chronic bronchitis ([place]) 11/15/2016  . Strep throat 10/29/2017  . Tachypnea   . Tension headache   . Type II diabetes mellitus ([place])   . Type II or unspecified type diabetes mellitus without mention of complication, not stated as uncontrolled 05/11/2013  . Urinary tract stones 05/19/2011  . Urinary urgency 07/24/2018    Past Surgical History:  Procedure Laterality Date  . 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 BREAST BIOPSY Left 02/2001  . BREAST LUMPECTOMY Left 02/2001  . BREAST LUMPECTOMY Left 09/23/2013   Procedure: LEFT LUMPECTOMY WITH SPECIMEN MAMMOGRAM;  Surgeon: Adin Hector, MD;  Location: [facility];  Service: General;  Laterality: Left;  . COLONOSCOPY    . DIAGNOSTIC LAPAROSCOPY     cyst-near ovary  . LITHOTRIPSY     "2-3 times prior to 2002" (05/15/2013)  . SPINAL FUSION N/A 05/08/2013   Procedure: T9-S1 INSTRUMENTED FUSION T12 -S1 DECOMPRESSION;  Surgeon: Melina Schools, MD;  Location: [facility];  Service: Orthopedics;  Laterality: N/A;  . TUBAL LIGATION Bilateral 1985    Current Medications: Current Meds  Medication Sig  . acetaminophen (TYLENOL) 500 MG tablet Take 500 mg by mouth every 6 (six) hours as needed for mild pain or headache.   . Ascorbic  Acid (VITAMIN C) 1000 MG tablet Take 1,000 mg by mouth daily.  Marland Kitchen atorvastatin (LIPITOR) 10 MG tablet Take 10 mg by mouth daily.  . carvedilol (COREG) 6.25 MG tablet Take 1 tablet (6.25 mg total) by mouth in the morning, at noon, and at bedtime. (Patient taking differently: Take 6.25 mg by mouth in the morning and at bedtime. Tid if she AFIB)  . ELIQUIS 5 MG TABS tablet TAKE 1 TABLET BY MOUTH TWICE A DAY (Patient taking differently: Take 5 mg by mouth 2 (two) times daily.)  . flecainide (TAMBOCOR) 50 MG tablet TAKE 1 TABLET BY MOUTH TWICE A DAY (Patient taking differently: Take 50 mg by mouth 2 (two) times daily.)  . fluticasone (FLONASE) 50  MCG/ACT nasal spray PLACE 1 SPRAY INTO BOTH NOSTRILS 2 (TWO) TIMES DAILY. (Patient taking differently: Place 1 spray into both nostrils as needed for allergies.)  . furosemide (LASIX) 40 MG tablet Take 1 tablet (40 mg total) by mouth daily. (Patient taking differently: Take 20 mg by mouth daily.)  . glipiZIDE (GLUCOTROL XL) 10 MG 24 hr tablet Take 10 mg by mouth in the morning and at bedtime.   Marland Kitchen glucagon (GLUCAGON EMERGENCY) 1 MG injection Inject 1 mg into the muscle as needed (When glucose drop low levels).  . loratadine (CLARITIN) 10 MG tablet Take 10 mg by mouth daily.  . montelukast (SINGULAIR) 10 MG tablet One a bedtime (Patient taking differently: Take 10 mg by mouth at bedtime.)  . Multiple Vitamins-Minerals (ZINC PO) Take 1 tablet by mouth daily. Unknown strength  . omeprazole (PRILOSEC) 20 MG capsule Take 20 mg by mouth daily.   Marland Kitchen oxybutynin (DITROPAN-XL) 10 MG 24 hr tablet Take 10 mg by mouth daily.  . potassium chloride (KLOR-CON) 10 MEQ tablet Take 1 tablet (10 mEq total) by mouth daily. (Patient taking differently: Take 10 mEq by mouth every other day.)  . sertraline (ZOLOFT) 50 MG tablet Take 50 mg by mouth daily.     Allergies:   Ativan [lorazepam], Pneumococcal vaccines, Cleocin [clindamycin hcl], Morphine and related, Other, and Buprenorphine  hcl   Social History   Socioeconomic History  . Marital status: Married    Spouse name: Not on file  . Number of children: Not on file  . Years of education: Not on file  . Highest education level: Not on file  Occupational History  . Not on file  Tobacco Use  . Smoking status: Never Smoker  . Smokeless tobacco: Never Used  . Tobacco comment: Mother & Father smoked  Vaping Use  . Vaping Use: Never used  Substance and Sexual Activity  . Alcohol use: No    Alcohol/week: 0.0 standard drinks  . Drug use: No  . Sexual activity: Yes  Other Topics Concern  . Not on file  Social History Narrative   [facility] (11/15/16):   Patient's originally from [place]. Has always lived in [place]. Currently has an outside dog. Remote exposure to [place] when her children were young. No known mold exposure. 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    Social Determinants of Health   Financial Resource Strain: Not on file  Food Insecurity: Not on file  Transportation Needs: Not on file  Physical Activity: Not on file  Stress: Not on file  Social Connections: Not on file     Family History: The patient's family history includes Breast cancer in her mother; COPD in her mother; Environmental Allergies in her son and another family member; Heart disease in her mother; Kidney cancer in her father; Liver cancer in her brother; Lupus in an other family member. ROS:   Please see the history of present illness.    All 14 point review of systems negative except as described per history of present illness  EKGs/Labs/Other Studies Reviewed:      Recent Labs: 08/03/2020: ALT 24; Hemoglobin 12.0; Platelet Count 208 09/01/2020: Pro B Natriuretic peptide (BNP) 1,085.0 09/08/2020: BUN 41; Creatinine, Ser 1.46; Potassium 4.0; Sodium 141  Recent Lipid Panel    Component Value Date/Time   TRIG 155 (H) 05/08/2013 2023    Physical Exam:    VS:   BP 102/64 (BP Location: Right Arm, Patient Position:  Sitting)   Pulse 80   Ht 4\' 11"  (1.499 m)   Wt 151 lb (68.5 kg)   SpO2 96%   BMI 30.50 kg/m     Wt Readings from Last 3 Encounters:  09/23/20 151 lb (68.5 kg)  09/03/20 167 lb (75.8 kg)  09/01/20 163 lb 3.2 oz (74 kg)     GEN:  Well nourished, well developed in no acute distress HEENT: Normal NECK: No JVD; No carotid bruits LYMPHATICS: No lymphadenopathy CARDIAC: RRR, no murmurs, no rubs, no gallops RESPIRATORY:  Clear to auscultation without rales, wheezing or rhonchi  ABDOMEN: Soft, non-tender, non-distended MUSCULOSKELETAL:  No edema; No deformity  SKIN: Warm and dry LOWER EXTREMITIES: no swelling NEUROLOGIC:  Alert and oriented x 3 PSYCHIATRIC:  Normal affect   ASSESSMENT:    1. Medication management   2. PAF (paroxysmal atrial fibrillation) ([place])   3. Dilated cardiomyopathy ([place])   4. Chronic systolic congestive heart failure ([place])   5. Primary hypertension    PLAN:    In order of problems listed above:  1. Paroxysmal atrial fibrillation, EKG today showed normal sinus rhythm.  We will continue flecainide, will continue anticoagulation. 2. History of cardiomyopathy with normalization of left ventricle ejection fraction but she does have mild to moderate mitral regurgitation.  Last time I did see her she was in diastolic congestive heart failure compensated.  I initiated small dose of diuretic she got quite impressive response.  She lost more than 10 pounds, however I am afraid now we dealing with opposite problem.  She is getting dizzy when she is getting up as well as she is weak and tired.  I asked her to take a break from taking any diuretics for 3 days, she was taking 20 mg daily lately, then I told her to check her weight every single day in the morning if her weight is up by at least 2 pounds in 2 consecutive days then she need to take half dose of diuretic which is 20 mg daily.  Also today I will check her  Chem-7. 3. Essential hypertension, blood pressure well controlled continue present management. 4. She described to have some palpitations few days ago however she was not able to grab it on the EKG.  I suspect she may have breakthrough atrial fibrillation   Medication Adjustments/Labs and Tests Ordered: Current medicines are reviewed at length with the patient today.  Concerns regarding medicines are outlined above.  Orders Placed This Encounter  Procedures  . Basic metabolic panel  . EKG 12-Lead   Medication changes: No orders of the defined types were placed in this encounter.   Signed, Park Liter, MD, [facility] 09/23/2020 3:25 PM    [place]

## 2020-09-24 LAB — BASIC METABOLIC PANEL
BUN/Creatinine Ratio: 25 (ref 12–28)
BUN: 33 mg/dL — ABNORMAL HIGH (ref 8–27)
CO2: 15 mmol/L — ABNORMAL LOW (ref 20–29)
Calcium: 9.2 mg/dL (ref 8.7–10.3)
Chloride: 101 mmol/L (ref 96–106)
Creatinine, Ser: 1.32 mg/dL — ABNORMAL HIGH (ref 0.57–1.00)
Glucose: 365 mg/dL — ABNORMAL HIGH (ref 65–99)
Potassium: 4.6 mmol/L (ref 3.5–5.2)
Sodium: 136 mmol/L (ref 134–144)
eGFR: 45 mL/min/{1.73_m2} — ABNORMAL LOW (ref >59)

## 2020-09-29 ENCOUNTER — Encounter: Payer: Self-pay | Admitting: Cardiology

## 2020-09-29 ENCOUNTER — Ambulatory Visit (INDEPENDENT_AMBULATORY_CARE_PROVIDER_SITE_OTHER): Payer: Medicare Other | Admitting: Cardiology

## 2020-09-29 ENCOUNTER — Other Ambulatory Visit: Payer: Self-pay | Admitting: Cardiology

## 2020-09-29 ENCOUNTER — Other Ambulatory Visit: Payer: Self-pay

## 2020-09-29 VITALS — BP 120/64 | HR 83 | Ht 59.0 in | Wt 152.0 lb

## 2020-09-29 DIAGNOSIS — I480 Paroxysmal atrial fibrillation: Secondary | ICD-10-CM | POA: Diagnosis not present

## 2020-09-29 DIAGNOSIS — I4819 Other persistent atrial fibrillation: Secondary | ICD-10-CM | POA: Diagnosis not present

## 2020-09-29 DIAGNOSIS — Z01812 Encounter for preprocedural laboratory examination: Secondary | ICD-10-CM

## 2020-09-29 MED ORDER — METOPROLOL TARTRATE 100 MG PO TABS: ORAL_TABLET | ORAL | 0 refills | Status: DC

## 2020-09-29 NOTE — Telephone Encounter (Signed)
Refill sent to pharmacy.   

## 2020-09-29 NOTE — Patient Instructions (Signed)
Medication Instructions:  Your physician recommends that you continue on your current medications as directed. Please refer to the Current Medication list given to you today.  *If you need a refill on your cardiac medications before your next appointment, please call your pharmacy*   Lab Work: Pre procedure labs on 5/2 in [place] office.  See procedure instructions below for further details  If you have labs (blood work) drawn today and your tests are completely normal, you will receive your results only by: Marland Kitchen MyChart Message (if you have MyChart) OR . A paper copy in the mail If you have any lab test that is abnormal or we need to change your treatment, we will call you to review the results.   Testing/Procedures: Your physician has requested that you have cardiac CT within 7 days PRIOR to your ablation. Cardiac computed tomography (CT) is a painless test that uses an x-ray machine to take clear, detailed pictures of your heart.  Please follow instruction sheet below located under "other instructions".    Your physician has recommended that you have an ablation. Catheter ablation is a medical procedure used to treat some cardiac arrhythmias (irregular heartbeats). During catheter ablation, a long, thin, flexible tube is put into a blood vessel in your groin (upper thigh), or neck. This tube is called an ablation catheter. It is then guided to your heart through the blood vessel. Radio frequency waves destroy small areas of heart tissue where abnormal heartbeats may cause an arrhythmia to start.  Please follow instruction sheet below located under "other instructions".    Follow-Up: At [facility], you and your health needs are our priority.  As part of our continuing mission to provide you with exceptional heart care, we have created designated Provider Care Teams.  These Care Teams include your primary Cardiologist (physician) and Advanced Practice Providers (APPs -  Physician Assistants  and Nurse Practitioners) who all work together to provide you with the care you need, when you need it.  Your next appointment:   4 week(s) after your ablation  The format for your next appointment:   In Person  Provider:   You will follow up in the [facility] located at [facility] Your provider will be: Roderic Palau, NP or Clint R. Fenton, PA-C    Thank you for choosing [facility]!!   Trinidad Curet, RN [phone]   Other Instructions  CT INSTRUCTIONS Your cardiac CT will be scheduled at:  [facility] [address] [place], [place] [address] [phone]  Please arrive at the [facility] main entrance of [facility] 30 minutes prior to test start time. Proceed to the [facility] Radiology Department (first floor) to check-in and test prep.  Please follow these instructions carefully (unless otherwise directed):  On the Night Before the Test: . Be sure to Drink plenty of water. . Do not consume any caffeinated/decaffeinated beverages or chocolate 12 hours prior to your test. . Do not take any antihistamines 12 hours prior to your test.  On the Day of the Test: . Drink plenty of water. Do not drink any water within one hour of the test. . Do not eat any food 4 hours prior to the test. . You may take your regular medications prior to the test.  . Take metoprolol (Lopressor) 100 mg two hours prior to test.  (hold your Carvedilol the morning of this testing) . HOLD Furosemide/Hydrochlorothiazide morning of the test. . FEMALES- please wear  underwire-free bra if available      After the Test: . Drink plenty of water. . After receiving IV contrast, you may experience a mild flushed feeling. This is normal. . On occasion, you may experience a mild rash up to 24 hours after the test. This is not dangerous. If this occurs, you can take Benadryl 25 mg and increase your fluid intake. . If you experience trouble breathing,  this can be serious. If it is severe call 911 IMMEDIATELY. If it is mild, please call our office. . If you take any of these medications: Glipizide/Metformin, Avandament, Glucavance, please do not take 48 hours after completing test unless otherwise instructed.   Once we have confirmed authorization from your insurance company, we will call you to set up a date and time for your test. Based on how quickly your insurance processes prior authorizations requests, please allow up to 4 weeks to be contacted for scheduling your Cardiac CT appointment. Be advised that routine Cardiac CT appointments could be scheduled as many as 8 weeks after your provider has ordered it.  For non-scheduling related questions, please contact the cardiac imaging nurse navigator should you have any questions/concerns: Marchia Bond, Cardiac Imaging Nurse Navigator Burley Saver, Interim Cardiac Imaging Nurse [place] and Vascular Services Direct Office Dial: [phone]   For scheduling needs, including cancellations and rescheduling, please call [place], [phone].     Electrophysiology/Ablation Procedure Instructions   You are scheduled for a(n)  ablation on 11/25/2020 with Dr. Allegra Lai.   1.   Pre procedure testing-             A.  LAB WORK --- On 11/02/2020  for your pre procedure blood work.  You do NOT need to be fasting. You can stop by the [place] office anytime during business hours (avoid lunch time hours).               B. COVID TEST-- On 11/23/2020 @ 1:00 pm - This is a Drive Up Visit at [address] [place], [place] [address].  Someone will direct you to the appropriate testing line. Stay in your car and someone will be with you shortly.   After you are tested please go home and self quarantine until the day of your procedure.     PROCEDURE DAY: 2. On the day of your procedure 11/25/2020 you will go to [facility] [phone]) at 6:30 am.  Dennis Bast will go to the main  entrance A [employer]) and enter where the [employer] are.  Your driver will drop you off and you will head down the hallway to ADMITTING.  You may have one support person come in to the hospital with you.  They will be asked to wait in the waiting room.  It is OK to have someone drop you off and come back when you are ready to be discharged.   3.   Do not eat or drink after midnight prior to your procedure.   4.   Do not miss any doses of your blood thinner prior to the morning of your procedure or your procedure will need to be rescheduled.       Do NOT take any medications the morning of your procedure.   5.  Plan for an overnight stay, but you may be discharged home after your procedure. If you use your phone frequently bring your phone charger, in case you have to stay.  If  you are discharged after your procedure you will need someone to drive you home and be with your for 24 hours after your procedure.   6. You will follow up with the AFIB clinic 4 weeks after your procedure.  You will follow up with Dr. Curt Bears  3 months after your procedure.  These appointments will be made for you.   * If you have ANY questions please call the office [phone] and ask for Kenneisha Cochrane RN or send me a MyChart message   * Occasionally, EP Studies and ablations can become lengthy.  Please make your family aware of this before your procedure starts.  Average time ranges from 2-8 hours for EP studies/ablations.  Your physician will call your family after the procedure with the results.                                     AFIB CLINIC INFORMATION: Your appointment is scheduled on: __________ at ____________. Please arrive 15 minutes early for check-in. The AFib Clinic is located in the Heart and Vascular Specialty Clinics at [facility]. Parking instructions/directions: [address] (off [address]). When you pull in to Entrance C, there is an underground parking garage to your right. The  code to enter the garage is _____________. Take the elevators to the first floor. Follow the signs to the Heart and Vascular Specialty Clinics. You will see registration at the end of the hallway.  Phone number: [phone]      Cardiac Ablation Cardiac ablation is a procedure to destroy (ablate) some heart tissue that is sending bad signals. These bad signals cause problems in heart rhythm. The heart has many areas that make these signals. If there are problems in these areas, they can make the heart beat in a way that is not normal. Destroying some tissues can help make the heart rhythm normal. Tell your doctor about:  Any allergies you have.  All medicines you are taking. These include vitamins, herbs, eye drops, creams, and over-the-counter medicines.  Any problems you or family members have had with medicines that make you fall asleep (anesthetics).  Any blood disorders you have.  Any surgeries you have had.  Any medical conditions you have, such as kidney failure.  Whether you are pregnant or may be pregnant. What are the risks? This is a safe procedure. But problems may occur, including:  Infection.  Bruising and bleeding.  Bleeding into the chest.  Stroke or blood clots.  Damage to nearby areas of your body.  Allergies to medicines or dyes.  The need for a pacemaker if the normal system is damaged.  Failure of the procedure to treat the problem. What happens before the procedure? Medicines Ask your doctor about:  Changing or stopping your normal medicines. This is important.  Taking aspirin and ibuprofen. Do not take these medicines unless your doctor tells you to take them.  Taking other medicines, vitamins, herbs, and supplements. General instructions  Follow instructions from your doctor about what you cannot eat or drink.  Plan to have someone take you home from the hospital or clinic.  If you will be going home right after the procedure, plan to  have someone with you for 24 hours.  Ask your doctor what steps will be taken to prevent infection. What happens during the procedure?  An IV tube will be put into one of your veins.  You will be given a medicine to help you relax.  The skin on your neck or groin will be numbed.  A cut (incision) will be made in your neck or groin. A needle will be put through your cut and into a large vein.  A tube (catheter) will be put into the needle. The tube will be moved to your heart.  Dye may be put through the tube. This helps your doctor see your heart.  Small devices (electrodes) on the tube will send out signals.  A type of energy will be used to destroy some heart tissue.  The tube will be taken out.  Pressure will be held on your cut. This helps stop bleeding.  A bandage will be put over your cut. The exact procedure may vary among doctors and hospitals.   What happens after the procedure?  You will be watched until you leave the hospital or clinic. This includes checking your heart rate, breathing rate, oxygen, and blood pressure.  Your cut will be watched for bleeding. You will need to lie still for a few hours.  Do not drive for 24 hours or as long as your doctor tells you. Summary  Cardiac ablation is a procedure to destroy some heart tissue. This is done to treat heart rhythm problems.  Tell your doctor about any medical conditions you may have. Tell him or her about all medicines you are taking to treat them.  This is a safe procedure. But problems may occur. These include infection, bruising, bleeding, and damage to nearby areas of your body.  Follow what your doctor tells you about food and drink. You may also be told to change or stop some of your medicines.  After the procedure, do not drive for 24 hours or as long as your doctor tells you. This information is not intended to replace advice given to you by your health care provider. Make sure you discuss any  questions you have with your health care provider. Document Revised: 05/23/2019 Document Reviewed: 05/23/2019 Elsevier Patient Education  2021 [employer].

## 2020-09-29 NOTE — Telephone Encounter (Signed)
62f, 68.5kg, scr 1.32 09/23/20, lovw/krasowski 09/23/20

## 2020-09-29 NOTE — Progress Notes (Signed)
Electrophysiology Office Note   Date:  09/29/2020   ID:  Alexandra, [name] 09/21/1955, MRN [id number]  PCP:  [name]., MD  Cardiologist:  [name] Primary Electrophysiologist:  Alexandra Chait [name], MD    Chief Complaint: AF   History of Present Illness: [name] is a 65 y.o. female who is being seen today for the evaluation of AF at the request of Park Liter, MD. Presenting today for electrophysiology evaluation.  She has a history significant for atrial fibrillation, cardiomyopathy with a normalized ejection fraction, diastolic dysfunction, mitral prolapse with mild to moderate mitral regurgitation, diabetes, hypertension, hyperlipidemia, breast cancer.  Today, she denies symptoms of palpitations, chest pain, shortness of breath, orthopnea, PND, lower extremity edema, claudication, dizziness, presyncope, syncope, bleeding, or neurologic sequela. The patient is tolerating medications without difficulties.  Over the past few months, she has had more frequent palpitations.  She states that she has checked her rhythm on her cardia and has intermittently noticed episodes of atrial fibrillation.  At this point, she would like to be off of medications.   Past Medical History:  Diagnosis Date  . Acquired thrombophilia ([place]) 01/29/2020  . AKI (acute kidney injury) ([place]) 11/20/2019  . Allergy 09/08/2016  . AMS (altered mental status)   . Anemia in end-stage renal disease ([place]) 07/16/2019  . Anemia, normocytic normochromic 07/27/2018  . Ankle joint pain 04/09/2018  . Arthritis    "back, knees, arms, wrists" (05/15/2013)  . Asthma   . Asymmetrical left sensorineural hearing loss 07/31/2019  . Atrial fibrillation ([place]) 11/15/2016  . Breast cancer ([place])   . C. difficile colitis   . Calculus of kidney 08/12/2012  . CHF (congestive heart failure) ([place])    "mild" (05/15/2013)  . CHF (congestive heart failure) ([place])    "mild" (05/15/2013)  . Chronic bronchitis ([place])   .  Chronic lower back pain   . Chronic non-seasonal allergic rhinitis 11/15/2016  . Chronic systolic congestive heart failure ([place])   . Chronic UTI   . Cough variant asthma vs uacs  07/24/2017   Onset in her 20s some better p rx with allergy shots in her 30s Singulair trial 11/15/16 >  Improved 01/18/17:  FEV1 1.29 L (61%)  Ratio 75  Still on ACEi as of 07/24/2017  Spirometry 06/28/18   FEV1 1.3 (61%)  Ratio 77 with min curvature   - Allergy profile 07/26/2018 >  Eos 1.2 /  IgE  420  RAST Pos mold only   . Diabetes mellitus type 2 in obese ([place])   . Diarrhea 11/15/2016  . Dilated cardiomyopathy ([place]) 07/16/2019  . DOE (dyspnea on exertion) 07/27/2018   Onset early 2019 assoc with uacs while on ACEi - 06/28/18    Walked RA x one lap =  approx 250 ft - stopped due to sob with sats still high 90's     . Dysphagia   . Dysrhythmia    atrial fib/dr [place] [facility]  . Family history of anesthesia complication    "my mother also had PONV" (05/15/2013)  . Fever 10/29/2017  . Foot pain 04/09/2018  . GERD (gastroesophageal reflux disease)   . Heart murmur   . High cholesterol   . History of breast cancer in female 07/19/2013  . HTN (hypertension) 05/08/2013  . Hydronephrosis of right kidney   . Hyperlipidemia 11/15/2016  . Hyponatremia 10/29/2017  . IDA (iron deficiency anemia) 04/19/2017  . Infection   . Kidney stones   . Left  breast mass 07/19/2013  . Low back pain 05/01/2018  . Migraine    "haven't had one in the early 2000's" (05/15/2013)  . Mitral regurgitation 09/03/2020  . Mixed incontinence 07/24/2018  . Nephrolithiasis 11/15/2016  . Other chronic cystitis 05/19/2011  . PAF (paroxysmal atrial fibrillation) ([place])   . Personal history of chemotherapy   . Personal history of radiation therapy   . Pneumonia    "used to be chronic; last time I had it was 2013" (05/15/2013)  . PONV (postoperative nausea and vomiting)   . Recurrent sinusitis 09/08/2016  . Recurrent UTI 11/20/2014  . Recurrent  UTI (urinary tract infection)    "from the continuous kidney stones; take Macrodantin qd" (05/15/2013)  . Respiratory failure requiring intubation ([place]) 05/08/2013  . Restrictive lung disease 01/18/2017  . Sacroiliac joint pain 05/16/2018  . Sepsis ([place]) 05   kidney stone infection  . Septic shock ([place]) 09/23/2018  . Simple chronic bronchitis ([place]) 11/15/2016  . Strep throat 10/29/2017  . Tachypnea   . Tension headache   . Type II diabetes mellitus ([place])   . Type II or unspecified type diabetes mellitus without mention of complication, not stated as uncontrolled 05/11/2013  . Urinary tract stones 05/19/2011  . Urinary urgency 07/24/2018   Past Surgical History:  Procedure Laterality Date  . 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 BREAST BIOPSY Left 02/2001  . BREAST LUMPECTOMY Left 02/2001  . BREAST LUMPECTOMY Left 09/23/2013   Procedure: LEFT LUMPECTOMY WITH SPECIMEN MAMMOGRAM;  Surgeon: Adin Hector, MD;  Location: [facility];  Service: General;  Laterality: Left;  . COLONOSCOPY    . DIAGNOSTIC LAPAROSCOPY     cyst-near ovary  . LITHOTRIPSY     "2-3 times prior to 2002" (05/15/2013)  . SPINAL FUSION N/A 05/08/2013   Procedure: T9-S1 INSTRUMENTED FUSION T12 -S1 DECOMPRESSION;  Surgeon: Melina Schools, MD;  Location: [facility];  Service: Orthopedics;  Laterality: N/A;  . TUBAL LIGATION Bilateral 1985     Current Outpatient Medications  Medication Sig Dispense Refill  . acetaminophen (TYLENOL) 500 MG tablet Take 500 mg by mouth every 6 (six) hours as needed for mild pain or headache.     . Ascorbic Acid (VITAMIN C) 1000 MG tablet Take 1,000 mg by mouth daily.    Marland Kitchen atorvastatin (LIPITOR) 10 MG tablet Take 10 mg by mouth daily.    . carvedilol (COREG) 6.25 MG tablet TAKE 1 TABLET (6.25 MG TOTAL) BY MOUTH IN THE MORNING, AT NOON, AND AT BEDTIME. 90 tablet 1  . ELIQUIS 5 MG TABS tablet TAKE  1 TABLET BY MOUTH TWICE A DAY 180 tablet 1  . flecainide (TAMBOCOR) 50 MG tablet TAKE 1 TABLET BY MOUTH TWICE A DAY (Patient taking differently: Take 50 mg by mouth 2 (two) times daily.) 180 tablet 3  . fluticasone (FLONASE) 50 MCG/ACT nasal spray PLACE 1 SPRAY INTO BOTH NOSTRILS 2 (TWO) TIMES DAILY. (Patient taking differently: Place 1 spray into both nostrils as needed for allergies.) 16 mL 11  . furosemide (LASIX) 40 MG tablet Take 1 tablet (40 mg total) by mouth daily. (Patient taking differently: Take 20 mg by mouth daily.) 90 tablet 1  . glipiZIDE (GLUCOTROL XL) 10 MG 24 hr tablet Take 10 mg by mouth in the morning and at bedtime.     Marland Kitchen glucagon (GLUCAGON EMERGENCY) 1 MG injection Inject 1 mg into the  muscle as needed (When glucose drop low levels).    . loratadine (CLARITIN) 10 MG tablet Take 10 mg by mouth daily.    . metoprolol tartrate (LOPRESSOR) 100 MG tablet Take 1 tablet (100 mg total) two hours prior to CT testing 1 tablet 0  . montelukast (SINGULAIR) 10 MG tablet One a bedtime (Patient taking differently: Take 10 mg by mouth at bedtime.) 90 tablet 3  . Multiple Vitamins-Minerals (ZINC PO) Take 1 tablet by mouth daily. Unknown strength    . omeprazole (PRILOSEC) 20 MG capsule Take 20 mg by mouth daily.     Marland Kitchen oxybutynin (DITROPAN-XL) 10 MG 24 hr tablet Take 10 mg by mouth daily.    . potassium chloride (KLOR-CON) 10 MEQ tablet Take 1 tablet (10 mEq total) by mouth daily. (Patient taking differently: Take 10 mEq by mouth every other day.) 90 tablet 1  . sertraline (ZOLOFT) 50 MG tablet Take 50 mg by mouth daily.     No current facility-administered medications for this visit.    Allergies:   Ativan [lorazepam], Pneumococcal vaccines, Cleocin [clindamycin hcl], Morphine and related, Other, and Buprenorphine hcl   Social History:  The patient  reports that she has never smoked. She has never used smokeless tobacco. She reports that she does not drink alcohol and does not use drugs.    Family History:  The patient's family history includes Breast cancer in her mother; COPD in her mother; Environmental Allergies in her son and another family member; Heart disease in her mother; Kidney cancer in her father; Liver cancer in her brother; Lupus in an other family member.    ROS:  Please see the history of present illness.   Otherwise, review of systems is positive for none.   All other systems are reviewed and negative.    PHYSICAL EXAM: VS:  BP 120/64   Pulse 83   Ht 4\' 11"  (1.499 m)   Wt 152 lb (68.9 kg)   BMI 30.70 kg/m  , BMI Body mass index is 30.7 kg/m. GEN: Well nourished, well developed, in no acute distress  HEENT: normal  Neck: no JVD, carotid bruits, or masses Cardiac: RRR; no murmurs, rubs, or gallops,no edema  Respiratory:  clear to auscultation bilaterally, normal work of breathing GI: soft, nontender, nondistended, + BS MS: no deformity or atrophy  Skin: warm and dry Neuro:  Strength and sensation are intact Psych: euthymic mood, full affect  EKG:  EKG is ordered today. Personal review of the ekg ordered shows sinus rhythm  Recent Labs: 08/03/2020: ALT 24; Hemoglobin 12.0; Platelet Count 208 09/01/2020: Pro B Natriuretic peptide (BNP) 1,085.0 09/23/2020: BUN 33; Creatinine, Ser 1.32; Potassium 4.6; Sodium 136    Lipid Panel     Component Value Date/Time   TRIG 155 (H) 05/08/2013 2023     Wt Readings from Last 3 Encounters:  09/29/20 152 lb (68.9 kg)  09/23/20 151 lb (68.5 kg)  09/03/20 167 lb (75.8 kg)      Other studies Reviewed: Additional studies/ records that were reviewed today include: TTE 01/13/20  Review of the above records today demonstrates:  1. Left ventricular ejection fraction, by estimation, is 60 to 65%. The  left ventricle has normal function. The left ventricle has no regional  wall motion abnormalities. There is severe left ventricular hypertrophy of  the septal segment. Left  ventricular diastolic parameters are  consistent with Grade II diastolic  dysfunction (pseudonormalization).  2. Right ventricular systolic function is normal. The right ventricular  size is normal. There is normal pulmonary artery systolic pressure.  3. There is mild to moderate mitral annular calcification. Mild anterior  leaflet prolapse. Mild to moderate posterior directed mitral valve  regurgitation. No evidence of mitral stenosis.  4. Tricuspid valve regurgitation is moderate.  5. Suspect a unicuspid valve. Aortic valve regurgitation is not  visualized. No aortic stenosis is present.  6. The inferior vena cava is normal in size with greater than 50%  respiratory variability, suggesting right atrial pressure of 3 mmHg.    ASSESSMENT AND PLAN:  1.  Paroxysmal atrial fibrillation: Currently on carvedilol, flecainide, Eliquis.CHA2DS2-VASc of at least 4.  She is intermittently had episodes of atrial fibrillation, not controlled on flecainide.  Due to that, she would prefer ablation of her medication adjustments.  Risks and benefits of been discussed which tamponade, heart block, stroke, damage to chest organs.  She understands these risks and is agreed to the procedure.  2.  Dilated cardiomyopathy: Fortunately ejection fraction is improved back to normal.  She continues to have intermittent episodes of volume overload.  Plan per primary cardiology.  3.  Hypertension: Currently well controlled  Case discussed with primary cardiology  Current medicines are reviewed at length with the patient today.   The patient does not have concerns regarding her medicines.  The following changes were made today:  none  Labs/ tests ordered today include:  Orders Placed This Encounter  Procedures  . CT CARDIAC MORPH/PULM VEIN W/CM&W/O CA SCORE  . Basic metabolic panel  . CBC  . EKG 12-Lead     Disposition:   FU with [employer] Kindt 3 months  Signed, Kellyn Mansfield [name], MD  09/29/2020 9:58 AM     [facility] [address] [place] [address] [phone] (office) [phone] (fax)'

## 2020-10-06 ENCOUNTER — Other Ambulatory Visit: Payer: Self-pay

## 2020-10-06 ENCOUNTER — Ambulatory Visit (INDEPENDENT_AMBULATORY_CARE_PROVIDER_SITE_OTHER): Payer: Medicare Other

## 2020-10-06 ENCOUNTER — Ambulatory Visit: Payer: Federal, State, Local not specified - PPO | Admitting: Cardiology

## 2020-10-06 DIAGNOSIS — I480 Paroxysmal atrial fibrillation: Secondary | ICD-10-CM

## 2020-10-06 DIAGNOSIS — I5033 Acute on chronic diastolic (congestive) heart failure: Secondary | ICD-10-CM | POA: Diagnosis not present

## 2020-10-06 LAB — ECHOCARDIOGRAM COMPLETE
Calc EF: 34.2 %
MV M vel: 4.68 m/s
MV Peak grad: 87.6 mmHg
Radius: 0.9 cm
S' Lateral: 3.7 cm
Single Plane A2C EF: 37.7 %
Single Plane A4C EF: 32.1 %

## 2020-10-06 NOTE — Progress Notes (Signed)
Complete echocardiogram performed.  Jimmy Tanita Palinkas RDCS, RVT  

## 2020-10-09 ENCOUNTER — Telehealth: Payer: Self-pay

## 2020-10-09 NOTE — Telephone Encounter (Signed)
Pt called in to to r/s an appt   [name]

## 2020-10-19 ENCOUNTER — Other Ambulatory Visit: Payer: Self-pay

## 2020-10-19 ENCOUNTER — Inpatient Hospital Stay (HOSPITAL_BASED_OUTPATIENT_CLINIC_OR_DEPARTMENT_OTHER): Payer: Medicare Other | Admitting: Family

## 2020-10-19 ENCOUNTER — Encounter: Payer: Self-pay | Admitting: Family

## 2020-10-19 ENCOUNTER — Institutional Professional Consult (permissible substitution): Payer: Federal, State, Local not specified - PPO | Admitting: Cardiology

## 2020-10-19 ENCOUNTER — Inpatient Hospital Stay: Payer: Medicare Other

## 2020-10-19 ENCOUNTER — Inpatient Hospital Stay: Payer: Medicare Other | Attending: Family

## 2020-10-19 VITALS — BP 132/63 | HR 88 | Temp 98.4°F | Resp 18 | Ht 59.0 in | Wt 156.8 lb

## 2020-10-19 DIAGNOSIS — I4891 Unspecified atrial fibrillation: Secondary | ICD-10-CM | POA: Insufficient documentation

## 2020-10-19 DIAGNOSIS — D509 Iron deficiency anemia, unspecified: Secondary | ICD-10-CM | POA: Diagnosis not present

## 2020-10-19 DIAGNOSIS — N6332 Unspecified lump in axillary tail of the left breast: Secondary | ICD-10-CM | POA: Insufficient documentation

## 2020-10-19 DIAGNOSIS — Z853 Personal history of malignant neoplasm of breast: Secondary | ICD-10-CM | POA: Diagnosis not present

## 2020-10-19 DIAGNOSIS — N644 Mastodynia: Secondary | ICD-10-CM | POA: Diagnosis not present

## 2020-10-19 DIAGNOSIS — M7989 Other specified soft tissue disorders: Secondary | ICD-10-CM

## 2020-10-19 DIAGNOSIS — C50011 Malignant neoplasm of nipple and areola, right female breast: Secondary | ICD-10-CM

## 2020-10-19 DIAGNOSIS — N183 Chronic kidney disease, stage 3 unspecified: Secondary | ICD-10-CM

## 2020-10-19 DIAGNOSIS — K909 Intestinal malabsorption, unspecified: Secondary | ICD-10-CM | POA: Insufficient documentation

## 2020-10-19 DIAGNOSIS — D500 Iron deficiency anemia secondary to blood loss (chronic): Secondary | ICD-10-CM | POA: Diagnosis not present

## 2020-10-19 DIAGNOSIS — G629 Polyneuropathy, unspecified: Secondary | ICD-10-CM | POA: Diagnosis not present

## 2020-10-19 DIAGNOSIS — D508 Other iron deficiency anemias: Secondary | ICD-10-CM

## 2020-10-19 DIAGNOSIS — D631 Anemia in chronic kidney disease: Secondary | ICD-10-CM

## 2020-10-19 LAB — CMP (CANCER CENTER ONLY)
ALT: 21 U/L (ref 0–44)
AST: 20 U/L (ref 15–41)
Albumin: 4.1 g/dL (ref 3.5–5.0)
Alkaline Phosphatase: 105 U/L (ref 38–126)
Anion gap: 11 (ref 5–15)
BUN: 32 mg/dL — ABNORMAL HIGH (ref 8–23)
CO2: 22 mmol/L (ref 22–32)
Calcium: 9 mg/dL (ref 8.9–10.3)
Chloride: 102 mmol/L (ref 98–111)
Creatinine: 1.21 mg/dL — ABNORMAL HIGH (ref 0.44–1.00)
GFR, Estimated: 50 mL/min — ABNORMAL LOW (ref >60)
Glucose, Bld: 368 mg/dL — ABNORMAL HIGH (ref 70–99)
Potassium: 4.5 mmol/L (ref 3.5–5.1)
Sodium: 135 mmol/L (ref 135–145)
Total Bilirubin: 0.3 mg/dL (ref 0.3–1.2)
Total Protein: 7.5 g/dL (ref 6.5–8.1)

## 2020-10-19 LAB — CBC WITH DIFFERENTIAL (CANCER CENTER ONLY)
Abs Immature Granulocytes: 0.02 10*3/uL (ref 0.00–0.07)
Basophils Absolute: 0.1 10*3/uL (ref 0.0–0.1)
Basophils Relative: 1 %
Eosinophils Absolute: 0.2 10*3/uL (ref 0.0–0.5)
Eosinophils Relative: 2 %
HCT: 37.6 % (ref 36.0–46.0)
Hemoglobin: 12.3 g/dL (ref 12.0–15.0)
Immature Granulocytes: 0 %
Lymphocytes Relative: 22 %
Lymphs Abs: 1.7 10*3/uL (ref 0.7–4.0)
MCH: 30.1 pg (ref 26.0–34.0)
MCHC: 32.7 g/dL (ref 30.0–36.0)
MCV: 91.9 fL (ref 80.0–100.0)
Monocytes Absolute: 0.4 10*3/uL (ref 0.1–1.0)
Monocytes Relative: 5 %
Neutro Abs: 5.5 10*3/uL (ref 1.7–7.7)
Neutrophils Relative %: 70 %
Platelet Count: 224 10*3/uL (ref 150–400)
RBC: 4.09 MIL/uL (ref 3.87–5.11)
RDW: 14.4 % (ref 11.5–15.5)
WBC Count: 7.8 10*3/uL (ref 4.0–10.5)
nRBC: 0 % (ref 0.0–0.2)

## 2020-10-19 LAB — RETICULOCYTES
Immature Retic Fract: 14.6 % (ref 2.3–15.9)
RBC.: 4.01 MIL/uL (ref 3.87–5.11)
Retic Count, Absolute: 75.8 10*3/uL (ref 19.0–186.0)
Retic Ct Pct: 1.9 % (ref 0.4–3.1)

## 2020-10-19 NOTE — H&P (View-Only) (Signed)
Hematology and Oncology Follow Up Visit  [name] [id number] 1956/02/16 65 y.o. 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 She is having fullness and tenderness in the left axilla.  No adenopathy on exam. She does feel like she has some fluid build up in the left axilla.  Bilateral breast exam negative. No mas, lesion or rash noted.  She has episodes of dizziness and unsteadiness on her feet. No falls or syncope to report.  She states that she will likely have an ablation for atrial fib with Dr. Curt Bears in May.  She is having issues with CHF and significant mitral valve regurgitation. Her EF has decreased from 60-65% last summer to 40-45%.  She is currently on Lasix 20 mg PO daily.  She states that she follows up with her cardiologist Dr. Agustin Cree to discuss possible valve replacement on 4/25.  She has some SOB with over exertion.  No fever, chills, n/v, cough, rash, abdominal pain or changes in bowel or bladder habits.  No swelling in her extremities at this time. He legs get weak and she takes a break to rest as needed.  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  She has maintained a good appetite and is staying properly hydrated. Her weight is stable at 156 lbs.   ECOG Performance Status: 1 - Symptomatic but completely ambulatory  Medications:  Allergies as of 10/19/2020      Reactions   Pneumococcal Vaccines Other (See Comments)   Really high fever, and flu symptoms. MD instructed not to give   Cleocin [clindamycin Hcl] Other (See Comments)   "irritated my esophagus"   Morphine And Related Nausea And Vomiting   Other Nausea And Vomiting, Swelling   Shrimp if  eat a lot   Ativan [lorazepam] Other (See Comments)   Pt states she has taken before with no issue.   Buprenorphine Hcl Nausea And Vomiting   Pt does not know if she has ever had this medication      Medication List       Accurate as of October 19, 2020  2:36 PM. If you have any questions, ask your nurse or doctor.        acetaminophen 500 MG tablet Commonly known as: TYLENOL Take 500 mg by mouth every 6 (six) hours as needed for mild pain or headache.   atorvastatin 10 MG tablet Commonly known as: LIPITOR Take 10 mg by mouth daily.   carvedilol 6.25 MG tablet Commonly known as: COREG TAKE 1 TABLET (6.25 MG TOTAL) BY MOUTH IN THE MORNING, AT NOON, AND AT BEDTIME.   Eliquis 5 MG Tabs tablet Generic drug: apixaban TAKE 1 TABLET BY MOUTH TWICE A DAY   flecainide 50 MG tablet Commonly known as: TAMBOCOR TAKE 1 TABLET BY MOUTH TWICE A DAY   fluticasone 50 MCG/ACT nasal spray Commonly known as: FLONASE PLACE 1 SPRAY INTO BOTH NOSTRILS 2 (TWO) TIMES DAILY. What changed:   when to take this  reasons to take this   furosemide 40 MG tablet Commonly known as: LASIX Take 1 tablet (40 mg total) by mouth daily. What changed: how much to take   glipiZIDE 10 MG 24 hr tablet Commonly known as:  GLUCOTROL XL Take 10 mg by mouth in the morning and at bedtime.   Glucagon Emergency 1 MG Kit Inject 1 mg into the muscle as needed (When glucose drop low levels).   loratadine 10 MG tablet Commonly known as: CLARITIN Take 10 mg by mouth daily.   metoprolol tartrate 100 MG tablet Commonly known as: LOPRESSOR Take 1 tablet (100 mg total) two hours prior to CT testing   montelukast 10 MG tablet Commonly known as: SINGULAIR One a bedtime What changed:   how much to take  how to take this  when to take this  additional instructions   omeprazole 20 MG capsule Commonly known as: PRILOSEC Take 20 mg by mouth daily.   oxybutynin 10 MG 24 hr tablet Commonly known as:  DITROPAN-XL Take 10 mg by mouth daily.   potassium chloride 10 MEQ tablet Commonly known as: KLOR-CON Take 1 tablet (10 mEq total) by mouth daily.   sertraline 50 MG tablet Commonly known as: ZOLOFT Take 50 mg by mouth daily.   vitamin C 1000 MG tablet Take 1,000 mg by mouth daily.   ZINC PO Take 1 tablet by mouth daily. Unknown strength       Allergies:  Allergies  Allergen Reactions  . Pneumococcal Vaccines Other (See Comments)    Really high fever, and flu symptoms. MD instructed not to give  . Cleocin [Clindamycin Hcl] Other (See Comments)    "irritated my esophagus"  . Morphine And Related Nausea And Vomiting  . Other Nausea And Vomiting and Swelling    Shrimp if eat a lot  . Ativan [Lorazepam] Other (See Comments)    Pt states she has taken before with no issue.  . Buprenorphine Hcl Nausea And Vomiting    Pt does not know if she has ever had this medication    Past Medical History, Surgical history, Social history, and Family History were reviewed and updated.  Review of Systems: All other 10 point review of systems is negative.   Physical Exam:  height is 4' 11" (1.499 m) and weight is 156 lb 12.8 oz (71.1 kg). Her oral temperature is 98.4 F (36.9 C). Her blood pressure is 132/63 and her pulse is 88. Her respiration is 18 and oxygen saturation is 100%.   Wt Readings from Last 3 Encounters:  10/19/20 156 lb 12.8 oz (71.1 kg)  09/29/20 152 lb (68.9 kg)  09/23/20 151 lb (68.5 kg)    Ocular: Sclerae unicteric, pupils equal, round and reactive to light Ear-nose-throat: Oropharynx clear, dentition fair Lymphatic: No cervical, supraclavicular or axillary adenopathy Lungs no rales or rhonchi, good excursion bilaterally Heart regular rate and rhythm, no murmur appreciated Abd soft, nontender, positive bowel sounds MSK no focal spinal tenderness, no joint edema Neuro: non-focal, well-oriented, appropriate affect Breasts: Bilateral breast exam negative. No  mass, lesion or rash noted.   Lab Results  Component Value Date   WBC 7.8 10/19/2020   HGB 12.3 10/19/2020   HCT 37.6 10/19/2020   MCV 91.9 10/19/2020   PLT 224 10/19/2020   Lab Results  Component Value Date   FERRITIN 394 (H) 08/03/2020   IRON 85 08/03/2020   TIBC 276 08/03/2020   UIBC 192 08/03/2020   IRONPCTSAT 31 08/03/2020   Lab Results  Component Value Date   RETICCTPCT 1.9 10/19/2020   RBC 4.09 10/19/2020   RBC 4.01 10/19/2020   No results found for: KPAFRELGTCHN, LAMBDASER, KAPLAMBRATIO Lab Results  Component Value Date   IGGSERUM 789 11/15/2016     IGA 150 11/15/2016   IGMSERUM 86 11/15/2016   No results found for: Odetta Pink, SPEI   Chemistry      Component Value Date/Time   NA 135 10/19/2020 1329   NA 136 09/23/2020 1530   NA 144 04/17/2017 1150   NA 141 04/18/2016 1256   K 4.5 10/19/2020 1329   K 4.6 04/17/2017 1150   K 4.4 04/18/2016 1256   CL 102 10/19/2020 1329   CL 109 (H) 04/17/2017 1150   CO2 22 10/19/2020 1329   CO2 23 04/17/2017 1150   CO2 18 (L) 04/18/2016 1256   BUN 32 (H) 10/19/2020 1329   BUN 33 (H) 09/23/2020 1530   BUN 28 (H) 04/17/2017 1150   BUN 27.3 (H) 04/18/2016 1256   CREATININE 1.21 (H) 10/19/2020 1329   CREATININE 1.3 (H) 04/17/2017 1150   CREATININE 1.4 (H) 04/18/2016 1256      Component Value Date/Time   CALCIUM 9.0 10/19/2020 1329   CALCIUM 9.6 04/17/2017 1150   CALCIUM 9.4 04/18/2016 1256   ALKPHOS 105 10/19/2020 1329   ALKPHOS 62 04/17/2017 1150   ALKPHOS 68 04/18/2016 1256   AST 20 10/19/2020 1329   AST 14 04/18/2016 1256   ALT 21 10/19/2020 1329   ALT 20 04/17/2017 1150   ALT 23 04/18/2016 1256   BILITOT 0.3 10/19/2020 1329   BILITOT <0.22 04/18/2016 1256       Impression and Plan: [name] is a very pleasant 65yo caucasian female with remote history of stage I ductal carcinoma of the left breastdiagnosed in 2003. She also has history of  multifactorial anemia. We will get a mammogram and left axillary [place] to better assess.  No ESA needed this visit, Hgb 12.3.  Iron studies are pending.  Follow-up in 8 weeks.  She can contact our office with any questions or concerns.   Laverna Peace, NP 4/18/20222:36 PM

## 2020-10-19 NOTE — Progress Notes (Signed)
Hematology and Oncology Follow Up Visit  [name] [id number] 1956/02/16 65 y.o. 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 She is having fullness and tenderness in the left axilla.  No adenopathy on exam. She does feel like she has some fluid build up in the left axilla.  Bilateral breast exam negative. No mas, lesion or rash noted.  She has episodes of dizziness and unsteadiness on her feet. No falls or syncope to report.  She states that she will likely have an ablation for atrial fib with Dr. Curt Bears in May.  She is having issues with CHF and significant mitral valve regurgitation. Her EF has decreased from 60-65% last summer to 40-45%.  She is currently on Lasix 20 mg PO daily.  She states that she follows up with her cardiologist Dr. Agustin Cree to discuss possible valve replacement on 4/25.  She has some SOB with over exertion.  No fever, chills, n/v, cough, rash, abdominal pain or changes in bowel or bladder habits.  No swelling in her extremities at this time. He legs get weak and she takes a break to rest as needed.  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  She has maintained a good appetite and is staying properly hydrated. Her weight is stable at 156 lbs.   ECOG Performance Status: 1 - Symptomatic but completely ambulatory  Medications:  Allergies as of 10/19/2020      Reactions   Pneumococcal Vaccines Other (See Comments)   Really high fever, and flu symptoms. MD instructed not to give   Cleocin [clindamycin Hcl] Other (See Comments)   "irritated my esophagus"   Morphine And Related Nausea And Vomiting   Other Nausea And Vomiting, Swelling   Shrimp if  eat a lot   Ativan [lorazepam] Other (See Comments)   Pt states she has taken before with no issue.   Buprenorphine Hcl Nausea And Vomiting   Pt does not know if she has ever had this medication      Medication List       Accurate as of October 19, 2020  2:36 PM. If you have any questions, ask your nurse or doctor.        acetaminophen 500 MG tablet Commonly known as: TYLENOL Take 500 mg by mouth every 6 (six) hours as needed for mild pain or headache.   atorvastatin 10 MG tablet Commonly known as: LIPITOR Take 10 mg by mouth daily.   carvedilol 6.25 MG tablet Commonly known as: COREG TAKE 1 TABLET (6.25 MG TOTAL) BY MOUTH IN THE MORNING, AT NOON, AND AT BEDTIME.   Eliquis 5 MG Tabs tablet Generic drug: apixaban TAKE 1 TABLET BY MOUTH TWICE A DAY   flecainide 50 MG tablet Commonly known as: TAMBOCOR TAKE 1 TABLET BY MOUTH TWICE A DAY   fluticasone 50 MCG/ACT nasal spray Commonly known as: FLONASE PLACE 1 SPRAY INTO BOTH NOSTRILS 2 (TWO) TIMES DAILY. What changed:   when to take this  reasons to take this   furosemide 40 MG tablet Commonly known as: LASIX Take 1 tablet (40 mg total) by mouth daily. What changed: how much to take   glipiZIDE 10 MG 24 hr tablet Commonly known as:  GLUCOTROL XL Take 10 mg by mouth in the morning and at bedtime.   Glucagon Emergency 1 MG Kit Inject 1 mg into the muscle as needed (When glucose drop low levels).   loratadine 10 MG tablet Commonly known as: CLARITIN Take 10 mg by mouth daily.   metoprolol tartrate 100 MG tablet Commonly known as: LOPRESSOR Take 1 tablet (100 mg total) two hours prior to CT testing   montelukast 10 MG tablet Commonly known as: SINGULAIR One a bedtime What changed:   how much to take  how to take this  when to take this  additional instructions   omeprazole 20 MG capsule Commonly known as: PRILOSEC Take 20 mg by mouth daily.   oxybutynin 10 MG 24 hr tablet Commonly known as:  DITROPAN-XL Take 10 mg by mouth daily.   potassium chloride 10 MEQ tablet Commonly known as: KLOR-CON Take 1 tablet (10 mEq total) by mouth daily.   sertraline 50 MG tablet Commonly known as: ZOLOFT Take 50 mg by mouth daily.   vitamin C 1000 MG tablet Take 1,000 mg by mouth daily.   ZINC PO Take 1 tablet by mouth daily. Unknown strength       Allergies:  Allergies  Allergen Reactions  . Pneumococcal Vaccines Other (See Comments)    Really high fever, and flu symptoms. MD instructed not to give  . Cleocin [Clindamycin Hcl] Other (See Comments)    "irritated my esophagus"  . Morphine And Related Nausea And Vomiting  . Other Nausea And Vomiting and Swelling    Shrimp if eat a lot  . Ativan [Lorazepam] Other (See Comments)    Pt states she has taken before with no issue.  . Buprenorphine Hcl Nausea And Vomiting    Pt does not know if she has ever had this medication    Past Medical History, Surgical history, Social history, and Family History were reviewed and updated.  Review of Systems: All other 10 point review of systems is negative.   Physical Exam:  height is _0  (1.499 m) and weight is 156 lb 12.8 oz (71.1 kg). Her oral temperature is 98.4 F (36.9 C). Her blood pressure is 132/63 and her pulse is 88. Her respiration is 18 and oxygen saturation is 100%.   Wt Readings from Last 3 Encounters:  10/19/20 156 lb 12.8 oz (71.1 kg)  09/29/20 152 lb (68.9 kg)  09/23/20 151 lb (68.5 kg)    Ocular: Sclerae unicteric, pupils equal, round and reactive to light Ear-nose-throat: Oropharynx clear, dentition fair Lymphatic: No cervical, supraclavicular or axillary adenopathy Lungs no rales or rhonchi, good excursion bilaterally Heart regular rate and rhythm, no murmur appreciated Abd soft, nontender, positive bowel sounds MSK no focal spinal tenderness, no joint edema Neuro: non-focal, well-oriented, appropriate affect Breasts: Bilateral breast exam negative. No  mass, lesion or rash noted.   Lab Results  Component Value Date   WBC 7.8 10/19/2020   HGB 12.3 10/19/2020   HCT 37.6 10/19/2020   MCV 91.9 10/19/2020   PLT 224 10/19/2020   Lab Results  Component Value Date   FERRITIN 394 (H) 08/03/2020   IRON 85 08/03/2020   TIBC 276 08/03/2020   UIBC 192 08/03/2020   IRONPCTSAT 31 08/03/2020   Lab Results  Component Value Date   RETICCTPCT 1.9 10/19/2020   RBC 4.09 10/19/2020   RBC 4.01 10/19/2020   No results found for: Nils Pyle [facility] Lab Results  Component Value Date   IGGSERUM 789 11/15/2016  IGA 150 11/15/2016   IGMSERUM 86 11/15/2016   No results found for: Odetta Pink, SPEI   Chemistry      Component Value Date/Time   NA 135 10/19/2020 1329   NA 136 09/23/2020 1530   NA 144 04/17/2017 1150   NA 141 04/18/2016 1256   K 4.5 10/19/2020 1329   K 4.6 04/17/2017 1150   K 4.4 04/18/2016 1256   CL 102 10/19/2020 1329   CL 109 (H) 04/17/2017 1150   CO2 22 10/19/2020 1329   CO2 23 04/17/2017 1150   CO2 18 (L) 04/18/2016 1256   BUN 32 (H) 10/19/2020 1329   BUN 33 (H) 09/23/2020 1530   BUN 28 (H) 04/17/2017 1150   BUN 27.3 (H) 04/18/2016 1256   CREATININE 1.21 (H) 10/19/2020 1329   CREATININE 1.3 (H) 04/17/2017 1150   CREATININE 1.4 (H) 04/18/2016 1256      Component Value Date/Time   CALCIUM 9.0 10/19/2020 1329   CALCIUM 9.6 04/17/2017 1150   CALCIUM 9.4 04/18/2016 1256   ALKPHOS 105 10/19/2020 1329   ALKPHOS 62 04/17/2017 1150   ALKPHOS 68 04/18/2016 1256   AST 20 10/19/2020 1329   AST 14 04/18/2016 1256   ALT 21 10/19/2020 1329   ALT 20 04/17/2017 1150   ALT 23 04/18/2016 1256   BILITOT 0.3 10/19/2020 1329   BILITOT <0.22 04/18/2016 1256       Impression and Plan: Ms. Vanzile is a very pleasant 65yo caucasian female with remote history of stage I ductal carcinoma of the left breastdiagnosed in 2003. She also has history of  multifactorial anemia. We will get a mammogram and left axillary [place] to better assess.  No ESA needed this visit, Hgb 12.3.  Iron studies are pending.  Follow-up in 8 weeks.  She can contact our office with any questions or concerns.   Laverna Peace, NP 4/18/20222:36 PM

## 2020-10-20 ENCOUNTER — Telehealth: Payer: Self-pay | Admitting: Family

## 2020-10-20 LAB — IRON AND TIBC
Iron: 89 ug/dL (ref 41–142)
Saturation Ratios: 35 % (ref 21–57)
TIBC: 251 ug/dL (ref 236–444)
UIBC: 162 ug/dL (ref 120–384)

## 2020-10-20 LAB — FERRITIN: Ferritin: 423 ng/mL — ABNORMAL HIGH (ref 11–307)

## 2020-10-20 NOTE — Telephone Encounter (Signed)
Appointments scheduled calendar printed & mailed per 4/18 los

## 2020-10-21 ENCOUNTER — Other Ambulatory Visit: Payer: Self-pay | Admitting: Cardiology

## 2020-10-21 NOTE — Telephone Encounter (Signed)
Refill sent to pharmacy.   

## 2020-10-23 DIAGNOSIS — Z923 Personal history of irradiation: Secondary | ICD-10-CM | POA: Insufficient documentation

## 2020-10-26 ENCOUNTER — Ambulatory Visit (INDEPENDENT_AMBULATORY_CARE_PROVIDER_SITE_OTHER): Payer: Medicare Other | Admitting: Cardiology

## 2020-10-26 ENCOUNTER — Encounter: Payer: Self-pay | Admitting: Cardiology

## 2020-10-26 ENCOUNTER — Other Ambulatory Visit: Payer: Self-pay

## 2020-10-26 VITALS — Ht 59.0 in | Wt 153.0 lb

## 2020-10-26 DIAGNOSIS — I420 Dilated cardiomyopathy: Secondary | ICD-10-CM | POA: Diagnosis not present

## 2020-10-26 DIAGNOSIS — I340 Nonrheumatic mitral (valve) insufficiency: Secondary | ICD-10-CM

## 2020-10-26 DIAGNOSIS — I5022 Chronic systolic (congestive) heart failure: Secondary | ICD-10-CM | POA: Diagnosis not present

## 2020-10-26 DIAGNOSIS — I480 Paroxysmal atrial fibrillation: Secondary | ICD-10-CM | POA: Diagnosis not present

## 2020-10-26 NOTE — Progress Notes (Signed)
Cardiology Office Note:    Date:  10/26/2020   ID:  [name], DOB 04/16/1956, MRN [id number]  PCP:  Enid Skeens., MD  Cardiologist:  Jenne Campus, MD    Referring MD: Enid Skeens., MD   Chief Complaint  Patient presents with  . Follow-up  I am scheduled to have atrial fibrillation ablation  History of Present Illness:    [name] is a 65 y.o. female with past medical history significant for paroxysmal atrial fibrillation, essential hypertension, diabetes, history of cardiomyopathy with improvement left ventricle ejection fraction to 6065% last year but now we have deterioration of left ventricle ejection fraction to 40 to 45%, on top of that mitral valve regurgitation that previously was graded as mild to moderate now became moderate to severe.  She also have elevation of the pulmonary artery pressure to 54 mmHg. She did see EP team for consideration of atrial fibrillation ablation and she is already scheduled to have the procedure done in 25 May which is in a month from now.  She is excited about this.  However brought her today to my office to talk about results of her echocardiogram.  She described to have some shortness of breath exertional but not unusual.  No swelling of lower extremities no recently paroxysmal nocturnal dyspnea.  Overall she seems to be compensated.  Described to have some palpitations specially evening time when she lay down sometimes at night.  Past Medical History:  Diagnosis Date  . Acquired thrombophilia ([place]) 01/29/2020  . AKI (acute kidney injury) ([place]) 11/20/2019  . Allergy 09/08/2016  . AMS (altered mental status)   . Anemia in end-stage renal disease ([place]) 07/16/2019  . Anemia, normocytic normochromic 07/27/2018  . Ankle joint pain 04/09/2018  . Arthritis    "back, knees, arms, wrists" (05/15/2013)  . Asthma   . Asymmetrical left sensorineural hearing loss 07/31/2019  . Atrial fibrillation ([place]) 11/15/2016  . Breast cancer ([place])    . C. difficile colitis   . Calculus of kidney 08/12/2012  . CHF (congestive heart failure) ([place])    "mild" (05/15/2013)  . CHF (congestive heart failure) ([place])    "mild" (05/15/2013)  . Chronic bronchitis ([place])   . Chronic lower back pain   . Chronic non-seasonal allergic rhinitis 11/15/2016  . Chronic systolic congestive heart failure ([place])   . Chronic UTI   . Cough variant asthma vs uacs  07/24/2017   Onset in her 20s some better p rx with allergy shots in her 30s Singulair trial 11/15/16 >  Improved 01/18/17:  FEV1 1.29 L (61%)  Ratio 75  Still on ACEi as of 07/24/2017  Spirometry 06/28/18   FEV1 1.3 (61%)  Ratio 77 with min curvature   - Allergy profile 07/26/2018 >  Eos 1.2 /  IgE  420  RAST Pos mold only   . Diabetes mellitus type 2 in obese ([place])   . Diarrhea 11/15/2016  . Dilated cardiomyopathy ([place]) 07/16/2019  . DOE (dyspnea on exertion) 07/27/2018   Onset early 2019 assoc with uacs while on ACEi - 06/28/18    Walked RA x one lap =  approx 250 ft - stopped due to sob with sats still high 90's     . Dysphagia   . Dysrhythmia    atrial fib/dr [facility]  . Family history of anesthesia complication    "my mother also had PONV" (05/15/2013)  . Fever 10/29/2017  . Foot pain 04/09/2018  . GERD (gastroesophageal  reflux disease)   . Heart murmur   . High cholesterol   . History of breast cancer in female 07/19/2013  . HTN (hypertension) 05/08/2013  . Hydronephrosis of right kidney   . Hyperlipidemia 11/15/2016  . Hyponatremia 10/29/2017  . IDA (iron deficiency anemia) 04/19/2017  . Infection   . Kidney stones   . Left breast mass 07/19/2013  . Low back pain 05/01/2018  . Migraine    "haven't had one in the early 2000's" (05/15/2013)  . Mitral regurgitation 09/03/2020  . Mixed incontinence 07/24/2018  . Nephrolithiasis 11/15/2016  . Other chronic cystitis 05/19/2011  . PAF (paroxysmal atrial fibrillation) ([place])   . Personal history of chemotherapy   . Personal history  of radiation therapy   . Pneumonia    "used to be chronic; last time I had it was 2013" (05/15/2013)  . PONV (postoperative nausea and vomiting)   . Recurrent sinusitis 09/08/2016  . Recurrent UTI 11/20/2014  . Recurrent UTI (urinary tract infection)    "from the continuous kidney stones; take Macrodantin qd" (05/15/2013)  . Respiratory failure requiring intubation ([place]) 05/08/2013  . Restrictive lung disease 01/18/2017  . Sacroiliac joint pain 05/16/2018  . Sepsis ([place]) 05   kidney stone infection  . Septic shock ([place]) 09/23/2018  . Simple chronic bronchitis ([place]) 11/15/2016  . Strep throat 10/29/2017  . Tachypnea   . Tension headache   . Type II diabetes mellitus ([place])   . Type II or unspecified type diabetes mellitus without mention of complication, not stated as uncontrolled 05/11/2013  . Urinary tract stones 05/19/2011  . Urinary urgency 07/24/2018    Past Surgical History:  Procedure Laterality Date  . 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 BREAST BIOPSY Left 02/2001  . BREAST LUMPECTOMY Left 02/2001  . BREAST LUMPECTOMY Left 09/23/2013   Procedure: LEFT LUMPECTOMY WITH SPECIMEN MAMMOGRAM;  Surgeon: Adin Hector, MD;  Location: [facility];  Service: General;  Laterality: Left;  . COLONOSCOPY    . DIAGNOSTIC LAPAROSCOPY     cyst-near ovary  . LITHOTRIPSY     "2-3 times prior to 2002" (05/15/2013)  . SPINAL FUSION N/A 05/08/2013   Procedure: T9-S1 INSTRUMENTED FUSION T12 -S1 DECOMPRESSION;  Surgeon: Melina Schools, MD;  Location: [facility];  Service: Orthopedics;  Laterality: N/A;  . TUBAL LIGATION Bilateral 1985    Current Medications: Current Meds  Medication Sig  . acetaminophen (TYLENOL) 500 MG tablet Take 500 mg by mouth every 6 (six) hours as needed for mild pain or headache.   Marland Kitchen apixaban (ELIQUIS) 5 MG TABS tablet Take 5 mg by mouth 2 (two) times daily.  . Ascorbic Acid  (VITAMIN C) 1000 MG tablet Take 1,000 mg by mouth daily.  Marland Kitchen atorvastatin (LIPITOR) 10 MG tablet Take 10 mg by mouth daily.  . carvedilol (COREG) 6.25 MG tablet Take 6.25 mg by mouth 2 (two) times daily with a meal. Take 2 times daily and if out of rhythm can take an additional tablet  . flecainide (TAMBOCOR) 50 MG tablet Take 50 mg by mouth 2 (two) times daily.  . fluticasone (FLONASE) 50 MCG/ACT nasal spray Place 1 spray into both nostrils daily as needed for allergies or rhinitis.  . furosemide (LASIX) 20 MG tablet Take 20 mg by mouth daily as needed for edema.  Marland Kitchen glipiZIDE (GLUCOTROL XL) 10 MG 24 hr tablet Take 10 mg by mouth  in the morning and at bedtime.   Marland Kitchen glucagon (GLUCAGON EMERGENCY) 1 MG injection Inject 1 mg into the muscle as needed (When glucose drop low levels).  . loratadine (CLARITIN) 10 MG tablet Take 10 mg by mouth daily.  . metoprolol tartrate (LOPRESSOR) 100 MG tablet Take 1 tablet (100 mg total) two hours prior to CT testing  . montelukast (SINGULAIR) 10 MG tablet Take 10 mg by mouth at bedtime.  . Multiple Vitamins-Minerals (ZINC PO) Take 1 tablet by mouth daily. Unknown strength  . omeprazole (PRILOSEC) 20 MG capsule Take 20 mg by mouth daily.   Marland Kitchen oxybutynin (DITROPAN-XL) 10 MG 24 hr tablet Take 10 mg by mouth daily.  . sertraline (ZOLOFT) 50 MG tablet Take 50 mg by mouth daily.     Allergies:   Pneumococcal vaccines, Cleocin [clindamycin hcl], Morphine and related, Other, Ativan [lorazepam], and Buprenorphine hcl   Social History   Socioeconomic History  . Marital status: Married    Spouse name: Not on file  . Number of children: Not on file  . Years of education: Not on file  . Highest education level: Not on file  Occupational History  . Not on file  Tobacco Use  . Smoking status: Never Smoker  . Smokeless tobacco: Never Used  . Tobacco comment: Mother & Father smoked  Vaping Use  . Vaping Use: Never used  Substance and Sexual Activity  . Alcohol use: No     Alcohol/week: 0.0 standard drinks  . Drug use: No  . Sexual activity: Yes  Other Topics Concern  . Not on file  Social History Narrative   [facility] (11/15/16):   Patient's originally from [place]. Has always lived in [place]. Currently has an outside dog. Remote exposure to [place] when her children were young. No known mold exposure. 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    Social Determinants of Health   Financial Resource Strain: Not on file  Food Insecurity: Not on file  Transportation Needs: Not on file  Physical Activity: Not on file  Stress: Not on file  Social Connections: Not on file     Family History: The patient's family history includes Breast cancer in her mother; COPD in her mother; Environmental Allergies in her son and another family member; Heart disease in her mother; Kidney cancer in her father; Liver cancer in her brother; Lupus in an other family member. ROS:   Please see the history of present illness.    All 14 point review of systems negative except as described per history of present illness  EKGs/Labs/Other Studies Reviewed:      Recent Labs: 09/01/2020: Pro B Natriuretic peptide (BNP) 1,085.0 10/19/2020: ALT 21; BUN 32; Creatinine 1.21; Hemoglobin 12.3; Platelet Count 224; Potassium 4.5; Sodium 135  Recent Lipid Panel    Component Value Date/Time   TRIG 155 (H) 05/08/2013 2023    Physical Exam:    VS:  Ht 4\' 11"  (1.499 m)   Wt 153 lb (69.4 kg)   BMI 30.90 kg/m     Wt Readings from Last 3 Encounters:  10/26/20 153 lb (69.4 kg)  10/19/20 156 lb 12.8 oz (71.1 kg)  09/29/20 152 lb (68.9 kg)     GEN:  Well nourished, well developed in no acute distress HEENT: Normal NECK: No JVD; No carotid bruits LYMPHATICS: No lymphadenopathy CARDIAC: RRR, holosystolic murmur grade 2/6 best at apex, no rubs, no gallops RESPIRATORY:  Clear to auscultation without  rales, wheezing or  rhonchi  ABDOMEN: Soft, non-tender, non-distended MUSCULOSKELETAL:  No edema; No deformity  SKIN: Warm and dry LOWER EXTREMITIES: no swelling NEUROLOGIC:  Alert and oriented x 3 PSYCHIATRIC:  Normal affect   ASSESSMENT:    1. Dilated cardiomyopathy ([place])   2. Chronic systolic congestive heart failure ([place])   3. Nonrheumatic mitral valve regurgitation   4. PAF (paroxysmal atrial fibrillation) ([facility])    PLAN:    In order of problems listed above:  1. Dilated cardiomyopathy: She is already on Coreg which I will continue.  I will also ask her to have Chem-7 done today if Chem-7 is acceptable we will start her with Entresto 2426 twice daily.  The plan will be to maximize medical therapy and see if we can improve left ventricle ejection fraction. 2. Congestive heart failure she seems to be compensated on physical exam plan as outlined above. 3. Nonrheumatic mitral valve regurgitation: Plan is to hopefully improve left ventricle ejection fraction and then recheck degree of mitral valve regurgitation. 4. Paroxysmal atrial fibrillation.  She does have mildly diminished ejection fraction 40 to 45% scheduled to have atrial fibrillation ablation in a month.  I will send a message to Dr. Curt Bears with a question about continuation of flecainide in this clinical scenario as well as plans for of atrial fibrillation ablation.  I had a conversation with Dr. Curt Bears regarding her situation.  We elected to proceed with transesophageal echocardiogram to better the timing mechanism of mitral regurgitation as it probably valve problem or is it secondary to cardiomyopathy.  I did explain procedure to him including all risk benefits as well as alternatives she is willing to proceed.  If she will require some surgical intervention on her mitral valve then maze procedure can be done at that time.  We will discontinue her flecainide.  Medication Adjustments/Labs and Tests Ordered: Current medicines are reviewed at  length with the patient today.  Concerns regarding medicines are outlined above.  No orders of the defined types were placed in this encounter.  Medication changes: No orders of the defined types were placed in this encounter.   Signed, Park Liter, MD, [facility] 10/26/2020 3:37 PM    [facility]

## 2020-10-26 NOTE — H&P (View-Only) (Signed)
Cardiology Office Note:    Date:  10/26/2020   ID:  [name], DOB Feb 22, 1956, MRN [id number]  PCP:  Enid Skeens., MD  Cardiologist:  Jenne Campus, MD    Referring MD: Enid Skeens., MD   Chief Complaint  Patient presents with  . Follow-up  I am scheduled to have atrial fibrillation ablation  History of Present Illness:    MADALEINE [name] is a 65 y.o. female with past medical history significant for paroxysmal atrial fibrillation, essential hypertension, diabetes, history of cardiomyopathy with improvement left ventricle ejection fraction to 6065% last year but now we have deterioration of left ventricle ejection fraction to 40 to 45%, on top of that mitral valve regurgitation that previously was graded as mild to moderate now became moderate to severe.  She also have elevation of the pulmonary artery pressure to 54 mmHg. She did see EP team for consideration of atrial fibrillation ablation and she is already scheduled to have the procedure done in 25 May which is in a month from now.  She is excited about this.  However brought her today to my office to talk about results of her echocardiogram.  She described to have some shortness of breath exertional but not unusual.  No swelling of lower extremities no recently paroxysmal nocturnal dyspnea.  Overall she seems to be compensated.  Described to have some palpitations specially evening time when she lay down sometimes at night.  Past Medical History:  Diagnosis Date  . Acquired thrombophilia ([place]) 01/29/2020  . AKI (acute kidney injury) ([place]) 11/20/2019  . Allergy 09/08/2016  . AMS (altered mental status)   . Anemia in end-stage renal disease ([place]) 07/16/2019  . Anemia, normocytic normochromic 07/27/2018  . Ankle joint pain 04/09/2018  . Arthritis    "back, knees, arms, wrists" (05/15/2013)  . Asthma   . Asymmetrical left sensorineural hearing loss 07/31/2019  . Atrial fibrillation ([place]) 11/15/2016  . Breast cancer ([place])    . C. difficile colitis   . Calculus of kidney 08/12/2012  . CHF (congestive heart failure) ([place])    "mild" (05/15/2013)  . CHF (congestive heart failure) ([place])    "mild" (05/15/2013)  . Chronic bronchitis ([place])   . Chronic lower back pain   . Chronic non-seasonal allergic rhinitis 11/15/2016  . Chronic systolic congestive heart failure ([place])   . Chronic UTI   . Cough variant asthma vs uacs  07/24/2017   Onset in her 20s some better p rx with allergy shots in her 30s Singulair trial 11/15/16 >  Improved 01/18/17:  FEV1 1.29 L (61%)  Ratio 75  Still on ACEi as of 07/24/2017  Spirometry 06/28/18   FEV1 1.3 (61%)  Ratio 77 with min curvature   - Allergy profile 07/26/2018 >  Eos 1.2 /  IgE  420  RAST Pos mold only   . Diabetes mellitus type 2 in obese ([place])   . Diarrhea 11/15/2016  . Dilated cardiomyopathy ([place]) 07/16/2019  . DOE (dyspnea on exertion) 07/27/2018   Onset early 2019 assoc with uacs while on ACEi - 06/28/18    Walked RA x one lap =  approx 250 ft - stopped due to sob with sats still high 90's     . Dysphagia   . Dysrhythmia    atrial fib/dr [facility]  . Family history of anesthesia complication    "my mother also had PONV" (05/15/2013)  . Fever 10/29/2017  . Foot pain 04/09/2018  . GERD (gastroesophageal  reflux disease)   . Heart murmur   . High cholesterol   . History of breast cancer in female 07/19/2013  . HTN (hypertension) 05/08/2013  . Hydronephrosis of right kidney   . Hyperlipidemia 11/15/2016  . Hyponatremia 10/29/2017  . IDA (iron deficiency anemia) 04/19/2017  . Infection   . Kidney stones   . Left breast mass 07/19/2013  . Low back pain 05/01/2018  . Migraine    "haven't had one in the early 2000's" (05/15/2013)  . Mitral regurgitation 09/03/2020  . Mixed incontinence 07/24/2018  . Nephrolithiasis 11/15/2016  . Other chronic cystitis 05/19/2011  . PAF (paroxysmal atrial fibrillation) ([place])   . Personal history of chemotherapy   . Personal history  of radiation therapy   . Pneumonia    "used to be chronic; last time I had it was 2013" (05/15/2013)  . PONV (postoperative nausea and vomiting)   . Recurrent sinusitis 09/08/2016  . Recurrent UTI 11/20/2014  . Recurrent UTI (urinary tract infection)    "from the continuous kidney stones; take Macrodantin qd" (05/15/2013)  . Respiratory failure requiring intubation ([place]) 05/08/2013  . Restrictive lung disease 01/18/2017  . Sacroiliac joint pain 05/16/2018  . Sepsis ([place]) 05   kidney stone infection  . Septic shock ([place]) 09/23/2018  . Simple chronic bronchitis ([place]) 11/15/2016  . Strep throat 10/29/2017  . Tachypnea   . Tension headache   . Type II diabetes mellitus ([place])   . Type II or unspecified type diabetes mellitus without mention of complication, not stated as uncontrolled 05/11/2013  . Urinary tract stones 05/19/2011  . Urinary urgency 07/24/2018    Past Surgical History:  Procedure Laterality Date  . 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 BREAST BIOPSY Left 02/2001  . BREAST LUMPECTOMY Left 02/2001  . BREAST LUMPECTOMY Left 09/23/2013   Procedure: LEFT LUMPECTOMY WITH SPECIMEN MAMMOGRAM;  Surgeon: Adin Hector, MD;  Location: [facility];  Service: General;  Laterality: Left;  . COLONOSCOPY    . DIAGNOSTIC LAPAROSCOPY     cyst-near ovary  . LITHOTRIPSY     "2-3 times prior to 2002" (05/15/2013)  . SPINAL FUSION N/A 05/08/2013   Procedure: T9-S1 INSTRUMENTED FUSION T12 -S1 DECOMPRESSION;  Surgeon: Melina Schools, MD;  Location: [facility];  Service: Orthopedics;  Laterality: N/A;  . TUBAL LIGATION Bilateral 1985    Current Medications: Current Meds  Medication Sig  . acetaminophen (TYLENOL) 500 MG tablet Take 500 mg by mouth every 6 (six) hours as needed for mild pain or headache.   Marland Kitchen apixaban (ELIQUIS) 5 MG TABS tablet Take 5 mg by mouth 2 (two) times daily.  . Ascorbic Acid  (VITAMIN C) 1000 MG tablet Take 1,000 mg by mouth daily.  Marland Kitchen atorvastatin (LIPITOR) 10 MG tablet Take 10 mg by mouth daily.  . carvedilol (COREG) 6.25 MG tablet Take 6.25 mg by mouth 2 (two) times daily with a meal. Take 2 times daily and if out of rhythm can take an additional tablet  . flecainide (TAMBOCOR) 50 MG tablet Take 50 mg by mouth 2 (two) times daily.  . fluticasone (FLONASE) 50 MCG/ACT nasal spray Place 1 spray into both nostrils daily as needed for allergies or rhinitis.  . furosemide (LASIX) 20 MG tablet Take 20 mg by mouth daily as needed for edema.  Marland Kitchen glipiZIDE (GLUCOTROL XL) 10 MG 24 hr tablet Take 10 mg by mouth  in the morning and at bedtime.   Marland Kitchen glucagon (GLUCAGON EMERGENCY) 1 MG injection Inject 1 mg into the muscle as needed (When glucose drop low levels).  . loratadine (CLARITIN) 10 MG tablet Take 10 mg by mouth daily.  . metoprolol tartrate (LOPRESSOR) 100 MG tablet Take 1 tablet (100 mg total) two hours prior to CT testing  . montelukast (SINGULAIR) 10 MG tablet Take 10 mg by mouth at bedtime.  . Multiple Vitamins-Minerals (ZINC PO) Take 1 tablet by mouth daily. Unknown strength  . omeprazole (PRILOSEC) 20 MG capsule Take 20 mg by mouth daily.   Marland Kitchen oxybutynin (DITROPAN-XL) 10 MG 24 hr tablet Take 10 mg by mouth daily.  . sertraline (ZOLOFT) 50 MG tablet Take 50 mg by mouth daily.     Allergies:   Pneumococcal vaccines, Cleocin [clindamycin hcl], Morphine and related, Other, Ativan [lorazepam], and Buprenorphine hcl   Social History   Socioeconomic History  . Marital status: Married    Spouse name: Not on file  . Number of children: Not on file  . Years of education: Not on file  . Highest education level: Not on file  Occupational History  . Not on file  Tobacco Use  . Smoking status: Never Smoker  . Smokeless tobacco: Never Used  . Tobacco comment: Mother & Father smoked  Vaping Use  . Vaping Use: Never used  Substance and Sexual Activity  . Alcohol use: No     Alcohol/week: 0.0 standard drinks  . Drug use: No  . Sexual activity: Yes  Other Topics Concern  . Not on file  Social History Narrative    Pulmonary (11/15/16):   Patient's originally from [place]. Has always lived in [place]. Currently has an outside dog. Remote exposure to [place] when her children were young. No known mold exposure. 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    Social Determinants of Health   Financial Resource Strain: Not on file  Food Insecurity: Not on file  Transportation Needs: Not on file  Physical Activity: Not on file  Stress: Not on file  Social Connections: Not on file     Family History: The patient's family history includes Breast cancer in her mother; COPD in her mother; Environmental Allergies in her son and another family member; Heart disease in her mother; Kidney cancer in her father; Liver cancer in her brother; Lupus in an other family member. ROS:   Please see the history of present illness.    All 14 point review of systems negative except as described per history of present illness  EKGs/Labs/Other Studies Reviewed:      Recent Labs: 09/01/2020: Pro B Natriuretic peptide (BNP) 1,085.0 10/19/2020: ALT 21; BUN 32; Creatinine 1.21; Hemoglobin 12.3; Platelet Count 224; Potassium 4.5; Sodium 135  Recent Lipid Panel    Component Value Date/Time   TRIG 155 (H) 05/08/2013 2023    Physical Exam:    VS:  Ht 4\' 11"  (1.499 m)   Wt 153 lb (69.4 kg)   BMI 30.90 kg/m     Wt Readings from Last 3 Encounters:  10/26/20 153 lb (69.4 kg)  10/19/20 156 lb 12.8 oz (71.1 kg)  09/29/20 152 lb (68.9 kg)     GEN:  Well nourished, well developed in no acute distress HEENT: Normal NECK: No JVD; No carotid bruits LYMPHATICS: No lymphadenopathy CARDIAC: RRR, holosystolic murmur grade 2/6 best at apex, no rubs, no gallops RESPIRATORY:  Clear to auscultation without  rales, wheezing or  rhonchi  ABDOMEN: Soft, non-tender, non-distended MUSCULOSKELETAL:  No edema; No deformity  SKIN: Warm and dry LOWER EXTREMITIES: no swelling NEUROLOGIC:  Alert and oriented x 3 PSYCHIATRIC:  Normal affect   ASSESSMENT:    1. Dilated cardiomyopathy ([place])   2. Chronic systolic congestive heart failure ([place])   3. Nonrheumatic mitral valve regurgitation   4. PAF (paroxysmal atrial fibrillation) ([facility])    PLAN:    In order of problems listed above:  1. Dilated cardiomyopathy: She is already on Coreg which I will continue.  I will also ask her to have Chem-7 done today if Chem-7 is acceptable we will start her with Entresto 2426 twice daily.  The plan will be to maximize medical therapy and see if we can improve left ventricle ejection fraction. 2. Congestive heart failure she seems to be compensated on physical exam plan as outlined above. 3. Nonrheumatic mitral valve regurgitation: Plan is to hopefully improve left ventricle ejection fraction and then recheck degree of mitral valve regurgitation. 4. Paroxysmal atrial fibrillation.  She does have mildly diminished ejection fraction 40 to 45% scheduled to have atrial fibrillation ablation in a month.  I will send a message to Dr. Curt Bears with a question about continuation of flecainide in this clinical scenario as well as plans for of atrial fibrillation ablation.  I had a conversation with Dr. Curt Bears regarding her situation.  We elected to proceed with transesophageal echocardiogram to better the timing mechanism of mitral regurgitation as it probably valve problem or is it secondary to cardiomyopathy.  I did explain procedure to him including all risk benefits as well as alternatives she is willing to proceed.  If she will require some surgical intervention on her mitral valve then maze procedure can be done at that time.  We will discontinue her flecainide.  Medication Adjustments/Labs and Tests Ordered: Current medicines are reviewed at  length with the patient today.  Concerns regarding medicines are outlined above.  No orders of the defined types were placed in this encounter.  Medication changes: No orders of the defined types were placed in this encounter.   Signed, Park Liter, MD, [facility] 10/26/2020 3:37 PM    [facility]

## 2020-10-26 NOTE — Patient Instructions (Addendum)
Medication Instructions:  Your physician has recommended you make the following change in your medication:  STOP: Flecainide   *If you need a refill on your cardiac medications before your next appointment, please call your pharmacy*   Lab Work: Your physician recommends that you return for lab work today: bmp, cbc  If you have labs (blood work) drawn today and your tests are completely normal, you will receive your results only by: Marland Kitchen MyChart Message (if you have MyChart) OR . A paper copy in the mail If you have any lab test that is abnormal or we need to change your treatment, we will call you to review the results.   Testing/Procedures:  You are scheduled for a TEE on 11/06/20 with Dr. Harrington Challenger.  Please arrive at the [facility] (Main Entrance A) at [facility]: [address] [place], [place] [address] at 9 am.   DIET: Nothing to eat or drink after midnight except a sip of water with medications (see medication instructions below)  Medication Instructions: Hold Lasix, and glipizide the morning of the TEE     Labs: Labs today    You must have a responsible person to drive you home and stay in the waiting area during your procedure. Failure to do so could result in cancellation.  Bring your insurance cards.  *Special Note: Every effort is made to have your procedure done on time. Occasionally there are emergencies that occur at the hospital that may cause delays. Please be patient if a delay does occur.     Follow-Up: At [facility], you and your health needs are our priority.  As part of our continuing mission to provide you with exceptional heart care, we have created designated Provider Care Teams.  These Care Teams include your primary Cardiologist (physician) and Advanced Practice Providers (APPs -  Physician Assistants and Nurse Practitioners) who all work together to provide you with the care you need, when you need it.  We recommend signing up for the patient  portal called "MyChart".  Sign up information is provided on this After Visit Summary.  MyChart is used to connect with patients for Virtual Visits (Telemedicine).  Patients are able to view lab/test results, encounter notes, upcoming appointments, etc.  Non-urgent messages can be sent to your provider as well.   To learn more about what you can do with MyChart, go to [email or web address].    Your next appointment:   3 week(s)  The format for your next appointment:   In Person  Provider:   Jenne Campus, MD   Other Instructions   Transesophageal Echocardiogram Transesophageal echocardiogram (TEE) is a test that uses sound waves to take pictures of your heart. TEE is done by passing a small probe attached to a flexible tube down the part of the body that moves food from your mouth to your stomach (esophagus). The pictures give clear images of your heart. This can help your doctor see if there are problems with your heart. Tell a doctor about:  Any allergies you have.  All medicines you are taking. This includes vitamins, herbs, eye drops, creams, and over-the-counter medicines.  Any problems you or family members have had with anesthetic medicines.  Any blood disorders you have.  Any surgeries you have had.  Any medical conditions you have.  Any swallowing problems.  Whether you have or have had a blockage in the part of the body that moves food from your mouth to your stomach.  Whether you are  pregnant or may be pregnant. What are the risks? In general, this is a safe procedure. But, problems may occur, such as:  Damage to nearby structures or organs.  A tear in the part of the body that moves food from your mouth to your stomach.  Irregular heartbeat.  Hoarse voice or trouble swallowing.  Bleeding. What happens before the procedure? Medicines  Ask your doctor about changing or stopping: ? Your normal medicines. ? Vitamins, herbs, and  supplements. ? Over-the-counter medicines.  Do not take aspirin or ibuprofen unless you are told to. General instructions  Follow instructions from your doctor about what you cannot eat or drink.  You will take out any dentures or dental retainers.  Plan to have a responsible adult take you home from the hospital or clinic.  Plan to have a responsible adult care for you for the time you are told after you leave the hospital or clinic. This is important. What happens during the procedure?  An IV will be put into one of your veins.  You may be given: ? A sedative. This medicine helps you relax. ? A medicine to numb the back of your throat. This may be sprayed or gargled.  Your blood pressure, heart rate, and breathing will be watched.  You may be asked to lie on your left side.  A bite block will be placed in your mouth. This keeps you from biting the tube.  The tip of the probe will be placed into the back of your mouth.  You will be asked to swallow.  Your doctor will take pictures of your heart.  The probe and bite block will be taken out after the test is done. The procedure may vary among doctors and hospitals.   What can I expect after the procedure?  You will be monitored until you leave the hospital or clinic. This includes checking your blood pressure, heart rate, breathing rate, and blood oxygen level.  Your throat may feel sore and numb. This will get better over time. You will not be allowed to eat or drink until the numbness has gone away.  It is common to have a sore throat for a day or two.  It is up to you to get the results of your procedure. Ask how to get your results when they are ready. Follow these instructions at home:  If you were given a sedative during your procedure, do not drive or use machines until your doctor says that it is safe.  Return to your normal activities when your doctor says that it is safe.  Keep all follow-up  visits. Summary  TEE is a test that uses sound waves to take pictures of your heart.  You will be given a medicine to help you relax.  Do not drive or use machines until your doctor says that it is safe. This information is not intended to replace advice given to you by your health care provider. Make sure you discuss any questions you have with your health care provider. Document Revised: 02/11/2020 Document Reviewed: 02/11/2020 Elsevier Patient Education  2021 [employer].

## 2020-10-27 LAB — BASIC METABOLIC PANEL
BUN/Creatinine Ratio: 23 (ref 12–28)
BUN: 34 mg/dL — ABNORMAL HIGH (ref 8–27)
CO2: 18 mmol/L — ABNORMAL LOW (ref 20–29)
Calcium: 9.5 mg/dL (ref 8.7–10.3)
Chloride: 101 mmol/L (ref 96–106)
Creatinine, Ser: 1.45 mg/dL — ABNORMAL HIGH (ref 0.57–1.00)
Glucose: 303 mg/dL — ABNORMAL HIGH (ref 65–99)
Potassium: 5.3 mmol/L — ABNORMAL HIGH (ref 3.5–5.2)
Sodium: 138 mmol/L (ref 134–144)
eGFR: 40 mL/min/{1.73_m2} — ABNORMAL LOW (ref >59)

## 2020-10-27 LAB — CBC
Hematocrit: 38.7 % (ref 34.0–46.6)
Hemoglobin: 12.6 g/dL (ref 11.1–15.9)
MCH: 30 pg (ref 26.6–33.0)
MCHC: 32.6 g/dL (ref 31.5–35.7)
MCV: 92 fL (ref 79–97)
Platelets: 212 10*3/uL (ref 150–450)
RBC: 4.2 x10E6/uL (ref 3.77–5.28)
RDW: 14.9 % (ref 11.7–15.4)
WBC: 8.3 10*3/uL (ref 3.4–10.8)

## 2020-10-29 ENCOUNTER — Telehealth: Payer: Self-pay

## 2020-10-29 DIAGNOSIS — E782 Mixed hyperlipidemia: Secondary | ICD-10-CM

## 2020-10-29 DIAGNOSIS — I10 Essential (primary) hypertension: Secondary | ICD-10-CM

## 2020-10-29 NOTE — Telephone Encounter (Signed)
-----   Message from Park Liter, MD sent at 10/28/2020  5:18 PM EDT ----- Labs acceptable, stable, continue present management, Chem-7 need to be repeated in about 3 weeks

## 2020-10-29 NOTE — Telephone Encounter (Signed)
Patient notified of results.

## 2020-11-02 ENCOUNTER — Telehealth: Payer: Self-pay | Admitting: Cardiology

## 2020-11-02 NOTE — Telephone Encounter (Signed)
PT would like to know if she needs to stop taking her eliquis before her esophageal echo on Friday  Please advise  [phone]   [facility]

## 2020-11-03 ENCOUNTER — Other Ambulatory Visit (HOSPITAL_COMMUNITY)
Admission: RE | Admit: 2020-11-03 | Discharge: 2020-11-03 | Disposition: A | Discharge status: Home / Self Care | Payer: Medicare Other | Source: Ambulatory Visit | Attending: Internal Medicine | Admitting: Internal Medicine

## 2020-11-03 DIAGNOSIS — Z20822 Contact with and (suspected) exposure to covid-19: Secondary | ICD-10-CM | POA: Diagnosis not present

## 2020-11-03 DIAGNOSIS — Z01812 Encounter for preprocedural laboratory examination: Secondary | ICD-10-CM | POA: Insufficient documentation

## 2020-11-03 LAB — BASIC METABOLIC PANEL
BUN/Creatinine Ratio: 24 (ref 12–28)
BUN: 33 mg/dL — ABNORMAL HIGH (ref 8–27)
CO2: 18 mmol/L — ABNORMAL LOW (ref 20–29)
Calcium: 9.4 mg/dL (ref 8.7–10.3)
Chloride: 106 mmol/L (ref 96–106)
Creatinine, Ser: 1.37 mg/dL — ABNORMAL HIGH (ref 0.57–1.00)
Glucose: 208 mg/dL — ABNORMAL HIGH (ref 65–99)
Potassium: 4.9 mmol/L (ref 3.5–5.2)
Sodium: 139 mmol/L (ref 134–144)
eGFR: 43 mL/min/{1.73_m2} — ABNORMAL LOW (ref >59)

## 2020-11-04 LAB — SARS CORONAVIRUS 2 (TAT 6-24 HRS): SARS Coronavirus 2: NEGATIVE

## 2020-11-06 ENCOUNTER — Other Ambulatory Visit: Payer: Self-pay

## 2020-11-06 ENCOUNTER — Ambulatory Visit (HOSPITAL_BASED_OUTPATIENT_CLINIC_OR_DEPARTMENT_OTHER): Payer: Medicare Other

## 2020-11-06 ENCOUNTER — Encounter (HOSPITAL_COMMUNITY)
Admission: RE | Disposition: A | Discharge status: Home / Self Care | Payer: Self-pay | Source: Ambulatory Visit | Attending: Internal Medicine

## 2020-11-06 ENCOUNTER — Ambulatory Visit (HOSPITAL_COMMUNITY)
Admission: RE | Admit: 2020-11-06 | Discharge: 2020-11-06 | Disposition: A | Discharge status: Home / Self Care | Payer: Medicare Other | Source: Ambulatory Visit | Attending: Internal Medicine | Admitting: Internal Medicine

## 2020-11-06 ENCOUNTER — Ambulatory Visit (HOSPITAL_COMMUNITY): Payer: Medicare Other | Admitting: Certified Registered Nurse Anesthetist

## 2020-11-06 ENCOUNTER — Encounter (HOSPITAL_COMMUNITY): Payer: Self-pay | Admitting: Internal Medicine

## 2020-11-06 DIAGNOSIS — I340 Nonrheumatic mitral (valve) insufficiency: Secondary | ICD-10-CM

## 2020-11-06 DIAGNOSIS — Z7984 Long term (current) use of oral hypoglycemic drugs: Secondary | ICD-10-CM | POA: Insufficient documentation

## 2020-11-06 DIAGNOSIS — I5022 Chronic systolic (congestive) heart failure: Secondary | ICD-10-CM | POA: Diagnosis not present

## 2020-11-06 DIAGNOSIS — I110 Hypertensive heart disease with heart failure: Secondary | ICD-10-CM | POA: Diagnosis not present

## 2020-11-06 DIAGNOSIS — Q211 Atrial septal defect: Secondary | ICD-10-CM | POA: Insufficient documentation

## 2020-11-06 DIAGNOSIS — I361 Nonrheumatic tricuspid (valve) insufficiency: Secondary | ICD-10-CM

## 2020-11-06 DIAGNOSIS — Z79899 Other long term (current) drug therapy: Secondary | ICD-10-CM | POA: Diagnosis not present

## 2020-11-06 DIAGNOSIS — Z887 Allergy status to serum and vaccine status: Secondary | ICD-10-CM | POA: Diagnosis not present

## 2020-11-06 DIAGNOSIS — Z888 Allergy status to other drugs, medicaments and biological substances status: Secondary | ICD-10-CM | POA: Diagnosis not present

## 2020-11-06 DIAGNOSIS — Z885 Allergy status to narcotic agent status: Secondary | ICD-10-CM | POA: Insufficient documentation

## 2020-11-06 DIAGNOSIS — Z7901 Long term (current) use of anticoagulants: Secondary | ICD-10-CM | POA: Diagnosis not present

## 2020-11-06 DIAGNOSIS — I420 Dilated cardiomyopathy: Secondary | ICD-10-CM | POA: Diagnosis not present

## 2020-11-06 DIAGNOSIS — I480 Paroxysmal atrial fibrillation: Secondary | ICD-10-CM | POA: Diagnosis not present

## 2020-11-06 HISTORY — PX: TEE WITHOUT CARDIOVERSION: SHX5443

## 2020-11-06 LAB — ECHO TEE
MV M vel: 5.41 m/s
MV Peak grad: 116.9 mmHg
Radius: 0.53 cm

## 2020-11-06 LAB — GLUCOSE, CAPILLARY: Glucose-Capillary: 168 mg/dL — ABNORMAL HIGH (ref 70–99)

## 2020-11-06 SURGERY — ECHOCARDIOGRAM, TRANSESOPHAGEAL
Anesthesia: Monitor Anesthesia Care

## 2020-11-06 MED ORDER — LIDOCAINE VISCOUS HCL 2 % MT SOLN
OROMUCOSAL | Status: AC
  Filled 2020-11-06: qty 15

## 2020-11-06 MED ORDER — PROPOFOL 10 MG/ML IV BOLUS
INTRAVENOUS | Status: DC | PRN
  Administered 2020-11-06: 20 mg via INTRAVENOUS

## 2020-11-06 MED ORDER — SODIUM CHLORIDE 0.9 % IV SOLN: INTRAVENOUS | Status: DC | PRN

## 2020-11-06 MED ORDER — PROPOFOL 500 MG/50ML IV EMUL
INTRAVENOUS | Status: DC | PRN
  Administered 2020-11-06: 125 ug/kg/min via INTRAVENOUS

## 2020-11-06 MED ORDER — SODIUM CHLORIDE 0.9 % IV SOLN: INTRAVENOUS | Status: DC

## 2020-11-06 MED ORDER — LIDOCAINE VISCOUS HCL 2 % MT SOLN
OROMUCOSAL | Status: DC | PRN
  Administered 2020-11-06: 20 mL via OROMUCOSAL

## 2020-11-06 MED ORDER — PHENYLEPHRINE 40 MCG/ML (10ML) SYRINGE FOR IV PUSH (FOR BLOOD PRESSURE SUPPORT)
PREFILLED_SYRINGE | INTRAVENOUS | Status: DC | PRN
Start: 2020-11-06 — End: 2020-11-06
  Administered 2020-11-06 (×2): 80 ug via INTRAVENOUS

## 2020-11-06 NOTE — CV Procedure (Signed)
TEE  Patient anesthetized by anesthesia with Propofol intravenously Throat anesthetized with viscous lidocaine Bite guard placed TEE probe advanced to mid esophagus without difficult  Full report to follow in CV section of chart  Procedure without complications  [name] MD

## 2020-11-06 NOTE — Progress Notes (Signed)
  Echocardiogram Echocardiogram Transesophageal has been performed.  Merrie Roof F 11/06/2020, 10:49 AM

## 2020-11-06 NOTE — Interval H&P Note (Signed)
History and Physical Interval Note:  11/06/2020 9:07 AM  [name]  has presented today for surgery, with the diagnosis of MITRAL REGURGITAITON.  The various methods of treatment have been discussed with the patient and family. After consideration of risks, benefits and other options for treatment, the patient has consented to  Procedure(s): TRANSESOPHAGEAL ECHOCARDIOGRAM (TEE) (N/A) as a surgical intervention.  The patient's history has been reviewed, patient examined, no change in status, stable for surgery.  I have reviewed the patient's chart and labs.  Questions were answered to the patient's satisfaction.     [name]

## 2020-11-06 NOTE — Anesthesia Postprocedure Evaluation (Signed)
Anesthesia Post Note  Patient: [name]  Procedure(s) Performed: TRANSESOPHAGEAL ECHOCARDIOGRAM (TEE) (N/A )     Patient location during evaluation: PACU Anesthesia Type: MAC Level of consciousness: awake and alert Pain management: pain level controlled Vital Signs Assessment: post-procedure vital signs reviewed and stable Respiratory status: spontaneous breathing and respiratory function stable Cardiovascular status: stable Postop Assessment: no apparent nausea or vomiting Anesthetic complications: no   No complications documented.  Last Vitals:  Vitals:   11/06/20 1045 11/06/20 1053  BP: 103/66 103/66  Pulse: 67 66  Resp: 15 17  Temp:    SpO2: 100% 100%    Last Pain:  Vitals:   11/06/20 1053  TempSrc:   PainSc: 0-No pain                 Merlinda Frederick

## 2020-11-06 NOTE — Interval H&P Note (Signed)
History and Physical Interval Note:  11/06/2020 9:36 AM  [name]  has presented today for surgery, with the diagnosis of MITRAL REGURGITAITON.  The various methods of treatment have been discussed with the patient and family. After consideration of risks, benefits and other options for treatment, the patient has consented to  Procedure(s): TRANSESOPHAGEAL ECHOCARDIOGRAM (TEE) (N/A) as a surgical intervention.  The patient's history has been reviewed, patient examined, no change in status, stable for surgery.  I have reviewed the patient's chart and labs.  Questions were answered to the patient's satisfaction.     [name]

## 2020-11-06 NOTE — Anesthesia Preprocedure Evaluation (Addendum)
Anesthesia Evaluation  Patient identified by MRN, date of birth, ID band Patient awake    Reviewed: Allergy & Precautions, NPO status , Patient's Chart, lab work & pertinent test results  History of Anesthesia Complications (+) PONV and history of anesthetic complications  Airway Mallampati: II  TM Distance: >3 FB     Dental no notable dental hx.    Pulmonary asthma ,    breath sounds clear to auscultation       Cardiovascular hypertension, +CHF and + DOE  + dysrhythmias Atrial Fibrillation + Valvular Problems/Murmurs MR  Rhythm:Irregular Rate:Normal  ECHO 10/06/20:  Left ventricular ejection fraction, by estimation, is 40 to 45%. The left ventricle has mildly decreased function. The left ventricle demonstrates global hypokinesis. There is moderate concentric left ventricular hypertrophy. Left ventricular diastolic parameters are indeterminate. The average left ventricular global longitudinal strain is -9.0 %. The global longitudinal strain is abnormal. 2. Right ventricular systolic function is normal. The right ventricular size is normal. There is moderately elevated pulmonary artery systolic pressure. 3. Left atrial size was mildly dilated. 4. The mitral valve is calcified. There is moderate posterior mitral annular calcification. Moderate to severe mitral valve regurgitation. MR vol 63 ml, ERO 0.42 cmsq. No evidence of mitral stenosis. 5. Tricuspid valve regurgitation is moderate to severe. 6. The aortic valve is normal in structure. Aortic valve regurgitation is not visualized. No aortic stenosis is present. 7. Abdominal aorta is mildly dilated (2.3 cm). 8. The inferior vena cava is normal in size with greater than 50% respiratory variability, suggesting right atrial pressure of 3 mmHg   Neuro/Psych  Headaches, negative psych ROS   GI/Hepatic GERD  ,  Endo/Other  diabetes  Renal/GU Renal InsufficiencyRenal disease   negative genitourinary   Musculoskeletal  (+) Arthritis ,   Abdominal Normal abdominal exam  (+)   Peds  Hematology   Anesthesia Other Findings H/o breast cancer   Reproductive/Obstetrics                            Anesthesia Physical Anesthesia Plan  ASA: III  Anesthesia Plan: MAC   Post-op Pain Management:    Induction: Intravenous  PONV Risk Score and Plan: 3 and TIVA, Treatment may vary due to age or medical condition, Propofol infusion and Ondansetron  Airway Management Planned: Natural Airway  Additional Equipment: None  Intra-op Plan:   Post-operative Plan:   Informed Consent: I have reviewed the patients History and Physical, chart, labs and discussed the procedure including the risks, benefits and alternatives for the proposed anesthesia with the patient or authorized representative who has indicated his/her understanding and acceptance.       Plan Discussed with: CRNA and Anesthesiologist  Anesthesia Plan Comments:         Anesthesia Quick Evaluation

## 2020-11-06 NOTE — Transfer of Care (Signed)
Immediate Anesthesia Transfer of Care Note  Patient: [name]  Procedure(s) Performed: TRANSESOPHAGEAL ECHOCARDIOGRAM (TEE) (N/A )  Patient Location: Endoscopy Unit  Anesthesia Type:MAC  Level of Consciousness: awake, alert , patient cooperative and responds to stimulation  Airway & Oxygen Therapy: Patient Spontanous Breathing and Patient connected to nasal cannula oxygen  Post-op Assessment: Report given to RN and Post -op Vital signs reviewed and stable  Post vital signs: Reviewed and stable  Last Vitals:  Vitals Value Taken Time  BP 94/62 11/06/20 1035  Temp 36.4 C 11/06/20 1035  Pulse 65 11/06/20 1039  Resp 17 11/06/20 1039  SpO2 100 % 11/06/20 1039  Vitals shown include unvalidated device data.  Last Pain:  Vitals:   11/06/20 1035  TempSrc: Axillary  PainSc: 0-No pain         Complications: No complications documented.

## 2020-11-06 NOTE — Discharge Instructions (Signed)

## 2020-11-08 ENCOUNTER — Encounter (HOSPITAL_COMMUNITY): Payer: Self-pay | Admitting: Internal Medicine

## 2020-11-12 ENCOUNTER — Telehealth: Payer: Self-pay

## 2020-11-12 NOTE — Telephone Encounter (Signed)
Pt verbalized understanding to have her Cardiac CT cancelled and to have a repeat TEE 11/24/20 at 11:30 am... 10:00 am arrival.   COVID test 11/23/20

## 2020-11-16 ENCOUNTER — Other Ambulatory Visit: Payer: Self-pay

## 2020-11-16 ENCOUNTER — Ambulatory Visit: Payer: Federal, State, Local not specified - PPO | Admitting: Cardiology

## 2020-11-16 DIAGNOSIS — E119 Type 2 diabetes mellitus without complications: Secondary | ICD-10-CM | POA: Insufficient documentation

## 2020-11-18 ENCOUNTER — Ambulatory Visit (HOSPITAL_COMMUNITY): Payer: Medicare Other

## 2020-11-20 ENCOUNTER — Other Ambulatory Visit: Payer: Self-pay

## 2020-11-20 ENCOUNTER — Encounter: Payer: Self-pay | Admitting: Cardiology

## 2020-11-20 ENCOUNTER — Ambulatory Visit (INDEPENDENT_AMBULATORY_CARE_PROVIDER_SITE_OTHER): Payer: Medicare Other | Admitting: Cardiology

## 2020-11-20 VITALS — BP 120/70 | HR 74 | Ht 59.0 in | Wt 155.0 lb

## 2020-11-20 DIAGNOSIS — I420 Dilated cardiomyopathy: Secondary | ICD-10-CM | POA: Diagnosis not present

## 2020-11-20 DIAGNOSIS — I480 Paroxysmal atrial fibrillation: Secondary | ICD-10-CM | POA: Diagnosis not present

## 2020-11-20 DIAGNOSIS — I5033 Acute on chronic diastolic (congestive) heart failure: Secondary | ICD-10-CM

## 2020-11-20 DIAGNOSIS — I10 Essential (primary) hypertension: Secondary | ICD-10-CM

## 2020-11-20 NOTE — Patient Instructions (Signed)
Medication Instructions:  Your physician recommends that you continue on your current medications as directed. Please refer to the Current Medication list given to you today.  *If you need a refill on your cardiac medications before your next appointment, please call your pharmacy*   Lab Work: None If you have labs (blood work) drawn today and your tests are completely normal, you will receive your results only by: Marland Kitchen MyChart Message (if you have MyChart) OR . A paper copy in the mail If you have any lab test that is abnormal or we need to change your treatment, we will call you to review the results.   Testing/Procedures: None   Follow-Up: At [facility], you and your health needs are our priority.  As part of our continuing mission to provide you with exceptional heart care, we have created designated Provider Care Teams.  These Care Teams include your primary Cardiologist (physician) and Advanced Practice Providers (APPs -  Physician Assistants and Nurse Practitioners) who all work together to provide you with the care you need, when you need it.  We recommend signing up for the patient portal called "MyChart".  Sign up information is provided on this After Visit Summary.  MyChart is used to connect with patients for Virtual Visits (Telemedicine).  Patients are able to view lab/test results, encounter notes, upcoming appointments, etc.  Non-urgent messages can be sent to your provider as well.   To learn more about what you can do with MyChart, go to [email or web address].    Your next appointment:   4 week(s)  The format for your next appointment:   In Person  Provider:   Jenne Campus, MD   Other Instructions

## 2020-11-20 NOTE — H&P (View-Only) (Signed)
Cardiology Office Note:    Date:  11/20/2020   ID:  Alexandra, [name] [birth year], MRN [id number]  PCP:  Enid Skeens., MD  Cardiologist:  Jenne Campus, MD    Referring MD: Enid Skeens., MD   Chief Complaint  Patient presents with  . Follow-up  Am doing better but still have some episode of atrial fibrillation  History of Present Illness:    [name] is a 65 y.o. female with past medical history significant for paroxysmal atrial fibrillation, she is anticoagulated, CHADS2 Vascor equals 63, cardiomyopathy which felt to be nonischemic with ejection fraction 40 to 45%, moderate mitral regurgitation that was recently evaluated by TEE, pulmonary hypertension with pulmonary pressure of 54 mmHg. She comes today 2 months of follow-up overall she seems to be doing well still described to have some palpitations on and off an episode of atrial fibrillation.  She is scheduled to have atrial fibrillation ablation done next Wednesday.  She did have TEE done with intention to recheck mitral regurgitation degree in mechanism.  Family was that this is probably partially related to LV dysfunction the key is to improve left ventricle function hopefully by doing this mitral valve incompetence will improve as well.  Grading of mitral valve regurgitation was moderate.  She denies have any shortness of breath chest pain tightness squeezing pressure burning chest no swelling of lower extremities.  Past Medical History:  Diagnosis Date  . Acquired thrombophilia ([place]) 01/29/2020  . AKI (acute kidney injury) ([place]) 11/20/2019  . Allergy 09/08/2016  . AMS (altered mental status)   . Anemia in end-stage renal disease ([place]) 07/16/2019  . Anemia, normocytic normochromic 07/27/2018  . Ankle joint pain 04/09/2018  . Arthritis    "back, knees, arms, wrists" (05/15/2013)  . Asthma   . Asymmetrical left sensorineural hearing loss 07/31/2019  . Atrial fibrillation ([place]) 11/15/2016  . Breast cancer ([place])    . C. difficile colitis   . Calculus of kidney 08/12/2012  . CHF (congestive heart failure) ([place])    "mild" (05/15/2013)  . CHF (congestive heart failure) ([place])    "mild" (05/15/2013)  . Chronic bronchitis ([place])   . Chronic lower back pain   . Chronic non-seasonal allergic rhinitis 11/15/2016  . Chronic systolic congestive heart failure ([place])   . Chronic UTI   . Cough variant asthma vs uacs  07/24/2017   Onset in her 20s some better p rx with allergy shots in her 30s Singulair trial 11/15/16 >  Improved 01/18/17:  FEV1 1.29 L (61%)  Ratio 75  Still on ACEi as of 07/24/2017  Spirometry 06/28/18   FEV1 1.3 (61%)  Ratio 77 with min curvature   - Allergy profile 07/26/2018 >  Eos 1.2 /  IgE  420  RAST Pos mold only   . Cough variant asthma vs uacs  07/24/2017   Onset in her 20s some better p rx with allergy shots in her 30s Singulair trial 11/15/16 >  Improved 01/18/17:  FEV1 1.29 L (61%)  Ratio 75  Still on ACEi as of 07/24/2017  Spirometry 06/28/18   FEV1 1.3 (61%)  Ratio 77 with min curvature   - Allergy profile 07/26/2018 >  Eos 1.2 /  IgE  420  RAST Pos mold only   . Diabetes mellitus type 2 in obese ([place])   . Diarrhea 11/15/2016  . Dilated cardiomyopathy ([place]) 07/16/2019  . DOE (dyspnea on exertion) 07/27/2018   Onset early 2019 assoc with uacs while on  ACEi - 06/28/18    Walked RA x one lap =  approx 250 ft - stopped due to sob with sats still high 90's     . Dysphagia   . Dysrhythmia    atrial fib/dr [place] [facility]  . Family history of anesthesia complication    "my mother also had PONV" (05/15/2013)  . Fever 10/29/2017  . Foot pain 04/09/2018  . GERD (gastroesophageal reflux disease)   . Heart murmur   . High cholesterol   . History of breast cancer in female 07/19/2013  . HTN (hypertension) 05/08/2013  . Hydronephrosis of right kidney   . Hyperlipidemia 11/15/2016  . Hyponatremia 10/29/2017  . IDA (iron deficiency anemia) 04/19/2017  . Infection   . Kidney stones   . Left  breast mass 07/19/2013  . Low back pain 05/01/2018  . Migraine    "haven't had one in the early 2000's" (05/15/2013)  . Mitral regurgitation 09/03/2020  . Mixed incontinence 07/24/2018  . Nephrolithiasis 11/15/2016  . Other chronic cystitis 05/19/2011  . PAF (paroxysmal atrial fibrillation) ([place])   . Personal history of chemotherapy   . Personal history of radiation therapy   . Pneumonia    "used to be chronic; last time I had it was 2013" (05/15/2013)  . PONV (postoperative nausea and vomiting)   . Recurrent sinusitis 09/08/2016  . Recurrent UTI 11/20/2014  . Recurrent UTI (urinary tract infection)    "from the continuous kidney stones; take Macrodantin qd" (05/15/2013)  . Respiratory failure requiring intubation ([place]) 05/08/2013  . Restrictive lung disease 01/18/2017  . Sacroiliac joint pain 05/16/2018  . Sepsis ([place]) 05   kidney stone infection  . Septic shock ([place]) 09/23/2018  . Simple chronic bronchitis ([place]) 11/15/2016  . Strep throat 10/29/2017  . Tachypnea   . Tension headache   . Type II diabetes mellitus ([place])   . Type II or unspecified type diabetes mellitus without mention of complication, not stated as uncontrolled 05/11/2013  . Urinary tract stones 05/19/2011  . Urinary urgency 07/24/2018    Past Surgical History:  Procedure Laterality Date  . 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 BREAST BIOPSY Left 02/2001  . BREAST LUMPECTOMY Left 02/2001  . BREAST LUMPECTOMY Left 09/23/2013   Procedure: LEFT LUMPECTOMY WITH SPECIMEN MAMMOGRAM;  Surgeon: Adin Hector, MD;  Location: [facility];  Service: General;  Laterality: Left;  . COLONOSCOPY    . DIAGNOSTIC LAPAROSCOPY     cyst-near ovary  . LITHOTRIPSY     "2-3 times prior to 2002" (05/15/2013)  . SPINAL FUSION N/A 05/08/2013   Procedure: T9-S1 INSTRUMENTED FUSION T12 -S1 DECOMPRESSION;  Surgeon: Melina Schools, MD;  Location:  [place];  Service: Orthopedics;  Laterality: N/A;  . TEE WITHOUT CARDIOVERSION N/A 11/06/2020   Procedure: TRANSESOPHAGEAL ECHOCARDIOGRAM (TEE);  Surgeon: Fay Records, MD;  Location[facility] ENDOSCOPY;  Service: Cardiovascular;  Laterality: N/A;  . TUBAL LIGATION Bilateral 1985    Current Medications: Current Meds  Medication Sig  . acetaminophen (TYLENOL) 500 MG tablet Take 500 mg by mouth every 6 (six) hours as needed for mild pain or headache.   Marland Kitchen apixaban (ELIQUIS) 5 MG TABS tablet Take 5 mg by mouth 2 (two) times daily.  . Ascorbic Acid (VITAMIN C) 1000 MG tablet Take 1,000 mg by mouth in the morning.  Marland Kitchen atorvastatin (LIPITOR) 10 MG tablet Take 10 mg by  mouth at bedtime.  . carvedilol (COREG) 6.25 MG tablet Take 6.25 mg by mouth 2 (two) times daily with a meal. Patient will take 1 tablet (6.25 mg) by mouth 3 times daily if in afib  . cholecalciferol (VITAMIN D) 25 MCG (1000 UNIT) tablet Take 1,000 Units by mouth in the morning.  . fluticasone (FLONASE) 50 MCG/ACT nasal spray Place 1 spray into both nostrils daily as needed for allergies or rhinitis.  . furosemide (LASIX) 20 MG tablet Take 20 mg by mouth daily as needed for edema (weight gain of 2 lbs or more x 2 days).  Marland Kitchen glipiZIDE (GLUCOTROL XL) 10 MG 24 hr tablet Take 10 mg by mouth in the morning and at bedtime.   Marland Kitchen glucagon (GLUCAGON EMERGENCY) 1 MG injection Inject 1 mg into the muscle as needed (When glucose drop low levels).  . loratadine (CLARITIN) 10 MG tablet Take 10 mg by mouth in the morning.  . montelukast (SINGULAIR) 10 MG tablet Take 10 mg by mouth at bedtime.  Marland Kitchen omeprazole (PRILOSEC) 20 MG capsule Take 20 mg by mouth in the morning.  Marland Kitchen OVER THE COUNTER MEDICATION Take 2 capsules by mouth 2 (two) times daily. Glucosil for blood sugar  . sertraline (ZOLOFT) 50 MG tablet Take 50 mg by mouth in the morning.  . Zinc 50 MG TABS Take 50 mg by mouth in the morning.     Allergies:   Pneumococcal vaccines, Cleocin [clindamycin hcl],  Morphine and related, Other, Ativan [lorazepam], and Buprenorphine hcl   Social History   Socioeconomic History  . Marital status: Married    Spouse name: Not on file  . Number of children: Not on file  . Years of education: Not on file  . Highest education level: Not on file  Occupational History  . Not on file  Tobacco Use  . Smoking status: Never Smoker  . Smokeless tobacco: Never Used  . Tobacco comment: Mother & Father smoked  Vaping Use  . Vaping Use: Never used  Substance and Sexual Activity  . Alcohol use: No    Alcohol/week: 0.0 standard drinks  . Drug use: No  . Sexual activity: Yes  Other Topics Concern  . Not on file  Social History Narrative   [facility] (11/15/16):   Patient's originally from [place]. Has always lived in [place]. Currently has an outside dog. Remote exposure to [place] when her children were young. No known mold exposure. 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    Social Determinants of Health   Financial Resource Strain: Not on file  Food Insecurity: Not on file  Transportation Needs: Not on file  Physical Activity: Not on file  Stress: Not on file  Social Connections: Not on file     Family History: The patient's family history includes Breast cancer in her mother; COPD in her mother; Environmental Allergies in her son and another family member; Heart disease in her mother; Kidney cancer in her father; Liver cancer in her brother; Lupus in an other family member. ROS:   Please see the history of present illness.    All 14 point review of systems negative except as described per history of present illness  EKGs/Labs/Other Studies Reviewed:      Recent Labs: 09/01/2020: Pro B Natriuretic peptide (BNP) 1,085.0 10/19/2020: ALT 21 10/26/2020: Hemoglobin 12.6; Platelets 212 11/02/2020: BUN 33; Creatinine, Ser 1.37; Potassium 4.9; Sodium 139  Recent Lipid Panel    Component Value  Date/Time   TRIG 155 (H) 05/08/2013 2023    Physical Exam:    VS:  BP 120/70 (BP Location: Right Arm, Patient Position: Sitting, Cuff Size: Normal)   Pulse 74   Ht 4\' 11"  (1.499 m)   Wt 155 lb (70.3 kg)   SpO2 97%   BMI 31.31 kg/m     Wt Readings from Last 3 Encounters:  11/20/20 155 lb (70.3 kg)  11/06/20 153 lb (69.4 kg)  10/26/20 153 lb (69.4 kg)     GEN:  Well nourished, well developed in no acute distress HEENT: Normal NECK: No JVD; No carotid bruits LYMPHATICS: No lymphadenopathy CARDIAC: RRR, holosystolic murmur grade 1/6 to 2/6 best heard at apex, no rubs, no gallops RESPIRATORY:  Clear to auscultation without rales, wheezing or rhonchi  ABDOMEN: Soft, non-tender, non-distended MUSCULOSKELETAL:  No edema; No deformity  SKIN: Warm and dry LOWER EXTREMITIES: no swelling NEUROLOGIC:  Alert and oriented x 3 PSYCHIATRIC:  Normal affect   ASSESSMENT:    1. PAF (paroxysmal atrial fibrillation) ([place])   2. Dilated cardiomyopathy ([place])   3. Primary hypertension   4. Acute on chronic diastolic congestive heart failure ([place])    PLAN:    In order of problems listed above:  1. Paroxysmal atrial fibrillation seems to maintain sinus rhythm continue anticoagulation scheduled to have ablation next Wednesday we discussed procedure again. 2. Dilated cardiomyopathy my intention was today to start with Delene Loll, she did have Chem-7 done by her primary care physician with creatinine of 1.14 with upper limits of normal of 1, I will not start Entresto right now and we are planning to do an atrial fibrillation ablation and I expect she will need to have some diet during the procedure.  I see her shortly after procedure and then I will start her on this medication in the meantime we will continue with Coreg 6.25.  She is hemodynamically compensated with small dose of diuretics. 3. Congestive heart failure compensated during physical exam.   Medication Adjustments/Labs and Tests  Ordered: Current medicines are reviewed at length with the patient today.  Concerns regarding medicines are outlined above.  No orders of the defined types were placed in this encounter.  Medication changes: No orders of the defined types were placed in this encounter.   Signed, Park Liter, MD, [facility] 11/20/2020 11:32 AM    [place]

## 2020-11-20 NOTE — Progress Notes (Signed)
Cardiology Office Note:    Date:  11/20/2020   ID:  Kaleah, Hagemeister 1956-04-14, MRN [id number]  PCP:  Enid Skeens., MD  Cardiologist:  Jenne Campus, MD    Referring MD: Enid Skeens., MD   Chief Complaint  Patient presents with  . Follow-up  Am doing better but still have some episode of atrial fibrillation  History of Present Illness:    LARRI BREWTON is a 65 y.o. female with past medical history significant for paroxysmal atrial fibrillation, she is anticoagulated, CHADS2 Vascor equals 16, cardiomyopathy which felt to be nonischemic with ejection fraction 40 to 45%, moderate mitral regurgitation that was recently evaluated by TEE, pulmonary hypertension with pulmonary pressure of 54 mmHg. She comes today 2 months of follow-up overall she seems to be doing well still described to have some palpitations on and off an episode of atrial fibrillation.  She is scheduled to have atrial fibrillation ablation done next Wednesday.  She did have TEE done with intention to recheck mitral regurgitation degree in mechanism.  Family was that this is probably partially related to LV dysfunction the key is to improve left ventricle function hopefully by doing this mitral valve incompetence will improve as well.  Grading of mitral valve regurgitation was moderate.  She denies have any shortness of breath chest pain tightness squeezing pressure burning chest no swelling of lower extremities.  Past Medical History:  Diagnosis Date  . Acquired thrombophilia ([place]) 01/29/2020  . AKI (acute kidney injury) ([place]) 11/20/2019  . Allergy 09/08/2016  . AMS (altered mental status)   . Anemia in end-stage renal disease ([place]) 07/16/2019  . Anemia, normocytic normochromic 07/27/2018  . Ankle joint pain 04/09/2018  . Arthritis    "back, knees, arms, wrists" (05/15/2013)  . Asthma   . Asymmetrical left sensorineural hearing loss 07/31/2019  . Atrial fibrillation ([place]) 11/15/2016  . Breast cancer ([place])    . C. difficile colitis   . Calculus of kidney 08/12/2012  . CHF (congestive heart failure) ([place])    "mild" (05/15/2013)  . CHF (congestive heart failure) ([place])    "mild" (05/15/2013)  . Chronic bronchitis ([place])   . Chronic lower back pain   . Chronic non-seasonal allergic rhinitis 11/15/2016  . Chronic systolic congestive heart failure ([place])   . Chronic UTI   . Cough variant asthma vs uacs  07/24/2017   Onset in her 20s some better p rx with allergy shots in her 30s Singulair trial 11/15/16 >  Improved 01/18/17:  FEV1 1.29 L (61%)  Ratio 75  Still on ACEi as of 07/24/2017  Spirometry 06/28/18   FEV1 1.3 (61%)  Ratio 77 with min curvature   - Allergy profile 07/26/2018 >  Eos 1.2 /  IgE  420  RAST Pos mold only   . Cough variant asthma vs uacs  07/24/2017   Onset in her 20s some better p rx with allergy shots in her 30s Singulair trial 11/15/16 >  Improved 01/18/17:  FEV1 1.29 L (61%)  Ratio 75  Still on ACEi as of 07/24/2017  Spirometry 06/28/18   FEV1 1.3 (61%)  Ratio 77 with min curvature   - Allergy profile 07/26/2018 >  Eos 1.2 /  IgE  420  RAST Pos mold only   . Diabetes mellitus type 2 in obese ([place])   . Diarrhea 11/15/2016  . Dilated cardiomyopathy ([place]) 07/16/2019  . DOE (dyspnea on exertion) 07/27/2018   Onset early 2019 assoc with uacs while on  ACEi - 06/28/18    Walked RA x one lap =  approx 250 ft - stopped due to sob with sats still high 90's     . Dysphagia   . Dysrhythmia    atrial fib/dr [facility]  . Family history of anesthesia complication    "my mother also had PONV" (05/15/2013)  . Fever 10/29/2017  . Foot pain 04/09/2018  . GERD (gastroesophageal reflux disease)   . Heart murmur   . High cholesterol   . History of breast cancer in female 07/19/2013  . HTN (hypertension) 05/08/2013  . Hydronephrosis of right kidney   . Hyperlipidemia 11/15/2016  . Hyponatremia 10/29/2017  . IDA (iron deficiency anemia) 04/19/2017  . Infection   . Kidney stones   . Left  breast mass 07/19/2013  . Low back pain 05/01/2018  . Migraine    "haven't had one in the early 2000's" (05/15/2013)  . Mitral regurgitation 09/03/2020  . Mixed incontinence 07/24/2018  . Nephrolithiasis 11/15/2016  . Other chronic cystitis 05/19/2011  . PAF (paroxysmal atrial fibrillation) ([place])   . Personal history of chemotherapy   . Personal history of radiation therapy   . Pneumonia    "used to be chronic; last time I had it was 2013" (05/15/2013)  . PONV (postoperative nausea and vomiting)   . Recurrent sinusitis 09/08/2016  . Recurrent UTI 11/20/2014  . Recurrent UTI (urinary tract infection)    "from the continuous kidney stones; take Macrodantin qd" (05/15/2013)  . Respiratory failure requiring intubation ([place]) 05/08/2013  . Restrictive lung disease 01/18/2017  . Sacroiliac joint pain 05/16/2018  . Sepsis ([place]) 05   kidney stone infection  . Septic shock ([place]) 09/23/2018  . Simple chronic bronchitis ([place]) 11/15/2016  . Strep throat 10/29/2017  . Tachypnea   . Tension headache   . Type II diabetes mellitus ([place])   . Type II or unspecified type diabetes mellitus without mention of complication, not stated as uncontrolled 05/11/2013  . Urinary tract stones 05/19/2011  . Urinary urgency 07/24/2018    Past Surgical History:  Procedure Laterality Date  . 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 BREAST BIOPSY Left 02/2001  . BREAST LUMPECTOMY Left 02/2001  . BREAST LUMPECTOMY Left 09/23/2013   Procedure: LEFT LUMPECTOMY WITH SPECIMEN MAMMOGRAM;  Surgeon: Adin Hector, MD;  Location: [facility];  Service: General;  Laterality: Left;  . COLONOSCOPY    . DIAGNOSTIC LAPAROSCOPY     cyst-near ovary  . LITHOTRIPSY     "2-3 times prior to 2002" (05/15/2013)  . SPINAL FUSION N/A 05/08/2013   Procedure: T9-S1 INSTRUMENTED FUSION T12 -S1 DECOMPRESSION;  Surgeon: Melina Schools, MD;  Location:  [facility];  Service: Orthopedics;  Laterality: N/A;  . TEE WITHOUT CARDIOVERSION N/A 11/06/2020   Procedure: TRANSESOPHAGEAL ECHOCARDIOGRAM (TEE);  Surgeon: Fay Records, MD;  Location[facility] ENDOSCOPY;  Service: Cardiovascular;  Laterality: N/A;  . TUBAL LIGATION Bilateral 1985    Current Medications: Current Meds  Medication Sig  . acetaminophen (TYLENOL) 500 MG tablet Take 500 mg by mouth every 6 (six) hours as needed for mild pain or headache.   Marland Kitchen apixaban (ELIQUIS) 5 MG TABS tablet Take 5 mg by mouth 2 (two) times daily.  . Ascorbic Acid (VITAMIN C) 1000 MG tablet Take 1,000 mg by mouth in the morning.  Marland Kitchen atorvastatin (LIPITOR) 10 MG tablet Take 10 mg by  mouth at bedtime.  . carvedilol (COREG) 6.25 MG tablet Take 6.25 mg by mouth 2 (two) times daily with a meal. Patient will take 1 tablet (6.25 mg) by mouth 3 times daily if in afib  . cholecalciferol (VITAMIN D) 25 MCG (1000 UNIT) tablet Take 1,000 Units by mouth in the morning.  . fluticasone (FLONASE) 50 MCG/ACT nasal spray Place 1 spray into both nostrils daily as needed for allergies or rhinitis.  . furosemide (LASIX) 20 MG tablet Take 20 mg by mouth daily as needed for edema (weight gain of 2 lbs or more x 2 days).  Marland Kitchen glipiZIDE (GLUCOTROL XL) 10 MG 24 hr tablet Take 10 mg by mouth in the morning and at bedtime.   Marland Kitchen glucagon (GLUCAGON EMERGENCY) 1 MG injection Inject 1 mg into the muscle as needed (When glucose drop low levels).  . loratadine (CLARITIN) 10 MG tablet Take 10 mg by mouth in the morning.  . montelukast (SINGULAIR) 10 MG tablet Take 10 mg by mouth at bedtime.  Marland Kitchen omeprazole (PRILOSEC) 20 MG capsule Take 20 mg by mouth in the morning.  Marland Kitchen OVER THE COUNTER MEDICATION Take 2 capsules by mouth 2 (two) times daily. Glucosil for blood sugar  . sertraline (ZOLOFT) 50 MG tablet Take 50 mg by mouth in the morning.  . Zinc 50 MG TABS Take 50 mg by mouth in the morning.     Allergies:   Pneumococcal vaccines, Cleocin [clindamycin hcl],  Morphine and related, Other, Ativan [lorazepam], and Buprenorphine hcl   Social History   Socioeconomic History  . Marital status: Married    Spouse name: Not on file  . Number of children: Not on file  . Years of education: Not on file  . Highest education level: Not on file  Occupational History  . Not on file  Tobacco Use  . Smoking status: Never Smoker  . Smokeless tobacco: Never Used  . Tobacco comment: Mother & Father smoked  Vaping Use  . Vaping Use: Never used  Substance and Sexual Activity  . Alcohol use: No    Alcohol/week: 0.0 standard drinks  . Drug use: No  . Sexual activity: Yes  Other Topics Concern  . Not on file  Social History Narrative   [facility] (11/15/16):   Patient's originally from [place]. Has always lived in [place]. Currently has an outside dog. Remote exposure to [place] when her children were young. No known mold exposure. 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    Social Determinants of Health   Financial Resource Strain: Not on file  Food Insecurity: Not on file  Transportation Needs: Not on file  Physical Activity: Not on file  Stress: Not on file  Social Connections: Not on file     Family History: The patient's family history includes Breast cancer in her mother; COPD in her mother; Environmental Allergies in her son and another family member; Heart disease in her mother; Kidney cancer in her father; Liver cancer in her brother; Lupus in an other family member. ROS:   Please see the history of present illness.    All 14 point review of systems negative except as described per history of present illness  EKGs/Labs/Other Studies Reviewed:      Recent Labs: 09/01/2020: Pro B Natriuretic peptide (BNP) 1,085.0 10/19/2020: ALT 21 10/26/2020: Hemoglobin 12.6; Platelets 212 11/02/2020: BUN 33; Creatinine, Ser 1.37; Potassium 4.9; Sodium 139  Recent Lipid Panel    Component Value  Date/Time   TRIG 155 (H) 05/08/2013 2023    Physical Exam:    VS:  BP 120/70 (BP Location: Right Arm, Patient Position: Sitting, Cuff Size: Normal)   Pulse 74   Ht 4\' 11"  (1.499 m)   Wt 155 lb (70.3 kg)   SpO2 97%   BMI 31.31 kg/m     Wt Readings from Last 3 Encounters:  11/20/20 155 lb (70.3 kg)  11/06/20 153 lb (69.4 kg)  10/26/20 153 lb (69.4 kg)     GEN:  Well nourished, well developed in no acute distress HEENT: Normal NECK: No JVD; No carotid bruits LYMPHATICS: No lymphadenopathy CARDIAC: RRR, holosystolic murmur grade 1/6 to 2/6 best heard at apex, no rubs, no gallops RESPIRATORY:  Clear to auscultation without rales, wheezing or rhonchi  ABDOMEN: Soft, non-tender, non-distended MUSCULOSKELETAL:  No edema; No deformity  SKIN: Warm and dry LOWER EXTREMITIES: no swelling NEUROLOGIC:  Alert and oriented x 3 PSYCHIATRIC:  Normal affect   ASSESSMENT:    1. PAF (paroxysmal atrial fibrillation) ([place])   2. Dilated cardiomyopathy ([place])   3. Primary hypertension   4. Acute on chronic diastolic congestive heart failure ([place])    PLAN:    In order of problems listed above:  1. Paroxysmal atrial fibrillation seems to maintain sinus rhythm continue anticoagulation scheduled to have ablation next Wednesday we discussed procedure again. 2. Dilated cardiomyopathy my intention was today to start with Delene Loll, she did have Chem-7 done by her primary care physician with creatinine of 1.14 with upper limits of normal of 1, I will not start Entresto right now and we are planning to do an atrial fibrillation ablation and I expect she will need to have some diet during the procedure.  I see her shortly after procedure and then I will start her on this medication in the meantime we will continue with Coreg 6.25.  She is hemodynamically compensated with small dose of diuretics. 3. Congestive heart failure compensated during physical exam.   Medication Adjustments/Labs and Tests  Ordered: Current medicines are reviewed at length with the patient today.  Concerns regarding medicines are outlined above.  No orders of the defined types were placed in this encounter.  Medication changes: No orders of the defined types were placed in this encounter.   Signed, Park Liter, MD, [facility] 11/20/2020 11:32 AM    [place]

## 2020-11-21 ENCOUNTER — Ambulatory Visit
Admission: RE | Admit: 2020-11-21 | Discharge: 2020-11-21 | Disposition: A | Discharge status: Home / Self Care | Payer: Medicare Other | Source: Ambulatory Visit | Attending: Family | Admitting: Family

## 2020-11-21 DIAGNOSIS — N644 Mastodynia: Secondary | ICD-10-CM

## 2020-11-21 DIAGNOSIS — M7989 Other specified soft tissue disorders: Secondary | ICD-10-CM

## 2020-11-21 DIAGNOSIS — C50011 Malignant neoplasm of nipple and areola, right female breast: Secondary | ICD-10-CM

## 2020-11-23 ENCOUNTER — Other Ambulatory Visit (HOSPITAL_COMMUNITY): Payer: Federal, State, Local not specified - PPO

## 2020-11-23 ENCOUNTER — Other Ambulatory Visit (HOSPITAL_COMMUNITY)
Admission: RE | Admit: 2020-11-23 | Discharge: 2020-11-23 | Disposition: A | Discharge status: Home / Self Care | Payer: Medicare Other | Source: Ambulatory Visit | Attending: Cardiovascular Disease | Admitting: Cardiovascular Disease

## 2020-11-23 DIAGNOSIS — Z20822 Contact with and (suspected) exposure to covid-19: Secondary | ICD-10-CM | POA: Diagnosis not present

## 2020-11-23 DIAGNOSIS — Z01812 Encounter for preprocedural laboratory examination: Secondary | ICD-10-CM | POA: Diagnosis present

## 2020-11-23 LAB — SARS CORONAVIRUS 2 (TAT 6-24 HRS): SARS Coronavirus 2: NEGATIVE

## 2020-11-24 ENCOUNTER — Ambulatory Visit (HOSPITAL_BASED_OUTPATIENT_CLINIC_OR_DEPARTMENT_OTHER)
Admission: RE | Admit: 2020-11-24 | Discharge: 2020-11-24 | Disposition: A | Discharge status: Home / Self Care | Payer: Medicare Other | Source: Ambulatory Visit | Attending: Cardiovascular Disease | Admitting: Cardiovascular Disease

## 2020-11-24 ENCOUNTER — Ambulatory Visit (HOSPITAL_COMMUNITY)
Admission: RE | Admit: 2020-11-24 | Discharge: 2020-11-24 | Disposition: A | Discharge status: Home / Self Care | Payer: Medicare Other | Source: Ambulatory Visit | Attending: Cardiovascular Disease | Admitting: Cardiovascular Disease

## 2020-11-24 ENCOUNTER — Ambulatory Visit (HOSPITAL_COMMUNITY): Payer: Medicare Other | Admitting: Anesthesiology

## 2020-11-24 ENCOUNTER — Encounter (HOSPITAL_COMMUNITY)
Admission: RE | Disposition: A | Discharge status: Home / Self Care | Payer: Self-pay | Source: Ambulatory Visit | Attending: Cardiovascular Disease

## 2020-11-24 ENCOUNTER — Encounter (HOSPITAL_COMMUNITY): Payer: Self-pay | Admitting: Cardiovascular Disease

## 2020-11-24 DIAGNOSIS — Z7984 Long term (current) use of oral hypoglycemic drugs: Secondary | ICD-10-CM | POA: Diagnosis not present

## 2020-11-24 DIAGNOSIS — I420 Dilated cardiomyopathy: Secondary | ICD-10-CM | POA: Diagnosis not present

## 2020-11-24 DIAGNOSIS — I5033 Acute on chronic diastolic (congestive) heart failure: Secondary | ICD-10-CM | POA: Diagnosis not present

## 2020-11-24 DIAGNOSIS — Z885 Allergy status to narcotic agent status: Secondary | ICD-10-CM | POA: Diagnosis not present

## 2020-11-24 DIAGNOSIS — I4891 Unspecified atrial fibrillation: Secondary | ICD-10-CM | POA: Insufficient documentation

## 2020-11-24 DIAGNOSIS — I480 Paroxysmal atrial fibrillation: Secondary | ICD-10-CM | POA: Diagnosis present

## 2020-11-24 DIAGNOSIS — I700 Atherosclerosis of aorta: Secondary | ICD-10-CM | POA: Diagnosis not present

## 2020-11-24 DIAGNOSIS — Z79899 Other long term (current) drug therapy: Secondary | ICD-10-CM | POA: Insufficient documentation

## 2020-11-24 DIAGNOSIS — Z881 Allergy status to other antibiotic agents status: Secondary | ICD-10-CM | POA: Diagnosis not present

## 2020-11-24 DIAGNOSIS — I110 Hypertensive heart disease with heart failure: Secondary | ICD-10-CM | POA: Insufficient documentation

## 2020-11-24 DIAGNOSIS — I340 Nonrheumatic mitral (valve) insufficiency: Secondary | ICD-10-CM | POA: Diagnosis not present

## 2020-11-24 DIAGNOSIS — Z887 Allergy status to serum and vaccine status: Secondary | ICD-10-CM | POA: Insufficient documentation

## 2020-11-24 DIAGNOSIS — Z7901 Long term (current) use of anticoagulants: Secondary | ICD-10-CM | POA: Diagnosis not present

## 2020-11-24 DIAGNOSIS — Z888 Allergy status to other drugs, medicaments and biological substances status: Secondary | ICD-10-CM | POA: Insufficient documentation

## 2020-11-24 HISTORY — PX: TEE WITHOUT CARDIOVERSION: SHX5443

## 2020-11-24 HISTORY — PX: BUBBLE STUDY: SHX6837

## 2020-11-24 LAB — GLUCOSE, CAPILLARY: Glucose-Capillary: 242 mg/dL — ABNORMAL HIGH (ref 70–99)

## 2020-11-24 SURGERY — ECHOCARDIOGRAM, TRANSESOPHAGEAL
Anesthesia: Monitor Anesthesia Care

## 2020-11-24 MED ORDER — PROPOFOL 500 MG/50ML IV EMUL
INTRAVENOUS | Status: DC | PRN
  Administered 2020-11-24: 75 ug/kg/min via INTRAVENOUS

## 2020-11-24 MED ORDER — SODIUM CHLORIDE 0.9 % IV SOLN
INTRAVENOUS | Status: DC
  Administered 2020-11-24: 500 mL via INTRAVENOUS

## 2020-11-24 MED ORDER — PROPOFOL 10 MG/ML IV BOLUS
INTRAVENOUS | Status: DC | PRN
  Administered 2020-11-24 (×2): 20 mg via INTRAVENOUS

## 2020-11-24 MED ORDER — BUTAMBEN-TETRACAINE-BENZOCAINE 2-2-14 % EX AERO
INHALATION_SPRAY | CUTANEOUS | Status: DC | PRN
  Administered 2020-11-24: 2 via TOPICAL

## 2020-11-24 NOTE — Pre-Procedure Instructions (Signed)
Attempted to call patient regarding procedure instructions for tomorrow.  Left voice on the following items Arrival time 0630 Nothing to eat or drink after midnight No meds AM of procedure Responsible person to drive you home and stay with you for 24 hrs  Have you missed any doses of anti-coagulant Eliquis- take both doses today, none in the morning

## 2020-11-24 NOTE — Anesthesia Preprocedure Evaluation (Signed)
Anesthesia Evaluation  Patient identified by MRN, date of birth, ID band Patient awake    Reviewed: Allergy & Precautions, H&P , NPO status , Patient's Chart, lab work & pertinent test results  History of Anesthesia Complications (+) PONV and history of anesthetic complications  Airway Mallampati: II   Neck ROM: full    Dental   Pulmonary asthma ,    breath sounds clear to auscultation       Cardiovascular hypertension, +CHF  + dysrhythmias Atrial Fibrillation  Rhythm:regular Rate:Normal     Neuro/Psych  Headaches,    GI/Hepatic GERD  ,  Endo/Other  diabetes, Type 2  Renal/GU stones     Musculoskeletal  (+) Arthritis ,   Abdominal   Peds  Hematology   Anesthesia Other Findings   Reproductive/Obstetrics                             Anesthesia Physical Anesthesia Plan  ASA: III  Anesthesia Plan: MAC   Post-op Pain Management:    Induction: Intravenous  PONV Risk Score and Plan: 3 and Treatment may vary due to age or medical condition and Propofol infusion  Airway Management Planned: Nasal Cannula  Additional Equipment:   Intra-op Plan:   Post-operative Plan:   Informed Consent: I have reviewed the patients History and Physical, chart, labs and discussed the procedure including the risks, benefits and alternatives for the proposed anesthesia with the patient or authorized representative who has indicated his/her understanding and acceptance.     Dental advisory given  Plan Discussed with: CRNA, Anesthesiologist and Surgeon  Anesthesia Plan Comments:         Anesthesia Quick Evaluation

## 2020-11-24 NOTE — Interval H&P Note (Signed)
History and Physical Interval Note:  11/24/2020 10:43 AM  [name]  has presented today for surgery, with the diagnosis of pre-ablation.  The various methods of treatment have been discussed with the patient and family. After consideration of risks, benefits and other options for treatment, the patient has consented to  Procedure(s): TRANSESOPHAGEAL ECHOCARDIOGRAM (TEE) (N/A) as a surgical intervention.  The patient's history has been reviewed, patient examined, no change in status, stable for surgery.  I have reviewed the patient's chart and labs.  Questions were answered to the patient's satisfaction.    TEE for pre-ablation. NPO.  [place] T. Audie Box, MD, [facility]  [address], [place], [place] [address] [phone]  10:44 AM

## 2020-11-24 NOTE — Discharge Instructions (Signed)

## 2020-11-24 NOTE — Transfer of Care (Signed)
Immediate Anesthesia Transfer of Care Note  Patient: [name]  Procedure(s) Performed: TRANSESOPHAGEAL ECHOCARDIOGRAM (TEE) (N/A ) BUBBLE STUDY  Patient Location: Endoscopy Unit  Anesthesia Type:MAC  Level of Consciousness: oriented, drowsy and patient cooperative  Airway & Oxygen Therapy: Patient Spontanous Breathing and Patient connected to nasal cannula oxygen  Post-op Assessment: Report given to RN and Post -op Vital signs reviewed and stable  Post vital signs: Reviewed  Last Vitals:  Vitals Value Taken Time  BP 149/66 11/24/20 1253  Temp 36.6 C 11/24/20 1253  Pulse 75 11/24/20 1255  Resp 14 11/24/20 1255  SpO2 100 % 11/24/20 1255  Vitals shown include unvalidated device data.  Last Pain:  Vitals:   11/24/20 1253  TempSrc: Oral  PainSc: 0-No pain         Complications: No complications documented.

## 2020-11-24 NOTE — CV Procedure (Signed)
    TRANSESOPHAGEAL ECHOCARDIOGRAM   NAME:  [name]    MRN: [id number] DOB:  June 10, 1956    ADMIT DATE: 11/24/2020  INDICATIONS: Afib Ablation  PROCEDURE:   Informed consent was obtained prior to the procedure. The risks, benefits and alternatives for the procedure were discussed and the patient comprehended these risks.  Risks include, but are not limited to, cough, sore throat, vomiting, nausea, somnolence, esophageal and stomach trauma or perforation, bleeding, low blood pressure, aspiration, pneumonia, infection, trauma to the teeth and death.    Procedural time out performed. The oropharynx was anesthetized with topical 1% benzocaine.    Anesthesia was administered by Dr. Denton Brick.  The patient was administered 125 mg of propofol and 0 mg of lidocaine to achieve and maintain moderate conscious sedation.  The patient's heart rate, blood pressure, and oxygen saturation are monitored continuously during the procedure. The period of conscious sedation is 25 minutes, of which I was present face-to-face 100% of this time.   The transesophageal probe was inserted in the esophagus and stomach without difficulty and multiple views were obtained.   COMPLICATIONS:    There were no immediate complications.  KEY FINDINGS:  1. No LAA thrombus.  2. Negative bubble study.  3. Normal LV/RV function.  4. Full report to follow. 5. Further management per primary team.   Addison Naegeli. Audie Box, MD, [place]  [address] [employer] [place], [place] [address] [phone]  12:45 PM

## 2020-11-24 NOTE — Anesthesia Procedure Notes (Signed)
Procedure Name: MAC Date/Time: 11/24/2020 12:31 PM Performed by: Jenne Campus, CRNA Pre-anesthesia Checklist: Patient identified, Emergency Drugs available, Suction available, Patient being monitored and Timeout performed Oxygen Delivery Method: Nasal cannula

## 2020-11-25 ENCOUNTER — Ambulatory Visit (HOSPITAL_COMMUNITY): Payer: Medicare Other | Admitting: Certified Registered Nurse Anesthetist

## 2020-11-25 ENCOUNTER — Encounter (HOSPITAL_COMMUNITY)
Admission: RE | Disposition: A | Discharge status: Home / Self Care | Payer: Self-pay | Source: Home / Self Care | Attending: Cardiology

## 2020-11-25 ENCOUNTER — Other Ambulatory Visit: Payer: Self-pay

## 2020-11-25 ENCOUNTER — Ambulatory Visit (HOSPITAL_COMMUNITY)
Admission: RE | Admit: 2020-11-25 | Discharge: 2020-11-25 | Disposition: A | Discharge status: Home / Self Care | Payer: Medicare Other | Attending: Cardiology | Admitting: Cardiology

## 2020-11-25 DIAGNOSIS — Z881 Allergy status to other antibiotic agents status: Secondary | ICD-10-CM | POA: Insufficient documentation

## 2020-11-25 DIAGNOSIS — Z853 Personal history of malignant neoplasm of breast: Secondary | ICD-10-CM | POA: Diagnosis not present

## 2020-11-25 DIAGNOSIS — I480 Paroxysmal atrial fibrillation: Secondary | ICD-10-CM | POA: Diagnosis present

## 2020-11-25 DIAGNOSIS — Z803 Family history of malignant neoplasm of breast: Secondary | ICD-10-CM | POA: Insufficient documentation

## 2020-11-25 DIAGNOSIS — E1122 Type 2 diabetes mellitus with diabetic chronic kidney disease: Secondary | ICD-10-CM | POA: Insufficient documentation

## 2020-11-25 DIAGNOSIS — N186 End stage renal disease: Secondary | ICD-10-CM | POA: Diagnosis not present

## 2020-11-25 DIAGNOSIS — Z800 Family history of malignant neoplasm of digestive organs: Secondary | ICD-10-CM | POA: Diagnosis not present

## 2020-11-25 DIAGNOSIS — Z885 Allergy status to narcotic agent status: Secondary | ICD-10-CM | POA: Diagnosis not present

## 2020-11-25 DIAGNOSIS — Z887 Allergy status to serum and vaccine status: Secondary | ICD-10-CM | POA: Insufficient documentation

## 2020-11-25 DIAGNOSIS — N179 Acute kidney failure, unspecified: Secondary | ICD-10-CM | POA: Diagnosis not present

## 2020-11-25 DIAGNOSIS — Z981 Arthrodesis status: Secondary | ICD-10-CM | POA: Insufficient documentation

## 2020-11-25 DIAGNOSIS — I340 Nonrheumatic mitral (valve) insufficiency: Secondary | ICD-10-CM | POA: Diagnosis not present

## 2020-11-25 DIAGNOSIS — I5022 Chronic systolic (congestive) heart failure: Secondary | ICD-10-CM | POA: Insufficient documentation

## 2020-11-25 DIAGNOSIS — Z8249 Family history of ischemic heart disease and other diseases of the circulatory system: Secondary | ICD-10-CM | POA: Diagnosis not present

## 2020-11-25 DIAGNOSIS — E785 Hyperlipidemia, unspecified: Secondary | ICD-10-CM | POA: Insufficient documentation

## 2020-11-25 DIAGNOSIS — I132 Hypertensive heart and chronic kidney disease with heart failure and with stage 5 chronic kidney disease, or end stage renal disease: Secondary | ICD-10-CM | POA: Insufficient documentation

## 2020-11-25 DIAGNOSIS — Z888 Allergy status to other drugs, medicaments and biological substances status: Secondary | ICD-10-CM | POA: Insufficient documentation

## 2020-11-25 DIAGNOSIS — Z8619 Personal history of other infectious and parasitic diseases: Secondary | ICD-10-CM | POA: Insufficient documentation

## 2020-11-25 DIAGNOSIS — Z8051 Family history of malignant neoplasm of kidney: Secondary | ICD-10-CM | POA: Insufficient documentation

## 2020-11-25 DIAGNOSIS — D631 Anemia in chronic kidney disease: Secondary | ICD-10-CM | POA: Insufficient documentation

## 2020-11-25 DIAGNOSIS — Z90722 Acquired absence of ovaries, bilateral: Secondary | ICD-10-CM | POA: Diagnosis not present

## 2020-11-25 HISTORY — PX: ATRIAL FIBRILLATION ABLATION: EP1191

## 2020-11-25 LAB — GLUCOSE, CAPILLARY
Glucose-Capillary: 237 mg/dL — ABNORMAL HIGH (ref 70–99)
Glucose-Capillary: 314 mg/dL — ABNORMAL HIGH (ref 70–99)
Glucose-Capillary: 347 mg/dL — ABNORMAL HIGH (ref 70–99)

## 2020-11-25 LAB — POCT ACTIVATED CLOTTING TIME
Activated Clotting Time: 309 seconds
Activated Clotting Time: 315 seconds

## 2020-11-25 SURGERY — ATRIAL FIBRILLATION ABLATION
Anesthesia: General

## 2020-11-25 MED ORDER — SODIUM CHLORIDE 0.9% FLUSH: 3.0000 mL | INTRAVENOUS | Status: DC | PRN

## 2020-11-25 MED ORDER — HEPARIN SODIUM (PORCINE) 1000 UNIT/ML IJ SOLN
INTRAMUSCULAR | Status: DC | PRN
  Administered 2020-11-25 (×2): 2000 [IU] via INTRAVENOUS
  Administered 2020-11-25: 14000 [IU] via INTRAVENOUS

## 2020-11-25 MED ORDER — DOBUTAMINE IN D5W 4-5 MG/ML-% IV SOLN
INTRAVENOUS | Status: AC
  Filled 2020-11-25: qty 250

## 2020-11-25 MED ORDER — INSULIN ASPART 100 UNIT/ML IJ SOLN
INTRAMUSCULAR | Status: AC
  Administered 2020-11-25: 11 [IU]
  Filled 2020-11-25: qty 1

## 2020-11-25 MED ORDER — ONDANSETRON HCL 4 MG/2ML IJ SOLN
4.0000 mg | Freq: Four times a day (QID) | INTRAMUSCULAR | Status: DC | PRN
  Administered 2020-11-25: 4 mg via INTRAVENOUS

## 2020-11-25 MED ORDER — FENTANYL CITRATE (PF) 100 MCG/2ML IJ SOLN
25.0000 ug | INTRAMUSCULAR | Status: DC | PRN
  Administered 2020-11-25: 50 ug via INTRAVENOUS
  Filled 2020-11-25: qty 2

## 2020-11-25 MED ORDER — DEXAMETHASONE SODIUM PHOSPHATE 10 MG/ML IJ SOLN
INTRAMUSCULAR | Status: DC | PRN
  Administered 2020-11-25: 5 mg via INTRAVENOUS

## 2020-11-25 MED ORDER — PHENYLEPHRINE 40 MCG/ML (10ML) SYRINGE FOR IV PUSH (FOR BLOOD PRESSURE SUPPORT)
PREFILLED_SYRINGE | INTRAVENOUS | Status: DC | PRN
  Administered 2020-11-25: 80 ug via INTRAVENOUS
  Administered 2020-11-25: 40 ug via INTRAVENOUS

## 2020-11-25 MED ORDER — HEPARIN (PORCINE) IN NACL 1000-0.9 UT/500ML-% IV SOLN
INTRAVENOUS | Status: AC
  Filled 2020-11-25: qty 500

## 2020-11-25 MED ORDER — HEPARIN SODIUM (PORCINE) 1000 UNIT/ML IJ SOLN
INTRAMUSCULAR | Status: DC | PRN
  Administered 2020-11-25: 1000 [IU] via INTRAVENOUS

## 2020-11-25 MED ORDER — DOBUTAMINE IN D5W 4-5 MG/ML-% IV SOLN
INTRAVENOUS | Status: DC | PRN
  Administered 2020-11-25: 20 ug/kg/min via INTRAVENOUS

## 2020-11-25 MED ORDER — SODIUM CHLORIDE 0.9 % IV SOLN: 250.0000 mL | INTRAVENOUS | Status: DC | PRN

## 2020-11-25 MED ORDER — HEPARIN (PORCINE) IN NACL 1000-0.9 UT/500ML-% IV SOLN
INTRAVENOUS | Status: DC | PRN
  Administered 2020-11-25 (×5): 500 mL

## 2020-11-25 MED ORDER — SUGAMMADEX SODIUM 200 MG/2ML IV SOLN
INTRAVENOUS | Status: DC | PRN
  Administered 2020-11-25: 200 mg via INTRAVENOUS

## 2020-11-25 MED ORDER — FENTANYL CITRATE (PF) 100 MCG/2ML IJ SOLN
25.0000 ug | Freq: Once | INTRAMUSCULAR | Status: AC
Start: 2020-11-25 — End: 2020-11-25
  Administered 2020-11-25: 25 ug via INTRAVENOUS

## 2020-11-25 MED ORDER — FENTANYL CITRATE (PF) 250 MCG/5ML IJ SOLN
INTRAMUSCULAR | Status: DC | PRN
  Administered 2020-11-25: 50 ug via INTRAVENOUS

## 2020-11-25 MED ORDER — FENTANYL CITRATE (PF) 100 MCG/2ML IJ SOLN
INTRAMUSCULAR | Status: AC
  Filled 2020-11-25: qty 2

## 2020-11-25 MED ORDER — MIDAZOLAM HCL 5 MG/5ML IJ SOLN
INTRAMUSCULAR | Status: DC | PRN
  Administered 2020-11-25 (×2): 1 mg via INTRAVENOUS

## 2020-11-25 MED ORDER — PROPOFOL 10 MG/ML IV BOLUS
INTRAVENOUS | Status: DC | PRN
  Administered 2020-11-25: 130 mg via INTRAVENOUS

## 2020-11-25 MED ORDER — ONDANSETRON HCL 4 MG/2ML IJ SOLN
INTRAMUSCULAR | Status: AC
  Filled 2020-11-25: qty 2

## 2020-11-25 MED ORDER — SODIUM CHLORIDE 0.9 % IV SOLN: INTRAVENOUS | Status: DC

## 2020-11-25 MED ORDER — ONDANSETRON HCL 4 MG/2ML IJ SOLN
INTRAMUSCULAR | Status: DC | PRN
  Administered 2020-11-25: 4 mg via INTRAVENOUS

## 2020-11-25 MED ORDER — LIDOCAINE 2% (20 MG/ML) 5 ML SYRINGE
INTRAMUSCULAR | Status: DC | PRN
  Administered 2020-11-25: 80 mg via INTRAVENOUS

## 2020-11-25 MED ORDER — ACETAMINOPHEN 500 MG PO TABS
1000.0000 mg | ORAL_TABLET | Freq: Once | ORAL | Status: AC
  Administered 2020-11-25: 1000 mg via ORAL
  Filled 2020-11-25: qty 2

## 2020-11-25 MED ORDER — SODIUM CHLORIDE 0.9% FLUSH: 3.0000 mL | Freq: Two times a day (BID) | INTRAVENOUS | Status: DC

## 2020-11-25 MED ORDER — HEPARIN SODIUM (PORCINE) 1000 UNIT/ML IJ SOLN
INTRAMUSCULAR | Status: AC
  Filled 2020-11-25: qty 1

## 2020-11-25 MED ORDER — PROTAMINE SULFATE 10 MG/ML IV SOLN
INTRAVENOUS | Status: DC | PRN
  Administered 2020-11-25: 40 mg via INTRAVENOUS
  Administered 2020-11-25: 30 mg via INTRAVENOUS

## 2020-11-25 MED ORDER — ACETAMINOPHEN 325 MG PO TABS: 650.0000 mg | ORAL_TABLET | ORAL | Status: DC | PRN

## 2020-11-25 MED ORDER — INSULIN ASPART 100 UNIT/ML IJ SOLN: 0.0000 [IU] | Freq: Three times a day (TID) | INTRAMUSCULAR | Status: DC

## 2020-11-25 MED ORDER — DIPHENHYDRAMINE HCL 50 MG/ML IJ SOLN
INTRAMUSCULAR | Status: AC
  Filled 2020-11-25: qty 1

## 2020-11-25 MED ORDER — ROCURONIUM BROMIDE 100 MG/10ML IV SOLN
INTRAVENOUS | Status: DC | PRN
  Administered 2020-11-25: 60 mg via INTRAVENOUS

## 2020-11-25 MED ORDER — DIPHENHYDRAMINE HCL 50 MG/ML IJ SOLN
INTRAMUSCULAR | Status: DC | PRN
  Administered 2020-11-25: 6.25 mg via INTRAVENOUS

## 2020-11-25 SURGICAL SUPPLY — 21 items
BAG SNAP BAND KOVER 36X36 (MISCELLANEOUS) ×1 IMPLANT
BLANKET WARM UNDERBOD FULL ACC (MISCELLANEOUS) ×2 IMPLANT
CATH 8FR REPROCESSED SOUNDSTAR (CATHETERS) ×2 IMPLANT
CATH 8FR SOUNDSTAR REPROCESSED (CATHETERS) IMPLANT
CATH MAPPNG PENTARAY F 2-6-2MM (CATHETERS) IMPLANT
CATH S CIRCA THERM PROBE 10F (CATHETERS) ×1 IMPLANT
CATH SMTCH THERMOCOOL SF DF (CATHETERS) ×1 IMPLANT
CATH WEB BI DIR CSDF CRV REPRO (CATHETERS) ×1 IMPLANT
CLOSURE PERCLOSE PROSTYLE (VASCULAR PRODUCTS) ×4 IMPLANT
COVER SWIFTLINK CONNECTOR (BAG) ×2 IMPLANT
KIT VERSACROSS STEERABLE D1 (CATHETERS) ×1 IMPLANT
PACK EP LATEX FREE (CUSTOM PROCEDURE TRAY) ×2
PACK EP LF (CUSTOM PROCEDURE TRAY) ×1 IMPLANT
PAD PRO RADIOLUCENT 2001M-C (PAD) ×2 IMPLANT
PATCH CARTO3 (PAD) ×1 IMPLANT
PENTARAY F 2-6-2MM (CATHETERS) ×2
SHEATH CARTO VIZIGO SM CVD (SHEATH) ×1 IMPLANT
SHEATH PINNACLE 7F 10CM (SHEATH) ×1 IMPLANT
SHEATH PINNACLE 8F 10CM (SHEATH) ×2 IMPLANT
SHEATH PINNACLE 9F 10CM (SHEATH) ×1 IMPLANT
TUBING SMART ABLATE COOLFLOW (TUBING) ×1 IMPLANT

## 2020-11-25 NOTE — Anesthesia Postprocedure Evaluation (Signed)
Anesthesia Post Note  Patient: [name]  Procedure(s) Performed: ATRIAL FIBRILLATION ABLATION (N/A )     Patient location during evaluation: PACU Anesthesia Type: General Level of consciousness: awake and alert Pain management: pain level controlled Vital Signs Assessment: post-procedure vital signs reviewed and stable Respiratory status: spontaneous breathing, nonlabored ventilation, respiratory function stable and patient connected to nasal cannula oxygen Cardiovascular status: blood pressure returned to baseline and stable Postop Assessment: no apparent nausea or vomiting Anesthetic complications: no   No complications documented.  Last Vitals:  Vitals:   11/25/20 1330 11/25/20 1345  BP: (!) 162/79   Pulse: 78 78  Resp: 18 18  Temp:    SpO2: 100% 99%    Last Pain:  Vitals:   11/25/20 1156  TempSrc:   PainSc: 3                  Haislee Corso P Valjean Ruppel

## 2020-11-25 NOTE — Anesthesia Procedure Notes (Signed)
Procedure Name: Intubation Date/Time: 11/25/2020 8:44 AM Performed by: Janene Harvey, CRNA Pre-anesthesia Checklist: Patient identified, Emergency Drugs available, Suction available and Patient being monitored Patient Re-evaluated:Patient Re-evaluated prior to induction Oxygen Delivery Method: Circle system utilized Preoxygenation: Pre-oxygenation with 100% oxygen Induction Type: IV induction Ventilation: Mask ventilation without difficulty and Oral airway inserted - appropriate to patient size Laryngoscope Size: Mac and 4 Grade View: Grade II Tube type: Oral Tube size: 7.0 mm Number of attempts: 1 Airway Equipment and Method: Stylet and Oral airway Placement Confirmation: ETT inserted through vocal cords under direct vision,  positive ETCO2 and breath sounds checked- equal and bilateral Secured at: 21 cm Tube secured with: Tape Dental Injury: Teeth and Oropharynx as per pre-operative assessment

## 2020-11-25 NOTE — Anesthesia Postprocedure Evaluation (Signed)
Anesthesia Post Note  Patient: [name]  Procedure(s) Performed: TRANSESOPHAGEAL ECHOCARDIOGRAM (TEE) (N/A ) BUBBLE STUDY     Patient location during evaluation: Endoscopy Anesthesia Type: MAC Level of consciousness: awake and alert Pain management: pain level controlled Vital Signs Assessment: post-procedure vital signs reviewed and stable Respiratory status: spontaneous breathing, nonlabored ventilation, respiratory function stable and patient connected to nasal cannula oxygen Cardiovascular status: stable and blood pressure returned to baseline Postop Assessment: no apparent nausea or vomiting Anesthetic complications: no   No complications documented.  Last Vitals:  Vitals:   11/24/20 1310 11/24/20 1317  BP:  (!) 170/86  Pulse: 79   Resp: (!) 21   Temp:    SpO2: 100%     Last Pain:  Vitals:   11/24/20 1253  TempSrc: Oral  PainSc: 0-No pain                 Alin Chavira S

## 2020-11-25 NOTE — H&P (Signed)
Electrophysiology Office Note   Date:  11/25/2020   ID:  [name], Alexandra Feb 19, 1956, MRN [id number]  PCP:  Enid Skeens., MD  Cardiologist:  Agustin Cree Primary Electrophysiologist:  Serenah Mill [name], MD    Chief Complaint: AF   History of Present Illness: [name] is a 65 y.o. female who is being seen today for the evaluation of AF at the request of [name] found. Presenting today for electrophysiology evaluation.  She has a history significant for atrial fibrillation, cardiomyopathy with a normalized ejection fraction, diastolic dysfunction, mitral prolapse with mild to moderate mitral regurgitation, diabetes, hypertension, hyperlipidemia, breast cancer.  Today, denies symptoms of palpitations, chest pain, shortness of breath, orthopnea, PND, lower extremity edema, claudication, dizziness, presyncope, syncope, bleeding, or neurologic sequela. The patient is tolerating medications without difficulties. Plan AF ablation.    Past Medical History:  Diagnosis Date  . Acquired thrombophilia ([place]) 01/29/2020  . AKI (acute kidney injury) ([place]) 11/20/2019  . Allergy 09/08/2016  . AMS (altered mental status)   . 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 Anemia, normocytic normochromic 07/27/2018  . Ankle joint pain 04/09/2018  . Arthritis    "back, knees, arms, wrists" (05/15/2013)  . Asthma   . Asymmetrical left sensorineural hearing loss 07/31/2019  . Atrial fibrillation ([name]) 11/15/2016  . Breast cancer ([place])   . C. difficile colitis   . Calculus of kidney 08/12/2012  . CHF (congestive heart failure) ([place])    "mild" (05/15/2013)  . CHF (congestive heart failure) ([place])    "mild" (05/15/2013)  . Chronic bronchitis ([place])   . Chronic lower back pain   . Chronic non-seasonal allergic rhinitis 11/15/2016  . Chronic systolic congestive heart failure ([place])   . Chronic UTI   . Cough variant asthma vs uacs  07/24/2017   Onset in her 20s some better p rx  with allergy shots in her 30s Singulair trial 11/15/16 >  Improved 01/18/17:  FEV1 1.29 L (61%)  Ratio 75  Still on ACEi as of 07/24/2017  Spirometry 06/28/18   FEV1 1.3 (61%)  Ratio 77 with min curvature   - Allergy profile 07/26/2018 >  Eos 1.2 /  IgE  420  RAST Pos mold only   . Cough variant asthma vs uacs  07/24/2017   Onset in her 20s some better p rx with allergy shots in her 30s Singulair trial 11/15/16 >  Improved 01/18/17:  FEV1 1.29 L (61%)  Ratio 75  Still on ACEi as of 07/24/2017  Spirometry 06/28/18   FEV1 1.3 (61%)  Ratio 77 with min curvature   - Allergy profile 07/26/2018 >  Eos 1.2 /  IgE  420  RAST Pos mold only   . Diabetes mellitus type 2 in obese ([place])   . Diarrhea 11/15/2016  . Dilated cardiomyopathy ([place]) 07/16/2019  . DOE (dyspnea on exertion) 07/27/2018   Onset early 2019 assoc with uacs while on ACEi - 06/28/18    Walked RA x one lap =  approx 250 ft - stopped due to sob with sats still high 90's     . Dysphagia   . Dysrhythmia    atrial fib/dr [name]  . Family history of anesthesia complication    "my mother also had PONV" (05/15/2013)  . Fever 10/29/2017  . Foot pain 04/09/2018  . GERD (gastroesophageal reflux disease)   . Heart murmur   . High cholesterol   . History of breast cancer in  female 07/19/2013  . HTN (hypertension) 05/08/2013  . Hydronephrosis of right kidney   . Hyperlipidemia 11/15/2016  . Hyponatremia 10/29/2017  . IDA (iron deficiency anemia) 04/19/2017  . Infection   . Kidney stones   . Left breast mass 07/19/2013  . Low back pain 05/01/2018  . Migraine    "haven't had one in the early 2000's" (05/15/2013)  . Mitral regurgitation 09/03/2020  . Mixed incontinence 07/24/2018  . Nephrolithiasis 11/15/2016  . Other chronic cystitis 05/19/2011  . PAF (paroxysmal atrial fibrillation) ([name])   . Personal history of chemotherapy   . Personal history of radiation therapy   . Pneumonia    "used to be chronic; last time I had it was 2013"  (05/15/2013)  . PONV (postoperative nausea and vomiting)   . Recurrent sinusitis 09/08/2016  . Recurrent UTI 11/20/2014  . Recurrent UTI (urinary tract infection)    "from the continuous kidney stones; take Macrodantin qd" (05/15/2013)  . Respiratory failure requiring intubation ([name]) 05/08/2013  . Restrictive lung disease 01/18/2017  . Sacroiliac joint pain 05/16/2018  . Sepsis ([place]) 05   kidney stone infection  . Septic shock ([name]) 09/23/2018  . Simple chronic bronchitis ([place]) 11/15/2016  . Strep throat 10/29/2017  . Tachypnea   . Tension headache   . Type II diabetes mellitus ([place])   . Type II or unspecified type diabetes mellitus without mention of complication, not stated as uncontrolled 05/11/2013  . Urinary tract stones 05/19/2011  . Urinary urgency 07/24/2018   Past Surgical History:  Procedure Laterality Date  . 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 BREAST BIOPSY Left 02/2001  . BREAST LUMPECTOMY Left 02/2001  . BREAST LUMPECTOMY Left 09/23/2013   Procedure: LEFT LUMPECTOMY WITH SPECIMEN MAMMOGRAM;  Surgeon: Adin Hector, MD;  Location: [facility];  Service: General;  Laterality: Left;  . BUBBLE STUDY  11/24/2020   Procedure: BUBBLE STUDY;  Surgeon: Alexandra Rile, MD;  Location: [facility];  Service: Cardiovascular;;  . COLONOSCOPY    . DIAGNOSTIC LAPAROSCOPY     cyst-near ovary  . LITHOTRIPSY     "2-3 times prior to 2002" (05/15/2013)  . SPINAL FUSION N/A 05/08/2013   Procedure: T9-S1 INSTRUMENTED FUSION T12 -S1 DECOMPRESSION;  Surgeon: Melina Schools, MD;  Location: [name];  Service: Orthopedics;  Laterality: N/A;  . TEE WITHOUT CARDIOVERSION N/A 11/06/2020   Procedure: TRANSESOPHAGEAL ECHOCARDIOGRAM (TEE);  Surgeon: Fay Records, MD;  Location: [name];  Service: Cardiovascular;  Laterality: N/A;  . TEE WITHOUT CARDIOVERSION N/A 11/24/2020   Procedure:  TRANSESOPHAGEAL ECHOCARDIOGRAM (TEE);  Surgeon: Alexandra Rile, MD;  Location: [facility];  Service: Cardiovascular;  Laterality: N/A;  . TUBAL LIGATION Bilateral 1985     Current Facility-Administered Medications  Medication Dose Route Frequency [name] Last Rate Last Admin  . 0.9 %  sodium chloride infusion   Intravenous Continuous Laquesha Holcomb Hassell Done, MD      . acetaminophen (TYLENOL) tablet 1,000 mg  1,000 mg Oral Once Catalina Gravel, MD        Allergies:   Pneumococcal vaccines, Cleocin [clindamycin hcl], Morphine and related, Other, Ativan [lorazepam], and Buprenorphine hcl   Social History:  The patient  reports that she has never smoked. She has never used smokeless tobacco. She reports that she does not drink alcohol and does not use drugs.   Family History:  The patient's family history includes  Breast cancer in her mother; COPD in her mother; Environmental Allergies in her son and another family member; Heart disease in her mother; Kidney cancer in her father; Liver cancer in her brother; Lupus in an other family member.    ROS:  Please see the history of present illness.   Otherwise, review of systems is positive for none.   All other systems are reviewed and negative.   PHYSICAL EXAM: VS:  BP (!) 145/82   Pulse 73   Temp 98.4 F (36.9 C) (Oral)   Ht 4\' 11"  (1.499 m)   Wt 69.4 kg   SpO2 100%   BMI 30.90 kg/m  , BMI Body mass index is 30.9 kg/m. GEN: Well nourished, well developed, in Alexandra acute distress  HEENT: normal  Neck: Alexandra JVD, carotid bruits, or masses Cardiac: RRR; Alexandra murmurs, rubs, or gallops,Alexandra edema  Respiratory:  clear to auscultation bilaterally, normal work of breathing GI: soft, nontender, nondistended, + BS MS: Alexandra deformity or atrophy  Skin: warm and dry Neuro:  Strength and sensation are intact Psych: euthymic mood, full affect  Recent Labs: 09/01/2020: Pro B Natriuretic peptide (BNP) 1,085.0 10/19/2020: ALT 21 10/26/2020: Hemoglobin  12.6; Platelets 212 11/02/2020: BUN 33; Creatinine, Ser 1.37; Potassium 4.9; Sodium 139    Lipid Panel     Component Value Date/Time   TRIG 155 (H) 05/08/2013 2023     Wt Readings from Last 3 Encounters:  11/25/20 69.4 kg  11/20/20 70.3 kg  11/06/20 69.4 kg      Other studies Reviewed: Additional studies/ records that were reviewed today include: TTE 01/13/20  Review of the above records today demonstrates:  1. Left ventricular ejection fraction, by estimation, is 60 to 65%. The  left ventricle has normal function. The left ventricle has Alexandra regional  wall motion abnormalities. There is severe left ventricular hypertrophy of  the septal segment. Left  ventricular diastolic parameters are consistent with Grade II diastolic  dysfunction (pseudonormalization).  2. Right ventricular systolic function is normal. The right ventricular  size is normal. There is normal pulmonary artery systolic pressure.  3. There is mild to moderate mitral annular calcification. Mild anterior  leaflet prolapse. Mild to moderate posterior directed mitral valve  regurgitation. Alexandra evidence of mitral stenosis.  4. Tricuspid valve regurgitation is moderate.  5. Suspect a unicuspid valve. Aortic valve regurgitation is not  visualized. Alexandra aortic stenosis is present.  6. The inferior vena cava is normal in size with greater than 50%  respiratory variability, suggesting right atrial pressure of 3 mmHg.    ASSESSMENT AND PLAN:  1.  Paroxysmal atrial fibrillation: SUMIRE HALBLEIB has presented today for surgery, with the diagnosis of atrial fibrillation.  The various methods of treatment have been discussed with the patient and family. After consideration of risks, benefits and other options for treatment, the patient has consented to  Procedure(s): Catheter ablation as a surgical intervention .  Risks include but not limited to complete heart block, stroke, esophageal damage, nerve damage, bleeding,  vascular damage, tamponade, perforation, MI, and death. The patient's history has been reviewed, patient examined, Alexandra change in status, stable for surgery.  I have reviewed the patient's chart and labs.  Questions were answered to the patient's satisfaction.    Skyeler Smola Curt Bears, MD 11/25/2020 7:00 AM

## 2020-11-25 NOTE — Transfer of Care (Signed)
Immediate Anesthesia Transfer of Care Note  Patient: [name]  Procedure(s) Performed: ATRIAL FIBRILLATION ABLATION (N/A )  Patient Location: Cath Lab  Anesthesia Type:General  Level of Consciousness: drowsy and patient cooperative  Airway & Oxygen Therapy: Patient Spontanous Breathing and Patient connected to nasal cannula oxygen  Post-op Assessment: Report given to RN and Post -op Vital signs reviewed and stable  Post vital signs: Reviewed  Last Vitals:  Vitals Value Taken Time  BP 119/81 11/25/20 1059  Temp    Pulse 71 11/25/20 1101  Resp 14 11/25/20 1101  SpO2 100 % 11/25/20 1101  Vitals shown include unvalidated device data.  Last Pain:  Vitals:   11/25/20 0707  TempSrc:   PainSc: 0-No pain      Patients Stated Pain Goal: 3 (74/14/23 9532)  Complications: No complications documented.

## 2020-11-25 NOTE — Anesthesia Preprocedure Evaluation (Addendum)
Anesthesia Evaluation  Patient identified by MRN, date of birth, ID band Patient awake    Reviewed: Allergy & Precautions, NPO status , Patient's Chart, lab work & pertinent test results  History of Anesthesia Complications (+) PONV, Family history of anesthesia reaction and history of anesthetic complications  Airway Mallampati: III  TM Distance: >3 FB Neck ROM: Full    Dental  (+) Teeth Intact, Dental Advisory Given   Pulmonary asthma ,    Pulmonary exam normal breath sounds clear to auscultation       Cardiovascular hypertension, +CHF  + dysrhythmias + Valvular Problems/Murmurs MR  Rhythm:Irregular Rate:Abnormal  Echo 11/06/20:  1. Left ventricular ejection fraction, by estimation, is 40 to 45%. The  left ventricle has mildly decreased function.  2. Right ventricular systolic function is normal. The right ventricular  size is normal.  3. No left atrial/left atrial appendage thrombus was detected.  4. Prominent PFO with channel of L to R flow seen by color doppler .  Evidence of atrial level shunting detected by color flow Doppler. There is  a patent foramen ovale with predominantly left to right shunting across  the atrial septum.  5. The posterior leaflet of the mitral valve is restricted in motion.  There are few jets of MR. Overall during this exam, MR appears moderate.  6. Tricuspid valve regurgitation is mild to moderate.  7. The aortic valve is normal in structure. Aortic valve regurgitation is  not visualized.  8. There is mild (Grade II) plaque.   Neuro/Psych  Headaches, negative psych ROS   GI/Hepatic Neg liver ROS, GERD  ,  Endo/Other  diabetes, Type 2Obesity   Renal/GU Renal disease     Musculoskeletal  (+) Arthritis ,   Abdominal   Peds  Hematology negative hematology ROS (+)   Anesthesia Other Findings Day of surgery medications reviewed with the patient.  Reproductive/Obstetrics                             Anesthesia Physical Anesthesia Plan  ASA: III  Anesthesia Plan: General   Post-op Pain Management:    Induction: Intravenous  PONV Risk Score and Plan: 4 or greater and Midazolam, Diphenhydramine, Dexamethasone and Ondansetron  Airway Management Planned: Oral ETT  Additional Equipment:   Intra-op Plan:   Post-operative Plan: Extubation in OR  Informed Consent: I have reviewed the patients History and Physical, chart, labs and discussed the procedure including the risks, benefits and alternatives for the proposed anesthesia with the patient or authorized representative who has indicated his/her understanding and acceptance.     Dental advisory given  Plan Discussed with: CRNA  Anesthesia Plan Comments:         Anesthesia Quick Evaluation

## 2020-11-25 NOTE — Discharge Instructions (Signed)
Femoral Site Care  This sheet gives you information about how to care for yourself after your procedure. Your health care provider may also give you more specific instructions. If you have problems or questions, contact your health care provider. What can I expect after the procedure? After the procedure, it is common to have:  Bruising that usually fades within 1-2 weeks.  Tenderness at the site. Follow these instructions at home: Wound care  Follow instructions from your health care provider about how to take care of your insertion site. Make sure you: ? Wash your hands with soap and water before you change your bandage (dressing). If soap and water are not available, use hand sanitizer. ? Change your dressing as told by your health care provider. ? Leave stitches (sutures), skin glue, or adhesive strips in place. These skin closures may need to stay in place for 2 weeks or longer. If adhesive strip edges start to loosen and curl up, you may trim the loose edges. Do not remove adhesive strips completely unless your health care provider tells you to do that.  Do not take baths, swim, or use a hot tub until your health care provider approves.  You may shower 24-48 hours after the procedure or as told by your health care provider. ? Gently wash the site with plain soap and water. ? Pat the area dry with a clean towel. ? Do not rub the site. This may cause bleeding.  Do not apply powder or lotion to the site. Keep the site clean and dry.  Check your femoral site every day for signs of infection. Check for: ? Redness, swelling, or pain. ? Fluid or blood. ? Warmth. ? Pus or a bad smell. Activity  For the first 2-3 days after your procedure, or as long as directed: ? Avoid climbing stairs as much as possible. ? Do not squat.  Do not lift anything that is heavier than 10 lb (4.5 kg), or the limit that you are told, until your health care provider says that it is safe.  Rest as  directed. ? Avoid sitting for a long time without moving. Get up to take short walks every 1-2 hours.  Do not drive for 24 hours if you were given a medicine to help you relax (sedative). General instructions  Take over-the-counter and prescription medicines only as told by your health care provider.  Keep all follow-up visits as told by your health care provider. This is important. Contact a health care provider if you have:  A fever or chills.  You have redness, swelling, or pain around your insertion site. Get help right away if:  The catheter insertion area swells very fast.  You pass out.  You suddenly start to sweat or your skin gets clammy.  The catheter insertion area is bleeding, and the bleeding does not stop when you hold steady pressure on the area.  The area near or just beyond the catheter insertion site becomes pale, cool, tingly, or numb. These symptoms may represent a serious problem that is an emergency. Do not wait to see if the symptoms will go away. Get medical help right away. Call your local emergency services (911 in the [place] Do not drive yourself to the hospital. Summary  After the procedure, it is common to have bruising that usually fades within 1-2 weeks.  Check your femoral site every day for signs of infection.  Do not lift anything that is heavier than 10 lb (4.5 kg), or   the limit that you are told, until your health care provider says that it is safe. This information is not intended to replace advice given to you by your health care provider. Make sure you discuss any questions you have with your health care provider. Document Revised: 02/21/2020 Document Reviewed: 02/21/2020 Elsevier Patient Education  2021 [employer]. Post procedure care instructions No driving for 4 days. No lifting over 5 lbs for 1 week. No vigorous or sexual activity for 1 week. You may return to work/your usual activities on 12/03/20. Keep procedure site clean & dry. If  you notice increased pain, swelling, bleeding or pus, call/return!  You may shower after 24 hours, but no soaking in baths/hot tubs/pools for 1 week.    You have an appointment set up with the [facility].  Multiple studies have shown that being followed by a dedicated atrial fibrillation clinic in addition to the standard care you receive from your other physicians improves health. We believe that enrollment in the atrial fibrillation clinic will allow us to better care for you.   The phone number to the At[facility]ibrillation Clinic is [phone]. The clinic is staffed Monday through Friday from 8:30am to 5pm.  Parking Directions: The clinic is located in the Heart and Vascular Building connected to [facility]. 1)From [address] turn on to [address] and go to the 3rd entrance  (Heart and Vascular entrance) on the right. 2)Look to the right for Heart &Vascular Parking Garage. 3)A code for the entrance is required, for June is 4233.   4)Take the elevators to the 1st floor. Registration is in the room with the glass walls at the end of the hallway.  If you have any trouble parking or locating the clinic, please don't hesitate to call [phone]. 

## 2020-11-25 NOTE — Progress Notes (Signed)
Patient stated she was in 10/10 left rib pain. Writer paged Dr. Curt Bears. Verbal consent to give 32mcg Fentanyl once for pain. Orders followed and placed. Patient's CBG was 314, gave 11 units insulin. Rechecked CBG 347. No further orders given. Will continue to monitor.

## 2020-11-26 ENCOUNTER — Encounter (HOSPITAL_COMMUNITY): Payer: Self-pay | Admitting: Cardiology

## 2020-12-01 ENCOUNTER — Other Ambulatory Visit: Payer: Medicare Other

## 2020-12-02 ENCOUNTER — Ambulatory Visit: Payer: Federal, State, Local not specified - PPO | Admitting: Cardiology

## 2020-12-02 ENCOUNTER — Telehealth: Payer: Self-pay | Admitting: Cardiology

## 2020-12-02 NOTE — Telephone Encounter (Signed)
Called and spoke to patient. She reports that last Friday she began running a fever and becoming short of breath on exertion. She still has a low grade fever today. No chest pain, no congestion, no cough. Still short of breath on exertion. She saw her pcp yesterday and they are treating her for a uti. No palpitations but heart rate 100-104. She did have a 3 lb weight gain yesterday and she took 20 mg of lasix for this but she has still not lost the weight. No swelling. She has not been tested for covid either. Will check with DOD for further instruction since Dr. Agustin Cree is rounding this morning.

## 2020-12-02 NOTE — Telephone Encounter (Signed)
I think with a fever and shortness of breath is not related to heart disease however I think she likely needs to be seen in the office this week I think you can wait till Dr. Agustin Cree gets to the office and see when he wants to get her in.

## 2020-12-02 NOTE — Telephone Encounter (Signed)
Pt c/o Shortness Of Breath: STAT if SOB developed within the last 24 hours or pt is noticeably SOB on the phone  1. Are you currently SOB (can you hear that pt is SOB on the phone)? A little  2. How long have you been experiencing SOB? The weekend  3. Are you SOB when sitting or when up moving around? More when moving around  4. Are you currently experiencing any other symptoms?  Heart races some times.

## 2020-12-03 NOTE — Telephone Encounter (Signed)
Spoke to patient informed her to see pcp regarding this she understood no further questions.

## 2020-12-05 ENCOUNTER — Inpatient Hospital Stay (HOSPITAL_COMMUNITY)
Admission: EM | Admit: 2020-12-05 | Discharge: 2020-12-08 | DRG: 393 | Disposition: A | Discharge status: Home / Self Care | Payer: Medicare Other | Attending: Internal Medicine | Admitting: Internal Medicine

## 2020-12-05 ENCOUNTER — Emergency Department (HOSPITAL_COMMUNITY): Payer: Medicare Other

## 2020-12-05 ENCOUNTER — Other Ambulatory Visit: Payer: Self-pay

## 2020-12-05 ENCOUNTER — Encounter (HOSPITAL_COMMUNITY): Payer: Self-pay | Admitting: Emergency Medicine

## 2020-12-05 DIAGNOSIS — N3020 Other chronic cystitis without hematuria: Secondary | ICD-10-CM | POA: Diagnosis present

## 2020-12-05 DIAGNOSIS — I509 Heart failure, unspecified: Secondary | ICD-10-CM | POA: Diagnosis not present

## 2020-12-05 DIAGNOSIS — R58 Hemorrhage, not elsewhere classified: Secondary | ICD-10-CM | POA: Diagnosis present

## 2020-12-05 DIAGNOSIS — N179 Acute kidney failure, unspecified: Secondary | ICD-10-CM | POA: Diagnosis present

## 2020-12-05 DIAGNOSIS — I4819 Other persistent atrial fibrillation: Secondary | ICD-10-CM | POA: Diagnosis present

## 2020-12-05 DIAGNOSIS — N9489 Other specified conditions associated with female genital organs and menstrual cycle: Secondary | ICD-10-CM | POA: Diagnosis present

## 2020-12-05 DIAGNOSIS — E669 Obesity, unspecified: Secondary | ICD-10-CM | POA: Diagnosis present

## 2020-12-05 DIAGNOSIS — I482 Chronic atrial fibrillation, unspecified: Secondary | ICD-10-CM

## 2020-12-05 DIAGNOSIS — E1122 Type 2 diabetes mellitus with diabetic chronic kidney disease: Secondary | ICD-10-CM | POA: Diagnosis present

## 2020-12-05 DIAGNOSIS — Z825 Family history of asthma and other chronic lower respiratory diseases: Secondary | ICD-10-CM

## 2020-12-05 DIAGNOSIS — I340 Nonrheumatic mitral (valve) insufficiency: Secondary | ICD-10-CM | POA: Diagnosis present

## 2020-12-05 DIAGNOSIS — Z8051 Family history of malignant neoplasm of kidney: Secondary | ICD-10-CM

## 2020-12-05 DIAGNOSIS — I132 Hypertensive heart and chronic kidney disease with heart failure and with stage 5 chronic kidney disease, or end stage renal disease: Secondary | ICD-10-CM | POA: Diagnosis present

## 2020-12-05 DIAGNOSIS — Z803 Family history of malignant neoplasm of breast: Secondary | ICD-10-CM

## 2020-12-05 DIAGNOSIS — Z887 Allergy status to serum and vaccine status: Secondary | ICD-10-CM

## 2020-12-05 DIAGNOSIS — E785 Hyperlipidemia, unspecified: Secondary | ICD-10-CM | POA: Diagnosis present

## 2020-12-05 DIAGNOSIS — Z800 Family history of malignant neoplasm of digestive organs: Secondary | ICD-10-CM

## 2020-12-05 DIAGNOSIS — I5022 Chronic systolic (congestive) heart failure: Secondary | ICD-10-CM | POA: Diagnosis present

## 2020-12-05 DIAGNOSIS — I4891 Unspecified atrial fibrillation: Secondary | ICD-10-CM | POA: Diagnosis not present

## 2020-12-05 DIAGNOSIS — Z7901 Long term (current) use of anticoagulants: Secondary | ICD-10-CM

## 2020-12-05 DIAGNOSIS — D631 Anemia in chronic kidney disease: Secondary | ICD-10-CM | POA: Diagnosis present

## 2020-12-05 DIAGNOSIS — J45909 Unspecified asthma, uncomplicated: Secondary | ICD-10-CM | POA: Diagnosis present

## 2020-12-05 DIAGNOSIS — I10 Essential (primary) hypertension: Secondary | ICD-10-CM | POA: Diagnosis present

## 2020-12-05 DIAGNOSIS — K683 Retroperitoneal hematoma: Secondary | ICD-10-CM | POA: Diagnosis present

## 2020-12-05 DIAGNOSIS — N186 End stage renal disease: Secondary | ICD-10-CM | POA: Diagnosis present

## 2020-12-05 DIAGNOSIS — Z8249 Family history of ischemic heart disease and other diseases of the circulatory system: Secondary | ICD-10-CM | POA: Diagnosis not present

## 2020-12-05 DIAGNOSIS — Z923 Personal history of irradiation: Secondary | ICD-10-CM

## 2020-12-05 DIAGNOSIS — I420 Dilated cardiomyopathy: Secondary | ICD-10-CM | POA: Diagnosis present

## 2020-12-05 DIAGNOSIS — Z7984 Long term (current) use of oral hypoglycemic drugs: Secondary | ICD-10-CM

## 2020-12-05 DIAGNOSIS — K219 Gastro-esophageal reflux disease without esophagitis: Secondary | ICD-10-CM | POA: Diagnosis present

## 2020-12-05 DIAGNOSIS — Z885 Allergy status to narcotic agent status: Secondary | ICD-10-CM

## 2020-12-05 DIAGNOSIS — G43909 Migraine, unspecified, not intractable, without status migrainosus: Secondary | ICD-10-CM | POA: Diagnosis present

## 2020-12-05 DIAGNOSIS — M96841 Postprocedural hematoma of a musculoskeletal structure following other procedure: Secondary | ICD-10-CM | POA: Diagnosis not present

## 2020-12-05 DIAGNOSIS — Z9221 Personal history of antineoplastic chemotherapy: Secondary | ICD-10-CM

## 2020-12-05 DIAGNOSIS — K661 Hemoperitoneum: Secondary | ICD-10-CM | POA: Diagnosis present

## 2020-12-05 DIAGNOSIS — Z20822 Contact with and (suspected) exposure to covid-19: Secondary | ICD-10-CM | POA: Diagnosis present

## 2020-12-05 DIAGNOSIS — Z87440 Personal history of urinary (tract) infections: Secondary | ICD-10-CM

## 2020-12-05 DIAGNOSIS — Z91013 Allergy to seafood: Secondary | ICD-10-CM

## 2020-12-05 DIAGNOSIS — Z79899 Other long term (current) drug therapy: Secondary | ICD-10-CM

## 2020-12-05 DIAGNOSIS — Z888 Allergy status to other drugs, medicaments and biological substances status: Secondary | ICD-10-CM

## 2020-12-05 DIAGNOSIS — E1169 Type 2 diabetes mellitus with other specified complication: Secondary | ICD-10-CM | POA: Diagnosis present

## 2020-12-05 HISTORY — DX: Hemorrhage, not elsewhere classified: R58

## 2020-12-05 LAB — CBC
HCT: 25.9 % — ABNORMAL LOW (ref 36.0–46.0)
Hemoglobin: 7.9 g/dL — ABNORMAL LOW (ref 12.0–15.0)
MCH: 31.3 pg (ref 26.0–34.0)
MCHC: 30.5 g/dL (ref 30.0–36.0)
MCV: 102.8 fL — ABNORMAL HIGH (ref 80.0–100.0)
Platelets: 353 10*3/uL (ref 150–400)
RBC: 2.52 MIL/uL — ABNORMAL LOW (ref 3.87–5.11)
RDW: 17.8 % — ABNORMAL HIGH (ref 11.5–15.5)
WBC: 11.5 10*3/uL — ABNORMAL HIGH (ref 4.0–10.5)
nRBC: 0.2 % (ref 0.0–0.2)

## 2020-12-05 LAB — BASIC METABOLIC PANEL
Anion gap: 9 (ref 5–15)
BUN: 22 mg/dL (ref 8–23)
CO2: 19 mmol/L — ABNORMAL LOW (ref 22–32)
Calcium: 8.4 mg/dL — ABNORMAL LOW (ref 8.9–10.3)
Chloride: 108 mmol/L (ref 98–111)
Creatinine, Ser: 1.47 mg/dL — ABNORMAL HIGH (ref 0.44–1.00)
GFR, Estimated: 39 mL/min — ABNORMAL LOW (ref >60)
Glucose, Bld: 260 mg/dL — ABNORMAL HIGH (ref 70–99)
Potassium: 4.4 mmol/L (ref 3.5–5.1)
Sodium: 136 mmol/L (ref 135–145)

## 2020-12-05 LAB — HEMOGLOBIN AND HEMATOCRIT, BLOOD
HCT: 25.3 % — ABNORMAL LOW (ref 36.0–46.0)
Hemoglobin: 7.8 g/dL — ABNORMAL LOW (ref 12.0–15.0)

## 2020-12-05 LAB — PREPARE RBC (CROSSMATCH)

## 2020-12-05 LAB — MAGNESIUM: Magnesium: 1.1 mg/dL — ABNORMAL LOW (ref 1.7–2.4)

## 2020-12-05 LAB — POC OCCULT BLOOD, ED: Fecal Occult Bld: NEGATIVE

## 2020-12-05 LAB — SARS CORONAVIRUS 2 (TAT 6-24 HRS): SARS Coronavirus 2: NEGATIVE

## 2020-12-05 MED ORDER — SODIUM CHLORIDE 0.9% IV SOLUTION: Freq: Once | INTRAVENOUS | Status: DC

## 2020-12-05 MED ORDER — INSULIN ASPART 100 UNIT/ML IJ SOLN
0.0000 [IU] | Freq: Every day | INTRAMUSCULAR | Status: DC
  Administered 2020-12-06: 4 [IU] via SUBCUTANEOUS

## 2020-12-05 MED ORDER — INSULIN ASPART 100 UNIT/ML IJ SOLN
0.0000 [IU] | Freq: Three times a day (TID) | INTRAMUSCULAR | Status: DC
  Administered 2020-12-06: 5 [IU] via SUBCUTANEOUS
  Administered 2020-12-06: 2 [IU] via SUBCUTANEOUS
  Administered 2020-12-06: 3 [IU] via SUBCUTANEOUS
  Administered 2020-12-07 (×2): 8 [IU] via SUBCUTANEOUS
  Administered 2020-12-07: 5 [IU] via SUBCUTANEOUS
  Administered 2020-12-08: 8 [IU] via SUBCUTANEOUS

## 2020-12-05 MED ORDER — MAGNESIUM SULFATE 2 GM/50ML IV SOLN
2.0000 g | Freq: Once | INTRAVENOUS | Status: AC
  Administered 2020-12-05: 2 g via INTRAVENOUS
  Filled 2020-12-05: qty 50

## 2020-12-05 NOTE — ED Triage Notes (Signed)
Pt to triage via [facility] from [place].  Reports generalized weakness since this morning  Ablation on 5/25.  Afib 90-150 per EMS.  NS 500cc given PTA.  CBG 327.  22g RFA.  Denies pain.  Reports SOB with exertion.

## 2020-12-05 NOTE — Consult Note (Signed)
Referring Physician: [place]  Patient name: [name] MRN: [id number] DOB: [birth year] Sex: female  REASON FOR CONSULT: retroperitoneal hematoma  HPI: [name] is a 65 y.o. female, who is 10 days status post ablation for atrial fibrillation by Dr. Curt Bears.  She had some abdominal pain a few days ago and was treated for a UTI as she also had some low grade fever.  She had some nausea which is now better.  She feels ok right now except some mild pain in the right abdomen.  She presented to the ER earlier today feeling unwell.  She was noted to have a right retroperitoneal hematoma.  She was in atrial fibrillation with rapid ventricular response.  She was found to have a hemoglobin of 8.  She was sent for a CT scan.  She is currently on Eliquis for atrial fibrillation.  She does have a history of some renal dysfunction.  She has never had an embolic event.  Other medical problems include arthritis, chronic back pain, congestive failure, diabetes elevated cholesterol hypertension hyperlipidemia all of which have been stable.  Past Medical History:  Diagnosis Date  . Acquired thrombophilia ([place]) 01/29/2020  . AKI (acute kidney injury) ([place]) 11/20/2019  . Allergy 09/08/2016  . AMS (altered mental status)   . Anemia in end-stage renal disease ([place]) 07/16/2019  . Anemia, normocytic normochromic 07/27/2018  . Ankle joint pain 04/09/2018  . Arthritis    "back, knees, arms, wrists" (05/15/2013)  . Asthma   . Asymmetrical left sensorineural hearing loss 07/31/2019  . Atrial fibrillation ([place]) 11/15/2016  . Breast cancer ([place])   . C. difficile colitis   . Calculus of kidney 08/12/2012  . CHF (congestive heart failure) ([place])    "mild" (05/15/2013)  . CHF (congestive heart failure) ([place])    "mild" (05/15/2013)  . Chronic bronchitis ([place])   . Chronic lower back pain   . Chronic non-seasonal allergic rhinitis 11/15/2016  . Chronic systolic congestive heart failure ([place])   . Chronic UTI   .  Cough variant asthma vs uacs  07/24/2017   Onset in her 20s some better p rx with allergy shots in her 30s Singulair trial 11/15/16 >  Improved 01/18/17:  FEV1 1.29 L (61%)  Ratio 75  Still on ACEi as of 07/24/2017  Spirometry 06/28/18   FEV1 1.3 (61%)  Ratio 77 with min curvature   - Allergy profile 07/26/2018 >  Eos 1.2 /  IgE  420  RAST Pos mold only   . Cough variant asthma vs uacs  07/24/2017   Onset in her 20s some better p rx with allergy shots in her 30s Singulair trial 11/15/16 >  Improved 01/18/17:  FEV1 1.29 L (61%)  Ratio 75  Still on ACEi as of 07/24/2017  Spirometry 06/28/18   FEV1 1.3 (61%)  Ratio 77 with min curvature   - Allergy profile 07/26/2018 >  Eos 1.2 /  IgE  420  RAST Pos mold only   . Diabetes mellitus type 2 in obese ([place])   . Diarrhea 11/15/2016  . Dilated cardiomyopathy ([place]) 07/16/2019  . DOE (dyspnea on exertion) 07/27/2018   Onset early 2019 assoc with uacs while on ACEi - 06/28/18    Walked RA x one lap =  approx 250 ft - stopped due to sob with sats still high 90's     . Dysphagia   . Dysrhythmia    atrial fib/dr [facility]  . Family history of  anesthesia complication    "my mother also had PONV" (05/15/2013)  . Fever 10/29/2017  . Foot pain 04/09/2018  . GERD (gastroesophageal reflux disease)   . Heart murmur   . High cholesterol   . History of breast cancer in female 07/19/2013  . HTN (hypertension) 05/08/2013  . Hydronephrosis of right kidney   . Hyperlipidemia 11/15/2016  . Hyponatremia 10/29/2017  . IDA (iron deficiency anemia) 04/19/2017  . Infection   . Kidney stones   . Left breast mass 07/19/2013  . Low back pain 05/01/2018  . Migraine    "haven't had one in the early 2000's" (05/15/2013)  . Mitral regurgitation 09/03/2020  . Mixed incontinence 07/24/2018  . Nephrolithiasis 11/15/2016  . Other chronic cystitis 05/19/2011  . PAF (paroxysmal atrial fibrillation) ([place])   . Personal history of chemotherapy   . Personal history of radiation  therapy   . Pneumonia    "used to be chronic; last time I had it was 2013" (05/15/2013)  . PONV (postoperative nausea and vomiting)   . Recurrent sinusitis 09/08/2016  . Recurrent UTI 11/20/2014  . Recurrent UTI (urinary tract infection)    "from the continuous kidney stones; take Macrodantin qd" (05/15/2013)  . Respiratory failure requiring intubation ([place]) 05/08/2013  . Restrictive lung disease 01/18/2017  . Sacroiliac joint pain 05/16/2018  . Sepsis ([place]) 05   kidney stone infection  . Septic shock ([place]) 09/23/2018  . Simple chronic bronchitis ([place]) 11/15/2016  . Strep throat 10/29/2017  . Tachypnea   . Tension headache   . Type II diabetes mellitus ([place])   . Type II or unspecified type diabetes mellitus without mention of complication, not stated as uncontrolled 05/11/2013  . Urinary tract stones 05/19/2011  . Urinary urgency 07/24/2018   Past Surgical History:  Procedure Laterality Date  . ATRIAL FIBRILLATION ABLATION N/A 11/25/2020   Procedure: ATRIAL FIBRILLATION ABLATION;  Surgeon: Constance Haw, MD;  Location: [facility];  Service: Cardiovascular;  Laterality: N/A;  . 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 BREAST BIOPSY Left 02/2001  . BREAST LUMPECTOMY Left 02/2001  . BREAST LUMPECTOMY Left 09/23/2013   Procedure: LEFT LUMPECTOMY WITH SPECIMEN MAMMOGRAM;  Surgeon: Adin Hector, MD;  Location: [facility];  Service: General;  Laterality: Left;  . BUBBLE STUDY  11/24/2020   Procedure: BUBBLE STUDY;  Surgeon: Geralynn Rile, MD;  Location: [facility];  Service: Cardiovascular;;  . COLONOSCOPY    . DIAGNOSTIC LAPAROSCOPY     cyst-near ovary  . LITHOTRIPSY     "2-3 times prior to 2002" (05/15/2013)  . SPINAL FUSION N/A 05/08/2013   Procedure: T9-S1 INSTRUMENTED FUSION T12 -S1 DECOMPRESSION;  Surgeon: Melina Schools, MD;  Location: [facility];  Service: Orthopedics;   Laterality: N/A;  . TEE WITHOUT CARDIOVERSION N/A 11/06/2020   Procedure: TRANSESOPHAGEAL ECHOCARDIOGRAM (TEE);  Surgeon: Fay Records, MD;  Location: [facility];  Service: Cardiovascular;  Laterality: N/A;  . TEE WITHOUT CARDIOVERSION N/A 11/24/2020   Procedure: TRANSESOPHAGEAL ECHOCARDIOGRAM (TEE);  Surgeon: Geralynn Rile, MD;  Location: [facility];  Service: Cardiovascular;  Laterality: N/A;  . TUBAL LIGATION Bilateral 1985    Family History  Problem Relation Age of Onset  . COPD Mother   . Heart disease Mother   . Breast cancer Mother   . Kidney cancer Father   . Liver cancer Brother   . Environmental Allergies Other   .  Lupus Other        niece  . Environmental Allergies Son     SOCIAL HISTORY: Social History   Socioeconomic History  . Marital status: Married    Spouse name: Not on file  . Number of children: Not on file  . Years of education: Not on file  . Highest education level: Not on file  Occupational History  . Not on file  Tobacco Use  . Smoking status: Never Smoker  . Smokeless tobacco: Never Used  . Tobacco comment: Mother & Father smoked  Vaping Use  . Vaping Use: Never used  Substance and Sexual Activity  . Alcohol use: No    Alcohol/week: 0.0 standard drinks  . Drug use: No  . Sexual activity: Yes  Other Topics Concern  . Not on file  Social History Narrative   [facility] (11/15/16):   Patient's originally from [place]. Has always lived in [place]. Currently has an outside dog. Remote exposure to [place] when her children were young. No known mold exposure. 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    Social Determinants of Health   Financial Resource Strain: Not on file  Food Insecurity: Not on file  Transportation Needs: Not on file  Physical Activity: Not on file  Stress: Not on file  Social Connections: Not on file  Intimate Partner Violence: Not on file     Allergies  Allergen Reactions  . Pneumococcal Vaccines Other (See Comments)    Really high fever, and flu symptoms. MD instructed not to give  . Cleocin [Clindamycin Hcl] Other (See Comments)    "irritated my esophagus"  . Morphine And Related Nausea And Vomiting  . Other Nausea And Vomiting and Swelling    Shrimp if eat a lot  . Shellfish-Derived Products Other (See Comments)    Patient stated she is allergic to "shrimp," if she eats "a lot"  . Ativan [Lorazepam] Other (See Comments)    Difficulty waking  . Buprenorphine Hcl Nausea And Vomiting    Pt does not know if she has ever had this medication    No current facility-administered medications for this encounter.   Current Outpatient Medications  Medication Sig Dispense Refill  . acetaminophen (TYLENOL) 500 MG tablet Take 500 mg by mouth every 6 (six) hours as needed for mild pain or headache.     [place] Kitchen apixaban (ELIQUIS) 5 MG TABS tablet Take 5 mg by mouth 2 (two) times daily.    . Ascorbic Acid (VITAMIN C) 1000 MG tablet Take 1,000 mg by mouth in the morning.    Marland Kitchen atorvastatin (LIPITOR) 10 MG tablet Take 10 mg by mouth at bedtime.    . carvedilol (COREG) 6.25 MG tablet Take 6.25 mg by mouth 2 (two) times daily with a meal. Patient will take 1 tablet (6.25 mg) by mouth 3 times daily if in afib    . cholecalciferol (VITAMIN D) 25 MCG (1000 UNIT) tablet Take 1,000 Units by mouth in the morning.    . fluticasone (FLONASE) 50 MCG/ACT nasal spray Place 1 spray into both nostrils daily as needed for allergies or rhinitis.    . furosemide (LASIX) 20 MG tablet Take 20 mg by mouth daily as needed for edema (weight gain of 2 lbs or more x 2 days).    Marland Kitchen glipiZIDE (GLUCOTROL XL) 10 MG 24 hr tablet Take 10 mg by mouth in the morning and at bedtime.     Marland Kitchen glucagon (  GLUCAGON EMERGENCY) 1 MG injection Inject 1 mg into the muscle as needed (When glucose drop low levels).    . loratadine (CLARITIN) 10 MG tablet Take 10 mg by mouth in the morning.     . montelukast (SINGULAIR) 10 MG tablet Take 10 mg by mouth at bedtime.    Marland Kitchen omeprazole (PRILOSEC) 20 MG capsule Take 20 mg by mouth in the morning.    Marland Kitchen OVER THE COUNTER MEDICATION Take 2 capsules by mouth 2 (two) times daily. Glucosil for blood sugar    . sertraline (ZOLOFT) 50 MG tablet Take 50 mg by mouth in the morning.    . Zinc 50 MG TABS Take 50 mg by mouth in the morning.      ROS:   General:  No weight loss, Fever, chills  HEENT: No recent headaches, no nasal bleeding, no visual changes, no sore throat  Neurologic: No dizziness, blackouts, seizures. No recent symptoms of stroke or mini- stroke. No recent episodes of slurred speech, or temporary blindness.  Cardiac: No recent episodes of chest pain/pressure, no shortness of breath at rest.  No shortness of breath with exertion.  +history of atrial fibrillation or irregular heartbeat  Vascular: No history of rest pain in feet.  No history of claudication.  No history of non-healing ulcer, No history of DVT   Pulmonary: No home oxygen, no productive cough, no hemoptysis,  No asthma or wheezing  Musculoskeletal:  [X]  Arthritis, [X]  Low back pain,  [X]  Joint pain  Hematologic:No history of hypercoagulable state.  No history of easy bleeding.  No history of anemia  Gastrointestinal: No hematochezia or melena,  No gastroesophageal reflux, no trouble swallowing  Urinary: [X]  chronic Kidney disease, [ ]  on HD - [ ]  MWF or [ ]  TTHS, [ ]  Burning with urination, [ ]  Frequent urination, [ ]  Difficulty urinating;   Skin: No rashes  Psychological: No history of anxiety,  No history of depression   Physical Examination  Vitals:   12/05/20 1645 12/05/20 1700 12/05/20 1715 12/05/20 1730  BP: 107/61 110/64 104/66 104/75  Pulse: (!) 53 (!) 106 (!) 124 (!) 52  Resp: 19 19 18 19   Temp:      SpO2: 97% 99% 97% 91%    There is no height or weight on file to calculate BMI.  General:  Alert and oriented, no acute distress HEENT:  Normal Neck: No JVD Cardiac: Regular Rate and Rhythm Abdomen: obese soft mass in right lower quadrant tender to palpation. Skin: No rash Extremity Pulses:  2+ radial, brachial, femoral, dorsalis pedis pulses bilaterally Musculoskeletal: No deformity or edema  Neurologic: Upper and lower extremity motor 5/5 and symmetric  DATA:  CBC    Component Value Date/Time   WBC 11.5 (H) 12/05/2020 1548   RBC 2.52 (L) 12/05/2020 1548   HGB 7.9 (L) 12/05/2020 1548   HGB 12.6 10/26/2020 1631   HGB 11.4 (L) 05/22/2017 1005   HGB 12.9 03/07/2007 0853   HCT 25.9 (L) 12/05/2020 1548   HCT 38.7 10/26/2020 1631   HCT 34.6 (L) 05/22/2017 1005   HCT 36.2 03/07/2007 0853   PLT 353 12/05/2020 1548   PLT 212 10/26/2020 1631   MCV 102.8 (H) 12/05/2020 1548   MCV 92 10/26/2020 1631   MCV 93 05/22/2017 1005   MCV 90.0 03/07/2007 0853   MCH 31.3 12/05/2020 1548   MCHC 30.5 12/05/2020 1548   RDW 17.8 (H) 12/05/2020 1548   RDW 14.9 10/26/2020 1631   RDW 15.7  05/22/2017 1005   RDW 13.0 03/07/2007 0853   LYMPHSABS 1.7 10/19/2020 1329   LYMPHSABS 1.9 05/22/2017 1005   LYMPHSABS 1.8 03/07/2007 0853   MONOABS 0.4 10/19/2020 1329   MONOABS 0.4 03/07/2007 0853   EOSABS 0.2 10/19/2020 1329   EOSABS 0.2 05/22/2017 1005   BASOSABS 0.1 10/19/2020 1329   BASOSABS 0.0 05/22/2017 1005   BASOSABS 0.0 03/07/2007 0853    BMET    Component Value Date/Time   NA 136 12/05/2020 1548   NA 139 11/02/2020 1136   NA 144 04/17/2017 1150   NA 141 04/18/2016 1256   K 4.4 12/05/2020 1548   K 4.6 04/17/2017 1150   K 4.4 04/18/2016 1256   CL 108 12/05/2020 1548   CL 109 (H) 04/17/2017 1150   CO2 19 (L) 12/05/2020 1548   CO2 23 04/17/2017 1150   CO2 18 (L) 04/18/2016 1256   GLUCOSE 260 (H) 12/05/2020 1548   GLUCOSE 212 (H) 04/17/2017 1150   BUN 22 12/05/2020 1548   BUN 33 (H) 11/02/2020 1136   BUN 28 (H) 04/17/2017 1150   BUN 27.3 (H) 04/18/2016 1256   CREATININE 1.47 (H) 12/05/2020 1548   CREATININE 1.21 (H)  10/19/2020 1329   CREATININE 1.3 (H) 04/17/2017 1150   CREATININE 1.4 (H) 04/18/2016 1256   CALCIUM 8.4 (L) 12/05/2020 1548   CALCIUM 9.6 04/17/2017 1150   CALCIUM 9.4 04/18/2016 1256   GFRNONAA 39 (L) 12/05/2020 1548   GFRNONAA 50 (L) 10/19/2020 1329   GFRAA 51 (L) 01/21/2020 1101   I reviewed and interpreted the images of the patient's CT scan which was noncontrast scan done earlier today.  There is a retroperitoneal hematoma in the right pelvis which is 10 x 10 cm.  ASSESSMENT: 1.  I have advised the emergency room to contact cardiology for admission since most likely this is a complication related to the recent ablation procedure.  2.  Would recommend stopping her Eliquis  3.  Would transfuse at this point since she has rapid A. fib this may be secondary to her anemia.  4.  Would recommend serial hemoglobins every 6 hours.  Since her creatinine is elevated at 1.5 I would not perform contrast CT until she can be properly hydrated and resuscitated.  If she becomes hemodynamically unstable would proceed with CTA.  5. Keep NPO will reassess starting diet in am   PLAN: See above   Ruta Hinds, MD [facility] Office: [phone]

## 2020-12-05 NOTE — H&P (Addendum)
Cardiology Admission History and Physical:   Patient ID: [name] MRN: [id number]; DOB: Jan 06, 1956   Admission date: 12/05/2020  Primary Care Provider: Enid Skeens., MD Primary Cardiologist: None  Primary Electrophysiologist:  Will [name], MD   Chief Complaint:  Abdominal pain  Patient Profile:   [name] is a 65 y.o. female with history of AF s/p PVI on 5/25 (on apixaban), HTN, HLD, DM2 who presents with abdominal pain and generally feeling unwell.   History of Present Illness:   [name] underwent ablation for AF on 5/25. The peri-procedural period was uncomplicated. She developed abdominal pain a few days ago. She was seen by her PCP for this and was prescribed a course of antibiotics for presumed UTI. She had been taking this as prescribed, however her symptoms persisted. She then developed recurrent atrial fibrillation, which she states made her feel very unwell.  All day today she has felt weak and tired, which she attributed to the atrial fibrillation.  Given the symptoms, she came to the emergency department for work-up.  On arrival to the ED, the patient was found to be in rapid atrial fibrillation with heart rates reaching the 120s.  Her blood pressure was on the lower side as well with systolics ranging from 66-4 10.  The rest of her vital signs were unremarkable.  Her laboratory studies revealed a hematocrit of 25.9, which was decreased from 38.7 drawn about 1 month prior  Notably she had been taking her apixaban as prescribed since her ablation.  A CT scan was performed which revealed a large acute appearing hematoma within the right hemipelvis extending from the right pelvic sidewall to the midline pelvis measuring approximately 11 x 8 x 9 cm.  The patient was given 1 g of magnesium for a mag of 1.1 and her atrial fibrillation converted to normal sinus rhythm.  Vascular surgery was consulted and and recommended conservative management at this  time.  Currently the patient states that she feels reasonably well now that she is out of atrial fibrillation.  She has persistent mild lower abdominal discomfort.  She otherwise has no complaints.  Heart Pathway Score:     Past Medical History:  Diagnosis Date  . Acquired thrombophilia ([place]) 01/29/2020  . AKI (acute kidney injury) ([place]) 11/20/2019  . Allergy 09/08/2016  . AMS (altered mental status)   . 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 Anemia, normocytic normochromic 07/27/2018  . Ankle joint pain 04/09/2018  . Arthritis    "back, knees, arms, wrists" (05/15/2013)  . Asthma   . Asymmetrical left sensorineural hearing loss 07/31/2019  . Atrial fibrillation ([place]) 11/15/2016  . Breast cancer ([place])   . C. difficile colitis   . Calculus of kidney 08/12/2012  . CHF (congestive heart failure) ([place])    "mild" (05/15/2013)  . CHF (congestive heart failure) ([place])    "mild" (05/15/2013)  . Chronic bronchitis ([place])   . Chronic lower back pain   . Chronic non-seasonal allergic rhinitis 11/15/2016  . Chronic systolic congestive heart failure ([place])   . Chronic UTI   . Cough variant asthma vs uacs  07/24/2017   Onset in her 20s some better p rx with allergy shots in her 30s Singulair trial 11/15/16 >  Improved 01/18/17:  FEV1 1.29 L (61%)  Ratio 75  Still on ACEi as of 07/24/2017  Spirometry 06/28/18   FEV1 1.3 (61%)  Ratio 77 with min curvature   - Allergy profile 07/26/2018 >  Eos 1.2 /  IgE  420  RAST Pos mold only   . Cough variant asthma vs uacs  07/24/2017   Onset in her 20s some better p rx with allergy shots in her 30s Singulair trial 11/15/16 >  Improved 01/18/17:  FEV1 1.29 L (61%)  Ratio 75  Still on ACEi as of 07/24/2017  Spirometry 06/28/18   FEV1 1.3 (61%)  Ratio 77 with min curvature   - Allergy profile 07/26/2018 >  Eos 1.2 /  IgE  420  RAST Pos mold only   . Diabetes mellitus type 2 in obese ([place])   . Diarrhea 11/15/2016  . Dilated cardiomyopathy ([place]) 07/16/2019  . DOE  (dyspnea on exertion) 07/27/2018   Onset early 2019 assoc with uacs while on ACEi - 06/28/18    Walked RA x one lap =  approx 250 ft - stopped due to sob with sats still high 90's     . Dysphagia   . Dysrhythmia    atrial fib/dr [facility]  . Family history of anesthesia complication    "my mother also had PONV" (05/15/2013)  . Fever 10/29/2017  . Foot pain 04/09/2018  . GERD (gastroesophageal reflux disease)   . Heart murmur   . High cholesterol   . History of breast cancer in female 07/19/2013  . HTN (hypertension) 05/08/2013  . Hydronephrosis of right kidney   . Hyperlipidemia 11/15/2016  . Hyponatremia 10/29/2017  . IDA (iron deficiency anemia) 04/19/2017  . Infection   . Kidney stones   . Left breast mass 07/19/2013  . Low back pain 05/01/2018  . Migraine    "haven't had one in the early 2000's" (05/15/2013)  . Mitral regurgitation 09/03/2020  . Mixed incontinence 07/24/2018  . Nephrolithiasis 11/15/2016  . Other chronic cystitis 05/19/2011  . PAF (paroxysmal atrial fibrillation) ([place])   . Personal history of chemotherapy   . Personal history of radiation therapy   . Pneumonia    "used to be chronic; last time I had it was 2013" (05/15/2013)  . PONV (postoperative nausea and vomiting)   . Recurrent sinusitis 09/08/2016  . Recurrent UTI 11/20/2014  . Recurrent UTI (urinary tract infection)    "from the continuous kidney stones; take Macrodantin qd" (05/15/2013)  . Respiratory failure requiring intubation ([place]) 05/08/2013  . Restrictive lung disease 01/18/2017  . Sacroiliac joint pain 05/16/2018  . Sepsis ([place]) 05   kidney stone infection  . Septic shock ([place]) 09/23/2018  . Simple chronic bronchitis ([place]) 11/15/2016  . Strep throat 10/29/2017  . Tachypnea   . Tension headache   . Type II diabetes mellitus ([place])   . Type II or unspecified type diabetes mellitus without mention of complication, not stated as uncontrolled 05/11/2013  . Urinary tract stones 05/19/2011   . Urinary urgency 07/24/2018    Past Surgical History:  Procedure Laterality Date  . ATRIAL FIBRILLATION ABLATION N/A 11/25/2020   Procedure: ATRIAL FIBRILLATION ABLATION;  Surgeon: Constance Haw, MD;  Location: [facility];  Service: Cardiovascular;  Laterality: N/A;  . 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 BREAST BIOPSY Left 02/2001  . BREAST LUMPECTOMY Left 02/2001  . BREAST LUMPECTOMY Left 09/23/2013   Procedure: LEFT LUMPECTOMY WITH SPECIMEN MAMMOGRAM;  Surgeon: Adin Hector, MD;  Location: [facility];  Service: General;  Laterality: Left;  . BUBBLE STUDY  11/24/2020   Procedure:  BUBBLE STUDY;  Surgeon: Geralynn Rile, MD;  Location: [facility];  Service: Cardiovascular;;  . COLONOSCOPY    . DIAGNOSTIC LAPAROSCOPY     cyst-near ovary  . LITHOTRIPSY     "2-3 times prior to 2002" (05/15/2013)  . SPINAL FUSION N/A 05/08/2013   Procedure: T9-S1 INSTRUMENTED FUSION T12 -S1 DECOMPRESSION;  Surgeon: Melina Schools, MD;  Location: [facility];  Service: Orthopedics;  Laterality: N/A;  . TEE WITHOUT CARDIOVERSION N/A 11/06/2020   Procedure: TRANSESOPHAGEAL ECHOCARDIOGRAM (TEE);  Surgeon: Fay Records, MD;  Location: [facility];  Service: Cardiovascular;  Laterality: N/A;  . TEE WITHOUT CARDIOVERSION N/A 11/24/2020   Procedure: TRANSESOPHAGEAL ECHOCARDIOGRAM (TEE);  Surgeon: Geralynn Rile, MD;  Location: [facility];  Service: Cardiovascular;  Laterality: N/A;  . TUBAL LIGATION Bilateral 1985     Medications Prior to Admission: Prior to Admission medications   Medication Sig Start Date End Date Taking? Authorizing Provider  acetaminophen (TYLENOL) 500 MG tablet Take 500 mg by mouth every 6 (six) hours as needed for mild pain or headache.     [provider]  apixaban (ELIQUIS) 5 MG TABS tablet Take 5 mg by mouth 2 (two) times daily.    [provider]  Ascorbic Acid (VITAMIN C) 1000 MG tablet Take 1,000 mg by mouth in the morning.    [provider]  atorvastatin (LIPITOR) 10 MG tablet Take 10 mg by mouth at bedtime.    [provider]  carvedilol (COREG) 6.25 MG tablet Take 6.25 mg by mouth 2 (two) times daily with a meal. Patient will take 1 tablet (6.25 mg) by mouth 3 times daily if in afib    [provider]  cholecalciferol (VITAMIN D) 25 MCG (1000 UNIT) tablet Take 1,000 Units by mouth in the morning.    [provider]  fluticasone (FLONASE) 50 MCG/ACT nasal spray Place 1 spray into both nostrils daily as needed for allergies or rhinitis.    [provider]  furosemide (LASIX) 20 MG tablet Take 20 mg by mouth daily as needed for edema (weight gain of 2 lbs or more x 2 days).    [provider]  glipiZIDE (GLUCOTROL XL) 10 MG 24 hr tablet Take 10 mg by mouth in the morning and at bedtime.  07/11/19   [provider]  glucagon (GLUCAGON EMERGENCY) 1 MG injection Inject 1 mg into the muscle as needed (When glucose drop low levels). 03/21/19   [provider]  loratadine (CLARITIN) 10 MG tablet Take 10 mg by mouth in the morning.    [provider]  montelukast (SINGULAIR) 10 MG tablet Take 10 mg by mouth at bedtime.    [provider]  omeprazole (PRILOSEC) 20 MG capsule Take 20 mg by mouth in the morning. 03/31/13   [provider]  OVER THE COUNTER MEDICATION Take 2 capsules by mouth 2 (two) times daily. Glucosil for blood sugar    [provider]  sertraline (ZOLOFT) 50 MG tablet Take 50 mg by mouth in the morning. 10/08/19   [provider]  Zinc 50 MG TABS Take 50 mg by mouth in the morning.    [provider]     Allergies:    Allergies  Allergen Reactions  . Pneumococcal Vaccines Other (See Comments)    Really high fever, and flu symptoms. MD instructed not to give  . Cleocin [Clindamycin Hcl]  Other (See Comments)    "irritated my esophagus"  . Morphine And  Related Nausea And Vomiting  . Other Nausea And Vomiting and Swelling    Shrimp if eat a lot  . Shellfish-Derived Products Other (See Comments)    Patient stated she is allergic to "shrimp," if she eats "a lot"  . Ativan [Lorazepam] Other (See Comments)    Difficulty waking  . Buprenorphine Hcl Nausea And Vomiting    Pt does not know if she has ever had this medication    Social History:   Social History   Socioeconomic History  . Marital status: Married    Spouse name: Not on file  . Number of children: Not on file  . Years of education: Not on file  . Highest education level: Not on file  Occupational History  . Not on file  Tobacco Use  . Smoking status: Never Smoker  . Smokeless tobacco: Never Used  . Tobacco comment: Mother & Father smoked  Vaping Use  . Vaping Use: Never used  Substance and Sexual Activity  . Alcohol use: No    Alcohol/week: 0.0 standard drinks  . Drug use: No  . Sexual activity: Yes  Other Topics Concern  . Not on file  Social History Narrative   [facility] (11/15/16):   Patient's originally from [place]. Has always lived in [place]. Currently has an outside dog. Remote exposure to [place] when her children were young. No known mold exposure. 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    Social Determinants of Health   Financial Resource Strain: Not on file  Food Insecurity: Not on file  Transportation Needs: Not on file  Physical Activity: Not on file  Stress: Not on file  Social Connections: Not on file  Intimate Partner Violence: Not on file    Family History:   The patient's family history includes Breast cancer in her mother; COPD in her mother; Environmental Allergies in her son and another family member; Heart disease in her mother; Kidney cancer in her father; Liver cancer in her brother; Lupus in an other  family member.    ROS:  Please see the history of present illness.  All other ROS reviewed and negative.     Physical Exam/Data:   Vitals:   12/05/20 1845 12/05/20 1900 12/05/20 2000 12/05/20 2030  BP: 100/69 111/72 121/69 110/71  Pulse: 87 90 87 88  Resp: (!) 21 19 20 17   Temp:      SpO2: 98% 98% 100% 100%   No intake or output data in the 24 hours ending 12/05/20 2038 Last 3 Weights 11/25/2020 11/20/2020 11/06/2020  Weight (lbs) 153 lb 155 lb 153 lb  Weight (kg) 69.4 kg 70.308 kg 69.4 kg     There is no height or weight on file to calculate BMI.  General:  Well nourished, well developed, in no acute distress HEENT: normal Lymph: no adenopathy Neck: no JVD Endocrine:  No thryomegaly Vascular: No carotid bruits; FA pulses 2+ bilaterally without bruits  Cardiac:  normal S1, S2; RRR; no murmur Lungs:  clear to auscultation bilaterally, no wheezing, rhonchi or rales  Abd: soft, TTP in the lower midline abdomen, ND Ext: no edema Musculoskeletal:  No deformities, BUE and BLE strength normal and equal Skin: warm and dry, no appreciable ecchymoses  EKG:  The ECG that was done after conversion to NSR was personally reviewed and demonstrates NSR, no other abnormalities  Relevant CV Studies: 10/06/20 Echo 1. Left ventricular ejection fraction, by estimation, is 40 to 45%.  The  left ventricle has mildly decreased function. The left ventricle  demonstrates global hypokinesis. There is moderate concentric left  ventricular hypertrophy. Left ventricular  diastolic parameters are indeterminate. The average left ventricular  global longitudinal strain is -9.0 %. The global longitudinal strain is  abnormal.  2. Right ventricular systolic function is normal. The right ventricular  size is normal. There is moderately elevated pulmonary artery systolic  pressure.  3. Left atrial size was mildly dilated.  4. The mitral valve is calcified. There is moderate posterior mitral  annular  calcification. Moderate to severe mitral valve regurgitation. MR  vol 63 ml, ERO 0.42 cmsq. No evidence of mitral stenosis.  5. Tricuspid valve regurgitation is moderate to severe.  6. The aortic valve is normal in structure. Aortic valve regurgitation is  not visualized. No aortic stenosis is present.  7. Abdominal aorta is mildly dilated (2.3 cm).  8. The inferior vena cava is normal in size with greater than 50%  respiratory variability, suggesting right atrial pressure of 3 mmHg.   Laboratory Data:  High Sensitivity Troponin:  No results for input(s): TROPONINIHS in the last 720 hours.    Chemistry Recent Labs  Lab 12/05/20 1548  NA 136  K 4.4  CL 108  CO2 19*  GLUCOSE 260*  BUN 22  CREATININE 1.47*  CALCIUM 8.4*  GFRNONAA 39*  ANIONGAP 9    No results for input(s): PROT, ALBUMIN, AST, ALT, ALKPHOS, BILITOT in the last 168 hours. Hematology Recent Labs  Lab 12/05/20 1548  WBC 11.5*  RBC 2.52*  HGB 7.9*  HCT 25.9*  MCV 102.8*  MCH 31.3  MCHC 30.5  RDW 17.8*  PLT 353   BNPNo results for input(s): BNP, PROBNP in the last 168 hours.  DDimer No results for input(s): DDIMER in the last 168 hours.   Radiology/Studies:  CT Abdomen Pelvis Wo Contrast  Result Date: 12/05/2020 CLINICAL DATA:  Anemia, recent cardiac ablation. Evaluate for retroperitoneal bleed. EXAM: CT ABDOMEN AND PELVIS WITHOUT CONTRAST TECHNIQUE: Multidetector CT imaging of the abdomen and pelvis was performed following the standard protocol without IV contrast. COMPARISON:  CT abdomen dated 09/26/2018 FINDINGS: Lower chest: No acute abnormality. Hepatobiliary: No focal liver abnormality is seen. No gallstones, gallbladder wall thickening, or biliary dilatation. Pancreas: Unremarkable. No pancreatic ductal dilatation or surrounding inflammatory changes. Spleen: Normal in size without focal abnormality. Adrenals/Urinary Tract: Adrenal glands appear normal. Kidneys are unremarkable without hydronephrosis  or perinephric fluid/hemorrhage. Punctate nonobstructing RIGHT renal stone. No ureteral or bladder calculi. Bladder is partially decompressed, displaced to the LEFT pelvis by acute hemorrhage in the RIGHT hemipelvis. Stomach/Bowel: No dilated large or small bowel loops. No evidence of bowel wall inflammation. Appendix is normal. Stomach is unremarkable, partially decompressed. Vascular/Lymphatic: Aortic atherosclerosis. No enlarged lymph nodes are seen. Reproductive: Partially calcified fibroids within the otherwise normal appearing uterus. LEFT adnexal regions unremarkable. Other: Large acute hematoma within the RIGHT hemipelvis, extending from the RIGHT pelvic sidewall to the midline pelvis, measuring 10.5 x 8 x 9.1 cm, displacing the bladder to the LEFT hemipelvis. Additional fluid stranding/hemorrhage within the more anterior pelvis. Musculoskeletal: No acute-appearing osseous abnormality. Surgical changes of central canal decompression of the lower lumbar spine with associated fixation hardware in place throughout the lumbar spine. Superficial/subcutaneous soft tissues are unremarkable. IMPRESSION: 1. Large acute-appearing hematoma within the RIGHT hemipelvis, extending from the RIGHT pelvic sidewall to the midline pelvis, measuring 10.5 x 8 x 9.1 cm, displacing the bladder to the LEFT hemipelvis. Additional fluid stranding/hemorrhage  within the more anterior pelvis. Consider CT angiogram to exclude active hemorrhage. 2. Punctate nonobstructing RIGHT renal stone. No evidence of ureteral or bladder calculi. No hydronephrosis. 3. Additional chronic/incidental findings detailed above. Aortic Atherosclerosis (ICD10-I70.0). Critical Value/emergent results were called by telephone at the time of interpretation on 12/05/2020 at 6:15 pm to provider [facility] , who verbally acknowledged these results. Electronically Signed   By: Franki Cabot M.D.   On: 12/05/2020 18:17   DG Chest Port 1 View  Result Date:  12/05/2020 CLINICAL DATA:  Shortness of breath, recent cardiac ablation. EXAM: PORTABLE CHEST 1 VIEW COMPARISON:  Chest radiograph dated 09/01/2020. FINDINGS: The heart is enlarged. Vascular calcifications are seen in the aortic arch. There is mild left basilar atelectasis. The right lung is clear. There is no pleural effusion or pneumothorax. Spinal fixation hardware is redemonstrated. IMPRESSION: Mild left basilar atelectasis. Aortic Atherosclerosis (ICD10-I70.0). Electronically Signed   By: Zerita Boers M.D.   On: 12/05/2020 16:35     Assessment and Plan:  KAYTLAN BEHRMAN is a 65 y.o. female with history of AF s/p PVI on 5/25 (on apixaban), HTN, HLD, DM2 who presents with abdominal pain, found to have a large RP hematoma.   #) Hematoma: likely secondary to venous groin access from recent PVI, at increased risk given that she is on apixaban. Currently hemodynamically stable.  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 Inpatient status is judged to be reasonable and necessary in order to provide the required intensity of service to ensure the patient's safety. The patient's presenting symptoms, physical exam findings, and initial radiographic and laboratory data in the context of their chronic comorbidities is felt to place them at high risk for further clinical  deterioration. Furthermore, it is not anticipated that the patient will be medically stable for discharge from the hospital within 2 midnights of admission. The following factors support the patient status of inpatient.   " The patient's presenting symptoms include abdominal pain. " The worrisome physical exam findings include abdominal tenderness. " The initial radiographic and laboratory data are worrisome because of RP hematoma. " The chronic co-morbidities include obesity, HTN, HLD, AF.   * I certify that at the point of admission it is my clinical judgment that the patient will require inpatient hospital care spanning beyond 2 midnights from the point of admission due to high intensity of service, high risk for further deterioration and high frequency of surveillance required.*    For questions or updates, please contact [place] Please consult [email or web address] for contact info under        Signed, Marcie Mowers, MD  12/05/2020 8:38 PM

## 2020-12-05 NOTE — ED Provider Notes (Signed)
[facility] EMERGENCY DEPARTMENT Provider Note   CSN: [id number] Arrival date & time: 12/05/20  1537     History Chief Complaint  Patient presents with  . Atrial Fibrillation    [name] is a 65 y.o. female.  The history is provided by the patient and medical records.  Atrial Fibrillation   [name] is a 65 y.o. female who presents to the Emergency Department complaining of atrial fibrillation. She presents the emergency department by EMS for evaluation of atrial fibrillation. She had a cardiac ablation on May 25. She has been compliant with her home medications since that time. She states that since the ablation and she has experienced a few episodes of racing heartbeat but nothing persistent. Today she felt unwell overall, similar to her prior episodes of a fib. She went to the urgent care and was found to be in a fib with RVR and was referred to the emergency department. She received 500 mL of normal saline by EMS prior to ED arrival. She did miss her home glipizide today.    Past Medical History:  Diagnosis Date  . Acquired thrombophilia ([place]) 01/29/2020  . AKI (acute kidney injury) ([place]) 11/20/2019  . Allergy 09/08/2016  . AMS (altered mental status)   . Anemia in end-stage renal disease ([place]) 07/16/2019  . Anemia, normocytic normochromic 07/27/2018  . Ankle joint pain 04/09/2018  . Arthritis    "back, knees, arms, wrists" (05/15/2013)  . Asthma   . Asymmetrical left sensorineural hearing loss 07/31/2019  . Atrial fibrillation ([place]) 11/15/2016  . Breast cancer ([place])   . C. difficile colitis   . Calculus of kidney 08/12/2012  . CHF (congestive heart failure) ([place])    "mild" (05/15/2013)  . CHF (congestive heart failure) ([place])    "mild" (05/15/2013)  . Chronic bronchitis ([place])   . Chronic lower back pain   . Chronic non-seasonal allergic rhinitis 11/15/2016  . Chronic systolic congestive heart failure ([place])   . Chronic UTI   . Cough variant  asthma vs uacs  07/24/2017   Onset in her 20s some better p rx with allergy shots in her 30s Singulair trial 11/15/16 >  Improved 01/18/17:  FEV1 1.29 L (61%)  Ratio 75  Still on ACEi as of 07/24/2017  Spirometry 06/28/18   FEV1 1.3 (61%)  Ratio 77 with min curvature   - Allergy profile 07/26/2018 >  Eos 1.2 /  IgE  420  RAST Pos mold only   . Cough variant asthma vs uacs  07/24/2017   Onset in her 20s some better p rx with allergy shots in her 30s Singulair trial 11/15/16 >  Improved 01/18/17:  FEV1 1.29 L (61%)  Ratio 75  Still on ACEi as of 07/24/2017  Spirometry 06/28/18   FEV1 1.3 (61%)  Ratio 77 with min curvature   - Allergy profile 07/26/2018 >  Eos 1.2 /  IgE  420  RAST Pos mold only   . Diabetes mellitus type 2 in obese ([place])   . Diarrhea 11/15/2016  . Dilated cardiomyopathy ([place]) 07/16/2019  . DOE (dyspnea on exertion) 07/27/2018   Onset early 2019 assoc with uacs while on ACEi - 06/28/18    Walked RA x one lap =  approx 250 ft - stopped due to sob with sats still high 90's     . Dysphagia   . Dysrhythmia    atrial fib/dr [facility] cardi[facility]  . Family history of anesthesia complication    "  my mother also had PONV" (05/15/2013)  . Fever 10/29/2017  . Foot pain 04/09/2018  . GERD (gastroesophageal reflux disease)   . Heart murmur   . High cholesterol   . History of breast cancer in female 07/19/2013  . HTN (hypertension) 05/08/2013  . Hydronephrosis of right kidney   . Hyperlipidemia 11/15/2016  . Hyponatremia 10/29/2017  . Alexandra (iron deficiency anemia) 04/19/2017  . Infection   . Kidney stones   . Left breast mass 07/19/2013  . Low back pain 05/01/2018  . Migraine    "haven't had one in the early 2000's" (05/15/2013)  . Mitral regurgitation 09/03/2020  . Mixed incontinence 07/24/2018  . Nephrolithiasis 11/15/2016  . Other chronic cystitis 05/19/2011  . PAF (paroxysmal atrial fibrillation) ([place])   . Personal history of chemotherapy   . Personal history of radiation therapy   .  Pneumonia    "used to be chronic; last time I had it was 2013" (05/15/2013)  . PONV (postoperative nausea and vomiting)   . Recurrent sinusitis 09/08/2016  . Recurrent UTI 11/20/2014  . Recurrent UTI (urinary tract infection)    "from the continuous kidney stones; take Macrodantin qd" (05/15/2013)  . Respiratory failure requiring intubation ([place]) 05/08/2013  . Restrictive lung disease 01/18/2017  . Sacroiliac joint pain 05/16/2018  . Sepsis ([place]) 05   kidney stone infection  . Septic shock ([place]) 09/23/2018  . Simple chronic bronchitis ([place]) 11/15/2016  . Strep throat 10/29/2017  . Tachypnea   . Tension headache   . Type II diabetes mellitus ([place])   . Type II or unspecified type diabetes mellitus without mention of complication, not stated as uncontrolled 05/11/2013  . Urinary tract stones 05/19/2011  . Urinary urgency 07/24/2018    Patient Active Problem List   Diagnosis Date Noted  . Retroperitoneal bleed 12/05/2020  . Type II diabetes mellitus ([place])   . Personal history of radiation therapy   . Arthritis   . Chronic bronchitis ([place])   . Mitral regurgitation 09/03/2020  . CHF (congestive heart failure) ([place])   . Tension headache   . Recurrent UTI (urinary tract infection)   . PONV (postoperative nausea and vomiting)   . Pneumonia   . Personal history of chemotherapy   . Migraine   . Kidney stones   . High cholesterol   . Heart murmur   . Family history of anesthesia complication   . Dysrhythmia   . Chronic lower back pain   . Acquired thrombophilia ([place]) 01/29/2020  . AKI (acute kidney injury) ([place]) 11/20/2019  . Asymmetrical left sensorineural hearing loss 07/31/2019  . Dilated cardiomyopathy ([place]) 07/16/2019  . Anemia in end-stage renal disease ([place]) 07/16/2019  . C. difficile colitis   . Chronic systolic congestive heart failure ([place])   . Chronic UTI   . Diabetes mellitus type 2 in obese ([place])   . Dysphagia   . Infection   . PAF (paroxysmal atrial fibrillation) ([place])    . Tachypnea   . Hydronephrosis of right kidney   . AMS (altered mental status)   . Septic shock ([place]) 09/23/2018  . DOE (dyspnea on exertion) 07/27/2018  . Anemia, normocytic normochromic 07/27/2018  . Mixed incontinence 07/24/2018  . Urinary urgency 07/24/2018  . Sacroiliac joint pain 05/16/2018  . Low back pain 05/01/2018  . Ankle joint pain 04/09/2018  . Foot pain 04/09/2018  . Fever 10/29/2017  . Sepsis ([place]) 10/29/2017  . Hyponatremia 10/29/2017  . Strep throat 10/29/2017  . Cough variant asthma vs  uacs  07/24/2017  . Alexandra (iron deficiency anemia) 04/19/2017  . Restrictive lung disease 01/18/2017  . Simple chronic bronchitis ([place]) 11/15/2016  . Asthma 11/15/2016  . Chronic non-seasonal allergic rhinitis 11/15/2016  . Hyperlipidemia 11/15/2016  . Nephrolithiasis 11/15/2016  . GERD (gastroesophageal reflux disease) 11/15/2016  . Diarrhea 11/15/2016  . Atrial fibrillation ([place]) 11/15/2016  . Allergy 09/08/2016  . Recurrent sinusitis 09/08/2016  . Recurrent UTI 11/20/2014  . Left breast mass 07/19/2013  . History of breast cancer in female 07/19/2013  . Type II or unspecified type diabetes mellitus without mention of complication, not stated as uncontrolled 05/11/2013  . Respiratory failure requiring intubation ([place]) 05/08/2013  . HTN (hypertension) 05/08/2013  . Calculus of kidney 08/12/2012  . Breast cancer ([place])   . Other chronic cystitis 05/19/2011  . Urinary tract stones 05/19/2011    Past Surgical History:  Procedure Laterality Date  . ATRIAL FIBRILLATION ABLATION N/A 11/25/2020   Procedure: ATRIAL FIBRILLATION ABLATION;  Surgeon: Constance Haw, MD;  Location: [facility];  Service: Cardiovascular;  Laterality: N/A;  . 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 BREAST BIOPSY Left 02/2001  . BREAST LUMPECTOMY Left 02/2001  . BREAST LUMPECTOMY Left 09/23/2013    Procedure: LEFT LUMPECTOMY WITH SPECIMEN MAMMOGRAM;  Surgeon: Adin Hector, MD;  Location: [facility];  Service: General;  Laterality: Left;  . BUBBLE STUDY  11/24/2020   Procedure: BUBBLE STUDY;  Surgeon: Geralynn Rile, MD;  Location: [facility];  Service: Cardiovascular;;  . COLONOSCOPY    . DIAGNOSTIC LAPAROSCOPY     cyst-near ovary  . LITHOTRIPSY     "2-3 times prior to 2002" (05/15/2013)  . SPINAL FUSION N/A 05/08/2013   Procedure: T9-S1 INSTRUMENTED FUSION T12 -S1 DECOMPRESSION;  Surgeon: Melina Schools, MD;  Location: [facility];  Service: Orthopedics;  Laterality: N/A;  . TEE WITHOUT CARDIOVERSION N/A 11/06/2020   Procedure: TRANSESOPHAGEAL ECHOCARDIOGRAM (TEE);  Surgeon: Fay Records, MD;  Location: [facility];  Service: Cardiovascular;  Laterality: N/A;  . TEE WITHOUT CARDIOVERSION N/A 11/24/2020   Procedure: TRANSESOPHAGEAL ECHOCARDIOGRAM (TEE);  Surgeon: Geralynn Rile, MD;  Location: [facility];  Service: Cardiovascular;  Laterality: N/A;  . TUBAL LIGATION Bilateral 1985     OB History   No obstetric history on file.     Family History  Problem Relation Age of Onset  . COPD Mother   . Heart disease Mother   . Breast cancer Mother   . Kidney cancer Father   . Liver cancer Brother   . Environmental Allergies Other   . Lupus Other        niece  . Environmental Allergies Son     Social History   Tobacco Use  . Smoking status: Never Smoker  . Smokeless tobacco: Never Used  . Tobacco comment: Mother & Father smoked  Vaping Use  . Vaping Use: Never used  Substance Use Topics  . Alcohol use: No    Alcohol/week: 0.0 standard drinks  . Drug use: No    Home Medications Prior to Admission medications   Medication Sig Start Date End Date Taking? Authorizing Provider  acetaminophen (TYLENOL) 500 MG tablet Take 500 mg by mouth every 6 (six) hours as needed for mild pain or headache.     [provider]  apixaban (ELIQUIS) 5 MG  TABS tablet Take 5 mg by mouth 2 (two) times  daily.    [provider]  Ascorbic Acid (VITAMIN C) 1000 MG tablet Take 1,000 mg by mouth in the morning.    [provider]  atorvastatin (LIPITOR) 10 MG tablet Take 10 mg by mouth at bedtime.    [provider]  carvedilol (COREG) 6.25 MG tablet Take 6.25 mg by mouth 2 (two) times daily with a meal. Patient will take 1 tablet (6.25 mg) by mouth 3 times daily if in afib    [provider]  cholecalciferol (VITAMIN D) 25 MCG (1000 UNIT) tablet Take 1,000 Units by mouth in the morning.    [provider]  fluticasone (FLONASE) 50 MCG/ACT nasal spray Place 1 spray into both nostrils daily as needed for allergies or rhinitis.    [provider]  furosemide (LASIX) 20 MG tablet Take 20 mg by mouth daily as needed for edema (weight gain of 2 lbs or more x 2 days).    [provider]  glipiZIDE (GLUCOTROL XL) 10 MG 24 hr tablet Take 10 mg by mouth in the morning and at bedtime.  07/11/19   [provider]  glucagon (GLUCAGON EMERGENCY) 1 MG injection Inject 1 mg into the muscle as needed (When glucose drop low levels). 03/21/19   [provider]  loratadine (CLARITIN) 10 MG tablet Take 10 mg by mouth in the morning.    [provider]  montelukast (SINGULAIR) 10 MG tablet Take 10 mg by mouth at bedtime.    [provider]  omeprazole (PRILOSEC) 20 MG capsule Take 20 mg by mouth in the morning. 03/31/13   [provider]  OVER THE COUNTER MEDICATION Take 2 capsules by mouth 2 (two) times daily. Glucosil for blood sugar    [provider]  sertraline (ZOLOFT) 50 MG tablet Take 50 mg by mouth in the morning. 10/08/19   [provider]  Zinc 50 MG TABS Take 50 mg by mouth in the morning.    [provider]    Allergies    Pneumococcal vaccines, Cleocin [clindamycin hcl], Morphine and related, Other, Shellfish-derived products, Ativan  [lorazepam], and Buprenorphine hcl  Review of Systems   Review of Systems  All other systems reviewed and are negative.   Physical Exam Updated Vital Signs BP 130/81   Pulse 91   Temp 99.6 F (37.6 C)   Resp 19   SpO2 100%   Physical Exam Vitals and nursing note reviewed.  Constitutional:      Appearance: She is well-developed.  HENT:     Head: Normocephalic and atraumatic.  Cardiovascular:     Rate and Rhythm: Tachycardia present. Rhythm irregular.     Heart sounds: No murmur heard.   Pulmonary:     Effort: Pulmonary effort is normal. No respiratory distress.     Breath sounds: Normal breath sounds.  Abdominal:     Palpations: Abdomen is soft.     Tenderness: There is abdominal tenderness. There is no guarding or rebound.     Comments: Moderate RLQ tenderness  Musculoskeletal:        General: No swelling or tenderness.  Skin:    General: Skin is warm and dry.  Neurological:     Mental Status: She is alert and oriented to person, place, and time.  Psychiatric:        Behavior: Behavior normal.     ED Results / Procedures / Treatments   Labs (all labs ordered are listed, but only abnormal results are displayed)  Labs Reviewed  BASIC METABOLIC PANEL - Abnormal; Notable for the following components:      Result Value   CO2 19 (*)    Glucose, Bld 260 (*)    Creatinine, Ser 1.47 (*)    Calcium 8.4 (*)    GFR, Estimated 39 (*)    All other components within normal limits  CBC - Abnormal; Notable for the following components:   WBC 11.5 (*)    RBC 2.52 (*)    Hemoglobin 7.9 (*)    HCT 25.9 (*)    MCV 102.8 (*)    RDW 17.8 (*)    All other components within normal limits  MAGNESIUM - Abnormal; Notable for the following components:   Magnesium 1.1 (*)    All other components within normal limits  HEMOGLOBIN AND HEMATOCRIT, BLOOD - Abnormal; Notable for the following components:   Hemoglobin 7.8 (*)    HCT 25.3 (*)    All other components within normal  limits  SARS CORONAVIRUS 2 (TAT 6-24 HRS)  HEMOGLOBIN  CBC  BASIC METABOLIC PANEL  HEMOGLOBIN A1C  POC OCCULT BLOOD, ED  TYPE AND SCREEN  PREPARE RBC (CROSSMATCH)    EKG EKG Interpretation  Date/Time:  Saturday December 05 2020 16:08:19 EDT Ventricular Rate:  113 PR Interval:    QRS Duration: 92 QT Interval:  324 QTC Calculation: 445 R Axis:   -16 Text Interpretation: Atrial fibrillation Borderline left axis deviation Low voltage, precordial leads Borderline repolarization abnormality Confirmed by Quintella Reichert [phone]) on 12/05/2020 4:27:01 PM   Radiology CT Abdomen Pelvis Wo Contrast  Result Date: 12/05/2020 CLINICAL DATA:  Anemia, recent cardiac ablation. Evaluate for retroperitoneal bleed. EXAM: CT ABDOMEN AND PELVIS WITHOUT CONTRAST TECHNIQUE: Multidetector CT imaging of the abdomen and pelvis was performed following the standard protocol without IV contrast. COMPARISON:  CT abdomen dated 09/26/2018 FINDINGS: Lower chest: No acute abnormality. Hepatobiliary: No focal liver abnormality is seen. No gallstones, gallbladder wall thickening, or biliary dilatation. Pancreas: Unremarkable. No pancreatic ductal dilatation or surrounding inflammatory changes. Spleen: Normal in size without focal abnormality. Adrenals/Urinary Tract: Adrenal glands appear normal. Kidneys are unremarkable without hydronephrosis or perinephric fluid/hemorrhage. Punctate nonobstructing RIGHT renal stone. No ureteral or bladder calculi. Bladder is partially decompressed, displaced to the LEFT pelvis by acute hemorrhage in the RIGHT hemipelvis. Stomach/Bowel: No dilated large or small bowel loops. No evidence of bowel wall inflammation. Appendix is normal. Stomach is unremarkable, partially decompressed. Vascular/Lymphatic: Aortic atherosclerosis. No enlarged lymph nodes are seen. Reproductive: Partially calcified fibroids within the otherwise normal appearing uterus. LEFT adnexal regions unremarkable. Other: Large acute  hematoma within the RIGHT hemipelvis, extending from the RIGHT pelvic sidewall to the midline pelvis, measuring 10.5 x 8 x 9.1 cm, displacing the bladder to the LEFT hemipelvis. Additional fluid stranding/hemorrhage within the more anterior pelvis. Musculoskeletal: No acute-appearing osseous abnormality. Surgical changes of central canal decompression of the lower lumbar spine with associated fixation hardware in place throughout the lumbar spine. Superficial/subcutaneous soft tissues are unremarkable. IMPRESSION: 1. Large acute-appearing hematoma within the RIGHT hemipelvis, extending from the RIGHT pelvic sidewall to the midline pelvis, measuring 10.5 x 8 x 9.1 cm, displacing the bladder to the LEFT hemipelvis. Additional fluid stranding/hemorrhage within the more anterior pelvis. Consider CT angiogram to exclude active hemorrhage. 2. Punctate nonobstructing RIGHT renal stone. No evidence of ureteral or bladder calculi. No hydronephrosis. 3. Additional chronic/incidental findings detailed above. Aortic Atherosclerosis (ICD10-I70.0). Critical Value/emergent results were called by telephone at the time of interpretation on 12/05/2020 at  6:15 pm to provider [facility] , who verbally acknowledged these results. Electronically Signed   By: Franki Cabot M.D.   On: 12/05/2020 18:17   DG Chest Port 1 View  Result Date: 12/05/2020 CLINICAL DATA:  Shortness of breath, recent cardiac ablation. EXAM: PORTABLE CHEST 1 VIEW COMPARISON:  Chest radiograph dated 09/01/2020. FINDINGS: The heart is enlarged. Vascular calcifications are seen in the aortic arch. There is mild left basilar atelectasis. The right lung is clear. There is no pleural effusion or pneumothorax. Spinal fixation hardware is redemonstrated. IMPRESSION: Mild left basilar atelectasis. Aortic Atherosclerosis (ICD10-I70.0). Electronically Signed   By: Zerita Boers M.D.   On: 12/05/2020 16:35    Procedures Procedures  CRITICAL CARE Performed by:  Quintella Reichert   Total critical care time: 45 minutes  Critical care time was exclusive of separately billable procedures and treating other patients.  Critical care was necessary to treat or prevent imminent or life-threatening deterioration.  Critical care was time spent personally by me on the following activities: development of treatment plan with patient and/or surrogate as well as nursing, discussions with consultants, evaluation of patient's response to treatment, examination of patient, obtaining history from patient or surrogate, ordering and performing treatments and interventions, ordering and review of laboratory studies, ordering and review of radiographic studies, pulse oximetry and re-evaluation of patient's condition.  Medications Ordered in ED Medications  0.9 %  sodium chloride infusion (Manually program via Guardrails IV Fluids) (has no administration in time range)  insulin aspart (novoLOG) injection 0-15 Units (has no administration in time range)  insulin aspart (novoLOG) injection 0-5 Units (has no administration in time range)  magnesium sulfate IVPB 2 g 50 mL (0 g Intravenous Stopped 12/05/20 1830)    ED Course  I have reviewed the triage vital signs and the nursing notes.  Pertinent labs & imaging results that were available during my care of the patient were reviewed by me and considered in my medical decision making (see chart for details).    MDM Rules/Calculators/A&P                         Patient with history of a fib status post recent ablation here for evaluation of shortness of breath and generalized weakness. Patient with a fib with RVR on ED presentation. Labs significant for anemia when compared to priors as well as hypomag. Patient denies any hematochezia or melena at home. Rectal examination with light brown stool, he negative. CT abdomen pelvis was obtained, which demonstrates large pelvic hematoma. Discussed with Dr. Oneida Alar with vascular surgery,  will see the patient in consult. Cardiology consulted for admission for ongoing treatment.  Final Clinical Impression(s) / ED Diagnoses Final diagnoses:  Pelvic hematoma in female  Atrial fibrillation with RVR ([place])  Hypomagnesemia    Rx / DC Orders ED Discharge Orders    None       Quintella Reichert, MD 12/05/20 2327

## 2020-12-06 DIAGNOSIS — R58 Hemorrhage, not elsewhere classified: Secondary | ICD-10-CM

## 2020-12-06 DIAGNOSIS — I4891 Unspecified atrial fibrillation: Secondary | ICD-10-CM

## 2020-12-06 LAB — TYPE AND SCREEN
ABO/RH(D): A POS
Antibody Screen: NEGATIVE
Unit division: 0

## 2020-12-06 LAB — GLUCOSE, CAPILLARY
Glucose-Capillary: 149 mg/dL — ABNORMAL HIGH (ref 70–99)
Glucose-Capillary: 211 mg/dL — ABNORMAL HIGH (ref 70–99)
Glucose-Capillary: 191 mg/dL — ABNORMAL HIGH (ref 70–99)
Glucose-Capillary: 330 mg/dL — ABNORMAL HIGH (ref 70–99)

## 2020-12-06 LAB — BPAM RBC
Blood Product Expiration Date: 202206262359
ISSUE DATE / TIME: 202206042135
Unit Type and Rh: 6200

## 2020-12-06 LAB — CBC
HCT: 27.3 % — ABNORMAL LOW (ref 36.0–46.0)
Hemoglobin: 8.9 g/dL — ABNORMAL LOW (ref 12.0–15.0)
MCH: 31.2 pg (ref 26.0–34.0)
MCHC: 32.6 g/dL (ref 30.0–36.0)
MCV: 95.8 fL (ref 80.0–100.0)
Platelets: 309 10*3/uL (ref 150–400)
RBC: 2.85 MIL/uL — ABNORMAL LOW (ref 3.87–5.11)
RDW: 18.4 % — ABNORMAL HIGH (ref 11.5–15.5)
WBC: 10.5 10*3/uL (ref 4.0–10.5)
nRBC: 0.2 % (ref 0.0–0.2)

## 2020-12-06 LAB — MAGNESIUM: Magnesium: 1.5 mg/dL — ABNORMAL LOW (ref 1.7–2.4)

## 2020-12-06 LAB — MRSA PCR SCREENING: MRSA by PCR: NEGATIVE

## 2020-12-06 LAB — BASIC METABOLIC PANEL
Anion gap: 8 (ref 5–15)
BUN: 21 mg/dL (ref 8–23)
CO2: 22 mmol/L (ref 22–32)
Calcium: 9 mg/dL (ref 8.9–10.3)
Chloride: 107 mmol/L (ref 98–111)
Creatinine, Ser: 1.22 mg/dL — ABNORMAL HIGH (ref 0.44–1.00)
GFR, Estimated: 49 mL/min — ABNORMAL LOW (ref >60)
Glucose, Bld: 141 mg/dL — ABNORMAL HIGH (ref 70–99)
Potassium: 4.5 mmol/L (ref 3.5–5.1)
Sodium: 137 mmol/L (ref 135–145)

## 2020-12-06 LAB — HIV ANTIBODY (ROUTINE TESTING W REFLEX): HIV Screen 4th Generation wRfx: NONREACTIVE

## 2020-12-06 LAB — HEMOGLOBIN AND HEMATOCRIT, BLOOD
HCT: 27.8 % — ABNORMAL LOW (ref 36.0–46.0)
Hemoglobin: 8.8 g/dL — ABNORMAL LOW (ref 12.0–15.0)

## 2020-12-06 MED ORDER — LORATADINE 10 MG PO TABS
10.0000 mg | ORAL_TABLET | Freq: Every day | ORAL | Status: DC
  Administered 2020-12-06 – 2020-12-08 (×3): 10 mg via ORAL
  Filled 2020-12-06 (×3): qty 1

## 2020-12-06 MED ORDER — SERTRALINE HCL 25 MG PO TABS
50.0000 mg | ORAL_TABLET | Freq: Every day | ORAL | Status: DC
  Administered 2020-12-06 – 2020-12-08 (×3): 50 mg via ORAL
  Filled 2020-12-06 (×3): qty 2

## 2020-12-06 MED ORDER — AMIODARONE HCL IN DEXTROSE 360-4.14 MG/200ML-% IV SOLN
60.0000 mg/h | INTRAVENOUS | Status: AC
  Administered 2020-12-06: 60 mg/h via INTRAVENOUS
  Filled 2020-12-06: qty 200

## 2020-12-06 MED ORDER — ATORVASTATIN CALCIUM 10 MG PO TABS
10.0000 mg | ORAL_TABLET | Freq: Every day | ORAL | Status: DC
  Administered 2020-12-06 – 2020-12-07 (×2): 10 mg via ORAL
  Filled 2020-12-06 (×2): qty 1

## 2020-12-06 MED ORDER — PANTOPRAZOLE SODIUM 40 MG PO TBEC
40.0000 mg | DELAYED_RELEASE_TABLET | Freq: Every day | ORAL | Status: DC
  Administered 2020-12-06 – 2020-12-08 (×3): 40 mg via ORAL
  Filled 2020-12-06 (×4): qty 1

## 2020-12-06 MED ORDER — MONTELUKAST SODIUM 10 MG PO TABS
10.0000 mg | ORAL_TABLET | Freq: Every day | ORAL | Status: DC
  Administered 2020-12-06 – 2020-12-07 (×2): 10 mg via ORAL
  Filled 2020-12-06 (×2): qty 1

## 2020-12-06 MED ORDER — ONDANSETRON HCL 4 MG/2ML IJ SOLN: 4.0000 mg | Freq: Four times a day (QID) | INTRAMUSCULAR | Status: DC | PRN

## 2020-12-06 MED ORDER — SODIUM CHLORIDE 0.9 % IV SOLN: 250.0000 mL | INTRAVENOUS | Status: DC | PRN

## 2020-12-06 MED ORDER — AMIODARONE HCL IN DEXTROSE 360-4.14 MG/200ML-% IV SOLN
30.0000 mg/h | INTRAVENOUS | Status: DC
  Administered 2020-12-06: 30 mg/h via INTRAVENOUS
  Filled 2020-12-06 (×2): qty 200

## 2020-12-06 MED ORDER — ACETAMINOPHEN 325 MG PO TABS
650.0000 mg | ORAL_TABLET | ORAL | Status: DC | PRN
  Administered 2020-12-06: 650 mg via ORAL
  Filled 2020-12-06: qty 2

## 2020-12-06 MED ORDER — SODIUM CHLORIDE 0.9% FLUSH
3.0000 mL | Freq: Two times a day (BID) | INTRAVENOUS | Status: DC
  Administered 2020-12-06 – 2020-12-08 (×5): 3 mL via INTRAVENOUS

## 2020-12-06 MED ORDER — MAGNESIUM SULFATE 2 GM/50ML IV SOLN
2.0000 g | Freq: Once | INTRAVENOUS | Status: AC
  Administered 2020-12-06: 2 g via INTRAVENOUS
  Filled 2020-12-06: qty 50

## 2020-12-06 MED ORDER — SODIUM CHLORIDE 0.9% FLUSH: 3.0000 mL | INTRAVENOUS | Status: DC | PRN

## 2020-12-06 NOTE — Progress Notes (Signed)
Progress Note  Patient Name: [name] Date of Encounter: 12/06/2020  [facility] Cardiologist: Jenne Campus, MD  Electrophysiologist: Dr. Curt Bears  Subjective   Abdominal tenderness but no pain.  Denies any chest pain or shortness of breath.  Inpatient Medications    Scheduled Meds: . sodium chloride   Intravenous Once  . atorvastatin  10 mg Oral QHS  . insulin aspart  0-15 Units Subcutaneous TID WC  . insulin aspart  0-5 Units Subcutaneous QHS  . loratadine  10 mg Oral Daily  . montelukast  10 mg Oral QHS  . pantoprazole  40 mg Oral Daily  . sertraline  50 mg Oral Daily  . sodium chloride flush  3 mL Intravenous Q12H   Continuous Infusions: . sodium chloride     PRN Meds: sodium chloride, acetaminophen, ondansetron (ZOFRAN) IV, sodium chloride flush   Vital Signs    Vitals:   12/05/20 2257 12/06/20 0104 12/06/20 0300 12/06/20 0700  BP:  123/72 123/76 119/66  Pulse:  92 90 88  Resp:  16 16 18   Temp:  99.1 F (37.3 C) 99.1 F (37.3 C) 98.7 F (37.1 C)  TempSrc:  Oral Oral Oral  SpO2:  97% 95% 94%  Weight: 72.5 kg     Height: 4\' 11"  (1.499 m)       Intake/Output Summary (Last 24 hours) at 12/06/2020 0811 Last data filed at 12/06/2020 0104 Gross per 24 hour  Intake 252.5 ml  Output --  Net 252.5 ml   Last 3 Weights 12/05/2020 11/25/2020 11/20/2020  Weight (lbs) 159 lb 13.3 oz 153 lb 155 lb  Weight (kg) 72.5 kg 69.4 kg 70.308 kg      Telemetry    Sinus rhythm.  Converted from atrial fibrillation around 6 PM on 12/05/2020- Personally Reviewed  ECG    12/05/2020: Sinus rhythm.  Rate 92 bpm.  Nonspecific ST abnormalities - Personally Reviewed  Physical Exam   VS:  BP 119/66   Pulse 88   Temp 98.7 F (37.1 C) (Oral)   Resp 18   Ht 4\' 11"  (1.499 m)   Wt 72.5 kg   SpO2 94%   BMI 32.28 kg/m  , BMI Body mass index is 32.28 kg/m. GENERAL:  Well appearing HEENT: Pupils equal round and reactive, fundi not visualized, oral mucosa  unremarkable NECK:  No jugular venous distention, waveform within normal limits, carotid upstroke brisk and symmetric, no bruits LUNGS:  Clear to auscultation bilaterally HEART:  RRR.  PMI not displaced or sustained,S1 and S2 within normal limits, no S3, no S4, no clicks, no rubs, no murmurs ABD: +Abdominal tenderness to palpation EXT:  2 plus pulses throughout, no edema, no cyanosis no clubbing SKIN:  No rashes no nodules NEURO:  Cranial nerves II through XII grossly intact, motor grossly intact throughout PSYCH:  Cognitively intact, oriented to person place and time   Labs    High Sensitivity Troponin:  No results for input(s): TROPONINIHS in the last 720 hours.    Chemistry Recent Labs  Lab 12/05/20 1548 12/06/20 0450  NA 136 137  K 4.4 4.5  CL 108 107  CO2 19* 22  GLUCOSE 260* 141*  BUN 22 21  CREATININE 1.47* 1.22*  CALCIUM 8.4* 9.0  GFRNONAA 39* 49*  ANIONGAP 9 8     Hematology Recent Labs  Lab 12/05/20 1548 12/05/20 1940 12/06/20 0450  WBC 11.5*  --  10.5  RBC 2.52*  --  2.85*  HGB 7.9* 7.8* 8.9*  HCT 25.9* 25.3* 27.3*  MCV 102.8*  --  95.8  MCH 31.3  --  31.2  MCHC 30.5  --  32.6  RDW 17.8*  --  18.4*  PLT 353  --  309    BNPNo results for input(s): BNP, PROBNP in the last 168 hours.   DDimer No results for input(s): DDIMER in the last 168 hours.   Radiology    CT Abdomen Pelvis Wo Contrast  Result Date: 12/05/2020 CLINICAL DATA:  Anemia, recent cardiac ablation. Evaluate for retroperitoneal bleed. EXAM: CT ABDOMEN AND PELVIS WITHOUT CONTRAST TECHNIQUE: Multidetector CT imaging of the abdomen and pelvis was performed following the standard protocol without IV contrast. COMPARISON:  CT abdomen dated 09/26/2018 FINDINGS: Lower chest: No acute abnormality. Hepatobiliary: No focal liver abnormality is seen. No gallstones, gallbladder wall thickening, or biliary dilatation. Pancreas: Unremarkable. No pancreatic ductal dilatation or surrounding inflammatory  changes. Spleen: Normal in size without focal abnormality. Adrenals/Urinary Tract: Adrenal glands appear normal. Kidneys are unremarkable without hydronephrosis or perinephric fluid/hemorrhage. Punctate nonobstructing RIGHT renal stone. No ureteral or bladder calculi. Bladder is partially decompressed, displaced to the LEFT pelvis by acute hemorrhage in the RIGHT hemipelvis. Stomach/Bowel: No dilated large or small bowel loops. No evidence of bowel wall inflammation. Appendix is normal. Stomach is unremarkable, partially decompressed. Vascular/Lymphatic: Aortic atherosclerosis. No enlarged lymph nodes are seen. Reproductive: Partially calcified fibroids within the otherwise normal appearing uterus. LEFT adnexal regions unremarkable. Other: Large acute hematoma within the RIGHT hemipelvis, extending from the RIGHT pelvic sidewall to the midline pelvis, measuring 10.5 x 8 x 9.1 cm, displacing the bladder to the LEFT hemipelvis. Additional fluid stranding/hemorrhage within the more anterior pelvis. Musculoskeletal: No acute-appearing osseous abnormality. Surgical changes of central canal decompression of the lower lumbar spine with associated fixation hardware in place throughout the lumbar spine. Superficial/subcutaneous soft tissues are unremarkable. IMPRESSION: 1. Large acute-appearing hematoma within the RIGHT hemipelvis, extending from the RIGHT pelvic sidewall to the midline pelvis, measuring 10.5 x 8 x 9.1 cm, displacing the bladder to the LEFT hemipelvis. Additional fluid stranding/hemorrhage within the more anterior pelvis. Consider CT angiogram to exclude active hemorrhage. 2. Punctate nonobstructing RIGHT renal stone. No evidence of ureteral or bladder calculi. No hydronephrosis. 3. Additional chronic/incidental findings detailed above. Aortic Atherosclerosis (ICD10-I70.0). Critical Value/emergent results were called by telephone at the time of interpretation on 12/05/2020 at 6:15 pm to provider [facility]  , who verbally acknowledged these results. Electronically Signed   By: Franki Cabot M.D.   On: 12/05/2020 18:17   DG Chest Port 1 View  Result Date: 12/05/2020 CLINICAL DATA:  Shortness of breath, recent cardiac ablation. EXAM: PORTABLE CHEST 1 VIEW COMPARISON:  Chest radiograph dated 09/01/2020. FINDINGS: The heart is enlarged. Vascular calcifications are seen in the aortic arch. There is mild left basilar atelectasis. The right lung is clear. There is no pleural effusion or pneumothorax. Spinal fixation hardware is redemonstrated. IMPRESSION: Mild left basilar atelectasis. Aortic Atherosclerosis (ICD10-I70.0). Electronically Signed   By: Zerita Boers M.D.   On: 12/05/2020 16:35    Cardiac Studies   10/06/20 Echo: 1. Left ventricular ejection fraction, by estimation, is 40 to 45%. The  left ventricle has mildly decreased function. The left ventricle  demonstrates global hypokinesis. There is moderate concentric left  ventricular hypertrophy. Left ventricular  diastolic parameters are indeterminate. The average left ventricular  global longitudinal strain is -9.0 %. The global longitudinal strain is  abnormal.  2. Right ventricular systolic function is normal. The right ventricular  size is normal. There is moderately elevated pulmonary artery systolic  pressure.  3. Left atrial size was mildly dilated.  4. The mitral valve is calcified. There is moderate posterior mitral  annular calcification. Moderate to severe mitral valve regurgitation. MR  vol 63 ml, ERO 0.42 cmsq. No evidence of mitral stenosis.  5. Tricuspid valve regurgitation is moderate to severe.  6. The aortic valve is normal in structure. Aortic valve regurgitation is  not visualized. No aortic stenosis is present.  7. Abdominal aorta is mildly dilated (2.3 cm).  8. The inferior vena cava is normal in size with greater than 50%  respiratory variability, suggesting right atrial pressure of 3 mmHg.   Patient Profile      65 y.o. female with persistent atrial fibrillation s/p PVI 11/25/2020, hypertension, hyperlipidemia, diabetes admitted with retroperitoneal bleed.  Assessment & Plan    # RP bleed:  Admitted with a large, acute hematoma in the right hemipelvis.  Measuring 11 x 8 x 9 cm.  She has been seen by vascular surgery who recommends conservative management.  She was given 1 unit of packed red blood cells.  H/H has increased appropriately.  Holding Eliquis.  She did not get a CT-A due to acute on chronic renal failure.  However she seems to be stable so no further imaging at this time unless directed by vascular.  Repeat H/H today at 10 AM.  # Persistent atrial fibrillation:  Underwent PVI 11/25/2020.  She developed recurrent atrial fibrillation and has been feeling weak and tired.  She was in AF with RVR in the 120s on admission.  She converted back to sinus rhythm after receiving magnesium.  Magnesium was 1.1 on admission.  Eliquis on hold as above.  Carvedilol is on hold.  # Acute on chronic renal failure:  Renal function improving with IVF and pRBC.   #Hyperlipidemia: Continue atorvastatin.  #Essential hypertension: Home carvedilol is on hold.  #Diabetes mellitus: Insulin sliding scale.  Home glipizide is on hold.       For questions or updates, please contact [place] Please consult [email or web address] for contact info under        Signed, Skeet Latch, MD  12/06/2020, 8:11 AM

## 2020-12-06 NOTE — Plan of Care (Signed)
  Problem: Education: Goal: Knowledge of General Education information will improve Description: Including pain rating scale, medication(s)/side effects and non-pharmacologic comfort measures Outcome: Progressing   Problem: Clinical Measurements: Goal: Diagnostic test results will improve Outcome: Progressing Goal: Cardiovascular complication will be avoided Outcome: Progressing   Problem: Pain Managment: Goal: General experience of comfort will improve Outcome: Progressing

## 2020-12-06 NOTE — Progress Notes (Signed)
    Contacted by patient's nurse that she went back into atrial fibrillation with RVR. Her PTA Coreg has been held due to episodes of hypotension in the setting of an RP bleed. Reviewed with Dr. Domenic Polite and will plan to start IV Amiodarone for rhythm control given her recurrent episodes. Mg was low at 1.5 this afternoon so will order additional replacement as well.   Signed, Erma Heritage, PA-C 12/06/2020, 5:06 PM Pager: [phone]

## 2020-12-06 NOTE — Progress Notes (Signed)
Pt arrived to unit. Vitals stable. Placed on tele.

## 2020-12-06 NOTE — Progress Notes (Signed)
[facility]  Subjective  - feels ok  Objective 119/66 88 98.7 F (37.1 C) (Oral) 18 94%  Intake/Output Summary (Last 24 hours) at 12/06/2020 0809 Last data filed at 12/06/2020 0104 Gross per 24 hour  Intake 252.5 ml  Output --  Net 252.5 ml   Abdomen mass in right lower quadrant unchanged maybe slightly less tender  Assessment/Planning: Right retroperitoneal hematoma stable overnight hemoglobin rise correlated with 1 U RBC Would leave long term Eliquis management to cardiology but I would not restart this for at least one week  Ok for diet this morning  Can d/c home tomorrow from my standpoint if hemoglobin remains stable  Ruta Hinds 12/06/2020 8:09 AM --  Laboratory Lab Results: Recent Labs    12/05/20 1548 12/05/20 1940 12/06/20 0450  WBC 11.5*  --  10.5  HGB 7.9* 7.8* 8.9*  HCT 25.9* 25.3* 27.3*  PLT 353  --  309   BMET Recent Labs    12/05/20 1548 12/06/20 0450  NA 136 137  K 4.4 4.5  CL 108 107  CO2 19* 22  GLUCOSE 260* 141*  BUN 22 21  CREATININE 1.47* 1.22*  CALCIUM 8.4* 9.0    COAG Lab Results  Component Value Date   INR 1.5 (H) 10/01/2018   INR 1.7 (H) 09/26/2018   INR 1.7 (H) 09/24/2018   No results found for: PTT

## 2020-12-07 LAB — CBC WITH DIFFERENTIAL/PLATELET
Abs Immature Granulocytes: 0.05 10*3/uL (ref 0.00–0.07)
Basophils Absolute: 0 10*3/uL (ref 0.0–0.1)
Basophils Relative: 0 %
Eosinophils Absolute: 0.1 10*3/uL (ref 0.0–0.5)
Eosinophils Relative: 1 %
HCT: 30 % — ABNORMAL LOW (ref 36.0–46.0)
Hemoglobin: 9.8 g/dL — ABNORMAL LOW (ref 12.0–15.0)
Immature Granulocytes: 1 %
Lymphocytes Relative: 20 %
Lymphs Abs: 2 10*3/uL (ref 0.7–4.0)
MCH: 31.1 pg (ref 26.0–34.0)
MCHC: 32.7 g/dL (ref 30.0–36.0)
MCV: 95.2 fL (ref 80.0–100.0)
Monocytes Absolute: 0.7 10*3/uL (ref 0.1–1.0)
Monocytes Relative: 7 %
Neutro Abs: 7.5 10*3/uL (ref 1.7–7.7)
Neutrophils Relative %: 71 %
Platelets: DECREASED 10*3/uL (ref 150–400)
RBC: 3.15 MIL/uL — ABNORMAL LOW (ref 3.87–5.11)
RDW: 18 % — ABNORMAL HIGH (ref 11.5–15.5)
WBC: 10.4 10*3/uL (ref 4.0–10.5)
nRBC: 0.2 % (ref 0.0–0.2)

## 2020-12-07 LAB — HEMOGLOBIN A1C
Hgb A1c MFr Bld: 8.2 % — ABNORMAL HIGH (ref 4.8–5.6)
Mean Plasma Glucose: 189 mg/dL

## 2020-12-07 LAB — CBC
HCT: 30.2 % — ABNORMAL LOW (ref 36.0–46.0)
Hemoglobin: 9.9 g/dL — ABNORMAL LOW (ref 12.0–15.0)
MCH: 31.2 pg (ref 26.0–34.0)
MCHC: 32.8 g/dL (ref 30.0–36.0)
MCV: 95.3 fL (ref 80.0–100.0)
Platelets: 326 10*3/uL (ref 150–400)
RBC: 3.17 MIL/uL — ABNORMAL LOW (ref 3.87–5.11)
RDW: 17.7 % — ABNORMAL HIGH (ref 11.5–15.5)
WBC: 11.1 10*3/uL — ABNORMAL HIGH (ref 4.0–10.5)
nRBC: 0.2 % (ref 0.0–0.2)

## 2020-12-07 LAB — BASIC METABOLIC PANEL
Anion gap: 12 (ref 5–15)
BUN: 28 mg/dL — ABNORMAL HIGH (ref 8–23)
CO2: 19 mmol/L — ABNORMAL LOW (ref 22–32)
Calcium: 9 mg/dL (ref 8.9–10.3)
Chloride: 106 mmol/L (ref 98–111)
Creatinine, Ser: 1.62 mg/dL — ABNORMAL HIGH (ref 0.44–1.00)
GFR, Estimated: 35 mL/min — ABNORMAL LOW (ref >60)
Glucose, Bld: 226 mg/dL — ABNORMAL HIGH (ref 70–99)
Potassium: 4.5 mmol/L (ref 3.5–5.1)
Sodium: 137 mmol/L (ref 135–145)

## 2020-12-07 LAB — GLUCOSE, CAPILLARY
Glucose-Capillary: 262 mg/dL — ABNORMAL HIGH (ref 70–99)
Glucose-Capillary: 245 mg/dL — ABNORMAL HIGH (ref 70–99)
Glucose-Capillary: 263 mg/dL — ABNORMAL HIGH (ref 70–99)
Glucose-Capillary: 198 mg/dL — ABNORMAL HIGH (ref 70–99)

## 2020-12-07 LAB — MAGNESIUM: Magnesium: 1.9 mg/dL (ref 1.7–2.4)

## 2020-12-07 MED ORDER — AMIODARONE HCL 200 MG PO TABS
400.0000 mg | ORAL_TABLET | Freq: Two times a day (BID) | ORAL | Status: DC
  Administered 2020-12-07 – 2020-12-08 (×3): 400 mg via ORAL
  Filled 2020-12-07 (×3): qty 2

## 2020-12-07 MED ORDER — MAGNESIUM SULFATE IN D5W 1-5 GM/100ML-% IV SOLN
1.0000 g | Freq: Once | INTRAVENOUS | Status: AC
  Administered 2020-12-07: 1 g via INTRAVENOUS
  Filled 2020-12-07: qty 100

## 2020-12-07 NOTE — Progress Notes (Signed)
Progress Note  Patient Name: [name] Date of Encounter: 12/07/2020  [facility] Cardiologist: Jenne Campus, MD  Electrophysiologist: Dr. Curt Bears  Subjective   Right lower abdominal tenderness is much improved.   Denies any chest pain or shortness of breath. Feels lethargic with Afib.   Inpatient Medications    Scheduled Meds: . sodium chloride   Intravenous Once  . atorvastatin  10 mg Oral QHS  . insulin aspart  0-15 Units Subcutaneous TID WC  . insulin aspart  0-5 Units Subcutaneous QHS  . loratadine  10 mg Oral Daily  . montelukast  10 mg Oral QHS  . pantoprazole  40 mg Oral Daily  . sertraline  50 mg Oral Daily  . sodium chloride flush  3 mL Intravenous Q12H   Continuous Infusions: . sodium chloride    . amiodarone 30 mg/hr (12/07/20 0347)   PRN Meds: sodium chloride, acetaminophen, ondansetron (ZOFRAN) IV, sodium chloride flush   Vital Signs    Vitals:   12/06/20 1927 12/06/20 2308 12/07/20 0339 12/07/20 0734  BP: 101/73 (!) 155/64 (!) 110/93 106/74  Pulse: 100 (!) 113 (!) 102 (!) 109  Resp: 15 20 18 20   Temp: 98.7 F (37.1 C) 98.8 F (37.1 C) 98.9 F (37.2 C) 97.7 F (36.5 C)  TempSrc: Oral Oral Oral Oral  SpO2: 99% 99% 96% 95%  Weight:      Height:        Intake/Output Summary (Last 24 hours) at 12/07/2020 0908 Last data filed at 12/07/2020 0600 Gross per 24 hour  Intake 460.76 ml  Output 1850 ml  Net -1389.24 ml   Last 3 Weights 12/05/2020 11/25/2020 11/20/2020  Weight (lbs) 159 lb 13.3 oz 153 lb 155 lb  Weight (kg) 72.5 kg 69.4 kg 70.308 kg      Telemetry    AFib rate 100-120 bpm. Went into Afib yesterday afternoon- Personally Reviewed  ECG    12/05/2020: Sinus rhythm.  Rate 92 bpm.  Nonspecific ST abnormalities - Personally Reviewed  Physical Exam   VS:  BP 106/74   Pulse (!) 109   Temp 97.7 F (36.5 C) (Oral)   Resp 20   Ht 4\' 11"  (1.499 m)   Wt 72.5 kg   SpO2 95%   BMI 32.28 kg/m  , BMI Body mass index is 32.28  kg/m. GENERAL:  Well appearing HEENT: Pupils equal round and reactive, fundi not visualized, oral mucosa unremarkable NECK:  No jugular venous distention, waveform within normal limits, carotid upstroke brisk and symmetric, no bruits LUNGS:  Clear to auscultation bilaterally HEART:  IRRR.  PMI not displaced or sustained,S1 and S2 within normal limits, no S3, no S4, no clicks, no rubs, no murmurs ABD: + mild Abdominal tenderness to palpation EXT:  2 plus pulses throughout, no edema, no cyanosis no clubbing SKIN:  No rashes no nodules NEURO:  Cranial nerves II through XII grossly intact, motor grossly intact throughout PSYCH:  Cognitively intact, oriented to person place and time   Labs    High Sensitivity Troponin:  No results for input(s): TROPONINIHS in the last 720 hours.    Chemistry Recent Labs  Lab 12/05/20 1548 12/06/20 0450 12/07/20 0159  NA 136 137 137  K 4.4 4.5 4.5  CL 108 107 106  CO2 19* 22 19*  GLUCOSE 260* 141* 226*  BUN 22 21 28*  CREATININE 1.47* 1.22* 1.62*  CALCIUM 8.4* 9.0 9.0  GFRNONAA 39* 49* 35*  ANIONGAP 9 8 12  Hematology Recent Labs  Lab 12/06/20 0450 12/06/20 0946 12/07/20 0159 12/07/20 0801  WBC 10.5  --  10.4 11.1*  RBC 2.85*  --  3.15* 3.17*  HGB 8.9* 8.8* 9.8* 9.9*  HCT 27.3* 27.8* 30.0* 30.2*  MCV 95.8  --  95.2 95.3  MCH 31.2  --  31.1 31.2  MCHC 32.6  --  32.7 32.8  RDW 18.4*  --  18.0* 17.7*  PLT 309  --  PLATELET CLUMPS NOTED ON SMEAR, COUNT APPEARS DECREASED 326    BNPNo results for input(s): BNP, PROBNP in the last 168 hours.   DDimer No results for input(s): DDIMER in the last 168 hours.   Radiology    CT Abdomen Pelvis Wo Contrast  Result Date: 12/05/2020 CLINICAL DATA:  Anemia, recent cardiac ablation. Evaluate for retroperitoneal bleed. EXAM: CT ABDOMEN AND PELVIS WITHOUT CONTRAST TECHNIQUE: Multidetector CT imaging of the abdomen and pelvis was performed following the standard protocol without IV contrast.  COMPARISON:  CT abdomen dated 09/26/2018 FINDINGS: Lower chest: No acute abnormality. Hepatobiliary: No focal liver abnormality is seen. No gallstones, gallbladder wall thickening, or biliary dilatation. Pancreas: Unremarkable. No pancreatic ductal dilatation or surrounding inflammatory changes. Spleen: Normal in size without focal abnormality. Adrenals/Urinary Tract: Adrenal glands appear normal. Kidneys are unremarkable without hydronephrosis or perinephric fluid/hemorrhage. Punctate nonobstructing RIGHT renal stone. No ureteral or bladder calculi. Bladder is partially decompressed, displaced to the LEFT pelvis by acute hemorrhage in the RIGHT hemipelvis. Stomach/Bowel: No dilated large or small bowel loops. No evidence of bowel wall inflammation. Appendix is normal. Stomach is unremarkable, partially decompressed. Vascular/Lymphatic: Aortic atherosclerosis. No enlarged lymph nodes are seen. Reproductive: Partially calcified fibroids within the otherwise normal appearing uterus. LEFT adnexal regions unremarkable. Other: Large acute hematoma within the RIGHT hemipelvis, extending from the RIGHT pelvic sidewall to the midline pelvis, measuring 10.5 x 8 x 9.1 cm, displacing the bladder to the LEFT hemipelvis. Additional fluid stranding/hemorrhage within the more anterior pelvis. Musculoskeletal: No acute-appearing osseous abnormality. Surgical changes of central canal decompression of the lower lumbar spine with associated fixation hardware in place throughout the lumbar spine. Superficial/subcutaneous soft tissues are unremarkable. IMPRESSION: 1. Large acute-appearing hematoma within the RIGHT hemipelvis, extending from the RIGHT pelvic sidewall to the midline pelvis, measuring 10.5 x 8 x 9.1 cm, displacing the bladder to the LEFT hemipelvis. Additional fluid stranding/hemorrhage within the more anterior pelvis. Consider CT angiogram to exclude active hemorrhage. 2. Punctate nonobstructing RIGHT renal stone. No  evidence of ureteral or bladder calculi. No hydronephrosis. 3. Additional chronic/incidental findings detailed above. Aortic Atherosclerosis (ICD10-I70.0). Critical Value/emergent results were called by telephone at the time of interpretation on 12/05/2020 at 6:15 pm to provider [facility] , who verbally acknowledged these results. Electronically Signed   By: Franki Cabot M.D.   On: 12/05/2020 18:17   DG Chest Port 1 View  Result Date: 12/05/2020 CLINICAL DATA:  Shortness of breath, recent cardiac ablation. EXAM: PORTABLE CHEST 1 VIEW COMPARISON:  Chest radiograph dated 09/01/2020. FINDINGS: The heart is enlarged. Vascular calcifications are seen in the aortic arch. There is mild left basilar atelectasis. The right lung is clear. There is no pleural effusion or pneumothorax. Spinal fixation hardware is redemonstrated. IMPRESSION: Mild left basilar atelectasis. Aortic Atherosclerosis (ICD10-I70.0). Electronically Signed   By: Zerita Boers M.D.   On: 12/05/2020 16:35    Cardiac Studies   10/06/20 Echo: 1. Left ventricular ejection fraction, by estimation, is 40 to 45%. The  left ventricle has mildly decreased function. The left  ventricle  demonstrates global hypokinesis. There is moderate concentric left  ventricular hypertrophy. Left ventricular  diastolic parameters are indeterminate. The average left ventricular  global longitudinal strain is -9.0 %. The global longitudinal strain is  abnormal.  2. Right ventricular systolic function is normal. The right ventricular  size is normal. There is moderately elevated pulmonary artery systolic  pressure.  3. Left atrial size was mildly dilated.  4. The mitral valve is calcified. There is moderate posterior mitral  annular calcification. Moderate to severe mitral valve regurgitation. MR  vol 63 ml, ERO 0.42 cmsq. No evidence of mitral stenosis.  5. Tricuspid valve regurgitation is moderate to severe.  6. The aortic valve is normal in  structure. Aortic valve regurgitation is  not visualized. No aortic stenosis is present.  7. Abdominal aorta is mildly dilated (2.3 cm).  8. The inferior vena cava is normal in size with greater than 50%  respiratory variability, suggesting right atrial pressure of 3 mmHg.   Patient Profile     65 y.o. female with persistent atrial fibrillation s/p PVI 11/25/2020, hypertension, hyperlipidemia, diabetes admitted with retroperitoneal bleed.  Assessment & Plan    1.  Retroperitoneal bleed s/p Afib ablation:  Admitted with a large, acute hematoma in the right hemipelvis.  Measuring 11 x 8 x 9 cm.  She has been seen by vascular surgery who recommends conservative management.  She was given 1 unit of packed red blood cells.  H/H has increased appropriately.  Hgb stable today 9.9. Holding Eliquis for one week.    2. Paroxysmal/ Persistent atrial fibrillation:  Underwent PVI 11/25/2020. Previously on flecainide but this was discontinued due to reduced EF.  Now with  recurrent atrial fibrillation and has been feeling weak and tired.  She was in AF with rates 100-120.    Eliquis on hold as above.  Carvedilol is on hold due to low BP. Now on IV amiodarone. Will continue IV amiodarone and monitor.   3.  Acute on chronic renal failure:  Renal function improving with IVF and pRBC.   4. Hyperlipidemia: Continue atorvastatin.  5. Essential hypertension: Home carvedilol is on hold.  6. Diabetes mellitus: Insulin sliding scale.  Home glipizide is on hold.       For questions or updates, please contact [place] Please consult [email or web address] for contact info under        Signed, Luci Bellucci [place], MD  12/07/2020, 9:08 AM

## 2020-12-07 NOTE — Plan of Care (Signed)
  Problem: Education: Goal: Knowledge of General Education information will improve Description: Including pain rating scale, medication(s)/side effects and non-pharmacologic comfort measures Outcome: Progressing   Problem: Activity: Goal: Risk for activity intolerance will decrease Outcome: Progressing   Problem: Coping: Goal: Level of anxiety will decrease Outcome: Progressing   Problem: Nutrition: Goal: Adequate nutrition will be maintained Outcome: Adequate for Discharge   Problem: Elimination: Goal: Will not experience complications related to urinary retention Outcome: Not Applicable

## 2020-12-07 NOTE — Progress Notes (Signed)
Inpatient Diabetes Program Recommendations  AACE/ADA: New Consensus Statement on Inpatient Glycemic Control (2015)  Target Ranges:  Prepandial:   less than 140 mg/dL      Peak postprandial:   less than 180 mg/dL (1-2 hours)      Critically ill patients:  140 - 180 mg/dL   Lab Results  Component Value Date   GLUCAP 245 (H) 12/07/2020   HGBA1C 8.2 (H) 12/05/2020    Review of Glycemic Control Results for [name], [name] (MRN [id number]) as of 12/07/2020 14:52  Ref. Range 12/06/2020 11:48 12/06/2020 16:14 12/06/2020 20:59 12/07/2020 06:20 12/07/2020 11:15  Glucose-Capillary Latest Ref Range: 70 - 99 mg/dL 211 (H) 191 (H) 330 (H) 262 (H) 245 (H)   Diabetes history: DM 2 Outpatient Diabetes medications: glipzide 10 mg bid Current orders for Inpatient glycemic control:  Novolog moderate tid with meals and HS Inpatient Diabetes Program Recommendations:   Please consider adding Lantus 15 units daily while in the hospital.   Thanks,  Adah Perl, RN, BC-ADM Inpatient Diabetes Coordinator Pager [phone] (8a-5p)

## 2020-12-07 NOTE — Plan of Care (Signed)
  Problem: Education: Goal: Knowledge of General Education information will improve Description Including pain rating scale, medication(s)/side effects and non-pharmacologic comfort measures Outcome: Progressing   Problem: Clinical Measurements: Goal: Ability to maintain clinical measurements within normal limits will improve Outcome: Progressing Goal: Diagnostic test results will improve Outcome: Progressing   Problem: Pain Managment: Goal: General experience of comfort will improve Outcome: Progressing   

## 2020-12-07 NOTE — Progress Notes (Addendum)
   VASCULAR SURGERY ASSESSMENT & PLAN:   Right retroperitoneal hematoma likely this is a complication related to the recent ablation procedure. Hgb at 0200 9.8 after one unit PCs. BP soft this am >>recheck CBC.  Addendum at 0921: recheck CBC>>Hgb stable at 9.9  SUBJECTIVE:   She denies N, V, CP, SOB. Has not been OOB this morning. Tolerated diet yesterday. Night and day shift RNs at bedside. Noted drift in BP over the course of early morning.  PHYSICAL EXAM:   Vitals:   12/06/20 1926 12/06/20 1927 12/06/20 2308 12/07/20 0339  BP: 101/73 101/73 (!) 155/64 (!) 110/93  Pulse: 100 100 (!) 113 (!) 102  Resp: 20 15 20 18   Temp:  98.7 F (37.1 C) 98.8 F (37.1 C) 98.9 F (37.2 C)  TempSrc:  Oral Oral Oral  SpO2: 95% 99% 99% 96%  Weight:      Height:       General appearance: Awake, alert in no apparent distress Cardiac: Heart rate and rhythm are regular Respirations: Nonlabored Abdomen: soft, hypereactive BS. ND, NT. Soft, palpable RLQ mass Extremities: Both feet are warm with intact sensation and motor function.  Pulse/Doppler exam: Palpable DP pulses bilaterally.   LABS:   Lab Results  Component Value Date   WBC 10.4 12/07/2020   HGB 9.8 (L) 12/07/2020   HCT 30.0 (L) 12/07/2020   MCV 95.2 12/07/2020   PLT  12/07/2020    PLATELET CLUMPS NOTED ON SMEAR, COUNT APPEARS DECREASED   Lab Results  Component Value Date   CREATININE 1.62 (H) 12/07/2020   Lab Results  Component Value Date   INR 1.5 (H) 10/01/2018   CBG (last 3)  Recent Labs    12/06/20 1614 12/06/20 2059 12/07/20 0620  GLUCAP 191* 330* 262*    PROBLEM LIST:    Active Problems:   Atrial fibrillation with RVR (HCC)   Retroperitoneal bleed   Hypomagnesemia   CURRENT MEDS:   . sodium chloride   Intravenous Once  . atorvastatin  10 mg Oral QHS  . insulin aspart  0-15 Units Subcutaneous TID WC  . insulin aspart  0-5 Units Subcutaneous QHS  . loratadine  10 mg Oral Daily  . montelukast  10 mg  Oral QHS  . pantoprazole  40 mg Oral Daily  . sertraline  50 mg Oral Daily  . sodium chloride flush  3 mL Intravenous Q12H    Barbie Banner, [place] Office: [phone] 12/07/2020   Hemoglobin has been stable over the last 48 hours.  Abdominal pain is less. Will check once daily CBC while in hospital. Currently getting tune up of her afib by cardiology  Ruta Hinds, MD [facility] Office: [phone]

## 2020-12-08 ENCOUNTER — Encounter: Payer: Self-pay | Admitting: Family

## 2020-12-08 ENCOUNTER — Other Ambulatory Visit (HOSPITAL_COMMUNITY): Payer: Self-pay

## 2020-12-08 DIAGNOSIS — N186 End stage renal disease: Secondary | ICD-10-CM

## 2020-12-08 DIAGNOSIS — D631 Anemia in chronic kidney disease: Secondary | ICD-10-CM

## 2020-12-08 DIAGNOSIS — I10 Essential (primary) hypertension: Secondary | ICD-10-CM

## 2020-12-08 LAB — CBC WITH DIFFERENTIAL/PLATELET
Abs Immature Granulocytes: 0.07 10*3/uL (ref 0.00–0.07)
Basophils Absolute: 0.1 10*3/uL (ref 0.0–0.1)
Basophils Relative: 0 %
Eosinophils Absolute: 0.1 10*3/uL (ref 0.0–0.5)
Eosinophils Relative: 1 %
HCT: 28.9 % — ABNORMAL LOW (ref 36.0–46.0)
Hemoglobin: 9.3 g/dL — ABNORMAL LOW (ref 12.0–15.0)
Immature Granulocytes: 1 %
Lymphocytes Relative: 14 %
Lymphs Abs: 1.6 10*3/uL (ref 0.7–4.0)
MCH: 30.9 pg (ref 26.0–34.0)
MCHC: 32.2 g/dL (ref 30.0–36.0)
MCV: 96 fL (ref 80.0–100.0)
Monocytes Absolute: 0.7 10*3/uL (ref 0.1–1.0)
Monocytes Relative: 6 %
Neutro Abs: 9 10*3/uL — ABNORMAL HIGH (ref 1.7–7.7)
Neutrophils Relative %: 78 %
Platelets: 303 10*3/uL (ref 150–400)
RBC: 3.01 MIL/uL — ABNORMAL LOW (ref 3.87–5.11)
RDW: 17.2 % — ABNORMAL HIGH (ref 11.5–15.5)
WBC: 11.5 10*3/uL — ABNORMAL HIGH (ref 4.0–10.5)
nRBC: 0 % (ref 0.0–0.2)

## 2020-12-08 LAB — GLUCOSE, CAPILLARY
Glucose-Capillary: 193 mg/dL — ABNORMAL HIGH (ref 70–99)
Glucose-Capillary: 258 mg/dL — ABNORMAL HIGH (ref 70–99)
Glucose-Capillary: 279 mg/dL — ABNORMAL HIGH (ref 70–99)

## 2020-12-08 MED ORDER — AMIODARONE HCL 200 MG PO TABS
ORAL_TABLET | ORAL | 3 refills | Status: DC
  Filled 2020-12-08: qty 50, 30d supply, fill #0

## 2020-12-08 MED ORDER — ELIQUIS 5 MG PO TABS: 5.0000 mg | ORAL_TABLET | Freq: Two times a day (BID) | ORAL | Status: DC

## 2020-12-08 NOTE — Progress Notes (Addendum)
    Hemoglobin stable.  Abdominal pain less.   She is going home today.  No follow up necessary with me.  Hold anticoagulation 1 week.  Ruta Hinds, MD [facility] [place] Office: [phone]   VASCULAR SURGERY ASSESSMENT & PLAN:   Right retroperitoneal hematoma likely this is a complication related to the recent ablation procedure. S/p one unit PRBCs. VSS. Hgb stable at 9.3 this morning.  Tolerating diet.  Abdominal pain resolved.   SUBJECTIVE:   Was out of bed yesterday without dizziness or near syncope.  She states she had fleeting right groin pain overnight, now resolved.  PHYSICAL EXAM:   Vitals:   12/07/20 1957 12/08/20 0112 12/08/20 0421 12/08/20 0433  BP: (!) 110/44 134/81 123/79   Pulse: 91 95 86   Resp: 15 20 19    Temp: 98.2 F (36.8 C) 98.1 F (36.7 C) 98.4 F (36.9 C)   TempSrc: Oral Oral Oral   SpO2: 100% 100% 99%   Weight:    71.3 kg  Height:       General appearance: Awake, alert in no apparent distress Cardiac: Heart rate and rhythm are regular Respirations: Nonlabored Abdomen: soft, hyperactive BS. ND, NT. Soft, palpable RLQ mass Extremities: Both feet are warm with intact sensation and motor function.  2+ bilateral femoral pulses. Pulse/Doppler exam: Palpable DP pulses bilaterally.  LABS:   Lab Results  Component Value Date   WBC 11.5 (H) 12/08/2020   HGB 9.3 (L) 12/08/2020   HCT 28.9 (L) 12/08/2020   MCV 96.0 12/08/2020   PLT 303 12/08/2020   Lab Results  Component Value Date   CREATININE 1.62 (H) 12/07/2020   Lab Results  Component Value Date   INR 1.5 (H) 10/01/2018   CBG (last 3)  Recent Labs    12/07/20 1546 12/07/20 2207 12/08/20 0544  GLUCAP 263* 198* 193*    PROBLEM LIST:    Active Problems:   Atrial fibrillation with RVR (HCC)   Retroperitoneal bleed   Hypomagnesemia   CURRENT MEDS:   . sodium chloride   Intravenous Once  . amiodarone  400 mg Oral BID  . atorvastatin  10 mg Oral QHS   . insulin aspart  0-15 Units Subcutaneous TID WC  . insulin aspart  0-5 Units Subcutaneous QHS  . loratadine  10 mg Oral Daily  . montelukast  10 mg Oral QHS  . pantoprazole  40 mg Oral Daily  . sertraline  50 mg Oral Daily  . sodium chloride flush  3 mL Intravenous Q12H    Deitra Mayo Office: [phone] 12/08/2020

## 2020-12-08 NOTE — Care Management Important Message (Signed)
Important Message  Patient Details  Name: [name] MRN: [id number] Date of Birth: [birth year]   Medicare Important Message Given:  Yes     Orbie Pyo 12/08/2020, 2:27 PM

## 2020-12-08 NOTE — Discharge Summary (Signed)
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 Left ventricular ejection fraction, by estimation, is 40 to 45%. The  left ventricle has mildly decreased function. The left ventricle  demonstrates global hypokinesis. There is moderate concentric left  ventricular hypertrophy. Left ventricular  diastolic parameters are indeterminate. The average left ventricular  global longitudinal strain is -9.0 %. The global longitudinal strain is  abnormal.  2. Right ventricular systolic function is normal. The right ventricular  size is normal. There is moderately elevated pulmonary artery systolic  pressure.  3. Left atrial size was mildly dilated.  4. The mitral valve is calcified. There is moderate posterior mitral  annular calcification. Moderate to severe mitral valve regurgitation. MR  vol 63 ml, ERO 0.42 cmsq. No evidence of mitral stenosis.  5. Tricuspid valve regurgitation is moderate to severe.  6. The aortic valve is normal in structure. Aortic valve regurgitation is  not visualized. No aortic stenosis is present.  7. Abdominal aorta is mildly dilated (2.3 cm).  8. The inferior vena cava is normal in size with greater than 50%  respiratory variability, suggesting right atrial pressure of 3 mmHg.  _____________   History of Present Illness     [name] is a 65 y.o. female with history of AF s/p PVI on 5/25 (on apixaban), HTN, HLD, DM2 who  presented 12/05/20 with abdominal pain and generally feeling unwell.  Ms. Farnworth underwent ablation for AF on 5/25. The peri-procedural period was uncomplicated. She developed abdominal pain a few days ago. She was seen by her PCP for this and was prescribed a course of antibiotics for presumed UTI. She had been taking this as prescribed, however her symptoms persisted. She then developed recurrent atrial fibrillation, which she states made her feel very unwell.  All day today she has felt weak and tired, which she attributed to the atrial fibrillation.  Given the symptoms, she came to the emergency department for work-up.  On arrival to the ED, the patient was found to be in rapid atrial fibrillation with heart rates reaching the 120s.  Her blood pressure was on the lower side as well with systolics ranging from 78-2 10.  The rest of her vital signs were unremarkable.  Her laboratory studies revealed a hematocrit of 25.9, which was decreased from 38.7 drawn about 1 month prior  Notably she had been taking her apixaban as prescribed since her ablation.  A CT scan was performed which revealed a large acute appearing hematoma within the right hemipelvis extending from the right pelvic sidewall to the midline pelvis measuring approximately 11 x 8 x 9 cm.  The patient was given 1 g of magnesium for a mag of 1.1 and her atrial fibrillation converted to normal sinus rhythm.  Vascular surgery was consulted and and recommended conservative management at this time.  Currently the patient states that she feels reasonably well now that she is out of atrial fibrillation.  She has persistent  mild lower abdominal discomfort.  She otherwise has no complaints.  Hospital Course     Consultants: [facility]  Retroperitoneal bleed, hematoma Vascular surgery was consulted and treated conservatively. She was given 1U PRBC and Hb has remained stable. Discharge Hb was 9.3. Recheck CBC at outpatient follow up.    Atrial  fibrillation s/p recent Afib ablation She converted back to Afib in the setting of RP bleed. She was loaded with IV amiodarone and subsequently converted to sinus rhythm. Will discharge on PO amiodarone load. Do not suspect she will need this long term.     Need for chronic anticoagulation Per VVS, will hold anticoagulation for 1 week and resume eliquis on June 14.    CKD stage III sCr at discharge 1.62, K 4.5.   Hypertension Intermittently with marginal pressures this admission and coreg initially held. BP improved, will restart home medications. She has ambulated without dizziness or near syncope.    Pt seen and examined in conjunction with Dr. [place] and deemed stable for discharge. Follow up has been arranged.   Did the patient have an acute coronary syndrome (MI, NSTEMI, STEMI, etc) this admission?:  No                               Did the patient have a percutaneous coronary intervention (stent / angioplasty)?:  No.       _____________  Discharge Vitals Blood pressure 131/67, pulse 86, temperature 98.4 F (36.9 C), temperature source Oral, resp. rate 19, height 4' 11"  (1.499 m), weight 71.3 kg, SpO2 100 %.  Filed Weights   12/05/20 2257 12/08/20 0433  Weight: 72.5 kg 71.3 kg    Labs & Radiologic Studies    CBC Recent Labs    12/07/20 0159 12/07/20 0801 12/08/20 0050  WBC 10.4 11.1* 11.5*  NEUTROABS 7.5  --  9.0*  HGB 9.8* 9.9* 9.3*  HCT 30.0* 30.2* 28.9*  MCV 95.2 95.3 96.0  PLT PLATELET CLUMPS NOTED ON SMEAR, COUNT APPEARS DECREASED 326 143   Basic Metabolic Panel Recent Labs    12/06/20 0450 12/06/20 1300 12/07/20 0159  NA 137  --  137  K 4.5  --  4.5  CL 107  --  106  CO2 22  --  19*  GLUCOSE 141*  --  226*  BUN 21  --  28*  CREATININE 1.22*  --  1.62*  CALCIUM 9.0  --  9.0  MG  --  1.5* 1.9   Liver Function Tests No results for input(s): AST, ALT, ALKPHOS, BILITOT, PROT, ALBUMIN in the last 72 hours. No results for input(s): LIPASE, AMYLASE  in the last 72 hours. High Sensitivity Troponin:   No results for input(s): TROPONINIHS in the last 720 hours.  BNP Invalid input(s): POCBNP D-Dimer No results for input(s): DDIMER in the last 72 hours. Hemoglobin A1C Recent Labs    12/05/20 1940  HGBA1C 8.2*   Fasting Lipid Panel No results for input(s): CHOL, HDL, LDLCALC, TRIG, CHOLHDL, LDLDIRECT in the last 72 hours. Thyroid Function Tests No results for input(s): TSH, T4TOTAL, T3FREE, THYROIDAB in the last 72 hours.  Invalid input(s): FREET3 _____________  CT Abdomen Pelvis Wo Contrast  Result Date: 12/05/2020 CLINICAL DATA:  Anemia, recent cardiac ablation. Evaluate for retroperitoneal bleed. EXAM: CT ABDOMEN AND PELVIS WITHOUT CONTRAST TECHNIQUE: Multidetector CT imaging of the abdomen and pelvis was performed following the standard protocol without IV contrast. COMPARISON:  CT abdomen  dated 09/26/2018 FINDINGS: Lower chest: No acute abnormality. Hepatobiliary: No focal liver abnormality is seen. No gallstones, gallbladder wall thickening, or biliary dilatation. Pancreas: Unremarkable. No pancreatic ductal dilatation or surrounding inflammatory changes. Spleen: Normal in size without focal abnormality. Adrenals/Urinary Tract: Adrenal glands appear normal. Kidneys are unremarkable without hydronephrosis or perinephric fluid/hemorrhage. Punctate nonobstructing RIGHT renal stone. No ureteral or bladder calculi. Bladder is partially decompressed, displaced to the LEFT pelvis by acute hemorrhage in the RIGHT hemipelvis. Stomach/Bowel: No dilated large or small bowel loops. No evidence of bowel wall inflammation. Appendix is normal. Stomach is unremarkable, partially decompressed. Vascular/Lymphatic: Aortic atherosclerosis. No enlarged lymph nodes are seen. Reproductive: Partially calcified fibroids within the otherwise normal appearing uterus. LEFT adnexal regions unremarkable. Other: Large acute hematoma within the RIGHT hemipelvis, extending  from the RIGHT pelvic sidewall to the midline pelvis, measuring 10.5 x 8 x 9.1 cm, displacing the bladder to the LEFT hemipelvis. Additional fluid stranding/hemorrhage within the more anterior pelvis. Musculoskeletal: No acute-appearing osseous abnormality. Surgical changes of central canal decompression of the lower lumbar spine with associated fixation hardware in place throughout the lumbar spine. Superficial/subcutaneous soft tissues are unremarkable. IMPRESSION: 1. Large acute-appearing hematoma within the RIGHT hemipelvis, extending from the RIGHT pelvic sidewall to the midline pelvis, measuring 10.5 x 8 x 9.1 cm, displacing the bladder to the LEFT hemipelvis. Additional fluid stranding/hemorrhage within the more anterior pelvis. Consider CT angiogram to exclude active hemorrhage. 2. Punctate nonobstructing RIGHT renal stone. No evidence of ureteral or bladder calculi. No hydronephrosis. 3. Additional chronic/incidental findings detailed above. Aortic Atherosclerosis (ICD10-I70.0). Critical Value/emergent results were called by telephone at the time of interpretation on 12/05/2020 at 6:15 pm to provider [facility] , who verbally acknowledged these results. Electronically Signed   By: Franki Cabot M.D.   On: 12/05/2020 18:17   EP STUDY  Result Date: 11/25/2020 SURGEON:  Allegra Lai, MD PREPROCEDURE DIAGNOSES: 1. Paroxysmal atrial fibrillation. POSTPROCEDURE DIAGNOSES: 1. Paroxysmal  atrial fibrillation. PROCEDURES: 1. Comprehensive electrophysiologic study. 2. Coronary sinus pacing and recording. 3. Three-dimensional mapping of atrial fibrillation with additional mapping and ablation of a second discrete focus 4. Ablation of atrial fibrillation with additional mapping and ablation of a second discrete focus 5. Intracardiac echocardiography. 6. Transseptal puncture of an intact septum. 7. Arrhythmia induction with pacing with dobutamine infusion INTRODUCTION:  ARRINGTON BENCOMO is a 65 y.o. female with a  history of paroxysmal atrial fibrillation who now presents for EP study and radiofrequency ablation.  The patient reports initially being diagnosed with atrial fibrillation after presenting with symptomatic palpitations and fatgiue. The patient reports increasing frequency and duration of atrial fibrillation since that time.  The patient has failed medical therapy.  The patient therefore presents today for catheter ablation of atrial fibrillation. DESCRIPTION OF PROCEDURE:  Informed written consent was obtained, and the patient was brought to the electrophysiology lab in a fasting state.  The patient was adequately sedated with intravenous medications as outlined in the anesthesia report.  The patient's left and right groins were prepped and draped in the usual sterile fashion by the EP lab staff.  Using a percutaneous Seldinger technique, two 8-French hemostasis sheaths were placed in the right femoral vein, and one 7 [place] and one 11-French hemostasis sheaths were placed into the left common femoral vein. An esophageal temperature probe was inserted to monitor for heating of the esophagus during the procedure. Direct ultrasound guidance is used for right and left femoral veins with normal vessel patency. Ultrasound images are captured and  stored in the patient's chart. Using ultrasound guidance, the Brockenbrough needle and wire were visualized entering the vessel. Catheter Placement:  A 7-French Biosense Webster Decapolar coronary sinus catheter was introduced through the right common femoral vein and advanced into the coronary sinus for recording and pacing from this location.  Initial Measurements: The patient presented to the electrophysiology lab in sinus rhythm. her  PR interval measured 184 msec with a QRS duration of 113 msec and a QT interval of 468 msec.   Intracardiac Echocardiography: A 10-French Biosense Webster AcuNav intracardiac echocardiography catheter was introduced through the right common  femoral vein and advanced into the right atrium. Intracardiac echocardiography was performed of the left atrium, and a three-dimensional anatomical rendering of the left atrium was performed using CARTO sound technology.  The patient was noted to have a moderate sized left atrium.  The interatrial septum was prominent but not aneurysmal. All 4 pulmonary veins were visualized and noted to have separate ostia.  The pulmonary veins were moderate in size.  The left atrial appendage was visualized and did not reveal thrombus.   There was no evidence of pulmonary vein stenosis. Transseptal Puncture: The right common femoral vein sheaths were exchanged for two 8.5 French Agillis transseptal sheath and transseptal access was achieved in a standard fashion using a Brockenbrough needle under fluoroscopy with intracardiac echocardiography confirmation of the transseptal puncture.  Once transseptal access had been achieved, heparin was administered intravenously and intra- arterially in order to maintain an ACT of greater than 350 seconds throughout the procedure. 3D Mapping and Ablation: The His bundle catheter was removed and in its place a 3.5 mm [employer] Thermocool ablation catheter was advanced into the right atrium.  The transseptal sheath was pulled back into the IVC over a guidewire.  The ablation catheter was advanced across the transseptal hole using the wire as a guide.  The transseptal sheath was then re-advanced over the guidewire into the left atrium.  A duodecapolar Biosense Webster pentarray mapping catheter was introduced through the transseptal sheath and positioned over the mouth of all 4 pulmonary veins.  Three-dimensional electroanatomical mapping was performed using CARTO technology.  This demonstrated electrical activity within all four pulmonary veins at baseline. The patient underwent successful sequential electrical isolation and anatomical encircling of all four pulmonary veins  using radiofrequency current with a circular mapping catheter as a guide.  A WACA approach was used.  Entrance and exit block were confirmed. Cardioversion: The patient was then cardioverted to sinus rhythm with a single synchronized 360-J biphasic shock with cardioversion electrodes in the anterior-posterior thoracic configuration. He maintained sinus rhythm initially but then had recurence of afib with catheter manipulation within the left atrial, requiring repeat cardioversion with 360J biphasic.  He remained in sinus rhythm thereafter. Measurements Following Ablation: Following ablation, dobutamine was infused up to 20 mcg/kg/min with no inducible atrial fibrillation, atrial tachycardia, atrial flutter, or sustained PACs. In sinus rhythm with RR interval was 750 msec, with PR 188 msec, QRS 94 msec, and QT 463 msec.  Following ablation the AH interval measured 88 msec with an HV interval of 40 msec. Ventricular pacing was performed, which revealed VA dissociation at 600 msec. Rapid atrial pacing was performed, which revealed an AV Wenckebach cycle length of 450 msec.  Electroisolation was then again confirmed in all four pulmonary veins.  Pacing was performed along the ablation line which confirmed entrance and exit block. The procedure was therefore considered completed.  All catheters were removed,  and the sheaths were aspirated and flushed.  The patient was transferred to the recovery area for sheath removal per protocol. EBL<46m.  Intracardiac echocardiogram revealed no pericardial effusion.  There were no early apparent complications. CONCLUSIONS: 1. Sinus rhythm upon presentation.  2. Successful electrical isolation and anatomical encircling of all four pulmonary veins with radiofrequency current. 3. No inducible arrhythmias following ablation both on and off of dobutamine 4. No early apparent complications. WOcie DoyneCamnitz,MD 10:33 AM 11/25/2020   DG Chest Port 1 View  Result Date:  12/05/2020 CLINICAL DATA:  Shortness of breath, recent cardiac ablation. EXAM: PORTABLE CHEST 1 VIEW COMPARISON:  Chest radiograph dated 09/01/2020. FINDINGS: The heart is enlarged. Vascular calcifications are seen in the aortic arch. There is mild left basilar atelectasis. The right lung is clear. There is no pleural effusion or pneumothorax. Spinal fixation hardware is redemonstrated. IMPRESSION: Mild left basilar atelectasis. Aortic Atherosclerosis (ICD10-I70.0). Electronically Signed   By: TZerita BoersM.D.   On: 12/05/2020 16:35   MM DIAG BREAST TOMO UNI LEFT  Result Date: 11/21/2020 CLINICAL DATA:  Fullness in the LEFT axilla. History of LEFT lumpectomy with radiation therapy and chemotherapy in 2002. Patient had a second LEFT lumpectomy for fat necrosis in 2015. EXAM: DIGITAL DIAGNOSTIC UNILATERAL LEFT MAMMOGRAM WITH TOMOSYNTHESIS AND CAD; [place] LEFT TECHNIQUE: Left digital diagnostic mammography and breast tomosynthesis was performed. The images were evaluated with computer-aided detection.; Targeted ultrasound examination of the left axilla was performed. COMPARISON:  Previous exam(s). ACR Breast Density Category a: The breast tissue is almost entirely fatty. FINDINGS: Postoperative changes are identified in the inferior and superior portions of the LEFT breast. BB marks area of patient's concern which corresponds to an area fibrofatty tissue and fatty replaced lymph node. On physical exam, I palpate soft fullness above the LEFT axillary scar. I palpate no discrete mass in this region. Targeted ultrasound is performed, showing normal appearing fibrofatty tissue in the LEFT axilla. Lymph nodes with normal morphology are present. No suspicious mass or adenopathy. IMPRESSION: No mammographic or ultrasound evidence for malignancy. Fibrofatty tissue in the LEFT axilla corresponding to the area of patient's concern. RECOMMENDATION: Recommend screening mammogram in October 2022. I have discussed the  findings and recommendations with the patient. If applicable, a reminder letter will be sent to the patient regarding the next appointment. BI-RADS CATEGORY  2: Benign. Electronically Signed   By: ENolon NationsM.D.   On: 11/21/2020 09:36   [place] LEFT  Result Date: 11/21/2020 CLINICAL DATA:  Fullness in the LEFT axilla. History of LEFT lumpectomy with radiation therapy and chemotherapy in 2002. Patient had a second LEFT lumpectomy for fat necrosis in 2015. EXAM: DIGITAL DIAGNOSTIC UNILATERAL LEFT MAMMOGRAM WITH TOMOSYNTHESIS AND CAD; [place] LEFT TECHNIQUE: Left digital diagnostic mammography and breast tomosynthesis was performed. The images were evaluated with computer-aided detection.; Targeted ultrasound examination of the left axilla was performed. COMPARISON:  Previous exam(s). ACR Breast Density Category a: The breast tissue is almost entirely fatty. FINDINGS: Postoperative changes are identified in the inferior and superior portions of the LEFT breast. BB marks area of patient's concern which corresponds to an area fibrofatty tissue and fatty replaced lymph node. On physical exam, I palpate soft fullness above the LEFT axillary scar. I palpate no discrete mass in this region. Targeted ultrasound is performed, showing normal appearing fibrofatty tissue in the LEFT axilla. Lymph nodes with normal morphology are present. No suspicious mass or adenopathy. IMPRESSION: No mammographic or ultrasound evidence for malignancy. Fibrofatty  tissue in the LEFT axilla corresponding to the area of patient's concern. RECOMMENDATION: Recommend screening mammogram in October 2022. I have discussed the findings and recommendations with the patient. If applicable, a reminder letter will be sent to the patient regarding the next appointment. BI-RADS CATEGORY  2: Benign. Electronically Signed   By: Nolon Nations M.D.   On: 11/21/2020 09:36   ECHO TEE  Result Date: 11/30/2020    TRANSESOPHOGEAL ECHO REPORT    Patient Name:   BENNETTA RUDDEN Date of Exam: 11/24/2020 Medical Rec #:  [id number]       Height:       59.0 in Accession #:    [id number]      Weight:       155.0 lb Date of Birth:  1955/12/16      BSA:          1.655 m Patient Age:    3 years        BP:           150/90 mmHg Patient Gender: F               HR:           98 bpm. Exam Location:  Inpatient Procedure: 3D Echo, Transesophageal Echo, Cardiac Doppler and Color Doppler Indications:     Atrial Fibrillation  History:         Patient has prior history of Echocardiogram examinations, most                  recent 10/06/2020.  Sonographer:     Philipp Deputy Referring Phys:  [id number] [place] Diagnosing Phys: Eleonore Chiquito MD PROCEDURE: After discussion of the risks and benefits of a TEE, an informed consent was obtained from the patient. TEE procedure time was 30 minutes. The transesophogeal probe was passed without difficulty through the esophogus of the patient. Imaged were obtained with the patient in a left lateral decubitus position. Local oropharyngeal anesthetic was provided with Cetacaine. Sedation performed by different physician. The patient was monitored while under deep sedation. Anesthestetic sedation was provided intravenously by Anesthesiology: 155.64m of Propofol. Image quality was excellent. The patient's vital signs; including heart rate, blood pressure, and oxygen saturation; remained stable throughout the procedure. The patient developed no complications during the procedure. IMPRESSIONS  1. No left atrial/left atrial appendage thrombus was detected. The LAA emptying velocity was 40 cm/s.  2. Left ventricular ejection fraction, by estimation, is 55 to 60%. The left ventricle has normal function.  3. Right ventricular systolic function is normal. The right ventricular size is normal.  4. The mitral valve is grossly normal. Mild mitral valve regurgitation. No evidence of mitral stenosis.  5. The aortic valve is tricuspid. Aortic  valve regurgitation is not visualized. No aortic stenosis is present.  6. There is mild (Grade II) layered plaque involving the ascending aorta and transverse aorta.  7. Agitated saline contrast bubble study was negative, with no evidence of any interatrial shunt. FINDINGS  Left Ventricle: Left ventricular ejection fraction, by estimation, is 55 to 60%. The left ventricle has normal function. The left ventricular internal cavity size was normal in size. Right Ventricle: The right ventricular size is normal. No increase in right ventricular wall thickness. Right ventricular systolic function is normal. Left Atrium: Left atrial size was normal in size. No left atrial/left atrial appendage thrombus was detected. The LAA emptying velocity was 40 cm/s. Right Atrium: Right atrial size was normal in size. Pericardium:  There is no evidence of pericardial effusion. Mitral Valve: The mitral valve is grossly normal. Mild mitral valve regurgitation. No evidence of mitral valve stenosis. Tricuspid Valve: The tricuspid valve is grossly normal. Tricuspid valve regurgitation is trivial. No evidence of tricuspid stenosis. Aortic Valve: The aortic valve is tricuspid. Aortic valve regurgitation is not visualized. No aortic stenosis is present. Pulmonic Valve: The pulmonic valve was grossly normal. Pulmonic valve regurgitation is not visualized. No evidence of pulmonic stenosis. Aorta: The aortic root is normal in size and structure. There is mild (Grade II) layered plaque involving the ascending aorta and transverse aorta. Venous: The left upper pulmonary vein and right upper pulmonary vein are normal. IAS/Shunts: The atrial septum is grossly normal. Agitated saline contrast was given intravenously to evaluate for intracardiac shunting. Agitated saline contrast bubble study was negative, with no evidence of any interatrial shunt.   AORTA Ao Root diam: 2.80 cm Ao Asc diam:  2.70 cm TRICUSPID VALVE TR Peak grad:   29.2 mmHg TR Vmax:         270.00 cm/s Eleonore Chiquito MD Electronically signed by Eleonore Chiquito MD Signature Date/Time: 11/30/2020/7:49:35 PM    Final    Disposition   Pt is being discharged home today in good condition.  Follow-up Plans & Appointments     Follow-up Information    Sherran Needs, NP Follow up on 12/23/2020.   Specialties: Nurse Practitioner, Cardiology Why: 11:30 Contact information: [place] [address] [phone]        Park Liter, MD Follow up on 12/23/2020.   Specialty: Cardiology Why: 3:20 Contact information: [place] [address] [phone]              Discharge Instructions    Diet - low sodium heart healthy   Complete by: As directed    Increase activity slowly   Complete by: As directed       Discharge Medications   Allergies as of 12/08/2020      Reactions   Pneumococcal Vaccines Other (See Comments)   Really high fever, and flu symptoms. MD instructed not to give   Cleocin [clindamycin Hcl] Other (See Comments)   "irritated my esophagus"   Morphine And Related Nausea And Vomiting   Other Nausea And Vomiting, Swelling   Shrimp if eat a lot   Shellfish-derived Products Other (See Comments)   Patient stated she is allergic to "shrimp," if she eats "a lot"   Ativan [lorazepam] Other (See Comments)   Difficulty waking   Buprenorphine Hcl Nausea And Vomiting   Pt does not know if she has ever had this medication      Medication List    TAKE these medications   acetaminophen 500 MG tablet Commonly known as: TYLENOL Take 500 mg by mouth every 6 (six) hours as needed for mild pain, headache or fever.   albuterol 108 (90 Base) MCG/ACT inhaler Commonly known as: VENTOLIN HFA Inhale 2 puffs into the lungs every 6 (six) hours as needed for wheezing or shortness of breath.   amiodarone 200 MG tablet Commonly known as: PACERONE Take 400 mg (2 tablets) twice daily for 7 days, then reduce to 200 mg (1 tablet) daily  thereafter.   atorvastatin 10 MG tablet Commonly known as: LIPITOR Take 10 mg by mouth at bedtime.   carvedilol 6.25 MG tablet Commonly known as: COREG Take 6.25 mg by mouth 2 (two) times daily with a meal. Patient will take 1 tablet (  6.25 mg) by mouth 3 times daily if in afib   cholecalciferol 25 MCG (1000 UNIT) tablet Commonly known as: VITAMIN D Take 1,000 Units by mouth in the morning.   ciprofloxacin 500 MG tablet Commonly known as: CIPRO SMARTSIG:1 Tablet(s) By Mouth Every 12 Hours   COLLAGEN PO Take 1 capsule by mouth daily.   Eliquis 5 MG Tabs tablet Generic drug: apixaban Take 1 tablet (5 mg total) by mouth 2 (two) times daily. Resume on 12/15/20. What changed: additional instructions   fluticasone 50 MCG/ACT nasal spray Commonly known as: FLONASE Place 1 spray into both nostrils daily as needed for allergies or rhinitis.   furosemide 20 MG tablet Commonly known as: LASIX Take 20 mg by mouth daily as needed for edema (weight gain of 2 lbs or more x 2 days). What changed: Another medication with the same name was removed. Continue taking this medication, and follow the directions you see here.   glipiZIDE 10 MG 24 hr tablet Commonly known as: GLUCOTROL XL Take 10 mg by mouth in the morning and at bedtime.   Glucagon Emergency 1 MG Kit Inject 1 mg into the muscle as needed (When glucose drop low levels).   loratadine 10 MG tablet Commonly known as: CLARITIN Take 10 mg by mouth in the morning.   montelukast 10 MG tablet Commonly known as: SINGULAIR Take 10 mg by mouth at bedtime.   omeprazole 20 MG capsule Commonly known as: PRILOSEC Take 20 mg by mouth in the morning.   OVER THE COUNTER MEDICATION Take 2 capsules by mouth 2 (two) times daily. Glucosil for blood sugar   sertraline 50 MG tablet Commonly known as: ZOLOFT Take 50 mg by mouth in the morning.   sodium bicarbonate 650 MG tablet Take 650 mg by mouth 2 (two) times daily.   solifenacin 5 MG  tablet Commonly known as: VESICARE Take 5 mg by mouth daily.   vitamin C 1000 MG tablet Take 1,000 mg by mouth in the morning.   VITAMIN E PO Take 1 capsule by mouth daily.   Zinc 50 MG Tabs Take 50 mg by mouth in the morning.          Outstanding Labs/Studies   CBC  Duration of Discharge Encounter   Greater than 30 minutes including physician time.  Signed, [place], PA 12/08/2020, 9:31 AM

## 2020-12-17 ENCOUNTER — Ambulatory Visit: Payer: Medicare Other | Admitting: Cardiology

## 2020-12-21 ENCOUNTER — Inpatient Hospital Stay: Payer: Medicare Other

## 2020-12-21 ENCOUNTER — Inpatient Hospital Stay (HOSPITAL_BASED_OUTPATIENT_CLINIC_OR_DEPARTMENT_OTHER): Payer: Medicare Other | Admitting: Family

## 2020-12-21 ENCOUNTER — Inpatient Hospital Stay: Payer: Medicare Other | Attending: Family

## 2020-12-21 ENCOUNTER — Other Ambulatory Visit (HOSPITAL_COMMUNITY): Payer: Self-pay

## 2020-12-21 ENCOUNTER — Other Ambulatory Visit: Payer: Self-pay

## 2020-12-21 ENCOUNTER — Encounter: Payer: Self-pay | Admitting: Family

## 2020-12-21 VITALS — BP 133/73 | HR 82 | Temp 100.1°F | Resp 18 | Wt 157.8 lb

## 2020-12-21 DIAGNOSIS — N644 Mastodynia: Secondary | ICD-10-CM

## 2020-12-21 DIAGNOSIS — N185 Chronic kidney disease, stage 5: Secondary | ICD-10-CM | POA: Diagnosis not present

## 2020-12-21 DIAGNOSIS — D508 Other iron deficiency anemias: Secondary | ICD-10-CM | POA: Diagnosis not present

## 2020-12-21 DIAGNOSIS — D631 Anemia in chronic kidney disease: Secondary | ICD-10-CM

## 2020-12-21 DIAGNOSIS — D500 Iron deficiency anemia secondary to blood loss (chronic): Secondary | ICD-10-CM

## 2020-12-21 DIAGNOSIS — N183 Chronic kidney disease, stage 3 unspecified: Secondary | ICD-10-CM | POA: Diagnosis not present

## 2020-12-21 DIAGNOSIS — C50912 Malignant neoplasm of unspecified site of left female breast: Secondary | ICD-10-CM | POA: Diagnosis present

## 2020-12-21 DIAGNOSIS — M7989 Other specified soft tissue disorders: Secondary | ICD-10-CM

## 2020-12-21 DIAGNOSIS — C50011 Malignant neoplasm of nipple and areola, right female breast: Secondary | ICD-10-CM

## 2020-12-21 LAB — RETICULOCYTES
Immature Retic Fract: 21.7 % — ABNORMAL HIGH (ref 2.3–15.9)
RBC.: 3.47 MIL/uL — ABNORMAL LOW (ref 3.87–5.11)
Retic Count, Absolute: 117.6 10*3/uL (ref 19.0–186.0)
Retic Ct Pct: 3.4 % — ABNORMAL HIGH (ref 0.4–3.1)

## 2020-12-21 LAB — CBC WITH DIFFERENTIAL (CANCER CENTER ONLY)
Abs Immature Granulocytes: 0.03 10*3/uL (ref 0.00–0.07)
Basophils Absolute: 0.1 10*3/uL (ref 0.0–0.1)
Basophils Relative: 1 %
Eosinophils Absolute: 0.3 10*3/uL (ref 0.0–0.5)
Eosinophils Relative: 3 %
HCT: 32.9 % — ABNORMAL LOW (ref 36.0–46.0)
Hemoglobin: 10.6 g/dL — ABNORMAL LOW (ref 12.0–15.0)
Immature Granulocytes: 0 %
Lymphocytes Relative: 20 %
Lymphs Abs: 1.9 10*3/uL (ref 0.7–4.0)
MCH: 30.5 pg (ref 26.0–34.0)
MCHC: 32.2 g/dL (ref 30.0–36.0)
MCV: 94.8 fL (ref 80.0–100.0)
Monocytes Absolute: 0.7 10*3/uL (ref 0.1–1.0)
Monocytes Relative: 7 %
Neutro Abs: 6.4 10*3/uL (ref 1.7–7.7)
Neutrophils Relative %: 69 %
Platelet Count: 265 10*3/uL (ref 150–400)
RBC: 3.47 MIL/uL — ABNORMAL LOW (ref 3.87–5.11)
RDW: 15.5 % (ref 11.5–15.5)
WBC Count: 9.3 10*3/uL (ref 4.0–10.5)
nRBC: 0 % (ref 0.0–0.2)

## 2020-12-21 LAB — CMP (CANCER CENTER ONLY)
ALT: 17 U/L (ref 0–44)
AST: 22 U/L (ref 15–41)
Albumin: 4.1 g/dL (ref 3.5–5.0)
Alkaline Phosphatase: 106 U/L (ref 38–126)
Anion gap: 9 (ref 5–15)
BUN: 32 mg/dL — ABNORMAL HIGH (ref 8–23)
CO2: 29 mmol/L (ref 22–32)
Calcium: 9.7 mg/dL (ref 8.9–10.3)
Chloride: 99 mmol/L (ref 98–111)
Creatinine: 1.44 mg/dL — ABNORMAL HIGH (ref 0.44–1.00)
GFR, Estimated: 40 mL/min — ABNORMAL LOW (ref >60)
Glucose, Bld: 201 mg/dL — ABNORMAL HIGH (ref 70–99)
Potassium: 3.9 mmol/L (ref 3.5–5.1)
Sodium: 137 mmol/L (ref 135–145)
Total Bilirubin: 0.6 mg/dL (ref 0.3–1.2)
Total Protein: 7.3 g/dL (ref 6.5–8.1)

## 2020-12-21 MED ORDER — DARBEPOETIN ALFA 300 MCG/0.6ML IJ SOSY
PREFILLED_SYRINGE | INTRAMUSCULAR | Status: AC
  Filled 2020-12-21: qty 0.6

## 2020-12-21 MED ORDER — DARBEPOETIN ALFA 300 MCG/0.6ML IJ SOSY
300.0000 ug | PREFILLED_SYRINGE | Freq: Once | INTRAMUSCULAR | Status: AC
  Administered 2020-12-21: 300 ug via SUBCUTANEOUS

## 2020-12-21 NOTE — Patient Instructions (Signed)
Darbepoetin Alfa injection What is this medication? DARBEPOETIN ALFA (dar be POE e tin AL fa) helps your body make more red blood cells. It is used to treat anemia caused by chronic kidney failure and chemotherapy. This medicine may be used for other purposes; ask your health care provider or pharmacist if you have questions. COMMON BRAND NAME(S): Aranesp What should I tell my care team before I take this medication? They need to know if you have any of these conditions: blood clotting disorders or history of blood clots cancer patient not on chemotherapy cystic fibrosis heart disease, such as angina, heart failure, or a history of a heart attack hemoglobin level of 12 g/dL or greater high blood pressure low levels of folate, iron, or vitamin B12 seizures an unusual or allergic reaction to darbepoetin, erythropoietin, albumin, hamster proteins, latex, other medicines, foods, dyes, or preservatives pregnant or trying to get pregnant breast-feeding How should I use this medication? This medicine is for injection into a vein or under the skin. It is usually given by a health care professional in a hospital or clinic setting. If you get this medicine at home, you will be taught how to prepare and give this medicine. Use exactly as directed. Take your medicine at regular intervals. Do not take your medicine more often than directed. It is important that you put your used needles and syringes in a special sharps container. Do not put them in a trash can. If you do not have a sharps container, call your pharmacist or healthcare provider to get one. A special MedGuide will be given to you by the pharmacist with each prescription and refill. Be sure to read this information carefully each time. Talk to your pediatrician regarding the use of this medicine in children. While this medicine may be used in children as young as 1 month of age for selected conditions, precautions do apply. Overdosage: If you  think you have taken too much of this medicine contact a poison control center or emergency room at once. NOTE: This medicine is only for you. Do not share this medicine with others. What if I miss a dose? If you miss a dose, take it as soon as you can. If it is almost time for your next dose, take only that dose. Do not take double or extra doses. What may interact with this medication? Do not take this medicine with any of the following medications: epoetin alfa This list may not describe all possible interactions. Give your health care provider a list of all the medicines, herbs, non-prescription drugs, or dietary supplements you use. Also tell them if you smoke, drink alcohol, or use illegal drugs. Some items may interact with your medicine. What should I watch for while using this medication? Your condition will be monitored carefully while you are receiving this medicine. You may need blood work done while you are taking this medicine. This medicine may cause a decrease in vitamin B6. You should make sure that you get enough vitamin B6 while you are taking this medicine. Discuss the foods you eat and the vitamins you take with your health care professional. What side effects may I notice from receiving this medication? Side effects that you should report to your doctor or health care professional as soon as possible: allergic reactions like skin rash, itching or hives, swelling of the face, lips, or tongue breathing problems changes in vision chest pain confusion, trouble speaking or understanding feeling faint or lightheaded, falls high blood pressure   muscle aches or pains pain, swelling, warmth in the leg rapid weight gain severe headaches sudden numbness or weakness of the face, arm or leg trouble walking, dizziness, loss of balance or coordination seizures (convulsions) swelling of the ankles, feet, hands unusually weak or tired Side effects that usually do not require medical  attention (report to your doctor or health care professional if they continue or are bothersome): diarrhea fever, chills (flu-like symptoms) headaches nausea, vomiting redness, stinging, or swelling at site where injected This list may not describe all possible side effects. Call your doctor for medical advice about side effects. You may report side effects to FDA at 1-800-FDA-1088. Where should I keep my medication? Keep out of the reach of children. Store in a refrigerator between 2 and 8 degrees C (36 and 46 degrees F). Do not freeze. Do not shake. Throw away any unused portion if using a single-dose vial. Throw away any unused medicine after the expiration date. NOTE: This sheet is a summary. It may not cover all possible information. If you have questions about this medicine, talk to your doctor, pharmacist, or health care provider.  2022 [employer]vier/Gold Standard (2017-07-05 16:44:20)  

## 2020-12-21 NOTE — Progress Notes (Signed)
Hematology and Oncology Follow Up Visit  [name] [id number] Jun 11, 1956 65 y.o. 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 Unfortunately after her ablation in May for atrial fib she developed a retroperitoneal bleed and hematoma. She received 1 unit of blood during admission. Her Hgb continue to improve and is now 10.6, MCV 94. She also required IV magnesium replacement.  She is symptomatic with fatigue and weakness stating she wants to sleep all the time.  She has not noted any obvious blood loss. No bruising or petechiae.  Her abdominal pain is slowly improving.  No fever, chills, n/v, cough, rash, dizziness, SOB, chest pain, palpitations, abdominal pain or changes in bowel or bladder habits.  No swelling, tenderness, numbness or tingling in her extremities at this time.  No falls or syncope to report.  She has a good appetite and is doing her best to stay well hydrated. Her weight is stable at 157 lbs.   ECOG Performance Status: 1 - Symptomatic but completely ambulatory  Medications:  Allergies as of 12/21/2020       Reactions   Pneumococcal Vaccines Other (See Comments)   Really high fever, and flu symptoms. MD instructed not to give   Cleocin [clindamycin Hcl] Other (See Comments)   "irritated my esophagus"   Morphine And Related Nausea And Vomiting   Other Nausea And Vomiting, Swelling   Shrimp if eat a lot   Shellfish-derived Products Other (See Comments)   Patient stated she is allergic to "shrimp," if she eats "a lot"   Ativan [lorazepam] Other (See Comments)   Difficulty waking   Buprenorphine Hcl Nausea And Vomiting   Pt does not know if she has ever had this  medication        Medication List        Accurate as of December 21, 2020  3:38 PM. If you have any questions, ask your nurse or doctor.          acetaminophen 500 MG tablet Commonly known as: TYLENOL Take 500 mg by mouth every 6 (six) hours as needed for mild pain, headache or fever.   albuterol 108 (90 Base) MCG/ACT inhaler Commonly known as: VENTOLIN HFA Inhale 2 puffs into the lungs every 6 (six) hours as needed for wheezing or shortness of breath.   amiodarone 200 MG tablet Commonly known as: PACERONE Take 400 mg (2 tablets) twice daily for 7 days, then reduce to 200 mg (1 tablet) daily thereafter.   atorvastatin 10 MG tablet Commonly known as: LIPITOR Take 10 mg by mouth at bedtime.   carvedilol 6.25 MG tablet Commonly known as: COREG Take 6.25 mg by mouth 2 (two) times daily with a meal. Patient will take 1 tablet (6.25 mg) by mouth 3 times daily if in afib   cholecalciferol 25 MCG (1000 UNIT) tablet Commonly known as: VITAMIN D Take 1,000 Units by mouth in the morning.   ciprofloxacin 500 MG tablet Commonly known as: CIPRO SMARTSIG:1 Tablet(s) By Mouth Every 12 Hours   COLLAGEN PO Take 1 capsule by mouth daily.   Eliquis 5 MG Tabs tablet Generic drug: apixaban Take 1 tablet (5 mg  total) by mouth 2 (two) times daily. Resume on 12/15/20.   fluticasone 50 MCG/ACT nasal spray Commonly known as: FLONASE Place 1 spray into both nostrils daily as needed for allergies or rhinitis.   furosemide 20 MG tablet Commonly known as: LASIX Take 20 mg by mouth daily as needed for edema (weight gain of 2 lbs or more x 2 days).   glipiZIDE 10 MG 24 hr tablet Commonly known as: GLUCOTROL XL Take 10 mg by mouth in the morning and at bedtime.   Glucagon Emergency 1 MG Kit Inject 1 mg into the muscle as needed (When glucose drop low levels).   loratadine 10 MG tablet Commonly known as: CLARITIN Take 10 mg by mouth in the morning.   montelukast 10 MG tablet Commonly  known as: SINGULAIR Take 10 mg by mouth at bedtime.   omeprazole 20 MG capsule Commonly known as: PRILOSEC Take 20 mg by mouth in the morning.   OVER THE COUNTER MEDICATION Take 2 capsules by mouth 2 (two) times daily. Glucosil for blood sugar   sertraline 50 MG tablet Commonly known as: ZOLOFT Take 50 mg by mouth in the morning.   sodium bicarbonate 650 MG tablet Take 650 mg by mouth 2 (two) times daily.   solifenacin 5 MG tablet Commonly known as: VESICARE Take 5 mg by mouth daily.   vitamin C 1000 MG tablet Take 1,000 mg by mouth in the morning.   VITAMIN E PO Take 1 capsule by mouth daily.   Zinc 50 MG Tabs Take 50 mg by mouth in the morning.        Allergies:  Allergies  Allergen Reactions   Pneumococcal Vaccines Other (See Comments)    Really high fever, and flu symptoms. MD instructed not to give   Cleocin [Clindamycin Hcl] Other (See Comments)    "irritated my esophagus"   Morphine And Related Nausea And Vomiting   Other Nausea And Vomiting and Swelling    Shrimp if eat a lot   Shellfish-Derived Products Other (See Comments)    Patient stated she is allergic to "shrimp," if she eats "a lot"   Ativan [Lorazepam] Other (See Comments)    Difficulty waking   Buprenorphine Hcl Nausea And Vomiting    Pt does not know if she has ever had this medication    Past Medical History, Surgical history, Social history, and Family History were reviewed and updated.  Review of Systems: All other 10 point review of systems is negative.   Physical Exam:  weight is 157 lb 12.8 oz (71.6 kg). Her oral temperature is 100.1 F (37.8 C). Her blood pressure is 133/73 and her pulse is 82. Her respiration is 18 and oxygen saturation is 98%.   Wt Readings from Last 3 Encounters:  12/21/20 157 lb 12.8 oz (71.6 kg)  12/08/20 157 lb 3 oz (71.3 kg)  11/25/20 153 lb (69.4 kg)    Ocular: Sclerae unicteric, pupils equal, round and reactive to light Ear-nose-throat: Oropharynx  clear, dentition fair Lymphatic: No cervical or supraclavicular adenopathy Lungs no rales or rhonchi, good excursion bilaterally Heart regular rate and rhythm, no murmur appreciated Abd soft, nontender, positive bowel sounds MSK no focal spinal tenderness, no joint edema Neuro: non-focal, well-oriented, appropriate affect Breasts: Deferred  Lab Results  Component Value Date   WBC 9.3 12/21/2020   HGB 10.6 (L) 12/21/2020   HCT 32.9 (L) 12/21/2020   MCV 94.8 12/21/2020   PLT 265 12/21/2020   Lab Results  Component  Value Date   FERRITIN 423 (H) 10/19/2020   IRON 89 10/19/2020   TIBC 251 10/19/2020   UIBC 162 10/19/2020   IRONPCTSAT 35 10/19/2020   Lab Results  Component Value Date   RETICCTPCT 3.4 (H) 12/21/2020   RBC 3.47 (L) 12/21/2020   RBC 3.47 (L) 12/21/2020   No results found for: Nils Pyle [facility] Lab Results  Component Value Date   IGGSERUM 789 11/15/2016   IGA 150 11/15/2016   IGMSERUM 86 11/15/2016   No results found for: Odetta Pink, SPEI   Chemistry      Component Value Date/Time   NA 137 12/21/2020 1305   NA 139 11/02/2020 1136   NA 144 04/17/2017 1150   NA 141 04/18/2016 1256   K 3.9 12/21/2020 1305   K 4.6 04/17/2017 1150   K 4.4 04/18/2016 1256   CL 99 12/21/2020 1305   CL 109 (H) 04/17/2017 1150   CO2 29 12/21/2020 1305   CO2 23 04/17/2017 1150   CO2 18 (L) 04/18/2016 1256   BUN 32 (H) 12/21/2020 1305   BUN 33 (H) 11/02/2020 1136   BUN 28 (H) 04/17/2017 1150   BUN 27.3 (H) 04/18/2016 1256   CREATININE 1.44 (H) 12/21/2020 1305   CREATININE 1.3 (H) 04/17/2017 1150   CREATININE 1.4 (H) 04/18/2016 1256      Component Value Date/Time   CALCIUM 9.7 12/21/2020 1305   CALCIUM 9.6 04/17/2017 1150   CALCIUM 9.4 04/18/2016 1256   ALKPHOS 106 12/21/2020 1305   ALKPHOS 62 04/17/2017 1150   ALKPHOS 68 04/18/2016 1256   AST 22 12/21/2020 1305   AST 14 04/18/2016 1256   ALT  17 12/21/2020 1305   ALT 20 04/17/2017 1150   ALT 23 04/18/2016 1256   BILITOT 0.6 12/21/2020 1305   BILITOT <0.22 04/18/2016 1256       Impression and Plan: Ms. Halder is a very pleasant 65 yo caucasian female with remote history of stage I ductal carcinoma of the left breast diagnosed in 2003. So far she has done well.  She now has multifactorial anemia.  ESA given for Hgb 10.6.  Iron studies are pending. We will replace if needed.  We will repeat her mammogram in October as recommended by radiology.  Follow-up in 4 weeks.  She can contact our office with any questions or concerns.   Laverna Peace, NP 6/20/20223:38 PM

## 2020-12-22 ENCOUNTER — Other Ambulatory Visit (HOSPITAL_COMMUNITY): Payer: Self-pay

## 2020-12-22 LAB — FERRITIN: Ferritin: 598 ng/mL — ABNORMAL HIGH (ref 11–307)

## 2020-12-22 LAB — IRON AND TIBC
Iron: 56 ug/dL (ref 28–170)
Saturation Ratios: 19 % (ref 10.4–31.8)
TIBC: 301 ug/dL (ref 250–450)
UIBC: 245 ug/dL

## 2020-12-23 ENCOUNTER — Ambulatory Visit (INDEPENDENT_AMBULATORY_CARE_PROVIDER_SITE_OTHER): Payer: Medicare Other | Admitting: Cardiology

## 2020-12-23 ENCOUNTER — Inpatient Hospital Stay (HOSPITAL_COMMUNITY): Admission: RE | Admit: 2020-12-23 | Payer: Medicare Other | Source: Ambulatory Visit | Admitting: Nurse Practitioner

## 2020-12-23 ENCOUNTER — Encounter: Payer: Self-pay | Admitting: Cardiology

## 2020-12-23 ENCOUNTER — Encounter (HOSPITAL_COMMUNITY): Payer: Self-pay

## 2020-12-23 ENCOUNTER — Other Ambulatory Visit: Payer: Self-pay

## 2020-12-23 VITALS — BP 104/60 | HR 68 | Ht 59.0 in | Wt 157.0 lb

## 2020-12-23 DIAGNOSIS — E669 Obesity, unspecified: Secondary | ICD-10-CM

## 2020-12-23 DIAGNOSIS — I10 Essential (primary) hypertension: Secondary | ICD-10-CM | POA: Diagnosis not present

## 2020-12-23 DIAGNOSIS — E1169 Type 2 diabetes mellitus with other specified complication: Secondary | ICD-10-CM | POA: Diagnosis not present

## 2020-12-23 DIAGNOSIS — I340 Nonrheumatic mitral (valve) insufficiency: Secondary | ICD-10-CM

## 2020-12-23 DIAGNOSIS — I480 Paroxysmal atrial fibrillation: Secondary | ICD-10-CM | POA: Diagnosis not present

## 2020-12-23 MED ORDER — AMIODARONE HCL 200 MG PO TABS: 200.0000 mg | ORAL_TABLET | Freq: Every day | ORAL | 3 refills | Status: DC

## 2020-12-23 NOTE — Progress Notes (Signed)
Cardiology Office Note:    Date:  12/23/2020   ID:  Alexandra, [name] Apr 03, 1956, MRN [id number]  PCP:  Enid Skeens., MD  Cardiologist:  Jenne Campus, MD    Referring MD: Enid Skeens., MD   Chief Complaint  Patient presents with   S/p Ablation    11/25/2020    History of Present Illness:    [name] is a 65 y.o. female with past medical history significant for paroxysmal atrial fibrillation, she is anticoagulated, her CHADS2 Vascor equals 4, also cardiomyopathy which was felt not to be ischemic ejection fraction 40 to 45%, moderate mitral regurgitation.  She did have TEE done which also showed pulmonary hypertension with pulmonary pressure of 54 mmHg.  About a month ago she had atrial fibrillation ablation done.  She did quite well initially however a few weeks later she showed up to the emergency room feeling very weak tired and exhausted.  She was found to have significant anemia, she did have retroperitoneal bleed.  That being addressed with blood transfusion as well as iron supplementation.  She was also find to be back in atrial fibrillation at that time.  Amiodarone has been given and she converted to sinus rhythm.  She was loaded with amiodarone.  She comes today to my office for follow-up.  Still complaining of being weak tired and exhausted.  Denies have any palpitations.  She is back on anticoagulation.  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 difficile colitis    Calculus of kidney 08/12/2012   CHF (congestive heart failure) ([place])    "mild" (05/15/2013)   CHF (congestive heart failure) ([place])    "mild" (05/15/2013)   Chronic bronchitis (HCC)    Chronic lower back pain    Chronic non-seasonal allergic rhinitis 09/24/4008   Chronic systolic congestive heart failure (HCC)    Chronic UTI    Cough variant asthma vs uacs  07/24/2017   Onset in her 20s some better p rx with allergy shots in her 30s Singulair trial 11/15/16 >  Improved 01/18/17:  FEV1 1.29 L (61%)  Ratio 75  Still on ACEi as of 07/24/2017  Spirometry 06/28/18   FEV1 1.3 (61%)  Ratio 77 with min curvature   - Allergy profile 07/26/2018 >  Eos 1.2 /  IgE  420  RAST Pos mold only    Cough variant asthma vs uacs  07/24/2017   Onset in her 20s some better p rx with allergy shots in her 30s Singulair trial 11/15/16 >  Improved 01/18/17:  FEV1 1.29 L (61%)  Ratio 75  Still on ACEi as of 07/24/2017  Spirometry 06/28/18   FEV1 1.3 (61%)  Ratio 77 with min curvature   - Allergy profile 07/26/2018 >  Eos 1.2 /  IgE  420  RAST Pos mold only    Diabetes mellitus type 2 in obese ([place])  Diarrhea 11/15/2016   Dilated cardiomyopathy ([place]) 07/16/2019   DOE (dyspnea on exertion) 07/27/2018   Onset early 2019 assoc with uacs while on ACEi - 06/28/18    Walked RA x one lap =  approx 250 ft - stopped due to sob with sats still high 90's      Dysphagia    Dysrhythmia    atrial fib/dr [facility]   Family history of anesthesia complication    "my mother also had PONV" (05/15/2013)   Fever 10/29/2017   Foot pain 04/09/2018   GERD (gastroesophageal reflux disease)    Heart murmur    High cholesterol    History of breast cancer in female 07/19/2013   HTN (hypertension) 05/08/2013   Hydronephrosis of right kidney    Hyperlipidemia 11/15/2016   Hypomagnesemia    Hyponatremia 10/29/2017   IDA (iron deficiency anemia) 04/19/2017   Infection    Kidney  stones    Left breast mass 07/19/2013   Low back pain 05/01/2018   Migraine    "haven't had one in the early 2000's" (05/15/2013)   Mitral regurgitation 09/03/2020   Mixed incontinence 07/24/2018   Nephrolithiasis 11/15/2016   Other chronic cystitis 05/19/2011   PAF (paroxysmal atrial fibrillation) ([place])    Personal history of chemotherapy    Personal history of radiation therapy    Pneumonia    "used to be chronic; last time I had it was 2013" (05/15/2013)   PONV (postoperative nausea and vomiting)    Recurrent sinusitis 09/08/2016   Recurrent UTI 11/20/2014   Recurrent UTI (urinary tract infection)    "from the continuous kidney stones; take Macrodantin qd" (05/15/2013)   Respiratory failure requiring intubation ([place]) 05/08/2013   Restrictive lung disease 01/18/2017   Retroperitoneal bleed 12/05/2020   Sacroiliac joint pain 05/16/2018   Sepsis ([place]) 05   kidney stone infection   Septic shock ([place]) 09/23/2018   Simple chronic bronchitis ([place]) 11/15/2016   Strep throat 10/29/2017   Tachypnea    Tension headache    Type II diabetes mellitus ([place])    Type II or unspecified type diabetes mellitus without mention of complication, not stated as uncontrolled 05/11/2013   Urinary tract stones 05/19/2011   Urinary urgency 07/24/2018    Past Surgical History:  Procedure Laterality Date   ATRIAL FIBRILLATION ABLATION N/A 11/25/2020   Procedure: ATRIAL FIBRILLATION ABLATION;  Surgeon: Constance Haw, MD;  Location: [facility];  Service: Cardiovascular;  Laterality: N/A;   BILATERAL OOPHORECTOMY Bilateral 2006   "cause I needed to 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" (05/15/2013)   BREAST BIOPSY Left 02/2001   BREAST LUMPECTOMY Left 02/2001   BREAST LUMPECTOMY Left 09/23/2013   Procedure: LEFT LUMPECTOMY WITH SPECIMEN MAMMOGRAM;  Surgeon: Adin Hector, MD;  Location: [facility];  Service: General;  Laterality: Left;   BUBBLE STUDY  11/24/2020   Procedure:  BUBBLE STUDY;  Surgeon: Geralynn Rile, MD;  Location: [facility];  Service: Cardiovascular;;   COLONOSCOPY     DIAGNOSTIC LAPAROSCOPY     cyst-near ovary   LITHOTRIPSY     "2-3 times prior to 2002" (05/15/2013)   SPINAL FUSION N/A 05/08/2013   Procedure: T9-S1 INSTRUMENTED FUSION T12 -S1 DECOMPRESSION;  Surgeon: Melina Schools, MD;  Location: [facility];  Service: Orthopedics;  Laterality: N/A;   TEE WITHOUT CARDIOVERSION N/A 11/06/2020   Procedure: TRANSESOPHAGEAL ECHOCARDIOGRAM (TEE);  Surgeon: Fay Records, MD;  Location: [facility];  Service:  Cardiovascular;  Laterality: N/A;   TEE WITHOUT CARDIOVERSION N/A 11/24/2020   Procedure: TRANSESOPHAGEAL ECHOCARDIOGRAM (TEE);  Surgeon: Geralynn Rile, MD;  Location: [facility];  Service: Cardiovascular;  Laterality: N/A;   TUBAL LIGATION Bilateral 1985    Current Medications: Current Meds  Medication Sig   acetaminophen (TYLENOL) 500 MG tablet Take 500 mg by mouth every 6 (six) hours as needed for mild pain, headache or fever.   albuterol (VENTOLIN HFA) 108 (90 Base) MCG/ACT inhaler Inhale 2 puffs into the lungs every 6 (six) hours as needed for wheezing or shortness of breath.   amiodarone (PACERONE) 200 MG tablet Take 400 mg (2 tablets) twice daily for 7 days, then reduce to 200 mg (1 tablet) daily thereafter. (Patient taking differently: Take 200 mg by mouth 2 (two) times daily. Take 400 mg (2 tablets) twice daily for 7 days, then reduce to 200 mg (1 tablet) daily thereafter.)   apixaban (ELIQUIS) 5 MG TABS tablet Take 1 tablet (5 mg total) by mouth 2 (two) times daily. Resume on 12/15/20.   Ascorbic Acid (VITAMIN C) 1000 MG tablet Take 1,000 mg by mouth in the morning.   atorvastatin (LIPITOR) 10 MG tablet Take 10 mg by mouth at bedtime.   carvedilol (COREG) 6.25 MG tablet Take 6.25 mg by mouth 2 (two) times daily with a meal. Patient will take 1 tablet (6.25 mg) by mouth 3 times daily if in afib   cholecalciferol (VITAMIN D) 25  MCG (1000 UNIT) tablet Take 1,000 Units by mouth in the morning.   COLLAGEN PO Take 1 capsule by mouth daily. Unknown strength   fluticasone (FLONASE) 50 MCG/ACT nasal spray Place 1 spray into both nostrils daily as needed for allergies or rhinitis.   furosemide (LASIX) 20 MG tablet Take 20 mg by mouth daily as needed for edema (weight gain of 2 lbs or more x 2 days).   glipiZIDE (GLUCOTROL XL) 10 MG 24 hr tablet Take 10 mg by mouth in the morning and at bedtime.    glucagon (GLUCAGON EMERGENCY) 1 MG injection Inject 1 mg into the muscle as needed (When glucose drop low levels).   loratadine (CLARITIN) 10 MG tablet Take 10 mg by mouth in the morning.   montelukast (SINGULAIR) 10 MG tablet Take 10 mg by mouth at bedtime.   omeprazole (PRILOSEC) 20 MG capsule Take 20 mg by mouth in the morning.   sertraline (ZOLOFT) 50 MG tablet Take 50 mg by mouth in the morning.   sodium bicarbonate 650 MG tablet Take 650 mg by mouth 2 (two) times daily.   solifenacin (VESICARE) 5 MG tablet Take 5 mg by mouth daily.   VITAMIN E PO Take 1 capsule by mouth daily. Unknown strength   Zinc 50 MG TABS Take 50 mg by mouth in the morning.     Allergies:   Pneumococcal vaccines, Cleocin [clindamycin hcl], Morphine and related, Other, Shellfish-derived products, Ativan [lorazepam], and Buprenorphine hcl   Social History   Socioeconomic History   Marital status: Married    Spouse name: Not on file   Number of children: Not on file   Years of education: Not on file   Highest education level: Not on file  Occupational History   Not on file  Tobacco Use   Smoking status: Never   Smokeless tobacco: Never   Tobacco comments:    Mother & Father smoked  Vaping Use   Vaping Use: Never used  Substance and Sexual Activity   Alcohol  use: No    Alcohol/week: 0.0 standard drinks   Drug use: No   Sexual activity: Yes  Other Topics Concern   Not on file  Social History Narrative   [facility] (11/15/16):    Patient's originally from [place]. Has always lived in [place]. Currently has an outside dog. Remote exposure to [place] when her children were young. No known mold exposure. 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    Social Determinants of Health   Financial Resource Strain: Not on file  Food Insecurity: Not on file  Transportation Needs: Not on file  Physical Activity: Not on file  Stress: Not on file  Social Connections: Not on file     Family History: The patient's family history includes Breast cancer in her mother; COPD in her mother; Environmental Allergies in her son and another family member; Heart disease in her mother; Kidney cancer in her father; Liver cancer in her brother; Lupus in an other family member. ROS:   Please see the history of present illness.    All 14 point review of systems negative except as described per history of present illness  EKGs/Labs/Other Studies Reviewed:      Recent Labs: 09/01/2020: Pro B Natriuretic peptide (BNP) 1,085.0 12/07/2020: Magnesium 1.9 12/21/2020: ALT 17; BUN 32; Creatinine 1.44; Hemoglobin 10.6; Platelet Count 265; Potassium 3.9; Sodium 137  Recent Lipid Panel    Component Value Date/Time   TRIG 155 (H) 05/08/2013 2023    Physical Exam:    VS:  BP 104/60 (BP Location: Right Arm, Patient Position: Sitting)   Pulse 68   Ht 4\' 11"  (1.499 m)   Wt 157 lb (71.2 kg)   SpO2 93%   BMI 31.71 kg/m     Wt Readings from Last 3 Encounters:  12/23/20 157 lb (71.2 kg)  12/21/20 157 lb 12.8 oz (71.6 kg)  12/08/20 157 lb 3 oz (71.3 kg)     GEN:  Well nourished, well developed in no acute distress HEENT: Normal NECK: No JVD; No carotid bruits LYMPHATICS: No lymphadenopathy CARDIAC: RRR, no murmurs, no rubs, no gallops RESPIRATORY:  Clear to auscultation without rales, wheezing or rhonchi  ABDOMEN: Soft, non-tender, non-distended MUSCULOSKELETAL:  No edema; No deformity   SKIN: Warm and dry LOWER EXTREMITIES: no swelling NEUROLOGIC:  Alert and oriented x 3 PSYCHIATRIC:  Normal affect   ASSESSMENT:    1. Primary hypertension   2. PAF (paroxysmal atrial fibrillation) ([place])   3. Nonrheumatic mitral valve regurgitation   4. Diabetes mellitus type 2 in obese [facility])    PLAN:    In order of problems listed above:  Status post atrial fibrillation ablation, she got 1 episode of atrial fibrillation successfully suppressed with amiodarone.  She did have retroperitoneal bleed but that seems to be stopped by now.  She is back on anticoagulation.  She did receive iron be given by hematologist.  We will put order for H&H to be checked anytime she feels weak and tired ask her to come and have it checked.  She is scheduled already to have it done as well by hematologist. Paroxysmal atrial fibrillation without recurrence after ablation.  Amiodarone is on board.  We will continue to her milligrams daily, continue anticoagulation Nonrheumatic mitral valve regurgitation moderate previously.  Hope is that with ablation of atrial fibrillation and overall improvement in condition this will get better. Pulmonary hypertension.  I suspect this is most likely group 2 related to her  mitral regurgitation.  As well as CHF.  The hope is that with improvement of hemodynamics with atrial fibrillation ablation and maintaining onset of sinus rhythm things will improve. Diabetes mellitus followed by antimedicine team.   Medication Adjustments/Labs and Tests Ordered: Current medicines are reviewed at length with the patient today.  Concerns regarding medicines are outlined above.  Orders Placed This Encounter  Procedures   CBC   EKG 12-Lead   Medication changes: No orders of the defined types were placed in this encounter.   Signed, Park Liter, MD, [facility] 12/23/2020 3:44 PM    [place]

## 2020-12-23 NOTE — Addendum Note (Signed)
Addended by: Resa Miner I on: 12/23/2020 04:03 PM   Modules accepted: Orders

## 2020-12-23 NOTE — Patient Instructions (Signed)
Medication Instructions:  Your physician recommends that you continue on your current medications as directed. Please refer to the Current Medication list given to you today.  *If you need a refill on your cardiac medications before your next appointment, please call your pharmacy*   Lab Work: Your physician recommends that you return for lab work in: [place] If you have labs (blood work) drawn today and your tests are completely normal, you will receive your results only by: [place] (if you have MyChart) OR A paper copy in the mail If you have any lab test that is abnormal or we need to change your treatment, we will call you to review the results.   Testing/Procedures: None   Follow-Up: At [facility], you and your health needs are our priority.  As part of our continuing mission to provide you with exceptional heart care, we have created designated Provider Care Teams.  These Care Teams include your primary Cardiologist (physician) and Advanced Practice Providers (APPs -  Physician Assistants and Nurse Practitioners) who all work together to provide you with the care you need, when you need it.  We recommend signing up for the patient portal called "MyChart".  Sign up information is provided on this After Visit Summary.  MyChart is used to connect with patients for Virtual Visits (Telemedicine).  Patients are able to view lab/test results, encounter notes, upcoming appointments, etc.  Non-urgent messages can be sent to your provider as well.   To learn more about what you can do with MyChart, go to [email or web address].    Your next appointment:   2 month(s)  The format for your next appointment:   In Person  Provider:   Jenne Campus, MD   Other Instructions

## 2020-12-24 LAB — CBC
Hematocrit: 33.3 % — ABNORMAL LOW (ref 34.0–46.6)
Hemoglobin: 10.7 g/dL — ABNORMAL LOW (ref 11.1–15.9)
MCH: 30.1 pg (ref 26.6–33.0)
MCHC: 32.1 g/dL (ref 31.5–35.7)
MCV: 94 fL (ref 79–97)
Platelets: 270 10*3/uL (ref 150–450)
RBC: 3.55 x10E6/uL — ABNORMAL LOW (ref 3.77–5.28)
RDW: 15 % (ref 11.7–15.4)
WBC: 8.8 10*3/uL (ref 3.4–10.8)

## 2020-12-28 ENCOUNTER — Telehealth: Payer: Self-pay | Admitting: Cardiology

## 2020-12-28 NOTE — Telephone Encounter (Signed)
° °  Pt is returning call to get lab result °

## 2020-12-28 NOTE — Telephone Encounter (Signed)
Called patient informed her of results.  

## 2021-01-18 ENCOUNTER — Encounter: Payer: Self-pay | Admitting: Family

## 2021-01-18 ENCOUNTER — Inpatient Hospital Stay (HOSPITAL_BASED_OUTPATIENT_CLINIC_OR_DEPARTMENT_OTHER): Payer: Medicare Other | Admitting: Family

## 2021-01-18 ENCOUNTER — Inpatient Hospital Stay: Payer: Medicare Other

## 2021-01-18 ENCOUNTER — Other Ambulatory Visit: Payer: Self-pay

## 2021-01-18 ENCOUNTER — Inpatient Hospital Stay: Payer: Medicare Other | Attending: Family

## 2021-01-18 VITALS — BP 138/81 | HR 84 | Temp 98.5°F | Resp 20 | Wt 158.0 lb

## 2021-01-18 DIAGNOSIS — D631 Anemia in chronic kidney disease: Secondary | ICD-10-CM

## 2021-01-18 DIAGNOSIS — D509 Iron deficiency anemia, unspecified: Secondary | ICD-10-CM | POA: Diagnosis not present

## 2021-01-18 DIAGNOSIS — D508 Other iron deficiency anemias: Secondary | ICD-10-CM | POA: Diagnosis not present

## 2021-01-18 DIAGNOSIS — Z853 Personal history of malignant neoplasm of breast: Secondary | ICD-10-CM | POA: Insufficient documentation

## 2021-01-18 DIAGNOSIS — N183 Chronic kidney disease, stage 3 unspecified: Secondary | ICD-10-CM | POA: Diagnosis not present

## 2021-01-18 LAB — CBC WITH DIFFERENTIAL (CANCER CENTER ONLY)
Abs Immature Granulocytes: 0.09 10*3/uL — ABNORMAL HIGH (ref 0.00–0.07)
Basophils Absolute: 0.1 10*3/uL (ref 0.0–0.1)
Basophils Relative: 1 %
Eosinophils Absolute: 0.2 10*3/uL (ref 0.0–0.5)
Eosinophils Relative: 2 %
HCT: 37.7 % (ref 36.0–46.0)
Hemoglobin: 12.2 g/dL (ref 12.0–15.0)
Immature Granulocytes: 1 %
Lymphocytes Relative: 13 %
Lymphs Abs: 1.2 10*3/uL (ref 0.7–4.0)
MCH: 30 pg (ref 26.0–34.0)
MCHC: 32.4 g/dL (ref 30.0–36.0)
MCV: 92.9 fL (ref 80.0–100.0)
Monocytes Absolute: 0.5 10*3/uL (ref 0.1–1.0)
Monocytes Relative: 6 %
Neutro Abs: 7.2 10*3/uL (ref 1.7–7.7)
Neutrophils Relative %: 77 %
Platelet Count: 242 10*3/uL (ref 150–400)
RBC: 4.06 MIL/uL (ref 3.87–5.11)
RDW: 15.3 % (ref 11.5–15.5)
WBC Count: 9.2 10*3/uL (ref 4.0–10.5)
nRBC: 0 % (ref 0.0–0.2)

## 2021-01-18 LAB — CMP (CANCER CENTER ONLY)
ALT: 17 U/L (ref 0–44)
AST: 16 U/L (ref 15–41)
Albumin: 4.2 g/dL (ref 3.5–5.0)
Alkaline Phosphatase: 79 U/L (ref 38–126)
Anion gap: 12 (ref 5–15)
BUN: 26 mg/dL — ABNORMAL HIGH (ref 8–23)
CO2: 23 mmol/L (ref 22–32)
Calcium: 9.3 mg/dL (ref 8.9–10.3)
Chloride: 101 mmol/L (ref 98–111)
Creatinine: 1.31 mg/dL — ABNORMAL HIGH (ref 0.44–1.00)
GFR, Estimated: 45 mL/min — ABNORMAL LOW (ref >60)
Glucose, Bld: 287 mg/dL — ABNORMAL HIGH (ref 70–99)
Potassium: 3.8 mmol/L (ref 3.5–5.1)
Sodium: 136 mmol/L (ref 135–145)
Total Bilirubin: 0.3 mg/dL (ref 0.3–1.2)
Total Protein: 7.5 g/dL (ref 6.5–8.1)

## 2021-01-18 LAB — RETICULOCYTES
Immature Retic Fract: 11.5 % (ref 2.3–15.9)
RBC.: 3.99 MIL/uL (ref 3.87–5.11)
Retic Count, Absolute: 69 10*3/uL (ref 19.0–186.0)
Retic Ct Pct: 1.7 % (ref 0.4–3.1)

## 2021-01-18 NOTE — Progress Notes (Signed)
Hematology and Oncology Follow Up Visit  [name] [id number] [birth year] 65 y.o. 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 She states that she is slowly feeling better. Her Hgb is now up to 12.2, MCV 92 and platelets 242.  She still has some intermittent fatigue and occasional palpitations.  She has not noted any blood loss. No bruising or petechiae.  No fever, chills, n/v, cough, rash, dizziness, SOB, chest pain, abdominal pain or changes in bowel or bladder habits.  No swelling in her extremities.  She has had some burning and pain in the side of her right pinky finger that comes and goes. She is not sure if she may have inadvertently hurt it at some point.  No falls or syncope to report.  She has maintained a good appetite and is staying well hydrated. Her weight is stable at 158 lbs.    ECOG Performance Status: 1 - Symptomatic but completely ambulatory  Medications:  Allergies as of 01/18/2021       Reactions   Pneumococcal Vaccines Other (See Comments)   Really high fever, and flu symptoms. MD instructed not to give   Cleocin [clindamycin Hcl] Other (See Comments)   "irritated my esophagus"   Morphine And Related Nausea And Vomiting   Other Nausea And Vomiting, Swelling   Shrimp if eat a lot   Shellfish-derived Products Other (See Comments)   Patient stated she is allergic to "shrimp," if she eats "a lot"   Ativan [lorazepam] Other (See Comments)   Difficulty waking   Buprenorphine Hcl Nausea And Vomiting   Pt does not know if she has ever had this medication        Medication List        Accurate as of January 18, 2021  2:49 PM. If you have any questions, ask your  nurse or doctor.          acetaminophen 500 MG tablet Commonly known as: TYLENOL Take 500 mg by mouth every 6 (six) hours as needed for mild pain, headache or fever.   albuterol 108 (90 Base) MCG/ACT inhaler Commonly known as: VENTOLIN HFA Inhale 2 puffs into the lungs every 6 (six) hours as needed for wheezing or shortness of breath.   amiodarone 200 MG tablet Commonly known as: PACERONE Take 1 tablet (200 mg total) by mouth daily. Take 400 mg (2 tablets) twice daily for 7 days, then reduce to 200 mg (1 tablet) daily thereafter.   atorvastatin 10 MG tablet Commonly known as: LIPITOR Take 10 mg by mouth at bedtime.   carvedilol 6.25 MG tablet Commonly known as: COREG Take 6.25 mg by mouth 2 (two) times daily with a meal. Patient will take 1 tablet (6.25 mg) by mouth 3 times daily if in afib   cholecalciferol 25 MCG (1000 UNIT) tablet Commonly known as: VITAMIN D Take 1,000 Units by mouth in the morning.   COLLAGEN PO Take 1 capsule by mouth daily. Unknown strength   Eliquis 5 MG Tabs tablet Generic drug: apixaban Take 1 tablet (5 mg total) by mouth 2 (two) times daily. Resume on 12/15/20.   fluticasone 50 MCG/ACT nasal spray Commonly known  as: FLONASE Place 1 spray into both nostrils daily as needed for allergies or rhinitis.   furosemide 20 MG tablet Commonly known as: LASIX Take 20 mg by mouth daily as needed for edema (weight gain of 2 lbs or more x 2 days).   glipiZIDE 10 MG 24 hr tablet Commonly known as: GLUCOTROL XL Take 10 mg by mouth in the morning and at bedtime.   Glucagon Emergency 1 MG Kit Inject 1 mg into the muscle as needed (When glucose drop low levels).   Jardiance 10 MG Tabs tablet Generic drug: empagliflozin Take 10 mg by mouth every morning.   loratadine 10 MG tablet Commonly known as: CLARITIN Take 10 mg by mouth in the morning.   montelukast 10 MG tablet Commonly known as: SINGULAIR Take 10 mg by mouth at bedtime.   omeprazole 20  MG capsule Commonly known as: PRILOSEC Take 20 mg by mouth in the morning.   OVER THE COUNTER MEDICATION Take 2 capsules by mouth 2 (two) times daily. Unknown strength/Glucosil for blood sugar   sertraline 50 MG tablet Commonly known as: ZOLOFT Take 50 mg by mouth in the morning.   sodium bicarbonate 650 MG tablet Take 650 mg by mouth 2 (two) times daily.   solifenacin 5 MG tablet Commonly known as: VESICARE Take 5 mg by mouth daily.   vitamin C 1000 MG tablet Take 1,000 mg by mouth in the morning.   VITAMIN E PO Take 1 capsule by mouth daily. Unknown strength   Zinc 50 MG Tabs Take 50 mg by mouth in the morning.        Allergies:  Allergies  Allergen Reactions   Pneumococcal Vaccines Other (See Comments)    Really high fever, and flu symptoms. MD instructed not to give   Cleocin [Clindamycin Hcl] Other (See Comments)    "irritated my esophagus"   Morphine And Related Nausea And Vomiting   Other Nausea And Vomiting and Swelling    Shrimp if eat a lot   Shellfish-Derived Products Other (See Comments)    Patient stated she is allergic to "shrimp," if she eats "a lot"   Ativan [Lorazepam] Other (See Comments)    Difficulty waking   Buprenorphine Hcl Nausea And Vomiting    Pt does not know if she has ever had this medication    Past Medical History, Surgical history, Social history, and Family History were reviewed and updated.  Review of Systems: All other 10 point review of systems is negative.   Physical Exam:  weight is 158 lb (71.7 kg). Her oral temperature is 98.5 F (36.9 C). Her blood pressure is 138/81 and her pulse is 84. Her respiration is 20.   Wt Readings from Last 3 Encounters:  01/18/21 158 lb (71.7 kg)  12/23/20 157 lb (71.2 kg)  12/21/20 157 lb 12.8 oz (71.6 kg)    Ocular: Sclerae unicteric, pupils equal, round and reactive to light Ear-nose-throat: Oropharynx clear, dentition fair Lymphatic: No cervical or supraclavicular  adenopathy Lungs no rales or rhonchi, good excursion bilaterally Heart regular rate and rhythm, no murmur appreciated Abd soft, nontender, positive bowel sounds MSK no focal spinal tenderness, no joint edema Neuro: non-focal, well-oriented, appropriate affect Breasts: Deferred   Lab Results  Component Value Date   WBC 9.2 01/18/2021   HGB 12.2 01/18/2021   HCT 37.7 01/18/2021   MCV 92.9 01/18/2021   PLT 242 01/18/2021   Lab Results  Component Value Date   FERRITIN 598 (H) 12/21/2020  IRON 56 12/21/2020   TIBC 301 12/21/2020   UIBC 245 12/21/2020   IRONPCTSAT 19 12/21/2020   Lab Results  Component Value Date   RETICCTPCT 1.7 01/18/2021   RBC 4.06 01/18/2021   No results found for: [facility] Lab Results  Component Value Date   IGGSERUM 789 11/15/2016   IGA 150 11/15/2016   IGMSERUM 86 11/15/2016   No results found for: Odetta Pink, SPEI   Chemistry      Component Value Date/Time   NA 136 01/18/2021 1421   NA 139 11/02/2020 1136   NA 144 04/17/2017 1150   NA 141 04/18/2016 1256   K 3.8 01/18/2021 1421   K 4.6 04/17/2017 1150   K 4.4 04/18/2016 1256   CL 101 01/18/2021 1421   CL 109 (H) 04/17/2017 1150   CO2 23 01/18/2021 1421   CO2 23 04/17/2017 1150   CO2 18 (L) 04/18/2016 1256   BUN 26 (H) 01/18/2021 1421   BUN 33 (H) 11/02/2020 1136   BUN 28 (H) 04/17/2017 1150   BUN 27.3 (H) 04/18/2016 1256   CREATININE 1.31 (H) 01/18/2021 1421   CREATININE 1.3 (H) 04/17/2017 1150   CREATININE 1.4 (H) 04/18/2016 1256      Component Value Date/Time   CALCIUM 9.3 01/18/2021 1421   CALCIUM 9.6 04/17/2017 1150   CALCIUM 9.4 04/18/2016 1256   ALKPHOS 79 01/18/2021 1421   ALKPHOS 62 04/17/2017 1150   ALKPHOS 68 04/18/2016 1256   AST 16 01/18/2021 1421   AST 14 04/18/2016 1256   ALT 17 01/18/2021 1421   ALT 20 04/17/2017 1150   ALT 23 04/18/2016 1256   BILITOT 0.3 01/18/2021 1421    BILITOT <0.22 04/18/2016 1256       Impression and Plan: [name] is a very pleasant 65 yo caucasian female with remote history of stage I ductal carcinoma of the left breast diagnosed in 2003. So far she has done well and there has been no evidence of recurrence.  No ESA needed this visit, Hgb 12.2.  Iron studies pending. We will replace if needed.  Follow-up in 6 weeks.  She can contact our office with any questions or concerns.   Laverna Peace, NP 7/18/20222:49 PM

## 2021-01-19 ENCOUNTER — Telehealth: Payer: Self-pay | Admitting: *Deleted

## 2021-01-19 LAB — FERRITIN: Ferritin: 434 ng/mL — ABNORMAL HIGH (ref 11–307)

## 2021-01-19 LAB — IRON AND TIBC
Iron: 61 ug/dL (ref 41–142)
Saturation Ratios: 24 % (ref 21–57)
TIBC: 251 ug/dL (ref 236–444)
UIBC: 191 ug/dL (ref 120–384)

## 2021-01-19 NOTE — Telephone Encounter (Signed)
Per 01/18/21 los - gave patient upcoming appointment - view mychart - mailed calendar

## 2021-02-28 NOTE — Progress Notes (Signed)
Electrophysiology Office Note   Date:  03/01/2021   ID:  Alexandra, [name] 23-Mar-1956, MRN [id number]  PCP:  Enid Skeens., MD  Cardiologist:  Agustin Cree Primary Electrophysiologist:  Glyn Zendejas [name], MD    Chief Complaint: AF   History of Present Illness: CEAIRA [name] is a 65 y.o. female who is being seen today for the evaluation of AF at the request of [name], [name]., MD. Presenting today for electrophysiology evaluation.  She has a history significant for atrial fibrillation, cardiomyopathy with normalized ejection fraction, diastolic dysfunction, mitral valve prolapse, mild to moderate mitral regurgitation, diabetes, hyperlipidemia, breast cancer.  She is status post atrial fibrillation ablation 11/25/2020.  Unfortunately her ablation was complicated by retroperitoneal bleed.  This was treated conservatively.  She did go back into atrial fibrillation in the setting of a retroperitoneal bleed and was loaded on amiodarone.  Today, denies symptoms of palpitations, chest pain, shortness of breath, orthopnea, PND, lower extremity edema, claudication, dizziness, presyncope, syncope, bleeding, or neurologic sequela. The patient is tolerating medications without difficulties.  She she currently feels well.  She has been in sinus rhythm since her hospitalization for her retroperitoneal bleed.  She has been recovering well, and has been doing her daily activities without restriction.   Past Medical History:  Diagnosis Date   Acquired thrombophilia ([place]) 01/29/2020   AKI (acute kidney injury) ([place]) 11/20/2019   Allergy 09/08/2016   AMS (altered mental status)    Anemia in end-stage renal disease ([place]) 07/16/2019   Anemia, normocytic normochromic 07/27/2018   Ankle joint pain 04/09/2018   Arthritis    "back, knees, arms, wrists" (05/15/2013)   Asthma    Asymmetrical left sensorineural hearing loss 07/31/2019   Atrial fibrillation ([place]) 11/15/2016   Atrial fibrillation with RVR  ([place]) 11/15/2016   Breast cancer ([place])    C. difficile colitis    Calculus of kidney 08/12/2012   CHF (congestive heart failure) ([place])    "mild" (05/15/2013)   CHF (congestive heart failure) ([place])    "mild" (05/15/2013)   Chronic bronchitis ([facility])    Chronic lower back pain    Chronic non-seasonal allergic rhinitis 7/78/2423   Chronic systolic congestive heart failure (H[facility])    Chronic UTI    Cough variant asthma vs uacs  07/24/2017   Onset in her 20s some better p rx with allergy shots in her 30s Singulair trial 11/15/16 >  Improved 01/18/17:  FEV1 1.29 L (61%)  Ratio 75  Still on ACEi as of 07/24/2017  Spirometry 06/28/18   FEV1 1.3 (61%)  Ratio 77 with min curvature   - Allergy profile 07/26/2018 >  Eos 1.2 /  IgE  420  RAST Pos mold only    Cough variant asthma vs uacs  07/24/2017   Onset in her 20s some better p rx with allergy shots in her 30s Singulair trial 11/15/16 >  Improved 01/18/17:  FEV1 1.29 L (61%)  Ratio 75  Still on ACEi as of 07/24/2017  Spirometry 06/28/18   FEV1 1.3 (61%)  Ratio 77 with min curvature   - Allergy profile 07/26/2018 >  Eos 1.2 /  IgE  420  RAST Pos mold only    Diabetes mellitus type 2 in obese ([place])    Diarrhea 11/15/2016   Dilated cardiomyopathy ([place]) 07/16/2019   DOE (dyspnea on exertion) 07/27/2018   Onset early 2019 assoc with uacs while on ACEi - 06/28/18    Walked RA x one lap =  approx 250 ft - stopped due to sob with sats still high 90's      Dysphagia    Dysrhythmia    atrial fib/dr [facility]   Family history of anesthesia complication    "my mother also had PONV" (05/15/2013)   Fever 10/29/2017   Foot pain 04/09/2018   GERD (gastroesophageal reflux disease)    Heart murmur    High cholesterol    History of breast cancer in female 07/19/2013   HTN (hypertension) 05/08/2013   Hydronephrosis of right kidney    Hyperlipidemia 11/15/2016   Hypomagnesemia    Hyponatremia 10/29/2017   IDA (iron deficiency anemia) 04/19/2017   Infection     Kidney stones    Left breast mass 07/19/2013   Low back pain 05/01/2018   Migraine    "haven't had one in the early 2000's" (05/15/2013)   Mitral regurgitation 09/03/2020   Mixed incontinence 07/24/2018   Nephrolithiasis 11/15/2016   Other chronic cystitis 05/19/2011   PAF (paroxysmal atrial fibrillation) ([place])    Personal history of chemotherapy    Personal history of radiation therapy    Pneumonia    "used to be chronic; last time I had it was 2013" (05/15/2013)   PONV (postoperative nausea and vomiting)    Recurrent sinusitis 09/08/2016   Recurrent UTI 11/20/2014   Recurrent UTI (urinary tract infection)    "from the continuous kidney stones; take Macrodantin qd" (05/15/2013)   Respiratory failure requiring intubation ([place]) 05/08/2013   Restrictive lung disease 01/18/2017   Retroperitoneal bleed 12/05/2020   Sacroiliac joint pain 05/16/2018   Sepsis ([place]) 05   kidney stone infection   Septic shock ([place]) 09/23/2018   Simple chronic bronchitis ([place]) 11/15/2016   Strep throat 10/29/2017   Tachypnea    Tension headache    Type II diabetes mellitus ([place])    Type II or unspecified type diabetes mellitus without mention of complication, not stated as uncontrolled 05/11/2013   Urinary tract stones 05/19/2011   Urinary urgency 07/24/2018   Past Surgical History:  Procedure Laterality Date   ATRIAL FIBRILLATION ABLATION N/A 11/25/2020   Procedure: ATRIAL FIBRILLATION ABLATION;  Surgeon: Constance Haw, MD;  Location: [facility];  Service: Cardiovascular;  Laterality: N/A;   BILATERAL OOPHORECTOMY Bilateral 2006   "cause I needed to 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" (05/15/2013)   BREAST BIOPSY Left 02/2001   BREAST LUMPECTOMY Left 02/2001   BREAST LUMPECTOMY Left 09/23/2013   Procedure: LEFT LUMPECTOMY WITH SPECIMEN MAMMOGRAM;  Surgeon: Adin Hector, MD;  Location: [facility];  Service: General;  Laterality: Left;   BUBBLE STUDY  11/24/2020    Procedure: BUBBLE STUDY;  Surgeon: Geralynn Rile, MD;  Location: [facility];  Service: Cardiovascular;;   COLONOSCOPY     DIAGNOSTIC LAPAROSCOPY     cyst-near ovary   LITHOTRIPSY     "2-3 times prior to 2002" (05/15/2013)   SPINAL FUSION N/A 05/08/2013   Procedure: T9-S1 INSTRUMENTED FUSION T12 -S1 DECOMPRESSION;  Surgeon: Melina Schools, MD;  Location: [facility];  Service: Orthopedics;  Laterality: N/A;   TEE WITHOUT CARDIOVERSION N/A 11/06/2020   Procedure: TRANSESOPHAGEAL ECHOCARDIOGRAM (TEE);  Surgeon: Fay Records, MD;  Location: [facility];  Service: Cardiovascular;  Laterality: N/A;   TEE WITHOUT CARDIOVERSION N/A 11/24/2020   Procedure: TRANSESOPHAGEAL ECHOCARDIOGRAM (TEE);  Surgeon: Geralynn Rile, MD;  Location: [facility];  Service: Cardiovascular;  Laterality: N/A;   TUBAL LIGATION Bilateral 1985  Current Outpatient Medications  Medication Sig Dispense Refill   acetaminophen (TYLENOL) 500 MG tablet Take 500 mg by mouth every 6 (six) hours as needed for mild pain, headache or fever.     albuterol (VENTOLIN HFA) 108 (90 Base) MCG/ACT inhaler Inhale 2 puffs into the lungs every 6 (six) hours as needed for wheezing or shortness of breath.     amiodarone (PACERONE) 200 MG tablet Take 1 tablet (200 mg total) by mouth daily. Take 400 mg (2 tablets) twice daily for 7 days, then reduce to 200 mg (1 tablet) daily thereafter. 90 tablet 3   apixaban (ELIQUIS) 5 MG TABS tablet Take 1 tablet (5 mg total) by mouth 2 (two) times daily. Resume on 12/15/20. 60 tablet    Ascorbic Acid (VITAMIN C) 1000 MG tablet Take 1,000 mg by mouth in the morning.     atorvastatin (LIPITOR) 10 MG tablet Take 10 mg by mouth at bedtime.     carvedilol (COREG) 6.25 MG tablet Take 6.25 mg by mouth 2 (two) times daily with a meal. Patient Nael Petrosyan take 1 tablet (6.25 mg) by mouth 3 times daily if in afib     cholecalciferol (VITAMIN D) 25 MCG (1000 UNIT) tablet Take 1,000 Units by mouth in the morning.      COLLAGEN PO Take 1 capsule by mouth daily. Unknown strength     fluticasone (FLONASE) 50 MCG/ACT nasal spray Place 1 spray into both nostrils daily as needed for allergies or rhinitis.     furosemide (LASIX) 20 MG tablet Take 20 mg by mouth daily as needed for edema (weight gain of 2 lbs or more x 2 days).     glipiZIDE (GLUCOTROL XL) 10 MG 24 hr tablet Take 10 mg by mouth in the morning and at bedtime.      JARDIANCE 10 MG TABS tablet Take 10 mg by mouth every morning.     loratadine (CLARITIN) 10 MG tablet Take 10 mg by mouth in the morning.     montelukast (SINGULAIR) 10 MG tablet Take 10 mg by mouth at bedtime.     omeprazole (PRILOSEC) 20 MG capsule Take 20 mg by mouth in the morning.     sertraline (ZOLOFT) 50 MG tablet Take 50 mg by mouth in the morning.     sodium bicarbonate 650 MG tablet Take 650 mg by mouth 2 (two) times daily.     solifenacin (VESICARE) 5 MG tablet Take 5 mg by mouth daily.     VITAMIN E PO Take 1 capsule by mouth daily. Unknown strength     Zinc 50 MG TABS Take 50 mg by mouth in the morning.     No current facility-administered medications for this visit.    Allergies:   Pneumococcal vaccines, Cleocin [clindamycin hcl], Morphine and related, Other, Shellfish-derived products, Ativan [lorazepam], and Buprenorphine hcl   Social History:  The patient  reports that she has never smoked. She has never used smokeless tobacco. She reports that she does not drink alcohol and does not use drugs.   Family History:  The patient's family history includes Breast cancer in her mother; COPD in her mother; Environmental Allergies in her son and another family member; Heart disease in her mother; Kidney cancer in her father; Liver cancer in her brother; Lupus in an other family member.   ROS:  Please see the history of present illness.   Otherwise, review of systems is positive for none.   All other systems are reviewed and negative.  PHYSICAL EXAM: VS:  BP 122/74   Pulse 87    Ht 4\' 11"  (1.499 m)   Wt 152 lb (68.9 kg)   SpO2 95%   BMI 30.70 kg/m  , BMI Body mass index is 30.7 kg/m. GEN: Well nourished, well developed, in no acute distress  HEENT: normal  Neck: no JVD, carotid bruits, or masses Cardiac: RRR; no murmurs, rubs, or gallops,no edema  Respiratory:  clear to auscultation bilaterally, normal work of breathing GI: soft, nontender, nondistended, + BS MS: no deformity or atrophy  Skin: warm and dry Neuro:  Strength and sensation are intact Psych: euthymic mood, full affect  EKG:  EKG is ordered today. Personal review of the ekg ordered shows sinus rhythm  Recent Labs: 09/01/2020: Pro B Natriuretic peptide (BNP) 1,085.0 12/07/2020: Magnesium 1.9 01/18/2021: ALT 17; BUN 26; Creatinine 1.31; Hemoglobin 12.2; Platelet Count 242; Potassium 3.8; Sodium 136    Lipid Panel     Component Value Date/Time   TRIG 155 (H) 05/08/2013 2023     Wt Readings from Last 3 Encounters:  03/01/21 152 lb (68.9 kg)  03/01/21 152 lb 6.4 oz (69.1 kg)  01/18/21 158 lb (71.7 kg)      Other studies Reviewed: Additional studies/ records that were reviewed today include: TTE 01/13/20  Review of the above records today demonstrates:   1. Left ventricular ejection fraction, by estimation, is 60 to 65%. The  left ventricle has normal function. The left ventricle has no regional  wall motion abnormalities. There is severe left ventricular hypertrophy of  the septal segment. Left  ventricular diastolic parameters are consistent with Grade II diastolic  dysfunction (pseudonormalization).   2. Right ventricular systolic function is normal. The right ventricular  size is normal. There is normal pulmonary artery systolic pressure.   3. There is mild to moderate mitral annular calcification. Mild anterior  leaflet prolapse. Mild to moderate posterior directed mitral valve  regurgitation. No evidence of mitral stenosis.   4. Tricuspid valve regurgitation is moderate.   5.  Suspect a unicuspid valve. Aortic valve regurgitation is not  visualized. No aortic stenosis is present.   6. The inferior vena cava is normal in size with greater than 50%  respiratory variability, suggesting right atrial pressure of 3 mmHg.    ASSESSMENT AND PLAN:  1.  Paroxysmal atrial fibrillation: Currently on carvedilol, amiodarone, Eliquis.  CHA2DS2-VASc of 4.  Is status post ablation 11/25/2020.  High risk medication monitoring.  I Abbigal Radich leave her on her amiodarone for another 3 months that she continues to recover from her retroperitoneal bleed.  2.  Dilated cardiomyopathy: Ejection fraction is improved back to normal.  Continues to have intermittent episodes of volume overload.  Plan per primary care.  3.  Hypertension: Currently well controlled  4.  Retroperitoneal bleed: Occurred post atrial fibrillation ablation.  She is recovering well not having any long-term effects.  Current medicines are reviewed at length with the patient today.   The patient does not have concerns regarding her medicines.  The following changes were made today: None  Labs/ tests ordered today include:  Orders Placed This Encounter  Procedures   EKG 12-Lead      Disposition:   FU with Jowan Skillin 3 months  Signed, Noya Santarelli [name], MD  03/01/2021 11:50 AM     [address] [place] [address] [phone] (office) [phone] (fax)'

## 2021-03-01 ENCOUNTER — Inpatient Hospital Stay: Payer: Medicare Other | Admitting: Family

## 2021-03-01 ENCOUNTER — Telehealth: Payer: Self-pay | Admitting: *Deleted

## 2021-03-01 ENCOUNTER — Inpatient Hospital Stay: Payer: Medicare Other

## 2021-03-01 ENCOUNTER — Ambulatory Visit (INDEPENDENT_AMBULATORY_CARE_PROVIDER_SITE_OTHER): Payer: Medicare Other | Admitting: Cardiology

## 2021-03-01 ENCOUNTER — Other Ambulatory Visit: Payer: Self-pay

## 2021-03-01 ENCOUNTER — Encounter: Payer: Self-pay | Admitting: Cardiology

## 2021-03-01 VITALS — BP 122/74 | HR 87 | Ht 59.0 in | Wt 152.4 lb

## 2021-03-01 VITALS — BP 122/74 | HR 87 | Ht 59.0 in | Wt 152.0 lb

## 2021-03-01 DIAGNOSIS — I10 Essential (primary) hypertension: Secondary | ICD-10-CM

## 2021-03-01 DIAGNOSIS — I480 Paroxysmal atrial fibrillation: Secondary | ICD-10-CM

## 2021-03-01 DIAGNOSIS — I420 Dilated cardiomyopathy: Secondary | ICD-10-CM

## 2021-03-01 MED ORDER — AMLODIPINE BESYLATE 2.5 MG PO TABS: 2.5000 mg | ORAL_TABLET | Freq: Every day | ORAL | 1 refills | Status: DC

## 2021-03-01 NOTE — Telephone Encounter (Signed)
Patient called to reschedule appointment due to not feeling well-  - Called and lvm of rescheduled appointment for 03/31/21 - requested call back to confirm

## 2021-03-01 NOTE — Patient Instructions (Signed)
Medication Instructions:  Your physician has recommended you make the following change in your medication: Start Amlodipine 2.5 mg once daily. *If you need a refill on your cardiac medications before your next appointment, please call your pharmacy*   Lab Work: None If you have labs (blood work) drawn today and your tests are completely normal, you will receive your results only by: [place] (if you have MyChart) OR A paper copy in the mail If you have any lab test that is abnormal or we need to change your treatment, we will call you to review the results.   Testing/Procedures: Your physician has requested that you have an echocardiogram in 3 months. Echocardiography is a painless test that uses sound waves to create images of your heart. It provides your doctor with information about the size and shape of your heart and how well your heart's chambers and valves are working. This procedure takes approximately one hour. There are no restrictions for this procedure.    Follow-Up: At [facility], you and your health needs are our priority.  As part of our continuing mission to provide you with exceptional heart care, we have created designated Provider Care Teams.  These Care Teams include your primary Cardiologist (physician) and Advanced Practice Providers (APPs -  Physician Assistants and Nurse Practitioners) who all work together to provide you with the care you need, when you need it.  We recommend signing up for the patient portal called "MyChart".  Sign up information is provided on this After Visit Summary.  MyChart is used to connect with patients for Virtual Visits (Telemedicine).  Patients are able to view lab/test results, encounter notes, upcoming appointments, etc.  Non-urgent messages can be sent to your provider as well.   To learn more about what you can do with MyChart, go to [email or web address].    Your next appointment:   6 month(s)  The format for your next  appointment:   In Person  Provider:   Jenne Campus, MD   Other Instructions

## 2021-03-01 NOTE — Progress Notes (Signed)
**Note Alexandra-Identified via Obfuscation** Cardiology Office Note:    Date:  03/01/2021   ID:  [name], [name] 26-Aug-1955, MRN [id number]  PCP:  Alexandra Skeens., MD  Cardiologist:  Alexandra Campus, MD    Referring MD: Alexandra Skeens., MD   Chief Complaint  Patient presents with   Follow-up  Status post atrial fibrillation ablation  History of Present Illness:    [name] is a 65 y.o. female with past medical history significant for paroxysmal atrial fibrillation she is anticoagulant with CHADS2 Vascor equals 4.  Also cardiomyopathy with ejection fraction 40 to 45%, moderate mitral regurgitation, also TEE showed some pulmonary hypertension with pulmonary pressure number 254 mmHg.  She did have atrial fibrillation ablation done which was complicated by retroperitoneal bleed.  She required some iron infusion did well.  Also she got another episode of atrial fibrillation required amiodarone.  She is on amiodarone right now with intention to in the future decrease dose of amiodarone.  She comes today to my office for follow-up overall she is doing well.  She denies have any chest pain tightness squeezing pressure burning chest.  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 difficile colitis    Calculus of kidney 08/12/2012   CHF (congestive heart failure) ([name])    "mild" (05/15/2013)   CHF (congestive heart failure) ([name])    "mild" (05/15/2013)   Chronic bronchitis ([name])    Chronic lower  back pain    Chronic non-seasonal allergic rhinitis 4/70/9628   Chronic systolic congestive heart failure ([name])    Chronic UTI    Cough variant asthma vs uacs  07/24/2017   Onset in her 20s some better p rx with allergy shots in her 30s Singulair trial 11/15/16 >  Improved 01/18/17:  FEV1 1.29 L (61%)  Ratio 75  Still on ACEi as of 07/24/2017  Spirometry 06/28/18   FEV1 1.3 (61%)  Ratio 77 with min curvature   - Allergy profile 07/26/2018 >  Eos 1.2 /  IgE  420  RAST Pos mold only    Cough variant asthma vs uacs  07/24/2017   Onset in her 20s some better p rx with allergy shots in her 30s Singulair trial 11/15/16 >  Improved 01/18/17:  FEV1 1.29 L (61%)  Ratio 75  Still on ACEi as of 07/24/2017  Spirometry 06/28/18   FEV1 1.3 (61%)  Ratio 77 with min curvature   - Allergy profile 07/26/2018 >  Eos 1.2 /  IgE  420  RAST Pos mold only    Diabetes mellitus type 2 in obese ([name])    Diarrhea 11/15/2016   Dilated cardiomyopathy ([name]) 07/16/2019   DOE (dyspnea on exertion) 07/27/2018   Onset early 2019 assoc with uacs while on ACEi - 06/28/18    Walked RA x one lap =  approx 250 ft - stopped due to sob with sats still high 90's      Dysphagia  Dysrhythmia    atrial fib/dr [facility]   Family history of anesthesia complication    "my mother also had PONV" (05/15/2013)   Fever 10/29/2017   Foot pain 04/09/2018   GERD (gastroesophageal reflux disease)    Heart murmur    High cholesterol    History of breast cancer in female 07/19/2013   HTN (hypertension) 05/08/2013   Hydronephrosis of right kidney    Hyperlipidemia 11/15/2016   Hypomagnesemia    Hyponatremia 10/29/2017   IDA (iron deficiency anemia) 04/19/2017   Infection    Kidney stones    Left breast mass 07/19/2013   Low back pain 05/01/2018   Migraine    "haven't had one in the early 2000's" (05/15/2013)   Mitral regurgitation 09/03/2020   Mixed incontinence 07/24/2018   Nephrolithiasis 11/15/2016   Other chronic cystitis 05/19/2011    PAF (paroxysmal atrial fibrillation) ([facility])    Personal history of chemotherapy    Personal history of radiation therapy    Pneumonia    "used to be chronic; last time I had it was 2013" (05/15/2013)   PONV (postoperative nausea and vomiting)    Recurrent sinusitis 09/08/2016   Recurrent UTI 11/20/2014   Recurrent UTI (urinary tract infection)    "from the continuous kidney stones; take Macrodantin qd" (05/15/2013)   Respiratory failure requiring intubation ([name]) 05/08/2013   Restrictive lung disease 01/18/2017   Retroperitoneal bleed 12/05/2020   Sacroiliac joint pain 05/16/2018   Sepsis ([name]) 05   kidney stone infection   Septic shock ([name]) 09/23/2018   Simple chronic bronchitis ([name]) 11/15/2016   Strep throat 10/29/2017   Tachypnea    Tension headache    Type II diabetes mellitus ([name])    Type II or unspecified type diabetes mellitus without mention of complication, not stated as uncontrolled 05/11/2013   Urinary tract stones 05/19/2011   Urinary urgency 07/24/2018    Past Surgical History:  Procedure Laterality Date   ATRIAL FIBRILLATION ABLATION N/A 11/25/2020   Procedure: ATRIAL FIBRILLATION ABLATION;  Surgeon: Constance Haw, MD;  Location: [facility];  Service: Cardiovascular;  Laterality: N/A;   BILATERAL OOPHORECTOMY Bilateral 2006   "cause I needed to 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" (05/15/2013)   BREAST BIOPSY Left 02/2001   BREAST LUMPECTOMY Left 02/2001   BREAST LUMPECTOMY Left 09/23/2013   Procedure: LEFT LUMPECTOMY WITH SPECIMEN MAMMOGRAM;  Surgeon: Adin Hector, MD;  Location: [facility];  Service: General;  Laterality: Left;   BUBBLE STUDY  11/24/2020   Procedure: BUBBLE STUDY;  Surgeon: Geralynn Rile, MD;  Location: [facility];  Service: Cardiovascular;;   COLONOSCOPY     DIAGNOSTIC LAPAROSCOPY     cyst-near ovary   LITHOTRIPSY     "2-3 times prior to 2002" (05/15/2013)   SPINAL FUSION N/A 05/08/2013    Procedure: T9-S1 INSTRUMENTED FUSION T12 -S1 DECOMPRESSION;  Surgeon: Melina Schools, MD;  Location: [facility];  Service: Orthopedics;  Laterality: N/A;   TEE WITHOUT CARDIOVERSION N/A 11/06/2020   Procedure: TRANSESOPHAGEAL ECHOCARDIOGRAM (TEE);  Surgeon: Fay Records, MD;  Location: [facility];  Service: Cardiovascular;  Laterality: N/A;   TEE WITHOUT CARDIOVERSION N/A 11/24/2020   Procedure: TRANSESOPHAGEAL ECHOCARDIOGRAM (TEE);  Surgeon: Geralynn Rile, MD;  Location: [facility];  Service: Cardiovascular;  Laterality: N/A;   TUBAL LIGATION Bilateral 1985    Current Medications: Current Meds  Medication Sig   acetaminophen (TYLENOL) 500 MG tablet Take 500 mg by  mouth every 6 (six) hours as needed for mild pain, headache or fever.   albuterol (VENTOLIN HFA) 108 (90 Base) MCG/ACT inhaler Inhale 2 puffs into the lungs every 6 (six) hours as needed for wheezing or shortness of breath.   amiodarone (PACERONE) 200 MG tablet Take 1 tablet (200 mg total) by mouth daily. Take 400 mg (2 tablets) twice daily for 7 days, then reduce to 200 mg (1 tablet) daily thereafter.   apixaban (ELIQUIS) 5 MG TABS tablet Take 1 tablet (5 mg total) by mouth 2 (two) times daily. Resume on 12/15/20.   Ascorbic Acid (VITAMIN C) 1000 MG tablet Take 1,000 mg by mouth in the morning.   atorvastatin (LIPITOR) 10 MG tablet Take 10 mg by mouth at bedtime.   carvedilol (COREG) 6.25 MG tablet Take 6.25 mg by mouth 2 (two) times daily with a meal. Patient will take 1 tablet (6.25 mg) by mouth 3 times daily if in afib   cholecalciferol (VITAMIN D) 25 MCG (1000 UNIT) tablet Take 1,000 Units by mouth in the morning.   COLLAGEN PO Take 1 capsule by mouth daily. Unknown strength   fluticasone (FLONASE) 50 MCG/ACT nasal spray Place 1 spray into both nostrils daily as needed for allergies or rhinitis.   furosemide (LASIX) 20 MG tablet Take 20 mg by mouth daily as needed for edema (weight gain of 2 lbs or more x 2 days).   glipiZIDE  (GLUCOTROL XL) 10 MG 24 hr tablet Take 10 mg by mouth in the morning and at bedtime.    JARDIANCE 10 MG TABS tablet Take 10 mg by mouth every morning.   loratadine (CLARITIN) 10 MG tablet Take 10 mg by mouth in the morning.   montelukast (SINGULAIR) 10 MG tablet Take 10 mg by mouth at bedtime.   omeprazole (PRILOSEC) 20 MG capsule Take 20 mg by mouth in the morning.   sertraline (ZOLOFT) 50 MG tablet Take 50 mg by mouth in the morning.   sodium bicarbonate 650 MG tablet Take 650 mg by mouth 2 (two) times daily.   solifenacin (VESICARE) 5 MG tablet Take 5 mg by mouth daily.   VITAMIN E PO Take 1 capsule by mouth daily. Unknown strength   Zinc 50 MG TABS Take 50 mg by mouth in the morning.     Allergies:   Pneumococcal vaccines, Cleocin [clindamycin hcl], Morphine and related, Other, Shellfish-derived products, Ativan [lorazepam], and Buprenorphine hcl   Social History   Socioeconomic History   Marital status: Married    Spouse name: Not on file   Number of children: Not on file   Years of education: Not on file   Highest education level: Not on file  Occupational History   Not on file  Tobacco Use   Smoking status: Never   Smokeless tobacco: Never   Tobacco comments:    Mother & Father smoked  Vaping Use   Vaping Use: Never used  Substance and Sexual Activity   Alcohol use: No    Alcohol/week: 0.0 standard drinks   Drug use: No   Sexual activity: Yes  Other Topics Concern   Not on file  Social History Narrative   [facility] (11/15/16):   Patient's originally from [place]. Has always lived in [place]. Currently has an outside dog. Remote exposure to [place] when her children were young. No known mold exposure. 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    Social Determinants of Health  Financial Resource Strain: Not on file  Food Insecurity: Not on file  Transportation Needs: Not on file  Physical Activity: Not  on file  Stress: Not on file  Social Connections: Not on file     Family History: The patient's family history includes Breast cancer in her mother; COPD in her mother; Environmental Allergies in her son and another family member; Heart disease in her mother; Kidney cancer in her father; Liver cancer in her brother; Lupus in an other family member. ROS:   Please see the history of present illness.    All 14 point review of systems negative except as described per history of present illness  EKGs/Labs/Other Studies Reviewed:      Recent Labs: 09/01/2020: Pro B Natriuretic peptide (BNP) 1,085.0 12/07/2020: Magnesium 1.9 01/18/2021: ALT 17; BUN 26; Creatinine 1.31; Hemoglobin 12.2; Platelet Count 242; Potassium 3.8; Sodium 136  Recent Lipid Panel    Component Value Date/Time   TRIG 155 (H) 05/08/2013 2023    Physical Exam:    VS:  BP 122/74 (BP Location: Left Arm, Patient Position: Sitting)   Pulse 87   Ht 4\' 11"  (1.499 m)   Wt 152 lb 6.4 oz (69.1 kg)   SpO2 94%   BMI 30.78 kg/m     Wt Readings from Last 3 Encounters:  03/01/21 152 lb (68.9 kg)  03/01/21 152 lb 6.4 oz (69.1 kg)  01/18/21 158 lb (71.7 kg)     GEN:  Well nourished, well developed in no acute distress HEENT: Normal NECK: No JVD; No carotid bruits LYMPHATICS: No lymphadenopathy CARDIAC: RRR, no murmurs, no rubs, no gallops RESPIRATORY:  Clear to auscultation without rales, wheezing or rhonchi  ABDOMEN: Soft, non-tender, non-distended MUSCULOSKELETAL:  No edema; No deformity  SKIN: Warm and dry LOWER EXTREMITIES: no swelling NEUROLOGIC:  Alert and oriented x 3 PSYCHIATRIC:  Normal affect   ASSESSMENT:    1. PAF (paroxysmal atrial fibrillation) ([facility])   2. Primary hypertension   3. Dilated cardiomyopathy ([name])    PLAN:    In order of problems listed above:  Paroxysmal atrial fibrillation EKG showed normal sinus rhythm she is anticoagulant which I will continue, she is maintaining sinus rhythm she is  on amiodarone I did talk to my EP colleague and he decided continue amiodarone for another 3 months or so. Essential hypertension blood pressure appears to be slightly elevated especially at evening time I will give her small dose of amlodipine only 2.5 mg daily History of cardiomyopathy with ejection fraction 4045%, she also has moderate mitral regurgitation.  The hope is that after cardioversion that improved.  We will schedule her to have echocardiogram within the next 3 months.    Medication Adjustments/Labs and Tests Ordered: Current medicines are reviewed at length with the patient today.  Concerns regarding medicines are outlined above.  No orders of the defined types were placed in this encounter.  Medication changes: No orders of the defined types were placed in this encounter.   Signed, Park Liter, MD, [facility] 03/01/2021 11:55 AM    [place]

## 2021-03-01 NOTE — Patient Instructions (Signed)
Medication Instructions:   Your physician recommends that you continue on your current medications as directed. Please refer to the Current Medication list given to you today.  *If you need a refill on your cardiac medications before your next appointment, please call your pharmacy*   Lab Work: [place]   If you have labs (blood work) drawn today and your tests are completely normal, you will receive your results only by: [place] (if you have MyChart) OR A paper copy in the mail If you have any lab test that is abnormal or we need to change your treatment, we will call you to review the results.   Testing/Procedures:  NONE ORDERED  TODAY   Follow-Up: At [facility], you and your health needs are our priority.  As part of our continuing mission to provide you with exceptional heart care, we have created designated Provider Care Teams.  These Care Teams include your primary Cardiologist (physician) and Advanced Practice Providers (APPs -  Physician Assistants and Nurse Practitioners) who all work together to provide you with the care you need, when you need it.  We recommend signing up for the patient portal called "MyChart".  Sign up information is provided on this After Visit Summary.  MyChart is used to connect with patients for Virtual Visits (Telemedicine).  Patients are able to view lab/test results, encounter notes, upcoming appointments, etc.  Non-urgent messages can be sent to your provider as well.   To learn more about what you can do with MyChart, go to [email or web address].    Your next appointment:   3 month(s)  The format for your next appointment:   In Person  Provider:   Allegra Lai, MD   Other Instructions

## 2021-03-01 NOTE — Progress Notes (Signed)
Ekg

## 2021-03-03 ENCOUNTER — Other Ambulatory Visit: Payer: Self-pay | Admitting: Cardiology

## 2021-03-22 ENCOUNTER — Ambulatory Visit: Payer: Medicare Other | Admitting: Cardiology

## 2021-03-29 ENCOUNTER — Other Ambulatory Visit: Payer: Self-pay

## 2021-03-29 MED ORDER — APIXABAN 5 MG PO TABS: 5.0000 mg | ORAL_TABLET | Freq: Two times a day (BID) | ORAL | 1 refills | Status: DC

## 2021-03-29 NOTE — Telephone Encounter (Signed)
Pt last saw Dr Curt Bears 03/01/21, last labs 01/18/21 Creat 1.31, age 65, weight 68.9kg, based on specified criteria pt is on appropriate dosage of Eliquis 5mg  BID.  Will refill rx.

## 2021-03-31 ENCOUNTER — Other Ambulatory Visit: Payer: Self-pay | Admitting: Cardiology

## 2021-03-31 ENCOUNTER — Inpatient Hospital Stay: Payer: Medicare Other

## 2021-03-31 ENCOUNTER — Inpatient Hospital Stay (HOSPITAL_BASED_OUTPATIENT_CLINIC_OR_DEPARTMENT_OTHER): Payer: Medicare Other | Admitting: Family

## 2021-03-31 ENCOUNTER — Inpatient Hospital Stay: Payer: Medicare Other | Attending: Family

## 2021-03-31 ENCOUNTER — Encounter: Payer: Self-pay | Admitting: Family

## 2021-03-31 ENCOUNTER — Other Ambulatory Visit: Payer: Self-pay

## 2021-03-31 VITALS — BP 121/70 | HR 85 | Temp 98.8°F | Resp 17 | Wt 150.8 lb

## 2021-03-31 DIAGNOSIS — Z853 Personal history of malignant neoplasm of breast: Secondary | ICD-10-CM | POA: Insufficient documentation

## 2021-03-31 DIAGNOSIS — D509 Iron deficiency anemia, unspecified: Secondary | ICD-10-CM | POA: Insufficient documentation

## 2021-03-31 DIAGNOSIS — K909 Intestinal malabsorption, unspecified: Secondary | ICD-10-CM | POA: Insufficient documentation

## 2021-03-31 DIAGNOSIS — Z885 Allergy status to narcotic agent status: Secondary | ICD-10-CM | POA: Diagnosis not present

## 2021-03-31 DIAGNOSIS — N183 Chronic kidney disease, stage 3 unspecified: Secondary | ICD-10-CM

## 2021-03-31 DIAGNOSIS — D508 Other iron deficiency anemias: Secondary | ICD-10-CM

## 2021-03-31 DIAGNOSIS — D631 Anemia in chronic kidney disease: Secondary | ICD-10-CM

## 2021-03-31 DIAGNOSIS — Z881 Allergy status to other antibiotic agents status: Secondary | ICD-10-CM | POA: Diagnosis not present

## 2021-03-31 LAB — CBC WITH DIFFERENTIAL (CANCER CENTER ONLY)
Abs Immature Granulocytes: 0.02 10*3/uL (ref 0.00–0.07)
Basophils Absolute: 0.1 10*3/uL (ref 0.0–0.1)
Basophils Relative: 1 %
Eosinophils Absolute: 0.3 10*3/uL (ref 0.0–0.5)
Eosinophils Relative: 4 %
HCT: 38.9 % (ref 36.0–46.0)
Hemoglobin: 12.9 g/dL (ref 12.0–15.0)
Immature Granulocytes: 0 %
Lymphocytes Relative: 22 %
Lymphs Abs: 1.7 10*3/uL (ref 0.7–4.0)
MCH: 30.2 pg (ref 26.0–34.0)
MCHC: 33.2 g/dL (ref 30.0–36.0)
MCV: 91.1 fL (ref 80.0–100.0)
Monocytes Absolute: 0.5 10*3/uL (ref 0.1–1.0)
Monocytes Relative: 6 %
Neutro Abs: 5.3 10*3/uL (ref 1.7–7.7)
Neutrophils Relative %: 67 %
Platelet Count: 206 10*3/uL (ref 150–400)
RBC: 4.27 MIL/uL (ref 3.87–5.11)
RDW: 15.9 % — ABNORMAL HIGH (ref 11.5–15.5)
WBC Count: 7.9 10*3/uL (ref 4.0–10.5)
nRBC: 0.3 % — ABNORMAL HIGH (ref 0.0–0.2)

## 2021-03-31 LAB — CMP (CANCER CENTER ONLY)
ALT: 20 U/L (ref 0–44)
AST: 17 U/L (ref 15–41)
Albumin: 4.5 g/dL (ref 3.5–5.0)
Alkaline Phosphatase: 78 U/L (ref 38–126)
Anion gap: 13 (ref 5–15)
BUN: 37 mg/dL — ABNORMAL HIGH (ref 8–23)
CO2: 25 mmol/L (ref 22–32)
Calcium: 9.7 mg/dL (ref 8.9–10.3)
Chloride: 98 mmol/L (ref 98–111)
Creatinine: 1.55 mg/dL — ABNORMAL HIGH (ref 0.44–1.00)
GFR, Estimated: 37 mL/min — ABNORMAL LOW (ref >60)
Glucose, Bld: 311 mg/dL — ABNORMAL HIGH (ref 70–99)
Potassium: 4.3 mmol/L (ref 3.5–5.1)
Sodium: 136 mmol/L (ref 135–145)
Total Bilirubin: 0.5 mg/dL (ref 0.3–1.2)
Total Protein: 8 g/dL (ref 6.5–8.1)

## 2021-03-31 LAB — RETICULOCYTES
Immature Retic Fract: 14.2 % (ref 2.3–15.9)
RBC.: 4.19 MIL/uL (ref 3.87–5.11)
Retic Count, Absolute: 113.5 10*3/uL (ref 19.0–186.0)
Retic Ct Pct: 2.7 % (ref 0.4–3.1)

## 2021-03-31 NOTE — Progress Notes (Signed)
Hematology and Oncology Follow Up Visit  [name] [id number] 10/14/1955 65 y.o. 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 She is doing well and has been staying busy taking care of her husband who has had surgery and is in PT. She has mild fatigue at times and will rest as needed.  She has not noted any blood loss. No bruising or petechiae.  Hgb is 12.9, MCV 91, platelets 206 and WBC count 7.9.  She denies fever, chills, n/v, cough, rash, dizziness, chest pain, palpitations, abdominal pain or changes in bowel or bladder habits.  No swelling, numbness or tingling in her extremities at this time. Pedal pulses are 2+.  No falls or syncope to report.  She is eating well and staying properly hydrated. Her weight is stable at 150 lbs.   ECOG Performance Status: 1 - Symptomatic but completely ambulatory  Medications:  Allergies as of 03/31/2021       Reactions   Pneumococcal Vaccines Other (See Comments)   Really high fever, and flu symptoms. MD instructed not to give   Cleocin [clindamycin Hcl] Other (See Comments)   "irritated my esophagus"   Morphine And Related Nausea And Vomiting   Other Nausea And Vomiting, Swelling   Shrimp if eat a lot   Shellfish-derived Products Other (See Comments)   Patient stated she is allergic to "shrimp," if she eats "a lot"   Ativan [lorazepam] Other (See Comments)   Difficulty waking   Buprenorphine Hcl Nausea And Vomiting   Pt does not know if she has ever had this medication        Medication List        Accurate as of March 31, 2021  2:31 PM. If you have any questions, ask your nurse or doctor.          acetaminophen 500 MG  tablet Commonly known as: TYLENOL Take 500 mg by mouth every 6 (six) hours as needed for mild pain, headache or fever.   albuterol 108 (90 Base) MCG/ACT inhaler Commonly known as: VENTOLIN HFA Inhale 2 puffs into the lungs every 6 (six) hours as needed for wheezing or shortness of breath.   amiodarone 200 MG tablet Commonly known as: PACERONE Take 1 tablet (200 mg total) by mouth daily. Take 400 mg (2 tablets) twice daily for 7 days, then reduce to 200 mg (1 tablet) daily thereafter.   amLODipine 2.5 MG tablet Commonly known as: NORVASC TAKE 1 TABLET BY MOUTH EVERY DAY   apixaban 5 MG Tabs tablet Commonly known as: Eliquis Take 1 tablet (5 mg total) by mouth 2 (two) times daily.   atorvastatin 10 MG tablet Commonly known as: LIPITOR Take 10 mg by mouth at bedtime.   carvedilol 6.25 MG tablet Commonly known as: COREG Take 6.25 mg by mouth 2 (two) times daily with a meal. Patient will take 1 tablet (6.25 mg) by mouth 3 times daily if in afib   cholecalciferol 25 MCG (1000 UNIT) tablet Commonly known as: VITAMIN D Take 1,000 Units by mouth in the morning.   COLLAGEN PO Take 1 capsule by mouth daily. Unknown strength   fluticasone 50 MCG/ACT nasal  spray Commonly known as: FLONASE Place 1 spray into both nostrils daily as needed for allergies or rhinitis.   furosemide 20 MG tablet Commonly known as: LASIX Take 20 mg by mouth daily as needed for edema (weight gain of 2 lbs or more x 2 days).   glipiZIDE 10 MG 24 hr tablet Commonly known as: GLUCOTROL XL Take 10 mg by mouth in the morning and at bedtime.   Jardiance 10 MG Tabs tablet Generic drug: empagliflozin Take 10 mg by mouth every morning.   loratadine 10 MG tablet Commonly known as: CLARITIN Take 10 mg by mouth in the morning.   montelukast 10 MG tablet Commonly known as: SINGULAIR Take 10 mg by mouth at bedtime.   omeprazole 20 MG capsule Commonly known as: PRILOSEC Take 20 mg by mouth in the morning.    sertraline 50 MG tablet Commonly known as: ZOLOFT Take 50 mg by mouth in the morning.   sodium bicarbonate 650 MG tablet Take 650 mg by mouth 2 (two) times daily.   solifenacin 5 MG tablet Commonly known as: VESICARE Take 5 mg by mouth daily.   vitamin C 1000 MG tablet Take 1,000 mg by mouth in the morning.   VITAMIN E PO Take 1 capsule by mouth daily. Unknown strength   Zinc 50 MG Tabs Take 50 mg by mouth in the morning.        Allergies:  Allergies  Allergen Reactions   Pneumococcal Vaccines Other (See Comments)    Really high fever, and flu symptoms. MD instructed not to give   Cleocin [Clindamycin Hcl] Other (See Comments)    "irritated my esophagus"   Morphine And Related Nausea And Vomiting   Other Nausea And Vomiting and Swelling    Shrimp if eat a lot   Shellfish-Derived Products Other (See Comments)    Patient stated she is allergic to "shrimp," if she eats "a lot"   Ativan [Lorazepam] Other (See Comments)    Difficulty waking   Buprenorphine Hcl Nausea And Vomiting    Pt does not know if she has ever had this medication    Past Medical History, Surgical history, Social history, and Family History were reviewed and updated.  Review of Systems: All other 10 point review of systems is negative.   Physical Exam:  weight is 150 lb 12.8 oz (68.4 kg). Her oral temperature is 98.8 F (37.1 C). Her blood pressure is 121/70 and her pulse is 85. Her respiration is 17 and oxygen saturation is 98%.   Wt Readings from Last 3 Encounters:  03/31/21 150 lb 12.8 oz (68.4 kg)  03/01/21 152 lb (68.9 kg)  03/01/21 152 lb 6.4 oz (69.1 kg)    Ocular: Sclerae unicteric, pupils equal, round and reactive to light Ear-nose-throat: Oropharynx clear, dentition fair Lymphatic: No cervical or supraclavicular adenopathy Lungs no rales or rhonchi, good excursion bilaterally Heart regular rate and rhythm, no murmur appreciated Abd soft, nontender, positive bowel sounds MSK no  focal spinal tenderness, no joint edema Neuro: non-focal, well-oriented, appropriate affect Breasts: Deferred   Lab Results  Component Value Date   WBC 7.9 03/31/2021   HGB 12.9 03/31/2021   HCT 38.9 03/31/2021   MCV 91.1 03/31/2021   PLT 206 03/31/2021   Lab Results  Component Value Date   FERRITIN 434 (H) 01/18/2021   IRON 61 01/18/2021   TIBC 251 01/18/2021   UIBC 191 01/18/2021   IRONPCTSAT 24 01/18/2021   Lab Results  Component Value Date  RETICCTPCT 2.7 03/31/2021   RBC 4.27 03/31/2021   RBC 4.19 03/31/2021   No results found for: Nils Pyle [facility] Lab Results  Component Value Date   IGGSERUM 789 11/15/2016   IGA 150 11/15/2016   IGMSERUM 86 11/15/2016   No results found for: Odetta Pink, SPEI   Chemistry      Component Value Date/Time   NA 136 01/18/2021 1421   NA 139 11/02/2020 1136   NA 144 04/17/2017 1150   NA 141 04/18/2016 1256   K 3.8 01/18/2021 1421   K 4.6 04/17/2017 1150   K 4.4 04/18/2016 1256   CL 101 01/18/2021 1421   CL 109 (H) 04/17/2017 1150   CO2 23 01/18/2021 1421   CO2 23 04/17/2017 1150   CO2 18 (L) 04/18/2016 1256   BUN 26 (H) 01/18/2021 1421   BUN 33 (H) 11/02/2020 1136   BUN 28 (H) 04/17/2017 1150   BUN 27.3 (H) 04/18/2016 1256   CREATININE 1.31 (H) 01/18/2021 1421   CREATININE 1.3 (H) 04/17/2017 1150   CREATININE 1.4 (H) 04/18/2016 1256      Component Value Date/Time   CALCIUM 9.3 01/18/2021 1421   CALCIUM 9.6 04/17/2017 1150   CALCIUM 9.4 04/18/2016 1256   ALKPHOS 79 01/18/2021 1421   ALKPHOS 62 04/17/2017 1150   ALKPHOS 68 04/18/2016 1256   AST 16 01/18/2021 1421   AST 14 04/18/2016 1256   ALT 17 01/18/2021 1421   ALT 20 04/17/2017 1150   ALT 23 04/18/2016 1256   BILITOT 0.3 01/18/2021 1421   BILITOT <0.22 04/18/2016 1256       Impression and Plan: [name] is a very pleasant 65 yo caucasian female with remote history of 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  She continues to do well and so far there has been no evidence of recurrence.  Iron studies are pending. We will replace if needed.  No ESA needed, Hgb 12.9.  Follow-up in 3 months.  She can contact our office with any questions or concerns.   Lottie Dawson, NP 9/28/20222:31 PM

## 2021-04-01 ENCOUNTER — Telehealth: Payer: Self-pay | Admitting: *Deleted

## 2021-04-01 LAB — IRON AND TIBC
Iron: 65 ug/dL (ref 41–142)
Saturation Ratios: 23 % (ref 21–57)
TIBC: 288 ug/dL (ref 236–444)
UIBC: 222 ug/dL (ref 120–384)

## 2021-04-01 LAB — FERRITIN: Ferritin: 497 ng/mL — ABNORMAL HIGH (ref 11–307)

## 2021-04-01 NOTE — Telephone Encounter (Signed)
Per 03/31/21 los - called and lvm of upcoming appointments - requested callback to confirm - mailed calendar

## 2021-04-20 ENCOUNTER — Other Ambulatory Visit: Payer: Self-pay | Admitting: Hematology & Oncology

## 2021-04-20 DIAGNOSIS — Z1231 Encounter for screening mammogram for malignant neoplasm of breast: Secondary | ICD-10-CM

## 2021-04-22 DIAGNOSIS — M2669 Other specified disorders of temporomandibular joint: Secondary | ICD-10-CM

## 2021-04-22 DIAGNOSIS — H9202 Otalgia, left ear: Secondary | ICD-10-CM | POA: Insufficient documentation

## 2021-04-22 HISTORY — DX: Other specified disorders of temporomandibular joint: M26.69

## 2021-04-22 HISTORY — DX: Otalgia, left ear: H92.02

## 2021-05-21 ENCOUNTER — Ambulatory Visit
Admission: RE | Admit: 2021-05-21 | Discharge: 2021-05-21 | Disposition: A | Discharge status: Home / Self Care | Payer: Medicare Other | Source: Ambulatory Visit | Attending: Hematology & Oncology | Admitting: Hematology & Oncology

## 2021-05-21 ENCOUNTER — Other Ambulatory Visit: Payer: Self-pay

## 2021-05-21 DIAGNOSIS — Z1231 Encounter for screening mammogram for malignant neoplasm of breast: Secondary | ICD-10-CM

## 2021-06-01 ENCOUNTER — Other Ambulatory Visit: Payer: Medicare Other

## 2021-06-08 ENCOUNTER — Other Ambulatory Visit: Payer: Self-pay

## 2021-06-08 ENCOUNTER — Ambulatory Visit (INDEPENDENT_AMBULATORY_CARE_PROVIDER_SITE_OTHER): Payer: Medicare Other

## 2021-06-08 DIAGNOSIS — I420 Dilated cardiomyopathy: Secondary | ICD-10-CM | POA: Diagnosis not present

## 2021-06-08 DIAGNOSIS — I480 Paroxysmal atrial fibrillation: Secondary | ICD-10-CM | POA: Diagnosis not present

## 2021-06-08 DIAGNOSIS — I10 Essential (primary) hypertension: Secondary | ICD-10-CM

## 2021-06-08 LAB — ECHOCARDIOGRAM COMPLETE
Area-P 1/2: 2.84 cm2
S' Lateral: 2.7 cm

## 2021-06-09 ENCOUNTER — Ambulatory Visit (INDEPENDENT_AMBULATORY_CARE_PROVIDER_SITE_OTHER): Payer: Medicare Other | Admitting: Cardiology

## 2021-06-09 ENCOUNTER — Encounter: Payer: Self-pay | Admitting: Cardiology

## 2021-06-09 VITALS — BP 134/68 | HR 77 | Ht 59.0 in | Wt 157.2 lb

## 2021-06-09 DIAGNOSIS — I480 Paroxysmal atrial fibrillation: Secondary | ICD-10-CM

## 2021-06-09 NOTE — Progress Notes (Signed)
Electrophysiology Office Note   Date:  06/09/2021   ID:  Avo, Schlachter 28-Feb-1956, MRN [id number]  PCP:  Enid Skeens., MD  Cardiologist:  Agustin Cree Primary Electrophysiologist:  Herman Fiero [name], MD    Chief Complaint: AF   History of Present Illness: [name] is a 65 y.o. female who is being seen today for the evaluation of AF at the request of [name], [name]., MD. Presenting today for electrophysiology evaluation.  She has a history significant for atrial fibrillation, cardiomyopathy with normalized ejection fraction, diastolic dysfunction, mitral valve prolapse, mild to moderate mitral regurgitation, diabetes, hyperlipidemia, breast cancer.  She is status post atrial fibrillation ablation 11/25/2020.  Unfortunately ablation was complicated by retroperitoneal bleed which was treated conservatively.  She did go back into atrial fibrillation in the setting of retroperitoneal bleeding and was started on amiodarone.  Today, denies symptoms of palpitations, chest pain, shortness of breath, orthopnea, PND, lower extremity edema, claudication, dizziness, presyncope, syncope, bleeding, or neurologic sequela. The patient is tolerating medications without difficulties.  Since being seen she has done well.  She is noted no further episodes of atrial fibrillation.  She is overall happy with her control.  She is able to do all of her daily activities without restriction.   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 difficile colitis    Calculus of kidney 08/12/2012   CHF (congestive heart failure) ([place])    "mild" (05/15/2013)   CHF (congestive heart failure) ([place])    "mild" (05/15/2013)   Chronic bronchitis (HCC)    Chronic lower back pain    Chronic non-seasonal allergic rhinitis 90/24/0973   Chronic systolic congestive heart failure (HCC)    Chronic UTI    Cough variant asthma vs uacs  07/24/2017   Onset in her 20s some better p rx with allergy shots in her 30s Singulair trial 11/15/16 >  Improved 01/18/17:  FEV1 1.29 L (61%)  Ratio 75  Still on ACEi as of 07/24/2017  Spirometry 06/28/18   FEV1 1.3 (61%)  Ratio 77 with min curvature   - Allergy profile 07/26/2018 >  Eos 1.2 /  IgE  420  RAST Pos mold only    Cough variant asthma vs uacs  07/24/2017   Onset in her 20s some better p rx with allergy shots in her 30s Singulair trial 11/15/16 >  Improved 01/18/17:  FEV1 1.29 L (61%)  Ratio 75  Still on ACEi as of 07/24/2017  Spirometry 06/28/18   FEV1 1.3 (61%)  Ratio 77 with min curvature   - Allergy profile 07/26/2018 >  Eos 1.2 /  IgE  420  RAST Pos mold only    Diabetes mellitus type 2 in obese ([place])    Diarrhea 11/15/2016   Dilated cardiomyopathy ([place]) 07/16/2019   DOE (dyspnea on exertion) 07/27/2018   Onset early 2019 assoc with uacs while on ACEi - 06/28/18    Walked RA x one lap =  approx 250 ft - stopped  due to sob with sats still high 90's      Dysphagia    Dysrhythmia    atrial fib/dr [facility] [place] [facility]   Family history of anesthesia complication    "my mother also had PONV" (05/15/2013)   Fever 10/29/2017   Foot pain 04/09/2018   GERD (gastroesophageal reflux disease)    Heart murmur    High cholesterol    History of breast cancer in female 07/19/2013   HTN (hypertension) 05/08/2013   Hydronephrosis of right kidney    Hyperlipidemia 11/15/2016   Hypomagnesemia    Hyponatremia 10/29/2017   IDA (iron deficiency anemia) 04/19/2017   Infection    Kidney stones    Left breast  mass 07/19/2013   Low back pain 05/01/2018   Migraine    "haven't had one in the early 2000's" (05/15/2013)   Mitral regurgitation 09/03/2020   Mixed incontinence 07/24/2018   Nephrolithiasis 11/15/2016   Other chronic cystitis 05/19/2011   PAF (paroxysmal atrial fibrillation) ([place])    Personal history of chemotherapy    Personal history of radiation therapy    Pneumonia    "used to be chronic; last time I had it was 2013" (05/15/2013)   PONV (postoperative nausea and vomiting)    Recurrent sinusitis 09/08/2016   Recurrent UTI 11/20/2014   Recurrent UTI (urinary tract infection)    "from the continuous kidney stones; take Macrodantin qd" (05/15/2013)   Referred otalgia of left ear 04/22/2021   Respiratory failure requiring intubation ([place]) 05/08/2013   Restrictive lung disease 01/18/2017   Retroperitoneal bleed 12/05/2020   Sacroiliac joint pain 05/16/2018   Sepsis ([place]) 2005   kidney stone infection   Septic shock ([place]) 09/23/2018   Simple chronic bronchitis ([place]) 11/15/2016   Strep throat 10/29/2017   Tachypnea    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   Tension headache    Type II diabetes mellitus ([place])    Type II or unspecified type diabetes mellitus without mention of complication, not stated as uncontrolled 05/11/2013   Urinary tract stones 05/19/2011   Urinary urgency 07/24/2018   Past Surgical History:  Procedure Laterality Date   ATRIAL FIBRILLATION ABLATION N/A 11/25/2020   Procedure: ATRIAL FIBRILLATION ABLATION;  Surgeon: Constance Haw, MD;  Location: [facility];  Service: Cardiovascular;  Laterality: N/A;   BILATERAL OOPHORECTOMY Bilateral 2006   "cause I needed to 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" (05/15/2013)   BREAST BIOPSY Left 02/2001   BREAST LUMPECTOMY Left 02/2001   BREAST LUMPECTOMY Left 09/23/2013   Procedure: LEFT LUMPECTOMY WITH SPECIMEN MAMMOGRAM;  Surgeon: Adin Hector, MD;  Location: [facility];  Service: General;  Laterality: Left;   BUBBLE STUDY  11/24/2020   Procedure: BUBBLE STUDY;  Surgeon: Geralynn Rile, MD;  Location: [facility];  Service: Cardiovascular;;   COLONOSCOPY     DIAGNOSTIC LAPAROSCOPY     cyst-near ovary   LITHOTRIPSY     "2-3 times prior to 2002" (05/15/2013)   SPINAL FUSION N/A 05/08/2013   Procedure: T9-S1 INSTRUMENTED FUSION T12 -S1 DECOMPRESSION;  Surgeon: Melina Schools, MD;  Location: [facility];  Service: Orthopedics;  Laterality: N/A;   TEE WITHOUT CARDIOVERSION N/A 11/06/2020   Procedure: TRANSESOPHAGEAL ECHOCARDIOGRAM (TEE);  Surgeon: Fay Records, MD;  Location: [facility];  Service: Cardiovascular;  Laterality: N/A;   TEE WITHOUT CARDIOVERSION N/A 11/24/2020   Procedure: TRANSESOPHAGEAL ECHOCARDIOGRAM (TEE);  Surgeon: Geralynn Rile, MD;  Location: [facility];  Service: Cardiovascular;  Laterality: N/A;   TUBAL LIGATION Bilateral 1985     Current Outpatient Medications  Medication Sig Dispense Refill   acetaminophen (TYLENOL) 500 MG tablet Take 500 mg by mouth every 6 (six) hours as needed for mild pain, headache or fever.     albuterol (VENTOLIN HFA) 108 (90 Base) MCG/ACT inhaler Inhale 2 puffs into the lungs every 6 (six) hours as needed for wheezing or shortness of breath.     Alogliptin Benzoate 12.5 MG TABS Take 1 tablet by mouth daily.     amLODipine (NORVASC) 2.5 MG tablet TAKE 1 TABLET BY MOUTH EVERY DAY 90 tablet 2   ammonium lactate (LAC-HYDRIN) 12 % lotion Apply 1 application topically daily.     apixaban (ELIQUIS) 5 MG TABS tablet Take 1 tablet (5 mg total) by mouth 2 (two) times daily. 180 tablet 1   Ascorbic Acid (VITAMIN C) 1000 MG tablet Take 1,000 mg by mouth in the morning.     atorvastatin (LIPITOR) 10 MG tablet Take 10 mg by mouth at bedtime.     carvedilol (COREG) 6.25 MG tablet Take 6.25 mg by mouth 2 (two) times daily with a meal. Patient [name] take 1 tablet (6.25 mg) by mouth 3 times daily if in afib      cholecalciferol (VITAMIN D) 25 MCG (1000 UNIT) tablet Take 1,000 Units by mouth in the morning.     fluticasone (FLONASE) 50 MCG/ACT nasal spray Place 1 spray into both nostrils daily as needed for allergies or rhinitis.     furosemide (LASIX) 20 MG tablet Take 20 mg by mouth daily as needed for edema (weight gain of 2 lbs or more x 2 days).     glipiZIDE (GLUCOTROL XL) 10 MG 24 hr tablet Take 10 mg by mouth in the morning and at bedtime.      JANUVIA 100 MG tablet Take 100 mg by mouth daily.     loratadine (CLARITIN) 10 MG tablet Take 10 mg by mouth in the morning.     montelukast (SINGULAIR) 10 MG tablet Take 10 mg by mouth at bedtime.     omeprazole (PRILOSEC) 20 MG capsule Take 20 mg by mouth in the morning.     sertraline (ZOLOFT) 50 MG tablet Take 50 mg by mouth in the morning.     sodium bicarbonate 650 MG tablet Take 650 mg by mouth 2 (two) times daily.     solifenacin (VESICARE) 5 MG tablet Take 5 mg by mouth daily.     VITAMIN E PO Take 1 capsule by mouth daily. Unknown strength     Zinc 50 MG TABS Take 50 mg by mouth in the morning.     No current facility-administered medications for this visit.    Allergies:   Pneumococcal vaccines, Cleocin [clindamycin hcl], Morphine and related, Other, Shellfish-derived products, Ativan [lorazepam], and Buprenorphine hcl   Social History:  The patient  reports that she has never smoked. She has never used smokeless tobacco. She reports that she does not drink alcohol and does not use drugs.   Family History:  The patient's family history includes Breast cancer in her mother; COPD in her mother; Environmental Allergies in her son and another family member; Heart disease in her mother; Kidney cancer in her father; Liver cancer in her brother; Lupus in an other family member.   ROS:  Please see the history of present illness.   Otherwise, review of systems is positive for none.   All other systems are reviewed  and negative.   PHYSICAL EXAM: VS:   BP 134/68   Pulse 77   Ht 4\' 11"  (1.499 m)   Wt 157 lb 3.2 oz (71.3 kg)   SpO2 97%   BMI 31.75 kg/m  , BMI Body mass index is 31.75 kg/m. GEN: Well nourished, well developed, in no acute distress  HEENT: normal  Neck: no JVD, carotid bruits, or masses Cardiac: RRR; no murmurs, rubs, or gallops,no edema  Respiratory:  clear to auscultation bilaterally, normal work of breathing GI: soft, nontender, nondistended, + BS MS: no deformity or atrophy  Skin: warm and dry Neuro:  Strength and sensation are intact Psych: euthymic mood, full affect  EKG:  EKG is ordered today. Personal review of the ekg ordered shows sinus rhythm  Recent Labs: 09/01/2020: Pro B Natriuretic peptide (BNP) 1,085.0 12/07/2020: Magnesium 1.9 03/31/2021: ALT 20; BUN 37; Creatinine 1.55; Hemoglobin 12.9; Platelet Count 206; Potassium 4.3; Sodium 136    Lipid Panel     Component Value Date/Time   TRIG 155 (H) 05/08/2013 2023     Wt Readings from Last 3 Encounters:  06/09/21 157 lb 3.2 oz (71.3 kg)  03/31/21 150 lb 12.8 oz (68.4 kg)  03/01/21 152 lb (68.9 kg)      Other studies Reviewed: Additional studies/ records that were reviewed today include: TTE 06/08/21 Review of the above records today demonstrates:   1. Left ventricular ejection fraction, by estimation, is 50 to 55%. The  left ventricle has low normal function. The left ventricle has no regional  wall motion abnormalities. There is moderate left ventricular hypertrophy.  Left ventricular diastolic  parameters are consistent with Grade II diastolic dysfunction  (pseudonormalization).   2. Right ventricular systolic function is normal. The right ventricular  size is normal. There is normal pulmonary artery systolic pressure.   3. The mitral valve is normal in structure. Mild to moderate mitral valve  regurgitation. No evidence of mitral stenosis.   4. The aortic valve is normal in structure. Aortic valve regurgitation is  not visualized. No  aortic stenosis is present.   5. The inferior vena cava is normal in size with greater than 50%  respiratory variability, suggesting right atrial pressure of 3 mmHg.    ASSESSMENT AND PLAN:  1.  Paroxysmal atrial fibrillation: Currently on carvedilol, amiodarone, Eliquis.  CHA2DS2-VASc of 4.  Status post ablation 11/25/2020.  High risk medication monitoring for amiodarone.  She has had no more episodes of atrial fibrillation.  Due to that we [name] stop her amiodarone today.  2.  Dilated cardiomyopathy: Fortunately ejection fraction has normalized.  Continues to have intermittent episodes of volume overload.  Plan per primary physician.  3.  Hypertension: Currently well controlled  4.  Retroperitoneal bleed: Occurred post atrial fibrillation ablation.  She is recovering well on not having long-term side effects.   Current medicines are reviewed at length with the patient today.   The patient does not have concerns regarding her medicines.  The following changes were made today: None   Labs/ tests ordered today include:  Orders Placed This Encounter  Procedures   EKG 12-Lead       Disposition:   FU with [name] 6 months  Signed, Mikiyah Glasner [name], MD  06/09/2021 10:23 AM     [address] [place] [address] [phone] (office) [phone] (fax)'

## 2021-06-09 NOTE — Patient Instructions (Signed)
Medication Instructions:  Your physician has recommended you make the following change in your medication:  STOP Amiodarone  *If you need a refill on your cardiac medications before your next appointment, please call your pharmacy*   Lab Work: None ordered   Testing/Procedures: None ordered   Follow-Up: At [facility], you and your health needs are our priority.  As part of our continuing mission to provide you with exceptional heart care, we have created designated Provider Care Teams.  These Care Teams include your primary Cardiologist (physician) and Advanced Practice Providers (APPs -  Physician Assistants and Nurse Practitioners) who all work together to provide you with the care you need, when you need it.  Your next appointment:   6 month(s)  The format for your next appointment:   In Person  Provider:   Will Camnitz, MD    Thank you for choosing [facility]!!   Lusia Greis, RN [phone]      

## 2021-06-14 ENCOUNTER — Ambulatory Visit: Payer: Medicare Other | Admitting: Cardiology

## 2021-06-22 ENCOUNTER — Other Ambulatory Visit: Payer: Self-pay | Admitting: Cardiology

## 2021-06-26 ENCOUNTER — Other Ambulatory Visit: Payer: Self-pay | Admitting: Cardiology

## 2021-06-30 ENCOUNTER — Ambulatory Visit: Payer: Medicare Other

## 2021-06-30 ENCOUNTER — Other Ambulatory Visit: Payer: Medicare Other

## 2021-06-30 ENCOUNTER — Ambulatory Visit: Payer: Medicare Other | Admitting: Hematology & Oncology

## 2021-06-30 ENCOUNTER — Ambulatory Visit: Payer: Medicare Other | Admitting: Family

## 2021-07-13 ENCOUNTER — Other Ambulatory Visit: Payer: Self-pay

## 2021-07-13 ENCOUNTER — Inpatient Hospital Stay: Payer: Medicare Other

## 2021-07-13 ENCOUNTER — Inpatient Hospital Stay: Payer: Medicare Other | Attending: Hematology & Oncology

## 2021-07-13 ENCOUNTER — Inpatient Hospital Stay (HOSPITAL_BASED_OUTPATIENT_CLINIC_OR_DEPARTMENT_OTHER): Payer: Medicare Other | Admitting: Family

## 2021-07-13 ENCOUNTER — Encounter: Payer: Self-pay | Admitting: Family

## 2021-07-13 VITALS — BP 127/62 | HR 73 | Temp 98.6°F | Resp 19 | Ht 59.0 in | Wt 157.1 lb

## 2021-07-13 DIAGNOSIS — D631 Anemia in chronic kidney disease: Secondary | ICD-10-CM | POA: Diagnosis not present

## 2021-07-13 DIAGNOSIS — N183 Chronic kidney disease, stage 3 unspecified: Secondary | ICD-10-CM | POA: Diagnosis not present

## 2021-07-13 DIAGNOSIS — D508 Other iron deficiency anemias: Secondary | ICD-10-CM

## 2021-07-13 DIAGNOSIS — D509 Iron deficiency anemia, unspecified: Secondary | ICD-10-CM | POA: Insufficient documentation

## 2021-07-13 DIAGNOSIS — Z853 Personal history of malignant neoplasm of breast: Secondary | ICD-10-CM | POA: Insufficient documentation

## 2021-07-13 LAB — RETICULOCYTES
Immature Retic Fract: 16.5 % — ABNORMAL HIGH (ref 2.3–15.9)
RBC.: 3.78 MIL/uL — ABNORMAL LOW (ref 3.87–5.11)
Retic Count, Absolute: 101.7 10*3/uL (ref 19.0–186.0)
Retic Ct Pct: 2.7 % (ref 0.4–3.1)

## 2021-07-13 LAB — CMP (CANCER CENTER ONLY)
ALT: 23 U/L (ref 0–44)
AST: 17 U/L (ref 15–41)
Albumin: 4.3 g/dL (ref 3.5–5.0)
Alkaline Phosphatase: 84 U/L (ref 38–126)
Anion gap: 10 (ref 5–15)
BUN: 19 mg/dL (ref 8–23)
CO2: 26 mmol/L (ref 22–32)
Calcium: 9.1 mg/dL (ref 8.9–10.3)
Chloride: 100 mmol/L (ref 98–111)
Creatinine: 1.22 mg/dL — ABNORMAL HIGH (ref 0.44–1.00)
GFR, Estimated: 49 mL/min — ABNORMAL LOW (ref >60)
Glucose, Bld: 351 mg/dL — ABNORMAL HIGH (ref 70–99)
Potassium: 4.2 mmol/L (ref 3.5–5.1)
Sodium: 136 mmol/L (ref 135–145)
Total Bilirubin: 0.4 mg/dL (ref 0.3–1.2)
Total Protein: 7.1 g/dL (ref 6.5–8.1)

## 2021-07-13 LAB — CBC WITH DIFFERENTIAL (CANCER CENTER ONLY)
Abs Immature Granulocytes: 0.04 10*3/uL (ref 0.00–0.07)
Basophils Absolute: 0.1 10*3/uL (ref 0.0–0.1)
Basophils Relative: 1 %
Eosinophils Absolute: 0.1 10*3/uL (ref 0.0–0.5)
Eosinophils Relative: 2 %
HCT: 35.5 % — ABNORMAL LOW (ref 36.0–46.0)
Hemoglobin: 11.9 g/dL — ABNORMAL LOW (ref 12.0–15.0)
Immature Granulocytes: 1 %
Lymphocytes Relative: 20 %
Lymphs Abs: 1.6 10*3/uL (ref 0.7–4.0)
MCH: 31.4 pg (ref 26.0–34.0)
MCHC: 33.5 g/dL (ref 30.0–36.0)
MCV: 93.7 fL (ref 80.0–100.0)
Monocytes Absolute: 0.5 10*3/uL (ref 0.1–1.0)
Monocytes Relative: 6 %
Neutro Abs: 5.5 10*3/uL (ref 1.7–7.7)
Neutrophils Relative %: 70 %
Platelet Count: 206 10*3/uL (ref 150–400)
RBC: 3.79 MIL/uL — ABNORMAL LOW (ref 3.87–5.11)
RDW: 13.5 % (ref 11.5–15.5)
WBC Count: 7.8 10*3/uL (ref 4.0–10.5)
nRBC: 0 % (ref 0.0–0.2)

## 2021-07-13 NOTE — Progress Notes (Signed)
Hematology and Oncology Follow Up Visit  [name] [id number] [birth year] 66 y.o. 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 Hgb is stable at 11.9.  She has not noted any blood loss. No abnormal bruising, no petechiae.  She is having right hip pain with sciatica in the right leg. She has an appointment with her chiropractor.  She is ambulating with a cane for added support.  She has not been sleeping well and notes some fatigue.  No fever, chills, n/v, cough, rash, dizziness, SOB, chest pain, palpitations, abdominal pain or changes in bowel or bladder habits.  The numbness or tingling in her extremities seems to be better.  She has some swelling and tenderness in her hands which she feels is probably arthritis and plans to discuss with her PCP.   No falls or syncope to report.  She has a good appetite and is staying hydrated throughout the day. Her weight is 157 lbs.   ECOG Performance Status: 1 - Symptomatic but completely ambulatory  Medications:  Allergies as of 07/13/2021       Reactions   Pneumococcal Vaccines Other (See Comments)   Really high fever, and flu symptoms. MD instructed not to give   Cleocin [clindamycin Hcl] Other (See Comments)   "irritated my esophagus"   Morphine And Related Nausea And Vomiting   Other Nausea And Vomiting, Swelling   Shrimp if eat a lot   Shellfish-derived Products Other (See Comments)   Patient stated she is allergic to "shrimp," if she eats "a lot"   Ativan [lorazepam] Other (See Comments)   Difficulty waking   Buprenorphine Hcl Nausea And Vomiting   Pt does not know if she has ever had this medication        Medication List         Accurate as of July 13, 2021  2:09 PM. If you have any questions, ask your nurse or doctor.          acetaminophen 500 MG tablet Commonly known as: TYLENOL Take 500 mg by mouth every 6 (six) hours as needed for mild pain, headache or fever.   albuterol 108 (90 Base) MCG/ACT inhaler Commonly known as: VENTOLIN HFA Inhale 2 puffs into the lungs every 6 (six) hours as needed for wheezing or shortness of breath.   Alogliptin Benzoate 12.5 MG Tabs Take 1 tablet by mouth daily.   amLODipine 2.5 MG tablet Commonly known as: NORVASC TAKE 1 TABLET BY MOUTH EVERY DAY   ammonium lactate 12 % lotion Commonly known as: LAC-HYDRIN Apply 1 application topically daily.   apixaban 5 MG Tabs tablet Commonly known as: Eliquis Take 1 tablet (5 mg total) by mouth 2 (two) times daily.   atorvastatin 10 MG tablet Commonly known as: LIPITOR Take 10 mg by mouth at bedtime.   carvedilol 6.25 MG tablet Commonly known as: COREG TAKE 1 TABLET (6.25 MG TOTAL) BY MOUTH IN THE MORNING, AT NOON, AND AT BEDTIME.   cholecalciferol 25 MCG (1000 UNIT) tablet Commonly known as: VITAMIN D Take 1,000 Units by mouth in the morning.   fluticasone 50 MCG/ACT nasal spray Commonly known as: FLONASE Place 1 spray  into both nostrils daily as needed for allergies or rhinitis.   furosemide 20 MG tablet Commonly known as: LASIX Take 20 mg by mouth daily as needed for edema (weight gain of 2 lbs or more x 2 days).   glipiZIDE 10 MG 24 hr tablet Commonly known as: GLUCOTROL XL Take 10 mg by mouth in the morning and at bedtime.   Januvia 100 MG tablet Generic drug: sitaGLIPtin Take 100 mg by mouth daily.   loratadine 10 MG tablet Commonly known as: CLARITIN Take 10 mg by mouth in the morning.   montelukast 10 MG tablet Commonly known as: SINGULAIR Take 10 mg by mouth at bedtime.   omeprazole 20 MG capsule Commonly known as: PRILOSEC Take 20 mg by mouth in the morning.   sertraline 50 MG  tablet Commonly known as: ZOLOFT Take 50 mg by mouth in the morning.   sodium bicarbonate 650 MG tablet Take 650 mg by mouth 2 (two) times daily.   solifenacin 5 MG tablet Commonly known as: VESICARE Take 5 mg by mouth daily.   vitamin C 1000 MG tablet Take 1,000 mg by mouth in the morning.   VITAMIN E PO Take 1 capsule by mouth daily. Unknown strength   Zinc 50 MG Tabs Take 50 mg by mouth in the morning.        Allergies:  Allergies  Allergen Reactions   Pneumococcal Vaccines Other (See Comments)    Really high fever, and flu symptoms. MD instructed not to give   Cleocin [Clindamycin Hcl] Other (See Comments)    "irritated my esophagus"   Morphine And Related Nausea And Vomiting   Other Nausea And Vomiting and Swelling    Shrimp if eat a lot   Shellfish-Derived Products Other (See Comments)    Patient stated she is allergic to "shrimp," if she eats "a lot"   Ativan [Lorazepam] Other (See Comments)    Difficulty waking   Buprenorphine Hcl Nausea And Vomiting    Pt does not know if she has ever had this medication    Past Medical History, Surgical history, Social history, and Family History were reviewed and updated.  Review of Systems: All other 10 point review of systems is negative.   Physical Exam:  vitals were not taken for this visit.   Wt Readings from Last 3 Encounters:  06/09/21 157 lb 3.2 oz (71.3 kg)  03/31/21 150 lb 12.8 oz (68.4 kg)  03/01/21 152 lb (68.9 kg)    Ocular: Sclerae unicteric, pupils equal, round and reactive to light Ear-nose-throat: Oropharynx clear, dentition fair Lymphatic: No cervical or supraclavicular adenopathy Lungs no rales or rhonchi, good excursion bilaterally Heart regular rate and rhythm, no murmur appreciated Abd soft, nontender, positive bowel sounds MSK no focal spinal tenderness, no joint edema Neuro: non-focal, well-oriented, appropriate affect Breasts: Deferred   Lab Results  Component Value Date   WBC 7.8  07/13/2021   HGB 11.9 (L) 07/13/2021   HCT 35.5 (L) 07/13/2021   MCV 93.7 07/13/2021   PLT 206 07/13/2021   Lab Results  Component Value Date   FERRITIN 497 (H) 03/31/2021   IRON 65 03/31/2021   TIBC 288 03/31/2021   UIBC 222 03/31/2021   IRONPCTSAT 23 03/31/2021   Lab Results  Component Value Date   RETICCTPCT 2.7 07/13/2021   RBC 3.79 (L) 07/13/2021   RBC 3.78 (L) 07/13/2021   No results found for: Nils Pyle [facility] Lab Results  Component Value Date   IGGSERUM 789 11/15/2016  IGA 150 11/15/2016   IGMSERUM 86 11/15/2016   No results found for: Odetta Pink, SPEI   Chemistry      Component Value Date/Time   NA 136 07/13/2021 1331   NA 139 11/02/2020 1136   NA 144 04/17/2017 1150   NA 141 04/18/2016 1256   K 4.2 07/13/2021 1331   K 4.6 04/17/2017 1150   K 4.4 04/18/2016 1256   CL 100 07/13/2021 1331   CL 109 (H) 04/17/2017 1150   CO2 26 07/13/2021 1331   CO2 23 04/17/2017 1150   CO2 18 (L) 04/18/2016 1256   BUN 19 07/13/2021 1331   BUN 33 (H) 11/02/2020 1136   BUN 28 (H) 04/17/2017 1150   BUN 27.3 (H) 04/18/2016 1256   CREATININE 1.22 (H) 07/13/2021 1331   CREATININE 1.3 (H) 04/17/2017 1150   CREATININE 1.4 (H) 04/18/2016 1256      Component Value Date/Time   CALCIUM 9.1 07/13/2021 1331   CALCIUM 9.6 04/17/2017 1150   CALCIUM 9.4 04/18/2016 1256   ALKPHOS 84 07/13/2021 1331   ALKPHOS 62 04/17/2017 1150   ALKPHOS 68 04/18/2016 1256   AST 17 07/13/2021 1331   AST 14 04/18/2016 1256   ALT 23 07/13/2021 1331   ALT 20 04/17/2017 1150   ALT 23 04/18/2016 1256   BILITOT 0.4 07/13/2021 1331   BILITOT <0.22 04/18/2016 1256       Impression and Plan: Ms. Heldt is a very pleasant 66 yo caucasian female with remote history of stage I ductal carcinoma of the left breast diagnosed in 2003 as well as iron deficiency.  Iron studies are pending.  No ESA needed, Hgb 11.9.  Follow-up in 4  months.   Lottie Dawson, NP 1/10/20232:09 PM

## 2021-07-14 LAB — IRON AND IRON BINDING CAPACITY (CC-WL,HP ONLY)
Iron: 78 ug/dL (ref 28–170)
Saturation Ratios: 28 % (ref 10.4–31.8)
TIBC: 274 ug/dL (ref 250–450)
UIBC: 196 ug/dL (ref 148–442)

## 2021-07-14 LAB — FERRITIN: Ferritin: 379 ng/mL — ABNORMAL HIGH (ref 11–307)

## 2021-07-15 ENCOUNTER — Telehealth: Payer: Self-pay | Admitting: *Deleted

## 2021-07-15 NOTE — Telephone Encounter (Signed)
Per 07/13/21 los - called and lvm of upcoming appointments - requested call back to confirm

## 2021-08-31 ENCOUNTER — Ambulatory Visit: Payer: Medicare Other | Admitting: Cardiology

## 2021-10-05 ENCOUNTER — Ambulatory Visit: Payer: Medicare Other | Admitting: Cardiology

## 2021-10-13 ENCOUNTER — Other Ambulatory Visit: Payer: Self-pay | Admitting: Cardiology

## 2021-10-13 NOTE — Telephone Encounter (Signed)
Prescription refill request for Eliquis received. ? ?Indication: afib  ?Last office visit: Camnitz, 06/09/2021 ?Scr: 1.22, 07/13/2021 ?Age: 66 yo  ?Weight: 71.3 kg  ? ?Refill sent.  ?

## 2021-11-10 ENCOUNTER — Inpatient Hospital Stay: Payer: Medicare Other | Attending: Hematology & Oncology

## 2021-11-10 ENCOUNTER — Encounter: Payer: Self-pay | Admitting: Family

## 2021-11-10 ENCOUNTER — Inpatient Hospital Stay: Payer: Medicare Other

## 2021-11-10 ENCOUNTER — Inpatient Hospital Stay (HOSPITAL_BASED_OUTPATIENT_CLINIC_OR_DEPARTMENT_OTHER): Payer: Medicare Other | Admitting: Family

## 2021-11-10 VITALS — BP 105/67 | HR 93 | Temp 98.4°F | Resp 17 | Wt 156.0 lb

## 2021-11-10 DIAGNOSIS — D508 Other iron deficiency anemias: Secondary | ICD-10-CM

## 2021-11-10 DIAGNOSIS — D631 Anemia in chronic kidney disease: Secondary | ICD-10-CM | POA: Diagnosis not present

## 2021-11-10 DIAGNOSIS — Z08 Encounter for follow-up examination after completed treatment for malignant neoplasm: Secondary | ICD-10-CM | POA: Diagnosis present

## 2021-11-10 DIAGNOSIS — N183 Chronic kidney disease, stage 3 unspecified: Secondary | ICD-10-CM | POA: Diagnosis not present

## 2021-11-10 DIAGNOSIS — Z853 Personal history of malignant neoplasm of breast: Secondary | ICD-10-CM | POA: Insufficient documentation

## 2021-11-10 DIAGNOSIS — D509 Iron deficiency anemia, unspecified: Secondary | ICD-10-CM | POA: Insufficient documentation

## 2021-11-10 LAB — CBC WITH DIFFERENTIAL (CANCER CENTER ONLY)
Abs Immature Granulocytes: 0.05 10*3/uL (ref 0.00–0.07)
Basophils Absolute: 0.1 10*3/uL (ref 0.0–0.1)
Basophils Relative: 1 %
Eosinophils Absolute: 0.3 10*3/uL (ref 0.0–0.5)
Eosinophils Relative: 3 %
HCT: 34.5 % — ABNORMAL LOW (ref 36.0–46.0)
Hemoglobin: 11.4 g/dL — ABNORMAL LOW (ref 12.0–15.0)
Immature Granulocytes: 1 %
Lymphocytes Relative: 19 %
Lymphs Abs: 2 10*3/uL (ref 0.7–4.0)
MCH: 31.3 pg (ref 26.0–34.0)
MCHC: 33 g/dL (ref 30.0–36.0)
MCV: 94.8 fL (ref 80.0–100.0)
Monocytes Absolute: 0.6 10*3/uL (ref 0.1–1.0)
Monocytes Relative: 5 %
Neutro Abs: 7.6 10*3/uL (ref 1.7–7.7)
Neutrophils Relative %: 71 %
Platelet Count: 265 10*3/uL (ref 150–400)
RBC: 3.64 MIL/uL — ABNORMAL LOW (ref 3.87–5.11)
RDW: 13.8 % (ref 11.5–15.5)
WBC Count: 10.7 10*3/uL — ABNORMAL HIGH (ref 4.0–10.5)
nRBC: 0 % (ref 0.0–0.2)

## 2021-11-10 LAB — CMP (CANCER CENTER ONLY)
ALT: 18 U/L (ref 0–44)
AST: 16 U/L (ref 15–41)
Albumin: 4.7 g/dL (ref 3.5–5.0)
Alkaline Phosphatase: 64 U/L (ref 38–126)
Anion gap: 13 (ref 5–15)
BUN: 28 mg/dL — ABNORMAL HIGH (ref 8–23)
CO2: 21 mmol/L — ABNORMAL LOW (ref 22–32)
Calcium: 9.1 mg/dL (ref 8.9–10.3)
Chloride: 105 mmol/L (ref 98–111)
Creatinine: 1.44 mg/dL — ABNORMAL HIGH (ref 0.44–1.00)
GFR, Estimated: 40 mL/min — ABNORMAL LOW (ref >60)
Glucose, Bld: 170 mg/dL — ABNORMAL HIGH (ref 70–99)
Potassium: 4 mmol/L (ref 3.5–5.1)
Sodium: 139 mmol/L (ref 135–145)
Total Bilirubin: 0.3 mg/dL (ref 0.3–1.2)
Total Protein: 7.4 g/dL (ref 6.5–8.1)

## 2021-11-10 LAB — RETICULOCYTES
Immature Retic Fract: 18.3 % — ABNORMAL HIGH (ref 2.3–15.9)
RBC.: 3.64 MIL/uL — ABNORMAL LOW (ref 3.87–5.11)
Retic Count, Absolute: 104.8 10*3/uL (ref 19.0–186.0)
Retic Ct Pct: 2.9 % (ref 0.4–3.1)

## 2021-11-10 LAB — FERRITIN: Ferritin: 131 ng/mL (ref 11–307)

## 2021-11-10 NOTE — Progress Notes (Signed)
?Hematology and Oncology Follow Up Visit ? ?[name] ?[id number] ?[birth year] 66 y.o. ?11/10/2021 ? ? 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  ?Current Therapy:        ?IV iron as indicated ?Aranesp 300 mcg sq q 3-4 week for Hgb < 11 ?  ?Interim History:  [name] is here today for follow-up. She is symptomatic with fatigue.  ?She has some SOB and dizziness with over exertion. She takes a break to rest when needed.  ?She has palpitations with a fib occasionally.  ?No fever, chills, cough, rash, chest pain, palpitations, abdominal pain or changes in bowel or bladder habits.  ?No swelling or tenderness in her extremities.  ?She has occasional tingling in her fingertips. This comes and goes and she states that she has neck issues.  ?No falls or syncope.  ?She has maintained a good appetite and is staying well hydrated. Her weight is stable at 156 lbs.  ? ?ECOG Performance Status: 1 - Symptomatic but completely ambulatory ? ?Medications:  ?Allergies as of 11/10/2021   ? ?   Reactions  ? Pneumococcal Vaccines Other (See Comments)  ? Really high fever, and flu symptoms. MD instructed not to give  ? Cleocin [clindamycin Hcl] Other (See Comments)  ? "irritated my esophagus"  ? Morphine And Related Nausea And Vomiting  ? Other Nausea And Vomiting, Swelling  ? Shrimp if eat a lot  ? Shellfish-derived Products Other (See Comments)  ? Patient stated she is allergic to "shrimp," if she eats "a lot"  ? Ativan [lorazepam] Other (See Comments)  ? Difficulty waking  ? Buprenorphine Hcl Nausea And Vomiting  ? Pt does not know if she has ever had this medication  ? ?  ? ?  ?Medication List  ?  ? ?  ? Accurate as of Nov 10, 2021  1:23 PM. If you have any questions, ask your nurse or doctor.  ?  ?  ? ?  ? ?acetaminophen 500 MG tablet ?Commonly known as: TYLENOL ?Take 500 mg by mouth every 6  (six) hours as needed for mild pain, headache or fever. ?  ?albuterol 108 (90 Base) MCG/ACT inhaler ?Commonly known as: VENTOLIN HFA ?Inhale 2 puffs into the lungs every 6 (six) hours as needed for wheezing or shortness of breath. ?  ?Alogliptin Benzoate 12.5 MG Tabs ?Take 1 tablet by mouth daily. ?  ?amLODipine 2.5 MG tablet ?Commonly known as: NORVASC ?TAKE 1 TABLET BY MOUTH EVERY DAY ?  ?ammonium lactate 12 % lotion ?Commonly known as: LAC-HYDRIN ?Apply 1 application topically daily. ?  ?atorvastatin 10 MG tablet ?Commonly known as: LIPITOR ?Take 10 mg by mouth at bedtime. ?  ?carvedilol 6.25 MG tablet ?Commonly known as: COREG ?TAKE 1 TABLET (6.25 MG TOTAL) BY MOUTH IN THE MORNING, AT NOON, AND AT BEDTIME. ?  ?cholecalciferol 25 MCG (1000 UNIT) tablet ?Commonly known as: VITAMIN D ?Take 1,000 Units by mouth in the morning. ?  ?Eliquis 5 MG Tabs tablet ?Generic drug: apixaban ?TAKE 1 TABLET BY MOUTH TWICE A DAY ?  ?fluticasone 50 MCG/ACT nasal spray ?Commonly known as: FLONASE ?Place 1 spray into both nostrils daily as needed for allergies or rhinitis. ?  ?furosemide 20 MG tablet ?Commonly known as: LASIX ?Take 20 mg by mouth daily as needed for edema (weight gain of 2 lbs or more x 2 days). ?  ?glipiZIDE 10 MG  24 hr tablet ?Commonly known as: GLUCOTROL XL ?Take 10 mg by mouth in the morning and at bedtime. ?  ?Januvia 100 MG tablet ?Generic drug: sitaGLIPtin ?Take 100 mg by mouth daily. ?  ?loratadine 10 MG tablet ?Commonly known as: CLARITIN ?Take 10 mg by mouth in the morning. ?  ?montelukast 10 MG tablet ?Commonly known as: SINGULAIR ?Take 10 mg by mouth at bedtime. ?  ?omeprazole 20 MG capsule ?Commonly known as: PRILOSEC ?Take 20 mg by mouth in the morning. ?  ?sertraline 50 MG tablet ?Commonly known as: ZOLOFT ?Take 50 mg by mouth in the morning. ?  ?sodium bicarbonate 650 MG tablet ?Take 650 mg by mouth 2 (two) times daily. ?  ?solifenacin 5 MG tablet ?Commonly known as: VESICARE ?Take 5 mg by mouth  daily. ?  ?Trulicity 1.75 ZW/2.5EN Sopn ?Generic drug: Dulaglutide ?Inject 0.75 mg into the skin once a week. ?  ?vitamin C 1000 MG tablet ?Take 1,000 mg by mouth in the morning. ?  ?VITAMIN E PO ?Take 1 capsule by mouth daily. Unknown strength ?  ?Zinc 50 MG Tabs ?Take 50 mg by mouth in the morning. ?  ? ?  ? ? ?Allergies:  ?Allergies  ?Allergen Reactions  ? Pneumococcal Vaccines Other (See Comments)  ?  Really high fever, and flu symptoms. MD instructed not to give  ? Cleocin [Clindamycin Hcl] Other (See Comments)  ?  "irritated my esophagus"  ? Morphine And Related Nausea And Vomiting  ? Other Nausea And Vomiting and Swelling  ?  Shrimp if eat a lot  ? Shellfish-Derived Products Other (See Comments)  ?  Patient stated she is allergic to "shrimp," if she eats "a lot"  ? Ativan [Lorazepam] Other (See Comments)  ?  Difficulty waking  ? Buprenorphine Hcl Nausea And Vomiting  ?  Pt does not know if she has ever had this medication  ? ? ?Past Medical History, Surgical history, Social history, and Family History were reviewed and updated. ? ?Review of Systems: ?All other 10 point review of systems is negative.  ? ?Physical Exam: ? weight is 156 lb (70.8 kg). Her oral temperature is 98.4 ?F (36.9 ?C). Her blood pressure is 105/67 and her pulse is 93. Her respiration is 17 and oxygen saturation is 97%.  ? ?Wt Readings from Last 3 Encounters:  ?11/10/21 156 lb (70.8 kg)  ?07/13/21 157 lb 1.9 oz (71.3 kg)  ?06/09/21 157 lb 3.2 oz (71.3 kg)  ? ? ?Ocular: Sclerae unicteric, pupils equal, round and reactive to light ?Ear-nose-throat: Oropharynx clear, dentition fair ?Lymphatic: No cervical or supraclavicular adenopathy ?Lungs no rales or rhonchi, good excursion bilaterally ?Heart regular rate and rhythm, no murmur appreciated ?Abd soft, nontender, positive bowel sounds ?MSK no focal spinal tenderness, no joint edema ?Neuro: non-focal, well-oriented, appropriate affect ?Breasts: Deferred  ? ?Lab Results  ?Component Value Date   ? WBC 10.7 (H) 11/10/2021  ? HGB 11.4 (L) 11/10/2021  ? HCT 34.5 (L) 11/10/2021  ? MCV 94.8 11/10/2021  ? PLT 265 11/10/2021  ? ?Lab Results  ?Component Value Date  ? FERRITIN 379 (H) 07/13/2021  ? IRON 78 07/13/2021  ? TIBC 274 07/13/2021  ? UIBC 196 07/13/2021  ? IRONPCTSAT 28 07/13/2021  ? ?Lab Results  ?Component Value Date  ? RETICCTPCT 2.9 11/10/2021  ? RBC 3.64 (L) 11/10/2021  ? ?No results found for: KPAFRELGTCHN, LAMBDASER, KAPLAMBRATIO ?Lab Results  ?Component Value Date  ? IGGSERUM 789 11/15/2016  ? IGA 150 11/15/2016  ?  IGMSERUM 86 11/15/2016  ? ?No results found for: TOTALPROTELP, ALBUMINELP, A1GS, A2GS, BETS, BETA2SER, GAMS, MSPIKE, SPEI ?  Chemistry   ?   ?Component Value Date/Time  ? NA 136 07/13/2021 1331  ? NA 139 11/02/2020 1136  ? NA 144 04/17/2017 1150  ? NA 141 04/18/2016 1256  ? K 4.2 07/13/2021 1331  ? K 4.6 04/17/2017 1150  ? K 4.4 04/18/2016 1256  ? CL 100 07/13/2021 1331  ? CL 109 (H) 04/17/2017 1150  ? CO2 26 07/13/2021 1331  ? CO2 23 04/17/2017 1150  ? CO2 18 (L) 04/18/2016 1256  ? BUN 19 07/13/2021 1331  ? BUN 33 (H) 11/02/2020 1136  ? BUN 28 (H) 04/17/2017 1150  ? BUN 27.3 (H) 04/18/2016 1256  ? CREATININE 1.22 (H) 07/13/2021 1331  ? CREATININE 1.3 (H) 04/17/2017 1150  ? CREATININE 1.4 (H) 04/18/2016 1256  ?    ?Component Value Date/Time  ? CALCIUM 9.1 07/13/2021 1331  ? CALCIUM 9.6 04/17/2017 1150  ? CALCIUM 9.4 04/18/2016 1256  ? ALKPHOS 84 07/13/2021 1331  ? ALKPHOS 62 04/17/2017 1150  ? ALKPHOS 68 04/18/2016 1256  ? AST 17 07/13/2021 1331  ? AST 14 04/18/2016 1256  ? ALT 23 07/13/2021 1331  ? ALT 20 04/17/2017 1150  ? ALT 23 04/18/2016 1256  ? BILITOT 0.4 07/13/2021 1331  ? BILITOT <0.22 04/18/2016 1256  ?  ? ? ? ?Impression and Plan: Ms. Bergthold is a very pleasant 66 yo caucasian female with remote history of stage I ductal carcinoma of the left breast diagnosed in 2003 as well as iron deficiency.  ?Iron studies are pending. No ESA needed, Hgb 11.4.  ?Follow-up in 4 months.   ? ?Lottie Dawson, NP ?5/10/20231:23 PM ? ?

## 2021-11-11 LAB — IRON AND IRON BINDING CAPACITY (CC-WL,HP ONLY)
Iron: 56 ug/dL (ref 28–170)
Saturation Ratios: 18 % (ref 10.4–31.8)
TIBC: 311 ug/dL (ref 250–450)
UIBC: 255 ug/dL (ref 148–442)

## 2021-12-02 ENCOUNTER — Other Ambulatory Visit: Payer: Self-pay | Admitting: Cardiology

## 2021-12-24 ENCOUNTER — Other Ambulatory Visit: Payer: Self-pay | Admitting: Cardiology

## 2021-12-30 ENCOUNTER — Encounter: Payer: Self-pay | Admitting: Cardiology

## 2021-12-30 ENCOUNTER — Ambulatory Visit (INDEPENDENT_AMBULATORY_CARE_PROVIDER_SITE_OTHER): Payer: Medicare Other | Admitting: Cardiology

## 2021-12-30 VITALS — BP 124/70 | HR 87 | Ht 59.0 in | Wt 152.0 lb

## 2021-12-30 DIAGNOSIS — I480 Paroxysmal atrial fibrillation: Secondary | ICD-10-CM | POA: Diagnosis not present

## 2021-12-30 DIAGNOSIS — I5022 Chronic systolic (congestive) heart failure: Secondary | ICD-10-CM

## 2021-12-30 DIAGNOSIS — R7982 Elevated C-reactive protein (CRP): Secondary | ICD-10-CM | POA: Insufficient documentation

## 2021-12-30 DIAGNOSIS — M3500 Sicca syndrome, unspecified: Secondary | ICD-10-CM | POA: Insufficient documentation

## 2021-12-30 DIAGNOSIS — M79609 Pain in unspecified limb: Secondary | ICD-10-CM | POA: Insufficient documentation

## 2021-12-30 DIAGNOSIS — D6869 Other thrombophilia: Secondary | ICD-10-CM

## 2021-12-30 DIAGNOSIS — I420 Dilated cardiomyopathy: Secondary | ICD-10-CM

## 2021-12-30 DIAGNOSIS — M1991 Primary osteoarthritis, unspecified site: Secondary | ICD-10-CM | POA: Insufficient documentation

## 2021-12-30 DIAGNOSIS — R5383 Other fatigue: Secondary | ICD-10-CM | POA: Insufficient documentation

## 2021-12-30 DIAGNOSIS — N1832 Chronic kidney disease, stage 3b: Secondary | ICD-10-CM | POA: Insufficient documentation

## 2021-12-30 NOTE — Progress Notes (Signed)
Cardiology Office Note:    Date:  12/30/2021   ID:  Sloka, Volante 07-Apr-1956, MRN [id number]  PCP:  Enid Skeens., MD  Cardiologist:  Jenne Campus, MD    Referring MD: Enid Skeens., MD   Chief Complaint  Patient presents with   Follow-up  Doing fine  History of Present Illness:    [name] is a 66 y.o. female with past medical history significant for paroxysmal atrial fibrillation, her CHADS2 Vascor equals 4, she is anticoagulated, status post atrial fibrillation ablation done in Nov 26, 2990 sadly complicated with retroperitoneal bleed luckily we were able to manage distress conservatively, after that she also had episode of atrial fibrillation while she was anemic.  She was put temporarily on amiodarone now off 8.  Also history of cardiomyopathy with mild to moderate mitral regurgitation, last echocardiogram done in December 2022 showed low normal ejection fraction, essential hypertension, diabetes, dyslipidemia. She comes today 2 months of follow-up overall doing very well.  Denies have any chest pain tightness squeezing pressure burning chest.  Rare short lasting palpitations more in form of skipped beats rather than sustained arrhythmias.  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 difficile colitis    Calculus of kidney 08/12/2012   CHF (congestive heart failure) ([place])    "mild" (05/15/2013)   CHF (congestive heart failure) ([place])    "mild" (05/15/2013)   Chronic bronchitis (H[facility])     Chronic lower back pain    Chronic non-seasonal allergic rhinitis 42/68/3419   Chronic systolic congestive heart failure (H[facility])    Chronic UTI    Cough variant asthma vs uacs  07/24/2017   Onset in her 20s some better p rx with allergy shots in her 30s Singulair trial 11/15/16 >  Improved 01/18/17:  FEV1 1.29 L (61%)  Ratio 75  Still on ACEi as of 07/24/2017  Spirometry 06/28/18   FEV1 1.3 (61%)  Ratio 77 with min curvature   - Allergy profile 07/26/2018 >  Eos 1.2 /  IgE  420  RAST Pos mold only    Cough variant asthma vs uacs  07/24/2017   Onset in her 20s some better p rx with allergy shots in her 30s Singulair trial 11/15/16 >  Improved 01/18/17:  FEV1 1.29 L (61%)  Ratio 75  Still on ACEi as of 07/24/2017  Spirometry 06/28/18   FEV1 1.3 (61%)  Ratio 77 with min curvature   - Allergy profile 07/26/2018 >  Eos 1.2 /  IgE  420  RAST Pos mold only    Diabetes mellitus type 2 in obese ([place])    Diarrhea 11/15/2016   Dilated cardiomyopathy ([place]) 07/16/2019   DOE (dyspnea on exertion) 07/27/2018   Onset early 2019 assoc with uacs while on ACEi - 06/28/18    Walked RA x one lap =  approx 250 ft - stopped due to sob with sats still high 90's      Dysphagia    Dysrhythmia    atrial  fib/[facility]   Family history of anesthesia complication    "my mother also had PONV" (05/15/2013)   Fever 10/29/2017   Foot pain 04/09/2018   GERD (gastroesophageal reflux disease)    Heart murmur    High cholesterol    History of breast cancer in female 07/19/2013   HTN (hypertension) 05/08/2013   Hydronephrosis of right kidney    Hyperlipidemia 11/15/2016   Hypomagnesemia    Hyponatremia 10/29/2017   IDA (iron deficiency anemia) 04/19/2017   Infection    Kidney stones    Left breast mass 07/19/2013   Low back pain 05/01/2018   Migraine    "haven't had one in the early 2000's" (05/15/2013)   Mitral regurgitation 09/03/2020   Mixed incontinence 07/24/2018   Nephrolithiasis 11/15/2016    Other chronic cystitis 05/19/2011   PAF (paroxysmal atrial fibrillation) ([place])    Personal history of chemotherapy    Personal history of radiation therapy    Pneumonia    "used to be chronic; last time I had it was 2013" (05/15/2013)   PONV (postoperative nausea and vomiting)    Recurrent sinusitis 09/08/2016   Recurrent UTI 11/20/2014   Recurrent UTI (urinary tract infection)    "from the continuous kidney stones; take Macrodantin qd" (05/15/2013)   Referred otalgia of left ear 04/22/2021   Respiratory failure requiring intubation ([place]) 05/08/2013   Restrictive lung disease 01/18/2017   Retroperitoneal bleed 12/05/2020   Sacroiliac joint pain 05/16/2018   Sepsis ([place]) 2005   kidney stone infection   Septic shock ([place]) 09/23/2018   Simple chronic bronchitis ([place]) 11/15/2016   Strep throat 10/29/2017   Tachypnea    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   Tension headache    Type II diabetes mellitus ([place])    Type II or unspecified type diabetes mellitus without mention of complication, not stated as uncontrolled 05/11/2013   Urinary tract stones 05/19/2011   Urinary urgency 07/24/2018    Past Surgical History:  Procedure Laterality Date   ATRIAL FIBRILLATION ABLATION N/A 11/25/2020   Procedure: ATRIAL FIBRILLATION ABLATION;  Surgeon: Constance Haw, MD;  Location: [facility];  Service: Cardiovascular;  Laterality: N/A;   BILATERAL OOPHORECTOMY Bilateral 2006   "cause I needed to 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" (05/15/2013)   BREAST BIOPSY Left 02/2001   BREAST LUMPECTOMY Left 02/2001   BREAST LUMPECTOMY Left 09/23/2013   Procedure: LEFT LUMPECTOMY WITH SPECIMEN MAMMOGRAM;  Surgeon: Adin Hector, MD;  Location: [facility];  Service: General;  Laterality: Left;   BUBBLE STUDY  11/24/2020   Procedure: BUBBLE STUDY;  Surgeon: Geralynn Rile, MD;  Location: [facility];  Service: Cardiovascular;;   COLONOSCOPY      DIAGNOSTIC LAPAROSCOPY     cyst-near ovary   LITHOTRIPSY     "2-3 times prior to 2002" (05/15/2013)   SPINAL FUSION N/A 05/08/2013   Procedure: T9-S1 INSTRUMENTED FUSION T12 -S1 DECOMPRESSION;  Surgeon: Melina Schools, MD;  Location: [facility];  Service: Orthopedics;  Laterality: N/A;   TEE WITHOUT CARDIOVERSION N/A 11/06/2020   Procedure: TRANSESOPHAGEAL ECHOCARDIOGRAM (TEE);  Surgeon: Fay Records, MD;  Location: [facility];  Service: Cardiovascular;  Laterality: N/A;   TEE WITHOUT CARDIOVERSION N/A 11/24/2020   Procedure: TRANSESOPHAGEAL ECHOCARDIOGRAM (TEE);  Surgeon: Geralynn Rile, MD;  Location: [facility];  Service: Cardiovascular;  Laterality: N/A;   TUBAL LIGATION Bilateral 1985    Current Medications: Current Meds  Medication Sig  acetaminophen (TYLENOL) 500 MG tablet Take 500 mg by mouth every 6 (six) hours as needed for mild pain, headache or fever.   albuterol (VENTOLIN HFA) 108 (90 Base) MCG/ACT inhaler Inhale 2 puffs into the lungs every 6 (six) hours as needed for wheezing or shortness of breath.   Alogliptin Benzoate 12.5 MG TABS Take 1 tablet by mouth daily.   amLODipine (NORVASC) 2.5 MG tablet TAKE 1 TABLET BY MOUTH EVERY DAY (Patient taking differently: Take 2.5 mg by mouth daily.)   ammonium lactate (LAC-HYDRIN) 12 % lotion Apply 1 application topically daily.   Ascorbic Acid (VITAMIN C) 1000 MG tablet Take 1,000 mg by mouth in the morning.   atorvastatin (LIPITOR) 10 MG tablet Take 10 mg by mouth at bedtime.   carvedilol (COREG) 6.25 MG tablet TAKE 1 TABLET (6.25 MG TOTAL) BY MOUTH IN THE MORNING, AT NOON, AND AT BEDTIME. (Patient taking differently: Take 6.25 mg by mouth 2 (two) times daily with a meal.)   cholecalciferol (VITAMIN D) 25 MCG (1000 UNIT) tablet Take 1,000 Units by mouth in the morning.   ELIQUIS 5 MG TABS tablet TAKE 1 TABLET BY MOUTH TWICE A DAY (Patient taking differently: Take 5 mg by mouth 2 (two) times daily.)   fluticasone (FLONASE) 50  MCG/ACT nasal spray Place 1 spray into both nostrils daily as needed for allergies or rhinitis.   furosemide (LASIX) 20 MG tablet Take 20 mg by mouth daily as needed for edema (weight gain of 2 lbs or more x 2 days).   glipiZIDE (GLUCOTROL XL) 10 MG 24 hr tablet Take 10 mg by mouth in the morning and at bedtime.    JANUVIA 100 MG tablet Take 100 mg by mouth daily.   loratadine (CLARITIN) 10 MG tablet Take 10 mg by mouth in the morning.   montelukast (SINGULAIR) 10 MG tablet Take 10 mg by mouth at bedtime.   omeprazole (PRILOSEC) 20 MG capsule Take 20 mg by mouth in the morning.   sertraline (ZOLOFT) 50 MG tablet Take 50 mg by mouth in the morning.   sodium bicarbonate 650 MG tablet Take 650 mg by mouth 2 (two) times daily.   solifenacin (VESICARE) 5 MG tablet Take 5 mg by mouth daily.   TRULICITY 2.99 ME/2.6ST SOPN Inject 1.5 mg into the skin once a week.   VITAMIN E PO Take 1 capsule by mouth daily. Unknown strength   Zinc 50 MG TABS Take 50 mg by mouth in the morning.     Allergies:   Pneumococcal vaccines, Cleocin [clindamycin hcl], Morphine and related, Other, Shellfish-derived products, Ativan [lorazepam], and Buprenorphine hcl   Social History   Socioeconomic History   Marital status: Married    Spouse name: Not on file   Number of children: Not on file   Years of education: Not on file   Highest education level: Not on file  Occupational History   Not on file  Tobacco Use   Smoking status: Never   Smokeless tobacco: Never   Tobacco comments:    Mother & Father smoked  Vaping Use   Vaping Use: Never used  Substance and Sexual Activity   Alcohol use: No    Alcohol/week: 0.0 standard drinks of alcohol   Drug use: No   Sexual activity: Yes  Other Topics Concern   Not on file  Social History Narrative   [facility] (11/15/16):   Patient's originally from [place]. Has always lived in [place]. Currently has an outside dog.  Remote exposure to [place] when  her children were young. No known mold exposure. 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    Social Determinants of Health   Financial Resource Strain: Not on file  Food Insecurity: Not on file  Transportation Needs: Not on file  Physical Activity: Not on file  Stress: Not on file  Social Connections: Not on file     Family History: The patient's family history includes Breast cancer in her mother; COPD in her mother; Environmental Allergies in her son and another family member; Heart disease in her mother; Kidney cancer in her father; Liver cancer in her brother; Lupus in an other family member. ROS:   Please see the history of present illness.    All 14 point review of systems negative except as described per history of present illness  EKGs/Labs/Other Studies Reviewed:      Recent Labs: 11/10/2021: ALT 18; BUN 28; Creatinine 1.44; Hemoglobin 11.4; Platelet Count 265; Potassium 4.0; Sodium 139  Recent Lipid Panel    Component Value Date/Time   TRIG 155 (H) 05/08/2013 2023    Physical Exam:    VS:  BP 124/70 (BP Location: Left Arm, Patient Position: Sitting)   Pulse 87   Ht '4\' 11"'$  (1.499 m)   Wt 152 lb (68.9 kg)   SpO2 93%   BMI 30.70 kg/m     Wt Readings from Last 3 Encounters:  12/30/21 152 lb (68.9 kg)  11/10/21 156 lb (70.8 kg)  07/13/21 157 lb 1.9 oz (71.3 kg)     GEN:  Well nourished, well developed in no acute distress HEENT: Normal NECK: No JVD; No carotid bruits LYMPHATICS: No lymphadenopathy CARDIAC: RRR, soft holosystolic murmur grade 1/6 best heard left border of sternum, no rubs, no gallops RESPIRATORY:  Clear to auscultation without rales, wheezing or rhonchi  ABDOMEN: Soft, non-tender, non-distended MUSCULOSKELETAL:  No edema; No deformity  SKIN: Warm and dry LOWER EXTREMITIES: no swelling NEUROLOGIC:  Alert and oriented x 3 PSYCHIATRIC:  Normal affect   ASSESSMENT:    1. PAF (paroxysmal atrial  fibrillation) ([place])   2. Dilated cardiomyopathy ([place])   3. Chronic systolic congestive heart failure ([place])   4. Acquired thrombophilia ([place])    PLAN:    In order of problems listed above:  Paroxysmal atrial fibrillation seems to be in sinus rhythm today.  She is anticoagulated which I will continue, CHADS2 Vascor equals 4. History of dilated cardiomyopathy, last echocardiogram showed low normal ejection fraction.  She is on carvedilol which I will continue, in 6 months with repeat echocardiogram to recheck left ventricle ejection fraction. Congestive heart failure: Compensated, she is New York Heart Association class II. 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  I did review K PN which show me last hemoglobin A1c from Nov 10, 2021 of 8.0.  She need to be better controlled she understand that she is trying to work on it. Dyslipidemia, I did review K PN which show me her total cholesterol 101 HDL 24, she is on Lipitor 10 which I will continue.  This is moderate intensity statin Mitral regurgitation mild to moderate based on last echocardiogram.  We will continue monitoring.   Medication Adjustments/Labs and Tests Ordered: Current medicines are reviewed at length with the patient today.  Concerns regarding medicines are outlined above.  No orders of the defined types were placed in this encounter.  Medication changes: No orders of the defined types  were placed in this encounter.   Signed, Park Liter, MD, [facility] 12/30/2021 8:21 AM    [place]

## 2021-12-30 NOTE — Patient Instructions (Signed)
Medication Instructions:  Your physician recommends that you continue on your current medications as directed. Please refer to the Current Medication list given to you today.  *If you need a refill on your cardiac medications before your next appointment, please call your pharmacy*   Lab Work: None If you have labs (blood work) drawn today and your tests are completely normal, you will receive your results only by: MyChart Message (if you have MyChart) OR A paper copy in the mail If you have any lab test that is abnormal or we need to change your treatment, we will call you to review the results.   Testing/Procedures: None   Follow-Up: At [facility], you and your health needs are our priority.  As part of our continuing mission to provide you with exceptional heart care, we have created designated Provider Care Teams.  These Care Teams include your primary Cardiologist (physician) and Advanced Practice Providers (APPs -  Physician Assistants and Nurse Practitioners) who all work together to provide you with the care you need, when you need it.  We recommend signing up for the patient portal called "MyChart".  Sign up information is provided on this After Visit Summary.  MyChart is used to connect with patients for Virtual Visits (Telemedicine).  Patients are able to view lab/test results, encounter notes, upcoming appointments, etc.  Non-urgent messages can be sent to your provider as well.   To learn more about what you can do with MyChart, go to [email or web address].    Your next appointment:   6 month(s)  The format for your next appointment:   In Person  Provider:   Robert Krasowski, MD    Other Instructions None  Important Information About Sugar       

## 2022-01-27 ENCOUNTER — Other Ambulatory Visit: Payer: Self-pay | Admitting: Cardiology

## 2022-02-08 DIAGNOSIS — J323 Chronic sphenoidal sinusitis: Secondary | ICD-10-CM | POA: Insufficient documentation

## 2022-02-08 HISTORY — DX: Chronic sphenoidal sinusitis: J32.3

## 2022-02-10 ENCOUNTER — Other Ambulatory Visit: Payer: Self-pay | Admitting: *Deleted

## 2022-02-10 NOTE — Patient Outreach (Signed)
  Care Coordination   Initial Visit Note   02/10/2022 Name: ELLORA VARNUM MRN: [id number] DOB: 1956/03/06  SOMMER SPICKARD is a 66 y.o. year old female who sees [name], [name]., MD for primary care. I spoke with  [name] by phone today  What matters to the patients health and wellness today?  Working on my diabetes and my weight.     Goals Addressed             This Visit's Progress    Patient Stated A1C 7or less          SDOH assessments and interventions completed:  Yes     Care Coordination Interventions Activated:  Yes  Care Coordination Interventions:  Yes, provided   Follow up plan: Referral made to [facility] February 22, 2022  2:00 pm  Encounter Outcome:  Pt. Visit Completed   [facility] [phone]

## 2022-02-22 ENCOUNTER — Ambulatory Visit: Payer: Self-pay

## 2022-02-22 NOTE — Patient Outreach (Signed)
  Care Coordination   02/22/2022 Name: [name] MRN: [id number] DOB: 07/01/1956   Care Coordination Outreach Attempts:  An unsuccessful telephone outreach was attempted today to offer the patient information about available care coordination services as a benefit of their health plan.   Follow Up Plan:  Additional outreach attempts will be made to offer the patient care coordination information and services.   Encounter Outcome:  No Answer  Care Coordination Interventions Activated:  No   Care Coordination Interventions:  No, not indicated    Tomasa Rand, RN, BSN, CEN [facility] [employer] [phone]

## 2022-03-03 ENCOUNTER — Ambulatory Visit: Payer: Self-pay

## 2022-03-03 NOTE — Patient Outreach (Signed)
Care Coordination   Follow Up Visit Note   03/03/2022 Name: [name] MRN: [id number] DOB: January 13, 1956  [name] is a 66 y.o. year old female who sees [name], [name]., MD for primary care. I spoke with  [name] by phone today.  What matters to the patients health and wellness today?  Patient has consented for program  Reviewed with patient her concerns today: (1) wants to update her advanced directives (2) concerns about needing to lose weight, (3) worried about her DM control, (4) concerned about her heart,     Patient reports that she does not sleep well either. Sleeps during the day and stays up at night.    Goals Addressed               This Visit's Progress     I want my heart to be in good rhythm (pt-stated)        Care Coordination Interventions: Counseled on increased risk of stroke due to Afib and benefits of anticoagulation for stroke prevention Reviewed importance of adherence to anticoagulant exactly as prescribed Advised patient to discuss recent tachycardia  with provider Assessed social determinant of health barriers Encouraged patient to call cardiologist to inform of recent episode. Reviewed importance of not missing any medications. Reviewed with patient how she takes her lasix,  if she gains more than 2 pounds she takes her lasix for 2 days.       I want to lose 15 pounds (pt-stated)        Care Coordination Interventions: Evaluation of current treatment plan related to weight loss and patient's adherence to plan as established by provider Advised patient to try to be more active and follow low carb and low fat diet Provided education to patient and/or caregiver about advanced directives Reviewed medications with patient and discussed importance of taking all medications as prescribed Assessed social determinant of health barriers Mailed advanced directive packet. Reviewed healthy sleep patterns. Suggested extended release  melatonin. Encouraged patient to determine a good time for bed and waking up and stick to a schedule.        Patient Stated A1C 7or less (pt-stated)        Care Coordination Interventions: Provided education to patient about basic DM disease process Reviewed medications with patient and discussed importance of medication adherence Counseled on importance of regular laboratory monitoring as prescribed Discussed plans with patient for ongoing care management follow up and provided patient with direct contact information for care management team Advised patient, providing education and rationale, to check cbg as requested by MD and record, calling MD for findings outside established parameters Assessed social determinant of health barriers Reviewed Epic and KPN for latest A1c.  Lab not available. Labs completed yesterday per patient.  Reviewed with patient about asking for a referral for Dietician. Encouraged patient to do home exercises. Provided my contact information. Patient is active with my chart.           SDOH assessments and interventions completed:  Yes  SDOH Interventions Today    Flowsheet Row Most Recent Value  SDOH Interventions   Food Insecurity Interventions Intervention Not Indicated  Transportation Interventions Intervention Not Indicated        Care Coordination Interventions Activated:  Yes  Care Coordination Interventions:  Yes, provided   Follow up plan: Follow up call scheduled for 03/30/2022  1pm    Encounter Outcome:  Pt. Visit Completed   Tomasa Rand, RN, BSN, [place]  Care Coordinator [phone]

## 2022-03-03 NOTE — Patient Instructions (Signed)
Visit Information  Thank you for taking time to visit with me today. Please don't hesitate to contact me if I can be of assistance to you before our next scheduled telephone appointment.  Following are the goals we discussed today:   Goals Addressed               This Visit's Progress     I want my heart to be in good rhythm (pt-stated)        Care Coordination Interventions: Counseled on increased risk of stroke due to Afib and benefits of anticoagulation for stroke prevention Reviewed importance of adherence to anticoagulant exactly as prescribed Advised patient to discuss recent tachycardia  with provider Assessed social determinant of health barriers Encouraged patient to call cardiologist to inform of recent episode. Reviewed importance of not missing any medications. Reviewed with patient how she takes her lasix,  if she gains more than 2 pounds she takes her lasix for 2 days.       I want to lose 15 pounds (pt-stated)        Care Coordination Interventions: Evaluation of current treatment plan related to weight loss and patient's adherence to plan as established by provider Advised patient to try to be more active and follow low carb and low fat diet Provided education to patient and/or caregiver about advanced directives Reviewed medications with patient and discussed importance of taking all medications as prescribed Assessed social determinant of health barriers Mailed advanced directive packet. Reviewed healthy sleep patterns. Suggested extended release melatonin. Encouraged patient to determine a good time for bed and waking up and stick to a schedule.        Patient Stated A1C 7or less (pt-stated)        Care Coordination Interventions: Provided education to patient about basic DM disease process Reviewed medications with patient and discussed importance of medication adherence Counseled on importance of regular laboratory monitoring as prescribed Discussed  plans with patient for ongoing care management follow up and provided patient with direct contact information for care management team Advised patient, providing education and rationale, to check cbg as requested by MD and record, calling MD for findings outside established parameters Assessed social determinant of health barriers Reviewed Epic and KPN for latest A1c.  Lab not available. Labs completed yesterday per patient.  Reviewed with patient about asking for a referral for Dietician. Encouraged patient to do home exercises. Provided my contact information. Patient is active with my chart.            Our next appointment is by telephone on  at 03/30/2022  at 1pm Please call the care guide team at [phone] if you need to cancel or reschedule your appointment.   If you are experiencing a Mental Health or [place] or need someone to talk to, please call the Suicide and Crisis Lifeline: 988 call the [place] National Suicide Prevention Lifeline: 986 445 9193 or TTY: 817-656-1284 TTY (972) 307-0867) to talk to a trained counselor call 1-800-273-TALK (toll free, 24 hour hotline) call 911   The patient verbalized understanding of instructions, educational materials, and care plan provided today and agreed to receive a mailed copy of patient instructions, educational materials, and care plan.   Tomasa Rand, RN, BSN, CEN [facility] [employer] [phone]

## 2022-03-04 ENCOUNTER — Other Ambulatory Visit: Payer: Self-pay | Admitting: Cardiology

## 2022-03-14 ENCOUNTER — Other Ambulatory Visit: Payer: Medicare Other

## 2022-03-14 ENCOUNTER — Inpatient Hospital Stay: Payer: Medicare Other

## 2022-03-14 ENCOUNTER — Inpatient Hospital Stay: Payer: Medicare Other | Admitting: Family

## 2022-03-14 ENCOUNTER — Ambulatory Visit: Payer: Medicare Other | Admitting: Family

## 2022-03-29 ENCOUNTER — Inpatient Hospital Stay: Payer: Medicare Other | Attending: Hematology & Oncology

## 2022-03-29 ENCOUNTER — Inpatient Hospital Stay (HOSPITAL_BASED_OUTPATIENT_CLINIC_OR_DEPARTMENT_OTHER): Payer: Medicare Other | Admitting: Family

## 2022-03-29 DIAGNOSIS — Z08 Encounter for follow-up examination after completed treatment for malignant neoplasm: Secondary | ICD-10-CM | POA: Insufficient documentation

## 2022-03-29 DIAGNOSIS — D509 Iron deficiency anemia, unspecified: Secondary | ICD-10-CM | POA: Insufficient documentation

## 2022-03-29 DIAGNOSIS — Z853 Personal history of malignant neoplasm of breast: Secondary | ICD-10-CM | POA: Insufficient documentation

## 2022-03-29 DIAGNOSIS — N183 Chronic kidney disease, stage 3 unspecified: Secondary | ICD-10-CM

## 2022-03-29 DIAGNOSIS — D508 Other iron deficiency anemias: Secondary | ICD-10-CM

## 2022-03-29 DIAGNOSIS — D631 Anemia in chronic kidney disease: Secondary | ICD-10-CM

## 2022-03-29 LAB — RETICULOCYTES
Immature Retic Fract: 14.6 % (ref 2.3–15.9)
RBC.: 3.52 MIL/uL — ABNORMAL LOW (ref 3.87–5.11)
Retic Count, Absolute: 93.6 10*3/uL (ref 19.0–186.0)
Retic Ct Pct: 2.7 % (ref 0.4–3.1)

## 2022-03-29 LAB — CMP (CANCER CENTER ONLY)
ALT: 21 U/L (ref 0–44)
AST: 20 U/L (ref 15–41)
Albumin: 4.5 g/dL (ref 3.5–5.0)
Alkaline Phosphatase: 68 U/L (ref 38–126)
Anion gap: 12 (ref 5–15)
BUN: 23 mg/dL (ref 8–23)
CO2: 24 mmol/L (ref 22–32)
Calcium: 9.3 mg/dL (ref 8.9–10.3)
Chloride: 100 mmol/L (ref 98–111)
Creatinine: 1.28 mg/dL — ABNORMAL HIGH (ref 0.44–1.00)
GFR, Estimated: 46 mL/min — ABNORMAL LOW (ref >60)
Glucose, Bld: 294 mg/dL — ABNORMAL HIGH (ref 70–99)
Potassium: 3.8 mmol/L (ref 3.5–5.1)
Sodium: 136 mmol/L (ref 135–145)
Total Bilirubin: 0.5 mg/dL (ref 0.3–1.2)
Total Protein: 7.5 g/dL (ref 6.5–8.1)

## 2022-03-29 LAB — CBC WITH DIFFERENTIAL (CANCER CENTER ONLY)
Abs Immature Granulocytes: 0.03 10*3/uL (ref 0.00–0.07)
Basophils Absolute: 0.1 10*3/uL (ref 0.0–0.1)
Basophils Relative: 1 %
Eosinophils Absolute: 0.1 10*3/uL (ref 0.0–0.5)
Eosinophils Relative: 2 %
HCT: 33 % — ABNORMAL LOW (ref 36.0–46.0)
Hemoglobin: 11.2 g/dL — ABNORMAL LOW (ref 12.0–15.0)
Immature Granulocytes: 0 %
Lymphocytes Relative: 24 %
Lymphs Abs: 2 10*3/uL (ref 0.7–4.0)
MCH: 31.5 pg (ref 26.0–34.0)
MCHC: 33.9 g/dL (ref 30.0–36.0)
MCV: 93 fL (ref 80.0–100.0)
Monocytes Absolute: 0.5 10*3/uL (ref 0.1–1.0)
Monocytes Relative: 6 %
Neutro Abs: 5.7 10*3/uL (ref 1.7–7.7)
Neutrophils Relative %: 67 %
Platelet Count: 231 10*3/uL (ref 150–400)
RBC: 3.55 MIL/uL — ABNORMAL LOW (ref 3.87–5.11)
RDW: 13.6 % (ref 11.5–15.5)
WBC Count: 8.4 10*3/uL (ref 4.0–10.5)
nRBC: 0 % (ref 0.0–0.2)

## 2022-03-29 LAB — FERRITIN: Ferritin: 170 ng/mL (ref 11–307)

## 2022-03-29 NOTE — Progress Notes (Signed)
Hematology and Oncology Follow Up Visit  [name] [id number] April 01, 1956 66 y.o. 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 She is doing well but does note some fatigue and mild SOB with over exertion at times.  Hgb is stable at 11.2, MCV 93, platelets 231 and WBC count 8.4.  She states that she thinks she may have had blood in her urine recently. She feels she may have a kidney stone as she has been through this in the past. She states that she will contact urology and nephrology if needed.  No other blood loss noted. No abnormal bruising, no petechiae.  No fever, chills, n/v, cough, rash, dizziness, SOB, chest pain, palpitations, abdominal pain or changes in bowel or bladder habits.  No swelling, tenderness, numbness or tingling in her extremities at this time.  No falls or syncope reported.  Appetite and hydration have been good. [name] is stable at 150 lbs.   ECOG Performance Status: 1 - Symptomatic but completely ambulatory  Medications:  Allergies as of 03/29/2022       Reactions   Pneumococcal Vaccines Other (See Comments)   Really high fever, and flu symptoms. MD instructed not to give   Cleocin [clindamycin Hcl] Other (See Comments)   "irritated my esophagus"   Morphine And Related Nausea And Vomiting   Other Nausea And Vomiting, Swelling   Shrimp if eat a lot   Shellfish-derived Products Other (See Comments)   Patient stated she is allergic to "shrimp," if she eats "a lot"   Ativan [lorazepam] Other (See Comments)   Difficulty waking   Buprenorphine Hcl Nausea And Vomiting   Pt does not know if she has ever had this medication        Medication List         Accurate as of March 29, 2022  3:21 PM. If you have any questions, ask your nurse or doctor.          acetaminophen 500 MG tablet Commonly known as: TYLENOL Take 500 mg by mouth every 6 (six) hours as needed for mild pain, headache or fever.   albuterol 108 (90 Base) MCG/ACT inhaler Commonly known as: VENTOLIN HFA Inhale 2 puffs into the lungs every 6 (six) hours as needed for wheezing or shortness of breath.   Alogliptin Benzoate 12.5 MG Tabs Take 1 tablet by mouth daily.   amLODipine 2.5 MG tablet Commonly known as: NORVASC TAKE 1 TABLET BY MOUTH EVERY DAY   ammonium lactate 12 % lotion Commonly known as: LAC-HYDRIN Apply 1 application topically daily.   atorvastatin 10 MG tablet Commonly known as: LIPITOR Take 10 mg by mouth at bedtime.   carvedilol 6.25 MG tablet Commonly known as: COREG TAKE 1 TABLET (6.25 MG TOTAL) BY MOUTH IN THE MORNING, AT NOON, AND AT BEDTIME.   cholecalciferol 25 MCG (1000 UNIT) tablet Commonly known as: VITAMIN D3 Take 1,000 Units by mouth in the morning.   Eliquis 5 MG Tabs tablet Generic drug: apixaban TAKE 1 TABLET BY MOUTH TWICE A DAY What changed: how much to take   fluticasone 50 MCG/ACT nasal spray Commonly known as: FLONASE Place 1 spray into both  nostrils daily as needed for allergies or rhinitis.   furosemide 20 MG tablet Commonly known as: LASIX Take 20 mg by mouth daily as needed for edema (weight gain of 2 lbs or more x 2 days).   glipiZIDE 10 MG 24 hr tablet Commonly known as: GLUCOTROL XL Take 10 mg by mouth in the morning and at bedtime.   Januvia 100 MG tablet Generic drug: sitaGLIPtin Take 100 mg by mouth daily.   loratadine 10 MG tablet Commonly known as: CLARITIN Take 10 mg by mouth in the morning.   MELATONIN PO Take by mouth at bedtime.   montelukast 10 MG tablet Commonly known as: SINGULAIR Take 10 mg by mouth at bedtime.   omeprazole 20 MG capsule Commonly known as: PRILOSEC Take 20 mg by  mouth in the morning.   sertraline 50 MG tablet Commonly known as: ZOLOFT Take 50 mg by mouth in the morning.   sodium bicarbonate 650 MG tablet Take 650 mg by mouth 2 (two) times daily.   solifenacin 5 MG tablet Commonly known as: VESICARE Take 5 mg by mouth daily.   Trulicity 2.02 RK/2.7CW Sopn Generic drug: Dulaglutide Inject 1.5 mg into the skin once a week.   vitamin C 1000 MG tablet Take 1,000 mg by mouth in the morning.   VITAMIN E PO Take 1 capsule by mouth daily. Unknown strength   Zinc 50 MG Tabs Take 50 mg by mouth in the morning.        Allergies:  Allergies  Allergen Reactions   Pneumococcal Vaccines Other (See Comments)    Really high fever, and flu symptoms. MD instructed not to give   Cleocin [Clindamycin Hcl] Other (See Comments)    "irritated my esophagus"   Morphine And Related Nausea And Vomiting   Other Nausea And Vomiting and Swelling    Shrimp if eat a lot   Shellfish-Derived Products Other (See Comments)    Patient stated she is allergic to "shrimp," if she eats "a lot"   Ativan [Lorazepam] Other (See Comments)    Difficulty waking   Buprenorphine Hcl Nausea And Vomiting    Pt does not know if she has ever had this medication    Past Medical History, Surgical history, Social history, and Family History were reviewed and updated.  Review of Systems: All other 10 point review of systems is negative.   Physical Exam:  vitals were not taken for this visit.   Wt Readings from Last 3 Encounters:  12/30/21 152 lb (68.9 kg)  11/10/21 156 lb (70.8 kg)  07/13/21 157 lb 1.9 oz (71.3 kg)    Ocular: Sclerae unicteric, pupils equal, round and reactive to light Ear-nose-throat: Oropharynx clear, dentition fair Lymphatic: No cervical or supraclavicular adenopathy Lungs no rales or rhonchi, good excursion bilaterally Heart regular rate and rhythm, no murmur appreciated Abd soft, nontender, positive bowel sounds MSK no focal spinal tenderness,  no joint edema Neuro: non-focal, well-oriented, appropriate affect Breasts: Deferred   Lab Results  Component Value Date   WBC 8.4 03/29/2022   HGB 11.2 (L) 03/29/2022   HCT 33.0 (L) 03/29/2022   MCV 93.0 03/29/2022   PLT 231 03/29/2022   Lab Results  Component Value Date   FERRITIN 131 11/10/2021   IRON 56 11/10/2021   TIBC 311 11/10/2021   UIBC 255 11/10/2021   IRONPCTSAT 18 11/10/2021   Lab Results  Component Value Date   RETICCTPCT 2.7 03/29/2022   RBC 3.52 (L) 03/29/2022   RBC 3.55 (  L) 03/29/2022   No results found for: "KPAFRELGTCHN", "LAMBDASER", "[facility]" Lab Results  Component Value Date   IGGSERUM 789 11/15/2016   IGA 150 11/15/2016   IGMSERUM 86 11/15/2016   No results found for: "TOTALPROTELP", "ALBUMINELP", "A1GS", "A2GS", "BETS", "BETA2SER", "GAMS", "MSPIKE", "SPEI"   Chemistry      Component Value Date/Time   NA 136 03/29/2022 1417   NA 139 11/02/2020 1136   NA 144 04/17/2017 1150   NA 141 04/18/2016 1256   K 3.8 03/29/2022 1417   K 4.6 04/17/2017 1150   K 4.4 04/18/2016 1256   CL 100 03/29/2022 1417   CL 109 (H) 04/17/2017 1150   CO2 24 03/29/2022 1417   CO2 23 04/17/2017 1150   CO2 18 (L) 04/18/2016 1256   BUN 23 03/29/2022 1417   BUN 33 (H) 11/02/2020 1136   BUN 28 (H) 04/17/2017 1150   BUN 27.3 (H) 04/18/2016 1256   CREATININE 1.28 (H) 03/29/2022 1417   CREATININE 1.3 (H) 04/17/2017 1150   CREATININE 1.4 (H) 04/18/2016 1256      Component Value Date/Time   CALCIUM 9.3 03/29/2022 1417   CALCIUM 9.6 04/17/2017 1150   CALCIUM 9.4 04/18/2016 1256   ALKPHOS 68 03/29/2022 1417   ALKPHOS 62 04/17/2017 1150   ALKPHOS 68 04/18/2016 1256   AST 20 03/29/2022 1417   AST 14 04/18/2016 1256   ALT 21 03/29/2022 1417   ALT 20 04/17/2017 1150   ALT 23 04/18/2016 1256   BILITOT 0.5 03/29/2022 1417   BILITOT <0.22 04/18/2016 1256       Impression and Plan:  Ms. Mctigue is a very pleasant 66 yo caucasian female with remote history of stage  I ductal carcinoma of the left breast diagnosed in 2003 as well as iron deficiency.   Iron studies are pending.  No ESA needed, Hgb 11.2.  Follow-up in 4 months.   Lottie Dawson, NP 9/26/20233:21 PM

## 2022-03-30 ENCOUNTER — Ambulatory Visit: Payer: Self-pay

## 2022-03-30 LAB — IRON AND IRON BINDING CAPACITY (CC-WL,HP ONLY)
Iron: 59 ug/dL (ref 28–170)
Saturation Ratios: 20 % (ref 10.4–31.8)
TIBC: 297 ug/dL (ref 250–450)
UIBC: 238 ug/dL (ref 148–442)

## 2022-03-30 NOTE — Patient Outreach (Signed)
  Care Coordination   Follow Up Visit Note   03/30/2022 Name: [name] MRN: [id number] DOB: 04-22-1956  [name] is a 66 y.o. year old female who sees [name], [name]., MD for primary care. I spoke with  [name] by phone today.  What matters to the patients health and wellness today?  Patient reports she is doing well.  Reports she is having TMJ pain.      Goals Addressed               This Visit's Progress     I want my heart to be in good rhythm (pt-stated)        Care Coordination Interventions: Reviewed with patient that she has seen many doctors recently and states that her heart is in a normal rhythm. Reviewed importance of taking all medications as prescribed. Reviewed follow up appointments.       I want to lose 15 pounds (pt-stated)        Care Coordination Interventions: Reviewed with patient that her weight is stable. Reviewed that she is sleeping better with melatonin.  Patient reports that she did receive her advanced directives but has not completed them yet.Marland Kitchen          SDOH assessments and interventions completed:  No     Care Coordination Interventions Activated:  Yes  Care Coordination Interventions:  Yes, provided   Follow up plan: Follow up call scheduled for 05/19/2022    Encounter Outcome:  Pt. Visit Completed   Tomasa Rand, RN, BSN, CEN [place] Coordinator [phone]

## 2022-04-10 ENCOUNTER — Other Ambulatory Visit: Payer: Self-pay | Admitting: Cardiology

## 2022-04-11 NOTE — Telephone Encounter (Signed)
Prescription refill request for Eliquis received. Indication: PAF Last office visit: 12/30/21  Nelta Numbers MD Scr: 1.28 on 03/29/22 Age: 66 Weight: 68.9kg  Based on above findings Eliquis '5mg'$  twice daily is the appropriate dose.  Refill approved.

## 2022-05-08 ENCOUNTER — Other Ambulatory Visit: Payer: Self-pay | Admitting: Cardiology

## 2022-05-19 ENCOUNTER — Ambulatory Visit: Payer: Self-pay

## 2022-05-19 NOTE — Patient Outreach (Signed)
  Care Coordination   Follow Up Visit Note   05/19/2022 Name: [name] MRN: [id number] DOB: [birth year]  [name] is a 66 y.o. year old female who sees [name], [name]., MD for primary care. I spoke with  [name] by phone today.  What matters to the patients health and wellness today?  Patient reports that she continues to have TMJ pain. Reports her heart continue to be in a normal rhythm.  States she has lost 5 pounds.  Patient reports she is self managing well at this time and denies need for additional calls.    Goals Addressed               This Visit's Progress     COMPLETED: I want my heart to be in good rhythm (pt-stated)        Care Coordination Interventions: Reports she is in normal rhythm.   Denies any additional needs at this time.       COMPLETED: I want to lose 15 pounds (pt-stated)        Care Coordination Interventions: Patient reports that she has lost 5 pounds.   Denies any additional needs today          SDOH assessments and interventions completed:  No     Care Coordination Interventions Activated:  Yes  Care Coordination Interventions:  Yes, provided   Follow up plan: No further intervention required.    Encounter Outcome:  Pt. Visit Completed   Alexandra Rand, RN, BSN, CEN [place] Coordinator [phone]

## 2022-06-01 ENCOUNTER — Other Ambulatory Visit: Payer: Self-pay | Admitting: Cardiology

## 2022-06-14 ENCOUNTER — Emergency Department (HOSPITAL_COMMUNITY)
Admission: EM | Admit: 2022-06-14 | Discharge: 2022-06-14 | Disposition: A | Discharge status: Home / Self Care | Payer: Medicare Other | Attending: Emergency Medicine | Admitting: Emergency Medicine

## 2022-06-14 ENCOUNTER — Encounter (HOSPITAL_COMMUNITY): Payer: Self-pay

## 2022-06-14 ENCOUNTER — Emergency Department (HOSPITAL_COMMUNITY): Payer: Medicare Other

## 2022-06-14 ENCOUNTER — Other Ambulatory Visit: Payer: Self-pay

## 2022-06-14 DIAGNOSIS — K529 Noninfective gastroenteritis and colitis, unspecified: Secondary | ICD-10-CM | POA: Diagnosis not present

## 2022-06-14 DIAGNOSIS — Z7901 Long term (current) use of anticoagulants: Secondary | ICD-10-CM | POA: Insufficient documentation

## 2022-06-14 DIAGNOSIS — R197 Diarrhea, unspecified: Secondary | ICD-10-CM | POA: Diagnosis present

## 2022-06-14 LAB — URINALYSIS, ROUTINE W REFLEX MICROSCOPIC
Bacteria, UA: NONE SEEN
Bilirubin Urine: NEGATIVE
Glucose, UA: 50 mg/dL — AB
Hgb urine dipstick: NEGATIVE
Ketones, ur: NEGATIVE mg/dL
Nitrite: NEGATIVE
Protein, ur: 30 mg/dL — AB
Specific Gravity, Urine: 1.009 (ref 1.005–1.030)
pH: 6 (ref 5.0–8.0)

## 2022-06-14 LAB — COMPREHENSIVE METABOLIC PANEL
ALT: 18 U/L (ref 0–44)
AST: 18 U/L (ref 15–41)
Albumin: 3.6 g/dL (ref 3.5–5.0)
Alkaline Phosphatase: 59 U/L (ref 38–126)
Anion gap: 11 (ref 5–15)
BUN: 14 mg/dL (ref 8–23)
CO2: 21 mmol/L — ABNORMAL LOW (ref 22–32)
Calcium: 8.3 mg/dL — ABNORMAL LOW (ref 8.9–10.3)
Chloride: 107 mmol/L (ref 98–111)
Creatinine, Ser: 1.04 mg/dL — ABNORMAL HIGH (ref 0.44–1.00)
GFR, Estimated: 59 mL/min — ABNORMAL LOW (ref >60)
Glucose, Bld: 215 mg/dL — ABNORMAL HIGH (ref 70–99)
Potassium: 3.4 mmol/L — ABNORMAL LOW (ref 3.5–5.1)
Sodium: 139 mmol/L (ref 135–145)
Total Bilirubin: 0.5 mg/dL (ref 0.3–1.2)
Total Protein: 6.8 g/dL (ref 6.5–8.1)

## 2022-06-14 LAB — CBC WITH DIFFERENTIAL/PLATELET
Abs Immature Granulocytes: 0.03 10*3/uL (ref 0.00–0.07)
Basophils Absolute: 0 10*3/uL (ref 0.0–0.1)
Basophils Relative: 1 %
Eosinophils Absolute: 0.3 10*3/uL (ref 0.0–0.5)
Eosinophils Relative: 3 %
HCT: 35.4 % — ABNORMAL LOW (ref 36.0–46.0)
Hemoglobin: 11.5 g/dL — ABNORMAL LOW (ref 12.0–15.0)
Immature Granulocytes: 0 %
Lymphocytes Relative: 24 %
Lymphs Abs: 2.1 10*3/uL (ref 0.7–4.0)
MCH: 30.7 pg (ref 26.0–34.0)
MCHC: 32.5 g/dL (ref 30.0–36.0)
MCV: 94.4 fL (ref 80.0–100.0)
Monocytes Absolute: 0.5 10*3/uL (ref 0.1–1.0)
Monocytes Relative: 6 %
Neutro Abs: 5.8 10*3/uL (ref 1.7–7.7)
Neutrophils Relative %: 66 %
Platelets: 265 10*3/uL (ref 150–400)
RBC: 3.75 MIL/uL — ABNORMAL LOW (ref 3.87–5.11)
RDW: 13.7 % (ref 11.5–15.5)
WBC: 8.7 10*3/uL (ref 4.0–10.5)
nRBC: 0 % (ref 0.0–0.2)

## 2022-06-14 LAB — LIPASE, BLOOD: Lipase: 39 U/L (ref 11–51)

## 2022-06-14 MED ORDER — IOHEXOL 300 MG/ML  SOLN
100.0000 mL | Freq: Once | INTRAMUSCULAR | Status: AC | PRN
  Administered 2022-06-14: 100 mL via INTRAVENOUS

## 2022-06-14 NOTE — ED Provider Notes (Signed)
[facility] DEPT Provider Note   CSN: [id number] Arrival date & time: 06/14/22  1725     History  Chief Complaint  Patient presents with   Abdominal Pain    [name] is a 66 y.o. female, c/o intermittent abdominal pain that ranges from severe to mild in naturex3 days. Pain is achy and stabbing at times. Associated N/D. No vomiting, fever, chills, SOB, congestion. Reports 2 episodes of diarrhea/dayx3 days, non-bloody. Reports bloating and gas. Denies any sick contacts or antibiotics.  No recent hospitalizations.    Home Medications Prior to Admission medications   Medication Sig Start Date End Date Taking? Authorizing Provider  acetaminophen (TYLENOL) 500 MG tablet Take 500 mg by mouth every 6 (six) hours as needed for mild pain, headache or fever.    [provider]  albuterol (VENTOLIN HFA) 108 (90 Base) MCG/ACT inhaler Inhale 2 puffs into the lungs every 6 (six) hours as needed for wheezing or shortness of breath.    [provider]  Alogliptin Benzoate 12.5 MG TABS Take 1 tablet by mouth daily. 04/29/21   [provider]  amLODipine (NORVASC) 2.5 MG tablet TAKE 1 TABLET BY MOUTH EVERY DAY 01/28/22   Park Liter, MD  ammonium lactate (LAC-HYDRIN) 12 % lotion Apply 1 application topically daily. 05/25/21   [provider]  Ascorbic Acid (VITAMIN C) 1000 MG tablet Take 1,000 mg by mouth in the morning.    [provider]  atorvastatin (LIPITOR) 10 MG tablet Take 10 mg by mouth at bedtime.    [provider]  carvedilol (COREG) 6.25 MG tablet Take 1 tablet (6.25 mg total) by mouth 2 (two) times daily with a meal. 06/01/22   Park Liter, MD  cholecalciferol (VITAMIN D) 25 MCG (1000 UNIT) tablet Take 1,000 Units by mouth in the morning.    [provider]  ELIQUIS 5 MG TABS tablet TAKE 1 TABLET BY MOUTH TWICE A DAY 04/11/22   Park Liter, MD  fluticasone [facility]) 50  MCG/ACT nasal spray Place 1 spray into both nostrils daily as needed for allergies or rhinitis.    [provider]  furosemide (LASIX) 20 MG tablet Take 20 mg by mouth daily as needed for edema (weight gain of 2 lbs or more x 2 days).    [provider]  glipiZIDE (GLUCOTROL XL) 10 MG 24 hr tablet Take 10 mg by mouth in the morning and at bedtime.  07/11/19   [provider]  JANUVIA 100 MG tablet Take 100 mg by mouth daily. Patient not taking: Reported on 03/03/2022 05/17/21   [provider]  loratadine (CLARITIN) 10 MG tablet Take 10 mg by mouth in the morning.    [provider]  MELATONIN PO Take by mouth at bedtime.    [provider]  montelukast (SINGULAIR) 10 MG tablet Take 10 mg by mouth at bedtime.    [provider]  omeprazole (PRILOSEC) 20 MG capsule Take 20 mg by mouth in the morning. 03/31/13   [provider]  sertraline (ZOLOFT) 50 MG tablet Take 50 mg by mouth in the morning. 10/08/19   [provider]  sodium bicarbonate 650 MG tablet Take 650 mg by mouth 2 (two) times daily. 11/16/20   [provider]  solifenacin (VESICARE) 5 MG tablet Take 5 mg by mouth daily.    [provider]  TRULICITY 6.22 WL/7.9GX SOPN Inject 1.5 mg into the skin once a week.  10/14/21   [provider]  VITAMIN E PO Take 1 capsule by mouth daily. Unknown strength    [provider]  Zinc 50 MG TABS Take 50 mg by mouth in the morning.    [provider]      Allergies    Pneumococcal vaccines, Cleocin [clindamycin hcl], Morphine and related, Other, Shellfish-derived products, Ativan [lorazepam], and Buprenorphine hcl    Review of Systems   Review of Systems  Constitutional:  Negative for chills and fever.  Gastrointestinal:  Positive for abdominal pain, diarrhea and nausea. Negative for vomiting.    Physical Exam Updated Vital Signs BP (!) 145/83   Pulse 87   Temp 98.4 F  (36.9 C) (Oral)   Resp 16   SpO2 100%  Physical Exam Vitals and nursing note reviewed.  Constitutional:      General: She is not in acute distress.    Appearance: She is well-developed.  HENT:     Head: Normocephalic and atraumatic.  Eyes:     Conjunctiva/sclera: Conjunctivae normal.  Cardiovascular:     Rate and Rhythm: Normal rate and regular rhythm.     Heart sounds: No murmur heard. Pulmonary:     Effort: Pulmonary effort is normal. No respiratory distress.     Breath sounds: Normal breath sounds.  Abdominal:     Palpations: Abdomen is soft.     Tenderness: There is generalized abdominal tenderness. There is no guarding or rebound.  Musculoskeletal:        General: No swelling.     Cervical back: Neck supple.  Skin:    General: Skin is warm and dry.     Capillary Refill: Capillary refill takes less than 2 seconds.  Neurological:     Mental Status: She is alert.  Psychiatric:        Mood and Affect: Mood normal.     ED Results / Procedures / Treatments   Labs (all labs ordered are listed, but only abnormal results are displayed) Labs Reviewed  CBC WITH DIFFERENTIAL/PLATELET - Abnormal; Notable for the following components:      Result Value   RBC 3.75 (*)    Hemoglobin 11.5 (*)    HCT 35.4 (*)    All other components within normal limits  COMPREHENSIVE METABOLIC PANEL - Abnormal; Notable for the following components:   Potassium 3.4 (*)    CO2 21 (*)    Glucose, Bld 215 (*)    Creatinine, Ser 1.04 (*)    Calcium 8.3 (*)    GFR, Estimated 59 (*)    All other components within normal limits  URINALYSIS, ROUTINE W REFLEX MICROSCOPIC - Abnormal; Notable for the following components:   Color, Urine STRAW (*)    Glucose, UA 50 (*)    Protein, ur 30 (*)    Leukocytes,Ua TRACE (*)    All other components within normal limits  RESP PANEL BY RT-PCR (RSV, FLU A&B, COVID)  [id number]  LIPASE, BLOOD    EKG None  Radiology CT Abdomen Pelvis W Contrast  Result  Date: 06/14/2022 CLINICAL DATA:  Abdominal pain, acute, nonlocalized. Nausea, diarrhea. EXAM: CT ABDOMEN AND PELVIS WITH CONTRAST TECHNIQUE: Multidetector CT imaging of the abdomen and pelvis was performed using the standard protocol following bolus administration of intravenous contrast. RADIATION DOSE REDUCTION: This exam was performed according to the departmental dose-optimization program which includes automated exposure control, adjustment of the mA and/or kV according to patient size and/or use of iterative reconstruction technique. CONTRAST:  147m OMNIPAQUE IOHEXOL 300 MG/ML  SOLN COMPARISON:  12/05/2020 FINDINGS: Lower chest: Moderate coronary artery calcification. Global cardiac size within normal limits. No pericardial effusion. Visualized lung bases are clear. Hepatobiliary: No focal liver abnormality is seen. No gallstones, gallbladder wall thickening, or biliary dilatation. Pancreas: Unremarkable Spleen: Unremarkable Adrenals/Urinary Tract: The adrenal glands are unremarkable. The kidneys are normal in position. Mild bilateral renal cortical atrophy. No hydronephrosis. No enhancing intrarenal mass. No intrarenal or ureteral calculi. The bladder is unremarkable. Stomach/Bowel: There is marked circumferential bowel wall thickening with submucosal edema, hyperemia, and mesenteric edema involving a relatively long segment of distal Baeleigh Devincent bowel in keeping with infectious or inflammatory enteritis. No evidence of obstruction or perforation. No free intraperitoneal gas. Mild free fluid within the pelvis. The stomach and large bowel are unremarkable. Appendix normal. Vascular/Lymphatic: Retroaortic left renal vein. Moderate aortoiliac atherosclerotic calcification. No aortic aneurysm. No pathologic adenopathy within the abdomen and pelvis. Reproductive: Multiple involuted calcified fibroids within the uterus. The pelvic organs are otherwise unremarkable. Other: No abdominal wall hernia. Musculoskeletal: No  acute bone abnormality. Extensive thoracolumbar spinal fusion with instrumentation is again partially visualized with decompression of L3-L5. No lytic or blastic bone lesion. IMPRESSION: 1. Marked circumferential bowel wall thickening with submucosal edema, hyperemia, and mesenteric edema involving a relatively long segment of distal Kesley Gaffey bowel in keeping with infectious or inflammatory enteritis. No evidence of obstruction or perforation. 2. Moderate coronary artery calcification. 3.  Aortic Atherosclerosis (ICD10-I70.0). Electronically Signed   By: AFidela SalisburyM.D.   On: 06/14/2022 21:04    Procedures Procedures    Medications Ordered in ED Medications  iohexol (OMNIPAQUE) 300 MG/ML solution 100 mL (100 mLs Intravenous Contrast Given 06/14/22 2050)    ED Course/ Medical Decision Making/ A&P                           Medical Decision Making Patient is 66year old female, here for nausea, diarrhea for the last 3 days and associated abdominal pain.  She has no guarding on exam, however given her age we will obtain a CT abdomen pelvis.  And basic blood work to further evaluate.  She is well-appearing, states her pain is minimal at this time, and the pain comes and sharp stabbing bursts.  She is able to eat and drink appropriately.  Amount and/or Complexity of Data Reviewed Labs:     Details: Labs are fairly unremarkable. Radiology:     Details: CT abdomen pelvis shows findings concerning for enteritis, some mesenteric edema. Discussion of management or test interpretation with external provider(s): Patient is well-appearing, CT abdomen pelvis shows concerning for enteritis, some mesenteric edema.  She has no history of Crohn's, ulcerative colitis.  And she is not febrile has not traveled outside of the country as of late.  And has not been any in any hospitals, or had any antibiotics recently.  My suspicion for C. difficile is low given her bowel movements occurring only 2-3 times a day.  We  discussed conservative care, and she declined any Zofran for home.  Return precautions emphasized including bloody stools, severe abdominal pain, inability to drink or eat, or for development of a fever.    Final Clinical Impression(s) / ED Diagnoses Final diagnoses:  Enteritis    Rx / DC Orders ED Discharge Orders     None         Varun Jourdan, BSi Gaul PA 06/14/22 2222    ALacretia Leigh MD 06/18/22 1553

## 2022-06-14 NOTE — ED Triage Notes (Signed)
Pt c/o abdominal pain/nausea/diarrhea x3 days. Pain has increased today. Pt states she went to UC today and was told to come to ED for a CT scan. Pt denies emesis. Pt states last bowel movement today. Pt has been eating and drinking.

## 2022-06-14 NOTE — ED Provider Triage Note (Signed)
Emergency Medicine Provider Triage Evaluation Note  [name] , a 66 y.o. female  was evaluated in triage.  Pt complains of abdominal pain.  Patient reports that on Sunday she developed centralized abdominal pain with nausea and diarrhea.  Patient reports that over the course of the last 2 days the abdominal pain is worsened and is now wrapping around her left side into her flank as well as into her lower abdomen.  Patient denies any urinary symptoms.  Patient does endorse history of kidney stones.  Patient denies any fevers at home, history of abdominal surgeries.  Patient reports last bowel movement was today however was very loose.  Review of Systems  Positive:  Negative:   Physical Exam  BP (!) 151/89 (BP Location: Left Arm)   Pulse 86   Temp 98.6 F (37 C) (Oral)   Resp 16   SpO2 99%  Gen:   Awake, no distress   Resp:  Normal effort  MSK:   Moves extremities without difficulty  Other:  Right-sided abdominal pain on palpation  Medical Decision Making  Medically screening exam initiated at 5:52 PM.  Appropriate orders placed.  [name] was informed that the remainder of the evaluation will be completed by another provider, this initial triage assessment does not replace that evaluation, and the importance of remaining in the ED until their evaluation is complete.     Azucena Cecil, PA-C 06/14/22 1753

## 2022-06-14 NOTE — ED Notes (Addendum)
Pt refused covid swab stated she doesn't have any symptoms, advised the Rn as well

## 2022-06-14 NOTE — Discharge Instructions (Signed)
If you start having 8 plus stools a day, severe nausea, vomiting, or develop a fever please return to the ER.  Your symptoms should improve within the next week.  Make sure you follow-up with your primary care doctor.

## 2022-07-02 ENCOUNTER — Other Ambulatory Visit: Payer: Self-pay | Admitting: Cardiology

## 2022-07-13 ENCOUNTER — Other Ambulatory Visit: Payer: Self-pay | Admitting: Cardiology

## 2022-07-13 DIAGNOSIS — I480 Paroxysmal atrial fibrillation: Secondary | ICD-10-CM

## 2022-07-13 NOTE — Telephone Encounter (Signed)
Prescription refill request for Eliquis received. Indication:Afib  Last office visit: 12/30/21 Agustin Cree)  Scr: 1.04 (06/14/22)  Age: 67 Weight: 68.9kg  Appropriate dose and refill sent to requested pharmacy.

## 2022-07-15 ENCOUNTER — Other Ambulatory Visit (HOSPITAL_COMMUNITY): Payer: Self-pay

## 2022-07-15 MED ORDER — TRULICITY 1.5 MG/0.5ML ~~LOC~~ SOAJ
SUBCUTANEOUS | 5 refills | Status: DC
  Filled 2022-07-15: qty 2, 28d supply, fill #0

## 2022-07-16 ENCOUNTER — Other Ambulatory Visit (HOSPITAL_COMMUNITY): Payer: Self-pay

## 2022-07-16 ENCOUNTER — Encounter: Payer: Self-pay | Admitting: Family

## 2022-07-19 ENCOUNTER — Other Ambulatory Visit (HOSPITAL_COMMUNITY): Payer: Self-pay

## 2022-07-19 MED ORDER — TRULICITY 3 MG/0.5ML ~~LOC~~ SOAJ
SUBCUTANEOUS | 5 refills | Status: DC
  Filled 2022-07-19: qty 2, 30d supply, fill #0
  Filled 2022-08-19: qty 2, 30d supply, fill #1
  Filled 2022-09-22: qty 2, 30d supply, fill #2
  Filled 2022-10-27: qty 2, 30d supply, fill #3
  Filled 2022-12-15: qty 2, 30d supply, fill #4
  Filled 2023-02-07: qty 2, 28d supply, fill #5

## 2022-07-29 ENCOUNTER — Other Ambulatory Visit (HOSPITAL_COMMUNITY): Payer: Self-pay

## 2022-07-29 ENCOUNTER — Encounter: Payer: Self-pay | Admitting: Family

## 2022-07-29 ENCOUNTER — Inpatient Hospital Stay: Payer: Medicare Other | Attending: Family

## 2022-07-29 ENCOUNTER — Inpatient Hospital Stay (HOSPITAL_BASED_OUTPATIENT_CLINIC_OR_DEPARTMENT_OTHER): Payer: Medicare Other | Admitting: Family

## 2022-07-29 ENCOUNTER — Inpatient Hospital Stay: Payer: Medicare Other

## 2022-07-29 VITALS — BP 131/78 | HR 93 | Temp 98.9°F | Resp 18 | Wt 146.1 lb

## 2022-07-29 DIAGNOSIS — D508 Other iron deficiency anemias: Secondary | ICD-10-CM

## 2022-07-29 DIAGNOSIS — N183 Chronic kidney disease, stage 3 unspecified: Secondary | ICD-10-CM

## 2022-07-29 DIAGNOSIS — Z853 Personal history of malignant neoplasm of breast: Secondary | ICD-10-CM | POA: Insufficient documentation

## 2022-07-29 DIAGNOSIS — D509 Iron deficiency anemia, unspecified: Secondary | ICD-10-CM | POA: Diagnosis not present

## 2022-07-29 DIAGNOSIS — Z08 Encounter for follow-up examination after completed treatment for malignant neoplasm: Secondary | ICD-10-CM | POA: Diagnosis present

## 2022-07-29 DIAGNOSIS — D631 Anemia in chronic kidney disease: Secondary | ICD-10-CM | POA: Diagnosis not present

## 2022-07-29 LAB — CBC WITH DIFFERENTIAL (CANCER CENTER ONLY)
Abs Immature Granulocytes: 0.12 10*3/uL — ABNORMAL HIGH (ref 0.00–0.07)
Basophils Absolute: 0.1 10*3/uL (ref 0.0–0.1)
Basophils Relative: 1 %
Eosinophils Absolute: 0.2 10*3/uL (ref 0.0–0.5)
Eosinophils Relative: 2 %
HCT: 37.6 % (ref 36.0–46.0)
Hemoglobin: 12.2 g/dL (ref 12.0–15.0)
Immature Granulocytes: 1 %
Lymphocytes Relative: 22 %
Lymphs Abs: 2 10*3/uL (ref 0.7–4.0)
MCH: 29.9 pg (ref 26.0–34.0)
MCHC: 32.4 g/dL (ref 30.0–36.0)
MCV: 92.2 fL (ref 80.0–100.0)
Monocytes Absolute: 0.5 10*3/uL (ref 0.1–1.0)
Monocytes Relative: 6 %
Neutro Abs: 6.2 10*3/uL (ref 1.7–7.7)
Neutrophils Relative %: 68 %
Platelet Count: 257 10*3/uL (ref 150–400)
RBC: 4.08 MIL/uL (ref 3.87–5.11)
RDW: 13.9 % (ref 11.5–15.5)
WBC Count: 9 10*3/uL (ref 4.0–10.5)
nRBC: 0 % (ref 0.0–0.2)

## 2022-07-29 LAB — CMP (CANCER CENTER ONLY)
ALT: 15 U/L (ref 0–44)
AST: 13 U/L — ABNORMAL LOW (ref 15–41)
Albumin: 4.6 g/dL (ref 3.5–5.0)
Alkaline Phosphatase: 70 U/L (ref 38–126)
Anion gap: 12 (ref 5–15)
BUN: 24 mg/dL — ABNORMAL HIGH (ref 8–23)
CO2: 26 mmol/L (ref 22–32)
Calcium: 9.8 mg/dL (ref 8.9–10.3)
Chloride: 103 mmol/L (ref 98–111)
Creatinine: 1.2 mg/dL — ABNORMAL HIGH (ref 0.44–1.00)
GFR, Estimated: 50 mL/min — ABNORMAL LOW (ref >60)
Glucose, Bld: 249 mg/dL — ABNORMAL HIGH (ref 70–99)
Potassium: 4 mmol/L (ref 3.5–5.1)
Sodium: 141 mmol/L (ref 135–145)
Total Bilirubin: 0.4 mg/dL (ref 0.3–1.2)
Total Protein: 7.7 g/dL (ref 6.5–8.1)

## 2022-07-29 LAB — RETICULOCYTES
Immature Retic Fract: 11.1 % (ref 2.3–15.9)
RBC.: 4.13 MIL/uL (ref 3.87–5.11)
Retic Count, Absolute: 89.2 10*3/uL (ref 19.0–186.0)
Retic Ct Pct: 2.2 % (ref 0.4–3.1)

## 2022-07-29 LAB — IRON AND IRON BINDING CAPACITY (CC-WL,HP ONLY)
Iron: 79 ug/dL (ref 28–170)
Saturation Ratios: 25 % (ref 10.4–31.8)
TIBC: 311 ug/dL (ref 250–450)
UIBC: 232 ug/dL (ref 148–442)

## 2022-07-29 LAB — FERRITIN: Ferritin: 180 ng/mL (ref 11–307)

## 2022-07-29 NOTE — Progress Notes (Signed)
Hematology and Oncology Follow Up Visit  [name] [id number] 1955/09/04 67 y.o. 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 Hgb today is up to 12.2! She has mild fatigue, SOB with exertion and occasional palpitations. She states that she has an appointment coming up with cardiology for further evaluation.  No blood loss noted. No bruising or petechiae.  No fever, chills, n/v, cough, rash, dizziness, chest pain, abdominal pain or changes in bowel or bladder habits.  No swelling, tenderness, numbness or tingling in her extremities at this time.  No falls or syncope.  Appetite and hydration are good. Weight is stable at 146 lbs.   ECOG Performance Status: 1 - Symptomatic but completely ambulatory  Medications:  Allergies as of 07/29/2022       Reactions   Pneumococcal Vaccines Other (See Comments)   Really high fever, and flu symptoms. MD instructed not to give   Cleocin [clindamycin Hcl] Other (See Comments)   "irritated my esophagus"   Morphine And Related Nausea And Vomiting   Other Nausea And Vomiting, Swelling   Shrimp if eat a lot   Shellfish-derived Products Other (See Comments)   Patient stated she is allergic to "shrimp," if she eats "a lot"   Ativan [lorazepam] Other (See Comments)   Difficulty waking   Buprenorphine Hcl Nausea And Vomiting   Pt does not know if she has ever had this medication        Medication List        Accurate as of July 29, 2022  1:34 PM. If you have any questions, ask your nurse or doctor.          acetaminophen 500 MG tablet Commonly known as: TYLENOL Take 500 mg by mouth every 6 (six) hours as needed for mild pain, headache  or fever.   albuterol 108 (90 Base) MCG/ACT inhaler Commonly known as: VENTOLIN HFA Inhale 2 puffs into the lungs every 6 (six) hours as needed for wheezing or shortness of breath.   Alogliptin Benzoate 12.5 MG Tabs Take 1 tablet by mouth daily.   amLODipine 2.5 MG tablet Commonly known as: NORVASC TAKE 1 TABLET BY MOUTH EVERY DAY   ammonium lactate 12 % lotion Commonly known as: LAC-HYDRIN Apply 1 application topically daily.   atorvastatin 10 MG tablet Commonly known as: LIPITOR Take 10 mg by mouth at bedtime.   carvedilol 6.25 MG tablet Commonly known as: COREG Take 1 tablet (6.25 mg total) by mouth 2 (two) times daily with a meal.   cholecalciferol 25 MCG (1000 UNIT) tablet Commonly known as: VITAMIN D3 Take 1,000 Units by mouth in the morning.   Eliquis 5 MG Tabs tablet Generic drug: apixaban TAKE 1 TABLET BY MOUTH TWICE A DAY   fluticasone 50 MCG/ACT nasal spray Commonly known as: FLONASE Place 1 spray into both nostrils daily as needed for allergies or rhinitis.   furosemide 20 MG tablet Commonly known as: LASIX Take 20 mg by mouth daily as needed for edema (weight gain of 2 lbs or more x 2 days).   glipiZIDE 10 MG 24 hr tablet Commonly known as: GLUCOTROL XL Take 10 mg by mouth in  the morning and at bedtime.   Januvia 100 MG tablet Generic drug: sitaGLIPtin Take 100 mg by mouth daily.   loratadine 10 MG tablet Commonly known as: CLARITIN Take 10 mg by mouth in the morning.   MELATONIN PO Take by mouth at bedtime.   montelukast 10 MG tablet Commonly known as: SINGULAIR Take 10 mg by mouth at bedtime.   omeprazole 20 MG capsule Commonly known as: PRILOSEC Take 20 mg by mouth in the morning.   sertraline 50 MG tablet Commonly known as: ZOLOFT Take 50 mg by mouth in the morning.   sodium bicarbonate 650 MG tablet Take 650 mg by mouth 2 (two) times daily.   solifenacin 5 MG tablet Commonly known as: VESICARE Take 5 mg by mouth daily.    Trulicity 4.69 GE/9.5MW Sopn Generic drug: Dulaglutide Inject 1.5 mg into the skin once a week.   Trulicity 1.5 UX/3.2GM Sopn Generic drug: Dulaglutide Inject 1.5 mg under the skin once weekly   Trulicity 3 WN/0.2VO Sopn Generic drug: Dulaglutide Inject 3 mg under the skin once a week.   vitamin C 1000 MG tablet Take 1,000 mg by mouth in the morning.   VITAMIN E PO Take 1 capsule by mouth daily. Unknown strength   Zinc 50 MG Tabs Take 50 mg by mouth in the morning.        Allergies:  Allergies  Allergen Reactions   Pneumococcal Vaccines Other (See Comments)    Really high fever, and flu symptoms. MD instructed not to give   Cleocin [Clindamycin Hcl] Other (See Comments)    "irritated my esophagus"   Morphine And Related Nausea And Vomiting   Other Nausea And Vomiting and Swelling    Shrimp if eat a lot   Shellfish-Derived Products Other (See Comments)    Patient stated she is allergic to "shrimp," if she eats "a lot"   Ativan [Lorazepam] Other (See Comments)    Difficulty waking   Buprenorphine Hcl Nausea And Vomiting    Pt does not know if she has ever had this medication    Past Medical History, Surgical history, Social history, and Family History were reviewed and updated.  Review of Systems: All other 10 point review of systems is negative.   Physical Exam:  weight is 146 lb 1.9 oz (66.3 kg). Her oral temperature is 98.9 F (37.2 C). Her blood pressure is 130/93 (abnormal) and her pulse is 104 (abnormal). Her respiration is 18 and oxygen saturation is 99%.   Wt Readings from Last 3 Encounters:  07/29/22 146 lb 1.9 oz (66.3 kg)  12/30/21 152 lb (68.9 kg)  11/10/21 156 lb (70.8 kg)    Ocular: Sclerae unicteric, pupils equal, round and reactive to light Ear-nose-throat: Oropharynx clear, dentition fair Lymphatic: No cervical or supraclavicular adenopathy Lungs no rales or rhonchi, good excursion bilaterally Heart regular rate and rhythm, no murmur  appreciated Abd soft, nontender, positive bowel sounds MSK no focal spinal tenderness, no joint edema Neuro: non-focal, well-oriented, appropriate affect Breasts: Deferred   Lab Results  Component Value Date   WBC 9.0 07/29/2022   HGB 12.2 07/29/2022   HCT 37.6 07/29/2022   MCV 92.2 07/29/2022   PLT 257 07/29/2022   Lab Results  Component Value Date   FERRITIN 170 03/29/2022   IRON 59 03/29/2022   TIBC 297 03/29/2022   UIBC 238 03/29/2022   IRONPCTSAT 20 03/29/2022   Lab Results  Component Value Date   RETICCTPCT 2.2 07/29/2022   RBC 4.13  07/29/2022   RBC 4.08 07/29/2022   No results found for: "KPAFRELGTCHN", "LAMBDASER", "[facility]" Lab Results  Component Value Date   IGGSERUM 789 11/15/2016   IGA 150 11/15/2016   IGMSERUM 86 11/15/2016   No results found for: "TOTALPROTELP", "ALBUMINELP", "A1GS", "A2GS", "BETS", "BETA2SER", "GAMS", "MSPIKE", "SPEI"   Chemistry      Component Value Date/Time   NA 139 06/14/2022 1815   NA 139 11/02/2020 1136   NA 144 04/17/2017 1150   NA 141 04/18/2016 1256   K 3.4 (L) 06/14/2022 1815   K 4.6 04/17/2017 1150   K 4.4 04/18/2016 1256   CL 107 06/14/2022 1815   CL 109 (H) 04/17/2017 1150   CO2 21 (L) 06/14/2022 1815   CO2 23 04/17/2017 1150   CO2 18 (L) 04/18/2016 1256   BUN 14 06/14/2022 1815   BUN 33 (H) 11/02/2020 1136   BUN 28 (H) 04/17/2017 1150   BUN 27.3 (H) 04/18/2016 1256   CREATININE 1.04 (H) 06/14/2022 1815   CREATININE 1.28 (H) 03/29/2022 1417   CREATININE 1.3 (H) 04/17/2017 1150   CREATININE 1.4 (H) 04/18/2016 1256      Component Value Date/Time   CALCIUM 8.3 (L) 06/14/2022 1815   CALCIUM 9.6 04/17/2017 1150   CALCIUM 9.4 04/18/2016 1256   ALKPHOS 59 06/14/2022 1815   ALKPHOS 62 04/17/2017 1150   ALKPHOS 68 04/18/2016 1256   AST 18 06/14/2022 1815   AST 20 03/29/2022 1417   AST 14 04/18/2016 1256   ALT 18 06/14/2022 1815   ALT 21 03/29/2022 1417   ALT 20 04/17/2017 1150   ALT 23 04/18/2016 1256    BILITOT 0.5 06/14/2022 1815   BILITOT 0.5 03/29/2022 1417   BILITOT <0.22 04/18/2016 1256       Impression and Plan: Ms. Fodera is a very pleasant 67 yo caucasian female with remote history of stage I ductal carcinoma of the left breast diagnosed in 2003 as well as iron deficiency.  Iron studies are pending.  No ESA needed, Hgb 12.2.  Follow-up in 4 months.   Lottie Dawson, NP 1/26/20241:34 PM

## 2022-08-03 ENCOUNTER — Ambulatory Visit: Payer: Medicare Other | Attending: Cardiology | Admitting: Cardiology

## 2022-08-04 ENCOUNTER — Encounter: Payer: Self-pay | Admitting: Cardiology

## 2022-08-11 ENCOUNTER — Encounter (HOSPITAL_COMMUNITY): Payer: Self-pay | Admitting: *Deleted

## 2022-08-11 ENCOUNTER — Other Ambulatory Visit: Payer: Self-pay | Admitting: Hematology & Oncology

## 2022-08-11 DIAGNOSIS — Z1231 Encounter for screening mammogram for malignant neoplasm of breast: Secondary | ICD-10-CM

## 2022-08-19 ENCOUNTER — Other Ambulatory Visit (HOSPITAL_COMMUNITY): Payer: Self-pay

## 2022-08-30 ENCOUNTER — Encounter: Payer: Self-pay | Admitting: Gastroenterology

## 2022-09-02 ENCOUNTER — Ambulatory Visit: Payer: Medicare Other | Admitting: Gastroenterology

## 2022-09-08 ENCOUNTER — Encounter: Payer: Self-pay | Admitting: Cardiology

## 2022-09-08 ENCOUNTER — Ambulatory Visit: Payer: Medicare Other | Attending: Cardiology | Admitting: Cardiology

## 2022-09-08 VITALS — BP 110/72 | HR 85 | Ht 59.0 in | Wt 143.0 lb

## 2022-09-08 DIAGNOSIS — I420 Dilated cardiomyopathy: Secondary | ICD-10-CM | POA: Insufficient documentation

## 2022-09-08 DIAGNOSIS — I480 Paroxysmal atrial fibrillation: Secondary | ICD-10-CM | POA: Diagnosis present

## 2022-09-08 DIAGNOSIS — I340 Nonrheumatic mitral (valve) insufficiency: Secondary | ICD-10-CM | POA: Insufficient documentation

## 2022-09-08 DIAGNOSIS — I5033 Acute on chronic diastolic (congestive) heart failure: Secondary | ICD-10-CM | POA: Diagnosis present

## 2022-09-08 DIAGNOSIS — D6869 Other thrombophilia: Secondary | ICD-10-CM | POA: Insufficient documentation

## 2022-09-08 NOTE — Progress Notes (Signed)
Cardiology Office Note:    Date:  09/08/2022   ID:  [name], [name] 07/09/1955, MRN [id number]  PCP:  Enid Skeens., MD  Cardiologist:  Jenne Campus, MD    Referring MD: Enid Skeens., MD   Chief Complaint  Patient presents with   Follow-up  Doing fair  History of Present Illness:    [name] is a 67 y.o. female  with past medical history significant for paroxysmal atrial fibrillation, her CHADS2 Vascor equals 4, she is anticoagulated, status post atrial fibrillation ablation done in May 25, [id number] sadly complicated with retroperitoneal bleed luckily we were able to manage this conservatively, after that she also had episode of atrial fibrillation while she was anemic. She was put temporarily on amiodarone now off . Also history of cardiomyopathy with mild to moderate mitral regurgitation, last echocardiogram done in December 2022 showed low normal ejection fraction, essential hypertension, diabetes, dyslipidemia.  Comes today to months for follow-up.  She complained of being dizzy and exhausted.  She is taking care of her 3 grandchildren does have adopted children and they do have some issues she is very stressed out about it.  She sleeps only few hours a day or at night rather, complaining of having some tachycardia however every single time rhythm was checked and sinus tachycardia.  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 difficile  colitis    Calculus of kidney 08/12/2012   CHF (congestive heart failure) ([place])    "mild" (05/15/2013)   CHF (congestive heart failure) ([place])    "mild" (05/15/2013)   Chronic bronchitis (HCC)    Chronic lower back pain    Chronic non-seasonal allergic rhinitis 11/15/2016   Chronic sphenoidal sinusitis [id number]   Chronic systolic congestive heart failure (HCC)    Chronic UTI    Cough variant asthma vs uacs  07/24/2017   Onset in her 20s some better p rx with allergy shots in her 30s Singulair trial 11/15/16 >  Improved 01/18/17:  FEV1 1.29 L (61%)  Ratio 75  Still on ACEi as of 07/24/2017  Spirometry 06/28/18   FEV1 1.3 (61%)  Ratio 77 with min curvature   - Allergy profile 07/26/2018 >  Eos 1.2 /  IgE  420  RAST Pos mold only    Cough variant asthma vs uacs  07/24/2017   Onset in her 20s some better p rx with allergy shots in her 30s Singulair trial 11/15/16 >  Improved 01/18/17:  FEV1 1.29 L (61%)  Ratio 75  Still on ACEi as of 07/24/2017  Spirometry 06/28/18   FEV1 1.3 (61%)  Ratio 77 with min curvature   - Allergy profile 07/26/2018 >  Eos 1.2 /  IgE  420  RAST Pos mold only    Diabetes mellitus type 2 in obese ([place])    Diarrhea 11/15/2016   Dilated cardiomyopathy ([place]) 07/16/2019   DOE (dyspnea on exertion) 07/27/2018   Onset  early 2019 assoc with uacs while on ACEi - 06/28/18    Walked RA x one lap =  approx 250 ft - stopped due to sob with sats still high 90's      Dysphagia    Dysrhythmia    atrial fib/dr [facility]   Family history of anesthesia complication    "my mother also had PONV" (05/15/2013)   Fever 10/29/2017   Foot pain 04/09/2018   GERD (gastroesophageal reflux disease)    Heart murmur    High cholesterol    History of breast cancer in female 07/19/2013   HTN (hypertension) 05/08/2013   Hydronephrosis of right kidney    Hyperlipidemia 11/15/2016   Hypomagnesemia    Hyponatremia 10/29/2017   IDA (iron deficiency anemia) 04/19/2017   Infection     Kidney stones    Left breast mass 07/19/2013   Low back pain 05/01/2018   Migraine    "haven't had one in the early 2000's" (05/15/2013)   Mitral regurgitation 09/03/2020   Mixed incontinence 07/24/2018   Nephrolithiasis 11/15/2016   Other chronic cystitis 05/19/2011   PAF (paroxysmal atrial fibrillation) ([place])    Personal history of chemotherapy    Personal history of radiation therapy    Pneumonia    "used to be chronic; last time I had it was 2013" (05/15/2013)   PONV (postoperative nausea and vomiting)    Recurrent sinusitis 09/08/2016   Recurrent UTI 11/20/2014   Recurrent UTI (urinary tract infection)    "from the continuous kidney stones; take Macrodantin qd" (05/15/2013)   Referred otalgia of left ear 04/22/2021   Respiratory failure requiring intubation ([place]) 05/08/2013   Restrictive lung disease 01/18/2017   Retroperitoneal bleed 12/05/2020   Sacroiliac joint pain 05/16/2018   Sepsis ([place]) 2005   kidney stone infection   Septic shock ([place]) 09/23/2018   Simple chronic bronchitis ([place]) 11/15/2016   Strep throat 10/29/2017   Tachypnea    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   Tension headache    Type II diabetes mellitus ([place])    Type II or unspecified type diabetes mellitus without mention of complication, not stated as uncontrolled 05/11/2013   Urinary tract stones 05/19/2011   Urinary urgency 07/24/2018    Past Surgical History:  Procedure Laterality Date   ATRIAL FIBRILLATION ABLATION N/A 11/25/2020   Procedure: ATRIAL FIBRILLATION ABLATION;  Surgeon: Constance Haw, MD;  Location: [facility];  Service: Cardiovascular;  Laterality: N/A;   BILATERAL OOPHORECTOMY Bilateral 2006   "cause I needed to 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" (05/15/2013)   BREAST BIOPSY Left 02/2001   BREAST LUMPECTOMY Left 02/2001   BREAST LUMPECTOMY Left 09/23/2013   Procedure: LEFT LUMPECTOMY WITH SPECIMEN MAMMOGRAM;  Surgeon: Adin Hector,  MD;  Location: [facility];  Service: General;  Laterality: Left;   BUBBLE STUDY  11/24/2020   Procedure: BUBBLE STUDY;  Surgeon: Geralynn Rile, MD;  Location: [facility];  Service: Cardiovascular;;   COLONOSCOPY  05/16/2014   Colonic polyp status post polypectomy. Minimal sigmoid diverticulosis. Small internal hemorrhoids. Otherwise normal colonoscopy to terminal ileum.   DIAGNOSTIC LAPAROSCOPY     cyst-near ovary   ESOPHAGOGASTRODUODENOSCOPY  11/25/2016   Gastric polyp(s) Otherwise normal duoedenoscopy examination   LITHOTRIPSY     "2-3 times prior to 2002" (05/15/2013)   SPINAL FUSION N/A 05/08/2013   Procedure: T9-S1 INSTRUMENTED FUSION T12 -S1 DECOMPRESSION;  Surgeon: Melina Schools, MD;  Location: [facility];  Service: Orthopedics;  Laterality: N/A;   TEE WITHOUT CARDIOVERSION N/A 11/06/2020   Procedure: TRANSESOPHAGEAL ECHOCARDIOGRAM (TEE);  Surgeon: Fay Records, MD;  Location: [facility];  Service: Cardiovascular;  Laterality: N/A;   TEE WITHOUT CARDIOVERSION N/A 11/24/2020   Procedure: TRANSESOPHAGEAL ECHOCARDIOGRAM (TEE);  Surgeon: Geralynn Rile, MD;  Location: [facility];  Service: Cardiovascular;  Laterality: N/A;   TUBAL LIGATION Bilateral 1985    Current Medications: Current Meds  Medication Sig   acetaminophen (TYLENOL) 500 MG tablet Take 500 mg by mouth every 6 (six) hours as needed for mild pain, headache or fever.   albuterol (VENTOLIN HFA) 108 (90 Base) MCG/ACT inhaler Inhale 2 puffs into the lungs every 6 (six) hours as needed for wheezing or shortness of breath.   Alogliptin Benzoate 12.5 MG TABS Take 1 tablet by mouth daily.   amLODipine (NORVASC) 2.5 MG tablet TAKE 1 TABLET BY MOUTH EVERY DAY   ammonium lactate (LAC-HYDRIN) 12 % lotion Apply 1 application topically daily.   Ascorbic Acid (VITAMIN C) 1000 MG tablet Take 1,000 mg by mouth in the morning.   atorvastatin (LIPITOR) 10 MG tablet Take 10 mg by mouth at bedtime.    carvedilol (COREG) 6.25 MG tablet Take 1 tablet (6.25 mg total) by mouth 2 (two) times daily with a meal.   cholecalciferol (VITAMIN D) 25 MCG (1000 UNIT) tablet Take 1,000 Units by mouth in the morning.   ELIQUIS 5 MG TABS tablet TAKE 1 TABLET BY MOUTH TWICE A DAY   fluticasone (FLONASE) 50 MCG/ACT nasal spray Place 1 spray into both nostrils daily as needed for allergies or rhinitis.   furosemide (LASIX) 20 MG tablet Take 20 mg by mouth daily as needed for edema (weight gain of 2 lbs or more x 2 days).   glipiZIDE (GLUCOTROL XL) 10 MG 24 hr tablet Take 10 mg by mouth in the morning and at bedtime.    loratadine (CLARITIN) 10 MG tablet Take 10 mg by mouth in the morning.   MELATONIN PO Take by mouth at bedtime.   montelukast (SINGULAIR) 10 MG tablet Take 10 mg by mouth at bedtime.   omeprazole (PRILOSEC) 20 MG capsule Take 20 mg by mouth in the morning.   sertraline (ZOLOFT) 50 MG tablet Take 50 mg by mouth in the morning.   sodium bicarbonate 650 MG tablet Take 650 mg by mouth 2 (two) times daily.   TRULICITY 3 0000000 SOPN Inject 3 mg under the skin once a week.   VITAMIN E PO Take 1 capsule by mouth daily. Unknown strength   Zinc 50 MG TABS Take 50 mg by mouth in the morning.     Allergies:   Pneumococcal vaccines, Cleocin [clindamycin hcl], Morphine and related, Other, Shellfish-derived products, Ativan [lorazepam], and Buprenorphine hcl   Social History   Socioeconomic History   Marital status: Married    Spouse name: Not on file   Number of children: Not on file   Years of education: Not on file   Highest education level: Not on file  Occupational History   Not on file  Tobacco Use   Smoking status: Never   Smokeless tobacco: Never   Tobacco comments:    Mother & Father smoked  Vaping Use   Vaping Use: Never used  Substance and Sexual Activity   Alcohol use: No    Alcohol/week: 0.0 standard drinks of alcohol   Drug use: No   Sexual activity: Yes  Other Topics Concern    Not on file  Social History Narrative   [facility] (11/15/16):   Patient's originally from [place]. Has always lived in [place]. Currently has an outside dog. Remote exposure to [place] when her children were young. No known mold exposure. 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    Social Determinants of Health   Financial Resource Strain: Not on file  Food Insecurity: No Food Insecurity (03/03/2022)   Hunger Vital Sign    Worried About Running Out of Food in the Last Year: Never true    Ran Out of Food in the Last Year: Never true  Transportation Needs: No Transportation Needs (03/03/2022)   PRAPARE - Hydrologist (Medical): No    Lack of Transportation (Non-Medical): No  Physical Activity: Not on file  Stress: Not on file  Social Connections: Not on file     Family History: The patient's family history includes Breast cancer in her mother; COPD in her mother; Environmental Allergies in her son and another family member; Heart disease in her mother; Kidney cancer in her father; Liver cancer in her brother; Lupus in an other family member. ROS:   Please see the history of present illness.    All 14 point review of systems negative except as described per history of present illness  EKGs/Labs/Other Studies Reviewed:      Recent Labs: 07/29/2022: ALT 15; BUN 24; Creatinine 1.20; Hemoglobin 12.2; Platelet Count 257; Potassium 4.0; Sodium 141  Recent Lipid Panel    Component Value Date/Time   TRIG 155 (H) 05/08/2013 2023    Physical Exam:    VS:  BP 110/72 (BP Location: Left Arm, Patient Position: Sitting, Cuff Size: Normal)   Pulse 85   Ht '4\' 11"'$  (1.499 m)   Wt 143 lb (64.9 kg)   SpO2 95%   BMI 28.88 kg/m     Wt Readings from Last 3 Encounters:  09/08/22 143 lb (64.9 kg)  07/29/22 146 lb 1.9 oz (66.3 kg)  12/30/21 152 lb (68.9 kg)     GEN:  Well nourished, well developed in  no acute distress HEENT: Normal NECK: No JVD; No carotid bruits LYMPHATICS: No lymphadenopathy CARDIAC: RRR, no murmurs, no rubs, no gallops RESPIRATORY:  Clear to auscultation without rales, wheezing or rhonchi  ABDOMEN: Soft, non-tender, non-distended MUSCULOSKELETAL:  No edema; No deformity  SKIN: Warm and dry LOWER EXTREMITIES: no swelling NEUROLOGIC:  Alert and oriented x 3 PSYCHIATRIC:  Normal affect   ASSESSMENT:    1. Dilated cardiomyopathy ([place])   2. Acute on chronic diastolic congestive heart failure ([place])   3. Acquired thrombophilia ([place])   4. PAF (paroxysmal atrial fibrillation) ([place])   5. Nonrheumatic mitral valve regurgitation    PLAN:    In order of problems listed above:  Dilated cardiomyopathy will repeat echocardiogram to recheck left ventricle ejection fraction. Paroxysmal atrial fibrillation, status post atrial fibrillation ablation.  Stable look like maintain sinus rhythm, will continue present management, will continue anticoagulation. Acquired thrombophilia related to Eliquis which we will continue. Nonrheumatic mitral valve regurgitation again echocardiogram be done to recheck. Dyslipidemia I did review K PN which show me LDL 95 HDL 24.  He is on Lipitor 10 which I will continue. Essential hypertension blood pressure well-controlled continue present management   Medication Adjustments/Labs and Tests Ordered: Current medicines are reviewed at length with the patient today.  Concerns regarding medicines are outlined above.  No orders of the defined types were placed in  this encounter.  Medication changes: No orders of the defined types were placed in this encounter.   Signed, Park Liter, MD, [facility] 09/08/2022 9:00 AM    [place]

## 2022-09-08 NOTE — Patient Instructions (Signed)

## 2022-09-08 NOTE — Addendum Note (Signed)
Addended by: Jacobo Forest D on: 09/08/2022 09:04 AM   Modules accepted: Orders

## 2022-09-15 ENCOUNTER — Ambulatory Visit: Payer: Medicare Other | Attending: Cardiology

## 2022-09-15 DIAGNOSIS — I340 Nonrheumatic mitral (valve) insufficiency: Secondary | ICD-10-CM | POA: Diagnosis present

## 2022-09-15 DIAGNOSIS — I420 Dilated cardiomyopathy: Secondary | ICD-10-CM | POA: Diagnosis present

## 2022-09-15 LAB — ECHOCARDIOGRAM COMPLETE
Area-P 1/2: 3.89 cm2
MV M vel: 5.47 m/s
MV Peak grad: 119.7 mmHg
Radius: 0.45 cm
S' Lateral: 3.8 cm

## 2022-09-19 ENCOUNTER — Telehealth: Payer: Self-pay

## 2022-09-19 DIAGNOSIS — I10 Essential (primary) hypertension: Secondary | ICD-10-CM

## 2022-09-19 NOTE — Telephone Encounter (Signed)
Spoke with pt about ECHO results, Per Dr. Wendy Poet note. Pt agreed and verbalized understanding and will come for BMP. Routed to PCP.

## 2022-09-23 ENCOUNTER — Other Ambulatory Visit (HOSPITAL_COMMUNITY): Payer: Self-pay

## 2022-09-27 ENCOUNTER — Ambulatory Visit
Admission: RE | Admit: 2022-09-27 | Discharge: 2022-09-27 | Disposition: A | Discharge status: Home / Self Care | Payer: Medicare Other | Source: Ambulatory Visit | Attending: Hematology & Oncology | Admitting: Hematology & Oncology

## 2022-09-27 DIAGNOSIS — Z1231 Encounter for screening mammogram for malignant neoplasm of breast: Secondary | ICD-10-CM

## 2022-09-28 LAB — BASIC METABOLIC PANEL
BUN/Creatinine Ratio: 19 (ref 12–28)
BUN: 20 mg/dL (ref 8–27)
CO2: 18 mmol/L — ABNORMAL LOW (ref 20–29)
Calcium: 8.7 mg/dL (ref 8.7–10.3)
Chloride: 102 mmol/L (ref 96–106)
Creatinine, Ser: 1.08 mg/dL — ABNORMAL HIGH (ref 0.57–1.00)
Glucose: 162 mg/dL — ABNORMAL HIGH (ref 70–99)
Potassium: 3.6 mmol/L (ref 3.5–5.2)
Sodium: 144 mmol/L (ref 134–144)
eGFR: 56 mL/min/{1.73_m2} — ABNORMAL LOW (ref >59)

## 2022-09-28 LAB — SPECIMEN STATUS REPORT

## 2022-10-04 ENCOUNTER — Telehealth: Payer: Self-pay

## 2022-10-04 ENCOUNTER — Other Ambulatory Visit: Payer: Self-pay | Admitting: Cardiology

## 2022-10-04 DIAGNOSIS — I10 Essential (primary) hypertension: Secondary | ICD-10-CM

## 2022-10-04 MED ORDER — ENTRESTO 24-26 MG PO TABS: 1.0000 | ORAL_TABLET | Freq: Two times a day (BID) | ORAL | 3 refills | Status: DC

## 2022-10-04 NOTE — Telephone Encounter (Signed)
Lab Results reviewed with pt as per Dr. Wendy Poet note.Agreed to start Entresto 24-26 1 twice daily. Called to [facility].  Pt verbalized understanding and had no additional questions. Routed to PCP

## 2022-10-04 NOTE — Addendum Note (Signed)
Addended by: Jacobo Forest D on: 10/04/2022 10:05 AM   Modules accepted: Orders

## 2022-10-04 NOTE — Telephone Encounter (Signed)
-----   Message from Park Liter, MD sent at 09/30/2022 12:43 PM EDT ----- Please start Delene Loll 24-26 twice daily, Chem-7 need to be done within 5 to 7 days

## 2022-10-15 LAB — BASIC METABOLIC PANEL
BUN/Creatinine Ratio: 20 (ref 12–28)
BUN: 24 mg/dL (ref 8–27)
CO2: 19 mmol/L — ABNORMAL LOW (ref 20–29)
Calcium: 9.3 mg/dL (ref 8.7–10.3)
Chloride: 104 mmol/L (ref 96–106)
Creatinine, Ser: 1.21 mg/dL — ABNORMAL HIGH (ref 0.57–1.00)
Glucose: 187 mg/dL — ABNORMAL HIGH (ref 70–99)
Potassium: 4.5 mmol/L (ref 3.5–5.2)
Sodium: 143 mmol/L (ref 134–144)
eGFR: 49 mL/min/{1.73_m2} — ABNORMAL LOW (ref >59)

## 2022-10-19 ENCOUNTER — Telehealth: Payer: Self-pay

## 2022-10-19 NOTE — Telephone Encounter (Signed)
Left message on My Chart with lab results per Dr. Krasowski's note. Routed to PCP.  

## 2022-10-25 ENCOUNTER — Ambulatory Visit: Payer: Medicare Other | Admitting: Gastroenterology

## 2022-11-01 ENCOUNTER — Other Ambulatory Visit (HOSPITAL_COMMUNITY): Payer: Self-pay

## 2022-11-21 ENCOUNTER — Other Ambulatory Visit (HOSPITAL_COMMUNITY): Payer: Self-pay

## 2022-11-21 MED ORDER — TRULICITY 3 MG/0.5ML ~~LOC~~ SOAJ
3.0000 mg | SUBCUTANEOUS | 5 refills | Status: DC
  Filled 2022-11-21: qty 2, 28d supply, fill #0

## 2022-11-25 ENCOUNTER — Inpatient Hospital Stay: Payer: Medicare Other | Attending: Hematology & Oncology

## 2022-11-25 ENCOUNTER — Inpatient Hospital Stay: Payer: Medicare Other

## 2022-11-25 ENCOUNTER — Inpatient Hospital Stay (HOSPITAL_BASED_OUTPATIENT_CLINIC_OR_DEPARTMENT_OTHER): Payer: Medicare Other | Admitting: Family

## 2022-11-25 DIAGNOSIS — Z853 Personal history of malignant neoplasm of breast: Secondary | ICD-10-CM | POA: Diagnosis not present

## 2022-11-25 DIAGNOSIS — N183 Chronic kidney disease, stage 3 unspecified: Secondary | ICD-10-CM

## 2022-11-25 DIAGNOSIS — D631 Anemia in chronic kidney disease: Secondary | ICD-10-CM | POA: Diagnosis not present

## 2022-11-25 DIAGNOSIS — D509 Iron deficiency anemia, unspecified: Secondary | ICD-10-CM | POA: Insufficient documentation

## 2022-11-25 DIAGNOSIS — D508 Other iron deficiency anemias: Secondary | ICD-10-CM | POA: Diagnosis not present

## 2022-11-25 LAB — CBC WITH DIFFERENTIAL (CANCER CENTER ONLY)
Abs Immature Granulocytes: 0.03 10*3/uL (ref 0.00–0.07)
Basophils Absolute: 0.1 10*3/uL (ref 0.0–0.1)
Basophils Relative: 1 %
Eosinophils Absolute: 0.6 10*3/uL — ABNORMAL HIGH (ref 0.0–0.5)
Eosinophils Relative: 6 %
HCT: 36 % (ref 36.0–46.0)
Hemoglobin: 11.9 g/dL — ABNORMAL LOW (ref 12.0–15.0)
Immature Granulocytes: 0 %
Lymphocytes Relative: 35 %
Lymphs Abs: 3.6 10*3/uL (ref 0.7–4.0)
MCH: 30.4 pg (ref 26.0–34.0)
MCHC: 33.1 g/dL (ref 30.0–36.0)
MCV: 92.1 fL (ref 80.0–100.0)
Monocytes Absolute: 0.6 10*3/uL (ref 0.1–1.0)
Monocytes Relative: 6 %
Neutro Abs: 5.4 10*3/uL (ref 1.7–7.7)
Neutrophils Relative %: 52 %
Platelet Count: 254 10*3/uL (ref 150–400)
RBC: 3.91 MIL/uL (ref 3.87–5.11)
RDW: 13.9 % (ref 11.5–15.5)
WBC Count: 10.2 10*3/uL (ref 4.0–10.5)
nRBC: 0 % (ref 0.0–0.2)

## 2022-11-25 LAB — CMP (CANCER CENTER ONLY)
ALT: 16 U/L (ref 0–44)
AST: 15 U/L (ref 15–41)
Albumin: 4.6 g/dL (ref 3.5–5.0)
Alkaline Phosphatase: 70 U/L (ref 38–126)
Anion gap: 12 (ref 5–15)
BUN: 31 mg/dL — ABNORMAL HIGH (ref 8–23)
CO2: 22 mmol/L (ref 22–32)
Calcium: 10.2 mg/dL (ref 8.9–10.3)
Chloride: 104 mmol/L (ref 98–111)
Creatinine: 1.25 mg/dL — ABNORMAL HIGH (ref 0.44–1.00)
GFR, Estimated: 47 mL/min — ABNORMAL LOW (ref >60)
Glucose, Bld: 157 mg/dL — ABNORMAL HIGH (ref 70–99)
Potassium: 4.3 mmol/L (ref 3.5–5.1)
Sodium: 138 mmol/L (ref 135–145)
Total Bilirubin: 0.6 mg/dL (ref 0.3–1.2)
Total Protein: 7.6 g/dL (ref 6.5–8.1)

## 2022-11-25 LAB — RETICULOCYTES
Immature Retic Fract: 11.2 % (ref 2.3–15.9)
RBC.: 3.96 MIL/uL (ref 3.87–5.11)
Retic Count, Absolute: 84.3 10*3/uL (ref 19.0–186.0)
Retic Ct Pct: 2.1 % (ref 0.4–3.1)

## 2022-11-25 LAB — IRON AND IRON BINDING CAPACITY (CC-WL,HP ONLY)
Iron: 75 ug/dL (ref 28–170)
Saturation Ratios: 23 % (ref 10.4–31.8)
TIBC: 332 ug/dL (ref 250–450)
UIBC: 257 ug/dL (ref 148–442)

## 2022-11-25 LAB — FERRITIN: Ferritin: 175 ng/mL (ref 11–307)

## 2022-11-25 NOTE — Progress Notes (Signed)
Hematology and Oncology Follow Up Visit  [name] [id number] [birth year] 67 y.o. 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 She is doing well but feels fatigued. She has been keeping 3 of her grandchildren for the last 3 months and they have been a hand full. She plans to take a nap today.  She has had a few episodes of colitis since we last saw her. She has an appointment coming up with GI.  She has not noted any obvious blood loss. No bruising or petechiae.  No swelling or tenderness in her extremities.  Tingling in her fingertips comes and goes.  No falls or syncope reported.  Appetite and hydration are good. Weight is stable at 144 lbs.   ECOG Performance Status: 1 - Symptomatic but completely ambulatory  Medications:  Allergies as of 11/25/2022       Reactions   Pneumococcal Vaccines Other (See Comments)   Really high fever, and flu symptoms. MD instructed not to give   Cleocin [clindamycin Hcl] Other (See Comments)   "irritated my esophagus"   Morphine And Codeine Nausea And Vomiting   Other Nausea And Vomiting, Swelling   Shrimp if eat a lot   Shellfish-derived Products Other (See Comments)   Patient stated she is allergic to "shrimp," if she eats "a lot"   Ativan [lorazepam] Other (See Comments)   Difficulty waking   Buprenorphine Hcl Nausea And Vomiting   Pt does not know if she has ever had this medication        Medication List        Accurate as of Nov 25, 2022 12:58 PM. If you have any questions, ask your nurse or doctor.          acetaminophen 500 MG tablet Commonly known as: TYLENOL Take 500 mg by mouth every 6 (six) hours as needed  for mild pain, headache or fever.   albuterol 108 (90 Base) MCG/ACT inhaler Commonly known as: VENTOLIN HFA Inhale 2 puffs into the lungs every 6 (six) hours as needed for wheezing or shortness of breath.   Alogliptin Benzoate 12.5 MG Tabs Take 1 tablet by mouth daily.   amLODipine 2.5 MG tablet Commonly known as: NORVASC TAKE 1 TABLET BY MOUTH EVERY DAY   ammonium lactate 12 % lotion Commonly known as: LAC-HYDRIN Apply 1 application topically daily.   atorvastatin 10 MG tablet Commonly known as: LIPITOR Take 10 mg by mouth at bedtime.   carvedilol 6.25 MG tablet Commonly known as: COREG TAKE 1 TABLET BY MOUTH 2 TIMES DAILY WITH A MEAL.   cholecalciferol 25 MCG (1000 UNIT) tablet Commonly known as: VITAMIN D3 Take 1,000 Units by mouth in the morning.   Eliquis 5 MG Tabs tablet Generic drug: apixaban TAKE 1 TABLET BY MOUTH TWICE A DAY   Entresto 24-26 MG Generic drug: sacubitril-valsartan Take 1 tablet by mouth 2 (two) times daily.   fluticasone 50 MCG/ACT nasal spray Commonly known as: FLONASE Place 1 spray into both nostrils daily as needed for allergies or rhinitis.   furosemide 20 MG tablet Commonly known as: LASIX Take 20 mg by mouth daily as needed for edema (  weight gain of 2 lbs or more x 2 days).   glipiZIDE 10 MG 24 hr tablet Commonly known as: GLUCOTROL XL Take 10 mg by mouth in the morning and at bedtime.   loratadine 10 MG tablet Commonly known as: CLARITIN Take 10 mg by mouth in the morning.   MELATONIN PO Take by mouth at bedtime.   montelukast 10 MG tablet Commonly known as: SINGULAIR Take 10 mg by mouth at bedtime.   omeprazole 20 MG capsule Commonly known as: PRILOSEC Take 20 mg by mouth in the morning.   sertraline 50 MG tablet Commonly known as: ZOLOFT Take 50 mg by mouth in the morning.   sodium bicarbonate 650 MG tablet Take 650 mg by mouth 2 (two) times daily.   Trulicity 3 MG/0.5ML Sopn Generic drug: Dulaglutide Inject 3  mg under the skin once a week.   Trulicity 3 MG/0.5ML Sopn Generic drug: Dulaglutide Inject 3 mg into the skin once a week. (stop 1.5mg  pens)   vitamin C 1000 MG tablet Take 1,000 mg by mouth in the morning.   VITAMIN E PO Take 1 capsule by mouth daily. Unknown strength   Zinc 50 MG Tabs Take 50 mg by mouth in the morning.        Allergies:  Allergies  Allergen Reactions   Pneumococcal Vaccines Other (See Comments)    Really high fever, and flu symptoms. MD instructed not to give   Cleocin [Clindamycin Hcl] Other (See Comments)    "irritated my esophagus"   Morphine And Codeine Nausea And Vomiting   Other Nausea And Vomiting and Swelling    Shrimp if eat a lot   Shellfish-Derived Products Other (See Comments)    Patient stated she is allergic to "shrimp," if she eats "a lot"   Ativan [Lorazepam] Other (See Comments)    Difficulty waking   Buprenorphine Hcl Nausea And Vomiting    Pt does not know if she has ever had this medication    Past Medical History, Surgical history, Social history, and Family History were reviewed and updated.  Review of Systems: All other 10 point review of systems is negative.   Physical Exam:  vitals were not taken for this visit.   Wt Readings from Last 3 Encounters:  09/08/22 143 lb (64.9 kg)  07/29/22 146 lb 1.9 oz (66.3 kg)  12/30/21 152 lb (68.9 kg)    Ocular: Sclerae unicteric, pupils equal, round and reactive to light Ear-nose-throat: Oropharynx clear, dentition fair Lymphatic: No cervical or supraclavicular adenopathy Lungs no rales or rhonchi, good excursion bilaterally Heart regular rate and rhythm, no murmur appreciated Abd soft, nontender, positive bowel sounds MSK no focal spinal tenderness, no joint edema Neuro: non-focal, well-oriented, appropriate affect Breasts: Deferred   Lab Results  Component Value Date   WBC 9.0 07/29/2022   HGB 12.2 07/29/2022   HCT 37.6 07/29/2022   MCV 92.2 07/29/2022   PLT 257  07/29/2022   Lab Results  Component Value Date   FERRITIN 180 07/29/2022   IRON 79 07/29/2022   TIBC 311 07/29/2022   UIBC 232 07/29/2022   IRONPCTSAT 25 07/29/2022   Lab Results  Component Value Date   RETICCTPCT 2.2 07/29/2022   RBC 4.13 07/29/2022   RBC 4.08 07/29/2022   No results found for: "KPAFRELGTCHN", "LAMBDASER", "KAPLAMBRATIO" Lab Results  Component Value Date   IGGSERUM 789 11/15/2016   IGA 150 11/15/2016   IGMSERUM 86 11/15/2016   No results found for: "TOTALPROTELP", "ALBUMINELP", "A1GS", "A2GS", "  BETS", "BETA2SER", "GAMS", "MSPIKE", "SPEI"   Chemistry      Component Value Date/Time   NA 143 10/14/2022 1202   NA 144 04/17/2017 1150   NA 141 04/18/2016 1256   K 4.5 10/14/2022 1202   K 4.6 04/17/2017 1150   K 4.4 04/18/2016 1256   CL 104 10/14/2022 1202   CL 109 (H) 04/17/2017 1150   CO2 19 (L) 10/14/2022 1202   CO2 23 04/17/2017 1150   CO2 18 (L) 04/18/2016 1256   BUN 24 10/14/2022 1202   BUN 28 (H) 04/17/2017 1150   BUN 27.3 (H) 04/18/2016 1256   CREATININE 1.21 (H) 10/14/2022 1202   CREATININE 1.20 (H) 07/29/2022 1318   CREATININE 1.3 (H) 04/17/2017 1150   CREATININE 1.4 (H) 04/18/2016 1256      Component Value Date/Time   CALCIUM 9.3 10/14/2022 1202   CALCIUM 9.6 04/17/2017 1150   CALCIUM 9.4 04/18/2016 1256   ALKPHOS 70 07/29/2022 1318   ALKPHOS 62 04/17/2017 1150   ALKPHOS 68 04/18/2016 1256   AST 13 (L) 07/29/2022 1318   AST 14 04/18/2016 1256   ALT 15 07/29/2022 1318   ALT 20 04/17/2017 1150   ALT 23 04/18/2016 1256   BILITOT 0.4 07/29/2022 1318   BILITOT <0.22 04/18/2016 1256       Impression and Plan: Ms. Gohlke is a very pleasant 67 yo caucasian female with remote history of stage I ductal carcinoma of the left breast diagnosed in 2003 as well as iron deficiency.  Iron studies are pending.  No ESA needed, Hgb 11.9.  Follow-up in 6 months.    Eileen Stanford, NP 5/24/202412:58 PM

## 2022-12-24 DIAGNOSIS — M25551 Pain in right hip: Secondary | ICD-10-CM | POA: Insufficient documentation

## 2023-01-19 ENCOUNTER — Ambulatory Visit (INDEPENDENT_AMBULATORY_CARE_PROVIDER_SITE_OTHER): Payer: Medicare Other | Admitting: Gastroenterology

## 2023-01-19 ENCOUNTER — Encounter: Payer: Self-pay | Admitting: Gastroenterology

## 2023-01-19 VITALS — BP 100/60 | HR 76 | Ht 59.0 in | Wt 147.0 lb

## 2023-01-19 DIAGNOSIS — K219 Gastro-esophageal reflux disease without esophagitis: Secondary | ICD-10-CM

## 2023-01-19 DIAGNOSIS — D509 Iron deficiency anemia, unspecified: Secondary | ICD-10-CM

## 2023-01-19 DIAGNOSIS — R110 Nausea: Secondary | ICD-10-CM | POA: Diagnosis not present

## 2023-01-19 DIAGNOSIS — N189 Chronic kidney disease, unspecified: Secondary | ICD-10-CM | POA: Diagnosis not present

## 2023-01-19 MED ORDER — ONDANSETRON 4 MG PO TBDP: 4.0000 mg | ORAL_TABLET | Freq: Three times a day (TID) | ORAL | 2 refills | Status: AC | PRN

## 2023-01-19 NOTE — Patient Instructions (Addendum)
_______________________________________________________  If your blood pressure at your visit was 140/90 or greater, please contact your primary care physician to follow up on this.  _______________________________________________________  If you are age 67 or older, your body mass index should be between 23-30. Your Body mass index is 29.69 kg/m. If this is out of the aforementioned range listed, please consider follow up with your Primary Care Provider.  If you are age 77 or younger, your body mass index should be between 19-25. Your Body mass index is 29.69 kg/m. If this is out of the aformentioned range listed, please consider follow up with your Primary Care Provider.   ________________________________________________________  The [facility] providers would like to encourage you to use [facility] to communicate with providers for non-urgent requests or questions.  Due to long hold times on the telephone, sending your provider a message by [facility] may be a faster and more efficient way to get a response.  Please allow 48 business hours for a response.  Please remember that this is for non-urgent requests.  _______________________________________________________  Continue omeprazole  We have sent the following medications to your pharmacy for you to pick up at your convenience: Zofran   Please follow up in 6 months. Give [place] a call at [phone] to schedule an appointment.  Thank you,  Dr. Lynann Bologna

## 2023-01-19 NOTE — Progress Notes (Signed)
Chief Complaint: GI eval  Referring Provider:  Nonnie Done., MD      ASSESSMENT AND PLAN;   #1. GERD with occ nausea  #2. IDA likely d/t CKD. Neg colon Nov 2015.   Plan: -Continue Omeprazole 20mg  po every day -Refill Zofran 4mg  po ODT #20, 2RF -RTC 6 months. Then, would consider further eval by means of EGD/colon. If neg, would consider GES to r/o diabetic gastroparesis.  Hopefully, [name] would feel better by then.   HPI:    [name] is a 67 y.o. female  With multiple medical problems including CHF (EF 40-45% 09/2022), CKD, Afib on eliquis s/p post ablation done in Nov 25, 2020 complicated with retroperitoneal bleed, DM (on trulicity), breast CA, HLD, HTN  Here for ED follow-up from 06/14/2022 Seen for Abdo pain with associated nausea but no vomiting, nonbloody diarrhea x 3 days prior. She had normal CBC, normal CMP except creatinine 1.04, hemoglobin 11.5 CT Abdo/pelvis as below showed enteritis.  Here for follow-up visit Doing really well  Denies having any GI symptoms.  Her heartburn is under good control on omeprazole 20 mg p.o. daily.  She did have nausea when she was visiting [place] few weeks ago but no vomiting.  This was controlled by as needed Zofran.  She denies having any further diarrhea or constipation.  Note that she had diarrhea when she was on metformin previously.  No fever chills or night sweats.  No recent weight loss.  Unfortunately, her husband Vernon-had staph infection on replaced right knee and is currently being evaluated for redo surgery.  Patient is quite distressed regarding that.  She wants to hold off on any GI evaluation until her husband feels better.   Wt Readings from Last 3 Encounters:  01/19/23 147 lb (66.7 kg)  09/08/22 143 lb (64.9 kg)  07/29/22 146 lb 1.9 oz (66.3 kg)      Past GI workup:  Colonoscopy 05/2014 ([facility]) -Colonic polyp s/p polypectomy. Bx-hyperplastic polyp.  Repeat in 10 years.  Earlier, if with  problems -Mild sigmoid diverticulosis -Small internal hemorrhoids -Otherwise normal colonoscopy to TI. -Negative random colon biopsies for microscopic colitis  EGD 11/2016 -Gastric polyps s/p polypectomy. Bx-hyperplastic polyp -Otherwise normal EGD. -Negative small bowel biopsies for celiac -Negative CLO  CT Abdo/pelvis with contrast 06/14/2022 1. Marked circumferential bowel wall thickening with submucosal edema, hyperemia, and mesenteric edema involving a relatively long segment of distal small bowel in keeping with infectious or inflammatory enteritis. No evidence of obstruction or perforation. 2. Moderate coronary artery calcification. 3.  Aortic Atherosclerosis (ICD10-I70.0).   SH- Vernon had   Past Medical History:  Diagnosis Date   Acquired thrombophilia (HCC) 01/29/2020   AKI (acute kidney injury) (HCC) 11/20/2019   Allergy 09/08/2016   AMS (altered mental status)    Anemia in end-stage renal disease (HCC) 07/16/2019   Anemia, normocytic normochromic 07/27/2018   Ankle joint pain 04/09/2018   Arthritis    "back, knees, arms, wrists" (05/15/2013)   Asthma    Asymmetrical left sensorineural hearing loss 07/31/2019   Atrial fibrillation with RVR (HCC) 11/15/2016   Breast cancer (HCC)    C. difficile colitis    Calculus of kidney 08/12/2012   CHF (congestive heart failure) (HCC)    "mild" (05/15/2013)   CHF (congestive heart failure) (HCC)    "mild" (05/15/2013)   Chronic bronchitis (HCC)    Chronic lower back pain    Chronic non-seasonal allergic rhinitis 11/15/2016   Chronic sphenoidal sinusitis 02/08/2022  Chronic systolic congestive heart failure (HCC)    Chronic UTI    Cough variant asthma vs uacs  07/24/2017   Onset in her 20s some better p rx with allergy shots in her 30s Singulair trial 11/15/16 >  Improved 01/18/17:  FEV1 1.29 L (61%)  Ratio 75  Still on ACEi as of 07/24/2017  Spirometry 06/28/18   FEV1 1.3 (61%)  Ratio 77 with min curvature   - Allergy  profile 07/26/2018 >  Eos 1.2 /  IgE  420  RAST Pos mold only    Cough variant asthma vs uacs  07/24/2017   Onset in her 20s some better p rx with allergy shots in her 30s Singulair trial 11/15/16 >  Improved 01/18/17:  FEV1 1.29 L (61%)  Ratio 75  Still on ACEi as of 07/24/2017  Spirometry 06/28/18   FEV1 1.3 (61%)  Ratio 77 with min curvature   - Allergy profile 07/26/2018 >  Eos 1.2 /  IgE  420  RAST Pos mold only    Diabetes mellitus type 2 in obese    Diarrhea 11/15/2016   Dilated cardiomyopathy (HCC) 07/16/2019   DOE (dyspnea on exertion) 07/27/2018   Onset early 2019 assoc with uacs while on ACEi - 06/28/18    Walked RA x one lap =  approx 250 ft - stopped due to sob with sats still high 90's      Dysphagia    Dysrhythmia    atrial fib/dr [facility]   Family history of anesthesia complication    "my mother also had PONV" (05/15/2013)   Fever 10/29/2017   Foot pain 04/09/2018   GERD (gastroesophageal reflux disease)    Heart murmur    High cholesterol    History of breast cancer in female 07/19/2013   HTN (hypertension) 05/08/2013   Hydronephrosis of right kidney    Hyperlipidemia 11/15/2016   Hypomagnesemia    Hyponatremia 10/29/2017   IDA (iron deficiency anemia) 04/19/2017   Infection    Kidney stones    Left breast mass 07/19/2013   Low back pain 05/01/2018   Migraine    "haven't had one in the early 2000's" (05/15/2013)   Mitral regurgitation 09/03/2020   Mixed incontinence 07/24/2018   Nephrolithiasis 11/15/2016   Other chronic cystitis 05/19/2011   PAF (paroxysmal atrial fibrillation) (HCC)    Personal history of chemotherapy    Personal history of radiation therapy    Pneumonia    "used to be chronic; last time I had it was 2013" (05/15/2013)   PONV (postoperative nausea and vomiting)    Recurrent sinusitis 09/08/2016   Recurrent UTI 11/20/2014   Recurrent UTI (urinary tract infection)    "from the continuous kidney stones; take Macrodantin qd"  (05/15/2013)   Referred otalgia of left ear 04/22/2021   Respiratory failure requiring intubation (HCC) 05/08/2013   Restrictive lung disease 01/18/2017   Retroperitoneal bleed 12/05/2020   Sacroiliac joint pain 05/16/2018   Sepsis (HCC) 2005   kidney stone infection   Septic shock (HCC) 09/23/2018   Simple chronic bronchitis (HCC) 11/15/2016   Strep throat 10/29/2017   Tachypnea    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   Tension headache    Type II diabetes mellitus (HCC)    Type II or unspecified type diabetes mellitus without mention of complication, not stated as uncontrolled 05/11/2013   Urinary tract stones 05/19/2011   Urinary urgency 07/24/2018    Past Surgical History:  Procedure Laterality Date   ATRIAL FIBRILLATION  ABLATION N/A 11/25/2020   Procedure: ATRIAL FIBRILLATION ABLATION;  Surgeon: Regan Lemming, MD;  Location: [facility];  Service: Cardiovascular;  Laterality: N/A;   BILATERAL OOPHORECTOMY Bilateral 2006   "cause I needed to 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" (05/15/2013)   BREAST BIOPSY Left 02/2001   BREAST LUMPECTOMY Left 02/2001   BREAST LUMPECTOMY Left 09/23/2013   Procedure: LEFT LUMPECTOMY WITH SPECIMEN MAMMOGRAM;  Surgeon: Ernestene Mention, MD;  Location: [facility];  Service: General;  Laterality: Left;   BUBBLE STUDY  11/24/2020   Procedure: BUBBLE STUDY;  Surgeon: Sande Rives, MD;  Location: [facility]COPY;  Service: Cardiovascular;;   COLONOSCOPY  05/16/2014   Colonic polyp status post polypectomy. Minimal sigmoid diverticulosis. Small internal hemorrhoids. Otherwise normal colonoscopy to terminal ileum.   DIAGNOSTIC LAPAROSCOPY     cyst-near ovary   ESOPHAGOGASTRODUODENOSCOPY  11/25/2016   Gastric polyp(s) Otherwise normal duoedenoscopy examination   LITHOTRIPSY     "2-3 times prior to 2002" (05/15/2013)   SPINAL FUSION N/A 05/08/2013   Procedure: T9-S1 INSTRUMENTED FUSION T12 -S1  DECOMPRESSION;  Surgeon: Venita Lick, MD;  Location: [facility] OR;  Service: Orthopedics;  Laterality: N/A;   TEE WITHOUT CARDIOVERSION N/A 11/06/2020   Procedure: TRANSESOPHAGEAL ECHOCARDIOGRAM (TEE);  Surgeon: Pricilla Riffle, MD;  Location: [facility] ENDOSCOPY;  Service: Cardiovascular;  Laterality: N/A;   TEE WITHOUT CARDIOVERSION N/A 11/24/2020   Procedure: TRANSESOPHAGEAL ECHOCARDIOGRAM (TEE);  Surgeon: Sande Rives, MD;  Location: [facility] ENDOSCOPY;  Service: Cardiovascular;  Laterality: N/A;   TUBAL LIGATION Bilateral 1985    Family History  Problem Relation Age of Onset   COPD Mother    Heart disease Mother    Breast cancer Mother    Hypertension Mother    Hyperlipidemia Mother    Kidney cancer Father    Heart disease Sister    Liver cancer Brother    Heart disease Maternal Grandmother    Heart disease Maternal Grandfather    Rheum arthritis Maternal Grandfather    Heart disease Paternal Grandmother    Heart disease Paternal Grandfather    Environmental Allergies Son    Environmental Allergies Other    Lupus Other        niece   Diabetes Daughter     Social History   Tobacco Use   Smoking status: Never   Smokeless tobacco: Never   Tobacco comments:    Mother & Father smoked  Vaping Use   Vaping status: Never Used  Substance Use Topics   Alcohol use: No    Alcohol/week: 0.0 standard drinks of alcohol   Drug use: No    Current Outpatient Medications  Medication Sig Dispense Refill   acetaminophen (TYLENOL) 500 MG tablet Take 500 mg by mouth every 6 (six) hours as needed for mild pain, headache or fever.     albuterol (VENTOLIN HFA) 108 (90 Base) MCG/ACT inhaler Inhale 2 puffs into the lungs every 6 (six) hours as needed for wheezing or shortness of breath.     Alogliptin Benzoate 12.5 MG TABS Take 1 tablet by mouth daily.     amLODipine (NORVASC) 2.5 MG tablet TAKE 1 TABLET BY MOUTH EVERY DAY 90 tablet 2   ammonium lactate (LAC-HYDRIN) 12 % lotion Apply 1 application  topically daily.     atorvastatin (LIPITOR) 10 MG tablet Take 10 mg by mouth at bedtime.     carvedilol (COREG) 6.25 MG tablet TAKE 1 TABLET BY MOUTH 2 TIMES  DAILY WITH A MEAL. 180 tablet 1   cholecalciferol (VITAMIN D) 25 MCG (1000 UNIT) tablet Take 1,000 Units by mouth in the morning.     ELIQUIS 5 MG TABS tablet TAKE 1 TABLET BY MOUTH TWICE A DAY 180 tablet 1   fluticasone (FLONASE) 50 MCG/ACT nasal spray Place 1 spray into both nostrils daily as needed for allergies or rhinitis.     furosemide (LASIX) 20 MG tablet Take 20 mg by mouth daily as needed for edema (weight gain of 2 lbs or more x 2 days).     glipiZIDE (GLUCOTROL XL) 10 MG 24 hr tablet Take 10 mg by mouth in the morning and at bedtime.      loratadine (CLARITIN) 10 MG tablet Take 10 mg by mouth in the morning.     MELATONIN PO Take by mouth at bedtime.     montelukast (SINGULAIR) 10 MG tablet Take 10 mg by mouth at bedtime.     omeprazole (PRILOSEC) 20 MG capsule Take 20 mg by mouth in the morning.     sacubitril-valsartan (ENTRESTO) 24-26 MG Take 1 tablet by mouth 2 (two) times daily. 180 tablet 3   sertraline (ZOLOFT) 50 MG tablet Take 50 mg by mouth in the morning.     TRULICITY 3 MG/0.5ML SOPN Inject 3 mg under the skin once a week. 2 mL 5   VITAMIN E PO Take 1 capsule by mouth daily. Unknown strength     Zinc 50 MG TABS Take 50 mg by mouth in the morning.     Ascorbic Acid (VITAMIN C) 1000 MG tablet Take 1,000 mg by mouth in the morning. (Patient not taking: Reported on 01/19/2023)     No current facility-administered medications for this visit.    Allergies  Allergen Reactions   Pneumococcal Vaccines Other (See Comments)    Really high fever, and flu symptoms. MD instructed not to give   Cleocin [Clindamycin Hcl] Other (See Comments)    "irritated my esophagus"   Morphine And Codeine Nausea And Vomiting   Other Nausea And Vomiting and Swelling    Shrimp if eat a lot   Shellfish-Derived Products Other (See Comments)     Patient stated she is allergic to "shrimp," if she eats "a lot"   Ativan [Lorazepam] Other (See Comments)    Difficulty waking up    Buprenorphine Hcl Nausea And Vomiting    Pt does not know if she has ever had this medication    Review of Systems:  Constitutional: Denies fever, chills, diaphoresis, appetite change and has fatigue.  HEENT: Has allergies, hearing problems Cardiovascular: Denies chest pain, palpitations and leg swelling.  Has cough. Genitourinary: Denies dysuria, urgency, frequency, hematuria, flank pain and difficulty urinating.  Musculoskeletal: has myalgias, back pain, joint swelling, arthralgias and gait problem.  Skin: No rash.  Neurological: Denies dizziness, seizures, syncope, weakness, light-headedness, numbness and headaches.  Hematological: Denies adenopathy. Easy bruising, personal or family bleeding history  Psychiatric/Behavioral: Has anxiety or depression     Physical Exam:    BP 100/60 (BP Location: Left Arm, Patient Position: Sitting, Cuff Size: Normal)   Pulse 76   Ht 4\' 11"  (1.499 m)   Wt 147 lb (66.7 kg)   BMI 29.69 kg/m  Wt Readings from Last 3 Encounters:  01/19/23 147 lb (66.7 kg)  09/08/22 143 lb (64.9 kg)  07/29/22 146 lb 1.9 oz (66.3 kg)   Constitutional:  Well-developed, in no acute distress. Psychiatric: Normal mood and affect. Behavior is  normal. HEENT: Pupils normal.  Conjunctivae are normal. No scleral icterus. Neck supple.  Cardiovascular: Normal rate, regular rhythm. No edema Pulmonary/chest: Effort normal and breath sounds normal. No wheezing, rales or rhonchi. Abdominal: Soft, nondistended. Nontender. Bowel sounds active throughout. There are no masses palpable. No hepatomegaly. Rectal: Deferred Neurological: Alert and oriented to person place and time. Skin: Skin is warm and dry. No rashes noted.  Data Reviewed: I have personally reviewed following labs and imaging studies  CBC:    Latest Ref Rng & Units 11/25/2022    12:44 PM 07/29/2022    1:18 PM 06/14/2022    6:15 PM  CBC  WBC 4.0 - 10.5 K/uL 10.2  9.0  8.7   Hemoglobin 12.0 - 15.0 g/dL 82.9  56.2  13.0   Hematocrit 36.0 - 46.0 % 36.0  37.6  35.4   Platelets 150 - 400 K/uL 254  257  265     CMP:    Latest Ref Rng & Units 11/25/2022   12:44 PM 10/14/2022   12:02 PM 09/27/2022   12:00 AM  CMP  Glucose 70 - 99 mg/dL 865  784  696   BUN 8 - 23 mg/dL 31  24  20    Creatinine 0.44 - 1.00 mg/dL 2.95  2.84  1.32   Sodium 135 - 145 mmol/L 138  143  144   Potassium 3.5 - 5.1 mmol/L 4.3  4.5  3.6   Chloride 98 - 111 mmol/L 104  104  102   CO2 22 - 32 mmol/L 22  19  18    Calcium 8.9 - 10.3 mg/dL 44.0  9.3  8.7   Total Protein 6.5 - 8.1 g/dL 7.6     Total Bilirubin 0.3 - 1.2 mg/dL 0.6     Alkaline Phos 38 - 126 U/L 70     AST 15 - 41 U/L 15     ALT 0 - 44 U/L 16       ED reviewed independently    Edman Circle, MD 01/19/2023, 9:40 AM  Cc: Nonnie Done., MD

## 2023-02-07 ENCOUNTER — Other Ambulatory Visit (HOSPITAL_COMMUNITY): Payer: Self-pay

## 2023-02-26 ENCOUNTER — Other Ambulatory Visit (HOSPITAL_COMMUNITY): Payer: Self-pay

## 2023-02-26 MED ORDER — ATORVASTATIN CALCIUM 20 MG PO TABS
20.0000 mg | ORAL_TABLET | Freq: Every day | ORAL | 3 refills | Status: DC
  Filled 2023-02-26 – 2023-03-01 (×2): qty 90, 90d supply, fill #0

## 2023-02-27 ENCOUNTER — Other Ambulatory Visit (HOSPITAL_COMMUNITY): Payer: Self-pay

## 2023-03-01 ENCOUNTER — Other Ambulatory Visit (HOSPITAL_COMMUNITY): Payer: Self-pay

## 2023-03-01 MED ORDER — TRULICITY 3 MG/0.5ML ~~LOC~~ SOAJ
3.0000 mg | SUBCUTANEOUS | 5 refills | Status: DC
  Filled 2023-03-01 – 2023-03-03 (×2): qty 2, 28d supply, fill #0
  Filled 2023-04-03: qty 2, 28d supply, fill #1
  Filled 2023-05-19: qty 2, 28d supply, fill #2
  Filled 2023-06-07 – 2023-06-11 (×2): qty 2, 28d supply, fill #3
  Filled 2023-07-17: qty 2, 28d supply, fill #4
  Filled 2023-08-06 – 2023-08-30 (×2): qty 2, 28d supply, fill #5

## 2023-03-03 ENCOUNTER — Other Ambulatory Visit (HOSPITAL_COMMUNITY): Payer: Self-pay

## 2023-03-10 ENCOUNTER — Other Ambulatory Visit (HOSPITAL_COMMUNITY): Payer: Self-pay

## 2023-03-27 ENCOUNTER — Other Ambulatory Visit: Payer: Self-pay | Admitting: Cardiology

## 2023-03-27 DIAGNOSIS — I480 Paroxysmal atrial fibrillation: Secondary | ICD-10-CM

## 2023-03-27 NOTE — Telephone Encounter (Signed)
Prescription refill request for Eliquis received. Indication:AFIB Last office visit:3/24 Scr:1.25  5/24 Age: 67 Weight:66.7  KG  PRESCRIPTION REFILLED

## 2023-04-04 ENCOUNTER — Encounter: Payer: Self-pay | Admitting: Pharmacist

## 2023-04-04 ENCOUNTER — Other Ambulatory Visit (HOSPITAL_BASED_OUTPATIENT_CLINIC_OR_DEPARTMENT_OTHER): Payer: Self-pay

## 2023-04-04 ENCOUNTER — Other Ambulatory Visit: Payer: Self-pay

## 2023-04-04 ENCOUNTER — Other Ambulatory Visit (HOSPITAL_COMMUNITY): Payer: Self-pay

## 2023-04-22 ENCOUNTER — Other Ambulatory Visit (HOSPITAL_COMMUNITY): Payer: Self-pay

## 2023-05-05 ENCOUNTER — Telehealth: Payer: Self-pay

## 2023-05-05 NOTE — Telephone Encounter (Signed)
   Name: [name]  DOB: 05/18/1956  MRN: [id number]   Primary Cardiologist: Gypsy Balsam, MD  Late request received at 4:45 pm on 05/05/2023 for surgery dated 05/08/2023. Attempted to contact patient x 3 with one voice mail left for return call. Also tried patient's spouse but went right to voice mail.   Spoke with Dr. Nilsa Nutting office to inform them of inability to contact patient. Left message for surgery scheduler, Roderic Palau to update her.   We are unable to provide cardiac clearance for this patient.   I will route this to requesting office.   Carlos Levering, NP 05/05/2023, 5:18 PM

## 2023-05-05 NOTE — Telephone Encounter (Signed)
   [facility] Pre-operative Risk Assessment    Request for surgical clearance:  What type of surgery is being performed? Right Knee Scope Partial Medial Meniscus    When is this surgery scheduled? 05/08/2023   What type of clearance is required (medical clearance vs. Pharmacy clearance to hold med vs. Both)? Both  Are there any medications that need to be held prior to surgery and how long?Eliquis-holding length not specified   Practice name and name of physician performing surgery? Dr. Earleen Reaper at [facility]   What is your office phone number: [phone]    7.   What is your office fax number: [phone]  8.   Anesthesia type (None, local, MAC, general) ? Choice Anesthesia   [name] [name] 05/05/2023, 4:43 PM  _________________________________________________________________   (provider comments below)

## 2023-05-05 NOTE — Telephone Encounter (Signed)
Patient with diagnosis of A fib on Eliquis for anticoagulation.    Procedure: Right Knee Scope Partial Medial Meniscus  Date of procedure: 05/08/23   CHA2DS2-VASc Score = 6  This indicates a 9.7% annual risk of stroke. The patient's score is based upon: CHF History: 1 HTN History: 1 Diabetes History: 1 Stroke History: 0 Vascular Disease History: 1 Age Score: 1 Gender Score: 1    CrCl 45 ml/min Platelet count 254K   Per office protocol, patient can hold Eliquis for 3 days prior to procedure.     **This guidance is not considered finalized until pre-operative APP has relayed final recommendations.**

## 2023-05-08 NOTE — Telephone Encounter (Addendum)
See notes from pre op APP Alexandra Levering, NP who attempted to reach the pt and the pt's spouse as well. NP also s/w the surgery scheduler Rosalva Ferron.   Pt has appt 05/12/23 with Dr. Bing Matter.

## 2023-05-08 NOTE — Telephone Encounter (Signed)
Pre-op team,   Please contact the requesting office to determine the status of this request. It came in late Friday 11/1 for surgery scheduled today. Patient unable to be contacted and clearance could not be provided. Requesting office was informed of difficulty contact patient on Friday.   Thank you!  [name]. Latori Beggs, DNP, NP-C  05/08/2023, 10:34 AM [facility] [address] Office [phone] Fax [phone]

## 2023-05-09 ENCOUNTER — Other Ambulatory Visit (HOSPITAL_COMMUNITY): Payer: Self-pay

## 2023-05-09 ENCOUNTER — Other Ambulatory Visit: Payer: Self-pay

## 2023-05-09 ENCOUNTER — Other Ambulatory Visit (HOSPITAL_BASED_OUTPATIENT_CLINIC_OR_DEPARTMENT_OTHER): Payer: Self-pay

## 2023-05-12 ENCOUNTER — Ambulatory Visit: Payer: Medicare Other | Attending: Cardiology | Admitting: Cardiology

## 2023-05-12 ENCOUNTER — Encounter: Payer: Self-pay | Admitting: Cardiology

## 2023-05-12 VITALS — BP 102/60 | HR 94 | Ht 59.0 in | Wt 146.6 lb

## 2023-05-12 DIAGNOSIS — E782 Mixed hyperlipidemia: Secondary | ICD-10-CM | POA: Insufficient documentation

## 2023-05-12 DIAGNOSIS — I420 Dilated cardiomyopathy: Secondary | ICD-10-CM | POA: Diagnosis present

## 2023-05-12 DIAGNOSIS — I4891 Unspecified atrial fibrillation: Secondary | ICD-10-CM | POA: Insufficient documentation

## 2023-05-12 DIAGNOSIS — I5022 Chronic systolic (congestive) heart failure: Secondary | ICD-10-CM | POA: Diagnosis present

## 2023-05-12 DIAGNOSIS — I10 Essential (primary) hypertension: Secondary | ICD-10-CM | POA: Diagnosis present

## 2023-05-12 DIAGNOSIS — R0609 Other forms of dyspnea: Secondary | ICD-10-CM | POA: Insufficient documentation

## 2023-05-12 NOTE — Progress Notes (Signed)
Cardiology Office Note:    Date:  05/12/2023   ID:  Alexandra, [name] [birth year], MRN [id number]  PCP:  Nonnie Done., MD  Cardiologist:  Gypsy Balsam, MD    Referring MD: Nonnie Done., MD   Chief Complaint  Patient presents with   Follow-up    History of Present Illness:    [name] is a 67 y.o. female with past medical history significant for paroxysmal atrial fibrillation, CHADS2 Vascor equals 4 she is anticoagulated, status post atrial fibrillation ablation done in 2022, sadly complicated with retroperitoneal bleed and from that after that she still gets some episode of atrial fibrillation.  Additional problem include mild to moderate mitral valve regurgitation latest echocardiogram done in March of this year showed mild reduction left ventricle ejection fraction.  Additional problem include essential hypertension, diabetes, dyslipidemia.  Comes today to months for follow-up overall doing well.  She did have right knee surgery just couple weeks ago doing well recovering.  Denies have any chest pain tightness squeezing pressure burning chest.  She is also taking care of her sick husband who does have Parkinson's recently got amputation of his left leg secondary to infected prosthetic knee.  Past Medical History:  Diagnosis Date   Acquired thrombophilia (HCC) 01/29/2020   AKI (acute kidney injury) (HCC) 11/20/2019   Allergy 09/08/2016   AMS (altered mental status)    Anemia in end-stage renal disease (HCC) 07/16/2019   Anemia, normocytic normochromic 07/27/2018   Ankle joint pain 04/09/2018   Arthritis    "back, knees, arms, wrists" (05/15/2013)   Asthma    Asymmetrical left sensorineural hearing loss 07/31/2019   Atrial fibrillation with RVR (HCC) 11/15/2016   Breast cancer (HCC)    C. difficile colitis    Calculus of kidney 08/12/2012   CHF (congestive heart failure) (HCC)    "mild" (05/15/2013)   CHF (congestive heart failure) (HCC)    "mild"  (05/15/2013)   Chronic bronchitis (HCC)    Chronic lower back pain    Chronic non-seasonal allergic rhinitis 11/15/2016   Chronic sphenoidal sinusitis 02/08/2022   Chronic systolic congestive heart failure (HCC)    Chronic UTI    Cough variant asthma vs uacs  07/24/2017   Onset in her 20s some better p rx with allergy shots in her 30s Singulair trial 11/15/16 >  Improved 01/18/17:  FEV1 1.29 L (61%)  Ratio 75  Still on ACEi as of 07/24/2017  Spirometry 06/28/18   FEV1 1.3 (61%)  Ratio 77 with min curvature   - Allergy profile 07/26/2018 >  Eos 1.2 /  IgE  420  RAST Pos mold only    Cough variant asthma vs uacs  07/24/2017   Onset in her 20s some better p rx with allergy shots in her 30s Singulair trial 11/15/16 >  Improved 01/18/17:  FEV1 1.29 L (61%)  Ratio 75  Still on ACEi as of 07/24/2017  Spirometry 06/28/18   FEV1 1.3 (61%)  Ratio 77 with min curvature   - Allergy profile 07/26/2018 >  Eos 1.2 /  IgE  420  RAST Pos mold only    Diabetes mellitus type 2 in obese    Diarrhea 11/15/2016   Dilated cardiomyopathy (HCC) 07/16/2019   DOE (dyspnea on exertion) 07/27/2018   Onset early 2019 assoc with uacs while on ACEi - 06/28/18    Walked RA x one lap =  approx 250 ft - stopped due to sob with sats still high 90's  Dysphagia    Dysrhythmia    atrial fib/dr [facility]   Family history of anesthesia complication    "my mother also had PONV" (05/15/2013)   Fever 10/29/2017   Foot pain 04/09/2018   GERD (gastroesophageal reflux disease)    Heart murmur    High cholesterol    History of breast cancer in female 07/19/2013   HTN (hypertension) 05/08/2013   Hydronephrosis of right kidney    Hyperlipidemia 11/15/2016   Hypomagnesemia    Hyponatremia 10/29/2017   IDA (iron deficiency anemia) 04/19/2017   Infection    Kidney stones    Left breast mass 07/19/2013   Low back pain 05/01/2018   Migraine    "haven't had one in the early 2000's" (05/15/2013)   Mitral  regurgitation 09/03/2020   Mixed incontinence 07/24/2018   Nephrolithiasis 11/15/2016   Other chronic cystitis 05/19/2011   PAF (paroxysmal atrial fibrillation) (HCC)    Personal history of chemotherapy    Personal history of radiation therapy    Pneumonia    "used to be chronic; last time I had it was 2013" (05/15/2013)   PONV (postoperative nausea and vomiting)    Recurrent sinusitis 09/08/2016   Recurrent UTI 11/20/2014   Recurrent UTI (urinary tract infection)    "from the continuous kidney stones; take Macrodantin qd" (05/15/2013)   Referred otalgia of left ear 04/22/2021   Respiratory failure requiring intubation (HCC) 05/08/2013   Restrictive lung disease 01/18/2017   Retroperitoneal bleed 12/05/2020   Sacroiliac joint pain 05/16/2018   Sepsis (HCC) 2005   kidney stone infection   Septic shock (HCC) 09/23/2018   Simple chronic bronchitis (HCC) 11/15/2016   Strep throat 10/29/2017   Tachypnea    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   Tension headache    Type II diabetes mellitus (HCC)    Type II or unspecified type diabetes mellitus without mention of complication, not stated as uncontrolled 05/11/2013   Urinary tract stones 05/19/2011   Urinary urgency 07/24/2018    Past Surgical History:  Procedure Laterality Date   ATRIAL FIBRILLATION ABLATION N/A 11/25/2020   Procedure: ATRIAL FIBRILLATION ABLATION;  Surgeon: Regan Lemming, MD;  Location: [facility];  Service: Cardiovascular;  Laterality: N/A;   BILATERAL OOPHORECTOMY Bilateral 2006   "cause I needed to 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" (05/15/2013)   BREAST BIOPSY Left 02/2001   BREAST LUMPECTOMY Left 02/2001   BREAST LUMPECTOMY Left 09/23/2013   Procedure: LEFT LUMPECTOMY WITH SPECIMEN MAMMOGRAM;  Surgeon: Ernestene Mention, MD;  Location: [facility];  Service: General;  Laterality: Left;   BUBBLE STUDY  11/24/2020   Procedure: BUBBLE STUDY;  Surgeon:  Sande Rives, MD;  Location: [facility];  Service: Cardiovascular;;   COLONOSCOPY  05/16/2014   Colonic polyp status post polypectomy. Minimal sigmoid diverticulosis. Small internal hemorrhoids. Otherwise normal colonoscopy to terminal ileum.   DIAGNOSTIC LAPAROSCOPY     cyst-near ovary   ESOPHAGOGASTRODUODENOSCOPY  11/25/2016   Gastric polyp(s) Otherwise normal duoedenoscopy examination   KNEE ARTHROSCOPY Right    Menicus Tear   LITHOTRIPSY     "2-3 times prior to 2002" (05/15/2013)   SPINAL FUSION N/A 05/08/2013   Procedure: T9-S1 INSTRUMENTED FUSION T12 -S1 DECOMPRESSION;  Surgeon: Venita Lick, MD;  Location: [facility];  Service: Orthopedics;  Laterality: N/A;   TEE WITHOUT CARDIOVERSION N/A 11/06/2020   Procedure: TRANSESOPHAGEAL ECHOCARDIOGRAM (TEE);  Surgeon: Pricilla Riffle, MD;  Location: [facility];  Service:  Cardiovascular;  Laterality: N/A;   TEE WITHOUT CARDIOVERSION N/A 11/24/2020   Procedure: TRANSESOPHAGEAL ECHOCARDIOGRAM (TEE);  Surgeon: Sande Rives, MD;  Location: [facility];  Service: Cardiovascular;  Laterality: N/A;   TUBAL LIGATION Bilateral 1985    Current Medications: Current Meds  Medication Sig   acetaminophen (TYLENOL) 500 MG tablet Take 500 mg by mouth every 6 (six) hours as needed for mild pain, headache or fever.   albuterol (VENTOLIN HFA) 108 (90 Base) MCG/ACT inhaler Inhale 2 puffs into the lungs every 6 (six) hours as needed for wheezing or shortness of breath.   amLODipine (NORVASC) 2.5 MG tablet TAKE 1 TABLET BY MOUTH EVERY DAY (Patient taking differently: Take 2.5 mg by mouth daily.)   ammonium lactate (LAC-HYDRIN) 12 % lotion Apply 1 application topically daily.   Ascorbic Acid (VITAMIN C) 1000 MG tablet Take 1,000 mg by mouth in the morning.   atorvastatin (LIPITOR) 10 MG tablet Take 10 mg by mouth at bedtime.   carvedilol (COREG) 6.25 MG tablet TAKE 1 TABLET BY MOUTH 2 TIMES DAILY WITH A MEAL.   cholecalciferol (VITAMIN D) 25  MCG (1000 UNIT) tablet Take 1,000 Units by mouth in the morning.   ELIQUIS 5 MG TABS tablet TAKE 1 TABLET BY MOUTH TWICE A DAY (Patient taking differently: Take 5 mg by mouth 2 (two) times daily.)   fluticasone (FLONASE) 50 MCG/ACT nasal spray Place 1 spray into both nostrils daily as needed for allergies or rhinitis.   furosemide (LASIX) 20 MG tablet Take 20 mg by mouth daily as needed for edema (weight gain of 2 lbs or more x 2 days).   glipiZIDE (GLUCOTROL XL) 10 MG 24 hr tablet Take 10 mg by mouth in the morning and at bedtime.    loratadine (CLARITIN) 10 MG tablet Take 10 mg by mouth in the morning.   MELATONIN PO Take 1 tablet by mouth at bedtime.   montelukast (SINGULAIR) 10 MG tablet Take 10 mg by mouth at bedtime.   omeprazole (PRILOSEC) 20 MG capsule Take 20 mg by mouth in the morning.   ondansetron (ZOFRAN-ODT) 4 MG disintegrating tablet Take 1 tablet (4 mg total) by mouth every 8 (eight) hours as needed for nausea or vomiting.   sacubitril-valsartan (ENTRESTO) 24-26 MG Take 1 tablet by mouth 2 (two) times daily.   sertraline (ZOLOFT) 50 MG tablet Take 50 mg by mouth in the morning.   TRULICITY 3 MG/0.5ML SOPN Inject 3 mg into the skin once a week.   VITAMIN E PO Take 1 capsule by mouth daily. Unknown strength   Zinc 50 MG TABS Take 50 mg by mouth in the morning.   [DISCONTINUED] Alogliptin Benzoate 12.5 MG TABS Take 1 tablet by mouth daily.   [DISCONTINUED] atorvastatin (LIPITOR) 20 MG tablet Take 1 tablet (20 mg total) by mouth at bedtime. Stop 10 mg.     Allergies:   Pneumococcal vaccines, Cleocin [clindamycin hcl], Morphine and codeine, Other, Shellfish-derived products, Ativan [lorazepam], and Buprenorphine hcl   Social History   Socioeconomic History   Marital status: Married    Spouse name: Not on file   Number of children: 2   Years of education: Not on file   Highest education level: Not on file  Occupational History   Occupation: retired  Tobacco Use   Smoking  status: Never   Smokeless tobacco: Never   Tobacco comments:    Mother & Father smoked  Vaping Use   Vaping status: Never Used  Substance and  Sexual Activity   Alcohol use: No    Alcohol/week: 0.0 standard drinks of alcohol   Drug use: No   Sexual activity: Yes  Other Topics Concern   Not on file  Social History Narrative   [facility] (11/15/16):   Patient's originally from [place]. Has always lived in [place]. Currently has an outside dog. Remote exposure to [place] when her children were young. No known mold exposure. 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 Worked primarily for the [employer].    Social Determinants of Health   Financial Resource Strain: Not on file  Food Insecurity: No Food Insecurity (03/03/2022)   Hunger Vital Sign    Worried About Running Out of Food in the Last Year: Never true    Ran Out of Food in the Last Year: Never true  Transportation Needs: No Transportation Needs (03/03/2022)   PRAPARE - Administrator, Civil Service (Medical): No    Lack of Transportation (Non-Medical): No  Physical Activity: Not on file  Stress: Not on file  Social Connections: Not on file     Family History: The patient's family history includes Breast cancer in her mother; COPD in her mother; Diabetes in her daughter; Environmental Allergies in her son and another family member; Heart disease in her maternal grandfather, maternal grandmother, mother, paternal grandfather, paternal grandmother, and sister; Hyperlipidemia in her mother; Hypertension in her mother; Kidney cancer in her father; Liver cancer in her brother; Lupus in an other family member; Rheum arthritis in her maternal grandfather. ROS:   Please see the history of present illness.    All 14 point review of systems negative except as described per history of present illness  EKGs/Labs/Other Studies Reviewed:    EKG Interpretation Date/Time:  Friday May 12 2023 11:19:02  EST Ventricular Rate:  92 PR Interval:  194 QRS Duration:  82 QT Interval:  366 QTC Calculation: 452 R Axis:   -42  Text Interpretation: Normal sinus rhythm Left axis deviation Low voltage QRS Abnormal ECG When compared with ECG of 05-Dec-2020 18:34, PREVIOUS ECG IS PRESENT Confirmed by Gypsy Balsam [phone]) on 05/12/2023 11:28:02 AM    Recent Labs: 11/25/2022: ALT 16; BUN 31; Creatinine 1.25; Hemoglobin 11.9; Platelet Count 254; Potassium 4.3; Sodium 138  Recent Lipid Panel    Component Value Date/Time   TRIG 155 (H) 05/08/2013 2023    Physical Exam:    VS:  BP 102/60 (BP Location: Left Arm, Patient Position: Sitting)   Pulse 94   Ht 4\' 11"  (1.499 m)   Wt 146 lb 9.6 oz (66.5 kg)   SpO2 95%   BMI 29.61 kg/m     Wt Readings from Last 3 Encounters:  05/12/23 146 lb 9.6 oz (66.5 kg)  01/19/23 147 lb (66.7 kg)  09/08/22 143 lb (64.9 kg)     GEN:  Well nourished, well developed in no acute distress HEENT: Normal NECK: No JVD; No carotid bruits LYMPHATICS: No lymphadenopathy CARDIAC: RRR, no murmurs, no rubs, no gallops RESPIRATORY:  Clear to auscultation without rales, wheezing or rhonchi  ABDOMEN: Soft, non-tender, non-distended MUSCULOSKELETAL:  No edema; No deformity  SKIN: Warm and dry LOWER EXTREMITIES: no swelling NEUROLOGIC:  Alert and oriented x 3 PSYCHIATRIC:  Normal affect   ASSESSMENT:    1. Primary hypertension   2. Chronic systolic congestive heart failure (HCC)   3. Dilated cardiomyopathy (HCC)   4. Atrial fibrillation with RVR (HCC)   5. DOE (dyspnea on exertion)  6. Mixed hyperlipidemia    PLAN:    In order of problems listed above:  Essential hypertension: Blood pressure seems to well-controlled continue present management. Congestive heart failure.  Will ask her to have an echocardiogram done to recheck left ventricle ejection fraction. Paroxysmal atrial fibrillation.  Maintained sinus rhythm continue monitoring.  She is anticoagulated  which I will continue. Mixed dyslipidemia I did review K PN which show me to have lots of 155 HDL 31 she is on statin which I will continue for now.   Medication Adjustments/Labs and Tests Ordered: Current medicines are reviewed at length with the patient today.  Concerns regarding medicines are outlined above.  Orders Placed This Encounter  Procedures   EKG 12-Lead   ECHOCARDIOGRAM COMPLETE   Medication changes: No orders of the defined types were placed in this encounter.   Signed, Georgeanna Lea, MD, [facility] 05/12/2023 11:46 AM    [facility]

## 2023-05-12 NOTE — Patient Instructions (Signed)
Medication Instructions:  Your physician recommends that you continue on your current medications as directed. Please refer to the Current Medication list given to you today.  *If you need a refill on your cardiac medications before your next appointment, please call your pharmacy*   Lab Work: None Ordered If you have labs (blood work) drawn today and your tests are completely normal, you will receive your results only by: MyChart Message (if you have MyChart) OR A paper copy in the mail If you have any lab test that is abnormal or we need to change your treatment, we will call you to review the results.   Testing/Procedures: Your physician has requested that you have an echocardiogram. Echocardiography is a painless test that uses sound waves to create images of your heart. It provides your doctor with information about the size and shape of your heart and how well your heart's chambers and valves are working. This procedure takes approximately one hour. There are no restrictions for this procedure. Please do NOT wear cologne, perfume, aftershave, or lotions (deodorant is allowed). Please arrive 15 minutes prior to your appointment time.  Please note: We ask at that you not bring children with you during ultrasound (echo/ vascular) testing. Due to room size and safety concerns, children are not allowed in the ultrasound rooms during exams. Our front office staff cannot provide observation of children in our lobby area while testing is being conducted. An adult accompanying a patient to their appointment will only be allowed in the ultrasound room at the discretion of the ultrasound technician under special circumstances. We apologize for any inconvenience.    Follow-Up: At [facility], you and your health needs are our priority.  As part of our continuing mission to provide you with exceptional heart care, we have created designated Provider Care Teams.  These Care Teams include your  primary Cardiologist (physician) and Advanced Practice Providers (APPs -  Physician Assistants and Nurse Practitioners) who all work together to provide you with the care you need, when you need it.  We recommend signing up for the patient portal called "MyChart".  Sign up information is provided on this After Visit Summary.  MyChart is used to connect with patients for Virtual Visits (Telemedicine).  Patients are able to view lab/test results, encounter notes, upcoming appointments, etc.  Non-urgent messages can be sent to your provider as well.   To learn more about what you can do with MyChart, go to [email or web address].    Your next appointment:   6 month(s)  The format for your next appointment:   In Person  Provider:   Gypsy Balsam, MD    Other Instructions NA

## 2023-05-19 ENCOUNTER — Other Ambulatory Visit (HOSPITAL_COMMUNITY): Payer: Self-pay

## 2023-06-05 ENCOUNTER — Other Ambulatory Visit (HOSPITAL_COMMUNITY): Payer: Self-pay

## 2023-06-05 MED ORDER — GLIPIZIDE ER 10 MG PO TB24
10.0000 mg | ORAL_TABLET | Freq: Two times a day (BID) | ORAL | 3 refills | Status: AC
  Filled 2023-06-05: qty 180, 90d supply, fill #0

## 2023-06-06 ENCOUNTER — Other Ambulatory Visit (HOSPITAL_COMMUNITY): Payer: Self-pay

## 2023-06-06 ENCOUNTER — Ambulatory Visit: Payer: Medicare Other | Attending: Cardiology

## 2023-06-06 DIAGNOSIS — R0609 Other forms of dyspnea: Secondary | ICD-10-CM | POA: Diagnosis not present

## 2023-06-06 LAB — ECHOCARDIOGRAM COMPLETE
Calc EF: 48.8 %
S' Lateral: 3 cm
Single Plane A2C EF: 40.7 %
Single Plane A4C EF: 54.1 %

## 2023-06-07 ENCOUNTER — Other Ambulatory Visit (HOSPITAL_COMMUNITY): Payer: Self-pay

## 2023-06-08 ENCOUNTER — Telehealth: Payer: Self-pay

## 2023-06-08 NOTE — Telephone Encounter (Signed)
LVM per DPR- per Dr. Krasowski's note regarding normal Echo results. Encouraged to call with any questions. Routed to PCP. 

## 2023-06-20 ENCOUNTER — Other Ambulatory Visit (HOSPITAL_COMMUNITY): Payer: Self-pay

## 2023-07-09 ENCOUNTER — Other Ambulatory Visit: Payer: Self-pay | Admitting: Cardiology

## 2023-07-18 ENCOUNTER — Other Ambulatory Visit (HOSPITAL_COMMUNITY): Payer: Self-pay

## 2023-08-07 ENCOUNTER — Other Ambulatory Visit (HOSPITAL_COMMUNITY): Payer: Self-pay

## 2023-08-17 ENCOUNTER — Other Ambulatory Visit (HOSPITAL_COMMUNITY): Payer: Self-pay

## 2023-08-30 ENCOUNTER — Other Ambulatory Visit (HOSPITAL_COMMUNITY): Payer: Self-pay

## 2023-09-01 ENCOUNTER — Other Ambulatory Visit (HOSPITAL_COMMUNITY): Payer: Self-pay

## 2023-09-01 MED ORDER — TRULICITY 4.5 MG/0.5ML ~~LOC~~ SOAJ
SUBCUTANEOUS | 5 refills | Status: DC
  Filled 2023-09-01: qty 2, 28d supply, fill #0
  Filled 2023-10-23 – 2023-10-24 (×2): qty 2, 28d supply, fill #1
  Filled 2023-11-22 (×2): qty 2, 28d supply, fill #2
  Filled 2023-12-27: qty 2, 28d supply, fill #3
  Filled 2024-01-26: qty 2, 28d supply, fill #4

## 2023-10-14 ENCOUNTER — Other Ambulatory Visit: Payer: Self-pay | Admitting: Cardiology

## 2023-10-17 ENCOUNTER — Other Ambulatory Visit: Payer: Self-pay | Admitting: Hematology & Oncology

## 2023-10-17 DIAGNOSIS — Z1231 Encounter for screening mammogram for malignant neoplasm of breast: Secondary | ICD-10-CM

## 2023-10-24 ENCOUNTER — Other Ambulatory Visit (HOSPITAL_COMMUNITY): Payer: Self-pay

## 2023-11-02 ENCOUNTER — Ambulatory Visit
Admission: RE | Admit: 2023-11-02 | Discharge: 2023-11-02 | Disposition: A | Discharge status: Home / Self Care | Source: Ambulatory Visit | Attending: Hematology & Oncology | Admitting: Hematology & Oncology

## 2023-11-02 DIAGNOSIS — Z1231 Encounter for screening mammogram for malignant neoplasm of breast: Secondary | ICD-10-CM

## 2023-11-03 ENCOUNTER — Inpatient Hospital Stay

## 2023-11-03 ENCOUNTER — Inpatient Hospital Stay (HOSPITAL_BASED_OUTPATIENT_CLINIC_OR_DEPARTMENT_OTHER): Admitting: Family

## 2023-11-03 ENCOUNTER — Inpatient Hospital Stay: Attending: Hematology & Oncology

## 2023-11-03 ENCOUNTER — Encounter: Payer: Self-pay | Admitting: Family

## 2023-11-03 VITALS — BP 130/76 | HR 77 | Temp 98.4°F | Resp 19 | Ht 59.0 in | Wt 148.4 lb

## 2023-11-03 DIAGNOSIS — N183 Chronic kidney disease, stage 3 unspecified: Secondary | ICD-10-CM

## 2023-11-03 DIAGNOSIS — D509 Iron deficiency anemia, unspecified: Secondary | ICD-10-CM | POA: Diagnosis present

## 2023-11-03 DIAGNOSIS — D631 Anemia in chronic kidney disease: Secondary | ICD-10-CM

## 2023-11-03 DIAGNOSIS — D508 Other iron deficiency anemias: Secondary | ICD-10-CM

## 2023-11-03 DIAGNOSIS — Z853 Personal history of malignant neoplasm of breast: Secondary | ICD-10-CM | POA: Insufficient documentation

## 2023-11-03 DIAGNOSIS — K909 Intestinal malabsorption, unspecified: Secondary | ICD-10-CM | POA: Insufficient documentation

## 2023-11-03 DIAGNOSIS — Z08 Encounter for follow-up examination after completed treatment for malignant neoplasm: Secondary | ICD-10-CM | POA: Diagnosis not present

## 2023-11-03 LAB — CBC WITH DIFFERENTIAL (CANCER CENTER ONLY)
Abs Immature Granulocytes: 0.03 10*3/uL (ref 0.00–0.07)
Basophils Absolute: 0.1 10*3/uL (ref 0.0–0.1)
Basophils Relative: 1 %
Eosinophils Absolute: 0.6 10*3/uL — ABNORMAL HIGH (ref 0.0–0.5)
Eosinophils Relative: 7 %
HCT: 33.3 % — ABNORMAL LOW (ref 36.0–46.0)
Hemoglobin: 11.5 g/dL — ABNORMAL LOW (ref 12.0–15.0)
Immature Granulocytes: 0 %
Lymphocytes Relative: 35 %
Lymphs Abs: 3 10*3/uL (ref 0.7–4.0)
MCH: 31.8 pg (ref 26.0–34.0)
MCHC: 34.5 g/dL (ref 30.0–36.0)
MCV: 92 fL (ref 80.0–100.0)
Monocytes Absolute: 0.5 10*3/uL (ref 0.1–1.0)
Monocytes Relative: 6 %
Neutro Abs: 4.3 10*3/uL (ref 1.7–7.7)
Neutrophils Relative %: 51 %
Platelet Count: 238 10*3/uL (ref 150–400)
RBC: 3.62 MIL/uL — ABNORMAL LOW (ref 3.87–5.11)
RDW: 13.3 % (ref 11.5–15.5)
WBC Count: 8.4 10*3/uL (ref 4.0–10.5)
nRBC: 0 % (ref 0.0–0.2)

## 2023-11-03 LAB — IRON AND IRON BINDING CAPACITY (CC-WL,HP ONLY)
Iron: 79 ug/dL (ref 28–170)
Saturation Ratios: 25 % (ref 10.4–31.8)
TIBC: 318 ug/dL (ref 250–450)
UIBC: 239 ug/dL (ref 148–442)

## 2023-11-03 LAB — CMP (CANCER CENTER ONLY)
ALT: 11 U/L (ref 0–44)
AST: 12 U/L — ABNORMAL LOW (ref 15–41)
Albumin: 4.6 g/dL (ref 3.5–5.0)
Alkaline Phosphatase: 63 U/L (ref 38–126)
Anion gap: 12 (ref 5–15)
BUN: 21 mg/dL (ref 8–23)
CO2: 24 mmol/L (ref 22–32)
Calcium: 9 mg/dL (ref 8.9–10.3)
Chloride: 107 mmol/L (ref 98–111)
Creatinine: 1.3 mg/dL — ABNORMAL HIGH (ref 0.44–1.00)
GFR, Estimated: 45 mL/min — ABNORMAL LOW (ref >60)
Glucose, Bld: 124 mg/dL — ABNORMAL HIGH (ref 70–99)
Potassium: 3.7 mmol/L (ref 3.5–5.1)
Sodium: 143 mmol/L (ref 135–145)
Total Bilirubin: 0.4 mg/dL (ref 0.0–1.2)
Total Protein: 7.7 g/dL (ref 6.5–8.1)

## 2023-11-03 LAB — RETICULOCYTES
Immature Retic Fract: 15.3 % (ref 2.3–15.9)
RBC.: 3.59 MIL/uL — ABNORMAL LOW (ref 3.87–5.11)
Retic Count, Absolute: 86.2 10*3/uL (ref 19.0–186.0)
Retic Ct Pct: 2.4 % (ref 0.4–3.1)

## 2023-11-03 LAB — FERRITIN: Ferritin: 189 ng/mL (ref 11–307)

## 2023-11-03 NOTE — Progress Notes (Signed)
 Hematology and Oncology Follow Up Visit  [name] [id number] [birth year] 68 y.o. 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 She is feeling pretty worm out. She has been caring for her husband who has Parkinson's and had to have an AKA several months ago. Thankfully he is recuperating nicely.  She has not noted any blood loss. No bruising or petechiae.  No fever, chills, n/v, cough, rash, dizziness, SOB, chest pain, palpitations, abdominal pain or changes in bowel or bladder habits.  No swelling in her extremities.  She does have tingling in her fingertips associated with neck issues that comes and goes.  No falls or syncope reported.  Appetite and hydration are good. Weight is stable at 148 lbs.   ECOG Performance Status: 1 - Symptomatic but completely ambulatory  Medications:  Allergies as of 11/03/2023       Reactions   Pneumococcal Vaccines Other (See Comments)   Really high fever, and flu symptoms. MD instructed not to give   Shellfish-derived Products Anaphylaxis, Shortness Of Breath, Nausea And Vomiting   Patient stated she is allergic to "shrimp," if she eats "a lot". Swelling of esophagus   Cleocin [clindamycin Hcl] Other (See Comments)   "irritated my esophagus"   Morphine And Codeine  Nausea And Vomiting   Other Nausea And Vomiting, Swelling   Shrimp if eat a lot   Ativan  [lorazepam ] Other (See Comments)   Difficulty waking up   Buprenorphine Hcl Nausea And Vomiting   Pt does not know if she has ever had this medication        Medication List        Accurate as of Nov 03, 2023 10:37 AM. If you have any questions, ask your nurse or doctor.           acetaminophen  500 MG tablet Commonly known as: TYLENOL  Take 500 mg by mouth every 6 (six) hours as needed for mild pain, headache or fever.   albuterol  108 (90 Base) MCG/ACT inhaler Commonly known as: VENTOLIN  HFA Inhale 2 puffs into the lungs every 6 (six) hours as needed for wheezing or shortness of breath.   amLODipine  2.5 MG tablet Commonly known as: NORVASC  Take 1 tablet (2.5 mg total) by mouth daily.   ammonium lactate 12 % lotion Commonly known as: LAC-HYDRIN Apply 1 application topically daily.   atorvastatin  10 MG tablet Commonly known as: LIPITOR Take 10 mg by mouth at bedtime.   carvedilol  6.25 MG tablet Commonly known as: COREG  TAKE 1 TABLET BY MOUTH 2 TIMES DAILY WITH A MEAL.   cholecalciferol  25 MCG (1000 UNIT) tablet Commonly known as: VITAMIN D3 Take 1,000 Units by mouth in the morning.   Eliquis  5 MG Tabs tablet Generic drug: apixaban  TAKE 1 TABLET BY MOUTH TWICE A DAY What changed: how much to take   Entresto  24-26 MG Generic drug: sacubitril -valsartan  TAKE 1 TABLET BY MOUTH TWICE A DAY   fluticasone  50 MCG/ACT nasal spray Commonly known as: FLONASE  Place 1 spray into both nostrils daily as needed for allergies or rhinitis.   furosemide  20 MG tablet Commonly known as: LASIX  Take 20 mg by mouth daily as needed for edema (  weight gain of 2 lbs or more x 2 days).   glipiZIDE  10 MG 24 hr tablet Commonly known as: GLUCOTROL  XL Take 1 tablet (10 mg total) by mouth with food 2 (two) times daily. What changed: Another medication with the same name was removed. Continue taking this medication, and follow the directions you see here. Changed by: Kennard Pea   loratadine  10 MG tablet Commonly known as: CLARITIN  Take 10 mg by mouth in the morning.   MELATONIN PO Take 1 tablet by mouth at bedtime.   montelukast  10 MG tablet Commonly known as: SINGULAIR  Take 10 mg by mouth at bedtime.   omeprazole  20 MG capsule Commonly known as: PRILOSEC Take  20 mg by mouth in the morning.   ondansetron  4 MG disintegrating tablet Commonly known as: ZOFRAN -ODT Take 1 tablet (4 mg total) by mouth every 8 (eight) hours as needed for nausea or vomiting.   sertraline  50 MG tablet Commonly known as: ZOLOFT  Take 50 mg by mouth in the morning.   traMADol  50 MG tablet Commonly known as: ULTRAM  Take 1 tablet by mouth every 6 (six) hours.   Trulicity  4.5 MG/0.5ML Soaj Generic drug: Dulaglutide  Inject 4.5 mg under the skin once weekly What changed: Another medication with the same name was removed. Continue taking this medication, and follow the directions you see here. Changed by: Kennard Pea   vitamin C  1000 MG tablet Take 1,000 mg by mouth in the morning.   VITAMIN E PO Take 1 capsule by mouth daily. Unknown strength   Zinc 50 MG Tabs Take 50 mg by mouth in the morning.        Allergies:  Allergies  Allergen Reactions   Pneumococcal Vaccines Other (See Comments)    Really high fever, and flu symptoms. MD instructed not to give   Shellfish-Derived Products Anaphylaxis, Shortness Of Breath and Nausea And Vomiting    Patient stated she is allergic to "shrimp," if she eats "a lot". Swelling of esophagus   Cleocin [Clindamycin Hcl] Other (See Comments)    "irritated my esophagus"   Morphine And Codeine  Nausea And Vomiting   Other Nausea And Vomiting and Swelling    Shrimp if eat a lot   Ativan  [Lorazepam ] Other (See Comments)    Difficulty waking up    Buprenorphine Hcl Nausea And Vomiting    Pt does not know if she has ever had this medication    Past Medical History, Surgical history, Social history, and Family History were reviewed and updated.  Review of Systems: All other 10 point review of systems is negative.   Physical Exam:  height is 4\' 11"  (1.499 m) and weight is 148 lb 6.4 oz (67.3 kg). Her oral temperature is 98.4 F (36.9 C). Her blood pressure is 130/76 and her pulse is 77. Her respiration is 19 and oxygen  saturation is 100%.   Wt Readings from Last 3 Encounters:  11/03/23 148 lb 6.4 oz (67.3 kg)  05/12/23 146 lb 9.6 oz (66.5 kg)  01/19/23 147 lb (66.7 kg)    Ocular: Sclerae unicteric, pupils equal, round and reactive to light Ear-nose-throat: Oropharynx clear, dentition fair Lymphatic: No cervical or supraclavicular adenopathy Lungs no rales or rhonchi, good excursion bilaterally Heart regular rate and rhythm, no murmur appreciated Abd soft, nontender, positive bowel sounds MSK no focal spinal tenderness, no joint edema Neuro: non-focal, well-oriented, appropriate affect Breasts: Deferred   Lab Results  Component Value Date   WBC 10.2 11/25/2022   HGB 11.9 (  L) 11/25/2022   HCT 36.0 11/25/2022   MCV 92.1 11/25/2022   PLT 254 11/25/2022   Lab Results  Component Value Date   FERRITIN 175 11/25/2022   IRON 75 11/25/2022   TIBC 332 11/25/2022   UIBC 257 11/25/2022   IRONPCTSAT 23 11/25/2022   Lab Results  Component Value Date   RETICCTPCT 2.1 11/25/2022   RBC 3.96 11/25/2022   RBC 3.91 11/25/2022   No results found for: "KPAFRELGTCHN", "LAMBDASER", "KAPLAMBRATIO" Lab Results  Component Value Date   IGGSERUM 789 11/15/2016   IGA 150 11/15/2016   IGMSERUM 86 11/15/2016   No results found for: "TOTALPROTELP", "ALBUMINELP", "A1GS", "A2GS", "BETS", "BETA2SER", "GAMS", "MSPIKE", "SPEI"   Chemistry      Component Value Date/Time   NA 143 11/03/2023 1000   NA 143 10/14/2022 1202   NA 144 04/17/2017 1150   NA 141 04/18/2016 1256   K 3.7 11/03/2023 1000   K 4.6 04/17/2017 1150   K 4.4 04/18/2016 1256   CL 107 11/03/2023 1000   CL 109 (H) 04/17/2017 1150   CO2 24 11/03/2023 1000   CO2 23 04/17/2017 1150   CO2 18 (L) 04/18/2016 1256   BUN 21 11/03/2023 1000   BUN 24 10/14/2022 1202   BUN 28 (H) 04/17/2017 1150   BUN 27.3 (H) 04/18/2016 1256   CREATININE 1.30 (H) 11/03/2023 1000   CREATININE 1.3 (H) 04/17/2017 1150   CREATININE 1.4 (H) 04/18/2016 1256       Component Value Date/Time   CALCIUM  9.0 11/03/2023 1000   CALCIUM  9.6 04/17/2017 1150   CALCIUM  9.4 04/18/2016 1256   ALKPHOS 63 11/03/2023 1000   ALKPHOS 62 04/17/2017 1150   ALKPHOS 68 04/18/2016 1256   AST 12 (L) 11/03/2023 1000   AST 14 04/18/2016 1256   ALT 11 11/03/2023 1000   ALT 20 04/17/2017 1150   ALT 23 04/18/2016 1256   BILITOT 0.4 11/03/2023 1000   BILITOT <0.22 04/18/2016 1256       Impression and Plan: Ms. Flagler is a very pleasant 68 yo caucasian female with remote history of stage I ductal carcinoma of the left breast diagnosed in 2003 as well as iron deficiency.  Iron studies are pending.  No ESA needed, Hgb 11.5.  Follow-up in 6 months.    Kennard Pea, NP 5/2/202510:37 AM

## 2023-11-22 ENCOUNTER — Other Ambulatory Visit (HOSPITAL_COMMUNITY): Payer: Self-pay

## 2023-11-22 DIAGNOSIS — E871 Hypo-osmolality and hyponatremia: Secondary | ICD-10-CM | POA: Insufficient documentation

## 2023-11-22 DIAGNOSIS — R400 Somnolence: Secondary | ICD-10-CM | POA: Insufficient documentation

## 2023-11-22 DIAGNOSIS — Z8619 Personal history of other infectious and parasitic diseases: Secondary | ICD-10-CM | POA: Insufficient documentation

## 2023-11-22 DIAGNOSIS — M79642 Pain in left hand: Secondary | ICD-10-CM | POA: Insufficient documentation

## 2023-11-22 DIAGNOSIS — R809 Proteinuria, unspecified: Secondary | ICD-10-CM | POA: Insufficient documentation

## 2023-11-22 DIAGNOSIS — G4733 Obstructive sleep apnea (adult) (pediatric): Secondary | ICD-10-CM | POA: Insufficient documentation

## 2023-11-22 DIAGNOSIS — M79641 Pain in right hand: Secondary | ICD-10-CM | POA: Insufficient documentation

## 2023-11-22 DIAGNOSIS — E1169 Type 2 diabetes mellitus with other specified complication: Secondary | ICD-10-CM | POA: Insufficient documentation

## 2023-11-22 DIAGNOSIS — M3500 Sicca syndrome, unspecified: Secondary | ICD-10-CM | POA: Insufficient documentation

## 2023-11-23 ENCOUNTER — Ambulatory Visit: Attending: Cardiology

## 2023-11-23 ENCOUNTER — Ambulatory Visit: Attending: Cardiology | Admitting: Cardiology

## 2023-11-23 VITALS — BP 120/74 | HR 95 | Ht 59.0 in | Wt 150.6 lb

## 2023-11-23 DIAGNOSIS — E669 Obesity, unspecified: Secondary | ICD-10-CM | POA: Insufficient documentation

## 2023-11-23 DIAGNOSIS — I10 Essential (primary) hypertension: Secondary | ICD-10-CM | POA: Diagnosis present

## 2023-11-23 DIAGNOSIS — E1169 Type 2 diabetes mellitus with other specified complication: Secondary | ICD-10-CM | POA: Diagnosis present

## 2023-11-23 DIAGNOSIS — I4891 Unspecified atrial fibrillation: Secondary | ICD-10-CM | POA: Diagnosis present

## 2023-11-23 DIAGNOSIS — R0609 Other forms of dyspnea: Secondary | ICD-10-CM | POA: Diagnosis present

## 2023-11-23 DIAGNOSIS — I420 Dilated cardiomyopathy: Secondary | ICD-10-CM | POA: Diagnosis present

## 2023-11-23 NOTE — Progress Notes (Signed)
 Cardiology Office Note:    Date:  11/23/2023   ID:  Jalise, Zawistowski 1956-05-17, MRN [id number]  PCP:  Lucius Sabins., MD  Cardiologist:  Ralene Burger, MD    Referring MD: Lucius Sabins., MD   Chief Complaint  Patient presents with   Follow-up    History of Present Illness:    [name] is a 68 y.o. female past medical history significant for paroxysmal atrial fibrillation CHA2DS2-VASc equals 4 she is anticoagulated status post atrial fibrillation ablation done in 2022, sadly that the procedure has been complicated by a retroperitoneal bleed and after that she still gets some episode of atrial fibrillation.  Additional problem clued mild to moderate mitral valve regurgitation, mildly reduced left ventricular ejection fraction.  Also essential hypertension diabetes dyslipidemia.  Comes today to my office for follow-up complain of having some palpitations that interestingly happen typically at rest.  When she dusting when she is busy she does not feel it.  Described to have also some more shortness of breath than previously.  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 difficile colitis    Calculus of kidney 08/12/2012   CHF (congestive heart failure) (HCC)    "mild" (05/15/2013)   CHF (congestive heart failure) (HCC)    "mild" (05/15/2013)   Chronic bronchitis (HCC)    Chronic lower back pain    Chronic non-seasonal allergic rhinitis 11/15/2016   Chronic sphenoidal sinusitis 02/08/2022   Chronic  systolic congestive heart failure (HCC)    Chronic UTI    Cough variant asthma vs uacs  07/24/2017   Onset in her 20s some better p rx with allergy  shots in her 30s Singulair  trial 11/15/16 >  Improved 01/18/17:  FEV1 1.29 L (61%)  Ratio 75  Still on ACEi as of 07/24/2017  Spirometry 06/28/18   FEV1 1.3 (61%)  Ratio 77 with min curvature   - Allergy  profile 07/26/2018 >  Eos 1.2 /  IgE  420  RAST Pos mold only    Cough variant asthma vs uacs  07/24/2017   Onset in her 20s some better p rx with allergy  shots in her 30s Singulair  trial 11/15/16 >  Improved 01/18/17:  FEV1 1.29 L (61%)  Ratio 75  Still on ACEi as of 07/24/2017  Spirometry 06/28/18   FEV1 1.3 (61%)  Ratio 77 with min curvature   - Allergy  profile 07/26/2018 >  Eos 1.2 /  IgE  420  RAST Pos mold only    Diabetes mellitus type 2 in obese    Diarrhea 11/15/2016   Dilated cardiomyopathy (HCC) 07/16/2019   DOE (dyspnea on exertion) 07/27/2018   Onset early 2019 assoc with uacs while on ACEi - 06/28/18    Walked RA x one lap =  approx 250 ft - stopped due to sob with sats still high 90's      Dysphagia    Dysrhythmia    atrial fib/dr [facility]   Family history of anesthesia complication    "  my mother also had PONV" (05/15/2013)   Fever 10/29/2017   Foot pain 04/09/2018   GERD (gastroesophageal reflux disease)    Heart murmur    High cholesterol    History of breast cancer in female 07/19/2013   HTN (hypertension) 05/08/2013   Hydronephrosis of right kidney    Hyperlipidemia 11/15/2016   Hypomagnesemia    Hyponatremia 10/29/2017   IDA (iron deficiency anemia) 04/19/2017   Infection    Kidney stones    Left breast mass 07/19/2013   Low back pain 05/01/2018   Migraine    "haven't had one in the early 2000's" (05/15/2013)   Mitral regurgitation 09/03/2020   Mixed incontinence 07/24/2018   Nephrolithiasis 11/15/2016   Other chronic cystitis 05/19/2011   PAF (paroxysmal atrial fibrillation) (HCC)    Personal  history of chemotherapy    Personal history of radiation therapy    Pneumonia    "used to be chronic; last time I had it was 2013" (05/15/2013)   PONV (postoperative nausea and vomiting)    Recurrent sinusitis 09/08/2016   Recurrent UTI 11/20/2014   Recurrent UTI (urinary tract infection)    "from the continuous kidney stones; take Macrodantin  qd" (05/15/2013)   Referred otalgia of left ear 04/22/2021   Respiratory failure requiring intubation (HCC) 05/08/2013   Restrictive lung disease 01/18/2017   Retroperitoneal bleed 12/05/2020   Sacroiliac joint pain 05/16/2018   Sepsis (HCC) 2005   kidney stone infection   Septic shock (HCC) 09/23/2018   Simple chronic bronchitis (HCC) 11/15/2016   Strep throat 10/29/2017   Tachypnea    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   Tension headache    Type II diabetes mellitus (HCC)    Type II or unspecified type diabetes mellitus without mention of complication, not stated as uncontrolled 05/11/2013   Urinary tract stones 05/19/2011   Urinary urgency 07/24/2018    Past Surgical History:  Procedure Laterality Date   ATRIAL FIBRILLATION ABLATION N/A 11/25/2020   Procedure: ATRIAL FIBRILLATION ABLATION;  Surgeon: Lei Pump, MD;  Location: [facility];  Service: Cardiovascular;  Laterality: N/A;   BILATERAL OOPHORECTOMY Bilateral 2006   "cause I needed to 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" (05/15/2013)   BREAST BIOPSY Left 02/2001   BREAST LUMPECTOMY Left 02/2001   BREAST LUMPECTOMY Left 09/23/2013   Procedure: LEFT LUMPECTOMY WITH SPECIMEN MAMMOGRAM;  Surgeon: Levert Ready, MD;  Location: [facility];  Service: General;  Laterality: Left;   BUBBLE STUDY  11/24/2020   Procedure: BUBBLE STUDY;  Surgeon: Harrold Lincoln, MD;  Location: [facility];  Service: Cardiovascular;;   COLONOSCOPY  05/16/2014   Colonic polyp status post polypectomy. Minimal sigmoid diverticulosis. Small  internal hemorrhoids. Otherwise normal colonoscopy to terminal ileum.   DIAGNOSTIC LAPAROSCOPY     cyst-near ovary   ESOPHAGOGASTRODUODENOSCOPY  11/25/2016   Gastric polyp(s) Otherwise normal duoedenoscopy examination   KNEE ARTHROSCOPY Right    Menicus Tear   LITHOTRIPSY     "2-3 times prior to 2002" (05/15/2013)   SPINAL FUSION N/A 05/08/2013   Procedure: T9-S1 INSTRUMENTED FUSION T12 -S1 DECOMPRESSION;  Surgeon: Mort Ards, MD;  Location: [facility] OR;  Service: Orthopedics;  Laterality: N/A;   TEE WITHOUT CARDIOVERSION N/A 11/06/2020   Procedure: TRANSESOPHAGEAL ECHOCARDIOGRAM (TEE);  Surgeon: Elmyra Haggard, MD;  [facility];  Service: Cardiovascular;  Laterality: N/A;   TEE WITHOUT CARDIOVERSION N/A 11/24/2020   Procedure: TRANSESOPHAGEAL ECHOCARDIOGRAM (TEE);  Surgeon: Harrold Lincoln, MD;  Location: [facility];  Service: Cardiovascular;  Laterality: N/A;   TUBAL LIGATION Bilateral 1985    Current Medications: Current Meds  Medication Sig   acetaminophen  (TYLENOL ) 500 MG tablet Take 500 mg by mouth every 6 (six) hours as needed for mild pain, headache or fever.   albuterol  (VENTOLIN  HFA) 108 (90 Base) MCG/ACT inhaler Inhale 2 puffs into the lungs every 6 (six) hours as needed for wheezing or shortness of breath.   amLODipine  (NORVASC ) 2.5 MG tablet Take 1 tablet (2.5 mg total) by mouth daily.   ammonium lactate (LAC-HYDRIN) 12 % lotion Apply 1 application topically daily.   Ascorbic Acid  (VITAMIN C ) 1000 MG tablet Take 1,000 mg by mouth in the morning.   atorvastatin  (LIPITOR) 10 MG tablet Take 10 mg by mouth at bedtime.   carvedilol  (COREG ) 6.25 MG tablet TAKE 1 TABLET BY MOUTH 2 TIMES DAILY WITH A MEAL.   cholecalciferol  (VITAMIN D ) 25 MCG (1000 UNIT) tablet Take 1,000 Units by mouth in the morning.   ELIQUIS  5 MG TABS tablet TAKE 1 TABLET BY MOUTH TWICE A DAY (Patient taking differently: Take 5 mg by mouth 2 (two) times daily.)   ENTRESTO  24-26 MG TAKE 1 TABLET  BY MOUTH TWICE A DAY   fluticasone  (FLONASE ) 50 MCG/ACT nasal spray Place 1 spray into both nostrils daily as needed for allergies or rhinitis.   furosemide  (LASIX ) 20 MG tablet Take 20 mg by mouth daily as needed for edema (weight gain of 2 lbs or more x 2 days).   glipiZIDE  (GLUCOTROL  XL) 10 MG 24 hr tablet Take 1 tablet (10 mg total) by mouth with food 2 (two) times daily.   loratadine  (CLARITIN ) 10 MG tablet Take 10 mg by mouth in the morning.   MELATONIN PO Take 1 tablet by mouth at bedtime.   montelukast  (SINGULAIR ) 10 MG tablet Take 10 mg by mouth at bedtime.   omeprazole  (PRILOSEC) 20 MG capsule Take 20 mg by mouth in the morning.   ondansetron  (ZOFRAN -ODT) 4 MG disintegrating tablet Take 1 tablet (4 mg total) by mouth every 8 (eight) hours as needed for nausea or vomiting.   sertraline  (ZOLOFT ) 50 MG tablet Take 50 mg by mouth in the morning.   traMADol  (ULTRAM ) 50 MG tablet Take 1 tablet by mouth every 6 (six) hours.   TRULICITY  4.5 MG/0.5ML SOAJ Inject 4.5 mg under the skin once weekly (Patient taking differently: Inject 4.5 mg into the skin once a week.)   VITAMIN E PO Take 1 capsule by mouth daily. Unknown strength   Zinc 50 MG TABS Take 50 mg by mouth in the morning.     Allergies:   Pneumococcal vaccines, Shellfish-derived products, Cleocin [clindamycin hcl], Morphine and codeine , Other, Ativan  [lorazepam ], and Buprenorphine hcl   Social History   Socioeconomic History   Marital status: Married    Spouse name: Not on file   Number of children: 2   Years of education: Not on file   Highest education level: Not on file  Occupational History   Occupation: retired  Tobacco Use   Smoking status: Never   Smokeless tobacco: Never   Tobacco comments:    Mother & Father smoked  Vaping Use   Vaping status: Never Used  Substance and Sexual Activity   Alcohol use: No    Alcohol/week: 0.0 standard drinks of alcohol   Drug use: No   Sexual activity: Yes  Other Topics Concern    Not on file  Social History Narrative  [facility] (11/15/16):   Patient's originally from [place] . Has always lived in [place] . Currently has an outside dog. Remote exposure to [place] when her children were young. No known mold exposure. 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 Worked primarily for the [employer].    Social Drivers of Corporate investment banker Strain: Not on file  Food Insecurity: No Food Insecurity (03/03/2022)   Hunger Vital Sign    Worried About Running Out of Food in the Last Year: Never true    Ran Out of Food in the Last Year: Never true  Transportation Needs: No Transportation Needs (03/03/2022)   PRAPARE - Administrator, Civil Service (Medical): No    Lack of Transportation (Non-Medical): No  Physical Activity: Not on file  Stress: Not on file  Social Connections: Not on file     Family History: The patient's family history includes Breast cancer in her mother; COPD in her mother; Diabetes in her daughter; Environmental Allergies in her son and another family member; Heart disease in her maternal grandfather, maternal grandmother, mother, paternal grandfather, paternal grandmother, and sister; Hyperlipidemia in her mother; Hypertension in her mother; Kidney cancer in her father; Liver cancer in her brother; Lupus in an other family member; Rheum arthritis in her maternal grandfather. ROS:   Please see the history of present illness.    All 14 point review of systems negative except as described per history of present illness  EKGs/Labs/Other Studies Reviewed:         Recent Labs: 11/03/2023: ALT 11; BUN 21; Creatinine 1.30; Hemoglobin 11.5; Platelet Count 238; Potassium 3.7; Sodium 143  Recent Lipid Panel    Component Value Date/Time   TRIG 155 (H) 05/08/2013 2023    Physical Exam:    VS:  BP 120/74 (BP Location: Right Arm, Patient Position: Sitting)   Pulse 95   Ht 4\' 11"  (1.499 m)   Wt 150 lb 9.6 oz (68.3  kg)   SpO2 93%   BMI 30.42 kg/m     Wt Readings from Last 3 Encounters:  11/23/23 150 lb 9.6 oz (68.3 kg)  11/03/23 148 lb 6.4 oz (67.3 kg)  05/12/23 146 lb 9.6 oz (66.5 kg)     GEN:  Well nourished, well developed in no acute distress HEENT: Normal NECK: No JVD; No carotid bruits LYMPHATICS: No lymphadenopathy CARDIAC: RRR, soft systolic grade 2 out of 6 pressure left border sternum, no rubs, no gallops RESPIRATORY:  Clear to auscultation without rales, wheezing or rhonchi  ABDOMEN: Soft, non-tender, non-distended MUSCULOSKELETAL:  No edema; No deformity  SKIN: Warm and dry LOWER EXTREMITIES: no swelling NEUROLOGIC:  Alert and oriented x 3 PSYCHIATRIC:  Normal affect   ASSESSMENT:    1. Dilated cardiomyopathy (HCC)   2. Primary hypertension   3. Type 2 diabetes mellitus with obesity (HCC)    PLAN:    In order of problems listed above:  History of dilated cardiomyopathy on guideline directed medical therapy, will ask her to have another echocardiogram to recheck left ventricle ejection fraction. Essential hypertension blood pressure well-controlled. Paroxysmal atrial fibrillation today sinus rhythm will schedule her to have Zio patch to see frequency and burden of atrial fibrillation.  Continue anticoagulation in the meantime. Type 2 diabetes therapy followed by antimedicine team. Dyslipidemia she is taking Lipitor 10.  I get last fasting lipid profile from October total cholesterol 155 HDL 31 we will continue present management   Medication Adjustments/Labs and Tests Ordered: Current medicines  are reviewed at length with the patient today.  Concerns regarding medicines are outlined above.  No orders of the defined types were placed in this encounter.  Medication changes: No orders of the defined types were placed in this encounter.   Signed, Manfred Seed, MD, [facility] 11/23/2023 1:38 PM    [facility]

## 2023-11-23 NOTE — Patient Instructions (Addendum)
 Medication Instructions:  Your physician recommends that you continue on your current medications as directed. Please refer to the Current Medication list given to you today.  *If you need a refill on your cardiac medications before your next appointment, please call your pharmacy*   Lab Work: None Ordered If you have labs (blood work) drawn today and your tests are completely normal, you will receive your results only by: MyChart Message (if you have MyChart) OR A paper copy in the mail If you have any lab test that is abnormal or we need to change your treatment, we will call you to review the results.   Testing/Procedures: Your physician has requested that you have an echocardiogram. Echocardiography is a painless test that uses sound waves to create images of your heart. It provides your doctor with information about the size and shape of your heart and how well your heart's chambers and valves are working. This procedure takes approximately one hour. There are no restrictions for this procedure. Please do NOT wear cologne, perfume, aftershave, or lotions (deodorant is allowed). Please arrive 15 minutes prior to your appointment time.  Please note: We ask at that you not bring children with you during ultrasound (echo/ vascular) testing. Due to room size and safety concerns, children are not allowed in the ultrasound rooms during exams. Our front office staff cannot provide observation of children in our lobby area while testing is being conducted. An adult accompanying a patient to their appointment will only be allowed in the ultrasound room at the discretion of the ultrasound technician under special circumstances. We apologize for any inconvenience.   WHY IS MY DOCTOR PRESCRIBING ZIO? The Zio system is proven and trusted by physicians to detect and diagnose irregular heart rhythms -- and has been prescribed to hundreds of thousands of patients.  The FDA has cleared the Zio system to  monitor for many different kinds of irregular heart rhythms. In a study, physicians were able to reach a diagnosis 90% of the time with the Zio system1.  You can wear the Zio monitor -- a small, discreet, comfortable patch -- during your normal day-to-day activity, including while you sleep, shower, and exercise, while it records every single heartbeat for analysis.  1Barrett, P., et al. Comparison of 24 Hour Holter Monitoring Versus 14 Day Novel Adhesive Patch Electrocardiographic Monitoring. American Journal of Medicine, 2014.  ZIO VS. HOLTER MONITORING The Zio monitor can be comfortably worn for up to 14 days. Holter monitors can be worn for 24 to 48 hours, limiting the time to record any irregular heart rhythms you may have. Zio is able to capture data for the 51% of patients who have their first symptom-triggered arrhythmia after 48 hours.1  LIVE WITHOUT RESTRICTIONS The Zio ambulatory cardiac monitor is a small, unobtrusive, and water-resistant patch--you might even forget you're wearing it. The Zio monitor records and stores every beat of your heart, whether you're sleeping, working out, or showering.     Follow-Up: At [facility], you and your health needs are our priority.  As part of our continuing mission to provide you with exceptional heart care, we have created designated Provider Care Teams.  These Care Teams include your primary Cardiologist (physician) and Advanced Practice Providers (APPs -  Physician Assistants and Nurse Practitioners) who all work together to provide you with the care you need, when you need it.  We recommend signing up for the patient portal called "MyChart".  Sign up information is provided on this After  Visit Summary.  MyChart is used to connect with patients for Virtual Visits (Telemedicine).  Patients are able to view lab/test results, encounter notes, upcoming appointments, etc.  Non-urgent messages can be sent to your provider as well.   To learn more  about what you can do with MyChart, go to [email or web address].    Your next appointment:   6 month(s)  The format for your next appointment:   In Person  Provider:   Gypsy Balsam, MD    Other Instructions NA

## 2023-12-04 ENCOUNTER — Other Ambulatory Visit: Payer: Self-pay | Admitting: Cardiology

## 2023-12-18 ENCOUNTER — Other Ambulatory Visit (HOSPITAL_BASED_OUTPATIENT_CLINIC_OR_DEPARTMENT_OTHER): Payer: Self-pay | Admitting: Family Medicine

## 2023-12-18 DIAGNOSIS — Z13820 Encounter for screening for osteoporosis: Secondary | ICD-10-CM

## 2023-12-18 DIAGNOSIS — M8589 Other specified disorders of bone density and structure, multiple sites: Secondary | ICD-10-CM

## 2023-12-20 ENCOUNTER — Ambulatory Visit: Attending: Cardiology

## 2023-12-20 DIAGNOSIS — R0609 Other forms of dyspnea: Secondary | ICD-10-CM | POA: Insufficient documentation

## 2023-12-20 LAB — ECHOCARDIOGRAM COMPLETE
Area-P 1/2: 3.47 cm2
MV M vel: 3.34 m/s
MV Peak grad: 44.6 mmHg
S' Lateral: 2.7 cm

## 2023-12-22 ENCOUNTER — Ambulatory Visit (HOSPITAL_BASED_OUTPATIENT_CLINIC_OR_DEPARTMENT_OTHER)
Admission: RE | Admit: 2023-12-22 | Discharge: 2023-12-22 | Disposition: A | Discharge status: Home / Self Care | Source: Ambulatory Visit | Attending: Family Medicine | Admitting: Family Medicine

## 2023-12-22 DIAGNOSIS — Z13820 Encounter for screening for osteoporosis: Secondary | ICD-10-CM | POA: Diagnosis not present

## 2023-12-22 DIAGNOSIS — M8589 Other specified disorders of bone density and structure, multiple sites: Secondary | ICD-10-CM

## 2023-12-24 ENCOUNTER — Ambulatory Visit: Payer: Self-pay | Admitting: Cardiology

## 2023-12-25 ENCOUNTER — Telehealth: Payer: Self-pay

## 2023-12-25 NOTE — Telephone Encounter (Signed)
 Left message on My Chart with Echo results per Dr. Vanetta Shawl note. Routed to PCP.

## 2023-12-27 ENCOUNTER — Other Ambulatory Visit (HOSPITAL_COMMUNITY): Payer: Self-pay

## 2024-01-01 ENCOUNTER — Telehealth: Payer: Self-pay

## 2024-01-01 DIAGNOSIS — I4891 Unspecified atrial fibrillation: Secondary | ICD-10-CM

## 2024-01-01 NOTE — Telephone Encounter (Signed)
Monitor Results reviewed with pt as per Dr. Wendy Poet note.  Pt verbalized understanding and had no additional questions. Routed to PCP

## 2024-03-02 ENCOUNTER — Other Ambulatory Visit: Payer: Self-pay | Admitting: Cardiology

## 2024-03-02 DIAGNOSIS — I480 Paroxysmal atrial fibrillation: Secondary | ICD-10-CM

## 2024-03-05 NOTE — Telephone Encounter (Signed)
 Prescription refill request for Eliquis  received. Indication:afib Last office visit:5/25 Scr:1.30  5/25 Age: 68 Weight:68.3  kg  Prescription refilled

## 2024-04-24 ENCOUNTER — Other Ambulatory Visit: Payer: Self-pay | Admitting: Cardiology

## 2024-05-06 ENCOUNTER — Inpatient Hospital Stay: Attending: Hematology & Oncology

## 2024-05-06 ENCOUNTER — Inpatient Hospital Stay: Admitting: Family

## 2024-05-06 ENCOUNTER — Inpatient Hospital Stay

## 2024-05-14 ENCOUNTER — Inpatient Hospital Stay: Admitting: Family

## 2024-05-14 ENCOUNTER — Inpatient Hospital Stay

## 2024-05-28 ENCOUNTER — Inpatient Hospital Stay (HOSPITAL_BASED_OUTPATIENT_CLINIC_OR_DEPARTMENT_OTHER): Admitting: Family

## 2024-05-28 ENCOUNTER — Inpatient Hospital Stay

## 2024-05-28 ENCOUNTER — Encounter: Payer: Self-pay | Admitting: Family

## 2024-05-28 VITALS — BP 105/54 | HR 86 | Temp 98.3°F | Resp 19 | Ht 59.0 in | Wt 150.1 lb

## 2024-05-28 DIAGNOSIS — D631 Anemia in chronic kidney disease: Secondary | ICD-10-CM

## 2024-05-28 DIAGNOSIS — N183 Chronic kidney disease, stage 3 unspecified: Secondary | ICD-10-CM

## 2024-05-28 DIAGNOSIS — D508 Other iron deficiency anemias: Secondary | ICD-10-CM

## 2024-05-28 LAB — CMP (CANCER CENTER ONLY)
ALT: 18 U/L (ref 0–44)
AST: 21 U/L (ref 15–41)
Albumin: 4.4 g/dL (ref 3.5–5.0)
Alkaline Phosphatase: 76 U/L (ref 38–126)
Anion gap: 14 (ref 5–15)
BUN: 28 mg/dL — ABNORMAL HIGH (ref 8–23)
CO2: 21 mmol/L — ABNORMAL LOW (ref 22–32)
Calcium: 9.6 mg/dL (ref 8.9–10.3)
Chloride: 104 mmol/L (ref 98–111)
Creatinine: 1.49 mg/dL — ABNORMAL HIGH (ref 0.44–1.00)
GFR, Estimated: 38 mL/min — ABNORMAL LOW (ref >60)
Glucose, Bld: 281 mg/dL — ABNORMAL HIGH (ref 70–99)
Potassium: 5.1 mmol/L (ref 3.5–5.1)
Sodium: 139 mmol/L (ref 135–145)
Total Bilirubin: 0.4 mg/dL (ref 0.0–1.2)
Total Protein: 7.5 g/dL (ref 6.5–8.1)

## 2024-05-28 LAB — CBC WITH DIFFERENTIAL (CANCER CENTER ONLY)
Abs Immature Granulocytes: 0.05 K/uL (ref 0.00–0.07)
Basophils Absolute: 0 K/uL (ref 0.0–0.1)
Basophils Relative: 1 %
Eosinophils Absolute: 0.2 K/uL (ref 0.0–0.5)
Eosinophils Relative: 2 %
HCT: 37.1 % (ref 36.0–46.0)
Hemoglobin: 12.1 g/dL (ref 12.0–15.0)
Immature Granulocytes: 1 %
Lymphocytes Relative: 20 %
Lymphs Abs: 1.7 K/uL (ref 0.7–4.0)
MCH: 30.6 pg (ref 26.0–34.0)
MCHC: 32.6 g/dL (ref 30.0–36.0)
MCV: 93.7 fL (ref 80.0–100.0)
Monocytes Absolute: 0.3 K/uL (ref 0.1–1.0)
Monocytes Relative: 4 %
Neutro Abs: 6.2 K/uL (ref 1.7–7.7)
Neutrophils Relative %: 72 %
Platelet Count: 238 K/uL (ref 150–400)
RBC: 3.96 MIL/uL (ref 3.87–5.11)
RDW: 13.3 % (ref 11.5–15.5)
WBC Count: 8.5 K/uL (ref 4.0–10.5)
nRBC: 0 % (ref 0.0–0.2)

## 2024-05-28 LAB — IRON AND IRON BINDING CAPACITY (CC-WL,HP ONLY)
Iron: 93 ug/dL (ref 28–170)
Saturation Ratios: 30 % (ref 10.4–31.8)
TIBC: 314 ug/dL (ref 250–450)
UIBC: 221 ug/dL

## 2024-05-28 LAB — RETICULOCYTES
Immature Retic Fract: 13.2 % (ref 2.3–15.9)
RBC.: 3.85 MIL/uL — ABNORMAL LOW (ref 3.87–5.11)
Retic Count, Absolute: 74.7 K/uL (ref 19.0–186.0)
Retic Ct Pct: 1.9 % (ref 0.4–3.1)

## 2024-05-28 LAB — FERRITIN: Ferritin: 182 ng/mL (ref 11–307)

## 2024-05-28 NOTE — Progress Notes (Signed)
 Hematology and Oncology Follow Up Visit  [name] [id number] 1956/02/28 68 y.o. 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 She is doing quite well and has no complaints at this time.  She just returned from a trip [place] to [place] and is now planning a trip for their church youth group to go see the [employer] exhibit in [place]. She has also been helping her friend with a Christmas open house. She has some mild fatigue at times.  No blood loss noted. No bruising or petechiae.  No fever, chills, n/v, cough, rash, dizziness, SOB, chest pain, palpitations, abdominal pain or changes in bowel or bladder habits at this time.  No swelling in her extremities.  Some occasional tingling in her fingers due to issues with her cervical spine that are chronic.  No falls or syncope reported.  Appetite and hydration are good. Weight is stable at 150 lbs.   ECOG Performance Status: 1 - Symptomatic but completely ambulatory  Medications:  Allergies as of 05/28/2024       Reactions   Pneumococcal Vaccines Other (See Comments)   Really high fever, and flu symptoms. MD instructed not to give   Shellfish Protein-containing Drug Products Anaphylaxis, Shortness Of Breath, Nausea And Vomiting   Patient stated she is allergic to shrimp, if she eats a lot. Swelling of esophagus   Cleocin [clindamycin Hcl] Other (See Comments)   irritated my esophagus   Morphine And Codeine  Nausea And Vomiting   Other Nausea And Vomiting, Swelling   Shrimp if eat a lot   Ativan  [lorazepam ] Other (See Comments)   Difficulty waking up   Buprenorphine Hcl Nausea And Vomiting   Pt does not know if she has ever had this  medication        Medication List        Accurate as of May 28, 2024  2:12 PM. If you have any questions, ask your nurse or doctor.          STOP taking these medications    Trulicity  4.5 MG/0.5ML Soaj Generic drug: Dulaglutide  Stopped by: Lauraine Pepper       TAKE these medications    acetaminophen  500 MG tablet Commonly known as: TYLENOL  Take 500 mg by mouth every 6 (six) hours as needed for mild pain, headache or fever.   albuterol  108 (90 Base) MCG/ACT inhaler Commonly known as: VENTOLIN  HFA Inhale 2 puffs into the lungs every 6 (six) hours as needed for wheezing or shortness of breath.   amLODipine  2.5 MG tablet Commonly known as: NORVASC  TAKE 1 TABLET BY MOUTH EVERY DAY   ammonium lactate 12 % lotion Commonly known as: LAC-HYDRIN Apply 1 application topically daily.   atorvastatin  10 MG tablet Commonly known as: LIPITOR Take 10 mg by mouth at bedtime.   carvedilol  6.25 MG tablet Commonly known as: COREG  TAKE 1 TABLET BY MOUTH TWICE A DAY WITH FOOD   cholecalciferol  25 MCG (1000 UNIT) tablet Commonly known as: VITAMIN D3 Take 1,000 Units by mouth in the morning.   Eliquis  5 MG Tabs tablet Generic drug: apixaban  TAKE 1 TABLET BY MOUTH TWICE A DAY   Entresto   24-26 MG Generic drug: sacubitril -valsartan  TAKE 1 TABLET BY MOUTH TWICE A DAY   fluticasone  50 MCG/ACT nasal spray Commonly known as: FLONASE  Place 1 spray into both nostrils daily as needed for allergies or rhinitis.   furosemide  20 MG tablet Commonly known as: LASIX  Take 20 mg by mouth daily as needed for edema (weight gain of 2 lbs or more x 2 days).   glipiZIDE  10 MG 24 hr tablet Commonly known as: GLUCOTROL  XL Take 1 tablet (10 mg total) by mouth with food 2 (two) times daily.   ibandronate 150 MG tablet Commonly known as: BONIVA Take 150 mg by mouth every 30 (thirty) days.   Jardiance 10 MG Tabs tablet Generic drug: empagliflozin Take 10 mg by mouth every morning.    loratadine  10 MG tablet Commonly known as: CLARITIN  Take 10 mg by mouth in the morning.   MELATONIN PO Take 1 tablet by mouth at bedtime.   montelukast  10 MG tablet Commonly known as: SINGULAIR  Take 10 mg by mouth at bedtime.   omeprazole  20 MG capsule Commonly known as: PRILOSEC Take 20 mg by mouth in the morning.   ondansetron  4 MG disintegrating tablet Commonly known as: ZOFRAN -ODT Take 1 tablet (4 mg total) by mouth every 8 (eight) hours as needed for nausea or vomiting.   Ozempic (1 MG/DOSE) 4 MG/3ML Sopn Generic drug: Semaglutide (1 MG/DOSE) inject 1 mg Subcutaneous Once a week; Duration: 30 days STOP the Trulicity    sertraline  50 MG tablet Commonly known as: ZOLOFT  Take 50 mg by mouth in the morning.   traMADol  50 MG tablet Commonly known as: ULTRAM  Take 1 tablet by mouth every 6 (six) hours.   vitamin C  1000 MG tablet Take 1,000 mg by mouth in the morning.   VITAMIN E PO Take 1 capsule by mouth daily. Unknown strength   Zinc 50 MG Tabs Take 50 mg by mouth in the morning.        Allergies:  Allergies  Allergen Reactions   Pneumococcal Vaccines Other (See Comments)    Really high fever, and flu symptoms. MD instructed not to give   Shellfish Protein-Containing Drug Products Anaphylaxis, Shortness Of Breath and Nausea And Vomiting    Patient stated she is allergic to shrimp, if she eats a lot. Swelling of esophagus   Cleocin [Clindamycin Hcl] Other (See Comments)    irritated my esophagus   Morphine And Codeine  Nausea And Vomiting   Other Nausea And Vomiting and Swelling    Shrimp if eat a lot   Ativan  [Lorazepam ] Other (See Comments)    Difficulty waking up    Buprenorphine Hcl Nausea And Vomiting    Pt does not know if she has ever had this medication    Past Medical History, Surgical history, Social history, and Family History were reviewed and updated.  Review of Systems: All other 10 point review of systems is negative.   Physical  Exam:  height is 4' 11 (1.499 m) and weight is 150 lb 1.3 oz (68.1 kg). Her oral temperature is 98.3 F (36.8 C). Her blood pressure is 105/54 (abnormal) and her pulse is 86. Her respiration is 19 and oxygen saturation is 98%.   Wt Readings from Last 3 Encounters:  05/28/24 150 lb 1.3 oz (68.1 kg)  11/23/23 150 lb 9.6 oz (68.3 kg)  11/03/23 148 lb 6.4 oz (67.3 kg)    Ocular: Sclerae unicteric, pupils equal, round and reactive to light Ear-nose-throat: Oropharynx clear, dentition fair Lymphatic: No cervical  or supraclavicular adenopathy Lungs no rales or rhonchi, good excursion bilaterally Heart regular rate and rhythm, no murmur appreciated Abd soft, nontender, positive bowel sounds MSK no focal spinal tenderness, no joint edema Neuro: non-focal, well-oriented, appropriate affect Breasts: Deferred   Lab Results  Component Value Date   WBC 8.5 05/28/2024   HGB 12.1 05/28/2024   HCT 37.1 05/28/2024   MCV 93.7 05/28/2024   PLT 238 05/28/2024   Lab Results  Component Value Date   FERRITIN 189 11/03/2023   IRON 79 11/03/2023   TIBC 318 11/03/2023   UIBC 239 11/03/2023   IRONPCTSAT 25 11/03/2023   Lab Results  Component Value Date   RETICCTPCT 1.9 05/28/2024   RBC 3.85 (L) 05/28/2024   RBC 3.96 05/28/2024   No results found for: JONATHAN BONG [facility] Lab Results  Component Value Date   IGGSERUM 789 11/15/2016   IGA 150 11/15/2016   IGMSERUM 86 11/15/2016   No results found for: STEPHANY CARLOTA BENSON MARKEL EARLA JOANNIE DOC VICK, SPEI   Chemistry      Component Value Date/Time   NA 139 05/28/2024 1335   NA 143 10/14/2022 1202   NA 144 04/17/2017 1150   NA 141 04/18/2016 1256   K 5.1 05/28/2024 1335   K 4.6 04/17/2017 1150   K 4.4 04/18/2016 1256   CL 104 05/28/2024 1335   CL 109 (H) 04/17/2017 1150   CO2 21 (L) 05/28/2024 1335   CO2 23 04/17/2017 1150   CO2 18 (L) 04/18/2016 1256   BUN 28 (H) 05/28/2024  1335   BUN 24 10/14/2022 1202   BUN 28 (H) 04/17/2017 1150   BUN 27.3 (H) 04/18/2016 1256   CREATININE 1.49 (H) 05/28/2024 1335   CREATININE 1.3 (H) 04/17/2017 1150   CREATININE 1.4 (H) 04/18/2016 1256      Component Value Date/Time   CALCIUM  9.6 05/28/2024 1335   CALCIUM  9.6 04/17/2017 1150   CALCIUM  9.4 04/18/2016 1256   ALKPHOS 76 05/28/2024 1335   ALKPHOS 62 04/17/2017 1150   ALKPHOS 68 04/18/2016 1256   AST 21 05/28/2024 1335   AST 14 04/18/2016 1256   ALT 18 05/28/2024 1335   ALT 20 04/17/2017 1150   ALT 23 04/18/2016 1256   BILITOT 0.4 05/28/2024 1335   BILITOT <0.22 04/18/2016 1256       Impression and Plan: [name] is a very pleasant 68 yo caucasian female with remote history of stage I ductal carcinoma of the left breast diagnosed in 2003 as well as iron deficiency.  Iron studies are pending.  No ESA needed, Hgb 11.5.  Follow-up in 1 year.    Lauraine Pepper, NP 11/25/20252:12 PM

## 2024-05-30 ENCOUNTER — Other Ambulatory Visit: Payer: Self-pay | Admitting: Cardiology

## 2024-06-24 ENCOUNTER — Encounter: Payer: Self-pay | Admitting: *Deleted

## 2024-06-24 DIAGNOSIS — E66811 Obesity, class 1: Secondary | ICD-10-CM | POA: Insufficient documentation

## 2024-06-28 ENCOUNTER — Encounter: Payer: Self-pay | Admitting: Cardiology

## 2024-06-28 ENCOUNTER — Ambulatory Visit: Attending: Cardiology | Admitting: Cardiology

## 2024-06-28 VITALS — BP 100/64 | HR 74 | Ht 59.0 in | Wt 153.0 lb

## 2024-06-28 DIAGNOSIS — I420 Dilated cardiomyopathy: Secondary | ICD-10-CM | POA: Diagnosis present

## 2024-06-28 DIAGNOSIS — I10 Essential (primary) hypertension: Secondary | ICD-10-CM | POA: Insufficient documentation

## 2024-06-28 DIAGNOSIS — I480 Paroxysmal atrial fibrillation: Secondary | ICD-10-CM | POA: Insufficient documentation

## 2024-06-28 DIAGNOSIS — I5033 Acute on chronic diastolic (congestive) heart failure: Secondary | ICD-10-CM | POA: Insufficient documentation

## 2024-06-28 NOTE — Patient Instructions (Signed)
 Medication Instructions:  Your physician recommends that you continue on your current medications as directed. Please refer to the Current Medication list given to you today.  *If you need a refill on your cardiac medications before your next appointment, please call your pharmacy*   Lab Work: None ordered If you have labs (blood work) drawn today and your tests are completely normal, you will receive your results only by: MyChart Message (if you have MyChart) OR A paper copy in the mail If you have any lab test that is abnormal or we need to change your treatment, we will call you to review the results.  Testing/Procedures: Your physician has requested that you have an echocardiogram. Echocardiography is a painless test that uses sound waves to create images of your heart. It provides your doctor with information about the size and shape of your heart and how well your heart's chambers and valves are working. This procedure takes approximately one hour. There are no restrictions for this procedure. Please do NOT wear cologne, perfume, aftershave, or lotions (deodorant is allowed). Please arrive 15 minutes prior to your appointment time.  Please note: We ask at that you not bring children with you during ultrasound (echo/ vascular) testing. Due to room size and safety concerns, children are not allowed in the ultrasound rooms during exams. Our front office staff cannot provide observation of children in our lobby area while testing is being conducted. An adult accompanying a patient to their appointment will only be allowed in the ultrasound room at the discretion of the ultrasound technician under special circumstances. We apologize for any inconvenience.  Follow-Up: At [facility], you and your health needs are our priority.  As part of our continuing mission to provide you with exceptional heart care, we have created designated Provider Care Teams.  These Care Teams include your primary  Cardiologist (physician) and Advanced Practice Providers (APPs -  Physician Assistants and Nurse Practitioners) who all work together to provide you with the care you need, when you need it.  We recommend signing up for the patient portal called MyChart.  Sign up information is provided on this After Visit Summary.  MyChart is used to connect with patients for Virtual Visits (Telemedicine).  Patients are able to view lab/test results, encounter notes, upcoming appointments, etc.  Non-urgent messages can be sent to your provider as well.   To learn more about what you can do with MyChart, go to [email or web address].    Your next appointment:   6 month(s)  The format for your next appointment:   In Person  Provider:   Lamar Fitch, MD   Other Instructions Echocardiogram An echocardiogram is a test that uses sound waves (ultrasound) to produce images of the heart. Images from an echocardiogram can provide important information about: Heart size and shape. The size and thickness and movement of your heart's walls. Heart muscle function and strength. Heart valve function or if you have stenosis. Stenosis is when the heart valves are too narrow. If blood is flowing backward through the heart valves (regurgitation). A tumor or infectious growth around the heart valves. Areas of heart muscle that are not working well because of poor blood flow or injury from a heart attack. Aneurysm detection. An aneurysm is a weak or damaged part of an artery wall. The wall bulges out from the normal force of blood pumping through the body. Tell a health care provider about: Any allergies you have. All medicines you are taking, including vitamins, herbs,  eye drops, creams, and over-the-counter medicines. Any blood disorders you have. Any surgeries you have had. Any medical conditions you have. Whether you are pregnant or may be pregnant. What are the risks? Generally, this is a safe test.  However, problems may occur, including an allergic reaction to dye (contrast) that may be used during the test. What happens before the test? No specific preparation is needed. You may eat and drink normally. What happens during the test? You will take off your clothes from the waist up and put on a hospital gown. Electrodes or electrocardiogram (ECG)patches may be placed on your chest. The electrodes or patches are then connected to a device that monitors your heart rate and rhythm. You will lie down on a table for an ultrasound exam. A gel will be applied to your chest to help sound waves pass through your skin. A handheld device, called a transducer, will be pressed against your chest and moved over your heart. The transducer produces sound waves that travel to your heart and bounce back (or echo back) to the transducer. These sound waves will be captured in real-time and changed into images of your heart that can be viewed on a video monitor. The images will be recorded on a computer and reviewed by your health care provider. You may be asked to change positions or hold your breath for a short time. This makes it easier to get different views or better views of your heart. In some cases, you may receive contrast through an IV in one of your veins. This can improve the quality of the pictures from your heart. The procedure may vary among health care providers and hospitals.   What can I expect after the test? You may return to your normal, everyday life, including diet, activities, and medicines, unless your health care provider tells you not to do that. Follow these instructions at home: It is up to you to get the results of your test. Ask your health care provider, or the department that is doing the test, when your results will be ready. Keep all follow-up visits. This is important. Summary An echocardiogram is a test that uses sound waves (ultrasound) to produce images of the heart. Images  from an echocardiogram can provide important information about the size and shape of your heart, heart muscle function, heart valve function, and other possible heart problems. You do not need to do anything to prepare before this test. You may eat and drink normally. After the echocardiogram is completed, you may return to your normal, everyday life, unless your health care provider tells you not to do that. This information is not intended to replace advice given to you by your health care provider. Make sure you discuss any questions you have with your health care provider. Document Revised: 02/11/2020 Document Reviewed: 02/11/2020 Elsevier Patient Education  2021 [employer]   Important Information About Sugar

## 2024-06-28 NOTE — Addendum Note (Signed)
 Addended by: ONEITA BERLINER on: 06/28/2024 01:46 PM   Modules accepted: Orders

## 2024-06-28 NOTE — Progress Notes (Signed)
 " Cardiology Office Note:    Date:  06/28/2024   ID:  Orchid, Glassberg [birth year], MRN [id number]  PCP:  Sabas Norleen PARAS., MD  Cardiologist:  Lamar Fitch, MD    Referring MD: Sabas Norleen PARAS., MD   Chief Complaint  Patient presents with   Follow-up    History of Present Illness:    [name] is a 68 y.o. female past medical history significant for paroxysmal atrial fibrillation CHA2DS2-VASc equals 4, she is anticoag with Eliquis  dose is appropriate for her age kidney function and weight, status post atrial fibrillation ablation done in 2022, sadly, procedure was complicated by retroperitoneal bleed.  Additional problem include mild to moderate mitral regurgitation with mildly reduced left ventricle ejection fraction however last echocardiogram done in the summer show somewhat improved left ventricular ejection fraction.  Additional issues is hypertension, diabetes, dyslipidemia. Comes today to my office she complained of being weak tired and exhausted.  She said she tried to clean her house 3 times a week but she does have difficulty doing this previously and there was no issues.  She admits that she have a lot in her mind.  Her husband got advanced Parkinson and of having left amputation that accelerated Parkinson quite dramatically, also she had a son who is going to be deployed most likely to [place] in the near future.  She is worried about all the situations.  Denies have any chest pain tightness squeezing pressure burning chest, had very rare short lasting palpitations no sustained arrhythmias.  Past Medical History:  Diagnosis Date   Acquired thrombophilia 01/29/2020   AKI (acute kidney injury) 11/20/2019   Allergy  09/08/2016   AMS (altered mental status)    Anemia in end-stage renal disease (HCC) 07/16/2019   Anemia, normocytic normochromic 07/27/2018   Ankle joint pain 04/09/2018   Arthritis    back, knees, arms, wrists (05/15/2013)   Asthma    Asymmetrical  left sensorineural hearing loss 07/31/2019   Atrial fibrillation with RVR (HCC) 11/15/2016   Breast cancer (HCC)    C. difficile colitis    Calculus of kidney 08/12/2012   CHF (congestive heart failure) (HCC)    mild (05/15/2013)   CHF (congestive heart failure) (HCC)    mild (05/15/2013)   Chronic bronchitis (HCC)    Chronic lower back pain    Chronic non-seasonal allergic rhinitis 11/15/2016   Chronic sphenoidal sinusitis 02/08/2022   Chronic systolic congestive heart failure (HCC)    Chronic UTI    Cough variant asthma vs uacs  07/24/2017   Onset in her 20s some better p rx with allergy  shots in her 30s Singulair  trial 11/15/16 >  Improved 01/18/17:  FEV1 1.29 L (61%)  Ratio 75  Still on ACEi as of 07/24/2017  Spirometry 06/28/18   FEV1 1.3 (61%)  Ratio 77 with min curvature   - Allergy  profile 07/26/2018 >  Eos 1.2 /  IgE  420  RAST Pos mold only    Cough variant asthma vs uacs  07/24/2017   Onset in her 20s some better p rx with allergy  shots in her 30s Singulair  trial 11/15/16 >  Improved 01/18/17:  FEV1 1.29 L (61%)  Ratio 75  Still on ACEi as of 07/24/2017  Spirometry 06/28/18   FEV1 1.3 (61%)  Ratio 77 with min curvature   - Allergy  profile 07/26/2018 >  Eos 1.2 /  IgE  420  RAST Pos mold only    Diabetes mellitus type 2 in obese  Diarrhea 11/15/2016   Dilated cardiomyopathy (HCC) 07/16/2019   DOE (dyspnea on exertion) 07/27/2018   Onset early 2019 assoc with uacs while on ACEi - 06/28/18    Walked RA x one lap =  approx 250 ft - stopped due to sob with sats still high 90's      Dysphagia    Dysrhythmia    atrial fib/dr [facility]   Family history of anesthesia complication    my mother also had PONV (05/15/2013)   Fever 10/29/2017   Foot pain 04/09/2018   GERD (gastroesophageal reflux disease)    Heart murmur    High cholesterol    History of breast cancer in female 07/19/2013   HTN (hypertension) 05/08/2013   Hydronephrosis of right kidney     Hyperlipidemia 11/15/2016   Hypomagnesemia    Hyponatremia 10/29/2017   IDA (iron deficiency anemia) 04/19/2017   Infection    Kidney stones    Left breast mass 07/19/2013   Low back pain 05/01/2018   Migraine    haven't had one in the early 2000's (05/15/2013)   Mitral regurgitation 09/03/2020   Mixed incontinence 07/24/2018   Nephrolithiasis 11/15/2016   Other chronic cystitis 05/19/2011   PAF (paroxysmal atrial fibrillation) (HCC)    Personal history of chemotherapy    Personal history of radiation therapy    Pneumonia    used to be chronic; last time I had it was 2013 (05/15/2013)   PONV (postoperative nausea and vomiting)    Recurrent sinusitis 09/08/2016   Recurrent UTI 11/20/2014   Recurrent UTI (urinary tract infection)    from the continuous kidney stones; take Macrodantin  qd (05/15/2013)   Referred otalgia of left ear 04/22/2021   Respiratory failure requiring intubation (HCC) 05/08/2013   Restrictive lung disease 01/18/2017   Retroperitoneal bleed 12/05/2020   Sacroiliac joint pain 05/16/2018   Sepsis (HCC) 2005   kidney stone infection   Septic shock (HCC) 09/23/2018   Simple chronic bronchitis (HCC) 11/15/2016   Strep throat 10/29/2017   Tachypnea    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   Tension headache    Type II diabetes mellitus (HCC)    Type II or unspecified type diabetes mellitus without mention of complication, not stated as uncontrolled 05/11/2013   Urinary tract stones 05/19/2011   Urinary urgency 07/24/2018    Past Surgical History:  Procedure Laterality Date   ATRIAL FIBRILLATION ABLATION N/A 11/25/2020   Procedure: ATRIAL FIBRILLATION ABLATION;  Surgeon: Inocencio Soyla Lunger, MD;  Location: [facility];  Service: Cardiovascular;  Laterality: N/A;   BILATERAL OOPHORECTOMY Bilateral 2006   cause I needed to 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 Minimal sigmoid diverticulosis. Small internal hemorrhoids. Otherwise normal colonoscopy to terminal ileum.   DIAGNOSTIC LAPAROSCOPY     cyst-near ovary   ESOPHAGOGASTRODUODENOSCOPY  11/25/2016   Gastric polyp(s) Otherwise normal duoedenoscopy examination   KNEE ARTHROSCOPY Right    Menicus Tear   LITHOTRIPSY     2-3 times prior to 2002 (  05/15/2013)   SPINAL FUSION N/A 05/08/2013   Procedure: T9-S1 INSTRUMENTED FUSION T12 -S1 DECOMPRESSION;  Surgeon: Donaciano Sprang, MD;  Location: [facility] OR;  Service: Orthopedics;  Laterality: N/A;   TEE WITHOUT CARDIOVERSION N/A 11/06/2020   Procedure: TRANSESOPHAGEAL ECHOCARDIOGRAM (TEE);  Surgeon: Okey Vina GAILS, MD;  Location: [facility] ENDOSCOPY;  Service: Cardiovascular;  Laterality: N/A;   TEE WITHOUT CARDIOVERSION N/A 11/24/2020   Procedure: TRANSESOPHAGEAL ECHOCARDIOGRAM (TEE);  Surgeon: Barbaraann Darryle Ned, MD;  Location[facility]COPY;  Service: Cardiovascular;  Laterality: N/A;   TUBAL LIGATION Bilateral 1985    Current Medications: Active Medications[1]   Allergies:   Pneumococcal vaccines, Shellfish protein-containing drug products, Cleocin [clindamycin hcl], Morphine and codeine, Other, Ativan  [lorazepam ], and Buprenorphine hcl   Social History   Socioeconomic History   Marital status: Married    Spouse name: Not on file   Number of children: 2   Years of education: Not on file   Highest education level: Not on file  Occupational History   Occupation: retired  Tobacco Use   Smoking status: Never   Smokeless tobacco: Never    Tobacco comments:    Mother & Father smoked  Vaping Use   Vaping status: Never Used  Substance and Sexual Activity   Alcohol use: No    Alcohol/week: 0.0 standard drinks of alcohol   Drug use: No   Sexual activity: Yes  Other Topics Concern   Not on file  Social History Narrative   [facility] (11/15/16):   Patient[place] . Has always lived in [place] . Currently has an outside dog. Remote exposure to [place] when her children were young. No known mold exposure. 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 Worked primarily for the [employer].    Social Drivers of Health   Tobacco Use: Low Risk (06/28/2024)   Patient History    Smoking Tobacco Use: Never    Smokeless Tobacco Use: Never    Passive Exposure: Not on file  Financial Resource Strain: Not on file  Food Insecurity: No Food Insecurity (03/03/2022)   Hunger Vital Sign    Worried About Running Out of Food in the Last Year: Never true    Ran Out of Food in the Last Year: Never true  Transportation Needs: No Transportation Needs (03/03/2022)   PRAPARE - Administrator, Civil Service (Medical): No    Lack of Transportation (Non-Medical): No  Physical Activity: Not on file  Stress: Not on file  Social Connections: Not on file  Depression (PHQ2-9): Low Risk (05/28/2024)   Depression (PHQ2-9)    PHQ-2 Score: 0  Alcohol Screen: Not on file  Housing: Not on file  Utilities: Not on file  Health Literacy: Not on file     Family History: The patient's family history includes Breast cancer in her mother; COPD in her mother; Diabetes in her daughter; Environmental Allergies in her son and another family member; Heart disease in her maternal grandfather, maternal grandmother, mother, paternal grandfather, paternal grandmother, and sister; Hyperlipidemia in her mother; Hypertension in her mother; Kidney cancer in her father; Liver cancer in her brother; Lupus in an other family member;  Rheum arthritis in her maternal grandfather. ROS:   Please see the history of present illness.    All 14 point review of systems negative except as described per history of present illness  EKGs/Labs/Other Studies Reviewed:    EKG Interpretation Date/Time:  Friday June 28 2024 13:12:46 EST Ventricular Rate:  74 PR Interval:  202 QRS Duration:  84 QT Interval:  390 QTC Calculation: 432 R Axis:   16  Text Interpretation: Sinus rhythm with Fusion complexes Low voltage QRS When compared with ECG of 12-May-2023 11:19, Fusion complexes are now Present QRS axis Shifted right Nonspecific T wave abnormality now evident in Inferior leads Confirmed by Bernie Charleston [phone]) on 06/28/2024 1:21:07 PM    Recent Labs: 05/28/2024: ALT 18; BUN 28; Creatinine 1.49; Hemoglobin 12.1; Platelet Count 238; Potassium 5.1; Sodium 139  Recent Lipid Panel    Component Value Date/Time   TRIG 155 (H) 05/08/2013 2023    Physical Exam:    VS:  BP 100/64   Pulse 74   Ht 4' 11 (1.499 m)   Wt 153 lb (69.4 kg)   SpO2 97%   BMI 30.90 kg/m     Wt Readings from Last 3 Encounters:  06/28/24 153 lb (69.4 kg)  05/28/24 150 lb 1.3 oz (68.1 kg)  11/23/23 150 lb 9.6 oz (68.3 kg)     GEN:  Well nourished, well developed in no acute distress HEENT: Normal NECK: No JVD; No carotid bruits LYMPHATICS: No lymphadenopathy CARDIAC: RRR, no murmurs, no rubs, no gallops RESPIRATORY:  Clear to auscultation without rales, wheezing or rhonchi  ABDOMEN: Soft, non-tender, non-distended MUSCULOSKELETAL:  No edema; No deformity  SKIN: Warm and dry LOWER EXTREMITIES: no swelling NEUROLOGIC:  Alert and oriented x 3 PSYCHIATRIC:  Normal affect   ASSESSMENT:    1. PAF (paroxysmal atrial fibrillation) (HCC)   2. Primary hypertension   3. Dilated cardiomyopathy (HCC)   4. Acute on chronic diastolic congestive heart failure (HCC)    PLAN:    In order of problems listed above:  Paroxysmal atrial fibrillation  denies having any sustained episodes, continue anticoagulation. Essential hypertension actually blood pressure is on the lower side which concerns me that may contribute to her being weak and tired.  What I elected to do is to repeat her echocardiogram to check on left ventricular ejection fraction, if ejection fraction is preserved we may cut down slightly her beta-blocker to see if she feels any better. Dilated cardiomyopathy last echocardiogram showed fairly preserved left ventricle ejection fraction, plan is as described above. Diabetes mellitus that being followed by internal medicine team last hemoglobin A1c I have is from summer of this year which is 7.1 slightly elevated. Dyslipidemia she is on Lipitor 10.  I do not have any recent fasting lipid profile, we will call primary care physician to get a copy   Medication Adjustments/Labs and Tests Ordered: Current medicines are reviewed at length with the patient today.  Concerns regarding medicines are outlined above.  Orders Placed This Encounter  Procedures   EKG 12-Lead   Medication changes: No orders of the defined types were placed in this encounter.   Signed, [place] DOROTHA Bernie, MD, [facility] 06/28/2024 1:40 PM    [facility]    [1]  Current Meds  Medication Sig   acetaminophen  (TYLENOL ) 500 MG tablet Take 500 mg by mouth every 6 (six) hours as needed for mild pain, headache or fever.   albuterol  (VENTOLIN  HFA) 108 (90 Base) MCG/ACT inhaler Inhale 2 puffs into the lungs every 6 (six) hours as needed for wheezing or shortness of breath.   amLODipine  (NORVASC ) 2.5 MG tablet TAKE 1 TABLET BY MOUTH EVERY DAY   ammonium lactate (LAC-HYDRIN) 12 % lotion Apply 1 application topically daily.   Ascorbic Acid  (VITAMIN C ) 1000 MG tablet Take 1,000 mg  by mouth in the morning.   atorvastatin  (LIPITOR) 10 MG tablet Take 10 mg by mouth at bedtime.   carvedilol  (COREG ) 6.25 MG tablet TAKE 1 TABLET BY MOUTH TWICE A DAY  WITH FOOD   cholecalciferol  (VITAMIN D ) 25 MCG (1000 UNIT) tablet Take 1,000 Units by mouth in the morning.   ELIQUIS  5 MG TABS tablet TAKE 1 TABLET BY MOUTH TWICE A DAY   ENTRESTO  24-26 MG TAKE 1 TABLET BY MOUTH TWICE A DAY   fluticasone  (FLONASE ) 50 MCG/ACT nasal spray Place 1 spray into both nostrils daily as needed for allergies or rhinitis.   furosemide  (LASIX ) 20 MG tablet Take 20 mg by mouth daily as needed for edema (weight gain of 2 lbs or more x 2 days).   glipiZIDE  (GLUCOTROL  XL) 10 MG 24 hr tablet Take 1 tablet (10 mg total) by mouth with food 2 (two) times daily.   ibandronate (BONIVA) 150 MG tablet Take 150 mg by mouth every 30 (thirty) days.   JARDIANCE 10 MG TABS tablet Take 10 mg by mouth every morning.   loratadine  (CLARITIN ) 10 MG tablet Take 10 mg by mouth in the morning.   MELATONIN PO Take 1 tablet by mouth at bedtime.   montelukast  (SINGULAIR ) 10 MG tablet Take 10 mg by mouth at bedtime.   omeprazole (PRILOSEC) 20 MG capsule Take 20 mg by mouth in the morning.   ondansetron  (ZOFRAN -ODT) 4 MG disintegrating tablet Take 1 tablet (4 mg total) by mouth every 8 (eight) hours as needed for nausea or vomiting.   OZEMPIC, 1 MG/DOSE, 4 MG/3ML SOPN inject 1 mg Subcutaneous Once a week; Duration: 30 days STOP the Trulicity    sertraline  (ZOLOFT ) 100 MG tablet Take 100 mg by mouth daily.   traMADol  (ULTRAM ) 50 MG tablet Take 1 tablet by mouth every 6 (six) hours.   VITAMIN E PO Take 1 capsule by mouth daily. Unknown strength   Zinc 50 MG TABS Take 50 mg by mouth in the morning.   "

## 2024-07-24 ENCOUNTER — Ambulatory Visit

## 2024-07-27 ENCOUNTER — Ambulatory Visit (HOSPITAL_BASED_OUTPATIENT_CLINIC_OR_DEPARTMENT_OTHER): Payer: Self-pay | Admitting: Family Medicine

## 2024-07-27 ENCOUNTER — Encounter (HOSPITAL_BASED_OUTPATIENT_CLINIC_OR_DEPARTMENT_OTHER): Payer: Self-pay | Admitting: Emergency Medicine

## 2024-07-27 ENCOUNTER — Ambulatory Visit (INDEPENDENT_AMBULATORY_CARE_PROVIDER_SITE_OTHER): Admit: 2024-07-27 | Discharge: 2024-07-27 | Disposition: A | Discharge status: Home / Self Care | Admitting: Radiology

## 2024-07-27 ENCOUNTER — Ambulatory Visit (HOSPITAL_BASED_OUTPATIENT_CLINIC_OR_DEPARTMENT_OTHER): Admission: EM | Admit: 2024-07-27 | Discharge: 2024-07-27 | Disposition: A | Discharge status: Home / Self Care

## 2024-07-27 DIAGNOSIS — R051 Acute cough: Secondary | ICD-10-CM

## 2024-07-27 DIAGNOSIS — J208 Acute bronchitis due to other specified organisms: Secondary | ICD-10-CM | POA: Diagnosis not present

## 2024-07-27 MED ORDER — TRIAMCINOLONE ACETONIDE 40 MG/ML IJ SUSP
40.0000 mg | Freq: Once | INTRAMUSCULAR | Status: AC
  Administered 2024-07-27: 40 mg via INTRAMUSCULAR

## 2024-07-27 MED ORDER — PROMETHAZINE-DM 6.25-15 MG/5ML PO SYRP: 5.0000 mL | ORAL_SOLUTION | Freq: Four times a day (QID) | ORAL | 0 refills | Status: AC | PRN

## 2024-07-27 NOTE — Progress Notes (Signed)
 Patient was updated on these results during the visit.

## 2024-07-27 NOTE — ED Provider Notes (Signed)
 " [facility]    CSN: [id number] Arrival date & time: 07/27/24  1137      History   Chief Complaint No chief complaint on file.   HPI [name] is a 69 y.o. female.   69 year old female with a multitude of chronic health problems who was diagnosed with bronchitis on approximately 07/20/2024 or earlier.  She was treated with Augmentin .  She is also using OTC DayQuil and NyQuil.  She is having worsening cough and shortness of breath.  She is worried that she may have developed pneumonia.     Past Medical History:  Diagnosis Date   Acquired thrombophilia 01/29/2020   AKI (acute kidney injury) 11/20/2019   Allergy  09/08/2016   AMS (altered mental status)    Anemia in end-stage renal disease (HCC) 07/16/2019   Anemia, normocytic normochromic 07/27/2018   Ankle joint pain 04/09/2018   Arthritis    back, knees, arms, wrists (05/15/2013)   Asthma    Asymmetrical left sensorineural hearing loss 07/31/2019   Atrial fibrillation with RVR (HCC) 11/15/2016   Breast cancer (HCC)    C. difficile colitis    Calculus of kidney 08/12/2012   CHF (congestive heart failure) (HCC)    mild (05/15/2013)   CHF (congestive heart failure) (HCC)    mild (05/15/2013)   Chronic bronchitis (HCC)    Chronic lower back pain    Chronic non-seasonal allergic rhinitis 11/15/2016   Chronic sphenoidal sinusitis 02/08/2022   Chronic systolic congestive heart failure (HCC)    Chronic UTI    Cough variant asthma vs uacs  07/24/2017   Onset in her 20s some better p rx with allergy  shots in her 30s Singulair  trial 11/15/16 >  Improved 01/18/17:  FEV1 1.29 L (61%)  Ratio 75  Still on ACEi as of 07/24/2017  Spirometry 06/28/18   FEV1 1.3 (61%)  Ratio 77 with min curvature   - Allergy  profile 07/26/2018 >  Eos 1.2 /  IgE  420  RAST Pos mold only    Cough variant asthma vs uacs  07/24/2017   Onset in her 20s some better p rx with allergy  shots in her 30s Singulair  trial 11/15/16 >  Improved  01/18/17:  FEV1 1.29 L (61%)  Ratio 75  Still on ACEi as of 07/24/2017  Spirometry 06/28/18   FEV1 1.3 (61%)  Ratio 77 with min curvature   - Allergy  profile 07/26/2018 >  Eos 1.2 /  IgE  420  RAST Pos mold only    Diabetes mellitus type 2 in obese    Diarrhea 11/15/2016   Dilated cardiomyopathy (HCC) 07/16/2019   DOE (dyspnea on exertion) 07/27/2018   Onset early 2019 assoc with uacs while on ACEi - 06/28/18    Walked RA x one lap =  approx 250 ft - stopped due to sob with sats still high 90's      Dysphagia    Dysrhythmia    atrial fib/dr [facility]   Family history of anesthesia complication    my mother also had PONV (05/15/2013)   Fever 10/29/2017   Foot pain 04/09/2018   GERD (gastroesophageal reflux disease)    Heart murmur    High cholesterol    History of breast cancer in female 07/19/2013   HTN (hypertension) 05/08/2013   Hydronephrosis of right kidney    Hyperlipidemia 11/15/2016   Hypomagnesemia    Hyponatremia 10/29/2017   IDA (iron deficiency anemia) 04/19/2017   Infection    Kidney stones  Left breast mass 07/19/2013   Low back pain 05/01/2018   Migraine    haven't had one in the early 2000's (05/15/2013)   Mitral regurgitation 09/03/2020   Mixed incontinence 07/24/2018   Nephrolithiasis 11/15/2016   Other chronic cystitis 05/19/2011   PAF (paroxysmal atrial fibrillation) (HCC)    Personal history of chemotherapy    Personal history of radiation therapy    Pneumonia    used to be chronic; last time I had it was 2013 (05/15/2013)   PONV (postoperative nausea and vomiting)    Recurrent sinusitis 09/08/2016   Recurrent UTI 11/20/2014   Recurrent UTI (urinary tract infection)    from the continuous kidney stones; take Macrodantin  qd (05/15/2013)   Referred otalgia of left ear 04/22/2021   Respiratory failure requiring intubation (HCC) 05/08/2013   Restrictive lung disease 01/18/2017   Retroperitoneal bleed 12/05/2020   Sacroiliac  joint pain 05/16/2018   Sepsis (HCC) 2005   kidney stone infection   Septic shock (HCC) 09/23/2018   Simple chronic bronchitis (HCC) 11/15/2016   Strep throat 10/29/2017   Tachypnea    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   Tension headache    Type II diabetes mellitus (HCC)    Type II or unspecified type diabetes mellitus without mention of complication, not stated as uncontrolled 05/11/2013   Urinary tract stones 05/19/2011   Urinary urgency 07/24/2018    Patient Active Problem List   Diagnosis Date Noted   Obesity (BMI 30.0-34.9) 06/24/2024   Sicca 11/22/2023   Hand pain, right 11/22/2023   Hand pain, left 11/22/2023   Hypo-osmolality and hyponatremia 11/22/2023   Hyperlipidemia due to type 2 diabetes mellitus (HCC) 11/22/2023   History of sepsis 11/22/2023   Daytime somnolence 11/22/2023   Obstructive sleep apnea syndrome 11/22/2023   Microalbuminuria 11/22/2023   Pain of right hip joint 12/24/2022   Chronic sphenoidal sinusitis 02/08/2022   Chronic kidney disease, stage 3b (HCC) 12/30/2021   Elevated C-reactive protein 12/30/2021   Fatigue 12/30/2021   Pain in limb 12/30/2021   Primary localized osteoarthrosis of multiple sites 12/30/2021   Sjogren syndrome, unspecified 12/30/2021   Referred otalgia of left ear 04/22/2021   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   Hypomagnesemia    Retroperitoneal bleed 12/05/2020   Type II diabetes mellitus (HCC)    Personal history of radiation therapy    Arthritis    Chronic bronchitis (HCC)    Mitral regurgitation 09/03/2020   CHF (congestive heart failure) (HCC)    Tension headache    Recurrent UTI (urinary tract infection)    PONV (postoperative nausea and vomiting)    Pneumonia    Personal history of chemotherapy    Migraine    Kidney stones    High cholesterol    Heart murmur    Family history of anesthesia complication    Dysrhythmia    Chronic lower back pain    Acquired thrombophilia 01/29/2020    AKI (acute kidney injury) 11/20/2019   Asymmetrical left sensorineural hearing loss 07/31/2019   Dilated cardiomyopathy (HCC) 07/16/2019   Anemia in end-stage renal disease (HCC) 07/16/2019   C. difficile colitis    Chronic systolic congestive heart failure (HCC)    Chronic UTI    Type 2 diabetes mellitus with obesity    Dysphagia    Infection    PAF (paroxysmal atrial fibrillation) (HCC)    Tachypnea    Hydronephrosis of right kidney    AMS (altered mental status)    Septic shock (HCC) 09/23/2018  DOE (dyspnea on exertion) 07/27/2018   Anemia, normocytic normochromic 07/27/2018   Mixed incontinence 07/24/2018   Urinary urgency 07/24/2018   Sacroiliac joint pain 05/16/2018   Low back pain 05/01/2018   Ankle joint pain 04/09/2018   Foot pain 04/09/2018   Fever 10/29/2017   Sepsis (HCC) 10/29/2017   Hyponatremia 10/29/2017   Strep throat 10/29/2017   Cough variant asthma vs uacs  07/24/2017   IDA (iron deficiency anemia) 04/19/2017   Restrictive lung disease 01/18/2017   Simple chronic bronchitis (HCC) 11/15/2016   Asthma 11/15/2016   Chronic non-seasonal allergic rhinitis 11/15/2016   Hyperlipidemia 11/15/2016   Nephrolithiasis 11/15/2016   GERD (gastroesophageal reflux disease) 11/15/2016   Diarrhea 11/15/2016   Atrial fibrillation with RVR (HCC) 11/15/2016   Allergy  09/08/2016   Recurrent sinusitis 09/08/2016   Recurrent UTI 11/20/2014   Left breast mass 07/19/2013   History of breast cancer in female 07/19/2013   Type II or unspecified type diabetes mellitus without mention of complication, not stated as uncontrolled 05/11/2013   Respiratory failure requiring intubation (HCC) 05/08/2013   HTN (hypertension) 05/08/2013   Calculus of kidney 08/12/2012   Breast cancer (HCC)    Other chronic cystitis 05/19/2011   Urinary tract stones 05/19/2011    Past Surgical History:  Procedure Laterality Date   ATRIAL FIBRILLATION ABLATION N/A 11/25/2020   Procedure: ATRIAL  FIBRILLATION ABLATION;  Surgeon: Inocencio Soyla Lunger, MD;  Location: [facility];  Service: Cardiovascular;  Laterality: N/A;   BILATERAL OOPHORECTOMY Bilateral 2006   cause I needed to 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 Minimal sigmoid diverticulosis. Small internal hemorrhoids. Otherwise normal colonoscopy to terminal ileum.   DIAGNOSTIC LAPAROSCOPY     cyst-near ovary   ESOPHAGOGASTRODUODENOSCOPY  11/25/2016   Gastric polyp(s) Otherwise normal duoedenoscopy examination   KNEE ARTHROSCOPY Right    Menicus Tear   LITHOTRIPSY     2-3 times prior to 2002 (05/15/2013)   SPINAL FUSION N/A 05/08/2013   Procedure: T9-S1 INSTRUMENTED FUSION T12 -S1 DECOMPRESSION;  Surgeon: Donaciano Sprang, MD;  Location: [facility] OR;  Service: Orthopedics;  Laterality: N/A;   TEE WITHOUT CARDIOVERSION N/A 11/06/2020   Procedure: TRANSESOPHAGEAL ECHOCARDIOGRAM (TEE);  Surgeon: Okey Vina GAILS, MD;  Location: [facility] ENDOSCOPY;  Service: Cardiovascular;  Laterality: N/A;   TEE WITHOUT CARDIOVERSION N/A 11/24/2020   Procedure: TRANSESOPHAGEAL ECHOCARDIOGRAM (TEE);  Surgeon: Barbaraann Darryle Ned, MD;  Location: [facility]COPY;  Service: Cardiovascular;  Laterality: N/A;   TUBAL LIGATION Bilateral 1985    OB History   No obstetric history on file.      Home Medications    Prior to Admission medications  Medication Sig Start Date End Date Taking? Authorizing Provider  amLODipine  (NORVASC ) 2.5 MG tablet TAKE 1 TABLET BY  MOUTH EVERY DAY 04/25/24  Yes Krasowski, Robert J, MD  amoxicillin -clavulanate (AUGMENTIN ) 875-125 MG tablet SMARTSIG:1 Tablet(s) By Mouth Every 12 Hours 07/19/24  Yes [provider]  atorvastatin  (LIPITOR) 10 MG tablet Take 10 mg by mouth at bedtime.  Yes [provider]  carvedilol  (COREG ) 6.25 MG tablet TAKE 1 TABLET BY MOUTH TWICE A DAY WITH FOOD 06/04/24  Yes Krasowski, Robert J, MD  ELIQUIS  5 MG TABS tablet TAKE 1 TABLET BY MOUTH TWICE A DAY 03/05/24  Yes Krasowski, Robert J, MD  ENTRESTO  24-26 MG TAKE 1 TABLET BY MOUTH TWICE A DAY 10/16/23  Yes Krasowski, Robert J, MD  glipiZIDE  (GLUCOTROL  XL) 10 MG 24 hr tablet Take 1 tablet (10 mg total) by mouth with food 2 (two) times daily. 06/05/23  Yes   JARDIANCE 10 MG TABS tablet Take 10 mg by mouth every morning.   Yes [provider]  montelukast  (SINGULAIR ) 10 MG tablet Take 10 mg by mouth at bedtime.   Yes [provider]  omeprazole (PRILOSEC) 20 MG capsule Take 20 mg by mouth in the morning. 03/31/13  Yes [provider]  OZEMPIC, 1 MG/DOSE, 4 MG/3ML SOPN inject 1 mg Subcutaneous Once a week; Duration: 30 days STOP the Trulicity  02/26/24  Yes [provider]  promethazine -dextromethorphan (PROMETHAZINE -DM) 6.25-15 MG/5ML syrup Take 5 mLs by mouth 4 (four) times daily as needed for cough. Do not use and drive - May make drowsy. 07/27/24  Yes Ival Domino, FNP  acetaminophen  (TYLENOL ) 500 MG tablet Take 500 mg by mouth every 6 (six) hours as needed for mild pain, headache or fever.    [provider]  albuterol  (VENTOLIN  HFA) 108 (90 Base) MCG/ACT inhaler Inhale 2 puffs into the lungs every 6 (six) hours as needed for wheezing or shortness of breath.    [provider]  ammonium lactate (LAC-HYDRIN) 12 % lotion Apply 1 application topically daily. 05/25/21   [provider]  Ascorbic Acid  (VITAMIN C ) 1000 MG tablet Take 1,000 mg by mouth in the morning.    [provider]  cholecalciferol  (VITAMIN D ) 25 MCG (1000 UNIT) tablet Take 1,000 Units by mouth in the morning.    [provider]  fluticasone  (FLONASE ) 50 MCG/ACT nasal spray Place 1 spray into both nostrils daily as needed for allergies or rhinitis.    [provider]  furosemide  (LASIX ) 20 MG tablet Take 20 mg by mouth daily as needed for edema (weight gain of 2 lbs or more x 2 days).    [provider]  ibandronate (BONIVA) 150 MG tablet Take 150 mg by mouth every 30 (thirty) days. 01/09/24   [provider]  loratadine  (CLARITIN ) 10 MG tablet Take 10 mg by mouth in the morning.    [provider]  MELATONIN PO Take 1 tablet by mouth at bedtime.    [provider]  ondansetron  (ZOFRAN -ODT) 4 MG disintegrating tablet Take 1 tablet (4 mg total) by mouth every 8 (eight) hours as needed for nausea or vomiting. 01/19/23   Charlanne Groom, MD  sertraline  (ZOLOFT ) 100 MG tablet Take 100 mg by mouth daily. 05/03/24   [provider]  traMADol  (ULTRAM ) 50 MG tablet Take 1 tablet by mouth every 6 (six) hours.    [provider]  VITAMIN E PO Take 1 capsule by mouth daily. Unknown strength    [provider]  Zinc 50 MG TABS Take 50 mg by mouth in the morning.    [provider]    Family History Family History  Problem Relation Age of Onset   COPD Mother    Heart disease Mother    Breast cancer Mother    Hypertension Mother    Hyperlipidemia Mother  Kidney cancer Father    Heart disease Sister    Liver cancer Brother    Heart disease Maternal Grandmother    Heart disease Maternal Grandfather    Rheum arthritis Maternal Grandfather    Heart disease Paternal Grandmother    Heart disease Paternal Grandfather    Environmental Allergies Son    Environmental Allergies Other    Lupus Other        niece   Diabetes Daughter     Social History Social History[1]   Allergies   Pneumococcal vaccines,  Shellfish protein-containing drug products, Cleocin [clindamycin hcl], Morphine and codeine, Other, Ativan  [lorazepam ], and Buprenorphine hcl   Review of Systems Review of Systems  Constitutional:  Negative for chills and fever.  HENT:  Negative for ear pain and sore throat.   Eyes:  Negative for pain and visual disturbance.  Respiratory:  Positive for cough and shortness of breath.   Cardiovascular:  Negative for chest pain and palpitations.  Gastrointestinal:  Negative for abdominal pain, constipation, diarrhea, nausea and vomiting.  Genitourinary:  Negative for dysuria and hematuria.  Musculoskeletal:  Negative for arthralgias and back pain.  Skin:  Negative for color change and rash.  Neurological:  Negative for seizures and syncope.  All other systems reviewed and are negative.    Physical Exam Triage Vital Signs ED Triage Vitals  Encounter Vitals Group     BP 07/27/24 1156 112/71     Girls Systolic BP Percentile --      Girls Diastolic BP Percentile --      Boys Systolic BP Percentile --      Boys Diastolic BP Percentile --      Pulse Rate 07/27/24 1156 80     Resp 07/27/24 1156 18     Temp 07/27/24 1156 99 F (37.2 C)     Temp Source 07/27/24 1156 Oral     SpO2 07/27/24 1156 95 %     Weight --      Height --      Head Circumference --      Peak Flow --      Pain Score 07/27/24 1154 0     Pain Loc --      Pain Education --      Exclude from Growth Chart --    No data found.  Updated Vital Signs BP 112/71 (BP Location: Right Arm)   Pulse 80   Temp 99 F (37.2 C) (Oral)   Resp 18   SpO2 95%   Visual Acuity Right Eye Distance:   Left Eye Distance:   Bilateral Distance:    Right Eye Near:   Left Eye Near:    Bilateral Near:     Physical Exam Vitals and nursing note reviewed.  Constitutional:      General: She is not in acute distress.    Appearance: She is well-developed. She is not ill-appearing, toxic-appearing or diaphoretic.  HENT:     Head:  Normocephalic and atraumatic.     Right Ear: Hearing, tympanic membrane, ear canal and external ear normal.     Left Ear: Hearing, tympanic membrane, ear canal and external ear normal.     Nose: Congestion and rhinorrhea present. Rhinorrhea is clear.     Right Sinus: No maxillary sinus tenderness or frontal sinus tenderness.     Left Sinus: No maxillary sinus tenderness or frontal sinus tenderness.     Mouth/Throat:     Lips: Pink.     Mouth: Mucous membranes  are moist.     Pharynx: Uvula midline. No oropharyngeal exudate or posterior oropharyngeal erythema.     Tonsils: No tonsillar exudate.  Eyes:     Conjunctiva/sclera: Conjunctivae normal.     Pupils: Pupils are equal, round, and reactive to light.  Cardiovascular:     Rate and Rhythm: Normal rate and regular rhythm.     Heart sounds: S1 normal and S2 normal. No murmur heard. Pulmonary:     Effort: Pulmonary effort is normal. No respiratory distress.     Breath sounds: Examination of the right-upper field reveals wheezing. Examination of the left-upper field reveals wheezing. Examination of the right-lower field reveals decreased breath sounds. Decreased breath sounds and wheezing (Rare inspiratory wheeze) present. No rhonchi or rales.  Abdominal:     General: Bowel sounds are normal.     Palpations: Abdomen is soft.     Tenderness: There is no abdominal tenderness.  Musculoskeletal:        General: No swelling.     Cervical back: Neck supple.  Lymphadenopathy:     Head:     Right side of head: No submental, submandibular, tonsillar, preauricular or posterior auricular adenopathy.     Left side of head: No submental, submandibular, tonsillar, preauricular or posterior auricular adenopathy.     Cervical: No cervical adenopathy.     Right cervical: No superficial cervical adenopathy.    Left cervical: No superficial cervical adenopathy.  Skin:    General: Skin is warm and dry.     Capillary Refill: Capillary refill takes less  than 2 seconds.     Findings: No rash.  Neurological:     Mental Status: She is alert and oriented to person, place, and time.  Psychiatric:        Mood and Affect: Mood normal.      UC Treatments / Results  Labs (all labs ordered are listed, but only abnormal results are displayed) Labs Reviewed - No data to display  EKG   Radiology DG Chest 2 View Result Date: 07/27/2024 EXAM: 2 VIEW(S) XRAY OF THE CHEST 07/27/2024 02:00:04 PM COMPARISON: 12/05/2020. CLINICAL HISTORY: Cough. FINDINGS: LUNGS AND PLEURA: Chronic Linear scarring/atelectasis at left lung base. No pleural effusion. No pneumothorax. HEART AND MEDIASTINUM: Aortic arch calcifications. BONES AND SOFT TISSUES: Thoracolumbar fixation hardware partially visualized. Left axillary surgical clips. IMPRESSION: 1. No acute findings. Electronically signed by: Katheleen Faes MD 07/27/2024 02:39 PM EST RP Workstation: [id number]    Procedures Procedures (including critical care time)  Medications Ordered in UC Medications  triamcinolone  acetonide (KENALOG -40) injection 40 mg (40 mg Intramuscular Given 07/27/24 1433)    Initial Impression / Assessment and Plan / UC Course  I have reviewed the triage vital signs and the nursing notes.  Pertinent labs & imaging results that were available during my care of the patient were reviewed by me and considered in my medical decision making (see chart for details).  Plan of Care (see discharge instructions for additional patient precautions and education): Acute viral bronchitis with cough: Chest x-ray appears negative.  Will update the patient if the radiology review differs.  Complete the Augmentin  as provided by Dr. Theresa.  Kenalog  40 mg injection now.  Patient is a diabetic and will monitor her blood sugars and try to go low-carb with her diet.  Promethazine  DM, 5 mL, every 6 hours if needed for cough.  Continue albuterol  inhaler, 2 puffs every 4 hours if needed for wheezing.  Get  plenty of fluids and rest  Follow-up if symptoms do not improve, worsen or new symptoms occur.  I reviewed the plan of care with the patient and/or the patient's guardian.  The patient and/or guardian had time to ask questions and acknowledged that the questions were answered.  Final Clinical Impressions(s) / UC Diagnoses   Final diagnoses:  Acute cough  Acute viral bronchitis     Discharge Instructions      Acute viral bronchitis with cough: Chest x-ray appears negative.  Will update the patient if the radiology review differs.  Complete the Augmentin  as provided by Dr. Theresa.  Kenalog  40 mg injection now.  Patient is a diabetic and will monitor her blood sugars and try to go low-carb with her diet.  Promethazine  DM, 5 mL, every 6 hours if needed for cough.  Continue albuterol  inhaler, 2 puffs every 4 hours if needed for wheezing.  Get plenty of fluids and rest  Follow-up if symptoms do not improve, worsen or new symptoms occur.     ED Prescriptions     Medication Sig Dispense Auth. Provider   promethazine -dextromethorphan (PROMETHAZINE -DM) 6.25-15 MG/5ML syrup Take 5 mLs by mouth 4 (four) times daily as needed for cough. Do not use and drive - May make drowsy. 118 mL Ival Domino, FNP      PDMP not reviewed this encounter.    [1]  Social History Tobacco Use   Smoking status: Never   Smokeless tobacco: Never   Tobacco comments:    Mother & Father smoked  Vaping Use   Vaping status: Never Used  Substance Use Topics   Alcohol use: No    Alcohol/week: 0.0 standard drinks of alcohol   Drug use: No     Ival Domino, FNP 07/27/24 1450  "

## 2024-07-27 NOTE — ED Triage Notes (Addendum)
 Pt c/o coughing and shortness of breath. She was diagnosed x 1 week ago with Bronchitis she was prescribed Augmentin . Pt has been taken dayquil and Nyquil

## 2024-07-27 NOTE — Discharge Instructions (Addendum)
 Acute viral bronchitis with cough: Chest x-ray appears negative.  Will update the patient if the radiology review differs.  Complete the Augmentin  as provided by Dr. Theresa.  Kenalog  40 mg injection now.  Patient is a diabetic and will monitor her blood sugars and try to go low-carb with her diet.  Promethazine  DM, 5 mL, every 6 hours if needed for cough.  Continue albuterol  inhaler, 2 puffs every 4 hours if needed for wheezing.  Get plenty of fluids and rest  Follow-up if symptoms do not improve, worsen or new symptoms occur.

## 2024-08-29 ENCOUNTER — Ambulatory Visit

## 2025-05-27 ENCOUNTER — Inpatient Hospital Stay

## 2025-05-27 ENCOUNTER — Inpatient Hospital Stay: Admitting: Family
